# Patient Record
Sex: Male | Born: 1970
Health system: Southern US, Community
[De-identification: ages and names within clinical notes are randomized; demographics above are authoritative.]

## PROBLEM LIST (undated history)

## (undated) DIAGNOSIS — R9431 Abnormal electrocardiogram [ECG] [EKG]: Secondary | ICD-10-CM

## (undated) DIAGNOSIS — Z720 Tobacco use: Secondary | ICD-10-CM

## (undated) DIAGNOSIS — N529 Male erectile dysfunction, unspecified: Secondary | ICD-10-CM

## (undated) DIAGNOSIS — G4733 Obstructive sleep apnea (adult) (pediatric): Secondary | ICD-10-CM

## (undated) DIAGNOSIS — Z91199 Patient's noncompliance with other medical treatment and regimen due to unspecified reason: Secondary | ICD-10-CM

## (undated) DIAGNOSIS — I472 Ventricular tachycardia: Secondary | ICD-10-CM

## (undated) DIAGNOSIS — R12 Heartburn: Secondary | ICD-10-CM

## (undated) DIAGNOSIS — E119 Type 2 diabetes mellitus without complications: Secondary | ICD-10-CM

## (undated) DIAGNOSIS — I5032 Chronic diastolic (congestive) heart failure: Secondary | ICD-10-CM

## (undated) DIAGNOSIS — I10 Essential (primary) hypertension: Secondary | ICD-10-CM

## (undated) DIAGNOSIS — E039 Hypothyroidism, unspecified: Secondary | ICD-10-CM

## (undated) DIAGNOSIS — E785 Hyperlipidemia, unspecified: Secondary | ICD-10-CM

## (undated) DIAGNOSIS — I2510 Atherosclerotic heart disease of native coronary artery without angina pectoris: Secondary | ICD-10-CM

## (undated) DIAGNOSIS — I219 Acute myocardial infarction, unspecified: Secondary | ICD-10-CM

## (undated) DIAGNOSIS — Z9111 Patient's noncompliance with dietary regimen: Secondary | ICD-10-CM

## (undated) DIAGNOSIS — F1921 Other psychoactive substance dependence, in remission: Secondary | ICD-10-CM

## (undated) DIAGNOSIS — N189 Chronic kidney disease, unspecified: Secondary | ICD-10-CM

## (undated) DIAGNOSIS — E1169 Type 2 diabetes mellitus with other specified complication: Secondary | ICD-10-CM

## (undated) HISTORY — DX: Chronic diastolic (congestive) heart failure: I50.32

## (undated) HISTORY — DX: Obstructive sleep apnea (adult) (pediatric): G47.33

## (undated) HISTORY — DX: Hypothyroidism, unspecified: E03.9

## (undated) HISTORY — DX: Patient's noncompliance with other medical treatment and regimen due to unspecified reason: Z91.199

## (undated) HISTORY — DX: Patient's noncompliance with dietary regimen: Z91.11

## (undated) HISTORY — PX: APPENDECTOMY: SHX54

## (undated) HISTORY — PX: CORONARY STENT PLACEMENT: SHX1402

## (undated) HISTORY — DX: Type 2 diabetes mellitus without complications: E11.9

## (undated) HISTORY — DX: Morbid (severe) obesity due to excess calories: E66.01

## (undated) HISTORY — DX: Tobacco use: Z72.0

---

## 2009-07-30 DIAGNOSIS — I252 Old myocardial infarction: Secondary | ICD-10-CM | POA: Diagnosis present

## 2009-07-30 DIAGNOSIS — I219 Acute myocardial infarction, unspecified: Secondary | ICD-10-CM

## 2009-07-30 HISTORY — DX: Acute myocardial infarction, unspecified: I21.9

## 2015-09-15 ENCOUNTER — Inpatient Hospital Stay (HOSPITAL_BASED_OUTPATIENT_CLINIC_OR_DEPARTMENT_OTHER)
Admission: EM | Admit: 2015-09-15 | Discharge: 2015-09-17 | DRG: 247 | Disposition: A | Discharge status: Home / Self Care | Payer: Medicaid Other | Attending: Internal Medicine | Admitting: Internal Medicine

## 2015-09-15 ENCOUNTER — Other Ambulatory Visit: Payer: Self-pay

## 2015-09-15 ENCOUNTER — Emergency Department (HOSPITAL_BASED_OUTPATIENT_CLINIC_OR_DEPARTMENT_OTHER): Payer: Medicaid Other

## 2015-09-15 ENCOUNTER — Encounter (HOSPITAL_BASED_OUTPATIENT_CLINIC_OR_DEPARTMENT_OTHER): Payer: Self-pay | Admitting: *Deleted

## 2015-09-15 DIAGNOSIS — I25110 Atherosclerotic heart disease of native coronary artery with unstable angina pectoris: Secondary | ICD-10-CM | POA: Diagnosis present

## 2015-09-15 DIAGNOSIS — Y831 Surgical operation with implant of artificial internal device as the cause of abnormal reaction of the patient, or of later complication, without mention of misadventure at the time of the procedure: Secondary | ICD-10-CM | POA: Diagnosis present

## 2015-09-15 DIAGNOSIS — M1990 Unspecified osteoarthritis, unspecified site: Secondary | ICD-10-CM | POA: Diagnosis present

## 2015-09-15 DIAGNOSIS — Z955 Presence of coronary angioplasty implant and graft: Secondary | ICD-10-CM

## 2015-09-15 DIAGNOSIS — E785 Hyperlipidemia, unspecified: Secondary | ICD-10-CM | POA: Insufficient documentation

## 2015-09-15 DIAGNOSIS — E1169 Type 2 diabetes mellitus with other specified complication: Secondary | ICD-10-CM | POA: Diagnosis present

## 2015-09-15 DIAGNOSIS — Z7902 Long term (current) use of antithrombotics/antiplatelets: Secondary | ICD-10-CM

## 2015-09-15 DIAGNOSIS — I252 Old myocardial infarction: Secondary | ICD-10-CM | POA: Diagnosis present

## 2015-09-15 DIAGNOSIS — R0789 Other chest pain: Secondary | ICD-10-CM

## 2015-09-15 DIAGNOSIS — E119 Type 2 diabetes mellitus without complications: Secondary | ICD-10-CM

## 2015-09-15 DIAGNOSIS — R079 Chest pain, unspecified: Secondary | ICD-10-CM | POA: Diagnosis present

## 2015-09-15 DIAGNOSIS — I10 Essential (primary) hypertension: Secondary | ICD-10-CM | POA: Diagnosis present

## 2015-09-15 DIAGNOSIS — K219 Gastro-esophageal reflux disease without esophagitis: Secondary | ICD-10-CM | POA: Diagnosis present

## 2015-09-15 DIAGNOSIS — F17210 Nicotine dependence, cigarettes, uncomplicated: Secondary | ICD-10-CM | POA: Diagnosis present

## 2015-09-15 DIAGNOSIS — I209 Angina pectoris, unspecified: Secondary | ICD-10-CM

## 2015-09-15 DIAGNOSIS — Z7984 Long term (current) use of oral hypoglycemic drugs: Secondary | ICD-10-CM

## 2015-09-15 DIAGNOSIS — Z7982 Long term (current) use of aspirin: Secondary | ICD-10-CM

## 2015-09-15 DIAGNOSIS — Z6841 Body Mass Index (BMI) 40.0 and over, adult: Secondary | ICD-10-CM

## 2015-09-15 DIAGNOSIS — T82855A Stenosis of coronary artery stent, initial encounter: Principal | ICD-10-CM | POA: Diagnosis present

## 2015-09-15 HISTORY — DX: Essential (primary) hypertension: I10

## 2015-09-15 HISTORY — DX: Type 2 diabetes mellitus with other specified complication: E78.5

## 2015-09-15 HISTORY — DX: Acute myocardial infarction, unspecified: I21.9

## 2015-09-15 HISTORY — DX: Atherosclerotic heart disease of native coronary artery without angina pectoris: I25.10

## 2015-09-15 HISTORY — DX: Type 2 diabetes mellitus with other specified complication: E11.69

## 2015-09-15 LAB — BASIC METABOLIC PANEL
Anion gap: 12 (ref 5–15)
BUN: 11 mg/dL (ref 6–20)
CALCIUM: 8.8 mg/dL — AB (ref 8.9–10.3)
CO2: 21 mmol/L — ABNORMAL LOW (ref 22–32)
Chloride: 103 mmol/L (ref 101–111)
Creatinine, Ser: 0.77 mg/dL (ref 0.61–1.24)
GFR calc Af Amer: >60 mL/min (ref >60)
GLUCOSE: 374 mg/dL — AB (ref 65–99)
Potassium: 3.7 mmol/L (ref 3.5–5.1)
Sodium: 136 mmol/L (ref 135–145)

## 2015-09-15 LAB — CBC
HEMATOCRIT: 43.8 % (ref 39.0–52.0)
Hemoglobin: 14.9 g/dL (ref 13.0–17.0)
MCH: 31 pg (ref 26.0–34.0)
MCHC: 34 g/dL (ref 30.0–36.0)
MCV: 91.1 fL (ref 78.0–100.0)
Platelets: 209 10*3/uL (ref 150–400)
RBC: 4.81 MIL/uL (ref 4.22–5.81)
RDW: 13.6 % (ref 11.5–15.5)
WBC: 12.8 10*3/uL — ABNORMAL HIGH (ref 4.0–10.5)

## 2015-09-15 LAB — GLUCOSE, CAPILLARY: GLUCOSE-CAPILLARY: 296 mg/dL — AB (ref 65–99)

## 2015-09-15 LAB — TROPONIN I

## 2015-09-15 MED ORDER — PNEUMOCOCCAL VAC POLYVALENT 25 MCG/0.5ML IJ INJ
0.5000 mL | INJECTION | INTRAMUSCULAR | Status: AC
  Administered 2015-09-16: 0.5 mL via INTRAMUSCULAR
  Filled 2015-09-15: qty 0.5

## 2015-09-15 MED ORDER — INFLUENZA VAC SPLIT QUAD 0.5 ML IM SUSY
0.5000 mL | PREFILLED_SYRINGE | INTRAMUSCULAR | Status: AC
  Administered 2015-09-16: 0.5 mL via INTRAMUSCULAR
  Filled 2015-09-15: qty 0.5

## 2015-09-15 MED ORDER — NITROGLYCERIN 0.4 MG SL SUBL
0.4000 mg | SUBLINGUAL_TABLET | SUBLINGUAL | Status: DC | PRN
  Administered 2015-09-15: 0.4 mg via SUBLINGUAL
  Filled 2015-09-15: qty 1

## 2015-09-15 MED ORDER — ASPIRIN 325 MG PO TABS
325.0000 mg | ORAL_TABLET | Freq: Once | ORAL | Status: DC
  Filled 2015-09-15: qty 1

## 2015-09-15 MED ORDER — ASPIRIN 81 MG PO CHEW
324.0000 mg | CHEWABLE_TABLET | Freq: Once | ORAL | Status: AC
  Administered 2015-09-15: 324 mg via ORAL
  Filled 2015-09-15: qty 4

## 2015-09-15 NOTE — ED Notes (Signed)
MD at bedside. 

## 2015-09-15 NOTE — ED Notes (Signed)
Pt reports chest pain with onset approx 25 mins ago. Pt has hx of MI x 3 and has 6 stents. States pain is consistent with past heart attacks

## 2015-09-15 NOTE — Progress Notes (Signed)
[facility] Discussed case with Dr. Clayborne DanaMesner. [name] is a 45 year old male with a past medical history of HTN, HLD, DM, CAD s/p PCI; who presents with acute onset of left-sided chest pain/pressure that waxed and waned. Initial EKG showing sinus tachycardia with nonspecific ST changes,  troponin negative, and patient with abnormal chest x-ray. Admitted to a telemetry bed for further evaluation vitals otherwise stable at this time.

## 2015-09-16 ENCOUNTER — Encounter (HOSPITAL_COMMUNITY): Payer: Self-pay | Admitting: *Deleted

## 2015-09-16 ENCOUNTER — Encounter (HOSPITAL_COMMUNITY)
Admission: EM | Disposition: A | Discharge status: Home / Self Care | Payer: Self-pay | Source: Home / Self Care | Attending: Internal Medicine

## 2015-09-16 DIAGNOSIS — I25110 Atherosclerotic heart disease of native coronary artery with unstable angina pectoris: Secondary | ICD-10-CM

## 2015-09-16 DIAGNOSIS — E785 Hyperlipidemia, unspecified: Secondary | ICD-10-CM | POA: Insufficient documentation

## 2015-09-16 DIAGNOSIS — R079 Chest pain, unspecified: Secondary | ICD-10-CM

## 2015-09-16 DIAGNOSIS — E1169 Type 2 diabetes mellitus with other specified complication: Secondary | ICD-10-CM | POA: Diagnosis present

## 2015-09-16 DIAGNOSIS — I10 Essential (primary) hypertension: Secondary | ICD-10-CM | POA: Diagnosis present

## 2015-09-16 HISTORY — PX: CARDIAC CATHETERIZATION: SHX172

## 2015-09-16 LAB — GLUCOSE, CAPILLARY
GLUCOSE-CAPILLARY: 211 mg/dL — AB (ref 65–99)
GLUCOSE-CAPILLARY: 131 mg/dL — AB (ref 65–99)
GLUCOSE-CAPILLARY: 107 mg/dL — AB (ref 65–99)
Glucose-Capillary: 235 mg/dL — ABNORMAL HIGH (ref 65–99)

## 2015-09-16 LAB — BASIC METABOLIC PANEL
Anion gap: 11 (ref 5–15)
BUN: 9 mg/dL (ref 6–20)
CHLORIDE: 106 mmol/L (ref 101–111)
CO2: 23 mmol/L (ref 22–32)
Calcium: 8.6 mg/dL — ABNORMAL LOW (ref 8.9–10.3)
Creatinine, Ser: 0.71 mg/dL (ref 0.61–1.24)
GFR calc Af Amer: >60 mL/min (ref >60)
GFR calc non Af Amer: >60 mL/min (ref >60)
Glucose, Bld: 275 mg/dL — ABNORMAL HIGH (ref 65–99)
POTASSIUM: 3.9 mmol/L (ref 3.5–5.1)
SODIUM: 140 mmol/L (ref 135–145)

## 2015-09-16 LAB — CBC
HEMATOCRIT: 40.2 % (ref 39.0–52.0)
Hemoglobin: 13.7 g/dL (ref 13.0–17.0)
MCH: 31.4 pg (ref 26.0–34.0)
MCHC: 34.1 g/dL (ref 30.0–36.0)
MCV: 92.2 fL (ref 78.0–100.0)
Platelets: 170 10*3/uL (ref 150–400)
RBC: 4.36 MIL/uL (ref 4.22–5.81)
RDW: 13.5 % (ref 11.5–15.5)
WBC: 11.4 10*3/uL — AB (ref 4.0–10.5)

## 2015-09-16 LAB — LIPID PANEL
CHOL/HDL RATIO: 3.9 ratio
CHOLESTEROL: 163 mg/dL (ref 0–200)
HDL: 42 mg/dL (ref >40)
LDL Cholesterol: 87 mg/dL (ref 0–99)
Triglycerides: 168 mg/dL — ABNORMAL HIGH (ref <150)
VLDL: 34 mg/dL (ref 0–40)

## 2015-09-16 LAB — PROTIME-INR
INR: 1.02 (ref 0.00–1.49)
Prothrombin Time: 13.6 seconds (ref 11.6–15.2)

## 2015-09-16 LAB — TROPONIN I: Troponin I: 0.03 ng/mL (ref <0.031)

## 2015-09-16 SURGERY — LEFT HEART CATH AND CORONARY ANGIOGRAPHY

## 2015-09-16 MED ORDER — SODIUM CHLORIDE 0.9 % IV SOLN
1.7500 mg/kg/h | INTRAVENOUS | Status: AC
  Administered 2015-09-16: 17:00:00 1.75 mg/kg/h via INTRAVENOUS
  Filled 2015-09-16: qty 250

## 2015-09-16 MED ORDER — NITROGLYCERIN 1 MG/10 ML FOR IR/CATH LAB
INTRA_ARTERIAL | Status: AC
  Filled 2015-09-16: qty 10

## 2015-09-16 MED ORDER — ALUM & MAG HYDROXIDE-SIMETH 200-200-20 MG/5ML PO SUSP
30.0000 mL | Freq: Four times a day (QID) | ORAL | Status: DC | PRN
Start: 2015-09-16 — End: 2015-09-17
  Administered 2015-09-16: 30 mL via ORAL
  Filled 2015-09-16: qty 30

## 2015-09-16 MED ORDER — NICOTINE 21 MG/24HR TD PT24
21.0000 mg | MEDICATED_PATCH | Freq: Every day | TRANSDERMAL | Status: DC
  Administered 2015-09-16 – 2015-09-17 (×2): 21 mg via TRANSDERMAL
  Filled 2015-09-16 (×2): qty 1

## 2015-09-16 MED ORDER — CLOPIDOGREL BISULFATE 75 MG PO TABS
75.0000 mg | ORAL_TABLET | Freq: Every day | ORAL | Status: DC
  Administered 2015-09-16: 75 mg via ORAL
  Filled 2015-09-16: qty 1

## 2015-09-16 MED ORDER — ONDANSETRON HCL 4 MG/2ML IJ SOLN: 4.0000 mg | Freq: Four times a day (QID) | INTRAMUSCULAR | Status: DC | PRN

## 2015-09-16 MED ORDER — HYDRALAZINE HCL 20 MG/ML IJ SOLN
INTRAMUSCULAR | Status: AC
  Filled 2015-09-16: qty 1

## 2015-09-16 MED ORDER — ATROPINE SULFATE 0.1 MG/ML IJ SOLN
INTRAMUSCULAR | Status: AC
  Filled 2015-09-16: qty 10

## 2015-09-16 MED ORDER — SODIUM CHLORIDE 0.9 % IV SOLN
250.0000 mg | INTRAVENOUS | Status: DC | PRN
  Administered 2015-09-16 (×2): 1.75 mg/kg/h via INTRAVENOUS

## 2015-09-16 MED ORDER — ASPIRIN 81 MG PO CHEW
324.0000 mg | CHEWABLE_TABLET | ORAL | Status: AC
  Administered 2015-09-16: 324 mg via ORAL
  Filled 2015-09-16: qty 4

## 2015-09-16 MED ORDER — ONDANSETRON HCL 4 MG/2ML IJ SOLN: 4.0000 mg | Freq: Four times a day (QID) | INTRAMUSCULAR | Status: DC

## 2015-09-16 MED ORDER — NITROGLYCERIN IN D5W 200-5 MCG/ML-% IV SOLN
INTRAVENOUS | Status: DC | PRN
  Administered 2015-09-16: 10 ug/min via INTRAVENOUS

## 2015-09-16 MED ORDER — NITROGLYCERIN IN D5W 200-5 MCG/ML-% IV SOLN
2.0000 ug/min | INTRAVENOUS | Status: DC
  Administered 2015-09-16: 20 ug/min via INTRAVENOUS

## 2015-09-16 MED ORDER — METOPROLOL TARTRATE 25 MG PO TABS
50.0000 mg | ORAL_TABLET | Freq: Two times a day (BID) | ORAL | Status: DC
  Administered 2015-09-16 – 2015-09-17 (×4): 50 mg via ORAL
  Filled 2015-09-16: qty 1
  Filled 2015-09-16: qty 2
  Filled 2015-09-16: qty 1
  Filled 2015-09-16: qty 2

## 2015-09-16 MED ORDER — SODIUM CHLORIDE 0.9 % WEIGHT BASED INFUSION
3.0000 mL/kg/h | INTRAVENOUS | Status: DC
  Administered 2015-09-16: 3 mL/kg/h via INTRAVENOUS

## 2015-09-16 MED ORDER — HYDRALAZINE HCL 20 MG/ML IJ SOLN
10.0000 mg | INTRAMUSCULAR | Status: DC | PRN
  Administered 2015-09-16: 10 mg via INTRAVENOUS
  Filled 2015-09-16: qty 1

## 2015-09-16 MED ORDER — CLOPIDOGREL BISULFATE 75 MG PO TABS
75.0000 mg | ORAL_TABLET | Freq: Every day | ORAL | Status: DC
  Administered 2015-09-17: 75 mg via ORAL
  Filled 2015-09-16: qty 1

## 2015-09-16 MED ORDER — INSULIN GLARGINE 100 UNIT/ML ~~LOC~~ SOLN
10.0000 [IU] | Freq: Every day | SUBCUTANEOUS | Status: DC
Start: 2015-09-16 — End: 2015-09-17
  Administered 2015-09-17: 10 [IU] via SUBCUTANEOUS
  Filled 2015-09-16 (×2): qty 0.1

## 2015-09-16 MED ORDER — ENOXAPARIN SODIUM 40 MG/0.4ML ~~LOC~~ SOLN
40.0000 mg | Freq: Every day | SUBCUTANEOUS | Status: DC
  Administered 2015-09-17: 09:00:00 40 mg via SUBCUTANEOUS
  Filled 2015-09-16: qty 0.4

## 2015-09-16 MED ORDER — LIDOCAINE HCL (PF) 1 % IJ SOLN
INTRAMUSCULAR | Status: AC
  Filled 2015-09-16: qty 30

## 2015-09-16 MED ORDER — ANGIOPLASTY BOOK
Freq: Once | Status: AC
  Administered 2015-09-16: 20:00:00
  Filled 2015-09-16: qty 1

## 2015-09-16 MED ORDER — CLOPIDOGREL BISULFATE 300 MG PO TABS
ORAL_TABLET | ORAL | Status: DC | PRN
  Administered 2015-09-16: 300 mg via ORAL

## 2015-09-16 MED ORDER — HEPARIN (PORCINE) IN NACL 2-0.9 UNIT/ML-% IJ SOLN
INTRAMUSCULAR | Status: AC
  Filled 2015-09-16: qty 1500

## 2015-09-16 MED ORDER — HYDRALAZINE HCL 20 MG/ML IJ SOLN
INTRAMUSCULAR | Status: DC | PRN
Start: 2015-09-16 — End: 2015-09-16
  Administered 2015-09-16: 10 mg via INTRAVENOUS

## 2015-09-16 MED ORDER — SODIUM CHLORIDE 0.9 % IV SOLN: 250.0000 mL | INTRAVENOUS | Status: DC | PRN

## 2015-09-16 MED ORDER — ACETAMINOPHEN 325 MG PO TABS
650.0000 mg | ORAL_TABLET | ORAL | Status: DC | PRN
  Administered 2015-09-16 (×2): 650 mg via ORAL
  Filled 2015-09-16 (×2): qty 2

## 2015-09-16 MED ORDER — SODIUM CHLORIDE 0.9% FLUSH: 3.0000 mL | INTRAVENOUS | Status: DC | PRN

## 2015-09-16 MED ORDER — NITROGLYCERIN IN D5W 200-5 MCG/ML-% IV SOLN
INTRAVENOUS | Status: AC
Start: 2015-09-16 — End: 2015-09-16
  Filled 2015-09-16: qty 250

## 2015-09-16 MED ORDER — CLOPIDOGREL BISULFATE 300 MG PO TABS
ORAL_TABLET | ORAL | Status: AC
  Filled 2015-09-16: qty 1

## 2015-09-16 MED ORDER — BIVALIRUDIN BOLUS VIA INFUSION - CUPID
INTRAVENOUS | Status: DC | PRN
Start: 2015-09-16 — End: 2015-09-16
  Administered 2015-09-16: 83.325 mg via INTRAVENOUS

## 2015-09-16 MED ORDER — LISINOPRIL 5 MG PO TABS
5.0000 mg | ORAL_TABLET | Freq: Every day | ORAL | Status: DC
  Administered 2015-09-16: 5 mg via ORAL
  Filled 2015-09-16: qty 1

## 2015-09-16 MED ORDER — SODIUM CHLORIDE 0.9 % WEIGHT BASED INFUSION: 1.0000 mL/kg/h | INTRAVENOUS | Status: AC

## 2015-09-16 MED ORDER — ASPIRIN 81 MG PO CHEW
81.0000 mg | CHEWABLE_TABLET | Freq: Every day | ORAL | Status: DC
  Administered 2015-09-17: 81 mg via ORAL
  Filled 2015-09-16: qty 1

## 2015-09-16 MED ORDER — SODIUM CHLORIDE 0.9 % WEIGHT BASED INFUSION
1.0000 mL/kg/h | INTRAVENOUS | Status: DC
  Administered 2015-09-16: 1 mL/kg/h via INTRAVENOUS

## 2015-09-16 MED ORDER — BIVALIRUDIN 250 MG IV SOLR
INTRAVENOUS | Status: AC
  Filled 2015-09-16: qty 250

## 2015-09-16 MED ORDER — ASPIRIN EC 81 MG PO TBEC
81.0000 mg | DELAYED_RELEASE_TABLET | Freq: Every day | ORAL | Status: DC
  Administered 2015-09-16: 81 mg via ORAL
  Filled 2015-09-16: qty 1

## 2015-09-16 MED ORDER — LIDOCAINE HCL (PF) 1 % IJ SOLN
INTRAMUSCULAR | Status: DC | PRN
  Administered 2015-09-16: 25 mL

## 2015-09-16 MED ORDER — ISOSORBIDE MONONITRATE ER 60 MG PO TB24
60.0000 mg | ORAL_TABLET | Freq: Every day | ORAL | Status: DC
  Administered 2015-09-16 – 2015-09-17 (×2): 60 mg via ORAL
  Filled 2015-09-16 (×2): qty 1

## 2015-09-16 MED ORDER — HEPARIN (PORCINE) IN NACL 2-0.9 UNIT/ML-% IJ SOLN
INTRAMUSCULAR | Status: DC | PRN
  Administered 2015-09-16: 1500 mL

## 2015-09-16 MED ORDER — VERAPAMIL HCL 2.5 MG/ML IV SOLN
INTRAVENOUS | Status: AC
  Filled 2015-09-16: qty 2

## 2015-09-16 MED ORDER — SODIUM CHLORIDE 0.9% FLUSH: 3.0000 mL | Freq: Two times a day (BID) | INTRAVENOUS | Status: DC

## 2015-09-16 MED ORDER — MORPHINE SULFATE (PF) 2 MG/ML IV SOLN
2.0000 mg | INTRAVENOUS | Status: DC | PRN
  Administered 2015-09-16: 2 mg via INTRAVENOUS
  Filled 2015-09-16: qty 1

## 2015-09-16 MED ORDER — INSULIN ASPART 100 UNIT/ML ~~LOC~~ SOLN
0.0000 [IU] | Freq: Three times a day (TID) | SUBCUTANEOUS | Status: DC
Start: 2015-09-16 — End: 2015-09-17
  Administered 2015-09-16 (×2): 3 [IU] via SUBCUTANEOUS
  Administered 2015-09-16: 19:00:00 1 [IU] via SUBCUTANEOUS
  Administered 2015-09-17: 3 [IU] via SUBCUTANEOUS
  Administered 2015-09-17: 5 [IU] via SUBCUTANEOUS

## 2015-09-16 MED ORDER — SODIUM CHLORIDE 0.9% FLUSH
3.0000 mL | Freq: Two times a day (BID) | INTRAVENOUS | Status: DC
  Administered 2015-09-16 – 2015-09-17 (×3): 3 mL via INTRAVENOUS

## 2015-09-16 MED ORDER — IOHEXOL 350 MG/ML SOLN
INTRAVENOUS | Status: DC | PRN
  Administered 2015-09-16: 220 mL via INTRA_ARTERIAL

## 2015-09-16 MED ORDER — ATORVASTATIN CALCIUM 10 MG PO TABS
10.0000 mg | ORAL_TABLET | Freq: Every day | ORAL | Status: DC
  Administered 2015-09-16: 10 mg via ORAL
  Filled 2015-09-16: qty 1

## 2015-09-16 MED ORDER — LIVING WELL WITH DIABETES BOOK
Freq: Once | Status: AC
  Administered 2015-09-16: 20:00:00
  Filled 2015-09-16: qty 1

## 2015-09-16 SURGICAL SUPPLY — 25 items
BALLN ANGIOSCULPT RX 2.0X10 (BALLOONS) ×2
BALLN EMERGE MR 2.0X12 (BALLOONS) ×2
BALLN ~~LOC~~ TREK RX 2.75X15 (BALLOONS) ×2
BALLN ~~LOC~~ TREK RX 3.5X15 (BALLOONS) ×2
BALLOON ANGIOSCULPT RX 2.0X10 (BALLOONS) ×1 IMPLANT
BALLOON EMERGE MR 2.0X12 (BALLOONS) ×1 IMPLANT
BALLOON ~~LOC~~ TREK RX 2.75X15 (BALLOONS) ×1 IMPLANT
BALLOON ~~LOC~~ TREK RX 3.5X15 (BALLOONS) ×1 IMPLANT
CATH INFINITI 5FR MULTPACK ANG (CATHETERS) ×2 IMPLANT
CATH VISTA GUIDE 6FR JR4 (CATHETERS) ×2 IMPLANT
CATH VISTA GUIDE 6FR XBLAD3.5 (CATHETERS) ×2 IMPLANT
GLIDESHEATH SLEND A-KIT 6F 22G (SHEATH) ×2 IMPLANT
KIT ENCORE 26 ADVANTAGE (KITS) ×2 IMPLANT
KIT HEART LEFT (KITS) ×2 IMPLANT
PACK CARDIAC CATHETERIZATION (CUSTOM PROCEDURE TRAY) ×2 IMPLANT
SHEATH PINNACLE 5F 10CM (SHEATH) ×4 IMPLANT
SHEATH PINNACLE 6F 10CM (SHEATH) ×2 IMPLANT
STENT SYNERGY DES 2.5X20 (Permanent Stent) ×2 IMPLANT
STENT XIENCE ALPINE RX 3.25X18 (Permanent Stent) ×2 IMPLANT
SYR MEDRAD MARK V 150ML (SYRINGE) ×2 IMPLANT
TRANSDUCER W/STOPCOCK (MISCELLANEOUS) ×2 IMPLANT
TUBING CIL FLEX 10 FLL-RA (TUBING) ×2 IMPLANT
WIRE ASAHI PROWATER 180CM (WIRE) ×2 IMPLANT
WIRE EMERALD 3MM-J .035X150CM (WIRE) ×4 IMPLANT
WIRE SAFE-T 1.5MM-J .035X260CM (WIRE) ×2 IMPLANT

## 2015-09-16 NOTE — Progress Notes (Signed)
Paged fellow Onalee HuaAlvarez regarding pt home arthritis medication, states to hold med tonight and give tylenol; will address in the morning. Informed pt c/o right hand pain; pt states r/t arthritis. Radial site checked. Level 0. Dressing dry and intact.

## 2015-09-16 NOTE — Interval H&P Note (Signed)
Cath Lab Visit (complete for each Cath Lab visit)  Clinical Evaluation Leading to the Procedure:   ACS: Yes.    Non-ACS:    Anginal Classification: CCS III  Anti-ischemic medical therapy: Minimal Therapy (1 class of medications)  Non-Invasive Test Results: No non-invasive testing performed  Prior CABG: No previous CABG      History and Physical Interval Note:  09/16/2015 1:55 PM  Burna MortimerJames Jollie  has presented today for surgery, with the diagnosis of cp  The various methods of treatment have been discussed with the patient and family. After consideration of risks, benefits and other options for treatment, the patient has consented to  Procedure(s): Left Heart Cath and Coronary Angiography (N/A) as a surgical intervention .  The patient's history has been reviewed, patient examined, no change in status, stable for surgery.  I have reviewed the patient's chart and labs.  Questions were answered to the patient's satisfaction.     Nanetta BattyBerry, Estle Huguley

## 2015-09-16 NOTE — Progress Notes (Signed)
[facility]                                                                              Patient Demographics  Marc Walker, is a 45 y.o. male, DOB - 04/20/1971, ZOX:[id number]  Admit date - 09/15/2015   Admitting Physician Clydie Braunondell A Smith, MD  Outpatient Primary MD for the patient is No PCP Per Patient  LOS -   days    Chief Complaint  Patient presents with  . Chest Pain       Brief HPI   Patient is a 45 year old male with CAD, MI last in September 2015, per patient. PCI, 6 stents, hypertension hyperlipidemia, diabetes, tobacco abuse presented with chest pain. Patient aborted as chest pain substernal, exertional, intermittent for a week. No recent cardiac workup.   Assessment & Plan    Active Problems: Chest pain, concerning for angina - Cardiac enzymes negative so far, currently chest pain resolved - Continue aspirin, statin, Imdur, beta blocker, Plavix - Cardiology consulted, likely needs cardiac cath  Hypertension - Continue Imdur, lisinopril, metoprolol  Hyperlipidemia - Lipid panel showed LDL 87, triglycerides 160, continue statin  Diabetes mellitus type 2 - Uncontrolled, obtain hemoglobin A1c - For now place on Lantus 10 units, continue sliding scale insulin, patient is NPO  Tobacco abuse Patient counseled strongly on tobacco cessation, placed on nicotine patch   Code Status: Full CODE STATUS  Family Communication: Discussed in detail with the patient, all imaging results, lab results explained to the patient    Disposition Plan:  Time Spent in minutes   25 minutes  Procedures    Consults   cardiology  DVT Prophylaxis  Lovenox   Medications  Scheduled Meds: . aspirin EC  81 mg Oral Daily  . atorvastatin  10 mg Oral Daily  . clopidogrel  75 mg Oral Daily  . enoxaparin (LOVENOX) injection  40 mg Subcutaneous Daily  . insulin aspart  0-9 Units Subcutaneous TID WC  . isosorbide mononitrate  60 mg Oral Daily  . lisinopril   5 mg Oral Daily  . metoprolol  50 mg Oral BID  . nicotine  21 mg Transdermal Daily  . sodium chloride flush  3 mL Intravenous Q12H   Continuous Infusions: . sodium chloride     PRN Meds:.sodium chloride, alum & mag hydroxide-simeth, nitroGLYCERIN, ondansetron (ZOFRAN) IV, sodium chloride flush   Antibiotics   Anti-infectives    None        Subjective:   Marc Walker was seen and examined today.  Patient denies dizziness, chest pain, shortness of breath, abdominal pain, N/V/D/C, new weakness, numbess, tingling. No acute events overnight.    Objective:   Filed Vitals:   09/15/15 2321 09/16/15 0000 09/16/15 0500 09/16/15 0751  BP: 185/86 165/83 146/80 148/92  Pulse: 84  64 59  Temp: 98.7 F (37.1 C)  98.2 F (36.8 C) 98 F (36.7 C)  TempSrc: Oral   Oral  Resp: 18  24 15   Height: 5\' 5"  (1.651 m)     Weight: 112.265 kg (247 lb 8 oz)  111.131 kg (245 lb)   SpO2: 97%  94% 94%  Intake/Output Summary (Last 24 hours) at 09/16/15 1142 Last data filed at 09/16/15 0841  Gross per 24 hour  Intake    240 ml  Output    400 ml  Net   -160 ml     Wt Readings from Last 3 Encounters:  09/16/15 111.131 kg (245 lb)     Exam  General: Alert and oriented x 3, NAD  HEENT:  PERRLA, EOMI, Anicteric Sclera, mucous membranes moist.   Neck: Supple, no JVD, no masses  CVS: S1 S2 auscultated, no rubs, murmurs or gallops. Regular rate and rhythm.  Respiratory: Clear to auscultation bilaterally, no wheezing, rales or rhonchi  Abdomen: Soft, nontender, nondistended, + bowel sounds  Ext: no cyanosis clubbing or edema  Neuro: AAOx3, Cr N's II- XII. Strength 5/5 upper and lower extremities bilaterally  Skin: No rashes  Psych: Normal affect and demeanor, alert and oriented x3    Data Reviewed:  I have personally reviewed following labs and imaging studies  Micro Results No results found for this or any previous visit (from the past 240 hour(s)).  Radiology Reports Dg  Chest Portable 1 View  09/15/2015  CLINICAL DATA:  sternal chest pain history of myocardial infarction EXAM: PORTABLE CHEST 1 VIEW COMPARISON:  None. FINDINGS: Mild cardiac enlargement. Vascular pattern normal. No consolidation or effusion. Mild perihilar peribronchial wall thickening. Mild interstitial prominence. Small nodular opacity right lung base could represent discoid atelectasis. IMPRESSION: Mild cardiac silhouette enlargement. Mild diffuse interstitial prominence without pulmonary venous hypertension. No definite pulmonary edema. Possible mild interstitial lung disease. Mild nodular opacity right lung base possibly discoid atelectasis given young age of the patient. Consider short-term follow-up PA and lateral chest radiograph in 2-4 weeks to ensure resolution. Electronically Signed   By: Esperanza Heir M.D.   On: 09/15/2015 20:22    CBC  Recent Labs Lab 09/15/15 1925 09/16/15 0637  WBC 12.8* 11.4*  HGB 14.9 13.7  HCT 43.8 40.2  PLT 209 170  MCV 91.1 92.2  MCH 31.0 31.4  MCHC 34.0 34.1  RDW 13.6 13.5    Chemistries   Recent Labs Lab 09/15/15 1925 09/16/15 0637  NA 136 140  K 3.7 3.9  CL 103 106  CO2 21* 23  GLUCOSE 374* 275*  BUN 11 9  CREATININE 0.77 0.71  CALCIUM 8.8* 8.6*   ------------------------------------------------------------------------------------------------------------------ estimated creatinine clearance is 132.7 mL/min (by C-G formula based on Cr of 0.71). ------------------------------------------------------------------------------------------------------------------ No results for input(s): HGBA1C in the last 72 hours. ------------------------------------------------------------------------------------------------------------------  Recent Labs  09/16/15 0930  CHOL 163  HDL 42  LDLCALC 87  TRIG 168*  CHOLHDL 3.9   ------------------------------------------------------------------------------------------------------------------ No  results for input(s): TSH, T4TOTAL, T3FREE, THYROIDAB in the last 72 hours.  Invalid input(s): FREET3 ------------------------------------------------------------------------------------------------------------------ No results for input(s): VITAMINB12, FOLATE, FERRITIN, TIBC, IRON, RETICCTPCT in the last 72 hours.  Coagulation profile  Recent Labs Lab 09/16/15 1030  INR 1.02    No results for input(s): DDIMER in the last 72 hours.  Cardiac Enzymes  Recent Labs Lab 09/15/15 1925 09/16/15 0003 09/16/15 0613  TROPONINI <0.03 0.03 <0.03   ------------------------------------------------------------------------------------------------------------------ Invalid input(s): POCBNP   Recent Labs  09/15/15 2324 09/16/15 0750  GLUCAP 296* 235*     Oretta Berkland M.D. [facility] 09/16/2015, 11:42 AM  Pager: [phone] Between 7am to 7pm - call Pager - [phone]  After 7pm go to [email or web address] - password [email or web address]  Call night coverage person covering after 7pm

## 2015-09-16 NOTE — Progress Notes (Addendum)
Inpatient Diabetes Program Recommendations  AACE/ADA: New Consensus Statement on Inpatient Glycemic Control (2015)  Target Ranges:  Prepandial:   less than 140 mg/dL      Peak postprandial:   less than 180 mg/dL (1-2 hours)      Critically ill patients:  140 - 180 mg/dL  Results for Burna MortimerSTANLEY, Marc Walker (MRN [id number]) as of 09/16/2015 09:33  Ref. Range 09/15/2015 23:24 09/16/2015 07:50  Glucose-Capillary Latest Ref Range: 65-99 mg/dL 409296 (H) 811235 (H)   Review of Glycemic Control  Diabetes history: DM2 Outpatient Diabetes medications: Metformin 500 mg bid + Glucotrol 5 mg. q d. Current orders for Inpatient glycemic control: Novolog SSI (sensitive) 0-9 tid ac meals.  Inpatient Diabetes Program Recommendations:  Noted elevated blood sugars with FCBG 235. Please consider adding Lantus 20 units insulin daily, increase SSI to Moderate 0-15, and A1c order.  Thank you, Billy FischerJudy E. Davy Westmoreland, RN, MSN, CDE Inpatient Glycemic Control Team Team Pager [phone] (8am-5pm) 09/16/2015 9:40 AM

## 2015-09-16 NOTE — Consult Note (Signed)
    CARDIOLOGY CONSULT NOTE   Patient ID: Marc Walker MRN: [id number] DOB/AGE: 47/19/1972 44 y.o.  Admit date: 09/15/2015  Primary Physician   No PCP Per Patient Primary Cardiologist   New Reason for Consultation   Chest pain  HPI:Zayin Will is a 44 y.o. year old male with a history of MI and multiple stents, DM, HTN, HLD, tobacco use. Last stent in 2015.   For the last 2 weeks or so, he has been getting exertional chest pain. Symptoms relieved by rest. When walking on flat ground yesterday, he developed chest pain, worse than usual because he did not stop walking. 9/10 at the worst. Slight SOB, flushed, no diaphoresis, nausea, no vomiting.   Girlfriend was with him, she made him go to the ER. Chest pain has not returned. Has had GERD and OA pain. Symptoms like previous MI.    Recently got a job, before that, was out of medications for over a month because of no money. He takes his medication when he can afford it.   Past Medical History  Diagnosis Date  . MI (myocardial infarction) (HCC) 07/2009    [facility] in [place], [place] for last 2, 1st one in [place], [place]; total of 6 stents, last one 02/2014   . Coronary artery disease   . Diabetes mellitus without complication (HCC)   . Hypertension   . Hyperlipidemia associated with type 2 diabetes mellitus (HCC)      Past Surgical History  Procedure Laterality Date  . Coronary stent placement      No Known Allergies  I have reviewed the patient's current medications . aspirin EC  81 mg Oral Daily  . atorvastatin  10 mg Oral Daily  . clopidogrel  75 mg Oral Daily  . enoxaparin (LOVENOX) injection  40 mg Subcutaneous Daily  . insulin aspart  0-9 Units Subcutaneous TID WC  . isosorbide mononitrate  60 mg Oral Daily  . lisinopril  5 mg Oral Daily  . metoprolol  50 mg Oral BID  . nicotine  21 mg Transdermal Daily     alum & mag hydroxide-simeth, nitroGLYCERIN, ondansetron (ZOFRAN) IV  Prior to Admission  medications   Medication Sig Start Date End Date Taking? Authorizing Provider  aspirin 81 MG tablet Take 81 mg by mouth daily.   Yes Historical Provider, MD  atorvastatin (LIPITOR) 10 MG tablet Take 10 mg by mouth daily.   Yes Historical Provider, MD  clopidogrel (PLAVIX) 75 MG tablet Take 75 mg by mouth daily.   Yes Historical Provider, MD  diclofenac (VOLTAREN) 75 MG EC tablet Take 75 mg by mouth 2 (two) times daily.   Yes Historical Provider, MD  glipiZIDE (GLUCOTROL) 5 MG tablet Take by mouth daily before breakfast.   Yes Historical Provider, MD  isosorbide mononitrate (IMDUR) 60 MG 24 hr tablet Take 60 mg by mouth daily.   Yes Historical Provider, MD  lisinopril (PRINIVIL,ZESTRIL) 5 MG tablet Take 5 mg by mouth daily.   Yes Historical Provider, MD  metFORMIN (GLUCOPHAGE) 500 MG tablet Take 500 mg by mouth 2 (two) times daily with a meal.    Yes Historical Provider, MD  metoprolol (LOPRESSOR) 50 MG tablet Take 50 mg by mouth 2 (two) times daily.   Yes Historical Provider, MD     Social History   Social History  . Marital Status: Single    Spouse Name: N/A  . Number of Children: N/A  . Years of Education: N/A     Occupational History  . Warehouse worker    Social History Main Topics  . Smoking status: Current Every Day Smoker -- 1.00 packs/day for 33 years    Types: Cigarettes  . Smokeless tobacco: Never Used  . Alcohol Use: No  . Drug Use: No  . Sexual Activity: Not on file   Other Topics Concern  . Not on file   Social History Narrative   Adopted. Very little info on biological parents, ?diabetes.    No family status information on file.   Family History  Problem Relation Age of Onset  . Diabetes Maternal Grandmother      ROS:  Full 14 point review of systems complete and found to be negative unless listed above.  Physical Exam: Blood pressure 148/92, pulse 59, temperature 98 F (36.7 C), temperature source Oral, resp. rate 15, height 5' 5" (1.651 m), weight 245 lb  (111.131 kg), SpO2 94 %.  General: Well developed, well nourished, male in no acute distress Head: Eyes PERRLA, No xanthomas.   Normocephalic and atraumatic, oropharynx without edema or exudate. Dentition: fair Lungs: few rales, good air exchange Heart: HRRR S1 S2, no rub/gallop, no murmur. pulses are 2+ all 4 extrem.   Neck: No carotid bruits. No lymphadenopathy.  JVD not elevated. Abdomen: Bowel sounds present, abdomen soft and non-tender without masses or hernias noted. Msk:  No spine or cva tenderness. No weakness, no joint deformities or effusions. Extremities: No clubbing or cyanosis. No edema.  Neuro: Alert and oriented X 3. No focal deficits noted. Psych:  Good affect, responds appropriately Skin: No rashes or lesions noted.  Labs:   Lab Results  Component Value Date   WBC 11.4* 09/16/2015   HGB 13.7 09/16/2015   HCT 40.2 09/16/2015   MCV 92.2 09/16/2015   PLT 170 09/16/2015     Recent Labs Lab 09/16/15 0637  NA 140  K 3.9  CL 106  CO2 23  BUN 9  CREATININE 0.71  CALCIUM 8.6*  GLUCOSE 275*   No results found for: MG  Recent Labs  09/15/15 1925 09/16/15 0003 09/16/15 0613  TROPONINI <0.03 0.03 <0.03   Echo: ordered  ECG:  SR, no acute changes  Radiology:  Dg Chest Portable 1 View 09/15/2015  CLINICAL DATA:  sternal chest pain history of myocardial infarction EXAM: PORTABLE CHEST 1 VIEW COMPARISON:  None. FINDINGS: Mild cardiac enlargement. Vascular pattern normal. No consolidation or effusion. Mild perihilar peribronchial wall thickening. Mild interstitial prominence. Small nodular opacity right lung base could represent discoid atelectasis. IMPRESSION: Mild cardiac silhouette enlargement. Mild diffuse interstitial prominence without pulmonary venous hypertension. No definite pulmonary edema. Possible mild interstitial lung disease. Mild nodular opacity right lung base possibly discoid atelectasis given young age of the patient. Consider short-term follow-up PA  and lateral chest radiograph in 2-4 weeks to ensure resolution. Electronically Signed   By: Raymond  Rubner M.D.   On: 09/15/2015 20:22    ASSESSMENT AND PLAN:   The patient was seen today by Dr Vedansh Kerstetter, the patient evaluated and the data reviewed.  Active Problems:   Chest pain - his typical angina - ez neg so far - no resting sx - MD advise on cath.  - The risks and benefits of a cardiac catheterization including, but not limited to, death, stroke, MI, kidney damage and bleeding were discussed with the patient who indicates understanding and agrees to proceed.     Tobacco use - pt requests patch, ordered.   Signed: Barrett, Rhonda, PA-C 09/16/2015   9:31 AM Beeper [phone]  Co-Sign MD  Pateint seen and examined  Agree with findings as noted by R Gardner above  Pt with CP that is simlar to angina in past  Last week symptoms with walking on flat ground Given this I would reomm L heart cath to redefine anatomy. ON exam, Lungs are CTA  Cardiac RRR  No S3  No signif murmurs  Ext without edema Enzymes negative so far.   Keep on same medicines  Except hold metformin until 48 hours after procedure.    Eston Heslin    

## 2015-09-16 NOTE — Progress Notes (Signed)
Site area: right groin- arterial  Site Prior to Removal:  Level 0  Pressure Applied For 20 MINUTES    Minutes Beginning at 2205  Manual:   Yes.    Patient Status During Pull:  stable  Post Pull Groin Site:  Level 0  Post Pull Instructions Given:  Yes.    Post Pull Pulses Present:  Yes.    Dressing Applied:  Yes.    Comments:

## 2015-09-16 NOTE — H&P (Signed)
History and Physical  Chanc Kervin ZOX:[id number] DOB: 1970-08-25 DOA: 09/15/2015  PCP: No PCP Per Patient   Chief Complaint: Chest pain   History of Present Illness:  Patient is a 45 yo male with history of CAD (MI#3,last in Sep 2015) s/p 6 stents, HTN, DM who presented to the ED with cc of chest pain. The chest pain is substernal, exertional and has been going on for a week. He experienced the chest pain again today as he was walking from the theater towards his car. The pain lasts 30 min and is relieved by NG in the ER partially although he did not attempt to treat it during the last week. He is complaint with his medications. He has had associated dyspnea and nausea. No vomiting,abd pain, headache, fever or chills. He is now chest pain free.   Review of Systems:  CONSTITUTIONAL:  No night sweats.  No fatigue, malaise, lethargy.  No fever or chills. Eyes:  No visual changes.  No eye pain.  No eye discharge.   ENT:    No epistaxis.  No sinus pain.  No sore throat.  No ear pain.  No congestion. RESPIRATORY:  +cough.  No wheeze.  No hemoptysis.  +shortness of breath. CARDIOVASCULAR:  +chest pains.  No palpitations. GASTROINTESTINAL:  No abdominal pain.  +nausea .  No diarrhea or constipation.  No hematemesis.  No hematochezia.  No melena. GENITOURINARY:  No urgency.  No frequency.  No dysuria.  No hematuria.  No obstructive symptoms.  No discharge.  No pain.  No significant abnormal bleeding. MUSCULOSKELETAL:  No musculoskeletal pain.  No joint swelling.  No arthritis. NEUROLOGICAL:  No confusion.  No weakness. No headache. No seizure. PSYCHIATRIC:  No depression. No anxiety. No suicidal ideation. SKIN:  No rashes.  No lesions.  No wounds. ENDOCRINE:  No unexplained weight loss.  No polydipsia.  No polyuria.  No polyphagia. HEMATOLOGIC:  No anemia.  No purpura.  No petechiae.  No bleeding.  ALLERGIC AND IMMUNOLOGIC:  No pruritus.  No swelling Other:  Past Medical and Surgical  History:   Past Medical History  Diagnosis Date  . MI (myocardial infarction) (HCC)   . Coronary artery disease   . Diabetes mellitus without complication (HCC)   . Hypertension    Past Surgical History  Procedure Laterality Date  . Coronary stent placement      Social History:   has no tobacco, alcohol, and drug history on file. 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 Authorizing Provider  aspirin 81 MG tablet Take 81 mg by mouth daily.   Yes Historical Provider, MD  atorvastatin (LIPITOR) 10 MG tablet Take 10 mg by mouth daily.   Yes Historical Provider, MD  clopidogrel (PLAVIX) 75 MG tablet Take 75 mg by mouth daily.   Yes Historical Provider, MD  diclofenac (VOLTAREN) 75 MG EC tablet Take 75 mg by mouth 2 (two) times daily.   Yes Historical Provider, MD  glipiZIDE (GLUCOTROL) 5 MG tablet Take by mouth daily before breakfast.   Yes Historical Provider, MD  isosorbide mononitrate (IMDUR) 60 MG 24 hr tablet Take 60 mg by mouth daily.   Yes Historical Provider, MD  lisinopril (PRINIVIL,ZESTRIL) 5 MG tablet Take 5 mg by mouth daily.   Yes Historical Provider, MD  metFORMIN (GLUCOPHAGE) 500 MG tablet Take by mouth 2 (two) times daily with  a meal.   Yes Historical Provider, MD  metoprolol (LOPRESSOR) 50 MG tablet Take 50 mg by mouth 2 (two) times daily.   Yes Historical Provider, MD    Physical Exam: BP 185/86 mmHg  Pulse 84  Temp(Src) 98.7 F (37.1 C) (Oral)  Resp 18  Ht 5\' 5"  (1.651 m)  Wt 112.265 kg (247 lb 8 oz)  BMI 41.19 kg/m2  SpO2 97%  GENERAL : Well developed, well nourished, alert and cooperative, and appears to be in no acute distress. HEAD: normocephalic. EYES: PERRL, EOMI.  THROAT: Oral cavity and pharynx normal.   NECK: Neck supple CARDIAC: Normal S1 and S2. No S3, S4 or murmurs. Rhythm is regular. There is no peripheral edema, cyanosis or  pallor. LUNGS: Clear to auscultation  ABDOMEN: Soft, nondistended, nontender.  NEUROLOGICAL: The mental examination revealed the patient was oriented to person, place, and time.CN II-XII intact.  SKIN: Skin normal color PSYCHIATRIC:  The patient was able to demonstrate good judgement and reason, without hallucinations, abnormal affect or abnormal behaviors during the examination. Patient is not suicidal.          Labs on Admission:  Reviewed.   Radiological Exams on Admission: Dg Chest Portable 1 View  09/15/2015  CLINICAL DATA:  sternal chest pain history of myocardial infarction EXAM: PORTABLE CHEST 1 VIEW COMPARISON:  None. FINDINGS: Mild cardiac enlargement. Vascular pattern normal. No consolidation or effusion. Mild perihilar peribronchial wall thickening. Mild interstitial prominence. Small nodular opacity right lung base could represent discoid atelectasis. IMPRESSION: Mild cardiac silhouette enlargement. Mild diffuse interstitial prominence without pulmonary venous hypertension. No definite pulmonary edema. Possible mild interstitial lung disease. Mild nodular opacity right lung base possibly discoid atelectasis given young age of the patient. Consider short-term follow-up PA and lateral chest radiograph in 2-4 weeks to ensure resolution. Electronically Signed   By: Esperanza Heiraymond  Rubner M.D.   On: 09/15/2015 20:22    EKG:  Independently reviewed. ST elevation V1, ST depression in V4-6 and inferior leads.   Assessment/Plan  Chest pain; Cardiac, trop#1 neg, initial EKG with ST changes; will consult cardio, repeat EKG, trend trops. Pt is chest pain free now. NG SL as needed NPO after MN Continue asp, statin, imdur, BB  HTN: cont BB/ACEI/Imdur.   DM: glycemic control with low dose correction.    DVT prophylaxis: [place] enoxaparin  Consultants: Cardio Code Status: Full     Eston EstersAhmad Denali Sharma M.D [facility]

## 2015-09-16 NOTE — Plan of Care (Signed)
Problem: Phase I Progression Outcomes Goal: Anginal pain relieved Outcome: Completed/Met Date Met:  09/16/15 Pt remains chest pain free at this time  Problem: Phase II Progression Outcomes Goal: Anginal pain relieved Outcome: Completed/Met Date Met:  09/16/15 Pt remains free from chest pain at this time Goal: Cath/PCI Day Path if indicated Outcome: Completed/Met Date Met:  09/16/15 Pt going for cardiac cath today   Problem: Consults Goal: Cardiac Cath Patient Education (See Patient Education module for education specifics.) Outcome: Completed/Met Date Met:  09/16/15 Pt educated about cardiac cath   Problem: Phase I Progression Outcomes Goal: Pain controlled with appropriate interventions Outcome: Completed/Met Date Met:  09/16/15 Pt remains free from chest pain at this time

## 2015-09-16 NOTE — H&P (View-Only) (Signed)
CARDIOLOGY CONSULT NOTE   Patient ID: Marc Walker MRN: [id number] DOB/AGE: 07-11-1970 45 y.o.  Admit date: 09/15/2015  Primary Physician   No PCP Per Patient Primary Cardiologist   New Reason for Consultation   Chest pain  MVH:QIONG Marc is a 45 y.o. year old male with a history of MI and multiple stents, DM, HTN, HLD, tobacco use. Last stent in 2015.   For the last 2 weeks or so, he has been getting exertional chest pain. Symptoms relieved by rest. When walking on flat ground yesterday, he developed chest pain, worse than usual because he did not stop walking. 9/10 at the worst. Slight SOB, flushed, no diaphoresis, nausea, no vomiting.   Girlfriend was with him, she made him go to the ER. Chest pain has not returned. Has had GERD and OA pain. Symptoms like previous MI.    Recently got a job, before that, was out of medications for over a month because of no money. He takes his medication when he can afford it.   Past Medical History  Diagnosis Date  . MI (myocardial infarction) (HCC) 07/2009    [facility] in [place], [place] for last 2, 1st one in [place], [place]; total of 6 stents, last one 02/2014   . Coronary artery disease   . Diabetes mellitus without complication (HCC)   . Hypertension   . Hyperlipidemia associated with type 2 diabetes mellitus [facility])      Past Surgical History  Procedure Laterality Date  . Coronary stent placement      No Known Allergies  I have reviewed the patient's current medications . aspirin EC  81 mg Oral Daily  . atorvastatin  10 mg Oral Daily  . clopidogrel  75 mg Oral Daily  . enoxaparin (LOVENOX) injection  40 mg Subcutaneous Daily  . insulin aspart  0-9 Units Subcutaneous TID WC  . isosorbide mononitrate  60 mg Oral Daily  . lisinopril  5 mg Oral Daily  . metoprolol  50 mg Oral BID  . nicotine  21 mg Transdermal Daily     alum & mag hydroxide-simeth, nitroGLYCERIN, ondansetron (ZOFRAN) IV  Prior to Admission  medications   Medication Sig Start Date End Date Taking? Authorizing Provider  aspirin 81 MG tablet Take 81 mg by mouth daily.   Yes Historical Provider, MD  atorvastatin (LIPITOR) 10 MG tablet Take 10 mg by mouth daily.   Yes Historical Provider, MD  clopidogrel (PLAVIX) 75 MG tablet Take 75 mg by mouth daily.   Yes Historical Provider, MD  diclofenac (VOLTAREN) 75 MG EC tablet Take 75 mg by mouth 2 (two) times daily.   Yes Historical Provider, MD  glipiZIDE (GLUCOTROL) 5 MG tablet Take by mouth daily before breakfast.   Yes Historical Provider, MD  isosorbide mononitrate (IMDUR) 60 MG 24 hr tablet Take 60 mg by mouth daily.   Yes Historical Provider, MD  lisinopril (PRINIVIL,ZESTRIL) 5 MG tablet Take 5 mg by mouth daily.   Yes Historical Provider, MD  metFORMIN (GLUCOPHAGE) 500 MG tablet Take 500 mg by mouth 2 (two) times daily with a meal.    Yes Historical Provider, MD  metoprolol (LOPRESSOR) 50 MG tablet Take 50 mg by mouth 2 (two) times daily.   Yes Historical Provider, MD     Social History   Social History  . Marital Status: Single    Spouse Name: N/A  . Number of Children: N/A  . Years of Education: N/A  Occupational History  . Company secretary    Social History Main Topics  . Smoking status: Current Every Day Smoker -- 1.00 packs/day for 33 years    Types: Cigarettes  . Smokeless tobacco: Never Used  . Alcohol Use: No  . Drug Use: No  . Sexual Activity: Not on file   Other Topics Concern  . Not on file   Social History Narrative   Adopted. Very little info on biological parents, ?diabetes.    No family status information on file.   Family History  Problem Relation Age of Onset  . Diabetes Maternal Grandmother      ROS:  Full 14 point review of systems complete and found to be negative unless listed above.  Physical Exam: Blood pressure 148/92, pulse 59, temperature 98 F (36.7 C), temperature source Oral, resp. rate 15, height  (1.651 m), weight 245 lb  (111.131 kg), SpO2 94 %.  General: Well developed, well nourished, male in no acute distress Head: Eyes PERRLA, No xanthomas.   Normocephalic and atraumatic, oropharynx without edema or exudate. Dentition: fair Lungs: few rales, good air exchange Heart: HRRR S1 S2, no rub/gallop, no murmur. pulses are 2+ all 4 extrem.   Neck: No carotid bruits. No lymphadenopathy.  JVD not elevated. Abdomen: Bowel sounds present, abdomen soft and non-tender without masses or hernias noted. Msk:  No spine or cva tenderness. No weakness, no joint deformities or effusions. Extremities: No clubbing or cyanosis. No edema.  Neuro: Alert and oriented X 3. No focal deficits noted. Psych:  Good affect, responds appropriately Skin: No rashes or lesions noted.  Labs:   Lab Results  Component Value Date   WBC 11.4* 09/16/2015   HGB 13.7 09/16/2015   HCT 40.2 09/16/2015   MCV 92.2 09/16/2015   PLT 170 09/16/2015     Recent Labs Lab 09/16/15 0637  NA 140  K 3.9  CL 106  CO2 23  BUN 9  CREATININE 0.71  CALCIUM 8.6*  GLUCOSE 275*   No results found for: MG  Recent Labs  09/15/15 1925 09/16/15 0003 09/16/15 0613  TROPONINI <0.03 0.03 <0.03   Echo: ordered  ECG:  SR, no acute changes  Radiology:  Dg Chest Portable 1 View 09/15/2015  CLINICAL DATA:  sternal chest pain history of myocardial infarction EXAM: PORTABLE CHEST 1 VIEW COMPARISON:  None. FINDINGS: Mild cardiac enlargement. Vascular pattern normal. No consolidation or effusion. Mild perihilar peribronchial wall thickening. Mild interstitial prominence. Small nodular opacity right lung base could represent discoid atelectasis. IMPRESSION: Mild cardiac silhouette enlargement. Mild diffuse interstitial prominence without pulmonary venous hypertension. No definite pulmonary edema. Possible mild interstitial lung disease. Mild nodular opacity right lung base possibly discoid atelectasis given young age of the patient. Consider short-term follow-up PA  and lateral chest radiograph in 2-4 weeks to ensure resolution. Electronically Signed   By: Esperanza Heir M.D.   On: 09/15/2015 20:22    ASSESSMENT AND PLAN:   The patient was seen today by Dr Tenny Craw, the patient evaluated and the data reviewed.  Active Problems:   Chest pain - his typical angina - ez neg so far - no resting sx - MD advise on cath.  - The risks and benefits of a cardiac catheterization including, but not limited to, death, stroke, MI, kidney damage and bleeding were discussed with the patient who indicates understanding and agrees to proceed.     Tobacco use - pt requests patch, ordered.   SignedTheodore Demark, Cordelia Poche 09/16/2015  9:31 AM Beeper [phone]  Co-Sign MD  Pateint seen and examined  Agree with findings as noted by R Gardner above  Pt with CP that is simlar to angina in past  Last week symptoms with walking on flat ground Given this I would reomm L heart cath to redefine anatomy. ON exam, Lungs are CTA  Cardiac RRR  No S3  No signif murmurs  Ext without edema Enzymes negative so far.   Keep on same medicines  Except hold metformin until 48 hours after procedure.    Dietrich PatesPaula Ross

## 2015-09-17 DIAGNOSIS — R0789 Other chest pain: Secondary | ICD-10-CM

## 2015-09-17 DIAGNOSIS — E119 Type 2 diabetes mellitus without complications: Secondary | ICD-10-CM

## 2015-09-17 DIAGNOSIS — E1169 Type 2 diabetes mellitus with other specified complication: Secondary | ICD-10-CM

## 2015-09-17 LAB — BASIC METABOLIC PANEL
Anion gap: 12 (ref 5–15)
BUN: 11 mg/dL (ref 6–20)
CO2: 22 mmol/L (ref 22–32)
Calcium: 8.6 mg/dL — ABNORMAL LOW (ref 8.9–10.3)
Chloride: 103 mmol/L (ref 101–111)
Creatinine, Ser: 0.74 mg/dL (ref 0.61–1.24)
Glucose, Bld: 259 mg/dL — ABNORMAL HIGH (ref 65–99)
POTASSIUM: 3.6 mmol/L (ref 3.5–5.1)
SODIUM: 137 mmol/L (ref 135–145)

## 2015-09-17 LAB — GLUCOSE, CAPILLARY
GLUCOSE-CAPILLARY: 273 mg/dL — AB (ref 65–99)
Glucose-Capillary: 216 mg/dL — ABNORMAL HIGH (ref 65–99)

## 2015-09-17 LAB — POCT ACTIVATED CLOTTING TIME: ACTIVATED CLOTTING TIME: 337 s

## 2015-09-17 LAB — CBC
HEMATOCRIT: 38.8 % — AB (ref 39.0–52.0)
HEMOGLOBIN: 12.8 g/dL — AB (ref 13.0–17.0)
MCH: 30.1 pg (ref 26.0–34.0)
MCHC: 33 g/dL (ref 30.0–36.0)
MCV: 91.3 fL (ref 78.0–100.0)
Platelets: 177 10*3/uL (ref 150–400)
RBC: 4.25 MIL/uL (ref 4.22–5.81)
RDW: 13.6 % (ref 11.5–15.5)
WBC: 15.9 10*3/uL — AB (ref 4.0–10.5)

## 2015-09-17 LAB — HEMOGLOBIN A1C
Hgb A1c MFr Bld: 11.6 % — ABNORMAL HIGH (ref 4.8–5.6)
MEAN PLASMA GLUCOSE: 286 mg/dL

## 2015-09-17 MED ORDER — INSULIN STARTER KIT- SYRINGES (ENGLISH)
1.0000 | Freq: Once | Status: AC
Start: 2015-09-17 — End: 2015-09-17
  Administered 2015-09-17: 1
  Filled 2015-09-17: qty 1

## 2015-09-17 MED ORDER — INSULIN ASPART 100 UNIT/ML ~~LOC~~ SOLN: 5.0000 [IU] | Freq: Three times a day (TID) | SUBCUTANEOUS | Status: DC

## 2015-09-17 MED ORDER — INSULIN ASPART 100 UNIT/ML ~~LOC~~ SOLN: 0.0000 [IU] | Freq: Three times a day (TID) | SUBCUTANEOUS | Status: DC

## 2015-09-17 MED ORDER — INSULIN GLARGINE 100 UNIT/ML ~~LOC~~ SOLN: 20.0000 [IU] | Freq: Every day | SUBCUTANEOUS | Status: DC

## 2015-09-17 MED ORDER — LISINOPRIL 10 MG PO TABS: 10.0000 mg | ORAL_TABLET | Freq: Every day | ORAL | Status: DC

## 2015-09-17 MED ORDER — INSULIN ASPART 100 UNIT/ML ~~LOC~~ SOLN
5.0000 [IU] | Freq: Three times a day (TID) | SUBCUTANEOUS | Status: DC
  Administered 2015-09-17: 12:00:00 5 [IU] via SUBCUTANEOUS

## 2015-09-17 MED ORDER — LIVING WELL WITH DIABETES BOOK
Freq: Once | Status: AC
  Administered 2015-09-17: 13:00:00
  Filled 2015-09-17: qty 1

## 2015-09-17 MED ORDER — ATORVASTATIN CALCIUM 40 MG PO TABS: 40.0000 mg | ORAL_TABLET | Freq: Every day | ORAL | Status: DC

## 2015-09-17 MED ORDER — NITROGLYCERIN 0.4 MG SL SUBL
0.4000 mg | SUBLINGUAL_TABLET | SUBLINGUAL | Status: DC | PRN
Start: 2015-09-17 — End: 2018-11-15

## 2015-09-17 MED ORDER — INSULIN SYRINGES (DISPOSABLE) U-100 0.5 ML MISC: Status: DC

## 2015-09-17 MED ORDER — AMLODIPINE BESYLATE 5 MG PO TABS: 5.0000 mg | ORAL_TABLET | Freq: Every day | ORAL | Status: DC

## 2015-09-17 MED ORDER — ATORVASTATIN CALCIUM 40 MG PO TABS
40.0000 mg | ORAL_TABLET | Freq: Every day | ORAL | Status: DC
  Administered 2015-09-17: 40 mg via ORAL
  Filled 2015-09-17: qty 1

## 2015-09-17 MED ORDER — BLOOD GLUCOSE MONITOR KIT: PACK | Status: DC

## 2015-09-17 MED ORDER — LISINOPRIL 10 MG PO TABS
10.0000 mg | ORAL_TABLET | Freq: Every day | ORAL | Status: DC
  Administered 2015-09-17: 10 mg via ORAL
  Filled 2015-09-17: qty 1

## 2015-09-17 MED ORDER — INSULIN GLARGINE 100 UNIT/ML ~~LOC~~ SOLN
10.0000 [IU] | Freq: Once | SUBCUTANEOUS | Status: AC
  Administered 2015-09-17: 12:00:00 10 [IU] via SUBCUTANEOUS
  Filled 2015-09-17 (×2): qty 0.1

## 2015-09-17 MED ORDER — AMLODIPINE BESYLATE 5 MG PO TABS
5.0000 mg | ORAL_TABLET | Freq: Every day | ORAL | Status: DC
  Administered 2015-09-17: 5 mg via ORAL
  Filled 2015-09-17: qty 1

## 2015-09-17 MED FILL — Nitroglycerin IV Soln 100 MCG/ML in D5W: INTRA_ARTERIAL | Qty: 10 | Status: AC

## 2015-09-17 MED FILL — Heparin Sodium (Porcine) 2 Unit/ML in Sodium Chloride 0.9%: INTRAMUSCULAR | Qty: 500 | Status: AC

## 2015-09-17 MED FILL — ATORVASTATIN 40 MG TABLET: 40 | 30 days supply | Qty: 30 | Fill #0

## 2015-09-17 MED FILL — AMLODIPINE BESYLATE 5 MG TA: 5 | 30 days supply | Qty: 30 | Fill #0

## 2015-09-17 MED FILL — TRUE METRIX TEST STRIP: 30 days supply | Qty: 100 | Fill #0

## 2015-09-17 MED FILL — BD INSUL SYR 0.5 ML 31GX15/: 31G X 15/64 | 30 days supply | Qty: 100 | Fill #0

## 2015-09-17 MED FILL — TRUE METRIX BLOOD GLUCOSE M: W/DEVICE | 365 days supply | Qty: 1 | Fill #0

## 2015-09-17 MED FILL — NITROSTAT 0.4 MG TABLET SL: 0.4 | 25 days supply | Qty: 25 | Fill #0

## 2015-09-17 MED FILL — LISINOPRIL 10 MG TABLET: 10 | 30 days supply | Qty: 30 | Fill #0

## 2015-09-17 MED FILL — LANTUS 100 UNITS/ML VIAL: 100 | 30 days supply | Qty: 10 | Fill #0

## 2015-09-17 MED FILL — TRUEplus LANCETS 28G MISC: 25 days supply | Qty: 100 | Fill #0

## 2015-09-17 MED FILL — Verapamil HCl IV Soln 2.5 MG/ML: INTRAVENOUS | Qty: 2 | Status: AC

## 2015-09-17 NOTE — Care Management Note (Addendum)
Case Management Note  Patient Details  Name: Marc Walker MRN: [id number] Date of Birth: May 07, 1971  Subjective/Objective:    Chest pain, s/p stent placement                Action/Plan: Discharge Planning:   NCM spoke to pt at bedside. States he lives in home with parents, mother [name]. 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 States he uses the [employer] app to get his medications at a discounted rate. [facility] and they gave him Rx with refills. Pt states he cannot apply for disability and work. [facility]. Pt requested assistance with his medications.  [facility] - September 24, 2015 at 3:00 pm.  Provided pt with brochure for [facility] for [facility] waiver once they become available.  MATCH letter provided- explained to pt that he can use program once per year, he will have a $3 copay. And [facility]. They will provide him with glucometer for free.   Expected Discharge Date:  09/17/2015               Expected Discharge Plan:  Home/Self Care  In-House Referral:  NA  Discharge planning Services  CM Consult, Indigent Health Clinic, Medication Assistance, MATCH  Post Acute Care Choice:  NA Choice offered to:  NA  DME Arranged:  N/A DME Agency:  NA  HH Arranged:  NA HH Agency:  NA  Status of Service:  Completed, signed off  Medicare Important Message Given:    Date Medicare IM Given:    Medicare IM give by:    Date Additional Medicare IM Given:    Additional Medicare Important Message give by:     If discussed at Long Length of Stay Meetings, dates discussed:    Additional Comments:  Elliot CousinShavis, Rosaleen Mazer Ellen, RN 09/17/2015, 10:25 AM

## 2015-09-17 NOTE — Progress Notes (Signed)
Patient Name: Marc Walker Date of Encounter: 09/17/2015   SUBJECTIVE  Feeling well. No chest pain, sob or palpitations.   CURRENT MEDS . aspirin  81 mg Oral Daily  . atorvastatin  10 mg Oral Daily  . clopidogrel  75 mg Oral Q breakfast  . enoxaparin (LOVENOX) injection  40 mg Subcutaneous Daily  . insulin aspart  0-9 Units Subcutaneous TID WC  . insulin glargine  10 Units Subcutaneous Daily  . isosorbide mononitrate  60 mg Oral Daily  . lisinopril  5 mg Oral Daily  . metoprolol  50 mg Oral BID  . nicotine  21 mg Transdermal Daily  . sodium chloride flush  3 mL Intravenous Q12H    OBJECTIVE  Filed Vitals:   09/17/15 0300 09/17/15 0400 09/17/15 0417 09/17/15 0505  BP: 144/70 132/87 132/87 156/84  Pulse: 80 72 77   Temp:   98.2 F (36.8 C)   TempSrc:   Oral   Resp: 19 19 16 14   Height:      Weight:   246 lb 14.6 oz (112 kg)   SpO2: 95% 94% 95%     Intake/Output Summary (Last 24 hours) at 09/17/15 0719 Last data filed at 09/17/15 0644  Gross per 24 hour  Intake 1854.26 ml  Output   2300 ml  Net -445.74 ml   Filed Weights   09/15/15 2321 09/16/15 0500 09/17/15 0417  Weight: 247 lb 8 oz (112.265 kg) 245 lb (111.131 kg) 246 lb 14.6 oz (112 kg)    PHYSICAL EXAM  General: Pleasant, NAD. Neuro: Alert and oriented X 3. Moves all extremities spontaneously. Psych: Normal affect. HEENT:  Normal  Neck: Supple without bruits or JVD. Lungs:  Resp regular and unlabored, CTA. Heart: RRR no s3, s4, or murmurs. Abdomen: Soft, non-tender, non-distended, BS + x 4.  Extremities: No clubbing, cyanosis or edema. DP/PT/Radials 2+ and equal bilaterally. R groin cath site without hematoma or bruise.   Accessory Clinical Findings  CBC  Recent Labs  09/16/15 0637 09/17/15 0321  WBC 11.4* 15.9*  HGB 13.7 12.8*  HCT 40.2 38.8*  MCV 92.2 91.3  PLT 170 177   Basic Metabolic Panel  Recent Labs  09/16/15 0637 09/17/15 0321  NA 140 137  K 3.9 3.6  CL 106 103  CO2 23  22  GLUCOSE 275* 259*  BUN 9 11  CREATININE 0.71 0.74  CALCIUM 8.6* 8.6*   Liver Function Tests No results for input(s): AST, ALT, ALKPHOS, BILITOT, PROT, ALBUMIN in the last 72 hours. No results for input(s): LIPASE, AMYLASE in the last 72 hours. Cardiac Enzymes  Recent Labs  09/15/15 1925 09/16/15 0003 09/16/15 0613  TROPONINI <0.03 0.03 <0.03   BNP Invalid input(s): POCBNP D-Dimer No results for input(s): DDIMER in the last 72 hours. Hemoglobin A1C No results for input(s): HGBA1C in the last 72 hours. Fasting Lipid Panel  Recent Labs  09/16/15 0930  CHOL 163  HDL 42  LDLCALC 87  TRIG 168*  CHOLHDL 3.9    TELE  Sinus rhythm  Radiology/Studies  Dg Chest Portable 1 View  09/15/2015  CLINICAL DATA:  sternal chest pain history of myocardial infarction EXAM: PORTABLE CHEST 1 VIEW COMPARISON:  None. FINDINGS: Mild cardiac enlargement. Vascular pattern normal. No consolidation or effusion. Mild perihilar peribronchial wall thickening. Mild interstitial prominence. Small nodular opacity right lung base could represent discoid atelectasis. IMPRESSION: Mild cardiac silhouette enlargement. Mild diffuse interstitial prominence without pulmonary venous hypertension. No definite pulmonary edema. Possible mild  interstitial lung disease. Mild nodular opacity right lung base possibly discoid atelectasis given young age of the patient. Consider short-term follow-up PA and lateral chest radiograph in 2-4 weeks to ensure resolution. Electronically Signed   By: Esperanza Heir M.D.   On: 09/15/2015 20:22   Coronary Stent Intervention    Left Heart Cath and Coronary Angiography    Conclusion     Mid Cx lesion, 70% stenosed.  Prox LAD to Mid LAD lesion, 90% stenosed.  2nd Diag lesion, 90% stenosed.  Mid RCA to Dist RCA lesion, 20% stenosed. The lesion was previously treated with a stent (unknown type).  Ost RPDA to RPDA lesion, 20% stenosed. Post intervention, there is a 20%  residual stenosis. The lesion was previously treated with a stent (unknown type).  Mid RCA lesion, 80% stenosed. Post intervention, there is a 0% residual stenosis.  Mid LAD to Dist LAD lesion, 70% stenosed. Post intervention, there is a 0% residual stenosis. The lesion was previously treated with a stent (unknown type).  Marc Walker is a 45 y.o. male  IMPRESSION:successful proximal LAD PCI and drug-eluting stenting as well as mid RCA along with Angiosculpt balloon angioplasty of the proximal segmental PDA "in-stent restenosis. The patient tolerated the procedure well. He left the lab in stable condition. The sheaths were removed after the Angiomax has been discontinued and pressure will be held. He'll be hydrated overnight and discharged home in the morning on antiplatelet therapy. Cardiac risk factor modification including smoking cessation should be encouraged.  ASSESSMENT AND PLAN  1. Chest pain - s/p successful proximal LAD PCI and drug-eluting stenting as well as mid RCA along with Angiosculpt balloon angioplasty of the proximal segmental PDA "in-stent restenosis. Patent Ost LAD to Prox LAD stent (unknown type). Patent Ost 2nd Mrg to 2nd Mrg stent (unknown type). 20% in-stenosed Mid RCA to Dist RCA lesion (unknown type). 20% in-stenosed Ost RPDA to RPDA lesion (unknown type). Mid Cx lesion, 70% stenosed. - Continue ASA/plavix/statin/lisinopril/metoprolol and imdur.   2. HL - 09/16/2015: Cholesterol 163; HDL 42; LDL Cholesterol 87; Triglycerides 168*; VLDL 34  - On lipitor . LDL goal less than 70. Will increase  and recheck LFT and lipid panel in 6 weeks.   3. HTN - uncontrolled. Will wean off IV nitro. BP is still elevated, will increase lisinopril to  dose. Increase further as needed. Can try adding amlodipine. Scr stable.   4. DM - A1c pending  5. Smoking cessation - advise cessation  Dispo: he needs significant lifestyle changes. Education given for more than 15  mis. Pt is willing to quit smoking. Need nicotine patch at discharge. The patient will f/u with Dr. Tenny Craw now on. Establish care with PCP. APP appointment in 2 weeks. Ambulate, if no anginal pain. He can be discharge later today.     Signed, Bhagat,Bhavinkumar PA-C Pager [phone]  Pt seen and examined  I  Agree with findings as noted by B Bhagat above   Pt feeling good  No CP or SOB  Lungs CTA  Card RRR No S3  Ext no edema Plan for d/c today  COunselled on smoking    Will have f/u set in our clinic  [employer]

## 2015-09-17 NOTE — Progress Notes (Addendum)
CARDIAC REHAB PHASE I   PRE:  Rate/Rhythm: 86 SR  BP:  Sitting: 164/80        SaO2:   MODE:  Ambulation: 500 ft   POST:  Rate/Rhythm: 85 SR  BP:  Sitting: 169/97         SaO2: 99 RA  Pt ambulated 500 ft on RA, independent, no complaints. Completed PCI/stent education.  Reviewed risk factors, tobacco cessation (gave pt fake cigarette), anti-platelet therapy, stent card, activity restrictions, ntg, exercise, heart healthy diet, carb counting, portion control, sodium restrictions and phase 2 cardiac rehab. Pt verbalized understanding, receptive to education, needs reinforcement of diet. Pt has order to see dietician prior to discharge, which would be beneficial. Pt agrees to phase 2 cardiac rehab referral, although pt does no have insurance and works full time, states it is unlikely he will be able to participate. Will send referral to [facility] per pt request. Pt to see case manager prior to discharge re medication needs/establish PCP. Pt to edge of bed per pt request after walk, call bell within reach.   [phone]  Joylene GrapesEmily C Candon Caras, RN, BSN 09/17/2015 9:16 AM

## 2015-09-17 NOTE — Progress Notes (Signed)
VENOUS SHEATH REMOVED FROM RIGHT FEMORAL VEIN. PRESSURE HELD 10 MINUTES. MINUTES STARTED AT 2215; ENDED AT 2225. HEMOSTASIS MAINTAINED. PT TOLERATED WELL. LEVEL 0. DRESSED WITH A 4X4 AND PRESSURE TAPE.

## 2015-09-17 NOTE — Plan of Care (Signed)
Problem: Food- and Nutrition-Related Knowledge Deficit (NB-1.1) Goal: Nutrition education Formal process to instruct or train a patient/client in a skill or to impart knowledge to help patients/clients voluntarily manage or modify food choices and eating behavior to maintain or improve health. Outcome: Adequate for Discharge  RD consulted for nutrition education regarding diabetes.     Lab Results  Component Value Date    HGBA1C 11.6* 09/16/2015   Spoke with RN who reports pt is being discharged today and transitioning to insulin. Pt needs to see RD and DM coordinator prior to d/c per MD request.   Spent over 60 minutes counseling pt at bedside. Pt with fair understanding of carbohydrate sources and DM diet principles. Pt often consumes fast food and eats at buffets and confides in this RD that he struggles with portion control and overeating. Spent the majority of visit using motivational interviewing techniques to assist pt with adopting healthier food choices, including discussing different restaurant/menu options and food preparation methods. Pt reports lifestyle change will be difficult for him to adopt, but provided encouragement. Used "change talk" to help empower pt to transition to better lifestyle changes.   RD provided "Carbohydrate Counting for People with Diabetes" handout from the Academy of Nutrition and Dietetics. Discussed different food groups and their effects on blood sugar, emphasizing carbohydrate-containing foods. Provided list of carbohydrates and recommended serving sizes of common foods.  Discussed importance of controlled and consistent carbohydrate intake throughout the day. Provided examples of ways to balance meals/snacks and encouraged intake of high-fiber, whole grain complex carbohydrates. Teach back method used.  Expect fair compliance.  Body mass index is 41.09 kg/(m^2). Pt meets criteria for extreme obesity, class III based on current BMI.  Current diet  order is Heart Healthy/Carb Modified, patient is consuming approximately 100% of meals at this time. Labs and medications reviewed. No further nutrition interventions warranted at this time. RD contact information provided. If additional nutrition issues arise, please re-consult RD.  Reeanna Acri A. Mayford KnifeWilliams, RD, LDN, CDE Pager: [phone] After hours Pager: [phone]

## 2015-09-17 NOTE — Progress Notes (Signed)
Inpatient Diabetes Program Recommendations  AACE/ADA: New Consensus Statement on Inpatient Glycemic Control (2015)  Target Ranges:  Prepandial:   less than 140 mg/dL      Peak postprandial:   less than 180 mg/dL (1-2 hours)      Critically ill patients:  140 - 180 mg/dL  Results for JAYRO, MCMATH (MRN [id number]) as of 09/17/2015 14:45  Ref. Range 09/16/2015 11:45 09/16/2015 18:10 09/16/2015 22:54 09/17/2015 06:31 09/17/2015 11:09  Glucose-Capillary Latest Ref Range: 65-99 mg/dL 211 (H) 131 (H) 107 (H) 216 (H) 273 (H)   Review of Glycemic Control   Inpatient Diabetes Program Recommendations:  Ordered insulin starter kit. Pt. Has order for glucose meter. Pt. Has not been checking blood sugars at home due to not having a meter to use. Spoke with pt. Regarding elevated A1c. Pt. Acknowledges understanding of impact of elevated blood sugars in regards to cardiovascular disease. Pt. Is watching educational videos on insulin administration and basic diabetes care and hypoglycemia. RN: Please begin insulin teaching with this patient in case decision made by MD to send pt home on insulin. A1c elevated. Please allow pt to practice drawing up and giving injections as much as possible and document.  RNs/NTs: Please begin CBG meter teaching with patient asap.  Please allow pt to practice checking own CBGs with hospital lancets and review basic principles of CBG meter use.  Thanks!   Nani Gasser Trae Bovenzi, RN, MSN, CDE Inpatient Glycemic Control Team Team Pager [phone] (8am-5pm) 09/17/2015 2:43 PM

## 2015-09-17 NOTE — Discharge Summary (Signed)
Physician Discharge Summary   Patient ID: Marc Walker MRN: [id number] DOB/AGE: 03-13-1971 45 y.o.  Admit date: 09/15/2015 Discharge date: 09/17/2015  Primary Care Physician:  No PCP Per Patient  Discharge Diagnoses:    . Chest pain status post successful proximal LAD PCI and DES, mid RCA, proximal PDA balloon angioplasty  . Essential hypertension uncontrolled  . MI (myocardial infarction) ([place]) . Hyperlipidemia  Type 2 diabetes mellitus ([place]) uncontrolled Morbid Obesity  BMI 41   Consults: Cardiology  Outpatient Follow-up:  1. Please repeat CBC/BMET at next visit 2. please follow hemoglobin A1c, A1c on 09/16/15 is 11.6    DIET: Carb modified     Allergies:  No Known Allergies   DISCHARGE MEDICATIONS: Current Discharge Medication List    START taking these medications   Details  amLODipine (NORVASC) 5 MG tablet Take 1 tablet (5 mg total) by mouth daily. Qty: 30 tablet, Refills: 4    blood glucose meter kit and supplies KIT Dispense based on patient and insurance preference. Use up to four times daily as directed. (FOR ICD-9 250.00, 250.01). Qty: 1 each, Refills: 0    insulin glargine (LANTUS) 100 UNIT/ML injection Inject 0.2 mLs (20 Units total) into the skin daily. Qty: 10 mL, Refills: 11    Insulin Syringes, Disposable, U-100 0.5 ML MISC Take insulin as directed   Diagnosis: Insulin dependent diabetes mellitus ICD10 250 Qty: 1 each, Refills: 0    nitroGLYCERIN (NITROSTAT) 0.4 MG SL tablet Place 1 tablet (0.4 mg total) under the tongue every 5 (five) minutes as needed for chest pain. Qty: 30 tablet, Refills: 12      CONTINUE these medications which have CHANGED   Details  atorvastatin (LIPITOR) 40 MG tablet Take 1 tablet (40 mg total) by mouth at bedtime. Qty: 30 tablet, Refills: 4    lisinopril (PRINIVIL,ZESTRIL) 10 MG tablet Take 1 tablet (10 mg total) by mouth daily. Qty: 30 tablet, Refills: 3      CONTINUE these medications which have NOT CHANGED    Details  aspirin 81 MG tablet Take 81 mg by mouth daily.    clopidogrel (PLAVIX) 75 MG tablet Take 75 mg by mouth daily.    diclofenac (VOLTAREN) 75 MG EC tablet Take 75 mg by mouth 2 (two) times daily.    isosorbide mononitrate (IMDUR) 60 MG 24 hr tablet Take 60 mg by mouth daily.    metFORMIN (GLUCOPHAGE) 500 MG tablet Take 500 mg by mouth 2 (two) times daily with a meal.     metoprolol (LOPRESSOR) 50 MG tablet Take 50 mg by mouth 2 (two) times daily.      STOP taking these medications     glipiZIDE (GLUCOTROL) 5 MG tablet          Brief H and P: For complete details please refer to admission H and P, but in brief  Patient is a 45 year old male with CAD, MI last in September 2015, per patient. PCI, 6 stents, hypertension hyperlipidemia, diabetes, tobacco abuse presented with chest pain. Patient aborted as chest pain substernal, exertional, intermittent for a week. No recent cardiac workup.  Hospital Course:  Chest pain, concerning for angina Cardiac enzymes remained negative. However patient has a significant history of coronary artery disease with multiple stents in the past. Cardiology was consulted and patient underwent cardiac catheterization s/p successful proximal LAD PCI and drug-eluting stenting as well as mid RCA along with Angiosculpt balloon angioplasty of the proximal segmental PDA "in-stent restenosis. Patent Ost LAD to Prox LAD  stent (unknown type). Patent Ost 2nd Mrg to 2nd Mrg stent (unknown type). 20% in-stenosed Mid RCA to Dist RCA lesion (unknown type). 20% in-stenosed Ost RPDA to RPDA lesion (unknown type). Mid Cx lesion, 70% stenosed. - Continue ASA/plavix/statin/lisinopril/metoprolol and imdur.  - Lisinopril was increased to 10 mg daily, Lipitor increased to 40 mg daily  Hypertension uncontrolled - Continue Imdur, lisinopril, metoprolol - Lisinopril was increased to 10 mg daily, amlodipine was added 5 mg daily  Hyperlipidemia - Lipid panel showed LDL  87, triglycerides 160, continue statin, Lipitor increased to 40 mg daily  Diabetes mellitus type 2 - Uncontrolled hemoglobin A1c 11.6. Patient was counseled strongly on diet, exercise and medication compliance. Diabetic coordinator consult, nutrition consults were obtained. Ambulatory referral to endocrinology and nutrition/diabetic classes were sent. Patient was placed on Lantus 20 mg and continued on metformin 500 mg twice a day.  Tobacco abuse Patient counseled strongly on tobacco cessation, placed on nicotine patch     Day of Discharge BP 174/99 mmHg  Pulse 77  Temp(Src) 98.1 F (36.7 C) (Oral)  Resp 18  Ht _0  (1.651 m)  Wt 112 kg (246 lb 14.6 oz)  BMI 41.09 kg/m2  SpO2 97%  Physical Exam: General: Alert and awake oriented x3 not in any acute distress. HEENT: anicteric sclera, pupils reactive to light and accommodation CVS: S1-S2 clear no murmur rubs or gallops Chest: clear to auscultation bilaterally, no wheezing rales or rhonchi Abdomen: soft nontender, nondistended, normal bowel sounds Extremities: no cyanosis, clubbing or edema noted bilaterally Neuro: Cranial nerves II-XII intact, no focal neurological deficits   The results of significant diagnostics from this hospitalization (including imaging, microbiology, ancillary and laboratory) are listed below for reference.    LAB RESULTS: Basic Metabolic Panel:  Recent Labs Lab 09/16/15 0637 09/17/15 0321  NA 140 137  K 3.9 3.6  CL 106 103  CO2 23 22  GLUCOSE 275* 259*  BUN 9 11  CREATININE 0.71 0.74  CALCIUM 8.6* 8.6*   Liver Function Tests: No results for input(s): AST, ALT, ALKPHOS, BILITOT, PROT, ALBUMIN in the last 168 hours. No results for input(s): LIPASE, AMYLASE in the last 168 hours. No results for input(s): AMMONIA in the last 168 hours. CBC:  Recent Labs Lab 09/16/15 0637 09/17/15 0321  WBC 11.4* 15.9*  HGB 13.7 12.8*  HCT 40.2 38.8*  MCV 92.2 91.3  PLT 170 177   Cardiac  Enzymes:  Recent Labs Lab 09/16/15 0003 09/16/15 0613  TROPONINI 0.03 <0.03   BNP: Invalid input(s): POCBNP CBG:  Recent Labs Lab 09/17/15 0631 09/17/15 1109  GLUCAP 216* 273*    Significant Diagnostic Studies:  Dg Chest Portable 1 View  09/15/2015  CLINICAL DATA:  sternal chest pain history of myocardial infarction EXAM: PORTABLE CHEST 1 VIEW COMPARISON:  None. FINDINGS: Mild cardiac enlargement. Vascular pattern normal. No consolidation or effusion. Mild perihilar peribronchial wall thickening. Mild interstitial prominence. Small nodular opacity right lung base could represent discoid atelectasis. IMPRESSION: Mild cardiac silhouette enlargement. Mild diffuse interstitial prominence without pulmonary venous hypertension. No definite pulmonary edema. Possible mild interstitial lung disease. Mild nodular opacity right lung base possibly discoid atelectasis given young age of the patient. Consider short-term follow-up PA and lateral chest radiograph in 2-4 weeks to ensure resolution. Electronically Signed   By: Skipper Cliche M.D.   On: 09/15/2015 20:22     Procedures    Coronary Stent Intervention   Left Heart Cath and Coronary Angiography    Conclusion  Mid Cx lesion, 70% stenosed.  Prox LAD to Mid LAD lesion, 90% stenosed.  2nd Diag lesion, 90% stenosed.  Mid RCA to Dist RCA lesion, 20% stenosed. The lesion was previously treated with a stent (unknown type).  Ost RPDA to RPDA lesion, 20% stenosed. Post intervention, there is a 20% residual stenosis. The lesion was previously treated with a stent (unknown type).  Mid RCA lesion, 80% stenosed. Post intervention, there is a 0% residual stenosis.  Mid LAD to Dist LAD lesion, 70% stenosed. Post intervention, there is a 0% residual stenosis. The lesion was previously treated with a stent (unknown type).   IMPRESSION:successful proximal LAD PCI and drug-eluting stenting as well as mid RCA along with Angiosculpt balloon  angioplasty of the proximal segmental PDA "in-stent restenosis. The patient tolerated the procedure well. He left the lab in stable condition. The sheaths were removed after the Angiomax has been discontinued and pressure will be held. He'll be hydrated overnight and discharged home in the morning on antiplatelet therapy. Cardiac risk factor modification including smoking cessation should be encouraged.    Disposition and Follow-up: Discharge Instructions    AMB Referral to Cardiac Rehabilitation - Phase II    Complete by:  As directed   Diagnosis:  PCI     Amb Referral to Cardiac Rehabilitation    Complete by:  As directed   To [place]  Diagnosis:  PCI     Ambulatory referral to Endocrinology    Complete by:  As directed   Uncontrolled diabetes, placed on insulin, Hb A1C11     Ambulatory referral to Nutrition and Diabetic Education    Complete by:  As directed      Diet Carb Modified    Complete by:  As directed      Discharge instructions    Complete by:  As directed   It is VERY IMPORTANT that you follow up with a PCP on a regular basis.  Check your blood glucoses before each meal and at bedtime and maintain a log of your readings.  Bring this log with you when you follow up with your PCP so that he or she can adjust your insulin at your follow up visit.     Increase activity slowly    Complete by:  As directed             DISPOSITION:  home   DISCHARGE FOLLOW-UP Follow-up Information    Follow up with [facility].   Why:  appointment September 24, 2015 at 3:00 pm, please bring a list of medications, and ID   Contact information:   [address] [place] [address] [phone]      Follow up with Dorris Carnes, MD. Schedule an appointment as soon as possible for a visit in 2 weeks.   Specialty:  Cardiology   Why:  for hospital follow-up   Contact information:   [address] [place]  [id number] [phone]        Time spent on Discharge: 45 minutes  Signed:   Estle Huguley M.D. [facility] 09/17/2015, 1:24 PM Pager: [phone]

## 2015-09-24 ENCOUNTER — Ambulatory Visit: Payer: Self-pay | Attending: Internal Medicine | Admitting: Internal Medicine

## 2015-09-24 ENCOUNTER — Encounter: Payer: Self-pay | Admitting: Internal Medicine

## 2015-09-24 ENCOUNTER — Telehealth: Payer: Self-pay | Admitting: Internal Medicine

## 2015-09-24 VITALS — BP 126/77 | HR 81 | Temp 98.0°F | Resp 16 | Ht 65.0 in | Wt 250.0 lb

## 2015-09-24 DIAGNOSIS — E119 Type 2 diabetes mellitus without complications: Secondary | ICD-10-CM

## 2015-09-24 DIAGNOSIS — Z794 Long term (current) use of insulin: Secondary | ICD-10-CM

## 2015-09-24 DIAGNOSIS — F17200 Nicotine dependence, unspecified, uncomplicated: Secondary | ICD-10-CM

## 2015-09-24 DIAGNOSIS — I10 Essential (primary) hypertension: Secondary | ICD-10-CM

## 2015-09-24 DIAGNOSIS — E1140 Type 2 diabetes mellitus with diabetic neuropathy, unspecified: Secondary | ICD-10-CM

## 2015-09-24 DIAGNOSIS — I25110 Atherosclerotic heart disease of native coronary artery with unstable angina pectoris: Secondary | ICD-10-CM

## 2015-09-24 DIAGNOSIS — Z8679 Personal history of other diseases of the circulatory system: Secondary | ICD-10-CM

## 2015-09-24 LAB — GLUCOSE, POCT (MANUAL RESULT ENTRY): POC GLUCOSE: 275 mg/dL — AB (ref 70–99)

## 2015-09-24 MED ORDER — GABAPENTIN 300 MG PO CAPS: 300.0000 mg | ORAL_CAPSULE | Freq: Two times a day (BID) | ORAL | Status: DC

## 2015-09-24 MED ORDER — INSULIN GLARGINE 100 UNIT/ML ~~LOC~~ SOLN: 15.0000 [IU] | Freq: Two times a day (BID) | SUBCUTANEOUS | Status: DC

## 2015-09-24 MED ORDER — NICOTINE 14 MG/24HR TD PT24: 14.0000 mg | MEDICATED_PATCH | Freq: Every day | TRANSDERMAL | Status: DC

## 2015-09-24 MED FILL — GABAPENTIN 300 MG CAPSULE: 300 | 30 days supply | Qty: 60 | Fill #0

## 2015-09-24 NOTE — Patient Instructions (Addendum)
- 2 wks fu w/ [facility] for bp /dm - financial services - eye exam, I will make referral as well  Diabetes Mellitus and Food It is important for you to manage your blood sugar (glucose) level. Your blood glucose level can be greatly affected by what you eat. Eating healthier foods in the appropriate amounts throughout the day at about the same time each day will help you control your blood glucose level. It can also help slow or prevent worsening of your diabetes mellitus. Healthy eating may even help you improve the level of your blood pressure and reach or maintain a healthy weight.  General recommendations for healthful eating and cooking habits include:  Eating meals and snacks regularly. Avoid going long periods of time without eating to lose weight.  Eating a diet that consists mainly of plant-based foods, such as fruits, vegetables, nuts, legumes, and whole grains.  Using low-heat cooking methods, such as baking, instead of high-heat cooking methods, such as deep frying. Work with your dietitian to make sure you understand how to use the Nutrition Facts information on food labels. HOW CAN FOOD AFFECT ME? Carbohydrates Carbohydrates affect your blood glucose level more than any other type of food. Your dietitian will help you determine how many carbohydrates to eat at each meal and teach you how to count carbohydrates. Counting carbohydrates is important to keep your blood glucose at a healthy level, especially if you are using insulin or taking certain medicines for diabetes mellitus. Alcohol Alcohol can cause sudden decreases in blood glucose (hypoglycemia), especially if you use insulin or take certain medicines for diabetes mellitus. Hypoglycemia can be a life-threatening condition. Symptoms of hypoglycemia (sleepiness, dizziness, and disorientation) are similar to symptoms of having too much alcohol.  If your health care provider has given you approval to drink alcohol, do so in  moderation and use the following guidelines:  Women should not have more than one drink per day, and men should not have more than two drinks per day. One drink is equal to:  12 oz of beer.  5 oz of wine.  1 oz of hard liquor.  Do not drink on an empty stomach.  Keep yourself hydrated. Have water, diet soda, or unsweetened iced tea.  Regular soda, juice, and other mixers might contain a lot of carbohydrates and should be counted. WHAT FOODS ARE NOT RECOMMENDED? As you make food choices, it is important to remember that all foods are not the same. Some foods have fewer nutrients per serving than other foods, even though they might have the same number of calories or carbohydrates. It is difficult to get your body what it needs when you eat foods with fewer nutrients. Examples of foods that you should avoid that are high in calories and carbohydrates but low in nutrients include:  Trans fats (most processed foods list trans fats on the Nutrition Facts label).  Regular soda.  Juice.  Candy.  Sweets, such as cake, pie, doughnuts, and cookies.  Fried foods. WHAT FOODS CAN I EAT? Eat nutrient-rich foods, which will nourish your body and keep you healthy. The food you should eat also will depend on several factors, including:  The calories you need.  The medicines you take.  Your weight.  Your blood glucose level.  Your blood pressure level.  Your cholesterol level. You should eat a variety of foods, including:  Protein.  Lean cuts of meat.  Proteins low in saturated fats, such as fish, egg whites, and beans.  Avoid processed meats.  Fruits and vegetables.  Fruits and vegetables that may help control blood glucose levels, such as apples, mangoes, and yams.  Dairy products.  Choose fat-free or low-fat dairy products, such as milk, yogurt, and cheese.  Grains, bread, pasta, and rice.  Choose whole grain products, such as multigrain bread, whole oats, and brown  rice. These foods may help control blood pressure.  Fats.  Foods containing healthful fats, such as nuts, avocado, olive oil, canola oil, and fish. DOES EVERYONE WITH DIABETES MELLITUS HAVE THE SAME MEAL PLAN? Because every person with diabetes mellitus is different, there is not one meal plan that works for everyone. It is very important that you meet with a dietitian who will help you create a meal plan that is just right for you.   This information is not intended to replace advice given to you by your health care provider. Make sure you discuss any questions you have with your health care provider.   Document Released: 03/12/2005 Document Revised: 07/06/2014 Document Reviewed: 05/12/2013 Elsevier Interactive Patient Education 2016 Elsevier Inc.   - DASH Eating Plan DASH stands for "Dietary Approaches to Stop Hypertension." The DASH eating plan is a healthy eating plan that has been shown to reduce high blood pressure (hypertension). Additional health benefits may include reducing the risk of type 2 diabetes mellitus, heart disease, and stroke. The DASH eating plan may also help with weight loss. WHAT DO I NEED TO KNOW ABOUT THE DASH EATING PLAN? For the DASH eating plan, you will follow these general guidelines:  Choose foods with a percent daily value for sodium of less than 5% (as listed on the food label).  Use salt-free seasonings or herbs instead of table salt or sea salt.  Check with your health care provider or pharmacist before using salt substitutes.  Eat lower-sodium products, often labeled as "lower sodium" or "no salt added."  Eat fresh foods.  Eat more vegetables, fruits, and low-fat dairy products.  Choose whole grains. Look for the word "whole" as the first word in the ingredient list.  Choose fish and skinless chicken or [place] more often than red meat. Limit fish, poultry, and meat to 6 oz (170 g) each day.  Limit sweets, desserts, sugars, and sugary  drinks.  Choose heart-healthy fats.  Limit cheese to 1 oz (28 g) per day.  Eat more home-cooked food and less restaurant, buffet, and fast food.  Limit fried foods.  Cook foods using methods other than frying.  Limit canned vegetables. If you do use them, rinse them well to decrease the sodium.  When eating at a restaurant, ask that your food be prepared with less salt, or no salt if possible. WHAT FOODS CAN I EAT? Seek help from a dietitian for individual calorie needs. Grains Whole grain or whole wheat bread. Brown rice. Whole grain or whole wheat pasta. Quinoa, bulgur, and whole grain cereals. Low-sodium cereals. Corn or whole wheat flour tortillas. Whole grain cornbread. Whole grain crackers. Low-sodium crackers. Vegetables Fresh or frozen vegetables (raw, steamed, roasted, or grilled). Low-sodium or reduced-sodium tomato and vegetable juices. Low-sodium or reduced-sodium tomato sauce and paste. Low-sodium or reduced-sodium canned vegetables.  Fruits All fresh, canned (in natural juice), or frozen fruits. Meat and Other Protein Products Ground beef (85% or leaner), grass-fed beef, or beef trimmed of fat. Skinless chicken or [place]. Ground chicken or [place]. Pork trimmed of fat. All fish and seafood. Eggs. Dried beans, peas, or lentils. Unsalted nuts and seeds. Unsalted  canned beans. Dairy Low-fat dairy products, such as skim or 1% milk, 2% or reduced-fat cheeses, low-fat ricotta or cottage cheese, or plain low-fat yogurt. Low-sodium or reduced-sodium cheeses. Fats and Oils Tub margarines without trans fats. Light or reduced-fat mayonnaise and salad dressings (reduced sodium). Avocado. Safflower, olive, or canola oils. Natural peanut or almond butter. Other Unsalted popcorn and pretzels. The items listed above may not be a complete list of recommended foods or beverages. Contact your dietitian for more options. WHAT FOODS ARE NOT RECOMMENDED? Grains White bread. White pasta.  White rice. Refined cornbread. Bagels and croissants. Crackers that contain trans fat. Vegetables Creamed or fried vegetables. Vegetables in a cheese sauce. Regular canned vegetables. Regular canned tomato sauce and paste. Regular tomato and vegetable juices. Fruits Dried fruits. Canned fruit in light or heavy syrup. Fruit juice. Meat and Other Protein Products Fatty cuts of meat. Ribs, chicken wings, bacon, sausage, bologna, salami, chitterlings, fatback, hot dogs, bratwurst, and packaged luncheon meats. Salted nuts and seeds. Canned beans with salt. Dairy Whole or 2% milk, cream, half-and-half, and cream cheese. Whole-fat or sweetened yogurt. Full-fat cheeses or blue cheese. Nondairy creamers and whipped toppings. Processed cheese, cheese spreads, or cheese curds. Condiments Onion and garlic salt, seasoned salt, table salt, and sea salt. Canned and packaged gravies. Worcestershire sauce. Tartar sauce. Barbecue sauce. Teriyaki sauce. Soy sauce, including reduced sodium. Steak sauce. Fish sauce. Oyster sauce. Cocktail sauce. Horseradish. Ketchup and mustard. Meat flavorings and tenderizers. Bouillon cubes. Hot sauce. Tabasco sauce. Marinades. Taco seasonings. Relishes. Fats and Oils Butter, stick margarine, lard, shortening, ghee, and bacon fat. Coconut, palm kernel, or palm oils. Regular salad dressings. Other Pickles and olives. Salted popcorn and pretzels. The items listed above may not be a complete list of foods and beverages to avoid. Contact your dietitian for more information. WHERE CAN I FIND MORE INFORMATION? National Heart, Lung, and Blood Institute: [employer]   This information is not intended to replace advice given to you by your health care provider. Make sure you discuss any questions you have with your health care provider.   Document Released: 06/04/2011 Document Revised: 07/06/2014 Document Reviewed: 04/19/2013 Elsevier Interactive  Patient Education [employer]

## 2015-09-24 NOTE — Progress Notes (Signed)
Patient here for follow up from the hospital Was admitted with chest pains and had a few stents and balloon Placed Patient also states his blood pressure and his blood sugars have been all over the place

## 2015-09-24 NOTE — Telephone Encounter (Signed)
Pt. Came in stating that his PCP had told him he has medicaid. Pt. Only had [employer] medicaid. Pt. Was referred to see an ophthalmologist. Pt. Was giving financial paperwork to apply for the [place] discount.

## 2015-09-24 NOTE — Progress Notes (Signed)
Marc Walker, is a 45 y.o. male  XKP:[id number]  EML:[id number]  DOB - [birth year]  CC:  Chief Complaint  Patient presents with  . Hospitalization Follow-up       HPI: Marc Walker is a 45 y.o. male w/ hx of CAD, MI last in Sept 2015 per pt here today to establish medical care. He has significant hx of PCI, 6 stents, htn, hyperlipidemia, dm, tob abuse, was recently admitted 3/19-3/21 for chest pain.  He also has dm 2, uncontrolled, for which lantus 20 was started in house. Pt brings his glucometer with reading, ranging 100-275s last few days. He notes the high readings, so has been taking his glipizide as well.  He co of diffuse arthritis, for which he has been taking diclofenac for serveral months, sometimes w/ burning/tingling hands. Decrease sensation in legs.   Cardiology was consulted and patient underwent cardiac catheterization s/p successful proximal LAD PCI and drug-eluting stenting as well as mid RCA along with Angiosculpt balloon angioplasty of the proximal segmental PDA "in-stent restenosis. Patent Ost LAD to Prox LAD stent (unknown type). Patent Ost 2nd Mrg to 2nd Mrg stent (unknown type). 20% in-stenosed Mid RCA to Dist RCA lesion (unknown type). 20% in-stenosed Ost RPDA to RPDA lesion (unknown type). Mid Cx lesion, 70% stenosed.   Patient has No headache, No chest pain, No abdominal pain - No Nausea, No new weakness tingling or numbness, No Cough - SOB.  No Known Allergies Past Medical History  Diagnosis Date  . MI (myocardial infarction) ([place]) 07/2009    [facility] in [place], [place] for last 2, 1st one in [place], [place]; total of 6 stents, last one 02/2014   . Coronary artery disease   . Diabetes mellitus without complication ([place])   . Hypertension   . Hyperlipidemia associated with type 2 diabetes mellitus [facility])    Current Outpatient Prescriptions on File Prior to Visit  Medication Sig Dispense Refill  . amLODipine (NORVASC) 5 MG tablet Take 1 tablet (5 mg  total) by mouth daily. 30 tablet 4  . aspirin 81 MG tablet Take 81 mg by mouth daily.    Marland Kitchen atorvastatin (LIPITOR) 40 MG tablet Take 1 tablet (40 mg total) by mouth at bedtime. 30 tablet 4  . clopidogrel (PLAVIX) 75 MG tablet Take 75 mg by mouth daily.    . diclofenac (VOLTAREN) 75 MG EC tablet Take 75 mg by mouth 2 (two) times daily.    . isosorbide mononitrate (IMDUR) 60 MG 24 hr tablet Take 60 mg by mouth daily.    Marland Kitchen lisinopril (PRINIVIL,ZESTRIL) 10 MG tablet Take 1 tablet (10 mg total) by mouth daily. 30 tablet 3  . metFORMIN (GLUCOPHAGE) 500 MG tablet Take 500 mg by mouth 2 (two) times daily with a meal.     . metoprolol (LOPRESSOR) 50 MG tablet Take 50 mg by mouth 2 (two) times daily.    . nitroGLYCERIN (NITROSTAT) 0.4 MG SL tablet Place 1 tablet (0.4 mg total) under the tongue every 5 (five) minutes as needed for chest pain. 30 tablet 12  . blood glucose meter kit and supplies KIT Dispense based on patient and insurance preference. Use up to four times daily as directed. (FOR ICD-9 250.00, 250.01). 1 each 0  . Insulin Syringes, Disposable, U-100 0.5 ML MISC Take insulin as directed   Diagnosis: Insulin dependent diabetes mellitus ICD10 250 1 each 0   No current facility-administered medications on file prior to visit.   Family History  Problem  Relation Age of Onset  . Diabetes Maternal Grandmother    Social History   Social History  . Marital Status: Single    Spouse Name: N/A  . Number of Children: N/A  . Years of Education: N/A   Occupational History  . Biochemist, clinical    Social History Main Topics  . Smoking status: Current Every Day Smoker -- 1.00 packs/day for 33 years    Types: Cigarettes  . Smokeless tobacco: Never Used  . Alcohol Use: No  . Drug Use: No  . Sexual Activity: Not on file   Other Topics Concern  . Not on file   Social History Narrative   Adopted. Very little info on biological parents, ?diabetes.    Review of Systems: Constitutional:  Negative for fever, chills, diaphoresis, activity change, appetite change and fatigue.  Walks at work, but minimal activity outside of work.   HENT: Negative for ear pain, nosebleeds, congestion, facial swelling, rhinorrhea, neck pain, neck stiffness and ear discharge.  Eyes: Negative for pain, discharge, redness, itching and visual disturbance.  Last eye exam about 3 years ago. Respiratory: Negative for cough, choking, chest tightness, shortness of breath, wheezing and stridor.  Cardiovascular: Negative for chest pain, palpitations and leg swelling. Gastrointestinal: Negative for abdominal pain Genitourinary: Negative for dysuria, urgency, frequency, hematuria, flank pain, decreased urine volume, difficulty urinating and dyspareunia.  Musculoskeletal: Negative for back pain, joint swelling, + arthralgia and tired after longs walks, asked for Disability parking sticker. Neurological: Negative for dizziness, tremors, seizures, syncope, facial asymmetry, speech difficulty, weakness, light-headedness, numbness and headaches.   +tingling/burning of hands, decrease sensation feet Hematological: Negative for adenopathy. Does not bruise/bleed easily.  Some easy bleeding w/ plavix Psychiatric/Behavioral: Negative for hallucinations, behavioral problems, confusion, dysphoric mood, decreased concentration and agitation.    Objective:   Filed Vitals:   09/24/15 1510  BP: 126/77  Pulse: 81  Temp: 98 F (36.7 C)  Resp: 16    Physical Exam: Constitutional: Patient appears well-developed and well-nourished. No distress. Wearing glasses; morbid obese. aaox 3 HENT: Normocephalic, atraumatic, External right and left ear normal. Oropharynx is clear and moist.  Eyes: Conjunctivae and EOM are normal. PERRL, no scleral icterus. Neck: Normal ROM. Neck supple. No JVD. No tracheal deviation.  CVS: RRR, S1/S2 +, no murmurs, no gallops, no carotid bruit.  Pulmonary: Effort and breath sounds normal, no stridor,  rhonchi, wheezes, rales.  Abdominal: Soft. BS +, obese, no tenderness, rebound or guarding.  Musculoskeletal: Normal range of motion. No edema and no tenderness.  Neuro: Alert. Normal reflexes, muscle tone coordination. No cranial nerve deficit grossly. LE: monofilatment testing, minimal sensation noted bilat fee, pulses 1+ bilat dorsalis pedal pulses.  No ulcers noted. Skin: Skin is warm and dry. No rash noted. Not diaphoretic. No erythema. No pallor. Psychiatric: Normal mood and affect. Behavior, judgment, thought content normal.  Lab Results  Component Value Date   WBC 15.9* 09/17/2015   HGB 12.8* 09/17/2015   HCT 38.8* 09/17/2015   MCV 91.3 09/17/2015   PLT 177 09/17/2015   Lab Results  Component Value Date   CREATININE 0.74 09/17/2015   BUN 11 09/17/2015   NA 137 09/17/2015   K 3.6 09/17/2015   CL 103 09/17/2015   CO2 22 09/17/2015    Lab Results  Component Value Date   HGBA1C 11.6* 09/16/2015   Lipid Panel     Component Value Date/Time   CHOL 163 09/16/2015 0930   TRIG 168* 09/16/2015 0930   HDL 42  09/16/2015 0930   CHOLHDL 3.9 09/16/2015 0930   VLDL 34 09/16/2015 0930   LDLCALC 87 09/16/2015 0930       Assessment and plan:   1. Type 2 diabetes mellitus without complication, with long-term current use of insulin ([place]) - reviewed his glucometer,  - Glucose (CBG)  275 today, uncontrolled, a1c 11.6 (09/16/15 - will increase lantus to 15bid for now, pt wants to avoid prandial insulin if he can, continue metformin - advised to stop glipizide due to hypoglemia concerns. - fu [facility]/ [facility] 2 wks for bp/glucose chk  2. Type 2 diabetes mellitus with diabetic neuropathy, unspecified long term insulin use status (HCC) - decrease sensation on monofilament testing, - trial neuronting 300bid for now - Ambulatory referral to Ophthalmology - referral made, advised to see Otopmetry as well.  3. Essential hypertension - controlled - same regimen, encouraged to  take meds - asa 81, atorvastatin 40, plavix 75, lisinopril 10, metroprolol 50 bid  4. H/O unstable angina  - no chest pain today   5. Atherosclerosis of native coronary artery with unstable angina pectoris, unspecified whether native or transplanted heart [facility]) - see above, encouraged to take meds - ldl 87 (09/16/15)  6. Arthritis - advised to decrease nsaid use if able, including diclofenac given cardiac hx. - neurontin may help since he c/o of burning in his hands as well.  7. Tobacco dependence - use to smoke 1ppd since he was very young, since recent hospitalization, down to 5-6 cigs/day, on nicoderm patch currently,  - renewed nicoderm at 63m/daily, encouraged smoking cessation.  Return in about 2 months (around 11/24/2015).    The patient was given clear instructions to go to ER or return to medical center if symptoms don't improve, worsen or new problems develop. The patient verbalized understanding. The patient was told to call to get lab results if they haven't heard anything in the next week.      DMaren Reamer MD, [place]  09/24/2015, 3:36 PM

## 2015-09-30 ENCOUNTER — Encounter: Payer: Self-pay | Admitting: Physician Assistant

## 2015-09-30 ENCOUNTER — Ambulatory Visit (INDEPENDENT_AMBULATORY_CARE_PROVIDER_SITE_OTHER): Payer: Self-pay | Admitting: Physician Assistant

## 2015-09-30 VITALS — BP 138/88 | HR 80 | Ht 65.5 in | Wt 251.8 lb

## 2015-09-30 DIAGNOSIS — I10 Essential (primary) hypertension: Secondary | ICD-10-CM

## 2015-09-30 DIAGNOSIS — Z720 Tobacco use: Secondary | ICD-10-CM

## 2015-09-30 DIAGNOSIS — I209 Angina pectoris, unspecified: Secondary | ICD-10-CM

## 2015-09-30 DIAGNOSIS — I2510 Atherosclerotic heart disease of native coronary artery without angina pectoris: Secondary | ICD-10-CM | POA: Insufficient documentation

## 2015-09-30 DIAGNOSIS — I25118 Atherosclerotic heart disease of native coronary artery with other forms of angina pectoris: Secondary | ICD-10-CM

## 2015-09-30 MED ORDER — NICOTINE 10 MG IN INHA: 1.0000 | RESPIRATORY_TRACT | Status: DC | PRN

## 2015-09-30 MED ORDER — LISINOPRIL 10 MG PO TABS: 10.0000 mg | ORAL_TABLET | Freq: Two times a day (BID) | ORAL | Status: DC

## 2015-09-30 NOTE — Assessment & Plan Note (Signed)
Asian continues to smoke close to a pack of cigarettes daily while wearing the nicotine patch. He's having chest pain with this. Will stop the nicotine patch while he smoking. He's requesting Nicotrol Inhaler. I told him he could not smoke while using this. We will put her prescription through since this helped him stop smoking in the past.

## 2015-09-30 NOTE — Assessment & Plan Note (Addendum)
Patient has having some exertional chest tightness that is much more mild than before. He is smoking with a nicotine patch on which may be contributing to his chest pain. I told him he cannot wear a nicotine patch and smoke. EKG is unchanged today.

## 2015-09-30 NOTE — Assessment & Plan Note (Signed)
Patient had recent admission with multiple stents. Please see cath results for details. He continues to have some mild exertional angina. He is smoking and wearing a nicotine patch at the same time which could be contributing. I told him he cannot wear a nicotine patch as he is smoking. EKG unchanged. Recommend cardiac rehabilitation and follow-up with Dr. Tenny Crawoss in one to 2 months.

## 2015-09-30 NOTE — Assessment & Plan Note (Signed)
Pressure has been running up at home, especially early in the morning. It is down today after taking his medications. Recommend increase lisinopril to 10 mg twice a day so that his blood pressure will come down in the morning.

## 2015-09-30 NOTE — Patient Instructions (Signed)
Medication Instructions:  Your physician has recommended you make the following change in your medication:  1. Increase lisinopril ( 10 mg ) twice a day 2. Start Nicotrol Inhaler sent to [facility] today 3. Stop patch if continue to smoke   Labwork: -None  Testing/Procedures: -None  Follow-Up: Your physician recommends that you keep your scheduled  follow-up appointment with Dr. Tenny Crawoss   Any Other Special Instructions Will Be Listed Below (If Applicable).     If you need a refill on your cardiac medications before your next appointment, please call your pharmacy.

## 2015-09-30 NOTE — Progress Notes (Signed)
Cardiology Office Note   Date:  09/30/2015   ID:  Levar Fayson, DOB 1970-07-04, MRN [id number]  PCP:  Maren Reamer, MD  Cardiologist: Dr. Harrington Challenger Chief Complaint:chest pain    History of Present Illness: Baraka Klatt is a 45 y.o. male who presents for post hospital follow-up.  This is a 45 year old male patient who has history of MI September 2015 with 6 stents.  He was admitted 09/16/15 with recurrent chest pain and underwent successful proximal DES to the LAD and mid RCA as well as angioscope balloon angioplasty to the proximal PDA for in-stent restenosis. He had a patent ostial LAD and proximal LAD stent. He had a patent ostial second marginal to second marginal stent. Had 20% in-stent stenosis of the mid RCA to distal RCA and 20% in stent stenosis of the ostial PDA. Mid circumflex lesion 70% stenosis. Continue aspirin/Plavix/statin/lisinopril/metoprolol and Imdur. Patient also has hypertension, diabetes mellitus and HLD and is a smoker.  Patient comes in today complaining of mild exertional chest tightness. He says it's overall much better but he still is getting some. He continues to smoke despite wearing nicotine patches. He says they are not helping at all and he like the nicotine inhaler. He's used this in the past to quit smoking. His blood pressures been running extremely high. It was 192/108 this morning. He brought a lot of readings from home and they have been consistently high. His blood pressure is down since he took his medications. He says he like to quit smoking but is having a hard time.  Past Medical History  Diagnosis Date  . MI (myocardial infarction) ([place]) 07/2009    [facility] in [place], [place] for last 2, 1st one in [place], [place]; total of 6 stents, last one 02/2014   . Coronary artery disease   . Diabetes mellitus without complication ([place])   . Hypertension   . Hyperlipidemia associated with type 2 diabetes mellitus [facility])     Past Surgical History   Procedure Laterality Date  . Coronary stent placement    . Cardiac catheterization N/A 09/16/2015    Procedure: Left Heart Cath and Coronary Angiography;  Surgeon: Lorretta Harp, MD;  Location: [facility];  Service: Cardiovascular;  Laterality: N/A;  . Cardiac catheterization N/A 09/16/2015    Procedure: Coronary Stent Intervention;  Surgeon: Lorretta Harp, MD;  Location: [place] [facility];  Service: Cardiovascular;  Laterality: N/A;     Current Outpatient Prescriptions  Medication Sig Dispense Refill  . amLODipine (NORVASC) 5 MG tablet Take 1 tablet (5 mg total) by mouth daily. 30 tablet 4  . aspirin 81 MG tablet Take 81 mg by mouth daily.    Marland Kitchen atorvastatin (LIPITOR) 40 MG tablet Take 1 tablet (40 mg total) by mouth at bedtime. 30 tablet 4  . blood glucose meter kit and supplies KIT Dispense based on patient and insurance preference. Use up to four times daily as directed. (FOR ICD-9 250.00, 250.01). 1 each 0  . clopidogrel (PLAVIX) 75 MG tablet Take 75 mg by mouth daily.    . diclofenac (VOLTAREN) 75 MG EC tablet Take 75 mg by mouth 2 (two) times daily.    Marland Kitchen gabapentin (NEURONTIN) 300 MG capsule Take 1 capsule (300 mg total) by mouth 2 (two) times daily. 60 capsule 3  . insulin glargine (LANTUS) 100 UNIT/ML injection Inject 0.15 mLs (15 Units total) into the skin 2 (two) times daily. 10 mL 11  . Insulin Syringes, Disposable,  U-100 0.5 ML MISC Take insulin as directed   Diagnosis: Insulin dependent diabetes mellitus ICD10 250 1 each 0  . isosorbide mononitrate (IMDUR) 60 MG 24 hr tablet Take 60 mg by mouth daily.    Marland Kitchen lisinopril (PRINIVIL,ZESTRIL) 10 MG tablet Take 1 tablet (10 mg total) by mouth daily. 30 tablet 3  . metFORMIN (GLUCOPHAGE) 500 MG tablet Take 500 mg by mouth 2 (two) times daily with a meal.     . metoprolol (LOPRESSOR) 50 MG tablet Take 50 mg by mouth 2 (two) times daily.    . nicotine (NICODERM CQ) 14 mg/24hr patch Place 1 patch (14 mg total) onto the skin  daily. 28 patch 2  . nitroGLYCERIN (NITROSTAT) 0.4 MG SL tablet Place 1 tablet (0.4 mg total) under the tongue every 5 (five) minutes as needed for chest pain. 30 tablet 12   No current facility-administered medications for this visit.    Allergies:   Review of patient's allergies indicates no known allergies.    Social History:  The patient  reports that he has been smoking Cigarettes.  He has a 33 pack-year smoking history. He has never used smokeless tobacco. He reports that he does not drink alcohol or use illicit drugs.   Family History:  The patient's    family history includes Diabetes in his maternal grandmother.    ROS:  Please see the history of present illness.   Otherwise, review of systems are positive for none.   All other systems are reviewed and negative.    PHYSICAL EXAM: VS:  BP 138/88 mmHg  Pulse 80  Ht 5' 5.5" (1.664 m)  Wt 251 lb 12.8 oz (114.216 kg)  BMI 41.25 kg/m2 , BMI Body mass index is 41.25 kg/(m^2). GEN: Well nourished, well developed, in no acute distress Neck: no JVD, HJR, carotid bruits, or masses Cardiac:  RRR; no murmurs,gallop, rubs, thrill or heave,  Respiratory:  clear to auscultation bilaterally, normal work of breathing GI: soft, nontender, nondistended, + BS MS: no deformity or atrophy Extremities: Right groin and cath site without hematoma or hemorrhage, otherwise lower extremities without cyanosis, clubbing, edema, good distal pulses bilaterally.  Skin: warm and dry, no rash Neuro:  Strength and sensation are intact    EKG:  EKG is ordered today. The ekg ordered today demonstrates normal sinus rhythm, no acute change   Recent Labs: 09/17/2015: BUN 11; Creatinine, Ser 0.74; Hemoglobin 12.8*; Platelets 177; Potassium 3.6; Sodium 137    Lipid Panel    Component Value Date/Time   CHOL 163 09/16/2015 0930   TRIG 168* 09/16/2015 0930   HDL 42 09/16/2015 0930   CHOLHDL 3.9 09/16/2015 0930   VLDL 34 09/16/2015 0930   LDLCALC 87  09/16/2015 0930      Wt Readings from Last 3 Encounters:  09/30/15 251 lb 12.8 oz (114.216 kg)  09/24/15 250 lb (113.399 kg)  09/17/15 246 lb 14.6 oz (112 kg)      Other studies Reviewed: Additional studies/ records that were reviewed today include and review of the records demonstrates: Coronary Stent Intervention       Left Heart Cath and Coronary Angiography     Conclusion      Mid Cx lesion, 70% stenosed.  Prox LAD to Mid LAD lesion, 90% stenosed.  2nd Diag lesion, 90% stenosed.  Mid RCA to Dist RCA lesion, 20% stenosed. The lesion was previously treated with a stent (unknown type).  Ost RPDA to RPDA lesion, 20% stenosed. Post intervention, there  is a 20% residual stenosis. The lesion was previously treated with a stent (unknown type).  Mid RCA lesion, 80% stenosed. Post intervention, there is a 0% residual stenosis.  Mid LAD to Dist LAD lesion, 70% stenosed. Post intervention, there is a 0% residual stenosis. The lesion was previously treated with a stent (unknown type).     ASSESSMENT AND PLAN:  Chest pain Patient has having some exertional chest tightness that is much more mild than before. He is smoking with a nicotine patch on which may be contributing to his chest pain. I told him he cannot wear a nicotine patch and smoke. EKG is unchanged today.  CAD (coronary artery disease) Patient had recent admission with multiple stents. Please see cath results for details. He continues to have some mild exertional angina. He is smoking and wearing a nicotine patch at the same time which could be contributing. I told him he cannot wear a nicotine patch as he is smoking. EKG unchanged. Recommend cardiac rehabilitation and follow-up with Dr. Harrington Challenger in one to 2 months.  Essential hypertension Pressure has been running up at home, especially early in the morning. It is down today after taking his medications. Recommend increase lisinopril to 10 mg twice a day so that his blood  pressure will come down in the morning.  Hyperlipidemia Continue Lipitor  Tobacco abuse Asian continues to smoke close to a pack of cigarettes daily while wearing the nicotine patch. He's having chest pain with this. Will stop the nicotine patch while he smoking. He's requesting Nicotrol Inhaler. I told him he could not smoke while using this. We will put her prescription through since this helped him stop smoking in the past.    Signed, Ermalinda Barrios, PA-C  09/30/2015 11:50 AM    [address], [place], [place]  [address] Phone: [phone]; Fax: [phone]

## 2015-09-30 NOTE — Assessment & Plan Note (Signed)
-  Continue Lipitor °

## 2015-10-08 ENCOUNTER — Encounter: Payer: Self-pay | Admitting: Pharmacist

## 2015-10-08 ENCOUNTER — Ambulatory Visit: Payer: Self-pay | Attending: Internal Medicine | Admitting: Pharmacist

## 2015-10-08 DIAGNOSIS — Z794 Long term (current) use of insulin: Secondary | ICD-10-CM

## 2015-10-08 DIAGNOSIS — E119 Type 2 diabetes mellitus without complications: Secondary | ICD-10-CM

## 2015-10-08 NOTE — Patient Instructions (Addendum)
Thanks for coming to see [place]!  Keep up the great work!!!  Just check you blood sugar twice a day - first thing in the morning and before you go to bed or if you ever feel sick  Follow up with Dr. Julien Nordmann  Basic Carbohydrate Counting for Diabetes Mellitus Carbohydrate counting is a method for keeping track of the amount of carbohydrates you eat. Eating carbohydrates naturally increases the level of sugar (glucose) in your blood, so it is important for you to know the amount that is okay for you to have in every meal. Carbohydrate counting helps keep the level of glucose in your blood within normal limits. The amount of carbohydrates allowed is different for every person. A dietitian can help you calculate the amount that is right for you. Once you know the amount of carbohydrates you can have, you can count the carbohydrates in the foods you want to eat. Carbohydrates are found in the following foods:  Grains, such as breads and cereals.  Dried beans and soy products.  Starchy vegetables, such as potatoes, peas, and corn.  Fruit and fruit juices.  Milk and yogurt.  Sweets and snack foods, such as cake, cookies, candy, chips, soft drinks, and fruit drinks. CARBOHYDRATE COUNTING There are two ways to count the carbohydrates in your food. You can use either of the methods or a combination of both. Reading the "Nutrition Facts" on Packaged Food The "Nutrition Facts" is an area that is included on the labels of almost all packaged food and beverages in the [place]. It includes the serving size of that food or beverage and information about the nutrients in each serving of the food, including the grams (g) of carbohydrate per serving.  Decide the number of servings of this food or beverage that you will be able to eat or drink. Multiply that number of servings by the number of grams of carbohydrate that is listed on the label for that serving. The total will be the amount of carbohydrates  you will be having when you eat or drink this food or beverage. Learning Standard Serving Sizes of Food When you eat food that is not packaged or does not include "Nutrition Facts" on the label, you need to measure the servings in order to count the amount of carbohydrates.A serving of most carbohydrate-rich foods contains about 15 g of carbohydrates. The following list includes serving sizes of carbohydrate-rich foods that provide 15 g ofcarbohydrate per serving:   1 slice of bread (1 oz) or 1 six-inch tortilla.    of a hamburger bun or English muffin.  4-6 crackers.   cup unsweetened dry cereal.    cup hot cereal.   cup rice or pasta.    cup mashed potatoes or  of a large baked potato.  1 cup fresh fruit or one small piece of fruit.    cup canned or frozen fruit or fruit juice.  1 cup milk.   cup plain fat-free yogurt or yogurt sweetened with artificial sweeteners.   cup cooked dried beans or starchy vegetable, such as peas, corn, or potatoes.  Decide the number of standard-size servings that you will eat. Multiply that number of servings by 15 (the grams of carbohydrates in that serving). For example, if you eat 2 cups of strawberries, you will have eaten 2 servings and 30 g of carbohydrates (2 servings x 15 g = 30 g). For foods such as soups and casseroles, in which more than one food is mixed  in, you will need to count the carbohydrates in each food that is included. EXAMPLE OF CARBOHYDRATE COUNTING Sample Dinner  3 oz chicken breast.   cup of brown rice.   cup of corn.  1 cup milk.   1 cup strawberries with sugar-free whipped topping.  Carbohydrate Calculation Step 1: Identify the foods that contain carbohydrates:   Rice.   Corn.   Milk.   Strawberries. Step 2:Calculate the number of servings eaten of each:   2 servings of rice.   1 serving of corn.   1 serving of milk.   1 serving of strawberries. Step 3: Multiply each of  those number of servings by 15 g:   2 servings of rice x 15 g = 30 g.   1 serving of corn x 15 g = 15 g.   1 serving of milk x 15 g = 15 g.   1 serving of strawberries x 15 g = 15 g. Step 4: Add together all of the amounts to find the total grams of carbohydrates eaten: 30 g + 15 g + 15 g + 15 g = 75 g.   This information is not intended to replace advice given to you by your health care provider. Make sure you discuss any questions you have with your health care provider.   Document Released: 06/15/2005 Document Revised: 07/06/2014 Document Reviewed: 05/12/2013 Elsevier Interactive Patient Education [employer]2016 [employer].

## 2015-10-08 NOTE — Progress Notes (Signed)
S:    Patient arrives in good spirits.  Presents for diabetes evaluation, education, and management at the request of Dr. Julien NordmannLangeland. Patient was referred on 09/24/15.  Patient was last seen by Primary Care Provider on 09/24/15.   Patient reports adherence with medications.  Current diabetes medications include: Lantus 15 units twice a day and metformin 500 mg BID.   Patient denies hypoglycemic events. Patient has been trying to get all of his blood glucose readings <120, including post-prandial readings.   Patient reported dietary habits: working on trying to eat better.  Patient reported exercise habits:    Patient reports nocturia.  Patient reports neuropathy and is on gabapentin. Patient reports visual changes, he is waiting on a referral to opthalmology. Patient denies self foot exams.   Patient reports checking his blood glucose up to 8 times daily. He records his blood glucose readings in his phone on an app which helps him to keep track of it.    O:  Lab Results  Component Value Date   HGBA1C 11.6* 09/16/2015   There were no vitals filed for this visit.  Home fasting CBG: 83 - 219 (most <120) 2 hour post-prandial/random CBG: 100s-low 200s.  Average fasting = 128 Average post-prandial = 180  A/P: Diabetes longstanding currently uncontrolled based on A1c of 11.6 but improving based on home CBGs. Patient denies hypoglycemic events and is able to verbalize appropriate hypoglycemia management plan. Patient reports adherence with medication. Control is suboptimal due to dietary indiscretion and sedentary lifestyle.  Continue medications as prescribed. His averages over the last week have been at goal or close to it. Reviewed goal blood glucose levels with patient. Provided dietary counseling and recommended that patient only check his blood glucose twice a day until he gets the [employer] or the [employer] because he will run out of strips and not have coverage for them.    Next A1C anticipated June 2017.    Written patient instructions provided.  Total time in face to face counseling 20 minutes.   Follow up in Pharmacist Clinic Visit PRN, next visit with Dr. Julien NordmannLangeland.   Patient seen with Theresa MulliganMartin Shaughnessy, PharmD Candidate

## 2015-10-29 ENCOUNTER — Telehealth: Payer: Self-pay | Admitting: Internal Medicine

## 2015-10-29 NOTE — Telephone Encounter (Signed)
Pt. Came into facility to let his PCP know that he does not need the nicotine (NICODERM CQ) 14 mg/24hr patch because his heart doctor prescribed him the inhaler.

## 2015-10-31 MED FILL — GABAPENTIN 300 MG CAPSULE: 300 | 30 days supply | Qty: 60 | Fill #1

## 2015-10-31 MED FILL — NITROSTAT 0.4 MG TABLET SL: 0.4 | 25 days supply | Qty: 25 | Fill #1

## 2015-10-31 MED FILL — ?AMLODIPINE BESYLATE 5 MG T: 5 | 30 days supply | Qty: 30 | Fill #1

## 2015-10-31 MED FILL — LANTUS 100 UNITS/ML VIAL: 100 | 30 days supply | Qty: 10 | Fill #1

## 2015-10-31 MED FILL — LISINOPRIL 10 MG TABLET: 10 | 30 days supply | Qty: 30 | Fill #1

## 2015-10-31 MED FILL — ?ATORVASTATIN 40MG TABLET: 40 | 30 days supply | Qty: 30 | Fill #1

## 2015-11-04 ENCOUNTER — Telehealth: Payer: Self-pay | Admitting: Internal Medicine

## 2015-11-04 MED ORDER — INSULIN SYRINGES (DISPOSABLE) U-100 0.5 ML MISC: Status: DC

## 2015-11-04 MED ORDER — CLOPIDOGREL BISULFATE 75 MG PO TABS: 75.0000 mg | ORAL_TABLET | Freq: Every day | ORAL | Status: DC

## 2015-11-04 MED ORDER — LANCETS MISC: Status: DC

## 2015-11-04 NOTE — Telephone Encounter (Signed)
Patient is requesting refill for Plavix, lancents, syringes....please follow up with patient

## 2015-11-04 NOTE — Telephone Encounter (Signed)
Clopidogrel (Plavix), lancets, and syringes refills were sent to patient's pharmacy.

## 2015-11-07 ENCOUNTER — Telehealth: Payer: Self-pay | Admitting: Internal Medicine

## 2015-11-07 NOTE — Telephone Encounter (Signed)
Pt. Is requesting a prescription for his CPAP mask..... He attempted to locate the place to purchase another one but it is too expensive. He hopes he can file to his insurance....  Please follow up

## 2015-11-11 ENCOUNTER — Other Ambulatory Visit: Payer: Self-pay | Admitting: Physician Assistant

## 2015-11-13 NOTE — Telephone Encounter (Signed)
Will forward request to PCP

## 2015-11-18 NOTE — Telephone Encounter (Signed)
He needs to tell us what mask brand, size, etc so we can get him a rx for it. thanks

## 2015-11-21 NOTE — Telephone Encounter (Signed)
Clld pt - LMOVMTC re request for new CPAP mask.

## 2015-11-21 NOTE — Telephone Encounter (Signed)
Sorry, tried to find this medical supply store electronically under our pharmacy list, but not listed.  Will have to do hard copy rx for cpap when I get in office Monday. Thanks.

## 2015-11-21 NOTE — Telephone Encounter (Signed)
Pt clld back  - requesting Rx for CPAP mask to [facility], [address] [place], [address] [place] phone: [phone], fax# [phone].

## 2015-11-27 NOTE — Telephone Encounter (Signed)
Faxed request to [facility].

## 2015-11-28 ENCOUNTER — Telehealth: Payer: Self-pay | Admitting: Internal Medicine

## 2015-11-28 ENCOUNTER — Ambulatory Visit: Payer: Managed Care, Other (non HMO) | Attending: Internal Medicine | Admitting: Physician Assistant

## 2015-11-28 VITALS — BP 116/77 | HR 78 | Temp 97.8°F | Wt 245.6 lb

## 2015-11-28 DIAGNOSIS — E119 Type 2 diabetes mellitus without complications: Secondary | ICD-10-CM

## 2015-11-28 DIAGNOSIS — J181 Lobar pneumonia, unspecified organism: Principal | ICD-10-CM

## 2015-11-28 DIAGNOSIS — Z794 Long term (current) use of insulin: Secondary | ICD-10-CM

## 2015-11-28 DIAGNOSIS — J189 Pneumonia, unspecified organism: Secondary | ICD-10-CM

## 2015-11-28 LAB — POCT GLYCOSYLATED HEMOGLOBIN (HGB A1C): Hemoglobin A1C: 9.3

## 2015-11-28 LAB — GLUCOSE, POCT (MANUAL RESULT ENTRY): POC GLUCOSE: 231 mg/dL — AB (ref 70–99)

## 2015-11-28 MED ORDER — GABAPENTIN 600 MG PO TABS: 600.0000 mg | ORAL_TABLET | Freq: Two times a day (BID) | ORAL | Status: DC

## 2015-11-28 MED ORDER — AZITHROMYCIN 250 MG PO TABS
ORAL_TABLET | ORAL | Status: DC
Start: 2015-11-28 — End: 2015-12-16

## 2015-11-28 NOTE — Telephone Encounter (Signed)
Discussed pts home BP meds. He is not sure his cuff is working correctly. [facility] at [facility] BP was 116/77 Monday he was at [facility], diagnosed with pneumonia and he stated his BP readings on the monitor were 112/ 60s. Reviewed his medications.  He seems to be taking as prescribed.  Has no other complaints.  Requested to move up his appointment with Dr. Tenny Crawoss to 6/19 and does not mind seeing another provider.  It is for his convenience since [facility] [place]  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

## 2015-11-28 NOTE — Telephone Encounter (Signed)
Pt c/o BP issue: STAT if pt c/o blurred vision, one-sided weakness or slurred speech  1. What are your last 5 BP readings? 148/82  148/96   169/97   205/111  157/102  226/121    2. Are you having any other symptoms (ex. Dizziness, headache, blurred vision, passed out)? no  3. What is your BP issue? Its high

## 2015-11-28 NOTE — Progress Notes (Signed)
Patient ID: Marc Walker, male   DOB: 1971-01-02, 45 y.o.   MRN: [id number]   Marc Walker, is a 45 y.o. male  OBS:[id number]  UTM:[id number]  DOB - 12-26-1970  Chief Complaint  Patient presents with  . Hospitalization Follow-up    Pneumonia        Subjective:  Chief Complaint and HPI: Marc Walker is a 45 y.o. male here today for a f/up appt after being seen and diagnosed in the [facility] ED for pneumonia on 11/25/2015.  He went to the ED with pain in L chest and cough.  CT scan showed pneumonia.  He is feeling better/improiving. He is still coughing.  He feels like he is possibly having a reaction to Levaquin because the skin on his upper back feels tender, red,  and painful.  He has not had sun exposure to this area.  His L arm is is also red and tender.  He has had sun exposure to this area from riding in the car with the window down and his arm out the window.  He denies SOB, no tongue swelling, no tendon pain.  No rash elsewhere.   He also continues to have diabetic pain and paresthesias in his hands and feet.  Gabapentin 378m bid isn't providing relief.     ROS:   Constitutional:  No f/c, No night sweats, No unexplained weight loss. EENT:  No vision changes, No blurry vision, No hearing changes. No mouth, throat, or ear problems.  Respiratory: + cough, No SOB Cardiac: No CP, no palpitations GI:  No abd pain, No N/V/D. GU: No Urinary s/sx Musculoskeletal: No joint pain Neuro: No headache, no dizziness, no motor weakness.  Skin: No rash Endocrine:  No polydipsia. No polyuria.  Psych: Denies SI/HI  No problems updated.  ALLERGIES: Allergies  Allergen Reactions  . Levofloxacin Rash    Rash on back (not sun exposed)and hypersensitivity to skin on exposed skin/arm    PAST MEDICAL HISTORY: Past Medical History  Diagnosis Date  . [place]    [facility] [place],  last one 02/2014   . Coronary artery disease   . Diabetes mellitus without complication ([place]   . Hypertension   . Hyperlipidemia associated with type 2 diabetes mellitus ([place]     MEDICATIONS AT HOME: Prior to Admission medications   Medication Sig Start Date End Date Taking? Authorizing Provider  amLODipine (NORVASC) 5 MG tablet Take 1 tablet (5 mg total) by mouth daily. 09/17/15  Yes Ripudeep KKrystal Eaton MD  aspirin 81 MG tablet Take 81 mg by mouth daily.   Yes Historical Provider, MD  atorvastatin (LIPITOR) 40 MG tablet Take 1 tablet (40 mg total) by mouth at bedtime. 09/17/15  Yes Ripudeep KKrystal Eaton MD  blood glucose meter kit and supplies KIT Dispense based on patient and insurance preference. Use up to four times daily as directed. (FOR ICD-9 250.00, 250.01). 09/17/15  Yes Ripudeep K Rai, MD  clopidogrel (PLAVIX) 75 MG tablet Take 1 tablet (75 mg total) by mouth daily. 11/04/15  Yes DMaren Reamer MD  diclofenac (VOLTAREN) 75 MG EC tablet Take 75 mg by mouth 2 (two) times daily.   Yes Historical Provider, MD  insulin glargine (LANTUS) 100 UNIT/ML injection Inject 0.15 mLs (15 Units total) into the skin 2 (two) times daily. 09/24/15  Yes DMaren Reamer MD  Insulin Syringes, Disposable, U-100  0.5 ML MISC Take insulin as directed   Diagnosis: Insulin dependent diabetes mellitus ICD10 250 11/04/15  Yes Maren Reamer, MD  isosorbide mononitrate (IMDUR) 60 MG 24 hr tablet Take 60 mg by mouth daily.   Yes Historical Provider, MD  Lancets MISC Use a directed for up to 4 times daily testing ICD 10 E11.9 11/04/15  Yes Maren Reamer, MD  lisinopril (PRINIVIL,ZESTRIL) 10 MG tablet Take 1 tablet (10 mg total) by mouth 2 (two) times daily. 09/30/15  Yes Imogene Burn, PA-C  metFORMIN (GLUCOPHAGE) 500 MG tablet Take 500 mg by mouth 2 (two) times daily with a meal.    Yes Historical Provider, MD  metoprolol (LOPRESSOR) 50 MG tablet Take 50 mg by mouth 2 (two) times daily.   Yes Historical Provider, MD    nitroGLYCERIN (NITROSTAT) 0.4 MG SL tablet Place 1 tablet (0.4 mg total) under the tongue every 5 (five) minutes as needed for chest pain. 09/17/15  Yes Ripudeep Krystal Eaton, MD  azithromycin (ZITHROMAX) 250 MG tablet Take 2 today then 1 each day thereafter 11/28/15   Argentina Donovan, PA-C  gabapentin (NEURONTIN) 600 MG tablet Take 1 tablet (600 mg total) by mouth 2 (two) times daily. 11/28/15   Argentina Donovan, PA-C  nicotine (NICODERM CQ) 14 mg/24hr patch Place 1 patch (14 mg total) onto the skin daily. Patient not taking: Reported on 10/08/2015 09/24/15   Maren Reamer, MD  nicotine (NICOTROL) 10 MG inhaler Inhale 1 cartridge (1 continuous puffing total) into the lungs as needed for smoking cessation. Patient not taking: Reported on 10/08/2015 09/30/15   Imogene Burn, PA-C     Objective:  EXAM:   Filed Vitals:   11/28/15 1047  BP: 116/77  Pulse: 78  Temp: 97.8 F (36.6 C)  TempSrc: Oral  Weight: 245 lb 9.6 oz (111.403 kg)  SpO2: 97%    General appearance : A&OX3. NAD. Non-toxic-appearing HEENT: Atraumatic and Normocephalic.  PERRLA. EOM intact.  TM clear B. Mouth-MMM, post pharynx WNL w/o erythema, No PND.  Tongue is not swollen.  Neck: supple, no JVD. No cervical lymphadenopathy. No thyromegaly Chest/Lungs:  Breathing-non-labored, Good air entry bilaterally, breath sounds normal without rales, rhonchi, or wheezing  CVS: S1 S2 regular, no murmurs, gallops, rubs  Abdomen: Bowel sounds present, Non tender and not distended with no gaurding, rigidity or rebound. Extremities: Bilateral Lower Ext shows no edema, both legs are warm to touch with = pulse throughout Neurology:  CN II-XII grossly intact, Non focal.   Psych:  TP linear. J/I WNL. Normal speech. Appropriate eye contact and affect.  Skin:  Erythema overlying  the area of the trapezius of the upper back.  Appears sunburn-like.  No hives, urticaria, or induration.  Same appearance on L  Forearm-no other rash.   Data Review Lab  Results  Component Value Date   HGBA1C 9.3 11/28/2015   HGBA1C 11.6* 09/16/2015     Assessment & Plan   1. Left lower lobe pneumonia Stop Levofloxacin due to intolerance/skin rash/mild in severity.  Levofloxacin added to allergies. Zithromax zpack prescribed.  His condition is improving. Fluids, rest, resp care.   2. Type 2 diabetes mellitus without complication, with long-term current use of insulin (HCC) - POCT glucose (manual entry) - POCT glycosylated hemoglobin (Hb A1C) A1C is improving.  Advised increased effort on diabetic diet.   3.  Diabetic Neuropathy-Increase gabapentin to 668m bid.   Patient have been counseled extensively about nutrition and exercise  F/up  with Dr. Janne Napoleon in 2-3 weeks  The patient was given clear instructions to go to ER or return to medical center if symptoms don't improve, worsen or new problems develop. The patient verbalized understanding. The patient was told to call to get lab results if they haven't heard anything in the next week.     Freeman Caldron, PA-C [facility], [place]   11/28/2015, 11:36 AM

## 2015-12-02 ENCOUNTER — Other Ambulatory Visit: Payer: Self-pay | Admitting: *Deleted

## 2015-12-02 ENCOUNTER — Telehealth: Payer: Self-pay | Admitting: Internal Medicine

## 2015-12-02 MED ORDER — INSULIN GLARGINE 100 UNIT/ML ~~LOC~~ SOLN
15.0000 [IU] | Freq: Two times a day (BID) | SUBCUTANEOUS | Status: DC
Start: 2015-12-02 — End: 2015-12-16

## 2015-12-02 MED ORDER — CLOPIDOGREL BISULFATE 75 MG PO TABS: 75.0000 mg | ORAL_TABLET | Freq: Every day | ORAL | Status: DC

## 2015-12-02 MED ORDER — AMLODIPINE BESYLATE 5 MG PO TABS: 5.0000 mg | ORAL_TABLET | Freq: Every day | ORAL | Status: DC

## 2015-12-02 MED FILL — ?AMLODIPINE BESYLATE 5 MG T: 5 | 30 days supply | Qty: 30 | Fill #2

## 2015-12-02 MED FILL — CLOPIDOGREL 75 MG TABLET: 75 | 30 days supply | Qty: 30 | Fill #0

## 2015-12-02 MED FILL — ?ATORVASTATIN 40MG TABLET: 40 | 30 days supply | Qty: 30 | Fill #2

## 2015-12-02 MED FILL — LANTUS 100 UNITS/ML VIAL: 100 | 30 days supply | Qty: 10 | Fill #2

## 2015-12-02 NOTE — Telephone Encounter (Signed)
Medications have been refilled. The Lantus prescription had plenty of refills but was at the wrong pharmacy so I sent the prescription to the correct pharmacy.

## 2015-12-02 NOTE — Telephone Encounter (Signed)
Patient came in requesting amlodipine, plavix, lantus. Please follow up.

## 2015-12-15 NOTE — Progress Notes (Signed)
Cardiology Office Note:    Date:  12/16/2015   ID:  [name], DOB [birth year], MRN [id number]  PCP:  Maren Reamer, MD  Cardiologist:  Dr. Dorris Carnes   Electrophysiologist:  n/a  Referring MD: Maren Reamer, MD   Chief Complaint  Patient presents with  . Coronary Artery Disease    Follow up    History of Present Illness:     Chrishaun Sasso is a 45 y.o. male with a hx of CAD status post prior myocardial infarction, prior multiple stents at outside institutions, diabetes, HTN, HL, tobacco abuse. He was admitted to [facility] in 3/17 with unstable angina. LHC demonstrated mid RCA stenosis treated with DES, mid LAD stenosis involving the previous stent treated with DES, 20% RPDA in-stent restenosis treated with angioplasty. The stent in OM2 was patent. Residual disease included 90% D2 stenosis and 70% mid LCx stenosis, treated medically.  Last seen in this office by Ermalinda Barrios, PA-C in 4/17.  He was still having some residual chest discomfort at that time.  He returns for FU.   He is here alone today. He continues to smoke cigarettes. He does note dyspnea with more extreme activities. He denies orthopnea, PND or significant pedal edema. Denies syncope. Denies recurrent chest pain.   Past Medical History  Diagnosis Date  . MI (myocardial infarction) ([place]) 07/2009    [facility] in [place], [place] for last 2, 1st one in [place], [place]; total of 6 stents, last one 02/2014   . Coronary artery disease   . Diabetes mellitus without complication ([place])   . Hypertension   . Hyperlipidemia associated with type 2 diabetes mellitus [facility])     Past Surgical History  Procedure Laterality Date  . Coronary stent placement    . Cardiac catheterization N/A 09/16/2015    Procedure: Left Heart Cath and Coronary Angiography;  Surgeon: Lorretta Harp, MD;  Location: [place] [facility];  Service: Cardiovascular;  Laterality: N/A;  . Cardiac catheterization N/A 09/16/2015   Procedure: Coronary Stent Intervention;  Surgeon: Lorretta Harp, MD;  Location: [place] [facility];  Service: Cardiovascular;  Laterality: N/A;    Current Medications: Outpatient Prescriptions Prior to Visit  Medication Sig Dispense Refill  . amLODipine (NORVASC) 5 MG tablet Take 1 tablet (5 mg total) by mouth daily. 30 tablet 2  . aspirin 81 MG tablet Take 81 mg by mouth daily.    Marland Kitchen atorvastatin (LIPITOR) 40 MG tablet Take 1 tablet (40 mg total) by mouth at bedtime. 30 tablet 4  . blood glucose meter kit and supplies KIT Dispense based on patient and insurance preference. Use up to four times daily as directed. (FOR ICD-9 250.00, 250.01). 1 each 0  . clopidogrel (PLAVIX) 75 MG tablet Take 1 tablet (75 mg total) by mouth daily. 30 tablet 10  . diclofenac (VOLTAREN) 75 MG EC tablet Take 75 mg by mouth 2 (two) times daily.    Marland Kitchen gabapentin (NEURONTIN) 600 MG tablet Take 1 tablet (600 mg total) by mouth 2 (two) times daily. 60 tablet 3  . Insulin Syringes, Disposable, U-100 0.5 ML MISC Take insulin as directed   Diagnosis: Insulin dependent diabetes mellitus ICD10 250 1 each 0  . isosorbide mononitrate (IMDUR) 60 MG 24 hr tablet Take 60 mg by mouth daily.    . Lancets MISC Use a directed for up to 4 times daily testing ICD 10 E11.9 100 each 2  . lisinopril (PRINIVIL,ZESTRIL) 10 MG tablet  Take 1 tablet (10 mg total) by mouth 2 (two) times daily. 60 tablet 3  . metFORMIN (GLUCOPHAGE) 500 MG tablet Take 500 mg by mouth 2 (two) times daily with a meal.     . metoprolol (LOPRESSOR) 50 MG tablet Take 50 mg by mouth 2 (two) times daily.    . nicotine (NICOTROL) 10 MG inhaler Inhale 1 cartridge (1 continuous puffing total) into the lungs as needed for smoking cessation. 42 each 0  . nitroGLYCERIN (NITROSTAT) 0.4 MG SL tablet Place 1 tablet (0.4 mg total) under the tongue every 5 (five) minutes as needed for chest pain. 30 tablet 12  . azithromycin (ZITHROMAX) 250 MG tablet Take 2 today then 1 each day  thereafter (Patient not taking: Reported on 12/16/2015) 6 tablet 0  . insulin glargine (LANTUS) 100 UNIT/ML injection Inject 0.15 mLs (15 Units total) into the skin 2 (two) times daily. (Patient not taking: Reported on 12/16/2015) 10 mL 10  . nicotine (NICODERM CQ) 14 mg/24hr patch Place 1 patch (14 mg total) onto the skin daily. (Patient not taking: Reported on 12/16/2015) 28 patch 2   No facility-administered medications prior to visit.      Allergies:   Other and Levofloxacin   Social History   Social History  . Marital Status: Single    Spouse Name: N/A  . Number of Children: N/A  . Years of Education: N/A   Occupational History  . Biochemist, clinical    Social History Main Topics  . Smoking status: Current Every Day Smoker -- 1.00 packs/day for 33 years    Types: Cigarettes  . Smokeless tobacco: Never Used  . Alcohol Use: No  . Drug Use: No  . Sexual Activity: Not Asked   Other Topics Concern  . None   Social History Narrative   Adopted. Very little info on biological parents, ?diabetes.   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 He was adopted.   ROS:   Please see the history of present illness.    ROS All other systems reviewed and are negative.   Physical Exam:    VS:  BP 116/70 mmHg  Pulse 70  Ht '5\' 5"'  (1.651 m)  Wt 249 lb 6.4 oz (113.127 kg)  BMI 41.50 kg/m2   Physical Exam  Constitutional: He is oriented to person, place, and time. He appears well-developed and well-nourished.  HENT:  Head: Normocephalic and atraumatic.  Neck: Normal range of motion. No JVD present.  Cardiovascular: Normal rate, regular rhythm and normal heart sounds.  Exam reveals no gallop and no friction rub.   No murmur heard. Pulmonary/Chest: Effort normal and breath sounds normal. He has no wheezes. He has no rales.  Abdominal: Soft. There is no tenderness.  Musculoskeletal: Normal range of  motion. He exhibits no edema.  Neurological: He is alert and oriented to person, place, and time.  Skin: Skin is warm and dry.  Psychiatric: He has a normal mood and affect.    Wt Readings from Last 3 Encounters:  12/16/15 249 lb 6.4 oz (113.127 kg)  11/28/15 245 lb 9.6 oz (111.403 kg)  09/30/15 251 lb 12.8 oz (114.216 kg)      Studies/Labs Reviewed:     EKG:  EKG is  ordered today.  The ekg ordered today demonstrates NSR, HR 68, normal axis, QTC 427 ms, no change from prior tracing  Recent Labs: 09/17/2015: BUN  11; Creatinine, Ser 0.74; Hemoglobin 12.8*; Platelets 177; Potassium 3.6; Sodium 137   Recent Lipid Panel    Component Value Date/Time   CHOL 163 09/16/2015 0930   TRIG 168* 09/16/2015 0930   HDL 42 09/16/2015 0930   CHOLHDL 3.9 09/16/2015 0930   VLDL 34 09/16/2015 0930   LDLCALC 87 09/16/2015 0930    Additional studies/ records that were reviewed today include:   LHC 09/16/15 LAD ostial stent ok, prox 90%, mid 70% ISR, D2 90% LCx mid 70%, OM2 stent ok RCA mid 80%, dist stent ok, RPDA ostial stent ok with 20% ISR,  PCI: 2.5 x 20 mm Synergy DES to prox/mid LAD;  3.25 x 18 mm Xience Alpine DES to mid RCA; Angiosculpt POBA to RPDA   ASSESSMENT:     1. Coronary artery disease involving native coronary artery of native heart without angina pectoris   2. Essential hypertension   3. Hyperlipidemia   4. Tobacco abuse     PLAN:     In order of problems listed above:  1. CAD - He is status post multiple prior PCI procedures in the past. Most recent was in 3/17. He had a DES placed to the proximal/mid LAD and DES to the mid RCA. Today, he denies recurrent anginal symptoms. He will continue his current medical regimen which includes amlodipine, aspirin, Plavix, atorvastatin, isosorbide, lisinopril, metoprolol tartrate. We discussed the importance of smoking cessation. We also discussed the importance of diet, exercise and weight loss. I advised him to avoid using  diclofenac as much as possible. He can discuss this further with his PCP.  2. HTN - Blood pressure at home is somewhat elevated. However, it is optimal here. He has had BP checked at the fire station where it was also optimal. I have asked him to have his size of his cuff checked. He can check with the manufacturer as well. Continue current medical regimen.  3. Hyperlipidemia - Continue atorvastatin 40 mg. Arrange fasting CMET, lipids.  4. Tobacco abuse - He is trying to quit. We discussed the importance of tobacco cessation as relates to his coronary artery disease.    Medication Adjustments/Labs and Tests Ordered: Current medicines are reviewed at length with the patient today.  Concerns regarding medicines are outlined above.  Medication changes, Labs and Tests ordered today are outlined in the Patient Instructions noted below. Patient Instructions  Medication Instructions:  1. Your physician recommends that you continue on your current medications as directed. Please refer to the Current Medication list given to you today. Labwork: FASTING LIPID AND CMET  Testing/Procedures: NONE Follow-Up: Your physician wants you to follow-up in: 6 MONTHS WITH DR. Harrington Challenger.  You will receive a reminder letter in the mail two months in advance. If you don't receive a letter, please call our office to schedule the follow-up appointment. Any Other Special Instructions Will Be Listed Below (If Applicable). If you need a refill on your cardiac medications before your next appointment, please call your pharmacy.   Signed, [name], PA-C  12/16/2015 10:05 AM    [facility] [place], [place], [place]  [address] Phone: [phone]; Fax: [phone]

## 2015-12-16 ENCOUNTER — Encounter: Payer: Self-pay | Admitting: Physician Assistant

## 2015-12-16 ENCOUNTER — Ambulatory Visit (INDEPENDENT_AMBULATORY_CARE_PROVIDER_SITE_OTHER): Payer: Self-pay | Admitting: Physician Assistant

## 2015-12-16 ENCOUNTER — Encounter: Payer: Self-pay | Admitting: Internal Medicine

## 2015-12-16 ENCOUNTER — Ambulatory Visit: Payer: Self-pay | Attending: Internal Medicine | Admitting: Internal Medicine

## 2015-12-16 ENCOUNTER — Other Ambulatory Visit: Payer: Self-pay | Admitting: Pharmacist

## 2015-12-16 VITALS — BP 113/69 | HR 83 | Temp 98.6°F | Resp 18 | Ht 65.5 in | Wt 252.0 lb

## 2015-12-16 VITALS — BP 116/70 | HR 70 | Ht 65.0 in | Wt 249.4 lb

## 2015-12-16 DIAGNOSIS — I2510 Atherosclerotic heart disease of native coronary artery without angina pectoris: Secondary | ICD-10-CM | POA: Insufficient documentation

## 2015-12-16 DIAGNOSIS — E785 Hyperlipidemia, unspecified: Secondary | ICD-10-CM

## 2015-12-16 DIAGNOSIS — E119 Type 2 diabetes mellitus without complications: Secondary | ICD-10-CM

## 2015-12-16 DIAGNOSIS — I10 Essential (primary) hypertension: Secondary | ICD-10-CM | POA: Insufficient documentation

## 2015-12-16 DIAGNOSIS — Z720 Tobacco use: Secondary | ICD-10-CM

## 2015-12-16 DIAGNOSIS — E118 Type 2 diabetes mellitus with unspecified complications: Secondary | ICD-10-CM | POA: Insufficient documentation

## 2015-12-16 DIAGNOSIS — F17210 Nicotine dependence, cigarettes, uncomplicated: Secondary | ICD-10-CM | POA: Insufficient documentation

## 2015-12-16 DIAGNOSIS — Z794 Long term (current) use of insulin: Secondary | ICD-10-CM | POA: Insufficient documentation

## 2015-12-16 DIAGNOSIS — Z79899 Other long term (current) drug therapy: Secondary | ICD-10-CM | POA: Insufficient documentation

## 2015-12-16 DIAGNOSIS — E1142 Type 2 diabetes mellitus with diabetic polyneuropathy: Secondary | ICD-10-CM | POA: Insufficient documentation

## 2015-12-16 DIAGNOSIS — R600 Localized edema: Secondary | ICD-10-CM | POA: Insufficient documentation

## 2015-12-16 LAB — GLUCOSE, POCT (MANUAL RESULT ENTRY): POC GLUCOSE: 294 mg/dL — AB (ref 70–99)

## 2015-12-16 MED ORDER — HYDROCHLOROTHIAZIDE 25 MG PO TABS: 25.0000 mg | ORAL_TABLET | Freq: Every day | ORAL | Status: DC

## 2015-12-16 MED ORDER — METFORMIN HCL ER (OSM) 1000 MG PO TB24: 1000.0000 mg | ORAL_TABLET | Freq: Two times a day (BID) | ORAL | Status: DC

## 2015-12-16 MED ORDER — METFORMIN HCL ER 500 MG PO TB24: 1000.0000 mg | ORAL_TABLET | Freq: Two times a day (BID) | ORAL | Status: DC

## 2015-12-16 MED ORDER — INSULIN GLARGINE 100 UNIT/ML ~~LOC~~ SOLN: 30.0000 [IU] | Freq: Two times a day (BID) | SUBCUTANEOUS | Status: DC

## 2015-12-16 MED ORDER — GABAPENTIN 600 MG PO TABS: 600.0000 mg | ORAL_TABLET | Freq: Three times a day (TID) | ORAL | Status: DC

## 2015-12-16 MED ORDER — DICLOFENAC SODIUM 1 % TD GEL: 2.0000 g | Freq: Four times a day (QID) | TRANSDERMAL | Status: DC

## 2015-12-16 MED FILL — !LANTUS 100 UNITS/ML VIAL: 100 | 16 days supply | Qty: 10 | Fill #0

## 2015-12-16 MED FILL — HYDROCHLOROTHIAZIDE 25 MG T: 25 | 30 days supply | Qty: 30 | Fill #0

## 2015-12-16 MED FILL — GABAPENTIN 600 MG TABLET: 600 | 30 days supply | Qty: 90 | Fill #0

## 2015-12-16 MED FILL — VOLTAREN 1% GEL: 1 | 20 days supply | Qty: 100 | Fill #0

## 2015-12-16 MED FILL — METFORMIN HCL ER 500 MG TAB: 500 | 30 days supply | Qty: 120 | Fill #0

## 2015-12-16 NOTE — Patient Instructions (Addendum)
- 2 wks f/u  [facility] - pharm dm chk.  Tips for Eating Away From Home If You Have Diabetes Controlling your level of blood glucose, also known as blood sugar, can be challenging. It can be even more difficult when you do not prepare your own meals. The following tips can help you manage your diabetes when you eat away from home. PLANNING AHEAD Plan ahead if you know you will be eating away from home:  Ask your health care provider how to time meals and medicine if you are taking insulin.  Make a list of restaurants near you that offer healthy choices. If they have a carry-out menu, take it home and plan what you will order ahead of time.  Look up the restaurant you want to eat at online. Many chain and fast-food restaurants list nutritional information online. Use this information to choose the healthiest options and to calculate how many carbohydrates will be in your meal.  Use a carbohydrate-counting book or mobile app to look up the carbohydrate content and serving size of the foods you want to eat.  Become familiar with serving sizes and learn to recognize how many servings are in a portion. This will allow you to estimate how many carbohydrates you can eat. FREE FOODS A "free food" is any food or drink that has less than 5 g of carbohydrates per serving. Free foods include:  Many vegetables.  Hard boiled eggs.  Nuts or seeds.  Olives.  Cheeses.  Meats. These types of foods make good appetizer choices and are often available at salad bars. Lemon juice, vinegar, or a low-calorie salad dressing of fewer than 20 calories per serving can be used as a "free" salad dressing.  CHOICES TO REDUCE CARBOHYDRATES  Substitute nonfat sweetened yogurt with a sugar-free yogurt. Yogurt made from soy milk may also be used, but you will still want a sugar-free or plain option to choose a lower carbohydrate amount.  Ask your server to take away the bread basket or chips from your table.  Order  fresh fruit. A salad bar often offers fresh fruit choices. Avoid canned fruit because it is usually packed in sugar or syrup.  Order a salad, and eat it without dressing. Or, create a "free" salad dressing.  Ask for substitutions. For example, instead of [place] fries, request an order of a vegetable such as salad, green beans, or broccoli. OTHER TIPS   If you take insulin, take the insulin once your food arrives to your table. This will ensure your insulin and food are timed correctly.  Ask your server about the portion size before your order, and ask for a take-out box if the portion has more servings than you should have. When your food comes, leave the amount you should have on the plate, and put the rest in the take-out box.  Consider splitting an entree with someone and ordering a side salad.   This information is not intended to replace advice given to you by your health care provider. Make sure you discuss any questions you have with your health care provider.   Document Released: 06/15/2005 Document Revised: 03/06/2015 Document Reviewed: 09/12/2013 Elsevier Interactive Patient Education 2016 Elsevier Inc.  - Diabetes Mellitus and Food It is important for you to manage your blood sugar (glucose) level. Your blood glucose level can be greatly affected by what you eat. Eating healthier foods in the appropriate amounts throughout the day at about the same time each day will help you control  your blood glucose level. It can also help slow or prevent worsening of your diabetes mellitus. Healthy eating may even help you improve the level of your blood pressure and reach or maintain a healthy weight.  General recommendations for healthful eating and cooking habits include:  Eating meals and snacks regularly. Avoid going long periods of time without eating to lose weight.  Eating a diet that consists mainly of plant-based foods, such as fruits, vegetables, nuts, legumes, and whole  grains.  Using low-heat cooking methods, such as baking, instead of high-heat cooking methods, such as deep frying. Work with your dietitian to make sure you understand how to use the Nutrition Facts information on food labels. HOW CAN FOOD AFFECT ME? Carbohydrates Carbohydrates affect your blood glucose level more than any other type of food. Your dietitian will help you determine how many carbohydrates to eat at each meal and teach you how to count carbohydrates. Counting carbohydrates is important to keep your blood glucose at a healthy level, especially if you are using insulin or taking certain medicines for diabetes mellitus. Alcohol Alcohol can cause sudden decreases in blood glucose (hypoglycemia), especially if you use insulin or take certain medicines for diabetes mellitus. Hypoglycemia can be a life-threatening condition. Symptoms of hypoglycemia (sleepiness, dizziness, and disorientation) are similar to symptoms of having too much alcohol.  If your health care provider has given you approval to drink alcohol, do so in moderation and use the following guidelines:  Women should not have more than one drink per day, and men should not have more than two drinks per day. One drink is equal to:  12 oz of beer.  5 oz of wine.  1 oz of hard liquor.  Do not drink on an empty stomach.  Keep yourself hydrated. Have water, diet soda, or unsweetened iced tea.  Regular soda, juice, and other mixers might contain a lot of carbohydrates and should be counted. WHAT FOODS ARE NOT RECOMMENDED? As you make food choices, it is important to remember that all foods are not the same. Some foods have fewer nutrients per serving than other foods, even though they might have the same number of calories or carbohydrates. It is difficult to get your body what it needs when you eat foods with fewer nutrients. Examples of foods that you should avoid that are high in calories and carbohydrates but low in  nutrients include:  Trans fats (most processed foods list trans fats on the Nutrition Facts label).  Regular soda.  Juice.  Candy.  Sweets, such as cake, pie, doughnuts, and cookies.  Fried foods. WHAT FOODS CAN I EAT? Eat nutrient-rich foods, which will nourish your body and keep you healthy. The food you should eat also will depend on several factors, including:  The calories you need.  The medicines you take.  Your weight.  Your blood glucose level.  Your blood pressure level.  Your cholesterol level. You should eat a variety of foods, including:  Protein.  Lean cuts of meat.  Proteins low in saturated fats, such as fish, egg whites, and beans. Avoid processed meats.  Fruits and vegetables.  Fruits and vegetables that may help control blood glucose levels, such as apples, mangoes, and yams.  Dairy products.  Choose fat-free or low-fat dairy products, such as milk, yogurt, and cheese.  Grains, bread, pasta, and rice.  Choose whole grain products, such as multigrain bread, whole oats, and brown rice. These foods may help control blood pressure.  Fats.  Foods  containing healthful fats, such as nuts, avocado, olive oil, canola oil, and fish. DOES EVERYONE WITH DIABETES MELLITUS HAVE THE SAME MEAL PLAN? Because every person with diabetes mellitus is different, there is not one meal plan that works for everyone. It is very important that you meet with a dietitian who will help you create a meal plan that is just right for you.   This information is not intended to replace advice given to you by your health care provider. Make sure you discuss any questions you have with your health care provider.   Document Released: 03/12/2005 Document Revised: 07/06/2014 Document Reviewed: 05/12/2013 Elsevier Interactive Patient Education 2016 [employer]  -  Diabetes and Exercise Exercising regularly is important. It is not just about losing weight. It has many health  benefits, such as:  Improving your overall fitness, flexibility, and endurance.  Increasing your bone density.  Helping with weight control.  Decreasing your body fat.  Increasing your muscle strength.  Reducing stress and tension.  Improving your overall health. People with diabetes who exercise gain additional benefits because exercise:  Reduces appetite.  Improves the body's use of blood sugar (glucose).  Helps lower or control blood glucose.  Decreases blood pressure.  Helps control blood lipids (such as cholesterol and triglycerides).  Improves the body's use of the hormone insulin by:  Increasing the body's insulin sensitivity.  Reducing the body's insulin needs.  Decreases the risk for heart disease because exercising:  Lowers cholesterol and triglycerides levels.  Increases the levels of good cholesterol (such as high-density lipoproteins [HDL]) in the body.  Lowers blood glucose levels. YOUR ACTIVITY PLAN  Choose an activity that you enjoy, and set realistic goals. To exercise safely, you should begin practicing any new physical activity slowly, and gradually increase the intensity of the exercise over time. Your health care provider or diabetes educator can help create an activity plan that works for you. General recommendations include:  Encouraging children to engage in at least 60 minutes of physical activity each day.  Stretching and performing strength training exercises, such as yoga or weight lifting, at least 2 times per week.  Performing a total of at least 150 minutes of moderate-intensity exercise each week, such as brisk walking or water aerobics.  Exercising at least 3 days per week, making sure you allow no more than 2 consecutive days to pass without exercising.  Avoiding long periods of inactivity (90 minutes or more). When you have to spend an extended period of time sitting down, take frequent breaks to walk or stretch. RECOMMENDATIONS  FOR EXERCISING WITH TYPE 1 OR TYPE 2 DIABETES   Check your blood glucose before exercising. If blood glucose levels are greater than 240 mg/dL, check for urine ketones. Do not exercise if ketones are present.  Avoid injecting insulin into areas of the body that are going to be exercised. For example, avoid injecting insulin into:  The arms when playing tennis.  The legs when jogging.  Keep a record of:  Food intake before and after you exercise.  Expected peak times of insulin action.  Blood glucose levels before and after you exercise.  The type and amount of exercise you have done.  Review your records with your health care provider. Your health care provider will help you to develop guidelines for adjusting food intake and insulin amounts before and after exercising.  If you take insulin or oral hypoglycemic agents, watch for signs and symptoms of hypoglycemia. They include:  Dizziness.  Shaking.  Sweating.  Chills.  Confusion.  Drink plenty of water while you exercise to prevent dehydration or heat stroke. Body water is lost during exercise and must be replaced.  Talk to your health care provider before starting an exercise program to make sure it is safe for you. Remember, almost any type of activity is better than none.   This information is not intended to replace advice given to you by your health care provider. Make sure you discuss any questions you have with your health care provider.   Document Released: 09/05/2003 Document Revised: 10/30/2014 Document Reviewed: 11/22/2012 Elsevier Interactive Patient Education 2016 Elsevier Inc  - Low-Sodium Eating Plan Sodium raises blood pressure and causes water to be held in the body. Getting less sodium from food will help lower your blood pressure, reduce any swelling, and protect your heart, liver, and kidneys. We get sodium by adding salt (sodium chloride) to food. Most of our sodium comes from canned, boxed, and  frozen foods. Restaurant foods, fast foods, and pizza are also very high in sodium. Even if you take medicine to lower your blood pressure or to reduce fluid in your body, getting less sodium from your food is important. WHAT IS MY PLAN? Most people should limit their sodium intake to 2,300 mg a day. Your health care provider recommends that you limit your sodium intake to __________ a day.  WHAT DO I NEED TO KNOW ABOUT THIS EATING PLAN? For the low-sodium eating plan, you will follow these general guidelines:  Choose foods with a % Daily Value for sodium of less than 5% (as listed on the food label).   Use salt-free seasonings or herbs instead of table salt or sea salt.   Check with your health care provider or pharmacist before using salt substitutes.   Eat fresh foods.  Eat more vegetables and fruits.  Limit canned vegetables. If you do use them, rinse them well to decrease the sodium.   Limit cheese to 1 oz (28 g) per day.   Eat lower-sodium products, often labeled as "lower sodium" or "no salt added."  Avoid foods that contain monosodium glutamate (MSG). MSG is sometimes added to [place] food and some canned foods.  Check food labels (Nutrition Facts labels) on foods to learn how much sodium is in one serving.  Eat more home-cooked food and less restaurant, buffet, and fast food.  When eating at a restaurant, ask that your food be prepared with less salt, or no salt if possible.  HOW DO I READ FOOD LABELS FOR SODIUM INFORMATION? The Nutrition Facts label lists the amount of sodium in one serving of the food. If you eat more than one serving, you must multiply the listed amount of sodium by the number of servings. Food labels may also identify foods as:  Sodium free--Less than 5 mg in a serving.  Very low sodium--35 mg or less in a serving.  Low sodium--140 mg or less in a serving.  Light in sodium--50% less sodium in a serving. For example, if a food that usually  has 300 mg of sodium is changed to become light in sodium, it will have 150 mg of sodium.  Reduced sodium--25% less sodium in a serving. For example, if a food that usually has 400 mg of sodium is changed to reduced sodium, it will have 300 mg of sodium. WHAT FOODS CAN I EAT? Grains Low-sodium cereals, including oats, puffed wheat and rice, and shredded wheat cereals. Low-sodium crackers. Unsalted rice  and pasta. Lower-sodium bread.  Vegetables Frozen or fresh vegetables. Low-sodium or reduced-sodium canned vegetables. Low-sodium or reduced-sodium tomato sauce and paste. Low-sodium or reduced-sodium tomato and vegetable juices.  Fruits Fresh, frozen, and canned fruit. Fruit juice.  Meat and Other Protein Products Low-sodium canned tuna and salmon. Fresh or frozen meat, poultry, seafood, and fish. Lamb. Unsalted nuts. Dried beans, peas, and lentils without added salt. Unsalted canned beans. Homemade soups without salt. Eggs.  Dairy Milk. Soy milk. Ricotta cheese. Low-sodium or reduced-sodium cheeses. Yogurt.  Condiments Fresh and dried herbs and spices. Salt-free seasonings. Onion and garlic powders. Low-sodium varieties of mustard and ketchup. Fresh or refrigerated horseradish. Lemon juice.  Fats and Oils Reduced-sodium salad dressings. Unsalted butter.  Other Unsalted popcorn and pretzels.  The items listed above may not be a complete list of recommended foods or beverages. Contact your dietitian for more options. WHAT FOODS ARE NOT RECOMMENDED? Grains Instant hot cereals. Bread stuffing, pancake, and biscuit mixes. Croutons. Seasoned rice or pasta mixes. Noodle soup cups. Boxed or frozen macaroni and cheese. Self-rising flour. Regular salted crackers. Vegetables Regular canned vegetables. Regular canned tomato sauce and paste. Regular tomato and vegetable juices. Frozen vegetables in sauces. Salted [place] fries. Olives. Rosita Fire. Relishes. Sauerkraut. Salsa. Meat and Other  Protein Products Salted, canned, smoked, spiced, or pickled meats, seafood, or fish. Bacon, ham, sausage, hot dogs, corned beef, chipped beef, and packaged luncheon meats. Salt pork. Jerky. Pickled herring. Anchovies, regular canned tuna, and sardines. Salted nuts. Dairy Processed cheese and cheese spreads. Cheese curds. Blue cheese and cottage cheese. Buttermilk.  Condiments Onion and garlic salt, seasoned salt, table salt, and sea salt. Canned and packaged gravies. Worcestershire sauce. Tartar sauce. Barbecue sauce. Teriyaki sauce. Soy sauce, including reduced sodium. Steak sauce. Fish sauce. Oyster sauce. Cocktail sauce. Horseradish that you find on the shelf. Regular ketchup and mustard. Meat flavorings and tenderizers. Bouillon cubes. Hot sauce. Tabasco sauce. Marinades. Taco seasonings. Relishes. Fats and Oils Regular salad dressings. Salted butter. Margarine. Ghee. Bacon fat.  Other Potato and tortilla chips. Corn chips and puffs. Salted popcorn and pretzels. Canned or dried soups. Pizza. Frozen entrees and pot pies.  The items listed above may not be a complete list of foods and beverages to avoid. Contact your dietitian for more information.   This information is not intended to replace advice given to you by your health care provider. Make sure you discuss any questions you have with your health care provider.   Document Released: 12/05/2001 Document Revised: 07/06/2014 Document Reviewed: 04/19/2013 Elsevier Interactive Patient Education [employer] .

## 2015-12-16 NOTE — Progress Notes (Signed)
Marc Walker, is a 45 y.o. male  [id number]  [id number]  DOB - 13-Dec-1970  Chief Complaint  Patient presents with  . Diabetes        Subjective:   Marc Walker is a 45 y.o. male here today for a follow up visit for dm, with significant PMhx of htn, cad and obesity.  His sugars are quite uncontrolled, ranging in am 200-300s typically, he had one normal glucose of 109 the other day though.  He admits to eating out most times, but has stopped going to buffets, drinks no-cal sodas, and still smoking about 1/4 pack tob a day.  He likes to be called Marc Walker, Marc Walker is his dad.  States he is unable to walk much due to diffuse body aches, but trying to walk more. Problem is that when ever he walks, he starts smoking as well.  C/o of bilateral foot swelling more lately as well as nerve pain/tingling both hands as well.  Patient has No headache, No chest pain, No abdominal pain - No Nausea, No new weakness tingling or numbness, No Cough - SOB.  Problem  DM Type 2 With Diabetic Peripheral Neuropathy (Hcc)    ALLERGIES: Allergies  Allergen Reactions  . 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  Pt only wants pain medicine if absolutely necessary.  . Levofloxacin Rash    Rash on back (not sun exposed)and hypersensitivity to skin on exposed skin/arm    PAST MEDICAL HISTORY: Past Medical History  Diagnosis Date  . MI (myocardial infarction) ([place]) 07/2009    [facility] in [place], [place] for last 2, 1st one in [place], [place]; total of 6 stents, last one 02/2014   . Coronary artery disease   . Diabetes mellitus without complication ([place])   . Hypertension   . Hyperlipidemia associated with type 2 diabetes mellitus ([place])     MEDICATIONS AT HOME: Prior to Admission medications   Medication Sig Start Date End Date Taking? Authorizing Provider  amLODipine (NORVASC) 5 MG tablet Take 1 tablet (5 mg total) by mouth daily. 12/02/15  Yes Maren Reamer, MD  aspirin 81 MG tablet Take 81 mg by mouth daily.   Yes Historical Provider, MD  atorvastatin (LIPITOR) 40 MG tablet Take 1 tablet (40 mg total) by mouth at bedtime. 09/17/15  Yes Ripudeep Krystal Eaton, MD  blood glucose meter kit and supplies KIT Dispense based on patient and insurance preference. Use up to four times daily as directed. (FOR ICD-9 250.00, 250.01). 09/17/15  Yes Ripudeep K Rai, MD  clopidogrel (PLAVIX) 75 MG tablet Take 1 tablet (75 mg total) by mouth daily. 12/02/15  Yes Fay Records, MD  gabapentin (NEURONTIN) 600 MG tablet Take 1 tablet (600 mg total) by mouth 3 (three) times daily. 12/16/15  Yes Maren Reamer, MD  insulin glargine (LANTUS) 100 UNIT/ML injection Inject 0.3 mLs (30 Units total) into the skin 2 (two) times daily. 12/16/15  Yes Maren Reamer, MD  Insulin Syringes, Disposable, U-100 0.5 ML MISC Take insulin as directed   Diagnosis: Insulin dependent diabetes mellitus ICD10 250 11/04/15  Yes Maren Reamer, MD  isosorbide mononitrate (IMDUR) 60 MG 24 hr tablet Take 60 mg by mouth daily.   Yes Historical Provider, MD  Lancets MISC Use a directed for up to 4 times daily testing ICD 10 E11.9 11/04/15  Yes Maren Reamer, MD  lisinopril (PRINIVIL,ZESTRIL) 10 MG tablet  Take 1 tablet (10 mg total) by mouth 2 (two) times daily. 09/30/15  Yes Imogene Burn, PA-C  metoprolol (LOPRESSOR) 50 MG tablet Take 50 mg by mouth 2 (two) times daily.   Yes Historical Provider, MD  nicotine (NICOTROL) 10 MG inhaler Inhale 1 cartridge (1 continuous puffing total) into the lungs as needed for smoking cessation. 09/30/15  Yes Imogene Burn, PA-C  nitroGLYCERIN (NITROSTAT) 0.4 MG SL tablet Place 1 tablet (0.4 mg total) under the tongue every 5 (five) minutes as needed for chest pain. 09/17/15  Yes Ripudeep Krystal Eaton, MD  diclofenac sodium (VOLTAREN) 1 % GEL Apply 2 g topically 4 (four) times daily. 12/16/15   Maren Reamer, MD  hydrochlorothiazide (HYDRODIURIL) 25 MG tablet Take 1 tablet  (25 mg total) by mouth daily. 12/16/15   Maren Reamer, MD  metFORMIN (GLUCOPHAGE XR) 500 MG 24 hr tablet Take 2 tablets (1,000 mg total) by mouth 2 (two) times daily with a meal. 12/16/15   Maren Reamer, MD     Objective:   Filed Vitals:   12/16/15 1548  BP: 113/69  Pulse: 83  Temp: 98.6 F (37 C)  TempSrc: Oral  Resp: 18  Height: 5' 5.5" (1.664 m)  Weight: 252 lb (114.306 kg)  SpO2: 95%    Exam General appearance : Awake, alert, not in any distress. Speech Clear. Not toxic looking, morbid obese, pleasant HEENT: Atraumatic and Normocephalic, pupils equally reactive to light. Neck: supple, no JVD. Chest:Good air entry bilaterally, no added sounds. CVS: S1 S2 regular, no murmurs/gallups or rubs. Abdomen: Bowel sounds active, Obese, Non tender. Extremities: B/L Lower Ext shows TRACE edema, both legs are warm to touch Neurology: Awake alert, and oriented X 3, CN II-XII grossly intact, Non focal Skin:No Rash  Data Review Lab Results  Component Value Date   HGBA1C 9.3 11/28/2015   HGBA1C 11.6* 09/16/2015    Depression screen PHQ 2/9 11/28/2015  Decreased Interest 0  Down, Depressed, Hopeless 0  PHQ - 2 Score 0  Altered sleeping 3  Tired, decreased energy 3  Change in appetite 3  Feeling bad or failure about yourself  0  Trouble concentrating 0  Moving slowly or fidgety/restless 1  Suicidal thoughts 0  PHQ-9 Score 10  Difficult doing work/chores Somewhat difficult      Assessment & Plan   1. Diabetes mellitus w/ diabetic peripheral neuropathy (HCC)   Uncontrolled, dietary noncompliance a problem - Glucose (CBG) 295 - Microalbumin/Creatinine Ratio, Urine - Ambulatory referral to diabetic education - info given about dm foods, tips on eating out. - increase lantus to 30bid, chg metformin to xr 1030m po bid - dm chk w/ [facility] in 2 wks  2. Essential hypertension - controlled, but eats out a lot and retaining some fluid - added hctz 25 qd -  recd low salt diet  3. Coronary artery disease involving native coronary artery of native heart without angina pectoris Stable per cards  4. Bilateral leg edema, mild - hctz started, diet indiscretions problem  5.  Morbid obesity, unspecified obesity type ([place] Recd more low impact exercise, ie swimming.  6. tob abuse - continue to encourage complete cessation.     Patient have been counseled extensively about nutrition and exercise  Return in about 3 months (around 03/17/2016).  He likes to be called Marc Walker Mr. SBreitis his dad.  The patient was given clear instructions to go to ER or return to medical center if symptoms don't  improve, worsen or new problems develop. The patient verbalized understanding. The patient was told to call to get lab results if they haven't heard anything in the next week.    Maren Reamer, MD, [facility] [facility] [place], [place]   12/16/2015, 5:08 PM

## 2015-12-16 NOTE — Patient Instructions (Addendum)
Medication Instructions:  1. Your physician recommends that you continue on your current medications as directed. Please refer to the Current Medication list given to you today. Labwork: FASTING LIPID AND CMET  Testing/Procedures: NONE Follow-Up: Your physician wants you to follow-up in: 6 MONTHS WITH DR. Tenny CrawOSS.  You will receive a reminder letter in the mail two months in advance. If you don't receive a letter, please call our office to schedule the follow-up appointment. Any Other Special Instructions Will Be Listed Below (If Applicable). If you need a refill on your cardiac medications before your next appointment, please call your pharmacy.

## 2015-12-16 NOTE — Progress Notes (Signed)
Patient is here for FU DM  Patient complains of right hand numbness.  Patient has taken medication today and patient has eaten.

## 2015-12-17 ENCOUNTER — Other Ambulatory Visit (INDEPENDENT_AMBULATORY_CARE_PROVIDER_SITE_OTHER): Payer: Self-pay | Admitting: *Deleted

## 2015-12-17 DIAGNOSIS — E785 Hyperlipidemia, unspecified: Secondary | ICD-10-CM

## 2015-12-17 DIAGNOSIS — I10 Essential (primary) hypertension: Secondary | ICD-10-CM

## 2015-12-17 DIAGNOSIS — I2510 Atherosclerotic heart disease of native coronary artery without angina pectoris: Secondary | ICD-10-CM

## 2015-12-17 LAB — MICROALBUMIN / CREATININE URINE RATIO
Creatinine, Urine: 195 mg/dL (ref 20–370)
MICROALB UR: 10 mg/dL — AB
Microalb Creat Ratio: 51 mcg/mg creat — ABNORMAL HIGH (ref <30)

## 2015-12-17 LAB — COMPREHENSIVE METABOLIC PANEL
ALBUMIN: 3.8 g/dL (ref 3.6–5.1)
ALK PHOS: 66 U/L (ref 40–115)
ALT: 10 U/L (ref 9–46)
AST: 8 U/L — AB (ref 10–40)
BILIRUBIN TOTAL: 0.3 mg/dL (ref 0.2–1.2)
BUN: 14 mg/dL (ref 7–25)
CALCIUM: 8.8 mg/dL (ref 8.6–10.3)
CO2: 23 mmol/L (ref 20–31)
Chloride: 104 mmol/L (ref 98–110)
Creat: 0.65 mg/dL (ref 0.60–1.35)
Glucose, Bld: 287 mg/dL — ABNORMAL HIGH (ref 65–99)
Potassium: 4.7 mmol/L (ref 3.5–5.3)
Sodium: 136 mmol/L (ref 135–146)
TOTAL PROTEIN: 6.6 g/dL (ref 6.1–8.1)

## 2015-12-17 LAB — LIPID PANEL
CHOL/HDL RATIO: 4.8 ratio (ref <=5.0)
CHOLESTEROL: 181 mg/dL (ref 125–200)
HDL: 38 mg/dL — ABNORMAL LOW (ref >=40)
LDL Cholesterol: 113 mg/dL (ref <130)
Triglycerides: 150 mg/dL — ABNORMAL HIGH (ref <150)
VLDL: 30 mg/dL (ref <30)

## 2015-12-18 ENCOUNTER — Other Ambulatory Visit: Payer: Self-pay | Admitting: *Deleted

## 2015-12-18 MED ORDER — INSULIN GLARGINE 100 UNIT/ML ~~LOC~~ SOLN: 15.0000 [IU] | Freq: Two times a day (BID) | SUBCUTANEOUS | Status: DC

## 2015-12-18 NOTE — Telephone Encounter (Signed)
PASS PROGRAM 

## 2015-12-19 ENCOUNTER — Telehealth: Payer: Self-pay | Admitting: *Deleted

## 2015-12-19 DIAGNOSIS — I2510 Atherosclerotic heart disease of native coronary artery without angina pectoris: Secondary | ICD-10-CM

## 2015-12-19 DIAGNOSIS — E785 Hyperlipidemia, unspecified: Secondary | ICD-10-CM

## 2015-12-19 MED ORDER — ATORVASTATIN CALCIUM 80 MG PO TABS: 80.0000 mg | ORAL_TABLET | Freq: Every day | ORAL | Status: DC

## 2015-12-19 MED FILL — ATORVASTATIN 80 MG TABLET: 80 | 30 days supply | Qty: 30 | Fill #0

## 2015-12-19 NOTE — Telephone Encounter (Signed)
Lvm with pt's mother for ptcb for results. Mother took [phone].

## 2015-12-19 NOTE — Telephone Encounter (Signed)
Noted and shared with PCP

## 2015-12-19 NOTE — Telephone Encounter (Signed)
Pt notified of lab results and findings. Pt agreeable to increase lipitor to 80 mg daily, FLP/LFT 9/25.Marland Kitchen. New Rx sent in for lipitor 80 mg .

## 2015-12-30 ENCOUNTER — Ambulatory Visit: Payer: Self-pay | Admitting: Internal Medicine

## 2016-01-01 ENCOUNTER — Telehealth: Payer: Self-pay | Admitting: Internal Medicine

## 2016-01-01 ENCOUNTER — Ambulatory Visit: Payer: Self-pay | Attending: Internal Medicine | Admitting: Pharmacist

## 2016-01-01 DIAGNOSIS — E119 Type 2 diabetes mellitus without complications: Secondary | ICD-10-CM

## 2016-01-01 MED ORDER — INSULIN ASPART 100 UNIT/ML ~~LOC~~ SOLN: 10.0000 [IU] | Freq: Three times a day (TID) | SUBCUTANEOUS | Status: DC

## 2016-01-01 MED FILL — CLOPIDOGREL 75 MG TABLET: 75 | 30 days supply | Qty: 30 | Fill #1

## 2016-01-01 MED FILL — ISOSORBIDE MN ER 60 MG TAB: 60 | 30 days supply | Qty: 30 | Fill #0

## 2016-01-01 MED FILL — ?METOPROLOL 50 MG TABLET: 50 | 30 days supply | Qty: 60 | Fill #0

## 2016-01-01 MED FILL — ?LISINOPRIL 10 MG TABLET: 10 | 30 days supply | Qty: 60 | Fill #0

## 2016-01-01 MED FILL — !LANTUS 100 UNITS/ML VIAL: 100 | 16 days supply | Qty: 10 | Fill #1

## 2016-01-01 MED FILL — ?AMLODIPINE BESYLATE 5 MG T: 5 | 30 days supply | Qty: 30 | Fill #3

## 2016-01-01 NOTE — Patient Instructions (Signed)
Thanks for coming to see me!  Start Novolog 10 units 15 minutes before meals - do not take if you are not going to eat  Take the Lantus as prescribed - 30 units twice a day until we know how you will react to the Novolog  Come back in 1 week to see me  Hypoglycemia Hypoglycemia occurs when the glucose in your blood is too low. Glucose is a type of sugar that is your body's main energy source. Hormones, such as insulin and glucagon, control the level of glucose in the blood. Insulin lowers blood glucose and glucagon increases blood glucose. Having too much insulin in your blood stream, or not eating enough food containing sugar, can result in hypoglycemia. Hypoglycemia can happen to people with or without diabetes. It can develop quickly and can be a medical emergency.  CAUSES   Missing or delaying meals.  Not eating enough carbohydrates at meals.  Taking too much diabetes medicine.  Not timing your oral diabetes medicine or insulin doses with meals, snacks, and exercise.  Nausea and vomiting.  Certain medicines.  Severe illnesses, such as hepatitis, kidney disorders, and certain eating disorders.  Increased activity or exercise without eating something extra or adjusting medicines.  Drinking too much alcohol.  A nerve disorder that affects body functions like your heart rate, blood pressure, and digestion (autonomic neuropathy).  A condition where the stomach muscles do not function properly (gastroparesis). Therefore, medicines and food may not absorb properly.  Rarely, a tumor of the pancreas can produce too much insulin. SYMPTOMS   Hunger.  Sweating (diaphoresis).  Change in body temperature.  Shakiness.  Headache.  Anxiety.  Lightheadedness.  Irritability.  Difficulty concentrating.  Dry mouth.  Tingling or numbness in the hands or feet.  Restless sleep or sleep disturbances.  Altered speech and coordination.  Change in mental status.  Seizures or  prolonged convulsions.  Combativeness.  Drowsiness (lethargic).  Weakness.  Increased heart rate or palpitations.  Confusion.  Pale, gray skin color.  Blurred or double vision.  Fainting. DIAGNOSIS  A physical exam and medical history will be performed. Your caregiver may make a diagnosis based on your symptoms. Blood tests and other lab tests may be performed to confirm a diagnosis. Once the diagnosis is made, your caregiver will see if your signs and symptoms go away once your blood glucose is raised.  TREATMENT  Usually, you can easily treat your hypoglycemia when you notice symptoms.  Check your blood glucose. If it is less than 70 mg/dl, take one of the following:   3-4 glucose tablets.    cup juice.    cup regular soda.   1 cup skim milk.   -1 tube of glucose gel.   5-6 hard candies.   Avoid high-fat drinks or food that may delay a rise in blood glucose levels.  Do not take more than the recommended amount of sugary foods, drinks, gel, or tablets. Doing so will cause your blood glucose to go too high.   Wait 10-15 minutes and recheck your blood glucose. If it is still less than 70 mg/dl or below your target range, repeat treatment.   Eat a snack if it is more than 1 hour until your next meal.  There may be a time when your blood glucose may go so low that you are unable to treat yourself at home when you start to notice symptoms. You may need someone to help you. You may even faint or be unable  to swallow. If you cannot treat yourself, someone will need to bring you to the hospital.  HOME CARE INSTRUCTIONS  If you have diabetes, follow your diabetes management plan by:  Taking your medicines as directed.  Following your exercise plan.  Following your meal plan. Do not skip meals. Eat on time.  Testing your blood glucose regularly. Check your blood glucose before and after exercise. If you exercise longer or different than usual, be sure to  check blood glucose more frequently.  Wearing your medical alert jewelry that says you have diabetes.  Identify the cause of your hypoglycemia. Then, develop ways to prevent the recurrence of hypoglycemia.  Do not take a hot bath or shower right after an insulin shot.  Always carry treatment with you. Glucose tablets are the easiest to carry.  If you are going to drink alcohol, drink it only with meals.  Tell friends or family members ways to keep you safe during a seizure. This may include removing hard or sharp objects from the area or turning you on your side.  Maintain a healthy weight. SEEK MEDICAL CARE IF:   You are having problems keeping your blood glucose in your target range.  You are having frequent episodes of hypoglycemia.  You feel you might be having side effects from your medicines.  You are not sure why your blood glucose is dropping so low.  You notice a change in vision or a new problem with your vision. SEEK IMMEDIATE MEDICAL CARE IF:   Confusion develops.  A change in mental status occurs.  The inability to swallow develops.  Fainting occurs.   This information is not intended to replace advice given to you by your health care provider. Make sure you discuss any questions you have with your health care provider.   Document Released: 06/15/2005 Document Revised: 06/20/2013 Document Reviewed: 02/19/2015 Elsevier Interactive Patient Education [employer]2016 [employer] Inc.

## 2016-01-01 NOTE — Telephone Encounter (Signed)
Patient called would like to know if new insulin prescribed Novalog has to be refrigerated. Please f/up

## 2016-01-02 NOTE — Telephone Encounter (Signed)
Returned patient's call and reviewed proper storage of both the Lantus and Novolog. Patient verbalized understanding.

## 2016-01-02 NOTE — Progress Notes (Signed)
    S:    Patient arrives in good spirits.  Presents for diabetes evaluation, education, and management at the request of Dr. Julien NordmannLangeland. Patient was referred on 12/16/15.  Patient was last seen by Primary Care Provider on 12/16/15.   Patient reports adherence with medications BUT reports taking varying doses of Lantus, anywhere from 30 units twice a day up to 100 units twice a day.  Current diabetes medications include:Lantus 30 units BID and metformin 1000 mg BID.  Patient denies hypoglycemic events.  Patient reported dietary habits: Breakfast: grits, eggs, sausage or bacon Lunch:[employer] Dinner:a piece of bologna  Drinks: coffee with splenda  Patient reported exercise habits: trying to join the [employer] to do water aerobics.   Patient reports nocturia.  Patient reports neuropathy. Patient reports chronic visual changes. He just received a new eyeglasses prescription but he cannot afford the glasses Patient reports self foot exams.   He denies any s/sx of infection, including wounds that will not heal.   O:  Lab Results  Component Value Date   HGBA1C 9.3 11/28/2015   There were no vitals filed for this visit.  Home fasting CBG: 81 - 239  2 hour post-prandial/random CBG: 116 - 393  A/P: Diabetes longstanding currently UNcontrolled based on A1c of 9.3 and home CBGs. Patient denies hypoglycemic events and is able to verbalize appropriate hypoglycemia management plan. Patient reports adherence with medication. Control is suboptimal due to dietary indiscretion and sedentary lifestyle, as well as varying Lantus doses.  Continued basal insulin Lantus (insulin glargine) at 30 units BID and instructed patient not to self titrate due to the danger of hypoglycemia. Started rapid insulin Novolog (insulin aspart) 10 units prior to each meal. I am not sure if he was actually using up to 100 units of Lantus twice a day for a total of 200 units. If so, and the Novolog does not help to control his  blood glucose, we may need to consider a referral to endocrinology for U500 insulin. However, I think the post-prandial coverage will really help him and that once the doses are titrated to more of a 50/50 split between basal and bolus insulin, that we will be able to control his blood glucose.   Next A1C anticipated September 2017.    Written patient instructions provided, including information on hypoglycemia.  Total time in face to face counseling 30 minutes.   Follow up in Pharmacist Clinic Visit in 1 week.   Patient seen with Ginger CarneJennifer Otter, PharmD Candidate.

## 2016-01-07 ENCOUNTER — Telehealth: Payer: Self-pay | Admitting: Pharmacist

## 2016-01-07 MED ORDER — INSULIN ASPART 100 UNIT/ML ~~LOC~~ SOLN: 15.0000 [IU] | Freq: Three times a day (TID) | SUBCUTANEOUS | Status: DC

## 2016-01-07 NOTE — Addendum Note (Signed)
Addended by: Juanita CraverKARL, Feliza Diven A on: 01/07/2016 03:34 PM   Modules accepted: Orders

## 2016-01-07 NOTE — Telephone Encounter (Signed)
Patient called to report that he is still having elevated blood glucose in the 200s, with an occasional 190s. Nothing less that 190 since decreasing his Lantus dose. Patient is taking Lantus as directed (30 units BID) and Novolog 10 units prior to meals. Instructed patient to increase Novolog to 15 units prior to meals and to remember to skip the Novolog if he does not eat. I think that this will help to balance out the totals (basal total 60 units and bolus total will now be 45 units total). Reviewed hypoglycemia management with patient. Patient to call with any hypoglycemia. Patient verbalized understanding.

## 2016-01-08 ENCOUNTER — Ambulatory Visit: Payer: Self-pay | Admitting: Pharmacist

## 2016-01-20 ENCOUNTER — Other Ambulatory Visit: Payer: Self-pay | Admitting: Internal Medicine

## 2016-01-20 MED ORDER — INSULIN ASPART 100 UNIT/ML ~~LOC~~ SOLN: 15.0000 [IU] | Freq: Three times a day (TID) | SUBCUTANEOUS | 3 refills | Status: DC

## 2016-01-20 NOTE — Telephone Encounter (Signed)
Will forward pt request for different medication to PCP.

## 2016-01-20 NOTE — Telephone Encounter (Signed)
Patient states that the diclofenac is ineffective and would like an alternative or something better. Patient also stated that the gabapentin is ineffective and said his fingertips are still numb.   Patient would like to speak with the nurse to discuss.. Please follow up.

## 2016-01-20 NOTE — Telephone Encounter (Signed)
Novolog refilled.

## 2016-01-20 NOTE — Telephone Encounter (Signed)
Pt.  Came into facility requesting a refill on insulin aspart (NOVOLOG) 100 UNIT/ML injection.  Please f/u with pt.

## 2016-01-27 MED FILL — ?METOPROLOL 50 MG TABLET: 50 | 30 days supply | Qty: 60 | Fill #1

## 2016-01-27 MED FILL — LISINOPRIL 10 MG TABLET: 10 | 30 days supply | Qty: 60 | Fill #1

## 2016-01-27 MED FILL — !NOVOLOG 100UNITS/ML VIAL: 100/ML | 22 days supply | Qty: 10 | Fill #0

## 2016-01-27 MED FILL — CLOPIDOGREL 75 MG TABLET: 75 | 30 days supply | Qty: 30 | Fill #2

## 2016-01-27 MED FILL — ?AMLODIPINE BESYLATE 5 MG T: 5 | 30 days supply | Qty: 30 | Fill #4

## 2016-01-27 MED FILL — ISOSORBIDE MN ER 60 MG TAB: 60 | 30 days supply | Qty: 30 | Fill #1

## 2016-01-28 MED FILL — HYDROCHLOROTHIAZIDE 25 MG T: 25 | 30 days supply | Qty: 30 | Fill #1

## 2016-01-28 MED FILL — GABAPENTIN 600 MG TABLET: 600 | 30 days supply | Qty: 90 | Fill #1

## 2016-01-28 MED FILL — ATORVASTATIN 80 MG TABLET: 80 | 30 days supply | Qty: 30 | Fill #1

## 2016-01-28 MED FILL — METFORMIN HCL ER 500 MG TAB: 500 | 30 days supply | Qty: 120 | Fill #1

## 2016-01-29 MED FILL — $LANTUS 100 UNITS/ML VIAL: 100 | 10 days supply | Qty: 10 | Fill #0

## 2016-01-30 ENCOUNTER — Telehealth: Payer: Self-pay | Admitting: Internal Medicine

## 2016-01-30 NOTE — Telephone Encounter (Signed)
Please resend the RX for the patient. Patient is needing the lantus 30 units Bid. Currently his prescription is 15U BID

## 2016-01-31 ENCOUNTER — Other Ambulatory Visit: Payer: Self-pay | Admitting: Internal Medicine

## 2016-01-31 MED ORDER — INSULIN GLARGINE 100 UNIT/ML ~~LOC~~ SOLN: 30.0000 [IU] | Freq: Two times a day (BID) | SUBCUTANEOUS | 3 refills | Status: DC

## 2016-01-31 NOTE — Telephone Encounter (Signed)
Changed lantus to 30bid.

## 2016-02-05 ENCOUNTER — Emergency Department (HOSPITAL_BASED_OUTPATIENT_CLINIC_OR_DEPARTMENT_OTHER)
Admission: EM | Admit: 2016-02-05 | Discharge: 2016-02-05 | Disposition: A | Discharge status: Home / Self Care | Payer: Self-pay | Attending: Emergency Medicine | Admitting: Emergency Medicine

## 2016-02-05 ENCOUNTER — Emergency Department (HOSPITAL_BASED_OUTPATIENT_CLINIC_OR_DEPARTMENT_OTHER): Payer: Self-pay

## 2016-02-05 DIAGNOSIS — I10 Essential (primary) hypertension: Secondary | ICD-10-CM | POA: Insufficient documentation

## 2016-02-05 DIAGNOSIS — I2510 Atherosclerotic heart disease of native coronary artery without angina pectoris: Secondary | ICD-10-CM | POA: Insufficient documentation

## 2016-02-05 DIAGNOSIS — Z7984 Long term (current) use of oral hypoglycemic drugs: Secondary | ICD-10-CM | POA: Insufficient documentation

## 2016-02-05 DIAGNOSIS — M25552 Pain in left hip: Secondary | ICD-10-CM | POA: Insufficient documentation

## 2016-02-05 DIAGNOSIS — Z7982 Long term (current) use of aspirin: Secondary | ICD-10-CM | POA: Insufficient documentation

## 2016-02-05 DIAGNOSIS — E119 Type 2 diabetes mellitus without complications: Secondary | ICD-10-CM | POA: Insufficient documentation

## 2016-02-05 DIAGNOSIS — Z794 Long term (current) use of insulin: Secondary | ICD-10-CM | POA: Insufficient documentation

## 2016-02-05 DIAGNOSIS — Z79899 Other long term (current) drug therapy: Secondary | ICD-10-CM | POA: Insufficient documentation

## 2016-02-05 DIAGNOSIS — F17210 Nicotine dependence, cigarettes, uncomplicated: Secondary | ICD-10-CM | POA: Insufficient documentation

## 2016-02-05 NOTE — ED Notes (Signed)
PA at bedside.

## 2016-02-05 NOTE — ED Triage Notes (Signed)
Pt c/o left hip pain worsening over last couple of weeks. Pt has hx of arthritis.

## 2016-02-05 NOTE — ED Provider Notes (Signed)
[facility] Provider Note   CSN: [id number] Arrival date & time: 02/05/16  2032  First Provider Contact:  None    By signing my name below, I, Emmanuella Mensah, attest that this documentation has been prepared under the direction and in the presence of Montine Circle, PA-C. Electronically Signed: Judithann Sauger, ED Scribe. 02/05/16. 8:57 PM.    History   Chief Complaint Chief Complaint  Patient presents with  . Hip Pain    HPI Comments: Marc Walker is a 45 y.o. male with a hx of arthritis, DM, and hypertension who presents to the Emergency Department complaining of gradually worsening intermittent moderate left hip pain onset several weeks ago. He reports associated lower back pain that intermittently radiates down the lateral aspect of his bilateral upper leg. He states that it took him approx 20 minutes to get out of his car due to the pain. No alleviating factors noted. Pt has not tried any medications PTA. He denies any recent falls, injuries, or trauma. He denies any fever, chills, rash, or any open wounds.   PCP: Dr. Janne Napoleon   The history is provided by the patient. No language interpreter was used.    Past Medical History:  Diagnosis Date  . Coronary artery disease   . Diabetes mellitus without complication ([place])   . Hyperlipidemia associated with type 2 diabetes mellitus ([place])   . Hypertension   . MI (myocardial infarction) ([place]) 07/2009   [facility] in [place], [place] for last 2, 1st one in [place], [place]; total of 6 stents, last one 02/2014     Patient Active Problem List   Diagnosis Date Noted  . DM type 2 with diabetic peripheral neuropathy ([place]) 12/16/2015  . CAD (coronary artery disease) 09/30/2015  . Tobacco abuse 09/30/2015  . Essential hypertension 09/16/2015  . Diabetes mellitus without complication ([place])   . Hyperlipidemia associated with type 2 diabetes mellitus ([place])   . Hyperlipidemia   . Chest pain 09/15/2015  . MI  (myocardial infarction) ([place]) 07/30/2009    Past Surgical History:  Procedure Laterality Date  . CARDIAC CATHETERIZATION N/A 09/16/2015   Procedure: Left Heart Cath and Coronary Angiography;  Surgeon: Lorretta Harp, MD;  Location: [facility];  Service: Cardiovascular;  Laterality: N/A;  . CARDIAC CATHETERIZATION N/A 09/16/2015   Procedure: Coronary Stent Intervention;  Surgeon: Lorretta Harp, MD;  Location: [facility];  Service: Cardiovascular;  Laterality: N/A;  . CORONARY STENT PLACEMENT         Home Medications    Prior to Admission medications   Medication Sig Start Date End Date Taking? Authorizing Provider  amLODipine (NORVASC) 5 MG tablet Take 1 tablet (5 mg total) by mouth daily. 12/02/15   Maren Reamer, MD  aspirin 81 MG tablet Take 81 mg by mouth daily.    Historical Provider, MD  atorvastatin (LIPITOR) 80 MG tablet Take 1 tablet (80 mg total) by mouth at bedtime. 12/19/15   Liliane Shi, PA-C  blood glucose meter kit and supplies KIT Dispense based on patient and insurance preference. Use up to four times daily as directed. (FOR ICD-9 250.00, 250.01). 09/17/15   Ripudeep Krystal Eaton, MD  clopidogrel (PLAVIX) 75 MG tablet Take 1 tablet (75 mg total) by mouth daily. 12/02/15   Fay Records, MD  diclofenac sodium (VOLTAREN) 1 % GEL Apply 2 g topically 4 (four) times daily. 12/16/15   Maren Reamer, MD  gabapentin (NEURONTIN) 600 MG tablet  Take 1 tablet (600 mg total) by mouth 3 (three) times daily. 12/16/15   Maren Reamer, MD  hydrochlorothiazide (HYDRODIURIL) 25 MG tablet Take 1 tablet (25 mg total) by mouth daily. 12/16/15   Maren Reamer, MD  insulin aspart (NOVOLOG) 100 UNIT/ML injection Inject 15 Units into the skin 3 (three) times daily before meals. 01/20/16   Tresa Garter, MD  insulin glargine (LANTUS) 100 UNIT/ML injection Inject 0.3 mLs (30 Units total) into the skin 2 (two) times daily. 01/31/16   Maren Reamer, MD  Insulin Syringes,  Disposable, U-100 0.5 ML MISC Take insulin as directed   Diagnosis: Insulin dependent diabetes mellitus ICD10 250 11/04/15   Maren Reamer, MD  isosorbide mononitrate (IMDUR) 60 MG 24 hr tablet Take 60 mg by mouth daily.    Historical Provider, MD  Lancets MISC Use a directed for up to 4 times daily testing ICD 10 E11.9 11/04/15   Maren Reamer, MD  lisinopril (PRINIVIL,ZESTRIL) 10 MG tablet Take 1 tablet (10 mg total) by mouth 2 (two) times daily. 09/30/15   Imogene Burn, PA-C  metFORMIN (GLUCOPHAGE XR) 500 MG 24 hr tablet Take 2 tablets (1,000 mg total) by mouth 2 (two) times daily with a meal. 12/16/15   Maren Reamer, MD  metoprolol (LOPRESSOR) 50 MG tablet Take 50 mg by mouth 2 (two) times daily.    Historical Provider, MD  nicotine (NICOTROL) 10 MG inhaler Inhale 1 cartridge (1 continuous puffing total) into the lungs as needed for smoking cessation. 09/30/15   Imogene Burn, PA-C  nitroGLYCERIN (NITROSTAT) 0.4 MG SL tablet Place 1 tablet (0.4 mg total) under the tongue every 5 (five) minutes as needed for chest pain. 09/17/15   Ripudeep Krystal Eaton, MD    Family History Family History  Problem Relation Age of Onset  . Adopted: Yes  . Diabetes Maternal Grandmother     Social History Social History  Substance Use Topics  . Smoking status: Current Every Day Smoker    Packs/day: 1.00    Years: 33.00    Types: Cigarettes  . Smokeless tobacco: Never Used  . Alcohol use No     Allergies   Other and Levofloxacin   Review of Systems Review of Systems  Constitutional: Negative for chills and fever.  Musculoskeletal: Positive for arthralgias.  Skin: Negative for rash and wound.     Physical Exam Updated Vital Signs BP 133/79 (BP Location: Right Arm)   Pulse 96   Temp 98.1 F (36.7 C) (Oral)   Resp 20   Ht '5\' 5"'  (1.651 m)   Wt 245 lb (111.1 kg)   SpO2 97%   BMI 40.77 kg/m   Physical Exam Physical Exam  Constitutional: Pt appears well-developed and well-nourished. No  distress.  HENT:  Head: Normocephalic and atraumatic.  Eyes: Conjunctivae are normal.  Neck: Normal range of motion.  Cardiovascular: Normal rate, regular rhythm and intact distal pulses.   Capillary refill < 3 sec  Pulmonary/Chest: Effort normal and breath sounds normal.  Musculoskeletal: Pt exhibits NO tenderness to palpation, no bony abnormality or deformity. Pt exhibits no edema.  ROM: 5/5  Neurological: Pt  is alert. Coordination normal.  Sensation 5/5 Strength 5/5  Skin: Skin is warm and dry. Pt is not diaphoretic.  No tenting of the skin  Psychiatric: Pt has a normal mood and affect.  Nursing note and vitals reviewed.  ED Treatments / Results  DIAGNOSTIC STUDIES: Oxygen Saturation is 97% on  RA, normal by my interpretation.    COORDINATION OF CARE:  8:55 PM - Pt's parents advised of plan for treatment and pt's parents agree.    Labs (all labs ordered are listed, but only abnormal results are displayed) Labs Reviewed - No data to display  EKG  EKG Interpretation None       Radiology No results found.  Procedures Procedures (including critical care time)  Medications Ordered in ED Medications - No data to display   Initial Impression / Assessment and Plan / ED Course  Montine Circle, PA-C has reviewed the triage vital signs and the nursing notes.  Pertinent labs & imaging results that were available during my care of the patient were reviewed by me and considered in my medical decision making (see chart for details).  Clinical Course    Patient with left hip and groin pain.  No falls.  No new injuries. Chronic changes on plain films.    Final Clinical Impressions(s) / ED Diagnoses   Final diagnoses:  Left hip pain   I personally performed the services described in this documentation, which was scribed in my presence. The recorded information has been reviewed and is accurate.    New Prescriptions New Prescriptions   No medications on file       Montine Circle, PA-C 02/05/16 2154    Charlesetta Shanks, MD 02/05/16 2329

## 2016-02-18 MED FILL — !LANTUS 100 UNITS/ML VIAL: 100 | 30 days supply | Qty: 10 | Fill #1

## 2016-02-18 MED FILL — !NOVOLOG 100UNITS/ML VIAL: 100/ML | 22 days supply | Qty: 10 | Fill #1

## 2016-02-24 ENCOUNTER — Other Ambulatory Visit: Payer: Self-pay

## 2016-02-24 MED ORDER — INSULIN ASPART 100 UNIT/ML ~~LOC~~ SOLN: 15.0000 [IU] | Freq: Three times a day (TID) | SUBCUTANEOUS | 3 refills | Status: DC

## 2016-02-24 NOTE — Telephone Encounter (Signed)
Printed script for Novolog for pass 

## 2016-02-26 MED FILL — ?LISINOPRIL 10 MG TABLET: 10 | 30 days supply | Qty: 60 | Fill #2

## 2016-02-26 MED FILL — CLOPIDOGREL 75 MG TABLET: 75 | 30 days supply | Qty: 30 | Fill #3

## 2016-02-26 MED FILL — METFORMIN HCL ER 500 MG TAB: 500 | 30 days supply | Qty: 120 | Fill #2

## 2016-02-26 MED FILL — ?METOPROLOL 50 MG TABLET: 50 | 30 days supply | Qty: 60 | Fill #2

## 2016-02-26 MED FILL — ATORVASTATIN 80 MG TABLET: 80 | 30 days supply | Qty: 30 | Fill #2

## 2016-02-26 MED FILL — GABAPENTIN 600 MG TABLET: 600 | 30 days supply | Qty: 90 | Fill #2

## 2016-02-26 MED FILL — HYDROCHLOROTHIAZIDE 25 MG T: 25 | 30 days supply | Qty: 30 | Fill #2

## 2016-03-03 MED FILL — $LANTUS 100 UNITS/ML VIAL: 100 | 33 days supply | Qty: 20 | Fill #0

## 2016-03-11 MED FILL — !NOVOLOG 100UNITS/ML VIAL: 100/ML | 22 days supply | Qty: 10 | Fill #2

## 2016-03-17 ENCOUNTER — Other Ambulatory Visit: Payer: Self-pay | Admitting: Pharmacist

## 2016-03-17 MED ORDER — GLUCOSE BLOOD VI STRP: ORAL_STRIP | 12 refills | Status: DC

## 2016-03-17 MED ORDER — TRUEPLUS LANCETS 28G MISC: 12 refills | Status: DC

## 2016-03-18 MED FILL — TRUEplus LANCETS 28G MISC: 30 days supply | Qty: 100 | Fill #0

## 2016-03-18 MED FILL — TRUE METRIX TEST STRIP: 30 days supply | Qty: 100 | Fill #0

## 2016-03-23 ENCOUNTER — Encounter: Payer: Self-pay | Admitting: Internal Medicine

## 2016-03-23 ENCOUNTER — Telehealth: Payer: Self-pay | Admitting: Physician Assistant

## 2016-03-23 ENCOUNTER — Other Ambulatory Visit: Payer: Self-pay | Admitting: *Deleted

## 2016-03-23 ENCOUNTER — Encounter: Payer: Self-pay | Admitting: Dietician

## 2016-03-23 ENCOUNTER — Ambulatory Visit: Payer: Self-pay | Attending: Internal Medicine | Admitting: Internal Medicine

## 2016-03-23 ENCOUNTER — Encounter: Payer: Self-pay | Attending: Internal Medicine | Admitting: Dietician

## 2016-03-23 VITALS — BP 127/87 | HR 93 | Temp 98.8°F | Resp 16 | Ht 65.0 in | Wt 270.0 lb

## 2016-03-23 DIAGNOSIS — Z87891 Personal history of nicotine dependence: Secondary | ICD-10-CM | POA: Insufficient documentation

## 2016-03-23 DIAGNOSIS — E785 Hyperlipidemia, unspecified: Secondary | ICD-10-CM

## 2016-03-23 DIAGNOSIS — Z881 Allergy status to other antibiotic agents status: Secondary | ICD-10-CM | POA: Insufficient documentation

## 2016-03-23 DIAGNOSIS — E1140 Type 2 diabetes mellitus with diabetic neuropathy, unspecified: Secondary | ICD-10-CM | POA: Insufficient documentation

## 2016-03-23 DIAGNOSIS — E1142 Type 2 diabetes mellitus with diabetic polyneuropathy: Secondary | ICD-10-CM

## 2016-03-23 DIAGNOSIS — Z6841 Body Mass Index (BMI) 40.0 and over, adult: Secondary | ICD-10-CM | POA: Insufficient documentation

## 2016-03-23 DIAGNOSIS — I252 Old myocardial infarction: Secondary | ICD-10-CM | POA: Insufficient documentation

## 2016-03-23 DIAGNOSIS — Z713 Dietary counseling and surveillance: Secondary | ICD-10-CM | POA: Insufficient documentation

## 2016-03-23 DIAGNOSIS — Z794 Long term (current) use of insulin: Secondary | ICD-10-CM | POA: Insufficient documentation

## 2016-03-23 DIAGNOSIS — I2510 Atherosclerotic heart disease of native coronary artery without angina pectoris: Secondary | ICD-10-CM

## 2016-03-23 DIAGNOSIS — E119 Type 2 diabetes mellitus without complications: Secondary | ICD-10-CM | POA: Insufficient documentation

## 2016-03-23 DIAGNOSIS — Z7901 Long term (current) use of anticoagulants: Secondary | ICD-10-CM | POA: Insufficient documentation

## 2016-03-23 DIAGNOSIS — Z7982 Long term (current) use of aspirin: Secondary | ICD-10-CM | POA: Insufficient documentation

## 2016-03-23 DIAGNOSIS — I10 Essential (primary) hypertension: Secondary | ICD-10-CM

## 2016-03-23 DIAGNOSIS — Z23 Encounter for immunization: Secondary | ICD-10-CM

## 2016-03-23 DIAGNOSIS — Z720 Tobacco use: Secondary | ICD-10-CM

## 2016-03-23 LAB — HEPATIC FUNCTION PANEL
ALBUMIN: 3.8 g/dL (ref 3.6–5.1)
ALK PHOS: 60 U/L (ref 40–115)
ALT: 11 U/L (ref 9–46)
AST: 11 U/L (ref 10–40)
BILIRUBIN INDIRECT: 0.3 mg/dL (ref 0.2–1.2)
Bilirubin, Direct: 0.1 mg/dL (ref <=0.2)
Total Bilirubin: 0.4 mg/dL (ref 0.2–1.2)
Total Protein: 6.5 g/dL (ref 6.1–8.1)

## 2016-03-23 LAB — POCT GLYCOSYLATED HEMOGLOBIN (HGB A1C): Hemoglobin A1C: 8.1

## 2016-03-23 LAB — GLUCOSE, POCT (MANUAL RESULT ENTRY): POC Glucose: 157 mg/dl — AB (ref 70–99)

## 2016-03-23 LAB — LIPID PANEL
CHOLESTEROL: 147 mg/dL (ref 125–200)
HDL: 41 mg/dL (ref >=40)
LDL Cholesterol: 86 mg/dL (ref <130)
TRIGLYCERIDES: 102 mg/dL (ref <150)
Total CHOL/HDL Ratio: 3.6 Ratio (ref <=5.0)
VLDL: 20 mg/dL (ref <30)

## 2016-03-23 MED ORDER — INSULIN ASPART 100 UNIT/ML ~~LOC~~ SOLN: 15.0000 [IU] | Freq: Three times a day (TID) | SUBCUTANEOUS | 3 refills | Status: DC

## 2016-03-23 MED ORDER — GABAPENTIN 300 MG PO CAPS: 900.0000 mg | ORAL_CAPSULE | Freq: Three times a day (TID) | ORAL | 3 refills | Status: DC

## 2016-03-23 MED ORDER — INSULIN GLARGINE 100 UNIT/ML ~~LOC~~ SOLN: 30.0000 [IU] | Freq: Two times a day (BID) | SUBCUTANEOUS | 3 refills | Status: DC

## 2016-03-23 MED FILL — GABAPENTIN 300 MG CAPSULE: 300 | 20 days supply | Qty: 180 | Fill #0

## 2016-03-23 MED FILL — $LANTUS 100 UNITS/ML VIAL: 100 | 15 days supply | Qty: 10 | Fill #0

## 2016-03-23 NOTE — Progress Notes (Signed)
Cardiology Office Note:    Date:  03/24/2016   ID:  [name], DOB [birth year], MRN [id number]  PCP:  Maren Reamer, MD  Cardiologist:  Dr. Dorris Carnes   Electrophysiologist:  n/a  Referring MD: Maren Reamer, MD   Chief Complaint  Patient presents with  . Chest Pain    History of Present Illness:    Marc Walker is a 45 y.o. male with a hx of CAD s/p prior myocardial infarction, s/p multiple prior stents placed at outside institutions, DM, HTN, HL, tobacco abuse. He was admitted to [facility] in 3/17 with unstable angina. LHC demonstrated 80% mid RCA stenosis treated with DES, 90% proximal LAD and 70% mid LAD stenosis involving the previous stent treated with DES, 20% RPDA in-stent restenosis treated with angioplasty. The stent in OM2 was patent. Residual disease included 90% D2 stenosis and 70% mid LCx stenosis, treated medically.  Last seen 6/17.    He was seen by his PCP recently and noted to have chest pain.  He was added on today for evaluation.  He notes residual exertional chest pain and dyspnea on exertion since his last PCI.  This is with more extreme activities and he denies any change in his symptoms.  He also notes substernal tightness that only occurs while shaving and is assoc with shortness of breath.  He feels it is becoming worse.  He can lower his arms with relief.  He has not used NTG. This is not quite like his prior angina prior to his recent PCI earlier this year.  He continues to smoke.  He is not that active.  He denies orthopnea, PND.  He has some mild pedal edema.  He denies syncope.    Prior CV studies that were reviewed today include:    LHC 09/16/15 LAD ostial stent ok, prox 90%, mid 70% ISR, D2 90% LCx mid 70%, OM2 stent ok RCA mid 80%, dist stent ok, RPDA ostial stent ok with 20% ISR,  PCI: 2.5 x 20 mm Synergy DES to prox/mid LAD;  3.25 x 18 mm Xience Alpine DES to mid RCA; Angiosculpt POBA to RPDA  Past Medical History:  Diagnosis  Date  . Coronary artery disease   . Diabetes mellitus without complication ([place])   . Hyperlipidemia associated with type 2 diabetes mellitus ([place])   . Hypertension   . MI (myocardial infarction) ([place]) 07/2009   [facility] in [place], [place] for last 2, 1st one in [place], [place]; total of 6 stents, last one 02/2014     Past Surgical History:  Procedure Laterality Date  . CARDIAC CATHETERIZATION N/A 09/16/2015   Procedure: Left Heart Cath and Coronary Angiography;  Surgeon: Lorretta Harp, MD;  Location: [place] [facility];  Service: Cardiovascular;  Laterality: N/A;  . CARDIAC CATHETERIZATION N/A 09/16/2015   Procedure: Coronary Stent Intervention;  Surgeon: Lorretta Harp, MD;  Location: [place] [facility];  Service: Cardiovascular;  Laterality: N/A;  . CORONARY STENT PLACEMENT      Current Medications: Current Meds  Medication Sig  . amLODipine (NORVASC) 5 MG tablet Take 1 tablet (5 mg total) by mouth daily.  Marland Kitchen aspirin 81 MG tablet Take 81 mg by mouth daily.  Marland Kitchen atorvastatin (LIPITOR) 80 MG tablet Take 1 tablet (80 mg total) by mouth at bedtime.  . blood glucose meter kit and supplies KIT Dispense based on patient and insurance preference. Use up to four times daily as directed. (FOR ICD-9 250.00,  250.01).  . clopidogrel (PLAVIX) 75 MG tablet Take 1 tablet (75 mg total) by mouth daily.  . gabapentin (NEURONTIN) 300 MG capsule Take 3 capsules (900 mg total) by mouth 3 (three) times daily.  . gabapentin (NEURONTIN) 600 MG tablet Take 1 tablet (600 mg total) by mouth 3 (three) times daily.  . glucose blood (TRUE METRIX BLOOD GLUCOSE TEST) test strip Use as instructed  . hydrochlorothiazide (HYDRODIURIL) 25 MG tablet Take 1 tablet (25 mg total) by mouth daily.  . insulin aspart (NOVOLOG) 100 UNIT/ML injection Inject 15 Units into the skin 3 (three) times daily before meals. If prior meals sugars > 250 units, give 20units of novolog instead.  . insulin glargine (LANTUS) 100 UNIT/ML  injection Inject 0.3 mLs (30 Units total) into the skin 2 (two) times daily. If pm sugar > 300units, than give 35units [place] at night.  . Insulin Syringes, Disposable, U-100 0.5 ML MISC Take insulin as directed   Diagnosis: Insulin dependent diabetes mellitus ICD10 250  . isosorbide mononitrate (IMDUR) 60 MG 24 hr tablet Take 60 mg by mouth daily.  . lisinopril (PRINIVIL,ZESTRIL) 10 MG tablet Take 1 tablet (10 mg total) by mouth 2 (two) times daily.  . metFORMIN (GLUCOPHAGE XR) 500 MG 24 hr tablet Take 2 tablets (1,000 mg total) by mouth 2 (two) times daily with a meal.  . metoprolol (LOPRESSOR) 50 MG tablet Take 1.5 tablets (75 mg total) by mouth 2 (two) times daily.  . nicotine (NICOTROL) 10 MG inhaler Inhale 1 cartridge (1 continuous puffing total) into the lungs as needed for smoking cessation.  . nitroGLYCERIN (NITROSTAT) 0.4 MG SL tablet Place 1 tablet (0.4 mg total) under the tongue every 5 (five) minutes as needed for chest pain.  . TRUEPLUS LANCETS 28G MISC Use as directed  . [DISCONTINUED] metoprolol (LOPRESSOR) 50 MG tablet Take 50 mg by mouth 2 (two) times daily.       Allergies:   Other and Levofloxacin   Social History   Social History  . Marital status: Divorced    Spouse name: N/A  . Number of children: N/A  . Years of education: N/A   Occupational History  . Warehouse worker    Social History Main Topics  . Smoking status: Current Every Day Smoker    Packs/day: 1.00    Years: 33.00    Types: Cigarettes  . Smokeless tobacco: Never Used  . Alcohol use No  . Drug use: No  . Sexual activity: Not Asked   Other Topics Concern  . None   Social History Narrative   Adopted. Very little info on biological parents, ?diabetes.   Works in sales - [employer]     Family History:  The patient's family history includes Diabetes in his maternal grandmother. He was adopted.   ROS:   Please see the history of present illness.    Review of Systems  Constitution:  Positive for malaise/fatigue.  Cardiovascular: Positive for chest pain.  Respiratory: Positive for snoring.    All other systems reviewed and are negative.   EKGs/Labs/Other Test Reviewed:    EKG:  EKG is  ordered today.  The ekg ordered today demonstrates NSR, HR 75, normal axis, QTc 428 ms, no change since 12/16/15  Recent Labs: 09/17/2015: Hemoglobin 12.8; Platelets 177 12/17/2015: BUN 14; Creat 0.65; Potassium 4.7; Sodium 136 03/23/2016: ALT 11   Recent Lipid Panel    Component Value Date/Time   CHOL 147 03/23/2016 0744   TRIG 102 03/23/2016   0744   HDL 41 03/23/2016 0744   CHOLHDL 3.6 03/23/2016 0744   VLDL 20 03/23/2016 0744   LDLCALC 86 03/23/2016 0744     Physical Exam:    VS:  BP 138/70   Pulse 75   Ht 5' 5" (1.651 m)   Wt 269 lb (122 kg)   BMI 44.76 kg/m     Wt Readings from Last 3 Encounters:  03/24/16 269 lb (122 kg)  03/23/16 270 lb (122.5 kg)  03/23/16 266 lb (120.7 kg)     Physical Exam  Constitutional: He is oriented to person, place, and time. He appears well-developed and well-nourished. No distress.  HENT:  Head: Normocephalic and atraumatic.  Eyes: No scleral icterus.  Neck: No JVD present.  Cardiovascular: Normal rate, regular rhythm and normal heart sounds.   No murmur heard. Pulses:      Carotid pulses are on the right side with bruit, and on the left side with bruit. No subclavian bruits bilaterally   Pulmonary/Chest: Effort normal. He has decreased breath sounds. He has no wheezes. He has no rales.  Abdominal: Soft. There is no tenderness.  Musculoskeletal: He exhibits no edema.  Neurological: He is alert and oriented to person, place, and time.  Skin: Skin is warm and dry.  Psychiatric: He has a normal mood and affect.    ASSESSMENT:    1. Precordial pain   2. Coronary artery disease involving native coronary artery of native heart with angina pectoris ([place])   3. Essential hypertension   4. Hyperlipidemia   5. Tobacco abuse     PLAN:    In order of problems listed above:  1. Chest pain - He presents with atypical chest pain that only occurs while shaving.  It improves with lowering his arms.  He tells me that he has had this for years and it may have gotten better after his last PCI.  It seems to be getting worse over the past 2 weeks.  He does have chronic stable angina with more extreme activities and assoc dyspnea.  I'm sure his shortness of breath is multifactorial and related to his ongoing tobacco abuse.  He did have residual 90% D2 and 70% mLCx stenosis at his LHC in 3/17.  I will adjust his antianginals and arrange an ETT- Myoview.  -  Increase Metoprolol Tartrate to 75 mg bid  -  Arrange ETT-Myoview on med Rx - change to Lexiscan if he cannot achieve target HR  -  FU in next 2-3 weeks.  2. CAD - He is status post multiple prior PCI procedures in the past. Most recent was in 3/17 with a DES to the proximal/mid LAD and DES to the mid RCA.  He seems to describe some atypical chest pain as noted as well as some exertional symptoms c/w CCS Class 2 angina.  Increase beta-blocker dose as noted.  Continue current dose of Amlodipine, nitrates, ASA, Plavix, statin. Proceed with myoview.  He will need cath if he has ischemia in the LCx territory.    3. HTN -  BP is controlled.  Continue current Rx.  4. Hyperlipidemia - Recent LDL in the 80s.  I have rec increasing Lipitor to 80.  He may already be taking this dose.  He will check.  If he is taking Lipitor 80, I will change him to Crestor 40 and check Lipids again in 3 mos.   5. Tobacco abuse - We again discussed the importance of quitting.   Medication  Adjustments/Labs and Tests Ordered: Current medicines are reviewed at length with the patient today.  Concerns regarding medicines are outlined above.  Medication changes, Labs and Tests ordered today are outlined in the Patient Instructions noted below. Patient Instructions  Medication Instructions:  1. INCREASE  METOPROLOL TO 75 MG TWICE DAILY; (THIS WILL BE 1 AND 1/2 TABLETS TWICE DAILY) 2. CHECK YOUR BOTTLE OF LIPITOR WHEN YOU GET HOME; CALL [phone] AND ASK FOR CAROL TO LET ME KNOW THE DOSE. LOOKS LIKE ON 12/19/15 WE INCREASED YOU TO LIPITOR 80 MG  Labwork: NONE  Testing/Procedures: Your physician has requested that you have en exercise stress myoview. For further information please visit [email or web address]. Please follow instruction sheet, as given. OK TO CHANGE TO LEXISCAN PER SCOTT W. PAC IF NOT ABLE TO GET [facility] ; PER PA PT IS TO TAKE HIS METOPROLOL THE MORNING OF TEST  Follow-Up: DR. ROSS OR SCOTT WEAVER, PAC SAME DAY DR. ROSS IS IN THE OFFICE IN 2-3 WEEKS  Any Other Special Instructions Will Be Listed Below (If Applicable).  If you need a refill on your cardiac medications before your next appointment, please call your pharmacy.  Signed, Scott Weaver, PA-C  03/24/2016 1:04 PM    [facility] [address], [place], [place]  [address] Phone: [phone]; Fax: [phone]    

## 2016-03-23 NOTE — Telephone Encounter (Signed)
-----   Message from Pete Glatterawn T Langeland, MD sent at 03/23/2016  4:04 PM EDT ----- Regarding: chest pain, atypical Saw pt today, c/o of atypical chest pain when he shaves, none currently. Could you see if can fit him into your clinic sooner for eval. He had pci/cath 3/17. bp nml. Thanks.

## 2016-03-23 NOTE — Telephone Encounter (Signed)
Pt aware of lab results and to increase liptor to 80 mg daily and FLP/LFT 3 months; [facility] I also scheduled pt per Bing NeighborsScott W. PA conversation with PCP pt to be seen in our office. Pt c/o chest pain when shaving. Pt states to me that PCP said he needs a stress test. Pt scheduled to see Bing NeighborsScott W. PA 9/26 @ 11:45, pt aware to arrive 15 minutes early for registration.

## 2016-03-23 NOTE — Patient Instructions (Addendum)
Ask your doctor about:  Sex  Eye exam that is inexpensive (to check for retinopathy)  Numbness in your feet  Consider getting your vitamin B-12 checked Be as active as possible.  Aim for 30 minutes most days.  Find something you enjoy.  Continue to inquire about [facility] scholarship [facility]. Check your feet daily. Recommend stop smoking Rethink what you drink.  Avoid sugar sweetened tea.  Consider taking Crystal lite packets with you. Drink more water between meals.  Avoid skipping meals. Eat slowly.  Stop when you are satisfied or full. Eat more vegetables.

## 2016-03-23 NOTE — Progress Notes (Signed)
Pt is in the office today for a follow up on diabetes Pt states he is not in any pain today Pt states he is taking medication without difficulty

## 2016-03-23 NOTE — Telephone Encounter (Signed)
See message from Pete Glatterawn T Langeland, MD  Please call patient and get him an appointment to be seen. Thanks, Tereso NewcomerScott Kathline [place]bury, PA-C   03/23/2016 5:39 PM

## 2016-03-23 NOTE — Patient Instructions (Addendum)
[facility] - dm check 2-3 wks.  - QUICK START PATIENT GUIDE TO LCHF/IF LOW CARB HIGH FAT / INTERMITTENT FASTING What is this diet and how does it work? o Insulin is a hormone made by your body that allows you to use sugar (glucose) from carbohydrates in the food you eat for energy or to store glucose (as fat) for future use  o Insulin levels need to be lowered in order to utilize our stored energy (fat) o Many struggling with obesity are insulin resistant and have high levels of insulin o This diet works to lower your insulin in two ways o Fasting - allows your insulin levels to naturally decrease  o Avoiding carbohydrates - carbs trigger increase in insulin Low Carb Healthy Fat (LCHF) o Get a free app for your phone, such as MyFitnessPal, to help you track your macronutrients (carbs/protein/fats) and to track your weight and body measurements to see your progress o Set your goal for around 10% carbs/20% protein/70% fat o A good starting goal for amount of net carbs per day is 50 grams (some will aim for 20 grams) o "Net carbs" refers to total grams of carbs minus grams of fiber (as fiber is not typically absorbed). For example, if a food has 5g total carb and 3g fiber, that would be 2g net carbs o Increase healthy fats - eg. olive oil, eggs, nuts, avocado, cheese, butter, coconut, meats, fish o Avoid high carb foods - eg. bread, pasta, potatoes, rice, cookies, soda, juice, anything sugary o Buy full-fat ingredients (avoid low-fat versions, which often have more sugar) o No need to count calories, but pay close attention to grams of carbs on labels Intermittent Fasting (IF) o "Fasting" is going a period of time without eating - it helps to stay busy and well-hydrated o Purpose of fasting is to allow insulin levels to drop as low as possible, allowing your body to switch into fat-burning mode o With this diet there are many approaches to fasting, but 16:8 and 24hr fasts are  commonly used o 16:8 fast, usually 5-7 days a week - Fasting for 16 hours of the day, then eating all meals for the day over course of 8 hours. o 24 hour fast, usually 1-3 days a week - Typically eating one meal a day, then fasting until the next day. Plenty of fluids (and some salt to help you hold onto fluids) are recommended during longer fasts.  o During fasts certain beverages are still acceptable - water, sparkling water, bone broth, black tea or coffee, or tea/coffee with small amount of heavy whipping cream Special note for those on diabetic medications o Discuss your medications with your physician. You may need to hold your medication or adjust to only taking when eating. Diabetics should keep close track of their blood sugars when making any changes to diet/meds, to ensure they are staying within normal limits For more info about LCHF/IF o Watch "Therapeutic Fasting - Solving the Two-Compartment Problem" video by Dr. Wylene Simmer on YouTube ([email or web address]) for a great intro to these concepts o Read "The Obesity Code" and/or "The Complete Guide to Fasting" by Dr. Wylene Simmer o Go to www.dietdoctor.com for explanations, recipes, and infographics about foods to eat/avoid EXAMPLES TO GET STARTED Fasting Beverages -water (can add  tsp Pink Himalayan salt once or twice a day to help stay hydrated for longer fasts) -Sparkling water (such as [employer] or similar; avoid any with artificial sweeteners)  -Bone broth (multiple  recipes available online or can buy pre-made) -Tea or Coffee (Adding heavy whipping cream or coconut oil to your tea or coffee can be helpful if you find yourself getting too hungry during the fasts. Can also add cinnamon for flavor. Or "bulletproof coffee.") Low Carb Healthy Fat Breakfast (if not fasting) -eggs in butter or olive oil with avocado -omelet with veggies and cheese  Lunch -hamburger with cheese and avocado wrapped in lettuce (no  bun, no ketchup) -meat and cheese wrapped in lettuce (can dip in mustard or olive oil/vinegar/mayo) -salad with meat/cheese/nuts and higher fat dressing (vinaigrette or Ranch, etc) -tuna salad lettuce wrap -taco meat with cheese, sour cream, guacamole, cheese over lettuce  Dinner -steak with herb butter or Barnaise sauce -"Fathead" pizza (uses cheese and almond flour for the dough - several recipes available online) -roasted or grilled chicken with skin on, with low carb sauce (buffalo, garlic butter, alfredo, pesto, etc) -baked salmon with lemon butter -chicken alfredo with zucchini noodles -[place] butter chicken with low carb garlic naan -egg roll in a bowl  Side Dishes -mashed cauliflower (homemade or available in freezer section) -roast vegetables (green veggies that grow above ground rather than root veggies) with butter or cheese -Caprese salad (fresh mozzarella, tomato and basil with olive oil) -homemade low-carb coleslaw Snacks/Desserts (try to avoid unnecessary snacking and sweets in general) -celery or cucumber dipped in guacamole or sour cream dip -cheese and meat slices  -raspberries with whipped cream (can make homemade with no sugar added) -low carb [place] butter cake  AVOID - sugar, diet/regular soda, potatoes, breads, rice, pasta, candy, cookies, cakes, muffins, juice, high carb fruit (bananas, grapes), beer, ketchup, barbeque and other sweet sauces  --  Aim for 30 minutes of exercise most days. Rethink what you drink. Water is great! Aim for 2-3 Carb Choices per meal (30-45 grams) +/- 1 either way.  Aim for 0-15 Carbs per snack if hungry.  Include protein in moderation with your meals and snacks.  Consider reading food labels for Total Carbohydrate and Fat Grams of foods  Consider checking blood glucose (accuchecks/BG) at alternate times per day.  Continue taking medication as directed. Be mindful about how much sugar you are adding to beverages and other  foods. Fruit Punch - find one with no sugar  Measure and decrease portions of carbohydrate foods. Make your plate and don't go back for seconds.   Diabetes Mellitus and Food It is important for you to manage your blood sugar (glucose) level. Your blood glucose level can be greatly affected by what you eat. Eating healthier foods in the appropriate amounts throughout the day at about the same time each day will help you control your blood glucose level. It can also help slow or prevent worsening of your diabetes mellitus. Healthy eating may even help you improve the level of your blood pressure and reach or maintain a healthy weight.  General recommendations for healthful eating and cooking habits include:  Eating meals and snacks regularly. Avoid going long periods of time without eating to lose weight.  Eating a diet that consists mainly of plant-based foods, such as fruits, vegetables, nuts, legumes, and whole grains.  Using low-heat cooking methods, such as baking, instead of high-heat cooking methods, such as deep frying. Work with your dietitian to make sure you understand how to use the Nutrition Facts information on food labels. HOW CAN FOOD AFFECT ME? Carbohydrates Carbohydrates affect your blood glucose level more than any other type of food. Your dietitian  will help you determine how many carbohydrates to eat at each meal and teach you how to count carbohydrates. Counting carbohydrates is important to keep your blood glucose at a healthy level, especially if you are using insulin or taking certain medicines for diabetes mellitus. Alcohol Alcohol can cause sudden decreases in blood glucose (hypoglycemia), especially if you use insulin or take certain medicines for diabetes mellitus. Hypoglycemia can be a life-threatening condition. Symptoms of hypoglycemia (sleepiness, dizziness, and disorientation) are similar to symptoms of having too much alcohol.  If your health care provider has  given you approval to drink alcohol, do so in moderation and use the following guidelines:  Women should not have more than one drink per day, and men should not have more than two drinks per day. One drink is equal to:  12 oz of beer.  5 oz of wine.  1 oz of hard liquor.  Do not drink on an empty stomach.  Keep yourself hydrated. Have water, diet soda, or unsweetened iced tea.  Regular soda, juice, and other mixers might contain a lot of carbohydrates and should be counted. WHAT FOODS ARE NOT RECOMMENDED? As you make food choices, it is important to remember that all foods are not the same. Some foods have fewer nutrients per serving than other foods, even though they might have the same number of calories or carbohydrates. It is difficult to get your body what it needs when you eat foods with fewer nutrients. Examples of foods that you should avoid that are high in calories and carbohydrates but low in nutrients include:  Trans fats (most processed foods list trans fats on the Nutrition Facts label).  Regular soda.  Juice.  Candy.  Sweets, such as cake, pie, doughnuts, and cookies.  Fried foods. WHAT FOODS CAN I EAT? Eat nutrient-rich foods, which will nourish your body and keep you healthy. The food you should eat also will depend on several factors, including:  The calories you need.  The medicines you take.  Your weight.  Your blood glucose level.  Your blood pressure level.  Your cholesterol level. You should eat a variety of foods, including:  Protein.  Lean cuts of meat.  Proteins low in saturated fats, such as fish, egg whites, and beans. Avoid processed meats.  Fruits and vegetables.  Fruits and vegetables that may help control blood glucose levels, such as apples, mangoes, and yams.  Dairy products.  Choose fat-free or low-fat dairy products, such as milk, yogurt, and cheese.  Grains, bread, pasta, and rice.  Choose whole grain products, such  as multigrain bread, whole oats, and brown rice. These foods may help control blood pressure.  Fats.  Foods containing healthful fats, such as nuts, avocado, olive oil, canola oil, and fish. DOES EVERYONE WITH DIABETES MELLITUS HAVE THE SAME MEAL PLAN? Because every person with diabetes mellitus is different, there is not one meal plan that works for everyone. It is very important that you meet with a dietitian who will help you create a meal plan that is just right for you.   This information is not intended to replace advice given to you by your health care provider. Make sure you discuss any questions you have with your health care provider.   Document Released: 03/12/2005 Document Revised: 07/06/2014 Document Reviewed: 05/12/2013 Elsevier Interactive Patient Education 2016 [employer].  - Tips for Eating Away From Home If You Have Diabetes Controlling your level of blood glucose, also known as blood sugar, can be  challenging. It can be even more difficult when you do not prepare your own meals. The following tips can help you manage your diabetes when you eat away from home. PLANNING AHEAD Plan ahead if you know you will be eating away from home:  Ask your health care provider how to time meals and medicine if you are taking insulin.  Make a list of restaurants near you that offer healthy choices. If they have a carry-out menu, take it home and plan what you will order ahead of time.  Look up the restaurant you want to eat at online. Many chain and fast-food restaurants list nutritional information online. Use this information to choose the healthiest options and to calculate how many carbohydrates will be in your meal.  Use a carbohydrate-counting book or mobile app to look up the carbohydrate content and serving size of the foods you want to eat.  Become familiar with serving sizes and learn to recognize how many servings are in a portion. This will allow you to estimate how many  carbohydrates you can eat. FREE FOODS A "free food" is any food or drink that has less than 5 g of carbohydrates per serving. Free foods include:  Many vegetables.  Hard boiled eggs.  Nuts or seeds.  Olives.  Cheeses.  Meats. These types of foods make good appetizer choices and are often available at salad bars. Lemon juice, vinegar, or a low-calorie salad dressing of fewer than 20 calories per serving can be used as a "free" salad dressing.  CHOICES TO REDUCE CARBOHYDRATES  Substitute nonfat sweetened yogurt with a sugar-free yogurt. Yogurt made from soy milk may also be used, but you will still want a sugar-free or plain option to choose a lower carbohydrate amount.  Ask your server to take away the bread basket or chips from your table.  Order fresh fruit. A salad bar often offers fresh fruit choices. Avoid canned fruit because it is usually packed in sugar or syrup.  Order a salad, and eat it without dressing. Or, create a "free" salad dressing.  Ask for substitutions. For example, instead of [place] fries, request an order of a vegetable such as salad, green beans, or broccoli. OTHER TIPS   If you take insulin, take the insulin once your food arrives to your table. This will ensure your insulin and food are timed correctly.  Ask your server about the portion size before your order, and ask for a take-out box if the portion has more servings than you should have. When your food comes, leave the amount you should have on the plate, and put the rest in the take-out box.  Consider splitting an entree with someone and ordering a side salad.   This information is not intended to replace advice given to you by your health care provider. Make sure you discuss any questions you have with your health care provider.   Document Released: 06/15/2005 Document Revised: 03/06/2015 Document Reviewed: 09/12/2013 Elsevier Interactive Patient Education 2016 [employer].  -  You Can Quit  Smoking If you are ready to quit smoking or are thinking about it, congratulations! You have chosen to help yourself be healthier and live longer! There are lots of different ways to quit smoking. Nicotine gum, nicotine patches, a nicotine inhaler, or nicotine nasal spray can help with physical craving. Hypnosis, support groups, and medicines help break the habit of smoking. TIPS TO GET OFF AND STAY OFF CIGARETTES  Learn to predict your moods. Do not let  a bad situation be your excuse to have a cigarette. Some situations in your life might tempt you to have a cigarette.  Ask friends and co-workers not to smoke around you.  Make your home smoke-free.  Never have "just one" cigarette. It leads to wanting another and another. Remind yourself of your decision to quit.  On a card, make a list of your reasons for not smoking. Read it at least the same number of times a day as you have a cigarette. Tell yourself everyday, "I do not want to smoke. I choose not to smoke."  Ask someone at home or work to help you with your plan to quit smoking.  Have something planned after you eat or have a cup of coffee. Take a walk or get other exercise to perk you up. This will help to keep you from overeating.  Try a relaxation exercise to calm you down and decrease your stress. Remember, you may be tense and nervous the first two weeks after you quit. This will pass.  Find new activities to keep your hands busy. Play with a pen, coin, or rubber band. Doodle or draw things on paper.  Brush your teeth right after eating. This will help cut down the craving for the taste of tobacco after meals. You can try mouthwash too.  Try gum, breath mints, or diet candy to keep something in your mouth. IF YOU SMOKE AND WANT TO QUIT:  Do not stock up on cigarettes. Never buy a carton. Wait until one pack is finished before you buy another.  Never carry cigarettes with you at work or at home.  Keep cigarettes as far away  from you as possible. Leave them with someone else.  Never carry matches or a lighter with you.  Ask yourself, "Do I need this cigarette or is this just a reflex?"  Bet with someone that you can quit. Put cigarette money in a piggy bank every morning. If you smoke, you give up the money. If you do not smoke, by the end of the week, you keep the money.  Keep trying. It takes 21 days to change a habit!  Talk to your doctor about using medicines to help you quit. These include nicotine replacement gum, lozenges, or skin patches.   This information is not intended to replace advice given to you by your health care provider. Make sure you discuss any questions you have with your health care provider.   Document Released: 04/11/2009 Document Revised: 09/07/2011 Document Reviewed: 04/11/2009 Elsevier Interactive Patient Education 2016 [employer]. Tdap Vaccine (Tetanus, Diphtheria and Pertussis): What You Need to Know 1. Why get vaccinated? Tetanus, diphtheria and pertussis are very serious diseases. Tdap vaccine can protect [place] from these diseases. And, Tdap vaccine given to pregnant women can protect newborn babies against pertussis. TETANUS (Lockjaw) is rare in the [place] today. It causes painful muscle tightening and stiffness, usually all over the body.  It can lead to tightening of muscles in the head and neck so you can't open your mouth, swallow, or sometimes even breathe. Tetanus kills about 1 out of 10 people who are infected even after receiving the best medical care. DIPHTHERIA is also rare in the [place] today. It can cause a thick coating to form in the back of the throat.  It can lead to breathing problems, heart failure, paralysis, and death. PERTUSSIS (Whooping Cough) causes severe coughing spells, which can cause difficulty breathing, vomiting and disturbed sleep.  It  can also lead to weight loss, incontinence, and rib fractures. Up to 2 in 100 adolescents and 5 in  100 adults with pertussis are hospitalized or have complications, which could include pneumonia or death. These diseases are caused by bacteria. Diphtheria and pertussis are spread from person to person through secretions from coughing or sneezing. Tetanus enters the body through cuts, scratches, or wounds. Before vaccines, as many as 200,000 cases of diphtheria, 200,000 cases of pertussis, and hundreds of cases of tetanus, were reported in the [place] each year. Since vaccination began, reports of cases for tetanus and diphtheria have dropped by about 99% and for pertussis by about 80%. 2. Tdap vaccine Tdap vaccine can protect adolescents and adults from tetanus, diphtheria, and pertussis. One dose of Tdap is routinely given at age 47 or 3112. People who did not get Tdap at that age should get it as soon as possible. Tdap is especially important for healthcare professionals and anyone having close contact with a baby younger than 12 months. Pregnant women should get a dose of Tdap during every pregnancy, to protect the newborn from pertussis. Infants are most at risk for severe, life-threatening complications from pertussis. Another vaccine, called Td, protects against tetanus and diphtheria, but not pertussis. A Td booster should be given every 10 years. Tdap may be given as one of these boosters if you have never gotten Tdap before. Tdap may also be given after a severe cut or burn to prevent tetanus infection. Your doctor or the person giving you the vaccine can give you more information. Tdap may safely be given at the same time as other vaccines. 3. Some people should not get this vaccine  A person who has ever had a life-threatening allergic reaction after a previous dose of any diphtheria, tetanus or pertussis containing vaccine, OR has a severe allergy to any part of this vaccine, should not get Tdap vaccine. Tell the person giving the vaccine about any severe allergies.  Anyone who had  coma or long repeated seizures within 7 days after a childhood dose of DTP or DTaP, or a previous dose of Tdap, should not get Tdap, unless a cause other than the vaccine was found. They can still get Td.  Talk to your doctor if you:  have seizures or another nervous system problem,  had severe pain or swelling after any vaccine containing diphtheria, tetanus or pertussis,  ever had a condition called Guillain-Barr Syndrome (GBS),  aren't feeling well on the day the shot is scheduled. 4. Risks With any medicine, including vaccines, there is a chance of side effects. These are usually mild and go away on their own. Serious reactions are also possible but are rare. Most people who get Tdap vaccine do not have any problems with it. Mild problems following Tdap (Did not interfere with activities)  Pain where the shot was given (about 3 in 4 adolescents or 2 in 3 adults)  Redness or swelling where the shot was given (about 1 person in 5)  Mild fever of at least 100.70F (up to about 1 in 25 adolescents or 1 in 100 adults)  Headache (about 3 or 4 people in 10)  Tiredness (about 1 person in 3 or 4)  Nausea, vomiting, diarrhea, stomach ache (up to 1 in 4 adolescents or 1 in 10 adults)  Chills, sore joints (about 1 person in 10)  Body aches (about 1 person in 3 or 4)  Rash, swollen glands (uncommon) Moderate problems following Tdap (Interfered  with activities, but did not require medical attention)  Pain where the shot was given (up to 1 in 5 or 6)  Redness or swelling where the shot was given (up to about 1 in 16 adolescents or 1 in 12 adults)  Fever over 102F (about 1 in 100 adolescents or 1 in 250 adults)  Headache (about 1 in 7 adolescents or 1 in 10 adults)  Nausea, vomiting, diarrhea, stomach ache (up to 1 or 3 people in 100)  Swelling of the entire arm where the shot was given (up to about 1 in 500). Severe problems following Tdap (Unable to perform usual activities;  required medical attention)  Swelling, severe pain, bleeding and redness in the arm where the shot was given (rare). Problems that could happen after any vaccine:  People sometimes faint after a medical procedure, including vaccination. Sitting or lying down for about 15 minutes can help prevent fainting, and injuries caused by a fall. Tell your doctor if you feel dizzy, or have vision changes or ringing in the ears.  Some people get severe pain in the shoulder and have difficulty moving the arm where a shot was given. This happens very rarely.  Any medication can cause a severe allergic reaction. Such reactions from a vaccine are very rare, estimated at fewer than 1 in a million doses, and would happen within a few minutes to a few hours after the vaccination. As with any medicine, there is a very remote chance of a vaccine causing a serious injury or death. The safety of vaccines is always being monitored. For more information, visit: http://floyd.org/ 5. What if there is a serious problem? What should I look for?  Look for anything that concerns you, such as signs of a severe allergic reaction, very high fever, or unusual behavior.  Signs of a severe allergic reaction can include hives, swelling of the face and throat, difficulty breathing, a fast heartbeat, dizziness, and weakness. These would usually start a few minutes to a few hours after the vaccination. What should I do?  If you think it is a severe allergic reaction or other emergency that can't wait, call 9-1-1 or get the person to the nearest hospital. Otherwise, call your doctor.  Afterward, the reaction should be reported to the Vaccine Adverse Event Reporting System (VAERS). Your doctor might file this report, or you can do it yourself through the VAERS web site at www.vaers.LAgents.no, or by calling 1-(551) 399-9776. VAERS does not give medical advice.  6. The National Vaccine Injury Compensation Program The [employer]  Vaccine Injury Compensation Program (VICP) is a federal program that was created to compensate people who may have been injured by certain vaccines. Persons who believe they may have been injured by a vaccine can learn about the program and about filing a claim by calling 1-(915) 178-2234 or visiting the VICP website at SpiritualWord.at. There is a time limit to file a claim for compensation. 7. How can I learn more?  Ask your doctor. He or she can give you the vaccine package insert or suggest other sources of information.  Call your local or state health department.  Contact the Centers for Disease Control and Prevention (CDC):  Call 912-119-0416 (1-800-CDC-INFO) or  Visit CDC's website at PicCapture.uy CDC Tdap Vaccine VIS (08/22/13)   This information is not intended to replace advice given to you by your health care provider. Make sure you discuss any questions you have with your health care provider.   Document Released: 12/15/2011 Document Revised:  07/06/2014 Document Reviewed: 09/27/2013 Elsevier Interactive Patient Education 2016 Elsevier Inc. Influenza Virus Vaccine injection (Fluarix) What is this medicine? INFLUENZA VIRUS VACCINE (in floo EN zuh VAHY ruhs vak SEEN) helps to reduce the risk of getting influenza also known as the flu. This medicine may be used for other purposes; ask your health care provider or pharmacist if you have questions. What should I tell my health care provider before I take this medicine? They need to know if you have any of these conditions: -bleeding disorder like hemophilia -fever or infection -Guillain-Barre syndrome or other neurological problems -immune system problems -infection with the human immunodeficiency virus (HIV) or AIDS -low blood platelet counts -multiple sclerosis -an unusual or allergic reaction to influenza virus vaccine, eggs, chicken proteins, latex, gentamicin, other medicines, foods, dyes or  preservatives -pregnant or trying to get pregnant -breast-feeding How should I use this medicine? This vaccine is for injection into a muscle. It is given by a health care professional. A copy of Vaccine Information Statements will be given before each vaccination. Read this sheet carefully each time. The sheet may change frequently. Talk to your pediatrician regarding the use of this medicine in children. Special care may be needed. Overdosage: If you think you have taken too much of this medicine contact a poison control center or emergency room at once. NOTE: This medicine is only for you. Do not share this medicine with others. What if I miss a dose? This does not apply. What may interact with this medicine? -chemotherapy or radiation therapy -medicines that lower your immune system like etanercept, anakinra, infliximab, and adalimumab -medicines that treat or prevent blood clots like warfarin -phenytoin -steroid medicines like prednisone or cortisone -theophylline -vaccines This list may not describe all possible interactions. Give your health care provider a list of all the medicines, herbs, non-prescription drugs, or dietary supplements you use. Also tell them if you smoke, drink alcohol, or use illegal drugs. Some items may interact with your medicine. What should I watch for while using this medicine? Report any side effects that do not go away within 3 days to your doctor or health care professional. Call your health care provider if any unusual symptoms occur within 6 weeks of receiving this vaccine. You may still catch the flu, but the illness is not usually as bad. You cannot get the flu from the vaccine. The vaccine will not protect against colds or other illnesses that may cause fever. The vaccine is needed every year. What side effects may I notice from receiving this medicine? Side effects that you should report to your doctor or health care professional as soon as  possible: -allergic reactions like skin rash, itching or hives, swelling of the face, lips, or tongue Side effects that usually do not require medical attention (report to your doctor or health care professional if they continue or are bothersome): -fever -headache -muscle aches and pains -pain, tenderness, redness, or swelling at site where injected -weak or tired This list may not describe all possible side effects. Call your doctor for medical advice about side effects. You may report side effects to FDA at 1-800-FDA-1088. Where should I keep my medicine? This vaccine is only given in a clinic, pharmacy, doctor's office, or other health care setting and will not be stored at home. NOTE: This sheet is a summary. It may not cover all possible information. If you have questions about this medicine, talk to your doctor, pharmacist, or health care provider.    2016,  Elsevier/Gold Standard. (2008-01-11 09:30:40)

## 2016-03-23 NOTE — Progress Notes (Signed)
Marc Walker, is a 45 y.o. male  GYF:[id number]  FFM:[id number]  DOB - 27-Jun-1971  Chief Complaint  Patient presents with  . Follow-up        Subjective:   Marc Walker is a 45 y.o. male here today for a follow up visit, last seen on 12/16/15 for dm, w/ significant pmhx of morbid obesity, OOC DM2, htn, cad, hld.  H/o admission [facility] in 3/17 with unstable angina. LHC demonstrated mid RCA stenosis treated with DES, mid LAD stenosis involving the previous stent treated with DES, 20% RPDA in-stent restenosis treated with angioplasty. The stent in OM2 was patent. Residual disease included 90% D2 stenosis and 70% mid LCx stenosis, treated medically.    He is still smoking, would not quantify for me how much he is smoking though.    C/o of atypical chest pain when ever he shaves w/ associated doe.  No pain currently.    Also c/o of difficulty ambulating long distances due to doe/deconditioning. Wants to join gym /ymca w/ pool to exercise more.  He has been taking lantus 30bid, and novolog 15 tid. Brought in his montor, most cbg ranging 150-260s, occassional cbg 80-100 (rare).  Patient has No headache, No chest pain currently, No abdominal pain - No Nausea, No new weakness tingling or numbness, No Cough - SOB.  No problems updated.  ALLERGIES: Allergies  Allergen Reactions  . 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  Pt only wants pain medicine if absolutely necessary.  . Levofloxacin Rash    Rash on back (not sun exposed)and hypersensitivity to skin on exposed skin/arm    PAST MEDICAL HISTORY: Past Medical History:  Diagnosis Date  . Coronary artery disease   . Diabetes mellitus without complication ([place])   . Hyperlipidemia associated with type 2 diabetes mellitus ([place])   . Hypertension   . MI (myocardial infarction) ([place]) 07/2009   [facility] in [place], [place] for last 2, 1st one in [place], [place]; total of 6 stents, last one  02/2014     MEDICATIONS AT HOME: Prior to Admission medications   Medication Sig Start Date End Date Taking? Authorizing Provider  amLODipine (NORVASC) 5 MG tablet Take 1 tablet (5 mg total) by mouth daily. 12/02/15  Yes Maren Reamer, MD  aspirin 81 MG tablet Take 81 mg by mouth daily.   Yes Historical Provider, MD  atorvastatin (LIPITOR) 80 MG tablet Take 1 tablet (80 mg total) by mouth at bedtime. 12/19/15  Yes Liliane Shi, PA-C  blood glucose meter kit and supplies KIT Dispense based on patient and insurance preference. Use up to four times daily as directed. (FOR ICD-9 250.00, 250.01). 09/17/15  Yes Ripudeep K Rai, MD  clopidogrel (PLAVIX) 75 MG tablet Take 1 tablet (75 mg total) by mouth daily. 12/02/15  Yes Fay Records, MD  gabapentin (NEURONTIN) 600 MG tablet Take 1 tablet (600 mg total) by mouth 3 (three) times daily. 12/16/15  Yes Maren Reamer, MD  glucose blood (TRUE METRIX BLOOD GLUCOSE TEST) test strip Use as instructed 03/17/16  Yes Maren Reamer, MD  hydrochlorothiazide (HYDRODIURIL) 25 MG tablet Take 1 tablet (25 mg total) by mouth daily. 12/16/15  Yes Rylea Selway Lazarus Gowda, MD  insulin aspart (NOVOLOG) 100 UNIT/ML injection Inject 15 Units into the skin 3 (three) times daily before meals. If prior meals sugars > 250 units, give 20units of novolog instead. 03/23/16  Yes  Maren Reamer, MD  insulin glargine (LANTUS) 100 UNIT/ML injection Inject 0.3 mLs (30 Units total) into the skin 2 (two) times daily. If pm sugar > 300units, than give 35units [place] at night. 03/23/16  Yes Maren Reamer, MD  Insulin Syringes, Disposable, U-100 0.5 ML MISC Take insulin as directed   Diagnosis: Insulin dependent diabetes mellitus ICD10 250 11/04/15  Yes Maren Reamer, MD  isosorbide mononitrate (IMDUR) 60 MG 24 hr tablet Take 60 mg by mouth daily.   Yes Historical Provider, MD  lisinopril (PRINIVIL,ZESTRIL) 10 MG tablet Take 1 tablet (10 mg total) by mouth 2 (two) times daily. 09/30/15  Yes Imogene Burn, PA-C  metFORMIN (GLUCOPHAGE XR) 500 MG 24 hr tablet Take 2 tablets (1,000 mg total) by mouth 2 (two) times daily with a meal. 12/16/15  Yes Maren Reamer, MD  metoprolol (LOPRESSOR) 50 MG tablet Take 50 mg by mouth 2 (two) times daily.   Yes Historical Provider, MD  nitroGLYCERIN (NITROSTAT) 0.4 MG SL tablet Place 1 tablet (0.4 mg total) under the tongue every 5 (five) minutes as needed for chest pain. 09/17/15  Yes Ripudeep Krystal Eaton, MD  TRUEPLUS LANCETS 28G MISC Use as directed 03/17/16  Yes Maren Reamer, MD  diclofenac sodium (VOLTAREN) 1 % GEL Apply 2 g topically 4 (four) times daily. Patient not taking: Reported on 03/23/2016 12/16/15   Maren Reamer, MD  gabapentin (NEURONTIN) 300 MG capsule Take 3 capsules (900 mg total) by mouth 3 (three) times daily. 03/23/16   Maren Reamer, MD  nicotine (NICOTROL) 10 MG inhaler Inhale 1 cartridge (1 continuous puffing total) into the lungs as needed for smoking cessation. 09/30/15   Imogene Burn, PA-C     Objective:   Vitals:   03/23/16 1414  BP: 127/87  Pulse: 93  Resp: 16  Temp: 98.8 F (37.1 C)  TempSrc: Oral  SpO2: 95%  Weight: 270 lb (122.5 kg)   Gained 20lbs in last 36month. bmi 45  Exam General appearance : Awake, alert, not in any distress. Speech Clear. Not toxic looking, morbid obese.  Pleasant. HEENT: Atraumatic and Normocephalic, pupils equally reactive to light. Neck: supple, no JVD. Chest:Good air entry bilaterally, no added sounds. CVS: S1 S2 regular, no murmurs/gallups or rubs. Abdomen: Bowel sounds active, abd obesity, Non tender,  with no gaurding, rigidity or rebound. Extremities: B/L Lower Ext shows no edema, both legs are warm to touch Neurology: Awake alert, and oriented X 3, CN II-XII grossly intact, Non focal Skin:No Rash  Data Review Lab Results  Component Value Date   HGBA1C 9.3 11/28/2015   HGBA1C 11.6 (H) 09/16/2015    Depression screen PHQ 2/9 03/23/2016 03/23/2016 11/28/2015  Decreased  Interest 0 0 0  Down, Depressed, Hopeless 0 0 0  PHQ - 2 Score 0 0 0  Altered sleeping - - 3  Tired, decreased energy - - 3  Change in appetite - - 3  Feeling bad or failure about yourself  - - 0  Trouble concentrating - - 0  Moving slowly or fidgety/restless - - 1  Suicidal thoughts - - 0  PHQ-9 Score - - 10  Difficult doing work/chores - - Somewhat difficult      Assessment & Plan   1. DM type 2 with diabetic peripheral neuropathy ([place] Saw dietition today as well, provided pt on LCHF/IF info, if can keep carbs<50grm he would notice considerable weight loss, and may ultimately need less insulin overall. -  Glucose (CBG) - HgB A1c 8.1 - letter written for financial assistance w/ gym for medical purposes. - [facility], f/u w/ me 1 month  2. Coronary artery disease involving native coronary artery of native heart without angina pectoris Hx of cath 3/17, now w/ atypical cp, suspect morbid obesity and deconditioning. Advised he makes f/u w/ his cardiologist for further eval. Continue asa/statin/plavix.  3. Tobacco abuse Still smoking, would not tell me how much, urged smoking cessation. Tips provided  4. Hyperlipidemia Continue statin  5. htn Well control, no changes.  6. Morbid obesity, unspecified obesity type ([place]) Needs to lose weight, has gained 20lbs in 21month Lchf/if diet info provided , lengthy discussion w/ pt on weight loss benefits.     Patient have been counseled extensively about nutrition and exercise  Return in about 4 weeks (around 04/20/2016) for dm.  The patient was given clear instructions to go to ER or return to medical center if symptoms don't improve, worsen or new problems develop. The patient verbalized understanding. The patient was told to call to get lab results if they haven't heard anything in the next week.   This note has been created with DSurveyor, quantity Any  transcriptional errors are unintentional.   DMaren Reamer MD, MClevesand WSeashore Surgical Institute[facility] [place]  03/23/2016, 3:40 PM

## 2016-03-24 ENCOUNTER — Telehealth: Payer: Self-pay | Admitting: *Deleted

## 2016-03-24 ENCOUNTER — Ambulatory Visit: Payer: Self-pay | Admitting: Internal Medicine

## 2016-03-24 ENCOUNTER — Ambulatory Visit (INDEPENDENT_AMBULATORY_CARE_PROVIDER_SITE_OTHER): Payer: Self-pay | Admitting: Physician Assistant

## 2016-03-24 ENCOUNTER — Encounter: Payer: Self-pay | Admitting: Dietician

## 2016-03-24 ENCOUNTER — Encounter: Payer: Self-pay | Admitting: Physician Assistant

## 2016-03-24 VITALS — BP 138/70 | HR 75 | Ht 65.0 in | Wt 269.0 lb

## 2016-03-24 DIAGNOSIS — R072 Precordial pain: Secondary | ICD-10-CM

## 2016-03-24 DIAGNOSIS — I25119 Atherosclerotic heart disease of native coronary artery with unspecified angina pectoris: Secondary | ICD-10-CM

## 2016-03-24 DIAGNOSIS — E785 Hyperlipidemia, unspecified: Secondary | ICD-10-CM

## 2016-03-24 DIAGNOSIS — I10 Essential (primary) hypertension: Secondary | ICD-10-CM

## 2016-03-24 DIAGNOSIS — Z720 Tobacco use: Secondary | ICD-10-CM

## 2016-03-24 MED ORDER — METOPROLOL TARTRATE 50 MG PO TABS: 75.0000 mg | ORAL_TABLET | Freq: Two times a day (BID) | ORAL | 11 refills | Status: DC

## 2016-03-24 MED ORDER — ROSUVASTATIN CALCIUM 40 MG PO TABS: 40.0000 mg | ORAL_TABLET | Freq: Every day | ORAL | 3 refills | Status: DC

## 2016-03-24 MED FILL — METOPROLOL TARTRATE 50 MG T: 50 | 30 days supply | Qty: 90 | Fill #0

## 2016-03-24 MED FILL — !NOVOLOG 100UNITS/ML VIAL: 100/ML | 28 days supply | Qty: 20 | Fill #0

## 2016-03-24 NOTE — Telephone Encounter (Signed)
Patient called to report that he was already taking atorvastatin 80 mg. He also has some questions in regards to the test that he is scheduled for. He can be reached at [phone]. Thanks, MI

## 2016-03-24 NOTE — Patient Instructions (Addendum)
Medication Instructions:  1. INCREASE METOPROLOL TO 75 MG TWICE DAILY; (THIS WILL BE 1 AND 1/2 TABLETS TWICE DAILY) 2. CHECK YOUR BOTTLE OF LIPITOR WHEN YOU GET HOME; CALL [phone] AND ASK FOR Tyjai Charbonnet TO LET ME KNOW THE DOSE. LOOKS LIKE ON 12/19/15 WE INCREASED YOU TO LIPITOR 80 MG  Labwork: NONE  Testing/Procedures: Your physician has requested that you have en exercise stress myoview. For further information please visit [email or web address]. Please follow instruction sheet, as given. OK TO CHANGE TO LEXISCAN PER SCOTT W. PAC IF NOT ABLE TO GET [facility] [facility] ; PER PA PT IS TO TAKE HIS METOPROLOL THE MORNING OF TEST  Follow-Up: DR. Tenny CrawOSS OR SCOTT WEAVER, PAC SAME DAY DR. Tenny CrawOSS IS IN THE OFFICE IN 2-3 WEEKS  Any Other Special Instructions Will Be Listed Below (If Applicable).  If you need a refill on your cardiac medications before your next appointment, please call your pharmacy.

## 2016-03-24 NOTE — Telephone Encounter (Signed)
S/w pt in reference to Loews CorporationScott W. PA changed pt from Lipitor 80 mg to Crestor 40 mg to help lower LDL. Fasting lipid and liver panel to be done 06/23/16. Pt agreeable to this plan of care. Pt also confirmed that his myoview we ordered today is going to be done in our [address] I confirmed this for the pt. Pt is agreeable to plan of care .

## 2016-03-24 NOTE — Progress Notes (Signed)
Diabetes Self-Management Education  Visit Type: First/Initial  Appt. Start Time: 0800 Appt. End Time: 0915  03/24/2016  [name], identified by name and date of birth, is a 45 y.o. male with a diagnosis of Diabetes: Type 2. He saw his internist yesterday and his A1C reduced from 9.2% to 8.1% in the last 3 months.  He continues to smoke and is very resistant to change.  After our visit today, he stated that he did not like what I had to say (did not like the changes that I recommended because he likes to eat a lot) and did not want a follow up at this time.  Hx includes drug abuse 25 years ago, HTN, Hyperlipidemia, MI's and stents. He complained of numbness/tingling in some extremities.  He is currently taking 30 units Lantus every am and HS and 15 units of Novolog 15 minutes prior to meals and often misses his lunch dose or other dose when he skips meals or eats out.  He also takes Metformin (XR).  He is tracking his blood sugar on a blood glucose Tracks app.  He currently lives with his parents.  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  He has gained from 250 lbs to 266 lbs in the past 2 months because he admittedly overate.  He eats some meals with his parents but often eats out or skips meals.  ASSESSMENT  Height 5' 6.5" (1.689 m), weight 266 lb (120.7 kg). Body mass index is 42.29 kg/m.      Diabetes Self-Management Education - 03/23/16 0902      Visit Information   Visit Type First/Initial     Initial Visit   Diabetes Type Type 2   Are you currently following a meal plan? No   Are you taking your medications as prescribed? No  always remembers Lantus, 30-40% of the time forgets Novolog,   Date Diagnosed 2007?     Health Coping   How would you rate your overall health? Poor     Psychosocial Assessment   Patient Belief/Attitude about Diabetes Other (comment)  "It sucks"   Self-care barriers None    Self-management support Doctor's office;Family;Church   Other persons present Patient   Patient Concerns Nutrition/Meal planning;Weight Control;Glycemic Control   Special Needs None   Preferred Learning Style Auditory   Learning Readiness Ready   How often do you need to have someone help you when you read instructions, pamphlets, or other written materials from your doctor or pharmacy? 1 - Never   What is the last grade level you completed in school? GED 10th grade     Pre-Education Assessment   Patient understands the diabetes disease and treatment process. Needs Review   Patient understands incorporating nutritional management into lifestyle. Needs Instruction   Patient undertands incorporating physical activity into lifestyle. Needs Review   Patient understands using medications safely. Demonstrates understanding / competency   Patient understands monitoring blood glucose, interpreting and using results Needs Review   Patient understands prevention, detection, and treatment of acute complications. Needs Review   Patient understands prevention, detection, and treatment of chronic complications. Needs Review   Patient understands how to develop strategies to address psychosocial issues. Needs Review   Patient understands how to develop strategies to promote health/change behavior. Needs Review     Complications   Last HgB A1C per patient/outside source 9.2 %  11/28/15   How often do you check your blood sugar? 3-4  times/day   Fasting Blood glucose range (mg/dL) 29-562;130-86570-129;130-179   Postprandial Blood glucose range (mg/dL) >784;696-295>200;180-200   Number of hypoglycemic episodes per month 1   Can you tell when your blood sugar is low? No   Number of hyperglycemic episodes per week 14   Can you tell when your blood sugar is high? Yes   What do you do if your blood sugar is high? nothing   Have you had a dilated eye exam in the past 12 months? No   Have you had a dental exam in the past 12 months? No    Are you checking your feet? No     Dietary Intake   Breakfast grits, egg, 2 strips bacon or sausage, occasional toast with sugar free jelly, coffee with slenda and milk (OR SKIPS BREAKFAST-half of the time)  8   Snack (morning) none   Lunch SKIPS 50% of the time or Subway (12" meatball and pepperoni)  2   Snack (afternoon) none   Dinner SKIPS if he eats lunch OR hamburger on bun with chili and cheese, potato chips   Snack (evening) occasional ice cream   Beverage(s) Diet Rite or Coke Zero, Sweet tea (makes with splenda at home and sugar out to eat),  VERY LITTLE WATER, coffee with splenda and milk     Exercise   Exercise Type ADL's   How many days per week to you exercise? 0   How many minutes per day do you exercise? 0   Total minutes per week of exercise 0     Patient Education   Previous Diabetes Education Yes (please comment)  very little when in patient   Disease state  Explored patient's options for treatment of their diabetes   Nutrition management  Role of diet in the treatment of diabetes and the relationship between the three main macronutrients and blood glucose level;Food label reading, portion sizes and measuring food.;Carbohydrate counting;Meal options for control of blood glucose level and chronic complications.   Physical activity and exercise  Role of exercise on diabetes management, blood pressure control and cardiac health.;Helped patient identify appropriate exercises in relation to his/her diabetes, diabetes complications and other health issue.   Monitoring Identified appropriate SMBG and/or A1C goals.;Yearly dilated eye exam;Daily foot exams   Acute complications Taught treatment of hypoglycemia - the 15 rule.   Chronic complications Assessed and discussed foot care and prevention of foot problems;Relationship between chronic complications and blood glucose control;Dental care;Retinopathy and reason for yearly dilated eye exams   Psychosocial adjustment Worked  with patient to identify barriers to care and solutions;Role of stress on diabetes;Identified and addressed patients feelings and concerns about diabetes   Personal strategies to promote health Lifestyle issues that need to be addressed for better diabetes care;Review risk of smoking and offered smoking cessation     Individualized Goals (developed by patient)   Nutrition General guidelines for healthy choices and portions discussed   Physical Activity Exercise 5-7 days per week;30 minutes per day   Medications take my medication as prescribed   Monitoring  test my blood glucose as discussed   Reducing Risk examine blood glucose patterns;stop smoking;do foot checks daily;get labs drawn;treat hypoglycemia with 15 grams of carbs if blood glucose less than 70mg /dL;increase portions of nuts and seeds;increase portions of olive oil in diet;increase portions of healthy fats     Post-Education Assessment   Patient understands the diabetes disease and treatment process. Demonstrates understanding / competency   Patient understands incorporating nutritional  management into lifestyle. Needs Review   Patient undertands incorporating physical activity into lifestyle. Demonstrates understanding / competency   Patient understands using medications safely. Demonstrates understanding / competency   Patient understands monitoring blood glucose, interpreting and using results Demonstrates understanding / competency   Patient understands prevention, detection, and treatment of acute complications. Demonstrates understanding / competency   Patient understands prevention, detection, and treatment of chronic complications. Demonstrates understanding / competency   Patient understands how to develop strategies to address psychosocial issues. Demonstrates understanding / competency   Patient understands how to develop strategies to promote health/change behavior. Demonstrates understanding / competency     Outcomes    Expected Outcomes Other (comment)  demonstrated interest but verbalized difficulty in changing.  Expect variable change   Future DMSE PRN   Program Status Completed      Individualized Plan for Diabetes Self-Management Training:   Learning Objective:  Patient will have a greater understanding of diabetes self-management. Patient education plan is to attend individual and/or group sessions per assessed needs and concerns.   Plan:  Discussed carbohydrates and limites to 45-60 grams per meal but patient was resistant to this change. Patient Instructions  Ask your doctor about:  Sex  Eye exam that is inexpensive (to check for retinopathy)  Numbness in your feet  Consider getting your vitamin B-12 checked Be as active as possible.  Aim for 30 minutes most days.  Find something you enjoy.  Continue to enquire about the scholarship at the [facility]. Check your feet daily. Recommend stop smoking Rethink what you drink.  Avoid sugar sweetened tea.  Consider taking Crystal lite packets with you. Drink more water between meals.  Avoid skipping meals. Eat slowly.  Stop when you are satisfied or full. Eat more vegetables.   Expected Outcomes:  Other (comment) (demonstrated interest but verbalized difficulty in changing.  Expect variable change)  Education material provided: Living Well with Diabetes, Food label handouts, A1C conversion sheet, Meal plan card, My Plate and Snack sheet, snack list.  If problems or questions, patient to contact team via:  Phone and Email  Future DSME appointment: PRN

## 2016-03-25 ENCOUNTER — Telehealth (HOSPITAL_COMMUNITY): Payer: Self-pay | Admitting: *Deleted

## 2016-03-25 MED FILL — TRUEPLUS SYR 0.5ML 31GX5/16: 31G X 5/16" | 30 days supply | Qty: 100 | Fill #0

## 2016-03-25 MED FILL — ROSUVASTATIN CALCIUM 40 MG: 40 | 30 days supply | Qty: 30 | Fill #0

## 2016-03-25 NOTE — Telephone Encounter (Signed)
Left message on voicemail in reference to upcoming appointment scheduled for 03/26/16. Phone number given for a call back so details instructions can be given.  Aretta Stetzel Jacqueline   

## 2016-03-26 ENCOUNTER — Ambulatory Visit (HOSPITAL_COMMUNITY): Payer: Self-pay | Attending: Cardiology

## 2016-03-26 ENCOUNTER — Encounter (INDEPENDENT_AMBULATORY_CARE_PROVIDER_SITE_OTHER): Payer: Self-pay

## 2016-03-26 DIAGNOSIS — R931 Abnormal findings on diagnostic imaging of heart and coronary circulation: Secondary | ICD-10-CM | POA: Insufficient documentation

## 2016-03-26 DIAGNOSIS — R072 Precordial pain: Secondary | ICD-10-CM | POA: Insufficient documentation

## 2016-03-26 LAB — MYOCARDIAL PERFUSION IMAGING
CHL CUP NUCLEAR SDS: 1
CHL CUP RESTING HR STRESS: 70 {beats}/min
LHR: 0.36
LV dias vol: 115 mL (ref 62–150)
LVSYSVOL: 51 mL
Peak HR: 88 {beats}/min
SRS: 3
SSS: 4
TID: 1.11

## 2016-03-26 MED ORDER — REGADENOSON 0.4 MG/5ML IV SOLN
0.4000 mg | Freq: Once | INTRAVENOUS | Status: AC
  Administered 2016-03-26: 0.4 mg via INTRAVENOUS

## 2016-03-26 MED ORDER — TECHNETIUM TC 99M TETROFOSMIN IV KIT
31.7000 | PACK | Freq: Once | INTRAVENOUS | Status: AC | PRN
  Administered 2016-03-26: 31.7 via INTRAVENOUS
  Filled 2016-03-26: qty 32

## 2016-03-26 MED ORDER — TECHNETIUM TC 99M TETROFOSMIN IV KIT
10.8000 | PACK | Freq: Once | INTRAVENOUS | Status: AC | PRN
  Administered 2016-03-26: 11 via INTRAVENOUS
  Filled 2016-03-26: qty 11

## 2016-03-27 ENCOUNTER — Telehealth: Payer: Self-pay | Admitting: *Deleted

## 2016-03-27 NOTE — Telephone Encounter (Signed)
Pt has been notified of Myoview and findings. Pt aware will have Dr. Tenny Crawoss review as well for further input. I advised I will call back with whatever recommendations Dr. Tenny Crawoss may have or if she agrees with Bing NeighborsScott W. PA recommendations. Pt also asked about ED problems and he did not know which office he should call, Cardiology or PCP. He states he did not know if heart problems or Diabetes was causing the problem. I explained to pt that actually both of these health issues can cause or contribute to ED. I advised pt though that this should be handled with PCP. Pt thanked me for my time and effort in helping answer his questions today.

## 2016-03-31 ENCOUNTER — Telehealth: Payer: Self-pay | Admitting: *Deleted

## 2016-03-31 NOTE — Telephone Encounter (Signed)
-----   Message from Beatrice LecherScott T Weaver, [place] Jersey[place]-C sent at 03/30/2016  5:21 PM EDT ----- Please inform the patient that his stress test was reviewed with Dr. Dietrich PatesPaula Ross.  Findings overall low risk and likely reflect known blockages.  We recommended continued medical Rx.  FU with me as planned.  If symptoms worsen, call for sooner FU. Please route a copy of this test result to his PCP:  Pete Glatterawn T Langeland, MD  Tereso NewcomerScott Weaver, PA-C    03/30/2016 5:20 PM

## 2016-03-31 NOTE — Telephone Encounter (Signed)
Lmom per Dr. Tenny Crawoss review of myoview as well to continue with current medications. Keep f/u appt as planned. Call if symptoms worsen and need to be seen sooner.

## 2016-04-07 ENCOUNTER — Encounter (HOSPITAL_BASED_OUTPATIENT_CLINIC_OR_DEPARTMENT_OTHER): Payer: Self-pay | Admitting: *Deleted

## 2016-04-07 ENCOUNTER — Emergency Department (HOSPITAL_BASED_OUTPATIENT_CLINIC_OR_DEPARTMENT_OTHER)
Admission: EM | Admit: 2016-04-07 | Discharge: 2016-04-07 | Disposition: A | Discharge status: Home / Self Care | Payer: Self-pay | Attending: Emergency Medicine | Admitting: Emergency Medicine

## 2016-04-07 ENCOUNTER — Emergency Department (HOSPITAL_BASED_OUTPATIENT_CLINIC_OR_DEPARTMENT_OTHER): Payer: Self-pay

## 2016-04-07 DIAGNOSIS — E119 Type 2 diabetes mellitus without complications: Secondary | ICD-10-CM | POA: Insufficient documentation

## 2016-04-07 DIAGNOSIS — Z7982 Long term (current) use of aspirin: Secondary | ICD-10-CM | POA: Insufficient documentation

## 2016-04-07 DIAGNOSIS — I10 Essential (primary) hypertension: Secondary | ICD-10-CM | POA: Insufficient documentation

## 2016-04-07 DIAGNOSIS — F17210 Nicotine dependence, cigarettes, uncomplicated: Secondary | ICD-10-CM | POA: Insufficient documentation

## 2016-04-07 DIAGNOSIS — I2510 Atherosclerotic heart disease of native coronary artery without angina pectoris: Secondary | ICD-10-CM | POA: Insufficient documentation

## 2016-04-07 DIAGNOSIS — Z794 Long term (current) use of insulin: Secondary | ICD-10-CM | POA: Insufficient documentation

## 2016-04-07 DIAGNOSIS — M436 Torticollis: Secondary | ICD-10-CM | POA: Insufficient documentation

## 2016-04-07 MED ORDER — IBUPROFEN 800 MG PO TABS
800.0000 mg | ORAL_TABLET | Freq: Once | ORAL | Status: AC
  Administered 2016-04-07: 800 mg via ORAL

## 2016-04-07 MED ORDER — IBUPROFEN 800 MG PO TABS
ORAL_TABLET | ORAL | Status: AC
  Filled 2016-04-07: qty 1

## 2016-04-07 MED ORDER — IBUPROFEN 800 MG PO TABS: 800.0000 mg | ORAL_TABLET | Freq: Three times a day (TID) | ORAL | 0 refills | Status: DC

## 2016-04-07 NOTE — ED Triage Notes (Signed)
Pain in his neck x 2 days. Limited motion in his neck with movement.  Pain is worse when he turns to the right.

## 2016-04-07 NOTE — ED Provider Notes (Signed)
[facility] DEPT [facility] Provider Note   CSN: [id number] Arrival date & time: 04/07/16  1844  By signing my name below, I, Marc Walker, attest that this documentation has been prepared under the direction and in the presence of Alyse Low, [place]. Electronically Signed: Dora Walker, Scribe. 04/07/2016. 8:01 PM.  History   Chief Complaint Chief Complaint  Patient presents with  . Neck Pain    The history is provided by the patient. No language interpreter was used.     HPI Comments: Marc Walker is a 45 y.o. male who presents to the Emergency Department complaining of sudden onset, constant, worsening, right-sided neck pain for the last two days. Pt also notes pain in his right trapezius. He endorses neck pain exacerbation with twisting his neck. Pt reports he has been sleeping normally. He denies h/o nerve or disc problem in his neck. He denies recent falls or trauma to his neck. Pt notes some numbness/tingling in his arms at baseline due to DM but denies any new numbness since onset. He denies any other complaints or symptoms.  Past Medical History:  Diagnosis Date  . Coronary artery disease   . Diabetes mellitus without complication ([place])   . Hyperlipidemia associated with type 2 diabetes mellitus ([place])   . Hypertension   . MI (myocardial infarction) 07/2009   [facility] in [place], [place] for last 2, 1st one in [place], [place]; total of 6 stents, last one 02/2014     Patient Active Problem List   Diagnosis Date Noted  . DM type 2 with diabetic peripheral neuropathy ([place]) 12/16/2015  . CAD (coronary artery disease) 09/30/2015  . Tobacco abuse 09/30/2015  . Essential hypertension 09/16/2015  . Diabetes mellitus without complication ([place])   . Hyperlipidemia associated with type 2 diabetes mellitus ([place])   . Hyperlipidemia   . Chest pain 09/15/2015  . MI (myocardial infarction) 07/30/2009    Past Surgical History:  Procedure Laterality Date  . CARDIAC  CATHETERIZATION N/A 09/16/2015   Procedure: Left Heart Cath and Coronary Angiography;  Surgeon: Lorretta Harp, MD;  Location: [facility];  Service: Cardiovascular;  Laterality: N/A;  . CARDIAC CATHETERIZATION N/A 09/16/2015   Procedure: Coronary Stent Intervention;  Surgeon: Lorretta Harp, MD;  Location: [facility];  Service: Cardiovascular;  Laterality: N/A;  . CORONARY STENT PLACEMENT         Home Medications    Prior to Admission medications   Medication Sig Start Date End Date Taking? Authorizing Provider  amLODipine (NORVASC) 5 MG tablet Take 1 tablet (5 mg total) by mouth daily. 12/02/15   Maren Reamer, MD  aspirin 81 MG tablet Take 81 mg by mouth daily.    Historical Provider, MD  blood glucose meter kit and supplies KIT Dispense based on patient and insurance preference. Use up to four times daily as directed. (FOR ICD-9 250.00, 250.01). 09/17/15   Ripudeep Krystal Eaton, MD  clopidogrel (PLAVIX) 75 MG tablet Take 1 tablet (75 mg total) by mouth daily. 12/02/15   Fay Records, MD  gabapentin (NEURONTIN) 300 MG capsule Take 3 capsules (900 mg total) by mouth 3 (three) times daily. 03/23/16   Maren Reamer, MD  gabapentin (NEURONTIN) 600 MG tablet Take 1 tablet (600 mg total) by mouth 3 (three) times daily. 12/16/15   Maren Reamer, MD  glucose blood (TRUE METRIX BLOOD GLUCOSE TEST) test strip Use as instructed 03/17/16   Maren Reamer, MD  hydrochlorothiazide (HYDRODIURIL)  25 MG tablet Take 1 tablet (25 mg total) by mouth daily. 12/16/15   Maren Reamer, MD  insulin aspart (NOVOLOG) 100 UNIT/ML injection Inject 15 Units into the skin 3 (three) times daily before meals. If prior meals sugars > 250 units, give 20units of novolog instead. 03/23/16   Maren Reamer, MD  insulin glargine (LANTUS) 100 UNIT/ML injection Inject 0.3 mLs (30 Units total) into the skin 2 (two) times daily. If pm sugar > 300units, than give 35units [place] at night. 03/23/16   Maren Reamer, MD    Insulin Syringes, Disposable, U-100 0.5 ML MISC Take insulin as directed   Diagnosis: Insulin dependent diabetes mellitus ICD10 250 11/04/15   Maren Reamer, MD  isosorbide mononitrate (IMDUR) 60 MG 24 hr tablet Take 60 mg by mouth daily.    Historical Provider, MD  lisinopril (PRINIVIL,ZESTRIL) 10 MG tablet Take 1 tablet (10 mg total) by mouth 2 (two) times daily. 09/30/15   Imogene Burn, PA-C  metFORMIN (GLUCOPHAGE XR) 500 MG 24 hr tablet Take 2 tablets (1,000 mg total) by mouth 2 (two) times daily with a meal. 12/16/15   Maren Reamer, MD  metoprolol (LOPRESSOR) 50 MG tablet Take 1.5 tablets (75 mg total) by mouth 2 (two) times daily. 03/24/16   Liliane Shi, PA-C  nicotine (NICOTROL) 10 MG inhaler Inhale 1 cartridge (1 continuous puffing total) into the lungs as needed for smoking cessation. 09/30/15   Imogene Burn, PA-C  nitroGLYCERIN (NITROSTAT) 0.4 MG SL tablet Place 1 tablet (0.4 mg total) under the tongue every 5 (five) minutes as needed for chest pain. 09/17/15   Ripudeep Krystal Eaton, MD  rosuvastatin (CRESTOR) 40 MG tablet Take 1 tablet (40 mg total) by mouth daily. 03/24/16 06/22/16  Liliane Shi, PA-C  TRUEPLUS LANCETS 28G MISC Use as directed 03/17/16   Maren Reamer, MD    Family History Family History  Problem Relation Age of Onset  . Adopted: Yes  . Diabetes Maternal Grandmother     Social History Social History  Substance Use Topics  . Smoking status: Current Every Day Smoker    Packs/day: 1.00    Years: 33.00    Types: Cigarettes  . Smokeless tobacco: Never Used  . Alcohol use No     Allergies   Other and Levofloxacin   Review of Systems Review of Systems  Musculoskeletal: Positive for myalgias (right trapezius) and neck pain (right side).  Skin: Negative for wound.  Neurological: Positive for numbness (at baseline, no new numbness).  All other systems reviewed and are negative.   Physical Exam Updated Vital Signs BP 160/82   Pulse 78   Temp 97.9  F (36.6 C) (Oral)   Resp 20   Ht 5' 5.5" (1.664 m)   Wt 269 lb (122 kg)   SpO2 97%   BMI 44.08 kg/m   Physical Exam  Constitutional: He is oriented to person, place, and time. He appears well-developed and well-nourished. No distress.  Obese.  HENT:  Head: Normocephalic and atraumatic.  Eyes: Conjunctivae and EOM are normal.  Neck: Neck supple. No tracheal deviation present.  Decreased ROM. Tender sternocleidomastoid and trapezius on the right.   Cardiovascular: Normal rate.   Pulmonary/Chest: Effort normal. No respiratory distress.  Musculoskeletal: Normal range of motion.  Neurological: He is alert and oriented to person, place, and time.  Skin: Skin is warm and dry.  Psychiatric: He has a normal mood and affect. His behavior is normal.  Nursing note and vitals reviewed.   ED Treatments / Results  Labs (all labs ordered are listed, but only abnormal results are displayed) Labs Reviewed - No data to display  EKG  EKG Interpretation None       Radiology No results found.  Procedures Procedures (including critical care time)  DIAGNOSTIC STUDIES: Oxygen Saturation is 97% on RA, normal by my interpretation.    COORDINATION OF CARE: 8:09 PM Discussed treatment plan with pt at bedside and pt agreed to plan.  8:33 PM Patient was given ibuprofen warm compresses. Upon reassessment, he informed the nurse his neck felt completely better and he wants to go home. Pt has refused x-ray.  Medications Ordered in ED Medications  ibuprofen (ADVIL,MOTRIN) 800 MG tablet (  Not Given 04/07/16 1945)  ibuprofen (ADVIL,MOTRIN) tablet 800 mg (800 mg Oral Given 04/07/16 1939)     Initial Impression / Assessment and Plan / ED Course  I have reviewed the triage vital signs and the nursing notes.  Pertinent labs & imaging results that were available during my care of the patient were reviewed by me and considered in my medical decision making (see chart for details).  Clinical  Course      Final Clinical Impressions(s) / ED Diagnoses   Final diagnoses:  Torticollis, acute    New Prescriptions New Prescriptions   No medications on file  An After Visit Summary was printed and given to the patient.   Hollace Kinnier Amelia, PA-C 04/07/16 2035    Veryl Speak, MD 04/07/16 2133

## 2016-04-15 ENCOUNTER — Ambulatory Visit: Payer: Self-pay | Attending: Internal Medicine | Admitting: Pharmacist

## 2016-04-15 ENCOUNTER — Encounter: Payer: Self-pay | Admitting: Pharmacist

## 2016-04-15 DIAGNOSIS — E119 Type 2 diabetes mellitus without complications: Secondary | ICD-10-CM | POA: Insufficient documentation

## 2016-04-15 DIAGNOSIS — Z794 Long term (current) use of insulin: Secondary | ICD-10-CM | POA: Insufficient documentation

## 2016-04-15 DIAGNOSIS — E1142 Type 2 diabetes mellitus with diabetic polyneuropathy: Secondary | ICD-10-CM

## 2016-04-15 MED ORDER — INSULIN GLARGINE 100 UNIT/ML ~~LOC~~ SOLN: 35.0000 [IU] | Freq: Two times a day (BID) | SUBCUTANEOUS | 3 refills | Status: DC

## 2016-04-15 MED ORDER — IBUPROFEN 800 MG PO TABS: 800.0000 mg | ORAL_TABLET | Freq: Three times a day (TID) | ORAL | 0 refills | Status: DC

## 2016-04-15 MED ORDER — INSULIN ASPART 100 UNIT/ML ~~LOC~~ SOLN: 20.0000 [IU] | Freq: Three times a day (TID) | SUBCUTANEOUS | 3 refills | Status: DC

## 2016-04-15 MED FILL — LANTUS 100 UNITS/ML VIAL: 100 | 28 days supply | Qty: 20 | Fill #0

## 2016-04-15 MED FILL — IBUPROFEN 800 MG TABLET: 800 | 7 days supply | Qty: 21 | Fill #0

## 2016-04-15 NOTE — Progress Notes (Signed)
I agree with the pharmacy student's note as documented. Patient with controlled blood glucose in the morning but it becomes elevated throughout the day. Will increase the morning dose of Lantus to 35 units in the morning, decrease the dose to 35 units in the evening (patient often injects up to 50 units of Lantus at night) and increase the Novolog dose to a consistent 20 units TID (which patient typically does already). Patient to follow up with Dr. Julien NordmannLangeland in 2 weeks over concerns of erectile dysfunction but will follow up with me as needed after for diabetes management.  Juanita CraverStacey Karl, PharmD, BCPS, BCACP, CPP Clinical Pharmacist Practitioner  [facility] [phone]

## 2016-04-15 NOTE — Patient Instructions (Signed)
Thanks for coming to see Marc Walker!  Increase the morning Lantus dose 35 units daily  Try to get a good week's worth of blood sugar readings 3-4 times a day (1 before you eat, and 2-3 two hours after you eat)  Come back in 2 weeks to see me

## 2016-04-15 NOTE — Progress Notes (Signed)
S:    Patient arrives in good spirits. Presents for diabetes management.    Patient reports adherence with medications. Current diabetes medications include:  Insulin aspart (Novolog) 100 unit/mL injection; inject 15 units into the skin TID w/ meals. However, patient currently reports on average using 20 units TID w/ meals.  Insulin glargine (Lantus) 100 unit/mL injection; inject 30 units into the skin BID. However, patient currently reports on average using 35 units in the morning and 50 units at night.  Metformin (Glucophage XR) 500 mg 24 hr tablet; take 2 tablets (1000 mg total) by mouth 2 times daily with a meal.   Patient reports one hypoglycemic event in which he woke up to use the bathroom and kept bumping into walls. He reports his blood sugar being around 60 and feeling drunk.  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   O:  . Lab Results  Component Value Date   HGBA1C 8.1 03/23/2016     Home fasting CBG: 70-110  Night CBG: 250-330.  A/P: Diabetes currently uncontrolled based on A1C of 8.1 and home CBG readings. Reports hypoglycemic events and is able to verbalize appropriate hypoglycemia management plan. Reports adherence with medication. Control is suboptimal due to current variable diet/meal times and insulin adjustment.  Increased dose of basal insulin Lantus (insulin glargine) to 35 units injected into the skin BID . Increased dose of rapid insulin Novolog (insulin aspart) to 20 units injected into the skin TID w/ meals.   Next A1C anticipated January 2018.  Written patient instructions provided.  Follow up in Pharmacist Clinic Visit after visit with Dr. Julien NordmannLangeland.   Total time in face to face counseling 20 minutes.

## 2016-04-17 ENCOUNTER — Telehealth: Payer: Self-pay | Admitting: *Deleted

## 2016-04-17 ENCOUNTER — Encounter: Payer: Self-pay | Admitting: Physician Assistant

## 2016-04-17 ENCOUNTER — Ambulatory Visit (INDEPENDENT_AMBULATORY_CARE_PROVIDER_SITE_OTHER): Payer: Self-pay | Admitting: Physician Assistant

## 2016-04-17 VITALS — BP 128/86 | HR 66 | Ht 65.5 in | Wt 275.4 lb

## 2016-04-17 DIAGNOSIS — E7800 Pure hypercholesterolemia, unspecified: Secondary | ICD-10-CM

## 2016-04-17 DIAGNOSIS — I10 Essential (primary) hypertension: Secondary | ICD-10-CM

## 2016-04-17 DIAGNOSIS — R0602 Shortness of breath: Secondary | ICD-10-CM

## 2016-04-17 DIAGNOSIS — I25119 Atherosclerotic heart disease of native coronary artery with unspecified angina pectoris: Secondary | ICD-10-CM

## 2016-04-17 DIAGNOSIS — Z720 Tobacco use: Secondary | ICD-10-CM

## 2016-04-17 LAB — BRAIN NATRIURETIC PEPTIDE: BRAIN NATRIURETIC PEPTIDE: 106.1 pg/mL — AB (ref <100)

## 2016-04-17 LAB — BASIC METABOLIC PANEL
BUN: 10 mg/dL (ref 7–25)
CO2: 25 mmol/L (ref 20–31)
Calcium: 9.1 mg/dL (ref 8.6–10.3)
Chloride: 105 mmol/L (ref 98–110)
Creat: 0.66 mg/dL (ref 0.60–1.35)
GLUCOSE: 42 mg/dL — AB (ref 65–99)
POTASSIUM: 4 mmol/L (ref 3.5–5.3)
SODIUM: 140 mmol/L (ref 135–146)

## 2016-04-17 MED ORDER — AMLODIPINE BESYLATE 5 MG PO TABS: 5.0000 mg | ORAL_TABLET | Freq: Every day | ORAL | 3 refills | Status: DC

## 2016-04-17 MED FILL — AMLODIPINE BESYLATE 5 MG TA: 5 | 30 days supply | Qty: 30 | Fill #0

## 2016-04-17 NOTE — Telephone Encounter (Signed)
I called pt in regards to his lab results and glucose was 42. I advised pt that I showed his lab results today to DOD Dr. Eden EmmsNishan. Pt has been advised kidney function, K+ are fine however he needs to f/u with PCP in regards to his glucose so low and insulins may need adjusting. Pt states to me he took his insulin as directed before leaving for his appt with Bing NeighborsScott W. PA today. Pt states his plan was to have time to stop at [place] for breakfast though this did not happen he said due to the line was too long. Pt states he then just came right over for his appt. Pt agreeable to f/u with PCP about glucose level. I will fax results to PCP, pt aware. Pt appreciated my calling him and checking on him.

## 2016-04-17 NOTE — Progress Notes (Signed)
Cardiology Office Note:    Date:  04/17/2016   ID:  Marc Walker, DOB May 17, 1971, MRN [id number]  PCP:  Maren Reamer, MD  Cardiologist:  Dr. Dorris Carnes   Electrophysiologist:  n/a  Referring MD: Maren Reamer, MD   Chief Complaint  Patient presents with  . Follow-up    CAD    History of Present Illness:    Marc Walker is a 45 y.o. male with a hx of CAD s/p prior myocardial infarction, s/p multiple prior stents placed at outside institutions, DM, HTN, HL, tobacco abuse. He was admitted to [facility] in 3/17 with unstable angina. LHC demonstrated 80% mid RCA stenosis treated with DES, 90% proximal LAD and 70% mid LAD stenosis involving the previous stent treated with DES, 20% RPDA in-stent restenosis treated with angioplasty. The stent in OM2 was patent. Residual disease included 90% D2 stenosis and 70% mid LCx stenosis, treated medically.   I saw him 03/24/16 with complaints of chest pain mainly occurring while standing at the sink in his bathroom shaving.  He also noted exertional chest pain and dyspnea on exertion that was chronic and stable.  I adjusted his beta blocker and arranged and nuclear stress test.  This demonstrated inferolateral and apical ischemia of mild severity. Overall, study was low risk. I reviewed this with Dr. Harrington Challenger and we decided to continue medical therapy.  He returns for FU.    He continues to have chest pain with standing for a long time at the sink shaving.  Symptoms improve with changing position of his arms. He notes exertional chest pain and dyspnea on exertion that is chronic and stable without change.  He denies PND, syncope or significant edema.  He continues to smoke.   Prior CV studies that were reviewed today include:    Myoview 03/26/16  Low risk stress nuclear study with mild ischemia in the distribution of a distal OM or PLA branch.  EF 55  LHC 09/16/15 LAD ostial stent ok, prox 90%, mid 70% ISR, D2 90% LCx mid 70%, OM2 stent  ok RCA mid 80%, dist stent ok, RPDA ostial stent ok with 20% ISR,  PCI: 2.5 x 20 mm Synergy DES to prox/mid LAD; 3.25 x 18 mm Xience [place] DES to mid RCA; Angiosculpt POBA to RPDA  Past Medical History:  Diagnosis Date  . Coronary artery disease   . Diabetes mellitus without complication ([place])   . Hyperlipidemia associated with type 2 diabetes mellitus ([place])   . Hypertension   . MI (myocardial infarction) 07/2009   [facility] in [place], [place] for last 2, 1st one in [place], [place]; total of 6 stents, last one 02/2014     Past Surgical History:  Procedure Laterality Date  . CARDIAC CATHETERIZATION N/A 09/16/2015   Procedure: Left Heart Cath and Coronary Angiography;  Surgeon: Lorretta Harp, MD;  Location: [facility];  Service: Cardiovascular;  Laterality: N/A;  . CARDIAC CATHETERIZATION N/A 09/16/2015   Procedure: Coronary Stent Intervention;  Surgeon: Lorretta Harp, MD;  Location: [facility];  Service: Cardiovascular;  Laterality: N/A;  . CORONARY STENT PLACEMENT      Current Medications: Current Meds  Medication Sig  . aspirin 81 MG tablet Take 81 mg by mouth daily.  . blood glucose meter kit and supplies KIT Dispense based on patient and insurance preference. Use up to four times daily as directed. (FOR ICD-9 250.00, 250.01).  Marland Kitchen clopidogrel (PLAVIX) 75 MG tablet  Take 1 tablet (75 mg total) by mouth daily.  Marland Kitchen gabapentin (NEURONTIN) 300 MG capsule Take 3 capsules (900 mg total) by mouth 3 (three) times daily.  Marland Kitchen glucose blood (TRUE METRIX BLOOD GLUCOSE TEST) test strip Use as instructed  . hydrochlorothiazide (HYDRODIURIL) 25 MG tablet Take 1 tablet (25 mg total) by mouth daily.  Marland Kitchen ibuprofen (ADVIL,MOTRIN) 800 MG tablet Take 1 tablet (800 mg total) by mouth 3 (three) times daily.  . insulin aspart (NOVOLOG) 100 UNIT/ML injection Inject 20 Units into the skin 3 (three) times daily with meals.  . insulin glargine (LANTUS) 100 UNIT/ML injection Inject 0.35  mLs (35 Units total) into the skin 2 (two) times daily.  . Insulin Syringes, Disposable, U-100 0.5 ML MISC Take insulin as directed   Diagnosis: Insulin dependent diabetes mellitus ICD10 250  . lisinopril (PRINIVIL,ZESTRIL) 10 MG tablet Take 1 tablet (10 mg total) by mouth 2 (two) times daily.  . metFORMIN (GLUCOPHAGE XR) 500 MG 24 hr tablet Take 2 tablets (1,000 mg total) by mouth 2 (two) times daily with a meal.  . metoprolol (LOPRESSOR) 50 MG tablet Take 1.5 tablets (75 mg total) by mouth 2 (two) times daily.  . nicotine (NICOTROL) 10 MG inhaler Inhale 1 cartridge (1 continuous puffing total) into the lungs as needed for smoking cessation.  . nitroGLYCERIN (NITROSTAT) 0.4 MG SL tablet Place 1 tablet (0.4 mg total) under the tongue every 5 (five) minutes as needed for chest pain.  . rosuvastatin (CRESTOR) 40 MG tablet Take 1 tablet (40 mg total) by mouth daily.  . TRUEPLUS LANCETS 28G MISC Use as directed     Allergies:   Other and Levofloxacin   Social History   Social History  . Marital status: Divorced    Spouse name: N/A  . Number of children: N/A  . Years of education: N/A   Occupational History  . Biochemist, clinical    Social History Main Topics  . Smoking status: Current Every Day Smoker    Packs/day: 1.00    Years: 33.00    Types: Cigarettes  . Smokeless tobacco: Never Used  . Alcohol use No  . Drug use: No  . Sexual activity: Not Asked   Other Topics Concern  . None   Social History Narrative   Adopted. Very little info on biological parents, ?diabetes.   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 He was adopted.   ROS:   Please see the history of present illness.    Review of Systems  Constitution: Positive for malaise/fatigue and weight gain.  Cardiovascular: Positive for dyspnea on exertion.  Respiratory: Positive for snoring and wheezing.    All other systems  reviewed and are negative.   EKGs/Labs/Other Test Reviewed:    EKG:  EKG is not ordered today.  The ekg ordered today demonstrates n/a  Recent Labs: 09/17/2015: Hemoglobin 12.8; Platelets 177 12/17/2015: BUN 14; Creat 0.65; Potassium 4.7; Sodium 136 03/23/2016: ALT 11   Recent Lipid Panel    Component Value Date/Time   CHOL 147 03/23/2016 0744   TRIG 102 03/23/2016 0744   HDL 41 03/23/2016 0744   CHOLHDL 3.6 03/23/2016 0744   VLDL 20 03/23/2016 0744   LDLCALC 86 03/23/2016 0744     Physical Exam:    VS:  BP 128/86   Pulse 66   Ht 5' 5.5" (1.664 m)   Wt 275 lb 6.4  oz (124.9 kg)   BMI 45.13 kg/m     Wt Readings from Last 3 Encounters:  04/17/16 275 lb 6.4 oz (124.9 kg)  04/07/16 269 lb (122 kg)  03/24/16 269 lb (122 kg)     Physical Exam  Constitutional: He is oriented to person, place, and time. He appears well-developed and well-nourished. No distress.  HENT:  Head: Normocephalic and atraumatic.  Eyes: No scleral icterus.  Neck: Normal range of motion.  I cannot assess JVD  Cardiovascular: Normal rate, regular rhythm, S1 normal and S2 normal.   No murmur heard. Pulmonary/Chest: Effort normal and breath sounds normal. He has no wheezes. He has no rhonchi. He has no rales.  Abdominal: Soft. There is no tenderness.  Musculoskeletal: He exhibits edema.  Trace bilat LE edema  Neurological: He is alert and oriented to person, place, and time.  Skin: Skin is warm and dry.  Psychiatric: He has a normal mood and affect.    ASSESSMENT:    1. Coronary artery disease involving native coronary artery of native heart with angina pectoris ([place])   2. Essential hypertension   3. Pure hypercholesterolemia   4. Tobacco abuse   5. Shortness of breath    PLAN:    In order of problems listed above:  1. CAD - s/p multiple prior PCIs.  Most recent was in 3/17 with a DES to the proximal/mid LAD and DES to the mid RCA.  Recent Myoview low risk with inferolateral and apical  ischemia. As noted, this was reviewed with Dr. Harrington Challenger and we decided to pursue medical Rx.  His symptoms are stable.  He is off nitrates and amlodipine for unclear reasons.  He states he never had these refilled.    -  Continue ASA, Plavix, statin, beta-blocker   -  Restart Amlodipine 5 QD.  -  FU in 4-6 weeks.    -  Consider cath if symptoms worsen or do not improve with med Rx.  2. HTN - Controlled.  He shows me much higher BP readings at home. He should be able to tol the add'n of Amlodipine.   3. HL - LDL was > 70 on high dose Atorva.  Recently changed to Crestor 40.  Repeat Lipids and LFTs will be done in 3 mos.   4. Tobacco abuse - He knows he needs to quit.  5.  Shortness of breath - His LVEDP was elevated at his Cath in 3/17.  Question if he has diastolic dysfunction.  Will get BNP, BMET today and order an Echo to assess diastolic function.  If BNP high, will change HCTZ to Lasix.    Medication Adjustments/Labs and Tests Ordered: Current medicines are reviewed at length with the patient today.  Concerns regarding medicines are outlined above.  Medication changes, Labs and Tests ordered today are outlined in the Patient Instructions noted below. Patient Instructions  Medication Instructions:  START AMLODIPINE 5 MG DAILY; RX HAS BEEN SENT IN  Labwork: 1. TODAY BMET, BNP  Testing/Procedures: 1. Your physician has requested that you have an echocardiogram. Echocardiography is a painless test that uses sound waves to create images of your heart. It provides your doctor with information about the size and shape of your heart and how well your heart's chambers and valves are working. This procedure takes approximately one hour. There are no restrictions for this procedure.  Follow-Up: 1. 4-6 WEEKS WITH DR. ROSS 2. YOU ARE BEING REFERRED TO DR. Radford Pax; DX SLEEP APNEA  Any Other  Special Instructions Will Be Listed Below (If Applicable).  If you need a refill on your cardiac medications  before your next appointment, please call your pharmacy.  Signed, [name], PA-C  04/17/2016 1:58 PM    [facility] [address], [place], [place]  [address] Phone: [phone]; Fax: [phone]

## 2016-04-17 NOTE — Patient Instructions (Addendum)
Medication Instructions:  START AMLODIPINE 5 MG DAILY; RX HAS BEEN SENT IN  Labwork: 1. TODAY BMET, BNP  Testing/Procedures: 1. Your physician has requested that you have an echocardiogram. Echocardiography is a painless test that uses sound waves to create images of your heart. It provides your doctor with information about the size and shape of your heart and how well your heart's chambers and valves are working. This procedure takes approximately one hour. There are no restrictions for this procedure.  Follow-Up: 1. 4-6 WEEKS WITH DR. ROSS 2. YOU ARE BEING REFERRED TO DR. Mayford KnifeURNER; DX SLEEP APNEA  Any Other Special Instructions Will Be Listed Below (If Applicable).  If you need a refill on your cardiac medications before your next appointment, please call your pharmacy.

## 2016-04-20 NOTE — Progress Notes (Signed)
Pt is unavailable at this time will try again before the 27th

## 2016-04-28 MED FILL — TRUEPLUS SYR 0.5ML 31GX5/16: 31G X 5/16" | 30 days supply | Qty: 100 | Fill #1

## 2016-04-28 MED FILL — ?METOPROLOL 50 MG TABLET: 50 | 30 days supply | Qty: 90 | Fill #1

## 2016-04-28 MED FILL — TRUE METRIX TEST STRIP: 30 days supply | Qty: 100 | Fill #1

## 2016-04-28 MED FILL — TRUEplus LANCETS 28G MISC: 30 days supply | Qty: 100 | Fill #1

## 2016-04-28 MED FILL — ROSUVASTATIN CALCIUM 40 MG: 40 | 30 days supply | Qty: 30 | Fill #1

## 2016-04-28 MED FILL — GABAPENTIN 300 MG CAPSULE: 300 | 20 days supply | Qty: 180 | Fill #1

## 2016-04-28 MED FILL — !NOVOLOG 100UNITS/ML VIAL: 100/ML | 28 days supply | Qty: 20 | Fill #1

## 2016-04-29 ENCOUNTER — Ambulatory Visit: Payer: Self-pay | Admitting: Pharmacist

## 2016-05-01 ENCOUNTER — Other Ambulatory Visit: Payer: Self-pay | Admitting: Internal Medicine

## 2016-05-01 MED FILL — CLOPIDOGREL 75 MG TABLET: 75 | 30 days supply | Qty: 30 | Fill #4

## 2016-05-01 MED FILL — !LANTUS 100 UNITS/ML VIAL: 100 | 15 days supply | Qty: 10 | Fill #1

## 2016-05-01 MED FILL — METFORMIN HCL ER 500 MG TAB: 500 | 30 days supply | Qty: 120 | Fill #0

## 2016-05-01 MED FILL — ?LISINOPRIL 10 MG TABLET: 10 | 30 days supply | Qty: 60 | Fill #3

## 2016-05-01 MED FILL — HYDROCHLOROTHIAZIDE 25 MG T: 25 | 30 days supply | Qty: 30 | Fill #3

## 2016-05-11 ENCOUNTER — Other Ambulatory Visit (HOSPITAL_COMMUNITY): Payer: Self-pay

## 2016-05-16 ENCOUNTER — Encounter (HOSPITAL_BASED_OUTPATIENT_CLINIC_OR_DEPARTMENT_OTHER): Payer: Self-pay | Admitting: Emergency Medicine

## 2016-05-16 ENCOUNTER — Emergency Department (HOSPITAL_BASED_OUTPATIENT_CLINIC_OR_DEPARTMENT_OTHER): Payer: Self-pay

## 2016-05-16 ENCOUNTER — Emergency Department (HOSPITAL_BASED_OUTPATIENT_CLINIC_OR_DEPARTMENT_OTHER)
Admission: EM | Admit: 2016-05-16 | Discharge: 2016-05-16 | Disposition: A | Discharge status: Home / Self Care | Payer: Self-pay | Attending: Emergency Medicine | Admitting: Emergency Medicine

## 2016-05-16 DIAGNOSIS — Z794 Long term (current) use of insulin: Secondary | ICD-10-CM | POA: Insufficient documentation

## 2016-05-16 DIAGNOSIS — I10 Essential (primary) hypertension: Secondary | ICD-10-CM | POA: Insufficient documentation

## 2016-05-16 DIAGNOSIS — J40 Bronchitis, not specified as acute or chronic: Secondary | ICD-10-CM | POA: Insufficient documentation

## 2016-05-16 DIAGNOSIS — Z79899 Other long term (current) drug therapy: Secondary | ICD-10-CM | POA: Insufficient documentation

## 2016-05-16 DIAGNOSIS — E119 Type 2 diabetes mellitus without complications: Secondary | ICD-10-CM | POA: Insufficient documentation

## 2016-05-16 DIAGNOSIS — F17210 Nicotine dependence, cigarettes, uncomplicated: Secondary | ICD-10-CM | POA: Insufficient documentation

## 2016-05-16 DIAGNOSIS — I2510 Atherosclerotic heart disease of native coronary artery without angina pectoris: Secondary | ICD-10-CM | POA: Insufficient documentation

## 2016-05-16 DIAGNOSIS — Z7982 Long term (current) use of aspirin: Secondary | ICD-10-CM | POA: Insufficient documentation

## 2016-05-16 MED ORDER — ALBUTEROL SULFATE HFA 108 (90 BASE) MCG/ACT IN AERS
1.0000 | INHALATION_SPRAY | RESPIRATORY_TRACT | Status: DC | PRN
  Administered 2016-05-16: 2 via RESPIRATORY_TRACT
  Filled 2016-05-16: qty 6.7

## 2016-05-16 MED ORDER — HYDROCODONE-HOMATROPINE 5-1.5 MG/5ML PO SYRP: 5.0000 mL | ORAL_SOLUTION | Freq: Four times a day (QID) | ORAL | 0 refills | Status: DC | PRN

## 2016-05-16 NOTE — ED Notes (Signed)
States has had a dry cough for 1 week

## 2016-05-16 NOTE — ED Triage Notes (Signed)
Pt stats persistent dry cough for past week along with SOB.  SOB and coughing worse when lying flat.

## 2016-05-16 NOTE — ED Provider Notes (Signed)
[facility] Provider Note   CSN: [id number] Arrival date & time: 05/16/16  1306     History   Chief Complaint Chief Complaint  Patient presents with  . Cough  . Shortness of Breath    HPI Marc Walker is a 45 y.o. male.  The history is provided by the patient.  Cough  This is a new problem. Episode onset: 1 week. The problem occurs constantly. The problem has not changed since onset.The cough is non-productive. There has been no fever. Associated symptoms include shortness of breath and wheezing. Pertinent negatives include no chest pain, no chills, no sweats, no headaches, no rhinorrhea and no sore throat. He has tried nothing for the symptoms. The treatment provided no relief. He is a smoker. His past medical history is significant for bronchitis. His past medical history does not include asthma.  Shortness of Breath  Associated symptoms include cough and wheezing. Pertinent negatives include no headaches, no rhinorrhea, no sore throat and no chest pain. Associated medical issues do not include asthma.    Past Medical History:  Diagnosis Date  . Coronary artery disease   . Diabetes mellitus without complication ([facility])   . High cholesterol   . Hyperlipidemia associated with type 2 diabetes mellitus ([place])   . Hypertension   . MI (myocardial infarction) 07/2009   [facility] in [place], [place] for last 2, 1st one in [place], [place]; total of 6 stents, last one 02/2014     Patient Active Problem List   Diagnosis Date Noted  . DM type 2 with diabetic peripheral neuropathy ([place]) 12/16/2015  . CAD (coronary artery disease) 09/30/2015  . Tobacco abuse 09/30/2015  . Essential hypertension 09/16/2015  . Diabetes mellitus without complication ([place])   . Hyperlipidemia associated with type 2 diabetes mellitus ([place])   . Hyperlipidemia   . Chest pain 09/15/2015  . MI (myocardial infarction) 07/30/2009    Past Surgical History:  Procedure Laterality Date  .  CARDIAC CATHETERIZATION N/A 09/16/2015   Procedure: Left Heart Cath and Coronary Angiography;  Surgeon: Lorretta Harp, MD;  Location: [facility];  Service: Cardiovascular;  Laterality: N/A;  . CARDIAC CATHETERIZATION N/A 09/16/2015   Procedure: Coronary Stent Intervention;  Surgeon: Lorretta Harp, MD;  Location: [facility];  Service: Cardiovascular;  Laterality: N/A;  . CORONARY STENT PLACEMENT         Home Medications    Prior to Admission medications   Medication Sig Start Date End Date Taking? Authorizing Provider  amLODipine (NORVASC) 5 MG tablet Take 1 tablet (5 mg total) by mouth daily. 04/17/16 07/16/16  Liliane Shi, PA-C  aspirin 81 MG tablet Take 81 mg by mouth daily.    Historical Provider, MD  blood glucose meter kit and supplies KIT Dispense based on patient and insurance preference. Use up to four times daily as directed. (FOR ICD-9 250.00, 250.01). 09/17/15   Ripudeep Krystal Eaton, MD  clopidogrel (PLAVIX) 75 MG tablet Take 1 tablet (75 mg total) by mouth daily. 12/02/15   Fay Records, MD  gabapentin (NEURONTIN) 300 MG capsule Take 3 capsules (900 mg total) by mouth 3 (three) times daily. 03/23/16   Maren Reamer, MD  glucose blood (TRUE METRIX BLOOD GLUCOSE TEST) test strip Use as instructed 03/17/16   Maren Reamer, MD  hydrochlorothiazide (HYDRODIURIL) 25 MG tablet Take 1 tablet (25 mg total) by mouth daily. 12/16/15   Maren Reamer, MD  ibuprofen (ADVIL,MOTRIN) 800  MG tablet Take 1 tablet (800 mg total) by mouth 3 (three) times daily. 04/15/16   Maren Reamer, MD  insulin aspart (NOVOLOG) 100 UNIT/ML injection Inject 20 Units into the skin 3 (three) times daily with meals. 04/15/16   Maren Reamer, MD  insulin glargine (LANTUS) 100 UNIT/ML injection Inject 0.35 mLs (35 Units total) into the skin 2 (two) times daily. 04/15/16   Tresa Garter, MD  Insulin Syringes, Disposable, U-100 0.5 ML MISC Take insulin as directed   Diagnosis: Insulin  dependent diabetes mellitus ICD10 250 11/04/15   Maren Reamer, MD  lisinopril (PRINIVIL,ZESTRIL) 10 MG tablet Take 1 tablet (10 mg total) by mouth 2 (two) times daily. 09/30/15   Imogene Burn, PA-C  metFORMIN (GLUCOPHAGE-XR) 500 MG 24 hr tablet TAKE 2 TABLETS BY MOUTH 2 TIMES DAILY WITH A MEAL. 05/01/16   Maren Reamer, MD  metoprolol (LOPRESSOR) 50 MG tablet Take 1.5 tablets (75 mg total) by mouth 2 (two) times daily. 03/24/16   Liliane Shi, PA-C  nicotine (NICOTROL) 10 MG inhaler Inhale 1 cartridge (1 continuous puffing total) into the lungs as needed for smoking cessation. 09/30/15   Imogene Burn, PA-C  nitroGLYCERIN (NITROSTAT) 0.4 MG SL tablet Place 1 tablet (0.4 mg total) under the tongue every 5 (five) minutes as needed for chest pain. 09/17/15   Ripudeep Krystal Eaton, MD  rosuvastatin (CRESTOR) 40 MG tablet Take 1 tablet (40 mg total) by mouth daily. 03/24/16 06/22/16  Liliane Shi, PA-C  TRUEPLUS LANCETS 28G MISC Use as directed 03/17/16   Maren Reamer, MD    Family History Family History  Problem Relation Age of Onset  . Adopted: Yes  . Diabetes Maternal Grandmother     Social History Social History  Substance Use Topics  . Smoking status: Current Every Day Smoker    Packs/day: 1.00    Years: 33.00    Types: Cigarettes  . Smokeless tobacco: Never Used  . Alcohol use No     Allergies   Other and Levofloxacin   Review of Systems Review of Systems  Constitutional: Negative for chills.  HENT: Negative for rhinorrhea and sore throat.   Respiratory: Positive for cough, shortness of breath and wheezing.   Cardiovascular: Negative for chest pain.  Neurological: Negative for headaches.  All other systems reviewed and are negative.    Physical Exam Updated Vital Signs BP 130/66 (BP Location: Right Arm)   Pulse 72   Temp 98 F (36.7 C) (Oral)   Resp 21   Ht 5' 5" (1.651 m)   Wt 270 lb (122.5 kg)   SpO2 96%   BMI 44.93 kg/m   Physical Exam  Constitutional:  He is oriented to person, place, and time. He appears well-developed and well-nourished. No distress.  HENT:  Head: Normocephalic and atraumatic.  Mouth/Throat: Oropharynx is clear and moist.  Eyes: Conjunctivae and EOM are normal. Pupils are equal, round, and reactive to light.  Neck: Normal range of motion. Neck supple.  Cardiovascular: Normal rate, regular rhythm and intact distal pulses.   No murmur heard. Pulmonary/Chest: Effort normal. No respiratory distress. He has decreased breath sounds. He has no wheezes. He has no rales.  Coughing throughout exam but no current wheezing.    Abdominal: Soft. He exhibits no distension. There is no tenderness. There is no rebound and no guarding.  Musculoskeletal: Normal range of motion. He exhibits no edema or tenderness.  No lower ext edema  Neurological: He  is alert and oriented to person, place, and time.  Skin: Skin is warm and dry. No rash noted. No erythema.  Psychiatric: He has a normal mood and affect. His behavior is normal.  Nursing note and vitals reviewed.    ED Treatments / Results  Labs (all labs ordered are listed, but only abnormal results are displayed) Labs Reviewed - No data to display  EKG  EKG Interpretation None       Radiology Dg Chest 2 View  Result Date: 05/16/2016 CLINICAL DATA:  45 year old male with shortness of breath and cough for 1 week. EXAM: CHEST  2 VIEW COMPARISON:  04/13/2016 and prior exams FINDINGS: The cardiomediastinal silhouette is unremarkable. Mild peribronchial thickening is unchanged. There is no evidence of focal airspace disease, pulmonary edema, suspicious pulmonary nodule/mass, pleural effusion, or pneumothorax. No acute bony abnormalities are identified. IMPRESSION: No active cardiopulmonary disease. Electronically Signed   By: Margarette Canada M.D.   On: 05/16/2016 14:21    Procedures Procedures (including critical care time)  Medications Ordered in ED Medications  albuterol  (PROVENTIL HFA;VENTOLIN HFA) 108 (90 Base) MCG/ACT inhaler 1-2 puff (not administered)     Initial Impression / Assessment and Plan / ED Course  I have reviewed the triage vital signs and the nursing notes.  Pertinent labs & imaging results that were available during my care of the patient were reviewed by me and considered in my medical decision making (see chart for details).  Clinical Course    Pt with symptoms consistent with viral URI/bronchitis.  Well appearing here.  No signs of breathing difficulty  No signs of pharyngitis, otitis or abnormal abdominal findings.  No evidence of fluid overload and pt admits to still smoking.  He is on ace inhibitor however has been on this for long time and low suspicion that this is an ace cough. CXR wnl and pt to return with any further problems.  Pt given albuterol inhaler for cough and cough medication.  Sats wnl.  Avoided prednisone due to his difficult to control DM.  Also to use mucinex and f/u with pcp on Wednesday if not better  Final Clinical Impressions(s) / ED Diagnoses   Final diagnoses:  Bronchitis    New Prescriptions New Prescriptions   HYDROCODONE-HOMATROPINE (HYCODAN) 5-1.5 MG/5ML SYRUP    Take 5 mLs by mouth every 6 (six) hours as needed for cough.     Blanchie Dessert, MD 05/16/16 1501

## 2016-05-24 ENCOUNTER — Emergency Department (HOSPITAL_BASED_OUTPATIENT_CLINIC_OR_DEPARTMENT_OTHER)
Admission: EM | Admit: 2016-05-24 | Discharge: 2016-05-24 | Disposition: A | Discharge status: Home / Self Care | Payer: Self-pay | Attending: Emergency Medicine | Admitting: Emergency Medicine

## 2016-05-24 ENCOUNTER — Encounter (HOSPITAL_BASED_OUTPATIENT_CLINIC_OR_DEPARTMENT_OTHER): Payer: Self-pay | Admitting: *Deleted

## 2016-05-24 ENCOUNTER — Emergency Department (HOSPITAL_BASED_OUTPATIENT_CLINIC_OR_DEPARTMENT_OTHER): Payer: Self-pay

## 2016-05-24 DIAGNOSIS — J40 Bronchitis, not specified as acute or chronic: Secondary | ICD-10-CM | POA: Insufficient documentation

## 2016-05-24 DIAGNOSIS — F17210 Nicotine dependence, cigarettes, uncomplicated: Secondary | ICD-10-CM | POA: Insufficient documentation

## 2016-05-24 DIAGNOSIS — I2510 Atherosclerotic heart disease of native coronary artery without angina pectoris: Secondary | ICD-10-CM | POA: Insufficient documentation

## 2016-05-24 DIAGNOSIS — I10 Essential (primary) hypertension: Secondary | ICD-10-CM | POA: Insufficient documentation

## 2016-05-24 DIAGNOSIS — E119 Type 2 diabetes mellitus without complications: Secondary | ICD-10-CM | POA: Insufficient documentation

## 2016-05-24 DIAGNOSIS — Z79899 Other long term (current) drug therapy: Secondary | ICD-10-CM | POA: Insufficient documentation

## 2016-05-24 DIAGNOSIS — Z7982 Long term (current) use of aspirin: Secondary | ICD-10-CM | POA: Insufficient documentation

## 2016-05-24 DIAGNOSIS — Z794 Long term (current) use of insulin: Secondary | ICD-10-CM | POA: Insufficient documentation

## 2016-05-24 HISTORY — DX: Heartburn: R12

## 2016-05-24 MED ORDER — AZITHROMYCIN 250 MG PO TABS: ORAL_TABLET | ORAL | 0 refills | Status: DC

## 2016-05-24 MED ORDER — BENZONATATE 100 MG PO CAPS: 100.0000 mg | ORAL_CAPSULE | Freq: Three times a day (TID) | ORAL | 0 refills | Status: DC

## 2016-05-24 NOTE — Discharge Instructions (Signed)
Please read and follow all provided instructions.  Your diagnoses today include:  1. Bronchitis     Tests performed today include: Vital signs. See below for your results today.   Medications prescribed:  Take as prescribed   Home care instructions:  Follow any educational materials contained in this packet.  Follow-up instructions: Please follow-up with your primary care provider for further evaluation of symptoms and treatment   Return instructions:  Please return to the Emergency Department if you do not get better, if you get worse, or new symptoms OR  - Fever (temperature greater than 101.80F)  - Bleeding that does not stop with holding pressure to the area    -Severe pain (please note that you may be more sore the day after your accident)  - Chest Pain  - Difficulty breathing  - Severe nausea or vomiting  - Inability to tolerate food and liquids  - Passing out  - Skin becoming red around your wounds  - Change in mental status (confusion or lethargy)  - New numbness or weakness    Please return if you have any other emergent concerns.  Additional Information:  Your vital signs today were: BP 150/84 (BP Location: Right Arm)    Pulse 73    Temp 97.7 F (36.5 C) (Oral)    Resp 24    Ht 5' 5.5" (1.664 m)    Wt 122.5 kg    SpO2 98%    BMI 44.25 kg/m  If your blood pressure (BP) was elevated above 135/85 this visit, please have this repeated by your doctor within one month. ---------------

## 2016-05-24 NOTE — ED Triage Notes (Signed)
Patient states he has a three week history of non productive cough.  Was seen here for the same one week ago.  Cough is getting worse.

## 2016-05-24 NOTE — Progress Notes (Signed)
Cardiology Office Note   Date:  05/25/2016   ID:  Marc Walker, DOB [birth year], MRN [id number]  PCP:  Maren Reamer, MD  Cardiologist:   Dorris Carnes, MD    F?U of CAD     History of Present Illness: Marc Walker is a 45 y.o. male with a history ofCAD s/pprior myocardial infarction, s/p multipleprior stents placedat outside institutions, DM, HTN, HL, tobacco abuse. He was admitted to [facility] in 3/17 with unstable angina. LHC demonstrated 80% mid RCA stenosis treated with DES, 90% proximal LAD and 70% mid LAD stenosis involving the previous stent treated with DES, 20% RPDA in-stent restenosis treated with angioplasty. The stent in OM2 was patent. Residual disease included 90% D2 stenosis and 70% mid LCx stenosis, treated medically.   I saw him 03/24/16 with complaints of chest pain mainly occurring while standing at the sink in his bathroom shaving.  He also noted exertional chest pain and dyspnea on exertion that was chronic and stable.  I adjusted his beta blocker and arranged and nuclear stress test.  This demonstrated inferolateral and apical ischemia of mild severity. Overall, study was low risk. I reviewed this with Dr. Harrington Challenger and we decided to continue medical therapy.  He returns for FU.    He continues to have chest pain with standing for a long time at the sink shaving.  Symptoms improve with changing position of his arms. He notes exertional chest pain and dyspnea on exertion that is chronic and stable without change.  He denies PND, syncope or significant edema.  He continues to smoke.   She was seen by Kathleen Argue in Oct  Plan was for medical Rx  Amlodipine was started  Plan for cath if symptoms worseneed  He is not sure if he is taking amlodpine SInce seen he continues to be SOB  Complains of fatigue Ankles swollen  This is new  Says he watches salt  Seen in ER yesterday  Dx bronchitis  ON erythromycin and Tessalon pearls  Says his weight keeps going up     Does work at [employer]    COmplains of erectile dysfunction     Outpatient Medications Prior to Visit  Medication Sig Dispense Refill  . aspirin 81 MG tablet Take 81 mg by mouth daily.    Marland Kitchen azithromycin (ZITHROMAX Z-PAK) 250 MG tablet 529m PO once daily on day 1. THEN 2574mPO once daily for 4 days 6 tablet 0  . benzonatate (TESSALON) 100 MG capsule Take 1 capsule (100 mg total) by mouth every 8 (eight) hours. 21 capsule 0  . blood glucose meter kit and supplies KIT Dispense based on patient and insurance preference. Use up to four times daily as directed. (FOR ICD-9 250.00, 250.01). 1 each 0  . clopidogrel (PLAVIX) 75 MG tablet Take 1 tablet (75 mg total) by mouth daily. 30 tablet 10  . gabapentin (NEURONTIN) 300 MG capsule Take 3 capsules (900 mg total) by mouth 3 (three) times daily. 180 capsule 3  . glucose blood (TRUE METRIX BLOOD GLUCOSE TEST) test strip Use as instructed 100 each 12  . hydrochlorothiazide (HYDRODIURIL) 25 MG tablet Take 1 tablet (25 mg total) by mouth daily. 90 tablet 3  . ibuprofen (ADVIL,MOTRIN) 800 MG tablet Take 1 tablet (800 mg total) by mouth 3 (three) times daily. 21 tablet 0  . insulin aspart (NOVOLOG) 100 UNIT/ML injection Inject 20 Units into the skin 3 (three) times daily with meals. 30 vial  3  . insulin glargine (LANTUS) 100 UNIT/ML injection Inject 0.35 mLs (35 Units total) into the skin 2 (two) times daily. 30 mL 3  . Insulin Syringes, Disposable, U-100 0.5 ML MISC Take insulin as directed   Diagnosis: Insulin dependent diabetes mellitus ICD10 250 1 each 0  . lisinopril (PRINIVIL,ZESTRIL) 10 MG tablet Take 1 tablet (10 mg total) by mouth 2 (two) times daily. 60 tablet 3  . metFORMIN (GLUCOPHAGE-XR) 500 MG 24 hr tablet TAKE 2 TABLETS BY MOUTH 2 TIMES DAILY WITH A MEAL. 120 tablet 2  . metoprolol (LOPRESSOR) 50 MG tablet Take 1.5 tablets (75 mg total) by mouth 2 (two) times daily. 90 tablet 11  . nicotine (NICOTROL) 10 MG inhaler Inhale 1  cartridge (1 continuous puffing total) into the lungs as needed for smoking cessation. 42 each 0  . nitroGLYCERIN (NITROSTAT) 0.4 MG SL tablet Place 1 tablet (0.4 mg total) under the tongue every 5 (five) minutes as needed for chest pain. 30 tablet 12  . rosuvastatin (CRESTOR) 40 MG tablet Take 1 tablet (40 mg total) by mouth daily. 90 tablet 3  . TRUEPLUS LANCETS 28G MISC Use as directed 100 each 12  . amLODipine (NORVASC) 5 MG tablet Take 1 tablet (5 mg total) by mouth daily. (Patient not taking: Reported on 05/25/2016) 90 tablet 3  . HYDROcodone-homatropine (HYCODAN) 5-1.5 MG/5ML syrup Take 5 mLs by mouth every 6 (six) hours as needed for cough. (Patient not taking: Reported on 05/25/2016) 120 mL 0   No facility-administered medications prior to visit.      Allergies:   Other and Levofloxacin   Past Medical History:  Diagnosis Date  . Coronary artery disease   . Diabetes mellitus without complication ([place])   . Heartburn   . High cholesterol   . Hyperlipidemia associated with type 2 diabetes mellitus ([place])   . Hypertension   . MI (myocardial infarction) 07/2009   [facility] in [place], [place] for last 2, 1st one in [place], [place]; total of 6 stents, last one 02/2014   . Thyroid disease    past history    Past Surgical History:  Procedure Laterality Date  . APPENDECTOMY    . CARDIAC CATHETERIZATION N/A 09/16/2015   Procedure: Left Heart Cath and Coronary Angiography;  Surgeon: Lorretta Harp, MD;  Location: [facility];  Service: Cardiovascular;  Laterality: N/A;  . CARDIAC CATHETERIZATION N/A 09/16/2015   Procedure: Coronary Stent Intervention;  Surgeon: Lorretta Harp, MD;  Location: [facility];  Service: Cardiovascular;  Laterality: N/A;  . CORONARY STENT PLACEMENT       Social History:  The patient  reports that he has been smoking Cigarettes.  He has a 33.00 pack-year smoking history. He has never used smokeless tobacco. He reports that he does not  drink alcohol or use drugs.   Family History:  The patient's family history includes Diabetes in his maternal grandmother. He was adopted.    ROS:  Please see the history of present illness. All other systems are reviewed and  Negative to the above problem except as noted.    PHYSICAL EXAM: VS:  BP 138/84   Pulse 71   Ht 5' 5.5" (1.664 m)   Wt 287 lb 12.8 oz (130.5 kg)   SpO2 96%   BMI 47.16 kg/m   GEN: Morbidly obese , in no acute distress HEENT: normal Neck: Neck full   carotid bruits, or masses Cardiac: RRR; no murmurs, rubs, or  gallops,Tr  edema  Respiratory:  clear to auscultation bilaterally, normal work of breathing GI: soft, nontender, nondistended, + BS  No hepatomegaly  MS: no deformity Moving all extremities   Skin: warm and dry, no rash Neuro:  Strength and sensation are intact Psych: euthymic mood, full affect   EKG:  EKG is not  ordered today.   Lipid Panel    Component Value Date/Time   CHOL 147 03/23/2016 0744   TRIG 102 03/23/2016 0744   HDL 41 03/23/2016 0744   CHOLHDL 3.6 03/23/2016 0744   VLDL 20 03/23/2016 0744   LDLCALC 86 03/23/2016 0744      Wt Readings from Last 3 Encounters:  05/25/16 287 lb 12.8 oz (130.5 kg)  05/24/16 270 lb (122.5 kg)  05/16/16 270 lb (122.5 kg)      ASSESSMENT AND PLAN:  1  Dypsnea / fatigue  Exacerbated by bronchitis  He is moving air well  No wheezes Does have mild edema  Does admit to eating sauses  Would ad lasix 40 every other day  10 KCL  2  CAD  Myovue done earlier this fall  Low risk scan  Keep on plavix  Does have dz  3  HL  Keep on Cresitor  4     Current medicines are reviewed at length with the patient today.  The patient does not have concerns regarding medicines.  Signed, Dorris Carnes, MD  05/25/2016 11:01 AM    [address], [place], [place]  [address] Phone: [phone]; Fax: [phone]

## 2016-05-24 NOTE — ED Provider Notes (Signed)
[facility] DE[facility] Provider Note   CSN: [id number] Arrival date & time: 05/24/16  1122     History   Chief Complaint Chief Complaint  Patient presents with  . Cough    HPI Marc Walker is a 45 y.o. male.  HPI  45 y.o. male with a hx of CAD, DM, HTN, HLD, presents to the Emergency Department today complaining of non productive cough x 3 weeks. Seen last week for same with negative CXR. States cough getting worse. No fevers. No congestion. No rhinorrhea. No CP/SOB/ABD pain. No N/V/D. Notes minimal relief with Mucinex. No other symptoms noted.   Past Medical History:  Diagnosis Date  . Coronary artery disease   . Diabetes mellitus without complication ([place])   . Heartburn   . High cholesterol   . Hyperlipidemia associated with type 2 diabetes mellitus ([place])   . Hypertension   . MI (myocardial infarction) 07/2009   [facility] in [place], [place] for last 2, 1st one in [place], [place]; total of 6 stents, last one 02/2014   . Thyroid disease    past history    Patient Active Problem List   Diagnosis Date Noted  . DM type 2 with diabetic peripheral neuropathy ([place]) 12/16/2015  . CAD (coronary artery disease) 09/30/2015  . Tobacco abuse 09/30/2015  . Essential hypertension 09/16/2015  . Diabetes mellitus without complication ([place])   . Hyperlipidemia associated with type 2 diabetes mellitus ([place])   . Hyperlipidemia   . Chest pain 09/15/2015  . MI (myocardial infarction) 07/30/2009    Past Surgical History:  Procedure Laterality Date  . APPENDECTOMY    . CARDIAC CATHETERIZATION N/A 09/16/2015   Procedure: Left Heart Cath and Coronary Angiography;  Surgeon: Lorretta Harp, MD;  Location: [facility];  Service: Cardiovascular;  Laterality: N/A;  . CARDIAC CATHETERIZATION N/A 09/16/2015   Procedure: Coronary Stent Intervention;  Surgeon: Lorretta Harp, MD;  Location: [facility];  Service: Cardiovascular;  Laterality: N/A;  . CORONARY STENT  PLACEMENT         Home Medications    Prior to Admission medications   Medication Sig Start Date End Date Taking? Authorizing Provider  amLODipine (NORVASC) 5 MG tablet Take 1 tablet (5 mg total) by mouth daily. 04/17/16 07/16/16  Liliane Shi, PA-C  aspirin 81 MG tablet Take 81 mg by mouth daily.    Historical Provider, MD  blood glucose meter kit and supplies KIT Dispense based on patient and insurance preference. Use up to four times daily as directed. (FOR ICD-9 250.00, 250.01). 09/17/15   Ripudeep Krystal Eaton, MD  clopidogrel (PLAVIX) 75 MG tablet Take 1 tablet (75 mg total) by mouth daily. 12/02/15   Fay Records, MD  gabapentin (NEURONTIN) 300 MG capsule Take 3 capsules (900 mg total) by mouth 3 (three) times daily. 03/23/16   Maren Reamer, MD  glucose blood (TRUE METRIX BLOOD GLUCOSE TEST) test strip Use as instructed 03/17/16   Maren Reamer, MD  hydrochlorothiazide (HYDRODIURIL) 25 MG tablet Take 1 tablet (25 mg total) by mouth daily. 12/16/15   Maren Reamer, MD  HYDROcodone-homatropine (HYCODAN) 5-1.5 MG/5ML syrup Take 5 mLs by mouth every 6 (six) hours as needed for cough. 05/16/16   Blanchie Dessert, MD  ibuprofen (ADVIL,MOTRIN) 800 MG tablet Take 1 tablet (800 mg total) by mouth 3 (three) times daily. 04/15/16   Maren Reamer, MD  insulin aspart (NOVOLOG) 100 UNIT/ML injection Inject 20 Units into  the skin 3 (three) times daily with meals. 04/15/16   Maren Reamer, MD  insulin glargine (LANTUS) 100 UNIT/ML injection Inject 0.35 mLs (35 Units total) into the skin 2 (two) times daily. 04/15/16   Tresa Garter, MD  Insulin Syringes, Disposable, U-100 0.5 ML MISC Take insulin as directed   Diagnosis: Insulin dependent diabetes mellitus ICD10 250 11/04/15   Maren Reamer, MD  lisinopril (PRINIVIL,ZESTRIL) 10 MG tablet Take 1 tablet (10 mg total) by mouth 2 (two) times daily. 09/30/15   Imogene Burn, PA-C  metFORMIN (GLUCOPHAGE-XR) 500 MG 24 hr tablet TAKE 2 TABLETS BY  MOUTH 2 TIMES DAILY WITH A MEAL. 05/01/16   Maren Reamer, MD  metoprolol (LOPRESSOR) 50 MG tablet Take 1.5 tablets (75 mg total) by mouth 2 (two) times daily. 03/24/16   Liliane Shi, PA-C  nicotine (NICOTROL) 10 MG inhaler Inhale 1 cartridge (1 continuous puffing total) into the lungs as needed for smoking cessation. 09/30/15   Imogene Burn, PA-C  nitroGLYCERIN (NITROSTAT) 0.4 MG SL tablet Place 1 tablet (0.4 mg total) under the tongue every 5 (five) minutes as needed for chest pain. 09/17/15   Ripudeep Krystal Eaton, MD  rosuvastatin (CRESTOR) 40 MG tablet Take 1 tablet (40 mg total) by mouth daily. 03/24/16 06/22/16  Liliane Shi, PA-C  TRUEPLUS LANCETS 28G MISC Use as directed 03/17/16   Maren Reamer, MD    Family History Family History  Problem Relation Age of Onset  . Adopted: Yes  . Diabetes Maternal Grandmother     Social History Social History  Substance Use Topics  . Smoking status: Current Every Day Smoker    Packs/day: 1.00    Years: 33.00    Types: Cigarettes  . Smokeless tobacco: Never Used  . Alcohol use No     Allergies   Other and Levofloxacin   Review of Systems Review of Systems  Constitutional: Negative for fever.  HENT: Positive for congestion. Negative for sinus pressure and sore throat.   Respiratory: Positive for cough. Negative for shortness of breath.   Cardiovascular: Negative for chest pain.  Gastrointestinal: Negative for diarrhea, nausea and vomiting.   Physical Exam Updated Vital Signs BP 150/84 (BP Location: Right Arm)   Pulse 73   Temp 97.7 F (36.5 C) (Oral)   Resp 24   Ht 5' 5.5" (1.664 m)   Wt 122.5 kg   SpO2 98%   BMI 44.25 kg/m   Physical Exam  Constitutional: He is oriented to person, place, and time. Vital signs are normal. He appears well-developed and well-nourished.  HENT:  Head: Normocephalic.  Right Ear: Hearing normal.  Left Ear: Hearing normal.  Eyes: Conjunctivae and EOM are normal. Pupils are equal, round, and  reactive to light.  Neck: Normal range of motion. Neck supple.  Cardiovascular: Normal rate, regular rhythm, normal heart sounds and intact distal pulses.   Pulmonary/Chest: Effort normal and breath sounds normal.  Musculoskeletal: Normal range of motion.  Neurological: He is alert and oriented to person, place, and time.  Skin: Skin is warm and dry.  Psychiatric: He has a normal mood and affect. His speech is normal and behavior is normal. Thought content normal.  Nursing note and vitals reviewed.  ED Treatments / Results  Labs (all labs ordered are listed, but only abnormal results are displayed) Labs Reviewed - No data to display  EKG  EKG Interpretation None       Radiology Dg Chest 2 View  Result Date: 05/24/2016 CLINICAL DATA:  Patient states he has a three week history of non productive cough. Was seen here for the same one week ago. Cough is getting worse. Hx MI, CAD, stents EXAM: CHEST  2 VIEW COMPARISON:  Chest x-rays dated 05/16/2016, 04/13/2016 and 11/26/2015. FINDINGS: Heart size and mediastinal contours are normal. Stable chronic pleural thickening noted at the left lung base. Lungs are otherwise clear. Lung volumes are normal. No pleural effusion or pneumothorax seen. Osseous structures about the chest are unremarkable. IMPRESSION: No active cardiopulmonary disease. No evidence of pneumonia or pulmonary edema. Electronically Signed   By: Franki Cabot M.D.   On: 05/24/2016 12:59    Procedures Procedures (including critical care time)  Medications Ordered in ED Medications - No data to display   Initial Impression / Assessment and Plan / ED Course  I have reviewed the triage vital signs and the nursing notes.  Pertinent labs & imaging results that were available during my care of the patient were reviewed by me and considered in my medical decision making (see chart for details).  Clinical Course    Final Clinical Impressions(s) / ED Diagnoses   {I have  reviewed and evaluated the relevant imaging studies.    {I obtained HPI from historian.   ED Course:  Assessment: Pt is a 45yM presents with URI symptoms x 3 weeks. Seen last week for same. Dx Bronchitis. No improvement since then. On exam, pt in NAD. VSS. Afebrile. Lungs CTA, Heart RRR. Abdomen nontender/soft. Pt CXR negative for acute infiltrate. Due to duration of symptoms will Rx ABX and Tessalon. Follow up to PCP. Verbalizes understanding and is agreeable with plan. Pt is hemodynamically stable & in NAD prior to dc.   Disposition/Plan:  DC Home Additional Verbal discharge instructions given and discussed with patient.  Pt Instructed to f/u with PCP in the next week for evaluation and treatment of symptoms. Return precautions given Pt acknowledges and agrees with plan  Supervising Physician Alfonzo Beers, MD  Final diagnoses:  Bronchitis    New Prescriptions New Prescriptions   No medications on file     Shary Decamp, PA-C 05/24/16 [place], MD 05/24/16 1313

## 2016-05-25 ENCOUNTER — Encounter: Payer: Self-pay | Admitting: Internal Medicine

## 2016-05-25 ENCOUNTER — Ambulatory Visit (INDEPENDENT_AMBULATORY_CARE_PROVIDER_SITE_OTHER): Payer: Self-pay | Admitting: Internal Medicine

## 2016-05-25 VITALS — BP 138/84 | HR 71 | Ht 65.5 in | Wt 287.8 lb

## 2016-05-25 DIAGNOSIS — R0600 Dyspnea, unspecified: Secondary | ICD-10-CM

## 2016-05-25 MED ORDER — POTASSIUM CHLORIDE ER 10 MEQ PO TBCR: 10.0000 meq | EXTENDED_RELEASE_TABLET | Freq: Every day | ORAL | 3 refills | Status: DC

## 2016-05-25 MED ORDER — FUROSEMIDE 40 MG PO TABS: 40.0000 mg | ORAL_TABLET | Freq: Every day | ORAL | 3 refills | Status: DC

## 2016-05-25 MED ORDER — POTASSIUM CHLORIDE ER 10 MEQ PO TBCR: 10.0000 meq | EXTENDED_RELEASE_TABLET | Freq: Every day | ORAL | 6 refills | Status: DC

## 2016-05-25 MED ORDER — FUROSEMIDE 40 MG PO TABS: 40.0000 mg | ORAL_TABLET | ORAL | 6 refills | Status: DC

## 2016-05-25 MED FILL — ?POTASSIUM CL ER 10 MEQ CAP: 10 MEQ | 30 days supply | Qty: 30 | Fill #0

## 2016-05-25 MED FILL — FUROSEMIDE 40 MG TABLET: 40 | 16 days supply | Qty: 12 | Fill #0

## 2016-05-25 MED FILL — AMLODIPINE BESYLATE 5 MG TA: 5 | 30 days supply | Qty: 30 | Fill #1

## 2016-05-25 NOTE — Patient Instructions (Signed)
Your physician has recommended you make the following change in your medication:  1.) lasix 40 mg--take one tablet every other day 2.) potassium 10 meq--take one tablet every other day  Your physician recommends that you schedule a follow-up appointment in: 3 weeks with Dr. Tenny Crawoss.

## 2016-05-29 ENCOUNTER — Telehealth: Payer: Self-pay | Admitting: Internal Medicine

## 2016-05-29 MED ORDER — AMLODIPINE BESYLATE 5 MG PO TABS: 5.0000 mg | ORAL_TABLET | Freq: Every day | ORAL | 6 refills | Status: DC

## 2016-05-29 MED FILL — CLOPIDOGREL 75 MG TABLET: 75 | 30 days supply | Qty: 30 | Fill #5

## 2016-05-29 MED FILL — !NOVOLOG 100UNITS/ML VIAL: 100/ML | 28 days supply | Qty: 20 | Fill #2

## 2016-05-29 MED FILL — TRUE METRIX TEST STRIP: 30 days supply | Qty: 100 | Fill #2

## 2016-05-29 MED FILL — HYDROCHLOROTHIAZIDE 25 MG T: 25 | 30 days supply | Qty: 30 | Fill #4

## 2016-05-29 MED FILL — ROSUVASTATIN CALCIUM 40 MG: 40 | 30 days supply | Qty: 30 | Fill #2

## 2016-05-29 MED FILL — $LANTUS 100 UNITS/ML VIAL: 100 | 28 days supply | Qty: 30 | Fill #2

## 2016-05-29 MED FILL — GABAPENTIN 300 MG CAPSULE: 300 | 20 days supply | Qty: 180 | Fill #2

## 2016-05-29 MED FILL — ?LISINOPRIL 10 MG TABLET: 10 | 30 days supply | Qty: 30 | Fill #2

## 2016-05-29 MED FILL — TRUEPLUS SYR 0.5ML 31GX5/16: 31G X 5/16" | 30 days supply | Qty: 100 | Fill #2

## 2016-05-29 MED FILL — TRUEplus LANCETS 28G MISC: 30 days supply | Qty: 100 | Fill #2

## 2016-05-29 MED FILL — METFORMIN HCL ER 500 MG TAB: 500 | 30 days supply | Qty: 120 | Fill #1

## 2016-05-29 NOTE — Telephone Encounter (Signed)
At OV this week patient did not have amlodipine listed on his medicine list and so he thought he was not taking.   Also recalled that it did not make a big difference in his BP readings.  He called because he in fact continues to take this medicine every day, and in looking at the BP logs he keeps it is helping his BP be lower. He continues to take amlodipine 5 mg as was previously listed on his medication list.   Added this medication back to his medication list.

## 2016-05-29 NOTE — Telephone Encounter (Signed)
New Message:    Pt says he needs to talk to you about his blood pressure medicine.

## 2016-06-01 ENCOUNTER — Other Ambulatory Visit: Payer: Self-pay | Admitting: *Deleted

## 2016-06-01 MED ORDER — INSULIN GLARGINE 100 UNIT/ML ~~LOC~~ SOLN: 35.0000 [IU] | Freq: Two times a day (BID) | SUBCUTANEOUS | 3 refills | Status: DC

## 2016-06-01 NOTE — Telephone Encounter (Signed)
PRINTED FOR PASS PROGRAM 

## 2016-06-15 ENCOUNTER — Encounter: Payer: Self-pay | Admitting: Internal Medicine

## 2016-06-15 ENCOUNTER — Ambulatory Visit (INDEPENDENT_AMBULATORY_CARE_PROVIDER_SITE_OTHER): Payer: Self-pay | Admitting: Internal Medicine

## 2016-06-15 ENCOUNTER — Encounter (INDEPENDENT_AMBULATORY_CARE_PROVIDER_SITE_OTHER): Payer: Self-pay

## 2016-06-15 VITALS — BP 124/84 | HR 90 | Ht 65.5 in | Wt 285.1 lb

## 2016-06-15 DIAGNOSIS — I10 Essential (primary) hypertension: Secondary | ICD-10-CM

## 2016-06-15 DIAGNOSIS — I2510 Atherosclerotic heart disease of native coronary artery without angina pectoris: Secondary | ICD-10-CM

## 2016-06-15 LAB — LIPID PANEL
Cholesterol: 191 mg/dL (ref <200)
HDL: 48 mg/dL (ref >40)
LDL CALC: 123 mg/dL — AB (ref <100)
Total CHOL/HDL Ratio: 4 Ratio (ref <5.0)
Triglycerides: 99 mg/dL (ref <150)
VLDL: 20 mg/dL (ref <30)

## 2016-06-15 LAB — BASIC METABOLIC PANEL
BUN: 16 mg/dL (ref 7–25)
CHLORIDE: 104 mmol/L (ref 98–110)
CO2: 22 mmol/L (ref 20–31)
CREATININE: 0.7 mg/dL (ref 0.60–1.35)
Calcium: 9.5 mg/dL (ref 8.6–10.3)
GLUCOSE: 186 mg/dL — AB (ref 65–99)
Potassium: 4.9 mmol/L (ref 3.5–5.3)
Sodium: 136 mmol/L (ref 135–146)

## 2016-06-15 LAB — HEPATIC FUNCTION PANEL
ALT: 15 U/L (ref 9–46)
AST: 17 U/L (ref 10–40)
Albumin: 3.8 g/dL (ref 3.6–5.1)
Alkaline Phosphatase: 62 U/L (ref 40–115)
BILIRUBIN DIRECT: 0.1 mg/dL (ref <=0.2)
BILIRUBIN INDIRECT: 0.2 mg/dL (ref 0.2–1.2)
TOTAL PROTEIN: 6.8 g/dL (ref 6.1–8.1)
Total Bilirubin: 0.3 mg/dL (ref 0.2–1.2)

## 2016-06-15 LAB — BRAIN NATRIURETIC PEPTIDE: BRAIN NATRIURETIC PEPTIDE: 23.3 pg/mL (ref <100)

## 2016-06-15 MED ORDER — METOPROLOL TARTRATE 50 MG PO TABS: 75.0000 mg | ORAL_TABLET | Freq: Two times a day (BID) | ORAL | 11 refills | Status: DC

## 2016-06-15 NOTE — Progress Notes (Signed)
 Cardiology Office Note   Date:  06/15/2016   ID:  Marc Walker, DOB 11/14/1970, MRN [id number]  PCP:  Dawn T Langeland, MD  Cardiologist:   Paula Ross, MD   F/U of  CAD     History of Present Illness: Marc Walker is a 45 y.o. male with a history of ofCAD s/pprior myocardial infarction, s/p multipleprior stents placedat outside institutions, DM, HTN, HL, tobacco abuse. He was admitted to [facility] in 3/17 with unstable angina. LHC demonstrated 80% mid RCA stenosis treated with DES, 90% proximal LAD and 70% mid LAD stenosis involving the previous stent treated with DES, 20% RPDA in-stent restenosis treated with angioplasty. The stent in OM2 was patent. Residual disease included 90% D2 stenosis and 70% mid LCx stenosis, treated medically.  He was seen by S Weaver in Sept 2017  with complaints of chest pain mainly occurring while standing at the sink in his bathroom shaving. He also noted exertional chest pain and dyspnea on exertion that was chronic and stable. His beta blocker and arranged and nuclear stress test. This demonstrated inferolateral and apical ischemia of mild severity. Overall, study was low risk. Plan to continue medical therapy.  Amlodipine started      At last visit I added lasix 40 every other day    Since I saw him he remains active at work  WIll have intermitt symtpoms of dyspnea that are erratic  WIll hustle without a problem and not long after will get SOB  Trying to watch diet  (salt)     Current Meds  Medication Sig  . amLODipine (NORVASC) 5 MG tablet Take 1 tablet (5 mg total) by mouth daily.  . aspirin 81 MG tablet Take 81 mg by mouth daily.  . blood glucose meter kit and supplies KIT Dispense based on patient and insurance preference. Use up to four times daily as directed. (FOR ICD-9 250.00, 250.01).  . clopidogrel (PLAVIX) 75 MG tablet Take 1 tablet (75 mg total) by mouth daily.  . furosemide (LASIX) 40 MG tablet Take 1 tablet (40  mg total) by mouth 3 (three) times a week.  . gabapentin (NEURONTIN) 300 MG capsule Take 3 capsules (900 mg total) by mouth 3 (three) times daily.  . glucose blood (TRUE METRIX BLOOD GLUCOSE TEST) test strip Use as instructed  . hydrochlorothiazide (HYDRODIURIL) 25 MG tablet Take 1 tablet (25 mg total) by mouth daily.  . insulin aspart (NOVOLOG) 100 UNIT/ML injection Inject 20 Units into the skin 3 (three) times daily with meals.  . insulin glargine (LANTUS) 100 UNIT/ML injection Inject 0.35 mLs (35 Units total) into the skin 2 (two) times daily.  . Insulin Syringes, Disposable, U-100 0.5 ML MISC Take insulin as directed   Diagnosis: Insulin dependent diabetes mellitus ICD10 250  . lisinopril (PRINIVIL,ZESTRIL) 10 MG tablet Take 1 tablet (10 mg total) by mouth 2 (two) times daily.  . metFORMIN (GLUCOPHAGE-XR) 500 MG 24 hr tablet TAKE 2 TABLETS BY MOUTH 2 TIMES DAILY WITH A MEAL.  . nicotine (NICOTROL) 10 MG inhaler Inhale 1 cartridge (1 continuous puffing total) into the lungs as needed for smoking cessation.  . nitroGLYCERIN (NITROSTAT) 0.4 MG SL tablet Place 1 tablet (0.4 mg total) under the tongue every 5 (five) minutes as needed for chest pain.  . potassium chloride (K-DUR) 10 MEQ tablet Take 1 tablet (10 mEq total) by mouth daily.  . rosuvastatin (CRESTOR) 40 MG tablet Take 1 tablet (40 mg total) by mouth daily.  .   TRUEPLUS LANCETS 28G MISC Use as directed     Allergies:   Other and Levofloxacin   Past Medical History:  Diagnosis Date  . Coronary artery disease   . Diabetes mellitus without complication ([facility])   . Heartburn   . High cholesterol   . Hyperlipidemia associated with type 2 diabetes mellitus ([facility])   . Hypertension   . MI (myocardial infarction) 07/2009   [facility] in [place], [place] for last 2, 1st one in [place], [place]; total of 6 stents, last one 02/2014   . Thyroid disease    past history    Past Surgical History:  Procedure Laterality Date  .  APPENDECTOMY    . CARDIAC CATHETERIZATION N/A 09/16/2015   Procedure: Left Heart Cath and Coronary Angiography;  Surgeon: Jonathan J Berry, MD;  Location: [facility];  Service: Cardiovascular;  Laterality: N/A;  . CARDIAC CATHETERIZATION N/A 09/16/2015   Procedure: Coronary Stent Intervention;  Surgeon: Jonathan J Berry, MD;  Location: [facility];  Service: Cardiovascular;  Laterality: N/A;  . CORONARY STENT PLACEMENT       Social History:  The patient  reports that he has been smoking Cigarettes.  He has a 33.00 pack-year smoking history. He has never used smokeless tobacco. He reports that he does not drink alcohol or use drugs.   Family History:  The patient's family history includes Diabetes in his maternal grandmother. He was adopted.    ROS:  Please see the history of present illness. All other systems are reviewed and  Negative to the above problem except as noted.    PHYSICAL EXAM: VS:  BP 124/84   Pulse 90   Ht 5' 5.5" (1.664 m)   Wt 285 lb 1.9 oz (129.3 kg)   SpO2 96%   BMI 46.72 kg/m   GEN: Well nourished, well developed, in no acute distress  HEENT: normal  Neck: no JVD, carotid bruits, or masses Cardiac: RRR; no murmurs, rubs, or gallops,no edema  Respiratory:  clear to auscultation bilaterally, normal work of breathing GI: soft, nontender, nondistended, + BS  No hepatomegaly  MS: no deformity Moving all extremities   Skin: warm and dry, no rash Neuro:  Strength and sensation are intact Psych: euthymic mood, full affect   EKG:  EKG is ordered today.   Lipid Panel    Component Value Date/Time   CHOL 147 03/23/2016 0744   TRIG 102 03/23/2016 0744   HDL 41 03/23/2016 0744   CHOLHDL 3.6 03/23/2016 0744   VLDL 20 03/23/2016 0744   LDLCALC 86 03/23/2016 0744      Wt Readings from Last 3 Encounters:  06/15/16 285 lb 1.9 oz (129.3 kg)  05/25/16 287 lb 12.8 oz (130.5 kg)  05/24/16 270 lb (122.5 kg)      ASSESSMENT AND PLAN: 1  CAD  Still  with intermitt SOB  Not always correl with activty  SOme days will get the shoulder discomfort that is concerning Again, erratic  ? Related to filling parameters    I would get labs today  (BMET, BNP)  Continue on curent regimen Add back metoprolol 75 bid  I am not sure why stopped  2  HL  Check lipids    May change lasxi  3  Morbid obesity  Has seen dietary in past  Needs to lose wt  [employer]    Current medicines are reviewed at length with the patient today.  The patient does not have   concerns regarding medicines.  Signed, Dorris Carnes, MD  06/15/2016 10:04 AM    [address], [place], [place]  [address] Phone: [phone]; Fax: [phone]

## 2016-06-15 NOTE — Patient Instructions (Signed)
Your physician recommends that you continue on your current medications as directed. Please refer to the Current Medication list given to you today.  Your physician recommends that you return for lab work today (BMET, BNP, LIPIDS, LIVER FUNCTION)  Your physician [facility] that you [facility]enny [facility]OSS.

## 2016-06-18 ENCOUNTER — Telehealth: Payer: Self-pay | Admitting: Internal Medicine

## 2016-06-18 DIAGNOSIS — E1169 Type 2 diabetes mellitus with other specified complication: Secondary | ICD-10-CM

## 2016-06-18 DIAGNOSIS — E785 Hyperlipidemia, unspecified: Principal | ICD-10-CM

## 2016-06-18 MED ORDER — EZETIMIBE 10 MG PO TABS
10.0000 mg | ORAL_TABLET | Freq: Every day | ORAL | 3 refills | Status: DC
Start: 2016-06-18 — End: 2016-09-01

## 2016-06-18 MED FILL — EZETIMIBE 10 MG TABLET: 10 | 30 days supply | Qty: 30 | Fill #0

## 2016-06-18 NOTE — Telephone Encounter (Signed)
Follow Up:; ° ° °Returning your call. °

## 2016-06-18 NOTE — Telephone Encounter (Signed)
Informed pt of lab results. Pt verbalized understanding. Pt unable to pick up medication until he gets paid next week. Labs scheduled for 08/24/16.

## 2016-06-23 ENCOUNTER — Emergency Department (HOSPITAL_COMMUNITY): Payer: Self-pay

## 2016-06-23 ENCOUNTER — Other Ambulatory Visit: Payer: Self-pay

## 2016-06-23 ENCOUNTER — Encounter (HOSPITAL_COMMUNITY): Payer: Self-pay | Admitting: Neurology

## 2016-06-23 ENCOUNTER — Emergency Department (HOSPITAL_COMMUNITY)
Admission: EM | Admit: 2016-06-23 | Discharge: 2016-06-23 | Disposition: A | Discharge status: Home / Self Care | Payer: Self-pay | Attending: Emergency Medicine | Admitting: Emergency Medicine

## 2016-06-23 DIAGNOSIS — Z794 Long term (current) use of insulin: Secondary | ICD-10-CM | POA: Insufficient documentation

## 2016-06-23 DIAGNOSIS — Z955 Presence of coronary angioplasty implant and graft: Secondary | ICD-10-CM | POA: Insufficient documentation

## 2016-06-23 DIAGNOSIS — J029 Acute pharyngitis, unspecified: Secondary | ICD-10-CM | POA: Insufficient documentation

## 2016-06-23 DIAGNOSIS — E119 Type 2 diabetes mellitus without complications: Secondary | ICD-10-CM | POA: Insufficient documentation

## 2016-06-23 DIAGNOSIS — I10 Essential (primary) hypertension: Secondary | ICD-10-CM | POA: Insufficient documentation

## 2016-06-23 DIAGNOSIS — Z7902 Long term (current) use of antithrombotics/antiplatelets: Secondary | ICD-10-CM | POA: Insufficient documentation

## 2016-06-23 DIAGNOSIS — R69 Illness, unspecified: Secondary | ICD-10-CM

## 2016-06-23 DIAGNOSIS — I252 Old myocardial infarction: Secondary | ICD-10-CM | POA: Insufficient documentation

## 2016-06-23 DIAGNOSIS — Z7982 Long term (current) use of aspirin: Secondary | ICD-10-CM | POA: Insufficient documentation

## 2016-06-23 DIAGNOSIS — J111 Influenza due to unidentified influenza virus with other respiratory manifestations: Secondary | ICD-10-CM

## 2016-06-23 DIAGNOSIS — I2510 Atherosclerotic heart disease of native coronary artery without angina pectoris: Secondary | ICD-10-CM | POA: Insufficient documentation

## 2016-06-23 DIAGNOSIS — F17210 Nicotine dependence, cigarettes, uncomplicated: Secondary | ICD-10-CM | POA: Insufficient documentation

## 2016-06-23 MED ORDER — BENZONATATE 100 MG PO CAPS: 100.0000 mg | ORAL_CAPSULE | Freq: Three times a day (TID) | ORAL | 0 refills | Status: DC

## 2016-06-23 MED ORDER — OXYMETAZOLINE HCL 0.05 % NA SOLN
1.0000 | Freq: Once | NASAL | Status: AC
  Administered 2016-06-23: 1 via NASAL
  Filled 2016-06-23: qty 15

## 2016-06-23 MED ORDER — ACETAMINOPHEN 325 MG PO TABS
650.0000 mg | ORAL_TABLET | Freq: Once | ORAL | Status: AC
  Administered 2016-06-23: 650 mg via ORAL
  Filled 2016-06-23: qty 2

## 2016-06-23 NOTE — ED Triage Notes (Signed)
Pt reports cough, runny nose for several days. 99.3 temp. Has coughed so much that his whole body hurts, even his teeth.

## 2016-06-23 NOTE — Discharge Instructions (Signed)
Rest, drink plenty of fluids, take ibuprofen and Tylenol for body aches and fever. Take Afrin twice a day, do not take for more than 3 days. Take Tessalon as prescribed as needed for cough. You can also try Mucinex or dayquil over-the-counter. Follow-up with family doctor for recheck in 2-3 days. Return if worsening symptoms.

## 2016-06-23 NOTE — ED Provider Notes (Signed)
[facility] DEPT Provider Note   CSN: [id number] Arrival date & time: 06/23/16  1014 By signing my name below, I, Dyke Brackett, attest that this documentation has been prepared under the direction and in the presence of non-physician practitioner, Jeannett Senior, PA-C. Electronically Signed: Dyke Brackett, Scribe. 06/23/2016. 10:53 AM.   History   Chief Complaint Chief Complaint  Patient presents with  . Cough    HPI Marc Walker is a 45 y.o. male with a hx of DM, CAD, MI, HTN, and HLD who presents to the Emergency Department complaining of intermittent, moderate cough onset last night. He notes associated sneezing, rhinorrhea, sore throat, generalized body aches and subjective fever. Pt also complains of left lower dental pain and states "my tooth is no good now". No medication taken PTA. Per pt, he has not taken his chronic medications because he's "too worn out".  No alleviating or modifying factors noted. He denies any n/v/d. No neck pain or stiffness. No other complaints.    The history is provided by the patient. No language interpreter was used.   Past Medical History:  Diagnosis Date  . Coronary artery disease   . Diabetes mellitus without complication ([facility])   . Heartburn   . High cholesterol   . Hyperlipidemia associated with type 2 diabetes mellitus ([place])   . Hypertension   . MI (myocardial infarction) 07/2009   [facility] in [place], [place] for last 2, 1st one in [place], [place]; total of 6 stents, last one 02/2014   . Thyroid disease    past history    Patient Active Problem List   Diagnosis Date Noted  . DM type 2 with diabetic peripheral neuropathy ([facility]) 12/16/2015  . CAD (coronary artery disease) 09/30/2015  . Tobacco abuse 09/30/2015  . Essential hypertension 09/16/2015  . Diabetes mellitus without complication ([place])   . Hyperlipidemia associated with type 2 diabetes mellitus ([facility])   . Hyperlipidemia   . Chest pain 09/15/2015  . MI  (myocardial infarction) 07/30/2009    Past Surgical History:  Procedure Laterality Date  . APPENDECTOMY    . CARDIAC CATHETERIZATION N/A 09/16/2015   Procedure: Left Heart Cath and Coronary Angiography;  Surgeon: Lorretta Harp, MD;  Location: [facility];  Service: Cardiovascular;  Laterality: N/A;  . CARDIAC CATHETERIZATION N/A 09/16/2015   Procedure: Coronary Stent Intervention;  Surgeon: Lorretta Harp, MD;  Location: [facility];  Service: Cardiovascular;  Laterality: N/A;  . CORONARY STENT PLACEMENT         Home Medications    Prior to Admission medications   Medication Sig Start Date End Date Taking? Authorizing Provider  amLODipine (NORVASC) 5 MG tablet Take 1 tablet (5 mg total) by mouth daily. 05/29/16 08/27/16  Fay Records, MD  aspirin 81 MG tablet Take 81 mg by mouth daily.    Historical Provider, MD  blood glucose meter kit and supplies KIT Dispense based on patient and insurance preference. Use up to four times daily as directed. (FOR ICD-9 250.00, 250.01). 09/17/15   Ripudeep Krystal Eaton, MD  clopidogrel (PLAVIX) 75 MG tablet Take 1 tablet (75 mg total) by mouth daily. 12/02/15   Fay Records, MD  ezetimibe (ZETIA) 10 MG tablet Take 1 tablet (10 mg total) by mouth daily. 06/18/16 09/16/16  Fay Records, MD  furosemide (LASIX) 40 MG tablet Take 1 tablet (40 mg total) by mouth 3 (three) times a week. 05/25/16 08/23/16  Fay Records, MD  gabapentin (  NEURONTIN) 300 MG capsule Take 3 capsules (900 mg total) by mouth 3 (three) times daily. 03/23/16   Maren Reamer, MD  glucose blood (TRUE METRIX BLOOD GLUCOSE TEST) test strip Use as instructed 03/17/16   Maren Reamer, MD  hydrochlorothiazide (HYDRODIURIL) 25 MG tablet Take 1 tablet (25 mg total) by mouth daily. 12/16/15   Maren Reamer, MD  insulin aspart (NOVOLOG) 100 UNIT/ML injection Inject 20 Units into the skin 3 (three) times daily with meals. 04/15/16   Maren Reamer, MD  insulin glargine (LANTUS) 100  UNIT/ML injection Inject 0.35 mLs (35 Units total) into the skin 2 (two) times daily. 06/01/16   Tresa Garter, MD  Insulin Syringes, Disposable, U-100 0.5 ML MISC Take insulin as directed   Diagnosis: Insulin dependent diabetes mellitus ICD10 250 11/04/15   Maren Reamer, MD  lisinopril (PRINIVIL,ZESTRIL) 10 MG tablet Take 1 tablet (10 mg total) by mouth 2 (two) times daily. 09/30/15   Imogene Burn, PA-C  metFORMIN (GLUCOPHAGE-XR) 500 MG 24 hr tablet TAKE 2 TABLETS BY MOUTH 2 TIMES DAILY WITH A MEAL. 05/01/16   Maren Reamer, MD  metoprolol (LOPRESSOR) 50 MG tablet Take 1.5 tablets (75 mg total) by mouth 2 (two) times daily. 06/15/16 09/13/16  Fay Records, MD  nicotine (NICOTROL) 10 MG inhaler Inhale 1 cartridge (1 continuous puffing total) into the lungs as needed for smoking cessation. 09/30/15   Imogene Burn, PA-C  nitroGLYCERIN (NITROSTAT) 0.4 MG SL tablet Place 1 tablet (0.4 mg total) under the tongue every 5 (five) minutes as needed for chest pain. 09/17/15   Ripudeep Krystal Eaton, MD  potassium chloride (K-DUR) 10 MEQ tablet Take 1 tablet (10 mEq total) by mouth daily. 05/25/16 08/23/16  Fay Records, MD  rosuvastatin (CRESTOR) 40 MG tablet Take 1 tablet (40 mg total) by mouth daily. 03/24/16 06/22/16  Liliane Shi, PA-C  TRUEPLUS LANCETS 28G MISC Use as directed 03/17/16   Maren Reamer, MD    Family History Family History  Problem Relation Age of Onset  . Adopted: Yes  . Diabetes Maternal Grandmother     Social History Social History  Substance Use Topics  . Smoking status: Current Every Day Smoker    Packs/day: 1.00    Years: 33.00    Types: Cigarettes  . Smokeless tobacco: Never Used  . Alcohol use No    Allergies   Other and Levofloxacin   Review of Systems Review of Systems  Constitutional: Positive for fever (subjective).  HENT: Positive for dental problem, rhinorrhea, sneezing and sore throat.   Eyes: Negative for photophobia.  Respiratory: Positive for  cough.   Gastrointestinal: Negative for abdominal pain, diarrhea, nausea and vomiting.  Genitourinary: Negative for dysuria.  Musculoskeletal: Positive for arthralgias and myalgias.  Skin: Negative for rash.  Neurological: Positive for headaches.  All other systems reviewed and are negative.   Physical Exam Updated Vital Signs BP 124/75 (BP Location: Left Arm)   Pulse 107   Temp 99.3 F (37.4 C) (Oral)   Resp 20   Ht 5' 5.5" (1.664 m)   SpO2 97%   Physical Exam  Constitutional: He is oriented to person, place, and time. He appears well-developed and well-nourished. No distress.  HENT:  Head: Normocephalic and atraumatic.  Right Ear: External ear normal.  Left Ear: External ear normal.  Mouth/Throat: Oropharynx is clear and moist.  Clear rhinorrhea, pharynx erythemous, uvula midline. Left lower second molar is loose to the  touch, no obvious gum infection.  Eyes: Conjunctivae are normal.  Neck: Normal range of motion. Neck supple.  No meningeal signs  Cardiovascular: Regular rhythm and normal heart sounds.   Tachycardic  Pulmonary/Chest: Effort normal and breath sounds normal. No respiratory distress. He has no wheezes. He has no rales.  Abdominal: Soft. Bowel sounds are normal. He exhibits no distension. There is no tenderness.  Musculoskeletal: He exhibits no edema or tenderness.  Lymphadenopathy:    He has no cervical adenopathy.  Neurological: He is alert and oriented to person, place, and time.  Skin: Skin is warm and dry. No erythema.  Psychiatric: He has a normal mood and affect.  Nursing note and vitals reviewed.  ED Treatments / Results  DIAGNOSTIC STUDIES:  Oxygen Saturation is 97% on RA, normal by my interpretation.    COORDINATION OF CARE:  10:51 AM Will order DG chest. Discussed treatment plan which includes afrin and tylenol with pt at bedside and pt agreed to plan.  Labs (all labs ordered are listed, but only abnormal results are displayed) Labs  Reviewed - No data to display  EKG  EKG Interpretation None       Radiology Dg Chest 2 View  Result Date: 06/23/2016 CLINICAL DATA:  Shortness of breath. EXAM: CHEST  2 VIEW COMPARISON:  05/24/2016 .  09/01/2015 06/11/2015. FINDINGS: Cardiomegaly. No pulmonary venous congestion. Mild interstitial prominence. Bilateral pleural thickening again noted consistent with scarring. No pleural effusion or pneumothorax . IMPRESSION: 1. Stable cardiomegaly.  No pulmonary venous congestion. 2. Mild bilateral from interstitial prominence. Mild pneumonitis cannot be excluded. Electronically Signed   By: Marcello Moores  Register   On: 06/23/2016 11:22    Procedures Procedures (including critical care time)  Medications Ordered in ED Medications - No data to display   Initial Impression / Assessment and Plan / ED Course  I have reviewed the triage vital signs and the nursing notes.  Pertinent labs & imaging results that were available during my care of the patient were reviewed by me and considered in my medical decision making (see chart for details).  Clinical Course    Patient in emergency department with upper respiratory symptoms, cough, generalized myalgias and body aches. No nausea or vomiting. No diarrhea. No chest pain. Exam is most consistent with a viral upper respiratory infection/illness. Chest x-ray was obtained and is negative other than possible pneumonitis. He was treated with Afrin for his congestion and Tylenol. He is mildly tachycardic, heart rate is 103 on my reevaluation, oxygen saturation is normal, he is afebrile. At this time stable for discharge home with symptomatic treatment. Instructed to follow with his doctor in 2 days for recheck. Return if worsening symptoms. Suspect viral syndrome versus influenza. Return precautions discussed.  Vitals:   06/23/16 1026  BP: 124/75  Pulse: 107  Resp: 20  Temp: 99.3 F (37.4 C)  TempSrc: Oral  SpO2: 97%  Height: 5' 5.5" (1.664 m)      Final Clinical Impressions(s) / ED Diagnoses   Final diagnoses:  Influenza-like illness    New Prescriptions Discharge Medication List as of 06/23/2016 12:00 PM    START taking these medications   Details  benzonatate (TESSALON) 100 MG capsule Take 1 capsule (100 mg total) by mouth every 8 (eight) hours., Starting Tue 06/23/2016, Print       I personally performed the services described in this documentation, which was scribed in my presence. The recorded information has been reviewed and is accurate.    Lahoma Rocker  Tyronica Truxillo, PA-C 06/23/16 [place], MD 06/23/16 [phone]

## 2016-06-25 ENCOUNTER — Telehealth: Payer: Self-pay | Admitting: Internal Medicine

## 2016-06-25 ENCOUNTER — Telehealth: Payer: Self-pay | Admitting: Cardiology

## 2016-06-25 MED FILL — ?POTASSIUM CL ER 10 MEQ CAP: 10 MEQ | 15 days supply | Qty: 15 | Fill #0

## 2016-06-25 MED FILL — !NOVOLOG 100UNITS/ML VIAL: 100/ML | 28 days supply | Qty: 20 | Fill #3

## 2016-06-25 MED FILL — CLOPIDOGREL 75 MG TABLET: 75 | 30 days supply | Qty: 30 | Fill #6

## 2016-06-25 MED FILL — GABAPENTIN 300 MG CAPSULE: 300 | 20 days supply | Qty: 180 | Fill #3

## 2016-06-25 MED FILL — METFORMIN HCL ER 500 MG TAB: 500 | 30 days supply | Qty: 120 | Fill #2

## 2016-06-25 MED FILL — ROSUVASTATIN CALCIUM 40 MG: 40 | 30 days supply | Qty: 30 | Fill #3

## 2016-06-25 MED FILL — ?FUROSEMIDE 40 MG TABLET: 40 | 30 days supply | Qty: 15 | Fill #0

## 2016-06-25 MED FILL — !LANTUS 100 UNITS/ML VIAL: 100 | 26 days supply | Qty: 30 | Fill #3

## 2016-06-25 MED FILL — ?LISINOPRIL 10 MG TABLET: 10 | 30 days supply | Qty: 30 | Fill #3

## 2016-06-25 MED FILL — HYDROCHLOROTHIAZIDE 25 MG T: 25 | 30 days supply | Qty: 30 | Fill #5

## 2016-06-25 NOTE — Telephone Encounter (Signed)
Pt calling requesting to speak to nurse  Pt states he is having two teeth pulled on Jan 29th and would like to discuss his medications that he is currently on and the interactions with the procedure

## 2016-06-25 NOTE — Telephone Encounter (Signed)
Close encounter 

## 2016-06-30 NOTE — Telephone Encounter (Signed)
Would there be any medications to react to procedure

## 2016-06-30 NOTE — Telephone Encounter (Signed)
Called pt, confirmed dob.  Has dental appt on 1/28 for tooth extractions. Cautioned him on his insulin rx day of if npo.  He typically runs very high though, so if >200, would recd he takes his lantus that am, and hold novolog and metformin due to npo status.   Asked him to make appt w/ me as well, have not seen since 9/17.

## 2016-07-01 MED FILL — TRUEPLUS SYR 0.5ML 31GX5/16: 31G X 5/16" | 30 days supply | Qty: 100 | Fill #3

## 2016-07-01 MED FILL — TRUEplus LANCETS 28G MISC: 30 days supply | Qty: 100 | Fill #3

## 2016-07-01 MED FILL — TRUE METRIX TEST STRIP: 30 days supply | Qty: 100 | Fill #3

## 2016-07-03 ENCOUNTER — Telehealth: Payer: Self-pay | Admitting: *Deleted

## 2016-07-03 NOTE — Telephone Encounter (Signed)
Called this patient that was referred by scott w.  as a new sleep to see what DME he works with. [facility] had a sleep study on him and faxed me a copy. He currently has no insurance and is willing to private pay for his suppplies on line.

## 2016-07-03 NOTE — Telephone Encounter (Signed)
Called this patient that was referred by scott w.  as a new sleep to see what dme he works with. [facility] was it and they had a sleep study on him and faxed me a copy. He currently has no insurance and can not get a new mask for his machine but he is willing to private pay for a new one on line. He has not used his machine in almost a year because the mask is broken. I mentioned that he may have to get another sleep study to get everything back in order with receiving his supplies and a machine that the dme can fix if broken or needs pressure changes on.  I mentioned the patient assistant program to him and he didn't think he could afford the $100  Fee at this time.

## 2016-07-05 NOTE — Progress Notes (Signed)
Cardiology Office Note    Date:  07/06/2016   ID:  Marc Walker, DOB 27-Jun-1971, MRN [id number]  PCP:  Maren Reamer, MD  Cardiologist:  Fransico Him, MD   Chief Complaint  Patient presents with  . Sleep Apnea  . Hypertension    History of Present Illness:  Marc Walker is a 46 y.o. male with a history of OSA on CPAP.  He has had his CPAP device for sometime but has never seen a sleep MD and orders all his supplies online.  His PSG was done in 2014 showing moderate OSA with an AHI of 21.8/hr and oxygen desaturations to 82%.  He underwent CPAP titration to 13cm H2O with resolution of respiratory events.  He also was noted to have moderately severe snoring.  His epworth sleepiness scale at the time was 24.  He is now here to establish care.  He tolerates his device without any problems. He admits to not always being compliant with his CPAP device but says that it definitely makes him feel better when he uses it.  He has not used his device in several months due to the mask being broke. He has used a nasal mask and a full face mask which he tolerates well but his full face mask broke and the nasal mask is not working well.  He feels rested in the am and has no daytime sleepiness when he uses the device. He gets his supplies on line because he does not have insurance.     Past Medical History:  Diagnosis Date  . Coronary artery disease   . Diabetes mellitus without complication ([facility])   . Heartburn   . High cholesterol   . Hyperlipidemia associated with type 2 diabetes mellitus ([facility])   . Hypertension   . MI (myocardial infarction) 07/2009   [facility] in [place], [place] for last 2, 1st one in [place], [place]; total of 6 stents, last one 02/2014   . Morbid obesity ([facility]) 07/06/2016  . OSA (obstructive sleep apnea) 07/06/2016   Moderate with AHI 21.8/hr by PSG 08/2012 now on CPAP  . Thyroid disease    past history    Past Surgical History:  Procedure Laterality Date  .  APPENDECTOMY    . CARDIAC CATHETERIZATION N/A 09/16/2015   Procedure: Left Heart Cath and Coronary Angiography;  Surgeon: Lorretta Harp, MD;  Location: [facility];  Service: Cardiovascular;  Laterality: N/A;  . CARDIAC CATHETERIZATION N/A 09/16/2015   Procedure: Coronary Stent Intervention;  Surgeon: Lorretta Harp, MD;  Location: [facility];  Service: Cardiovascular;  Laterality: N/A;  . CORONARY STENT PLACEMENT      Current Medications: Outpatient Medications Prior to Visit  Medication Sig Dispense Refill  . amLODipine (NORVASC) 5 MG tablet Take 1 tablet (5 mg total) by mouth daily. 30 tablet 6  . aspirin 81 MG tablet Take 81 mg by mouth daily.    . blood glucose meter kit and supplies KIT Dispense based on patient and insurance preference. Use up to four times daily as directed. (FOR ICD-9 250.00, 250.01). 1 each 0  . clopidogrel (PLAVIX) 75 MG tablet Take 1 tablet (75 mg total) by mouth daily. 30 tablet 10  . ezetimibe (ZETIA) 10 MG tablet Take 1 tablet (10 mg total) by mouth daily. 90 tablet 3  . furosemide (LASIX) 40 MG tablet Take 1 tablet (40 mg total) by mouth 3 (three) times a week. 15 tablet 6  .  gabapentin (NEURONTIN) 300 MG capsule Take 3 capsules (900 mg total) by mouth 3 (three) times daily. 180 capsule 3  . glucose blood (TRUE METRIX BLOOD GLUCOSE TEST) test strip Use as instructed 100 each 12  . hydrochlorothiazide (HYDRODIURIL) 25 MG tablet Take 1 tablet (25 mg total) by mouth daily. 90 tablet 3  . insulin aspart (NOVOLOG) 100 UNIT/ML injection Inject 20 Units into the skin 3 (three) times daily with meals. 30 vial 3  . insulin glargine (LANTUS) 100 UNIT/ML injection Inject 0.35 mLs (35 Units total) into the skin 2 (two) times daily. 30 mL 3  . Insulin Syringes, Disposable, U-100 0.5 ML MISC Take insulin as directed   Diagnosis: Insulin dependent diabetes mellitus ICD10 250 1 each 0  . lisinopril (PRINIVIL,ZESTRIL) 10 MG tablet Take 1 tablet (10 mg total)  by mouth 2 (two) times daily. 60 tablet 3  . metFORMIN (GLUCOPHAGE-XR) 500 MG 24 hr tablet TAKE 2 TABLETS BY MOUTH 2 TIMES DAILY WITH A MEAL. 120 tablet 2  . metoprolol (LOPRESSOR) 50 MG tablet Take 1.5 tablets (75 mg total) by mouth 2 (two) times daily. 90 tablet 11  . nicotine (NICOTROL) 10 MG inhaler Inhale 1 cartridge (1 continuous puffing total) into the lungs as needed for smoking cessation. 42 each 0  . nitroGLYCERIN (NITROSTAT) 0.4 MG SL tablet Place 1 tablet (0.4 mg total) under the tongue every 5 (five) minutes as needed for chest pain. 30 tablet 12  . potassium chloride (K-DUR) 10 MEQ tablet Take 1 tablet (10 mEq total) by mouth daily. 15 tablet 6  . rosuvastatin (CRESTOR) 40 MG tablet Take 1 tablet (40 mg total) by mouth daily. 90 tablet 3  . TRUEPLUS LANCETS 28G MISC Use as directed 100 each 12  . benzonatate (TESSALON) 100 MG capsule Take 1 capsule (100 mg total) by mouth every 8 (eight) hours. (Patient not taking: Reported on 07/06/2016) 21 capsule 0   No facility-administered medications prior to visit.      Allergies:   Other and Levofloxacin   Social History   Social History  . Marital status: Divorced    Spouse name: N/A  . Number of children: N/A  . Years of education: N/A   Occupational History  . Biochemist, clinical    Social History Main Topics  . Smoking status: Current Every Day Smoker    Packs/day: 1.00    Years: 33.00    Types: Cigarettes  . Smokeless tobacco: Never Used  . Alcohol use No  . Drug use: No  . Sexual activity: Not Asked   Other Topics Concern  . None   Social History Narrative   Adopted. Very little info on biological parents, ?diabetes.   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 He was adopted.   ROS:   Please see the history of present illness.    ROS All other systems reviewed and are negative.  No flowsheet data found.     PHYSICAL  EXAM:   VS:  BP (!) 142/88   Pulse 96   Ht 5' 5.5" (1.664 m)   Wt 281 lb 1.9 oz (127.5 kg)   SpO2 96%   BMI 46.07 kg/m    GEN: Well nourished, well developed, in no acute distress  HEENT: normal  Neck: no JVD, carotid bruits, or masses Cardiac: RRR; no murmurs, rubs, or gallops,no edema.  Intact distal pulses bilaterally.  Respiratory:  clear to auscultation bilaterally, normal work of breathing GI: soft, nontender, nondistended, + BS MS: no deformity or atrophy  Skin: warm and dry, no rash Neuro:  Alert and Oriented x 3, Strength and sensation are intact Psych: euthymic mood, full affect  Wt Readings from Last 3 Encounters:  07/06/16 281 lb 1.9 oz (127.5 kg)  06/15/16 285 lb 1.9 oz (129.3 kg)  05/25/16 287 lb 12.8 oz (130.5 kg)      Studies/Labs Reviewed:   EKG:  EKG is not ordered today.    Recent Labs: 09/17/2015: Hemoglobin 12.8; Platelets 177 06/15/2016: ALT 15; Brain Natriuretic Peptide 23.3; BUN 16; Creat 0.70; Potassium 4.9; Sodium 136   Lipid Panel    Component Value Date/Time   CHOL 191 06/15/2016 1037   TRIG 99 06/15/2016 1037   HDL 48 06/15/2016 1037   CHOLHDL 4.0 06/15/2016 1037   VLDL 20 06/15/2016 1037   LDLCALC 123 (H) 06/15/2016 1037    Additional studies/ records that were reviewed today include:  Sleep study at [facility]:    1. OSA (obstructive sleep apnea)   2. Essential hypertension   3. Morbid obesity ([facility])      PLAN:  In order of problems listed above:  OSA - the patient is tolerating PAP therapy well without any problems.  The patient has been using and benefiting from CPAP use and will continue to benefit from therapy. I have asked him to bring in his download card to review.  He would like to get a new full face mask. I will order a [employer] full face mask medium sized mask which is what he has had in the past.  I will get a download from his device in 2 months after he has started using his device again.    HTN - BP controlled on current meds. Continue amlodipine/ACE I and BB.   Morbid obesity - I have encouraged him to get into a routine exercise program and cut back on carbs and portions.      Medication Adjustments/Labs and Tests Ordered: Current medicines are reviewed at length with the patient today.  Concerns regarding medicines are outlined above.  Medication changes, Labs and Tests ordered today are listed in the Patient Instructions below.  There are no Patient Instructions on file for this visit.   Signed, Fransico Him, MD  07/06/2016 10:10 AM    [facility] [address], [place], [place]  [address] Phone: [phone]; Fax: [phone]

## 2016-07-06 ENCOUNTER — Encounter: Payer: Self-pay | Admitting: Internal Medicine

## 2016-07-06 ENCOUNTER — Other Ambulatory Visit: Payer: Self-pay

## 2016-07-06 ENCOUNTER — Ambulatory Visit: Payer: Self-pay | Attending: Internal Medicine | Admitting: Internal Medicine

## 2016-07-06 ENCOUNTER — Encounter: Payer: Self-pay | Admitting: Cardiology

## 2016-07-06 ENCOUNTER — Ambulatory Visit (INDEPENDENT_AMBULATORY_CARE_PROVIDER_SITE_OTHER): Payer: Self-pay | Admitting: Cardiology

## 2016-07-06 VITALS — BP 142/88 | HR 96 | Ht 65.5 in | Wt 281.1 lb

## 2016-07-06 VITALS — BP 134/107 | HR 93 | Temp 98.1°F | Resp 16 | Wt 281.4 lb

## 2016-07-06 DIAGNOSIS — I25119 Atherosclerotic heart disease of native coronary artery with unspecified angina pectoris: Secondary | ICD-10-CM

## 2016-07-06 DIAGNOSIS — G4733 Obstructive sleep apnea (adult) (pediatric): Secondary | ICD-10-CM

## 2016-07-06 DIAGNOSIS — E785 Hyperlipidemia, unspecified: Secondary | ICD-10-CM

## 2016-07-06 DIAGNOSIS — I2510 Atherosclerotic heart disease of native coronary artery without angina pectoris: Secondary | ICD-10-CM | POA: Insufficient documentation

## 2016-07-06 DIAGNOSIS — I10 Essential (primary) hypertension: Secondary | ICD-10-CM | POA: Insufficient documentation

## 2016-07-06 DIAGNOSIS — Z9989 Dependence on other enabling machines and devices: Secondary | ICD-10-CM

## 2016-07-06 DIAGNOSIS — Z794 Long term (current) use of insulin: Secondary | ICD-10-CM | POA: Insufficient documentation

## 2016-07-06 DIAGNOSIS — Z7982 Long term (current) use of aspirin: Secondary | ICD-10-CM | POA: Insufficient documentation

## 2016-07-06 DIAGNOSIS — F17210 Nicotine dependence, cigarettes, uncomplicated: Secondary | ICD-10-CM | POA: Insufficient documentation

## 2016-07-06 DIAGNOSIS — Z5189 Encounter for other specified aftercare: Secondary | ICD-10-CM | POA: Insufficient documentation

## 2016-07-06 DIAGNOSIS — E1142 Type 2 diabetes mellitus with diabetic polyneuropathy: Secondary | ICD-10-CM

## 2016-07-06 DIAGNOSIS — Z79899 Other long term (current) drug therapy: Secondary | ICD-10-CM | POA: Insufficient documentation

## 2016-07-06 DIAGNOSIS — E119 Type 2 diabetes mellitus without complications: Secondary | ICD-10-CM | POA: Insufficient documentation

## 2016-07-06 HISTORY — DX: Obstructive sleep apnea (adult) (pediatric): G47.33

## 2016-07-06 HISTORY — DX: Morbid (severe) obesity due to excess calories: E66.01

## 2016-07-06 LAB — POCT GLYCOSYLATED HEMOGLOBIN (HGB A1C): HEMOGLOBIN A1C: 7.7

## 2016-07-06 LAB — GLUCOSE, POCT (MANUAL RESULT ENTRY): POC GLUCOSE: 115 mg/dL — AB (ref 70–99)

## 2016-07-06 MED ORDER — INSULIN GLARGINE 100 UNIT/ML ~~LOC~~ SOLN: 40.0000 [IU] | Freq: Two times a day (BID) | SUBCUTANEOUS | 3 refills | Status: DC

## 2016-07-06 MED ORDER — HYDROCHLOROTHIAZIDE 25 MG PO TABS: 25.0000 mg | ORAL_TABLET | Freq: Every day | ORAL | 3 refills | Status: DC

## 2016-07-06 MED ORDER — ASPIRIN 81 MG PO TABS: 81.0000 mg | ORAL_TABLET | Freq: Every day | ORAL | 3 refills | Status: DC

## 2016-07-06 MED ORDER — ROSUVASTATIN CALCIUM 40 MG PO TABS: 40.0000 mg | ORAL_TABLET | Freq: Every day | ORAL | 3 refills | Status: DC

## 2016-07-06 MED ORDER — INSULIN ASPART 100 UNIT/ML ~~LOC~~ SOLN: 20.0000 [IU] | Freq: Three times a day (TID) | SUBCUTANEOUS | 3 refills | Status: DC

## 2016-07-06 MED ORDER — GABAPENTIN 300 MG PO CAPS: 900.0000 mg | ORAL_CAPSULE | Freq: Three times a day (TID) | ORAL | 3 refills | Status: DC

## 2016-07-06 MED FILL — GABAPENTIN 300 MG CAPSULE: 300 | 20 days supply | Qty: 180 | Fill #0

## 2016-07-06 NOTE — Patient Instructions (Signed)
Medication Instructions:  Your physician recommends that you continue on your current medications as directed. Please refer to the Current Medication list given to you today.   Labwork: None  Testing/Procedures: None  Follow-Up: Your physician wants you to follow-up in: 6 months with Dr. Mayford Knifeurner. You will receive a reminder letter in the mail two months in advance. If you don't receive a letter, please call our office to schedule the follow-up appointment.   Any Other Special Instructions Will Be Listed Below (If Applicable). You have been given a prescription for a new face mask.   We will call you in a couple months to get a download to see how you are doing.  Coralee Northina is our office CPAP assistant. Her direct number is [phone] if you have any questions or concerns prior to your next appointment.    If you need a refill on your cardiac medications before your next appointment, please call your pharmacy.

## 2016-07-06 NOTE — Patient Instructions (Signed)
QUICK START PATIENT GUIDE TO LCHF/IF LOW CARB HIGH FAT / INTERMITTENT FASTING  Recommend: <50 gram carbohydrate a day for weightloss.  What is this diet and how does it work? o Insulin is a hormone made by your body that allows you to use sugar (glucose) from carbohydrates in the food you eat for energy or to store glucose (as fat) for future use  o Insulin levels need to be lowered in order to utilize our stored energy (fat) o Many struggling with obesity are insulin resistant and have high levels of insulin o This diet works to lower your insulin in two ways o Fasting - allows your insulin levels to naturally decrease  o Avoiding carbohydrates - carbs trigger increase in insulin Low Carb Healthy Fat (LCHF) o Get a free app for your phone, such as MyFitnessPal, to help you track your macronutrients (carbs/protein/fats) and to track your weight and body measurements to see your progress o Set your goal for around 10% carbs/20% protein/70% fat o A good starting goal for amount of net carbs per day is 50 grams (some will aim for 20 grams) o "Net carbs" refers to total grams of carbs minus grams of fiber (as fiber is not typically absorbed). For example, if a food has 5g total carb and 3g fiber, that would be 2g net carbs o Increase healthy fats - eg. olive oil, eggs, nuts, avocado, cheese, butter, coconut, meats, fish o Avoid high carb foods - eg. bread, pasta, potatoes, rice, cookies, soda, juice, anything sugary o Buy full-fat ingredients (avoid low-fat versions, which often have more sugar) o No need to count calories, but pay close attention to grams of carbs on labels Intermittent Fasting (IF) o "Fasting" is going a period of time without eating - it helps to stay busy and well-hydrated o Purpose of fasting is to allow insulin levels to drop as low as possible, allowing your body to switch into fat-burning mode o With this diet there are many approaches to fasting, but 16:8 and 24hr fasts  are commonly used o 16:8 fast, usually 5-7 days a week - Fasting for 16 hours of the day, then eating all meals for the day over course of 8 hours. o 24 hour fast, usually 1-3 days a week - Typically eating one meal a day, then fasting until the next day. Plenty of fluids (and some salt to help you hold onto fluids) are recommended during longer fasts.  o During fasts certain beverages are still acceptable - water, sparkling water, bone broth, black tea or coffee, or tea/coffee with small amount of heavy whipping cream Special note for those on diabetic medications o Discuss your medications with your physician. You may need to hold your medication or adjust to only taking when eating. Diabetics should keep close track of their blood sugars when making any changes to diet/meds, to ensure they are staying within normal limits For more info about LCHF/IF o Watch "Therapeutic Fasting - Solving the Two-Compartment Problem" video by Dr. Jason Fung on YouTube (https://www.youtube.com/watch?v=tIuj-oMN-Fk) for a great intro to these concepts o Read "The Obesity Code" and/or "The Complete Guide to Fasting" by Dr. Jason Fung o Go to www.dietdoctor.com for explanations, recipes, and infographics about foods to eat/avoid o Get a Free smartphone app that helps count carbohydrates  - ie MyKeto EXAMPLES TO GET STARTED Fasting Beverages -water (can add  tsp Pink Himalayan salt once or twice a day to help stay hydrated for longer fasts) -Sparkling water (such as   [place] or similar; avoid any with artificial sweeteners)  -Bone broth (multiple recipes available online or can buy pre-made) -Tea or Coffee (Adding heavy whipping cream or coconut oil to your tea or coffee can be helpful if you find yourself getting too hungry during the fasts. Can also add cinnamon for flavor. Or "bulletproof coffee.") Low Carb Healthy Fat Breakfast (if not fasting) -eggs in butter or olive oil with avocado -omelet with veggies and  cheese  Lunch -hamburger with cheese and avocado wrapped in lettuce (no bun, no ketchup) -meat and cheese wrapped in lettuce (can dip in mustard or olive oil/vinegar/mayo) -salad with meat/cheese/nuts and higher fat dressing (vinaigrette or Ranch, etc) -tuna salad lettuce wrap -taco meat with cheese, sour cream, guacamole, cheese over lettuce  Dinner -steak with herb butter or Barnaise sauce -"Fathead" pizza (uses cheese and almond flour for the dough - several recipes available online) -roasted or grilled chicken with skin on, with low carb sauce (buffalo, garlic butter, alfredo, pesto, etc) -baked salmon with lemon butter -chicken alfredo with zucchini noodles -Indian butter chicken with low carb garlic naan -egg roll in a bowl  Side Dishes -mashed cauliflower (homemade or available in freezer section) -roast vegetables (green veggies that grow above ground rather than root veggies) with butter or cheese -Caprese salad (fresh mozzarella, tomato and basil with olive oil) -homemade low-carb coleslaw Snacks/Desserts (try to avoid unnecessary snacking and sweets in general) -celery or cucumber dipped in guacamole or sour cream dip -cheese and meat slices  -raspberries with whipped cream (can make homemade with no sugar added) -low carb [place] butter cake  AVOID - sugar, diet/regular soda, potatoes, breads, rice, pasta, candy, cookies, cakes, muffins, juice, high carb fruit (bananas, grapes), beer, ketchup, barbeque and other sweet sauces 

## 2016-07-06 NOTE — Progress Notes (Signed)
Marc Walker, is a 46 y.o. male  ZJQ:[id number]  [id number]  DOB - 11-21-1970  Chief Complaint  Patient presents with  . Diabetes  . Hypertension        Subjective:   Marc Walker is a 46 y.o. male here today for a follow up visit dm, htn, hld, cad, osa (not using cpap since facemask has been broken about 3 wks), morbid obesity and tob abuse.  Still smoking, trying to smoke less. Watching his sugars more, and actually taking all meds as prescribed.  Currently on lasix per cards as well.  Patient has No headache, No chest pain, No abdominal pain - No Nausea, No new weakness tingling or numbness, No Cough - SOB.  No problems updated.  ALLERGIES: Allergies  Allergen Reactions  . 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  Pt only wants pain medicine if absolutely necessary.  . Levofloxacin Rash    Rash on back (not sun exposed)and hypersensitivity to skin on exposed skin/arm    PAST MEDICAL HISTORY: Past Medical History:  Diagnosis Date  . Coronary artery disease   . Diabetes mellitus without complication ([place])   . Heartburn   . High cholesterol   . Hyperlipidemia associated with type 2 diabetes mellitus ([place])   . Hypertension   . MI (myocardial infarction) 07/2009   [facility] in [place], [place] for last 2, 1st one in [place], [place]; total of 6 stents, last one 02/2014   . Morbid obesity ([place]) 07/06/2016  . OSA (obstructive sleep apnea) 07/06/2016   Moderate with AHI 21.8/hr by [facility] 08/2012 now on CPAP  . Thyroid disease    past history    MEDICATIONS AT HOME: Prior to Admission medications   Medication Sig Start Date End Date Taking? Authorizing Provider  amLODipine (NORVASC) 5 MG tablet Take 1 tablet (5 mg total) by mouth daily. 05/29/16 08/27/16  Fay Records, MD  aspirin 81 MG tablet Take 1 tablet (81 mg total) by mouth daily. 07/06/16   Maren Reamer, MD  blood glucose meter kit and supplies KIT Dispense based on patient and  insurance preference. Use up to four times daily as directed. (FOR ICD-9 250.00, 250.01). 09/17/15   Ripudeep Krystal Eaton, MD  clopidogrel (PLAVIX) 75 MG tablet Take 1 tablet (75 mg total) by mouth daily. 12/02/15   Fay Records, MD  ezetimibe (ZETIA) 10 MG tablet Take 1 tablet (10 mg total) by mouth daily. 06/18/16 09/16/16  Fay Records, MD  furosemide (LASIX) 40 MG tablet Take 1 tablet (40 mg total) by mouth 3 (three) times a week. 05/25/16 08/23/16  Fay Records, MD  gabapentin (NEURONTIN) 300 MG capsule Take 3 capsules (900 mg total) by mouth 3 (three) times daily. 07/06/16   Maren Reamer, MD  glucose blood (TRUE METRIX BLOOD GLUCOSE TEST) test strip Use as instructed 03/17/16   Maren Reamer, MD  hydrochlorothiazide (HYDRODIURIL) 25 MG tablet Take 1 tablet (25 mg total) by mouth daily. 07/06/16   Maren Reamer, MD  insulin aspart (NOVOLOG) 100 UNIT/ML injection Inject 20 Units into the skin 3 (three) times daily with meals. 07/06/16   Maren Reamer, MD  insulin glargine (LANTUS) 100 UNIT/ML injection Inject 0.4 mLs (40 Units total) into the skin 2 (two) times daily. 07/06/16   Maren Reamer, MD  Insulin Syringes, Disposable, U-100 0.5 ML MISC Take insulin as directed   Diagnosis: Insulin  dependent diabetes mellitus ICD10 250 11/04/15   Maren Reamer, MD  lisinopril (PRINIVIL,ZESTRIL) 10 MG tablet Take 1 tablet (10 mg total) by mouth 2 (two) times daily. 09/30/15   Imogene Burn, PA-C  metFORMIN (GLUCOPHAGE-XR) 500 MG 24 hr tablet TAKE 2 TABLETS BY MOUTH 2 TIMES DAILY WITH A MEAL. 05/01/16   Maren Reamer, MD  metoprolol (LOPRESSOR) 50 MG tablet Take 1.5 tablets (75 mg total) by mouth 2 (two) times daily. 06/15/16 09/13/16  Fay Records, MD  nicotine (NICOTROL) 10 MG inhaler Inhale 1 cartridge (1 continuous puffing total) into the lungs as needed for smoking cessation. 09/30/15   Imogene Burn, PA-C  nitroGLYCERIN (NITROSTAT) 0.4 MG SL tablet Place 1 tablet (0.4 mg total) under the tongue every 5  (five) minutes as needed for chest pain. 09/17/15   Ripudeep Krystal Eaton, MD  potassium chloride (K-DUR) 10 MEQ tablet Take 1 tablet (10 mEq total) by mouth daily. 05/25/16 08/23/16  Fay Records, MD  rosuvastatin (CRESTOR) 40 MG tablet Take 1 tablet (40 mg total) by mouth daily. 07/06/16 10/04/16  Maren Reamer, MD  TRUEPLUS LANCETS 28G MISC Use as directed 03/17/16   Maren Reamer, MD     Objective:   Vitals:   07/06/16 1556  BP: (!) 134/107  Pulse: 93  Resp: 16  Temp: 98.1 F (36.7 C)  TempSrc: Oral  SpO2: 97%  Weight: 281 lb 6.4 oz (127.6 kg)   05/24/16 270 lbs 05/25/16 287lbs  Exam General appearance : Awake, alert, not in any distress. Speech Clear. Not toxic looking, morbid obese HEENT: Atraumatic and Normocephalic, pupils equally reactive to light. Neck: supple, no JVD.   Chest:Good air entry bilaterally, no added sounds. CVS: S1 S2 regular, no murmurs/gallups or rubs. Abdomen: Bowel sounds active, abd obesity, Non tender and not distended with no gaurding, rigidity or rebound. Extremities: B/L Lower Ext shows no edema, both legs are warm to touch Neurology: Awake alert, and oriented X 3, CN II-XII grossly intact, Non focal Skin:No Rash  Data Review Lab Results  Component Value Date   HGBA1C 7.7 07/06/2016   HGBA1C 8.1 03/23/2016   HGBA1C 9.3 11/28/2015    Depression screen PHQ 2/9 07/06/2016 03/23/2016 03/23/2016 11/28/2015  Decreased Interest 0 0 0 0  Down, Depressed, Hopeless 0 0 0 0  PHQ - 2 Score 0 0 0 0  Altered sleeping - - - 3  Tired, decreased energy - - - 3  Change in appetite - - - 3  Feeling bad or failure about yourself  - - - 0  Trouble concentrating - - - 0  Moving slowly or fidgety/restless - - - 1  Suicidal thoughts - - - 0  PHQ-9 Score - - - 10  Difficult doing work/chores - - - Somewhat difficult      Assessment & Plan   1. OSA (obstructive sleep apnea) Has rx for med cpap mask per Dr Radford Pax, advised gets it filled asap.   2. Coronary  artery disease involving native coronary artery of native heart without angina pectoris No cp currently, defer to cards., no overt signs of fluid overload today. - asa 81 and plavix 75 - rosuvastatin (CRESTOR) 40 MG tablet; Take 1 tablet (40 mg total) by mouth daily.  Dispense: 90 tablet; Refill: 3   3. DM type 2 with diabetic peripheral neuropathy (HCC) (hand numbness bilat) Slowly doing better, still not optimal, but he is taking his meds now. Per pt, typically  on lantus 30bid, unless his cbg >150s, than he goes up to 40bid. - POCT glucose (manual entry) - POCT glycosylated hemoglobin (Hb A1C) 7.7 (8.1 in 9/25) - Microalbumin/Creatinine Ratio, Urine - advised him that if he really does do the Low carb diet to lose weight, will need to watch his cbgs carefully b/c he may have to reduce his insulin due to less needs/lower carbs. - congratulated him on his improvement w/ dm control, keep it up!  4. Morbid obesity ([place]) He has been to see dietitian, but weight continues to go up. Lengthy discussion re; low carb diet to help lose weight, carb counting w/ goals <50gm carbs a day recd. Advised him that if he does do the lc/hf/if diet, than he will need to watch his sugars closely, b/c he may notice his insulin needs will decrease  5. Hyperlipidemia, unspecified hyperlipidemia type - rosuvastatin (CRESTOR) 40 MG tablet; Take 1 tablet (40 mg total) by mouth daily.  Dispense: 90 tablet; Refill: 3  6. tob abuse  - total cessation advised, tips discussed.    Patient have been counseled extensively about nutrition and exercise  Return in about 3 months (around 10/04/2016), or if symptoms worsen or fail to improve.  The patient was given clear instructions to go to ER or return to medical center if symptoms don't improve, worsen or new problems develop. The patient verbalized understanding. The patient was told to call to get lab results if they haven't heard anything in the next week.   This note  has been created with Surveyor, quantity. Any transcriptional errors are unintentional.   Maren Reamer, MD, [facility] [place], [place]   07/06/2016, 4:30 PM

## 2016-07-07 LAB — MICROALBUMIN / CREATININE URINE RATIO
CREATININE, URINE: 84 mg/dL (ref 20–370)
MICROALB UR: 16.3 mg/dL
Microalb Creat Ratio: 194 mcg/mg creat — ABNORMAL HIGH (ref <30)

## 2016-07-07 MED FILL — ?POTASSIUM CL ER 10 MEQ CAP: 10 MEQ | 15 days supply | Qty: 15 | Fill #1

## 2016-07-07 MED FILL — !NOVOLOG 100UNITS/ML VIAL: 100/ML | 33 days supply | Qty: 20 | Fill #0

## 2016-07-08 ENCOUNTER — Telehealth: Payer: Self-pay | Admitting: Internal Medicine

## 2016-07-08 NOTE — Telephone Encounter (Signed)
Patient states he took his blood pressure today and read 98/69...he is not sure if this is normal..  He would like to speak to

## 2016-07-08 NOTE — Telephone Encounter (Signed)
Pt called with concerns that his BP is too low: 98/69 did not remember his pulse.  He does not feel dizzy, mentation is normal, pt able to communicate with writer without confusion in conversation. Pt encouraged to take blood pressure prior to taking next dose of medication.  He states he is out of  metoprolol, will have to pick this up tomorrow.   At what systolic number and diastolic number would you want patient to hold dose?

## 2016-07-08 NOTE — Telephone Encounter (Signed)
Will forward to pcp

## 2016-07-08 NOTE — Telephone Encounter (Signed)
Pt informed of message.

## 2016-07-08 NOTE — Telephone Encounter (Signed)
Pt should hold his metoprolol if sbp <95, hr<55.  He needs to call his cards if bp remains low, they recently changed his bp meds. thanks

## 2016-07-09 ENCOUNTER — Observation Stay (HOSPITAL_COMMUNITY)
Admission: EM | Admit: 2016-07-09 | Discharge: 2016-07-11 | Disposition: A | Discharge status: Home / Self Care | Payer: Self-pay | Attending: Family Medicine | Admitting: Family Medicine

## 2016-07-09 ENCOUNTER — Emergency Department (HOSPITAL_COMMUNITY): Payer: Self-pay

## 2016-07-09 ENCOUNTER — Telehealth: Payer: Self-pay | Admitting: Internal Medicine

## 2016-07-09 ENCOUNTER — Encounter (HOSPITAL_COMMUNITY): Payer: Self-pay

## 2016-07-09 DIAGNOSIS — I25118 Atherosclerotic heart disease of native coronary artery with other forms of angina pectoris: Secondary | ICD-10-CM

## 2016-07-09 DIAGNOSIS — Z881 Allergy status to other antibiotic agents status: Secondary | ICD-10-CM | POA: Insufficient documentation

## 2016-07-09 DIAGNOSIS — R109 Unspecified abdominal pain: Secondary | ICD-10-CM | POA: Insufficient documentation

## 2016-07-09 DIAGNOSIS — Z7902 Long term (current) use of antithrombotics/antiplatelets: Secondary | ICD-10-CM | POA: Insufficient documentation

## 2016-07-09 DIAGNOSIS — E785 Hyperlipidemia, unspecified: Secondary | ICD-10-CM | POA: Insufficient documentation

## 2016-07-09 DIAGNOSIS — R0789 Other chest pain: Principal | ICD-10-CM | POA: Insufficient documentation

## 2016-07-09 DIAGNOSIS — Z794 Long term (current) use of insulin: Secondary | ICD-10-CM | POA: Insufficient documentation

## 2016-07-09 DIAGNOSIS — I252 Old myocardial infarction: Secondary | ICD-10-CM | POA: Insufficient documentation

## 2016-07-09 DIAGNOSIS — Z7982 Long term (current) use of aspirin: Secondary | ICD-10-CM | POA: Insufficient documentation

## 2016-07-09 DIAGNOSIS — R7989 Other specified abnormal findings of blood chemistry: Secondary | ICD-10-CM | POA: Insufficient documentation

## 2016-07-09 DIAGNOSIS — R0602 Shortness of breath: Secondary | ICD-10-CM | POA: Insufficient documentation

## 2016-07-09 DIAGNOSIS — E7800 Pure hypercholesterolemia, unspecified: Secondary | ICD-10-CM | POA: Insufficient documentation

## 2016-07-09 DIAGNOSIS — G4733 Obstructive sleep apnea (adult) (pediatric): Secondary | ICD-10-CM | POA: Insufficient documentation

## 2016-07-09 DIAGNOSIS — R05 Cough: Secondary | ICD-10-CM | POA: Insufficient documentation

## 2016-07-09 DIAGNOSIS — R091 Pleurisy: Secondary | ICD-10-CM

## 2016-07-09 DIAGNOSIS — F17210 Nicotine dependence, cigarettes, uncomplicated: Secondary | ICD-10-CM | POA: Insufficient documentation

## 2016-07-09 DIAGNOSIS — R6510 Systemic inflammatory response syndrome (SIRS) of non-infectious origin without acute organ dysfunction: Secondary | ICD-10-CM | POA: Insufficient documentation

## 2016-07-09 DIAGNOSIS — Z79899 Other long term (current) drug therapy: Secondary | ICD-10-CM | POA: Insufficient documentation

## 2016-07-09 DIAGNOSIS — R778 Other specified abnormalities of plasma proteins: Secondary | ICD-10-CM | POA: Diagnosis present

## 2016-07-09 DIAGNOSIS — E1169 Type 2 diabetes mellitus with other specified complication: Secondary | ICD-10-CM | POA: Diagnosis present

## 2016-07-09 DIAGNOSIS — Z6841 Body Mass Index (BMI) 40.0 and over, adult: Secondary | ICD-10-CM | POA: Insufficient documentation

## 2016-07-09 DIAGNOSIS — I119 Hypertensive heart disease without heart failure: Secondary | ICD-10-CM | POA: Insufficient documentation

## 2016-07-09 DIAGNOSIS — I10 Essential (primary) hypertension: Secondary | ICD-10-CM | POA: Diagnosis present

## 2016-07-09 DIAGNOSIS — D72829 Elevated white blood cell count, unspecified: Secondary | ICD-10-CM | POA: Insufficient documentation

## 2016-07-09 DIAGNOSIS — E1142 Type 2 diabetes mellitus with diabetic polyneuropathy: Secondary | ICD-10-CM | POA: Insufficient documentation

## 2016-07-09 DIAGNOSIS — Z9989 Dependence on other enabling machines and devices: Secondary | ICD-10-CM

## 2016-07-09 DIAGNOSIS — R079 Chest pain, unspecified: Secondary | ICD-10-CM | POA: Diagnosis present

## 2016-07-09 DIAGNOSIS — I2510 Atherosclerotic heart disease of native coronary artery without angina pectoris: Secondary | ICD-10-CM | POA: Insufficient documentation

## 2016-07-09 DIAGNOSIS — M7989 Other specified soft tissue disorders: Secondary | ICD-10-CM | POA: Insufficient documentation

## 2016-07-09 DIAGNOSIS — Z955 Presence of coronary angioplasty implant and graft: Secondary | ICD-10-CM | POA: Insufficient documentation

## 2016-07-09 LAB — PROTIME-INR
INR: 0.93
PROTHROMBIN TIME: 12.4 s (ref 11.4–15.2)

## 2016-07-09 LAB — BASIC METABOLIC PANEL
Anion gap: 13 (ref 5–15)
BUN: 16 mg/dL (ref 6–20)
CHLORIDE: 103 mmol/L (ref 101–111)
CO2: 24 mmol/L (ref 22–32)
Calcium: 8.9 mg/dL (ref 8.9–10.3)
Creatinine, Ser: 1.03 mg/dL (ref 0.61–1.24)
GFR calc non Af Amer: >60 mL/min (ref >60)
GLUCOSE: 168 mg/dL — AB (ref 65–99)
Potassium: 3.9 mmol/L (ref 3.5–5.1)
Sodium: 140 mmol/L (ref 135–145)

## 2016-07-09 LAB — I-STAT TROPONIN, ED: Troponin i, poc: 0.45 ng/mL (ref 0.00–0.08)

## 2016-07-09 LAB — CBC
HCT: 40.9 % (ref 39.0–52.0)
Hemoglobin: 14.1 g/dL (ref 13.0–17.0)
MCH: 30.7 pg (ref 26.0–34.0)
MCHC: 34.5 g/dL (ref 30.0–36.0)
MCV: 88.9 fL (ref 78.0–100.0)
PLATELETS: 240 10*3/uL (ref 150–400)
RBC: 4.6 MIL/uL (ref 4.22–5.81)
RDW: 13.4 % (ref 11.5–15.5)
WBC: 15.1 10*3/uL — ABNORMAL HIGH (ref 4.0–10.5)

## 2016-07-09 MED ORDER — ACETAMINOPHEN 500 MG PO TABS
1000.0000 mg | ORAL_TABLET | Freq: Once | ORAL | Status: AC
  Administered 2016-07-10: 1000 mg via ORAL
  Filled 2016-07-09: qty 2

## 2016-07-09 MED ORDER — IOPAMIDOL (ISOVUE-370) INJECTION 76%
INTRAVENOUS | Status: AC
  Administered 2016-07-10: 100 mL
  Filled 2016-07-09: qty 100

## 2016-07-09 NOTE — Telephone Encounter (Signed)
Patient called in and said that he had had severe chest pain, said he's had it to some degree since having the flu a couple of weeks ago.  He was in [place] and [facility].  He was evaluated.  Troponin elevated at .08.  Eval suggests possible pulmonary embolism.  Patient left the hospital against medical advice.  He said that he wants to be closer to home.  Says he isn't having SOB, chest pain improved.  I advised that [facility].  He is by himself.  Advised that if he gets dizzy or feels any imparement to get off of the road and call 911. [facility] and advised that he is on the way.

## 2016-07-09 NOTE — ED Provider Notes (Signed)
[facility] DEPT Provider Note   CSN: [id number] Arrival date & time: 07/09/16  2209  By signing my name below, I, Marc Walker, attest that this documentation has been prepared under the direction and in the presence of Jola Schmidt, MD  Electronically Signed: Delton Walker, ED Scribe. 07/09/16. 11:55 PM.   History   Chief Complaint Chief Complaint  Patient presents with  . Abnormal Lab  . Rib Pain    The history is provided by the patient. No language interpreter was used.   HPI Comments:  Marc Walker is a 46 y.o. male, with a hx of DM, CAD, hyperlipidemia, HTN, MI in 02/11 and a PSHx of a cardiac catheterization on 09/16/15, who presents to the Emergency Department complaining of sudden onset, persistent, left lateral chest pain x several weeks (since a few days after 06/22/16). He describes the pain a sharp sensation. His pain is worse with deep breathing. Pt also reports a cough and leg swelling. No alleviating factors noted. Pt denies abdominal pain, nausea, vomiting, a hx of blood clots in his legs or lungs and any recent injuries/falls. He notes his last cardiac stents were placed about 11 months ago. Pt's troponin at [facility] was 0.075.   Cardiologist: Dr. Dorris Carnes.    Past Medical History:  Diagnosis Date  . Coronary artery disease   . Diabetes mellitus without complication ([place])   . Heartburn   . High cholesterol   . Hyperlipidemia associated with type 2 diabetes mellitus ([place])   . Hypertension   . MI (myocardial infarction) 07/2009   [facility] in [place], [place] for last 2, 1st one in [place], [place]; total of 6 stents, last one 02/2014   . Morbid obesity ([place]) 07/06/2016  . OSA (obstructive sleep apnea) 07/06/2016   Moderate with AHI 21.8/hr by PSG 08/2012 now on CPAP  . Thyroid disease    past history    Patient Active Problem List   Diagnosis Date Noted  . OSA (obstructive sleep apnea) 07/06/2016  . Morbid obesity ([place]) 07/06/2016  . DM type 2  with diabetic peripheral neuropathy ([place]) 12/16/2015  . CAD (coronary artery disease) 09/30/2015  . Tobacco abuse 09/30/2015  . Essential hypertension 09/16/2015  . Diabetes mellitus without complication ([place])   . Hyperlipidemia associated with type 2 diabetes mellitus ([place])   . Hyperlipidemia   . Chest pain 09/15/2015  . MI (myocardial infarction) 07/30/2009    Past Surgical History:  Procedure Laterality Date  . APPENDECTOMY    . CARDIAC CATHETERIZATION N/A 09/16/2015   Procedure: Left Heart Cath and Coronary Angiography;  Surgeon: Lorretta Harp, MD;  Location: [facility];  Service: Cardiovascular;  Laterality: N/A;  . CARDIAC CATHETERIZATION N/A 09/16/2015   Procedure: Coronary Stent Intervention;  Surgeon: Lorretta Harp, MD;  Location: [facility];  Service: Cardiovascular;  Laterality: N/A;  . CORONARY STENT PLACEMENT         Home Medications    Prior to Admission medications   Medication Sig Start Date End Date Taking? Authorizing Provider  amLODipine (NORVASC) 5 MG tablet Take 1 tablet (5 mg total) by mouth daily. 05/29/16 08/27/16  Fay Records, MD  aspirin 81 MG tablet Take 1 tablet (81 mg total) by mouth daily. 07/06/16   Maren Reamer, MD  blood glucose meter kit and supplies KIT Dispense based on patient and insurance preference. Use up to four times daily as directed. (FOR ICD-9 250.00, 250.01). 09/17/15   Ripudeep  Krystal Eaton, MD  clopidogrel (PLAVIX) 75 MG tablet Take 1 tablet (75 mg total) by mouth daily. 12/02/15   Fay Records, MD  ezetimibe (ZETIA) 10 MG tablet Take 1 tablet (10 mg total) by mouth daily. 06/18/16 09/16/16  Fay Records, MD  furosemide (LASIX) 40 MG tablet Take 1 tablet (40 mg total) by mouth 3 (three) times a week. 05/25/16 08/23/16  Fay Records, MD  gabapentin (NEURONTIN) 300 MG capsule Take 3 capsules (900 mg total) by mouth 3 (three) times daily. 07/06/16   Maren Reamer, MD  glucose blood (TRUE METRIX BLOOD GLUCOSE TEST) test strip  Use as instructed 03/17/16   Maren Reamer, MD  hydrochlorothiazide (HYDRODIURIL) 25 MG tablet Take 1 tablet (25 mg total) by mouth daily. 07/06/16   Maren Reamer, MD  insulin aspart (NOVOLOG) 100 UNIT/ML injection Inject 20 Units into the skin 3 (three) times daily with meals. 07/06/16   Maren Reamer, MD  insulin glargine (LANTUS) 100 UNIT/ML injection Inject 0.4 mLs (40 Units total) into the skin 2 (two) times daily. 07/06/16   Maren Reamer, MD  Insulin Syringes, Disposable, U-100 0.5 ML MISC Take insulin as directed   Diagnosis: Insulin dependent diabetes mellitus ICD10 250 11/04/15   Maren Reamer, MD  lisinopril (PRINIVIL,ZESTRIL) 10 MG tablet Take 1 tablet (10 mg total) by mouth 2 (two) times daily. 09/30/15   Imogene Burn, PA-C  metFORMIN (GLUCOPHAGE-XR) 500 MG 24 hr tablet TAKE 2 TABLETS BY MOUTH 2 TIMES DAILY WITH A MEAL. 05/01/16   Maren Reamer, MD  metoprolol (LOPRESSOR) 50 MG tablet Take 1.5 tablets (75 mg total) by mouth 2 (two) times daily. 06/15/16 09/13/16  Fay Records, MD  nicotine (NICOTROL) 10 MG inhaler Inhale 1 cartridge (1 continuous puffing total) into the lungs as needed for smoking cessation. 09/30/15   Imogene Burn, PA-C  nitroGLYCERIN (NITROSTAT) 0.4 MG SL tablet Place 1 tablet (0.4 mg total) under the tongue every 5 (five) minutes as needed for chest pain. 09/17/15   Ripudeep Krystal Eaton, MD  potassium chloride (K-DUR) 10 MEQ tablet Take 1 tablet (10 mEq total) by mouth daily. 05/25/16 08/23/16  Fay Records, MD  rosuvastatin (CRESTOR) 40 MG tablet Take 1 tablet (40 mg total) by mouth daily. 07/06/16 10/04/16  Maren Reamer, MD  TRUEPLUS LANCETS 28G MISC Use as directed 03/17/16   Maren Reamer, MD    Family History Family History  Problem Relation Age of Onset  . Adopted: Yes  . Diabetes Maternal Grandmother     Social History Social History  Substance Use Topics  . Smoking status: Current Every Day Smoker    Packs/day: 1.00    Years: 33.00    Types:  Cigarettes  . Smokeless tobacco: Never Used  . Alcohol use No     Allergies   Other and Levofloxacin   Review of Systems Review of Systems 10 systems reviewed and all are negative for acute change except as noted in the HPI.  Physical Exam Updated Vital Signs BP 126/65 (BP Location: Right Arm)   Pulse 63   Temp 98.3 F (36.8 C) (Oral)   Resp 20   Ht _0  (1.651 m)   Wt 281 lb 9 oz (127.7 kg)   SpO2 94%   BMI 46.85 kg/m   Physical Exam  Constitutional: He is oriented to person, place, and time. He appears well-developed and well-nourished.  HENT:  Head: Normocephalic and atraumatic.  Eyes: EOM are normal.  Neck: Normal range of motion.  Cardiovascular: Normal rate, regular rhythm, normal heart sounds and intact distal pulses.   Pulmonary/Chest: Effort normal and breath sounds normal. No respiratory distress.  Abdominal: Soft. He exhibits no distension. There is no tenderness.  Musculoskeletal: Normal range of motion.  Neurological: He is alert and oriented to person, place, and time.  Skin: Skin is warm and dry.  Psychiatric: He has a normal mood and affect. Judgment normal.  Nursing note and vitals reviewed.    ED Treatments / Results  DIAGNOSTIC STUDIES:  Oxygen Saturation is 96% on RA, normal by my interpretation.    COORDINATION OF CARE:  11:51 PM Discussed treatment plan with pt at bedside and pt agreed to plan.  Labs (all labs ordered are listed, but only abnormal results are displayed) Labs Reviewed  BASIC METABOLIC PANEL - Abnormal; Notable for the following:       Result Value   Glucose, Bld 168 (*)    All other components within normal limits  CBC - Abnormal; Notable for the following:    WBC 15.1 (*)    All other components within normal limits  I-STAT TROPOININ, ED - Abnormal; Notable for the following:    Troponin i, poc 0.45 (*)    All other components within normal limits  PROTIME-INR  BRAIN NATRIURETIC PEPTIDE    EKG  EKG  Interpretation  Date/Time:  Thursday July 09 2016 22:38:18 EST Ventricular Rate:  97 PR Interval:  132 QRS Duration: 84 QT Interval:  352 QTC Calculation: 447 R Axis:   72 Text Interpretation:  Normal sinus rhythm Normal ECG mild flattening of inferior lateral T wave changes as compared to ecg Sept 2017 Confirmed by Venora Maples  MD, Lennette Bihari ([id number]) on 07/09/2016 11:17:16 PM       Radiology Dg Chest 2 View  Result Date: 07/09/2016 CLINICAL DATA:  Left rib pain with general chest pain tonight. Persistent cough since 06/23/2016. Shortness of breath. History of diabetes and hypertension. Current smoker. EXAM: CHEST  2 VIEW COMPARISON:  06/23/2016 FINDINGS: Mild interstitial changes in the lung bases suggesting fibrosis. No change since prior study. No focal airspace disease or consolidation in the lungs. No blunting of costophrenic angles. No pneumothorax. Normal heart size and pulmonary vascularity. Mediastinal contours appear intact. Degenerative changes in the spine. IMPRESSION: Slight fibrosis in the lung bases. No evidence of active pulmonary disease. Electronically Signed   By: Lucienne Capers M.D.   On: 07/09/2016 23:09    Procedures Procedures (including critical care time)  Medications Ordered in ED Medications  acetaminophen (TYLENOL) tablet 1,000 mg (not administered)  iopamidol (ISOVUE-370) 76 % injection (not administered)     Initial Impression / Assessment and Plan / ED Course  I have reviewed the triage vital signs and the nursing notes.  Pertinent labs & imaging results that were available during my care of the patient were reviewed by me and considered in my medical decision making (see chart for details).  Clinical Course   Atypical chest pain with both a pleuritic component and the component that is somewhat tender to palpation on the left side.  CTA negative for PE.  The patient does however have a severe history of coronary artery disease status post stenting 6.  He  has a rising troponin was 0.07 at the outside hospital and currently in the ER is 0.45.  I'll ask cardiology come and evaluate the patient is a believe the patient will benefit from at  least observational admission overnight for serial troponins.  He did have an abnormal stress test this fall which demonstrated new inferior lateral akinesis.  He does have new inferior lateral T wave flattening.  Final Clinical Impressions(s) / ED Diagnoses   Final diagnoses:  None    New Prescriptions New Prescriptions   No medications on file   I personally performed the services described in this documentation, which was scribed in my presence. The recorded information has been reviewed and is accurate.        Jola Schmidt, MD 07/10/16 [id number]

## 2016-07-09 NOTE — ED Notes (Signed)
Notified nurse first results from [facility]at troponin. 

## 2016-07-09 NOTE — ED Notes (Signed)
Pt further revealed that [facility] was [facility] and they wanted to "keep me because they wanted to rule out blood clot in my lungs. They wanted to admit me to run a cat scan" but left AMA to come here because he is familiar with this hospital and his doctors are here. He also reports they told him his troponin was elevated.

## 2016-07-09 NOTE — ED Triage Notes (Addendum)
Pt reports left side pain, he thinks it could be his rib cage from cough so much. He states it is painful to take a deep breath. Pain started around 12/26. Denies urinary problems.

## 2016-07-09 NOTE — ED Notes (Signed)
Trop[facility] at [facility] I resulted at 0.075. Dr. Clayborne DanaMesner aware. No new orders at this time. Nurse first Beryle QuantKim F, RN also made aware.

## 2016-07-09 NOTE — Telephone Encounter (Signed)
New message    Pt verbalized that he wants rn to call him he has troponin elevation

## 2016-07-10 ENCOUNTER — Other Ambulatory Visit: Payer: Self-pay

## 2016-07-10 ENCOUNTER — Emergency Department (HOSPITAL_COMMUNITY): Payer: Self-pay

## 2016-07-10 ENCOUNTER — Observation Stay (HOSPITAL_BASED_OUTPATIENT_CLINIC_OR_DEPARTMENT_OTHER): Payer: Self-pay

## 2016-07-10 DIAGNOSIS — R7989 Other specified abnormal findings of blood chemistry: Secondary | ICD-10-CM

## 2016-07-10 DIAGNOSIS — R778 Other specified abnormalities of plasma proteins: Secondary | ICD-10-CM | POA: Diagnosis present

## 2016-07-10 DIAGNOSIS — R079 Chest pain, unspecified: Secondary | ICD-10-CM

## 2016-07-10 DIAGNOSIS — R6510 Systemic inflammatory response syndrome (SIRS) of non-infectious origin without acute organ dysfunction: Secondary | ICD-10-CM | POA: Diagnosis present

## 2016-07-10 DIAGNOSIS — R0782 Intercostal pain: Secondary | ICD-10-CM

## 2016-07-10 DIAGNOSIS — R748 Abnormal levels of other serum enzymes: Secondary | ICD-10-CM

## 2016-07-10 DIAGNOSIS — R109 Unspecified abdominal pain: Secondary | ICD-10-CM | POA: Diagnosis present

## 2016-07-10 DIAGNOSIS — I2510 Atherosclerotic heart disease of native coronary artery without angina pectoris: Secondary | ICD-10-CM

## 2016-07-10 DIAGNOSIS — E119 Type 2 diabetes mellitus without complications: Secondary | ICD-10-CM

## 2016-07-10 DIAGNOSIS — D72829 Elevated white blood cell count, unspecified: Secondary | ICD-10-CM | POA: Diagnosis present

## 2016-07-10 DIAGNOSIS — R091 Pleurisy: Secondary | ICD-10-CM | POA: Insufficient documentation

## 2016-07-10 LAB — LIPID PANEL
CHOL/HDL RATIO: 2.9 ratio
Cholesterol: 102 mg/dL (ref 0–200)
HDL: 35 mg/dL — AB (ref >40)
LDL Cholesterol: 49 mg/dL (ref 0–99)
Triglycerides: 91 mg/dL (ref <150)
VLDL: 18 mg/dL (ref 0–40)

## 2016-07-10 LAB — ECHOCARDIOGRAM COMPLETE
CHL CUP MV DEC (S): 169
E/e' ratio: 7.65
EWDT: 169 ms
FS: 24 % — AB (ref 28–44)
Height: 66 in
IV/PV OW: 0.94
LA diam end sys: 40 mm
LADIAMINDEX: 1.72 cm/m2
LASIZE: 40 mm
LAVOL: 53.9 mL
LAVOLA4C: 64.8 mL
LAVOLIN: 23.2 mL/m2
LDCA: 3.46 cm2
LV E/e'average: 7.65
LVEEMED: 7.65
LVELAT: 12.5 cm/s
LVOT SV: 95 mL
LVOT VTI: 27.5 cm
LVOT diameter: 21 mm
LVOTPV: 111 cm/s
Lateral S' vel: 11.1 cm/s
MV pk E vel: 95.6 m/s
MVPG: 4 mmHg
MVPKAVEL: 58.7 m/s
PW: 14 mm — AB (ref 0.6–1.1)
RV TAPSE: 20.7 mm
TDI e' lateral: 12.5
TDI e' medial: 6.85
Weight: 4523.2 oz

## 2016-07-10 LAB — URINALYSIS, ROUTINE W REFLEX MICROSCOPIC
Bilirubin Urine: NEGATIVE
Glucose, UA: NEGATIVE mg/dL
HGB URINE DIPSTICK: NEGATIVE
Ketones, ur: NEGATIVE mg/dL
LEUKOCYTES UA: NEGATIVE
Nitrite: NEGATIVE
Protein, ur: 30 mg/dL — AB
SPECIFIC GRAVITY, URINE: 1.042 — AB (ref 1.005–1.030)
pH: 5 (ref 5.0–8.0)

## 2016-07-10 LAB — CBC WITH DIFFERENTIAL/PLATELET
BASOS PCT: 1 %
Basophils Absolute: 0.1 10*3/uL (ref 0.0–0.1)
EOS ABS: 0.6 10*3/uL (ref 0.0–0.7)
Eosinophils Relative: 4 %
HCT: 40.9 % (ref 39.0–52.0)
HEMOGLOBIN: 13.9 g/dL (ref 13.0–17.0)
Lymphocytes Relative: 23 %
Lymphs Abs: 3.4 10*3/uL (ref 0.7–4.0)
MCH: 30.2 pg (ref 26.0–34.0)
MCHC: 34 g/dL (ref 30.0–36.0)
MCV: 88.9 fL (ref 78.0–100.0)
Monocytes Absolute: 1.1 10*3/uL — ABNORMAL HIGH (ref 0.1–1.0)
Monocytes Relative: 7 %
NEUTROS PCT: 65 %
Neutro Abs: 9.6 10*3/uL — ABNORMAL HIGH (ref 1.7–7.7)
Platelets: 240 10*3/uL (ref 150–400)
RBC: 4.6 MIL/uL (ref 4.22–5.81)
RDW: 13.5 % (ref 11.5–15.5)
WBC: 14.7 10*3/uL — AB (ref 4.0–10.5)

## 2016-07-10 LAB — RAPID URINE DRUG SCREEN, HOSP PERFORMED
Amphetamines: NOT DETECTED
BARBITURATES: NOT DETECTED
Benzodiazepines: NOT DETECTED
Cocaine: NOT DETECTED
Opiates: NOT DETECTED
TETRAHYDROCANNABINOL: NOT DETECTED

## 2016-07-10 LAB — GLUCOSE, CAPILLARY
GLUCOSE-CAPILLARY: 124 mg/dL — AB (ref 65–99)
GLUCOSE-CAPILLARY: 120 mg/dL — AB (ref 65–99)
GLUCOSE-CAPILLARY: 211 mg/dL — AB (ref 65–99)
GLUCOSE-CAPILLARY: 250 mg/dL — AB (ref 65–99)
Glucose-Capillary: 185 mg/dL — ABNORMAL HIGH (ref 65–99)

## 2016-07-10 LAB — TROPONIN I
Troponin I: 0.53 ng/mL (ref <0.03)
Troponin I: 0.4 ng/mL (ref <0.03)
Troponin I: 0.3 ng/mL (ref <0.03)

## 2016-07-10 LAB — BRAIN NATRIURETIC PEPTIDE: B NATRIURETIC PEPTIDE 5: 71.4 pg/mL (ref 0.0–100.0)

## 2016-07-10 LAB — LACTIC ACID, PLASMA: LACTIC ACID, VENOUS: 1.4 mmol/L (ref 0.5–1.9)

## 2016-07-10 MED ORDER — LISINOPRIL 10 MG PO TABS
10.0000 mg | ORAL_TABLET | Freq: Two times a day (BID) | ORAL | Status: DC
  Administered 2016-07-10 – 2016-07-11 (×2): 10 mg via ORAL
  Filled 2016-07-10 (×2): qty 1

## 2016-07-10 MED ORDER — HYDROCHLOROTHIAZIDE 25 MG PO TABS
25.0000 mg | ORAL_TABLET | Freq: Every day | ORAL | Status: DC
  Administered 2016-07-10 – 2016-07-11 (×2): 25 mg via ORAL
  Filled 2016-07-10 (×2): qty 1

## 2016-07-10 MED ORDER — NICOTINE 10 MG IN INHA: 1.0000 | RESPIRATORY_TRACT | Status: DC | PRN

## 2016-07-10 MED ORDER — AMLODIPINE BESYLATE 5 MG PO TABS
5.0000 mg | ORAL_TABLET | Freq: Every day | ORAL | Status: DC
  Administered 2016-07-11: 5 mg via ORAL
  Filled 2016-07-10: qty 1

## 2016-07-10 MED ORDER — SODIUM CHLORIDE 0.9 % IV SOLN
INTRAVENOUS | Status: DC
  Administered 2016-07-10: 05:00:00 via INTRAVENOUS

## 2016-07-10 MED ORDER — PANTOPRAZOLE SODIUM 40 MG PO TBEC
40.0000 mg | DELAYED_RELEASE_TABLET | Freq: Every day | ORAL | Status: DC
  Administered 2016-07-10 – 2016-07-11 (×2): 40 mg via ORAL
  Filled 2016-07-10 (×2): qty 1

## 2016-07-10 MED ORDER — ACETAMINOPHEN 325 MG PO TABS
650.0000 mg | ORAL_TABLET | ORAL | Status: DC | PRN
  Administered 2016-07-10: 650 mg via ORAL
  Filled 2016-07-10: qty 2

## 2016-07-10 MED ORDER — ASPIRIN 81 MG PO CHEW
81.0000 mg | CHEWABLE_TABLET | Freq: Every day | ORAL | Status: DC
  Administered 2016-07-10 – 2016-07-11 (×2): 81 mg via ORAL
  Filled 2016-07-10 (×2): qty 1

## 2016-07-10 MED ORDER — ONDANSETRON HCL 4 MG/2ML IJ SOLN: 4.0000 mg | Freq: Four times a day (QID) | INTRAMUSCULAR | Status: DC | PRN

## 2016-07-10 MED ORDER — NITROGLYCERIN 0.4 MG SL SUBL: 0.4000 mg | SUBLINGUAL_TABLET | SUBLINGUAL | Status: DC | PRN

## 2016-07-10 MED ORDER — ROSUVASTATIN CALCIUM 40 MG PO TABS
40.0000 mg | ORAL_TABLET | Freq: Every day | ORAL | Status: DC
  Administered 2016-07-10: 40 mg via ORAL
  Filled 2016-07-10: qty 1

## 2016-07-10 MED ORDER — DM-GUAIFENESIN ER 30-600 MG PO TB12: 1.0000 | ORAL_TABLET | Freq: Two times a day (BID) | ORAL | Status: DC | PRN

## 2016-07-10 MED ORDER — INSULIN ASPART 100 UNIT/ML ~~LOC~~ SOLN
0.0000 [IU] | Freq: Three times a day (TID) | SUBCUTANEOUS | Status: DC
  Administered 2016-07-10: 3 [IU] via SUBCUTANEOUS
  Administered 2016-07-10 – 2016-07-11 (×2): 2 [IU] via SUBCUTANEOUS
  Administered 2016-07-11: 3 [IU] via SUBCUTANEOUS

## 2016-07-10 MED ORDER — METOPROLOL TARTRATE 50 MG PO TABS
75.0000 mg | ORAL_TABLET | Freq: Two times a day (BID) | ORAL | Status: DC
  Administered 2016-07-10 – 2016-07-11 (×4): 75 mg via ORAL
  Filled 2016-07-10 (×4): qty 1

## 2016-07-10 MED ORDER — INSULIN GLARGINE 100 UNIT/ML ~~LOC~~ SOLN
28.0000 [IU] | Freq: Two times a day (BID) | SUBCUTANEOUS | Status: DC
  Administered 2016-07-10 – 2016-07-11 (×2): 28 [IU] via SUBCUTANEOUS
  Filled 2016-07-10 (×7): qty 0.28

## 2016-07-10 MED ORDER — ZOLPIDEM TARTRATE 5 MG PO TABS: 5.0000 mg | ORAL_TABLET | Freq: Every evening | ORAL | Status: DC | PRN

## 2016-07-10 MED ORDER — IBUPROFEN 200 MG PO TABS
400.0000 mg | ORAL_TABLET | Freq: Three times a day (TID) | ORAL | Status: DC
  Administered 2016-07-10 – 2016-07-11 (×3): 400 mg via ORAL
  Filled 2016-07-10 (×3): qty 2

## 2016-07-10 MED ORDER — FUROSEMIDE 40 MG PO TABS: 40.0000 mg | ORAL_TABLET | ORAL | Status: DC

## 2016-07-10 MED ORDER — ENOXAPARIN SODIUM 40 MG/0.4ML ~~LOC~~ SOLN
40.0000 mg | SUBCUTANEOUS | Status: DC
  Administered 2016-07-10 – 2016-07-11 (×2): 40 mg via SUBCUTANEOUS
  Filled 2016-07-10: qty 0.4

## 2016-07-10 MED ORDER — CLOPIDOGREL BISULFATE 75 MG PO TABS
75.0000 mg | ORAL_TABLET | Freq: Every day | ORAL | Status: DC
  Administered 2016-07-10 – 2016-07-11 (×2): 75 mg via ORAL
  Filled 2016-07-10 (×2): qty 1

## 2016-07-10 MED ORDER — PERFLUTREN LIPID MICROSPHERE
INTRAVENOUS | Status: AC
  Filled 2016-07-10: qty 10

## 2016-07-10 MED ORDER — MORPHINE SULFATE (PF) 4 MG/ML IV SOLN: 2.0000 mg | INTRAVENOUS | Status: DC | PRN

## 2016-07-10 MED ORDER — KETOROLAC TROMETHAMINE 30 MG/ML IJ SOLN: 30.0000 mg | Freq: Four times a day (QID) | INTRAMUSCULAR | Status: DC | PRN

## 2016-07-10 MED ORDER — OXYCODONE-ACETAMINOPHEN 5-325 MG PO TABS: 2.0000 | ORAL_TABLET | Freq: Four times a day (QID) | ORAL | Status: DC | PRN

## 2016-07-10 MED ORDER — GI COCKTAIL ~~LOC~~
30.0000 mL | Freq: Once | ORAL | Status: AC
  Administered 2016-07-10: 30 mL via ORAL
  Filled 2016-07-10: qty 30

## 2016-07-10 MED ORDER — GABAPENTIN 300 MG PO CAPS
900.0000 mg | ORAL_CAPSULE | Freq: Three times a day (TID) | ORAL | Status: DC
  Administered 2016-07-10 – 2016-07-11 (×4): 900 mg via ORAL
  Filled 2016-07-10 (×4): qty 3

## 2016-07-10 MED ORDER — EZETIMIBE 10 MG PO TABS
10.0000 mg | ORAL_TABLET | Freq: Every day | ORAL | Status: DC
Start: 2016-07-10 — End: 2016-07-11
  Administered 2016-07-10 – 2016-07-11 (×2): 10 mg via ORAL
  Filled 2016-07-10 (×2): qty 1

## 2016-07-10 MED ORDER — KETOROLAC TROMETHAMINE 30 MG/ML IJ SOLN
30.0000 mg | Freq: Once | INTRAMUSCULAR | Status: AC
  Administered 2016-07-10: 30 mg via INTRAVENOUS
  Filled 2016-07-10: qty 1

## 2016-07-10 MED ORDER — PERFLUTREN LIPID MICROSPHERE
1.0000 mL | INTRAVENOUS | Status: AC | PRN
  Administered 2016-07-10: 3.5 mL via INTRAVENOUS
  Filled 2016-07-10: qty 10

## 2016-07-10 NOTE — H&P (Addendum)
History and Physical    Marc Walker JME:[id number] DOB: 07/29/1970 DOA: 07/09/2016  Referring MD/NP/PA:   PCP: Maren Reamer, MD   Patient coming from:  The patient is coming from home.  At baseline, pt is independent for most of ADL.  Chief Complaint: left flank pain  HPI: Marc Walker is a 46 y.o. male with medical history significant of hypertension, hyperlipidemia, diabetes mellitus, OSA on CPAP, obesity, CAD, s/p of stent, tobacco abuse, who presents with left flank pain.  Patient states that he started having flank pain since Christmas. The pain is located in left flank area, intermittent, 10/10 in severity currently, nonradiating, pleuritic pain, aggravated by deep breath and coughing. No rashes in this area. Pt denies chest pain. Patient does not have fever or chills. He has mild dry cough. No runny nose or sore throat. No calf tenderness. Patient states that he had flu symptoms around Christmas, which has completely resolved. Patient denies nausea, vomiting, abdominal pain, diarrhea, symptoms of UTI or unilateral weakness.  ED Course: pt was found to have troponin 0.45, WBC 15.1, INR 0.93, BNP 79.4, electrolytes and renal function okay, temperature normal, tachycardia, tachypnea, oxygen saturation 89-92% on room air. CTA of chest is negative for PE. CXR showed stable fibrotic changes. Patient is placed on telemetry bed for observation. Cardiology, Dr. Koleen Nimrod was consulted.  Review of Systems:   General: no fevers, chills, no changes in body weight, has fatigue HEENT: no blurry vision, hearing changes or sore throat Respiratory: no dyspnea, has coughing, no wheezing CV: no chest pain, no palpitations GI: no nausea, vomiting, abdominal pain, diarrhea, constipation GU: no dysuria, burning on urination, increased urinary frequency, hematuria  Ext: no leg edema Neuro: no unilateral weakness, numbness, or tingling, no vision change or hearing loss Skin: no rash, no skin  tear. MSK: No muscle spasm, no deformity, no limitation of range of movement in spin. Has left flank pain. Heme: No easy bruising.  Travel history: No recent long distant travel.  Allergy:  Allergies  Allergen Reactions  . 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  Pt only wants pain medicine if absolutely necessary.  . Levofloxacin Rash    Rash on back (not sun exposed)and hypersensitivity to skin on exposed skin/arm    Past Medical History:  Diagnosis Date  . Coronary artery disease   . Diabetes mellitus without complication ([place])   . Heartburn   . High cholesterol   . Hyperlipidemia associated with type 2 diabetes mellitus ([facility])   . Hypertension   . MI (myocardial infarction) 07/2009   [facility] in [place], [place] for last 2, 1st one in [place], [place]; total of 6 stents, last one 02/2014   . Morbid obesity ([facility]) 07/06/2016  . OSA (obstructive sleep apnea) 07/06/2016   Moderate with AHI 21.8/hr by PSG 08/2012 now on CPAP  . Thyroid disease    past history    Past Surgical History:  Procedure Laterality Date  . APPENDECTOMY    . CARDIAC CATHETERIZATION N/A 09/16/2015   Procedure: Left Heart Cath and Coronary Angiography;  Surgeon: Lorretta Harp, MD;  Location: [facility];  Service: Cardiovascular;  Laterality: N/A;  . CARDIAC CATHETERIZATION N/A 09/16/2015   Procedure: Coronary Stent Intervention;  Surgeon: Lorretta Harp, MD;  Location: [facility];  Service: Cardiovascular;  Laterality: N/A;  . CORONARY STENT PLACEMENT      Social History:  reports that  he has been smoking Cigarettes.  He has a 33.00 pack-year smoking history. He has never used smokeless tobacco. He reports that he does not drink alcohol or use drugs.  Family History:  Family History  Problem Relation Age of Onset  . Adopted: Yes  . Diabetes Maternal Grandmother      Prior to Admission medications   Medication Sig Start Date End Date Taking? Authorizing  Provider  amLODipine (NORVASC) 5 MG tablet Take 1 tablet (5 mg total) by mouth daily. 05/29/16 08/27/16 Yes Fay Records, MD  aspirin 81 MG tablet Take 1 tablet (81 mg total) by mouth daily. 07/06/16  Yes Maren Reamer, MD  clopidogrel (PLAVIX) 75 MG tablet Take 1 tablet (75 mg total) by mouth daily. 12/02/15  Yes Fay Records, MD  ezetimibe (ZETIA) 10 MG tablet Take 1 tablet (10 mg total) by mouth daily. 06/18/16 09/16/16 Yes Fay Records, MD  furosemide (LASIX) 40 MG tablet Take 1 tablet (40 mg total) by mouth 3 (three) times a week. 05/25/16 08/23/16 Yes Fay Records, MD  gabapentin (NEURONTIN) 300 MG capsule Take 3 capsules (900 mg total) by mouth 3 (three) times daily. 07/06/16  Yes Maren Reamer, MD  hydrochlorothiazide (HYDRODIURIL) 25 MG tablet Take 1 tablet (25 mg total) by mouth daily. 07/06/16  Yes Maren Reamer, MD  insulin aspart (NOVOLOG) 100 UNIT/ML injection Inject 20 Units into the skin 3 (three) times daily with meals. 07/06/16  Yes Maren Reamer, MD  insulin glargine (LANTUS) 100 UNIT/ML injection Inject 0.4 mLs (40 Units total) into the skin 2 (two) times daily. 07/06/16  Yes Maren Reamer, MD  lisinopril (PRINIVIL,ZESTRIL) 10 MG tablet Take 1 tablet (10 mg total) by mouth 2 (two) times daily. 09/30/15  Yes Imogene Burn, PA-C  metFORMIN (GLUCOPHAGE-XR) 500 MG 24 hr tablet TAKE 2 TABLETS BY MOUTH 2 TIMES DAILY WITH A MEAL. 05/01/16  Yes Dawn Lazarus Gowda, MD  nicotine (NICOTROL) 10 MG inhaler Inhale 1 cartridge (1 continuous puffing total) into the lungs as needed for smoking cessation. 09/30/15  Yes Imogene Burn, PA-C  nitroGLYCERIN (NITROSTAT) 0.4 MG SL tablet Place 1 tablet (0.4 mg total) under the tongue every 5 (five) minutes as needed for chest pain. 09/17/15  Yes Ripudeep Krystal Eaton, MD  potassium chloride (K-DUR) 10 MEQ tablet Take 1 tablet (10 mEq total) by mouth daily. 05/25/16 08/23/16 Yes Fay Records, MD  rosuvastatin (CRESTOR) 40 MG tablet Take 1 tablet (40 mg total) by mouth  daily. 07/06/16 10/04/16 Yes Dawn Lazarus Gowda, MD  blood glucose meter kit and supplies KIT Dispense based on patient and insurance preference. Use up to four times daily as directed. (FOR ICD-9 250.00, 250.01). 09/17/15   Ripudeep K Rai, MD  glucose blood (TRUE METRIX BLOOD GLUCOSE TEST) test strip Use as instructed 03/17/16   Maren Reamer, MD  Insulin Syringes, Disposable, U-100 0.5 ML MISC Take insulin as directed   Diagnosis: Insulin dependent diabetes mellitus ICD10 250 11/04/15   Maren Reamer, MD  metoprolol (LOPRESSOR) 50 MG tablet Take 1.5 tablets (75 mg total) by mouth 2 (two) times daily. Patient not taking: Reported on 07/10/2016 06/15/16 09/13/16  Fay Records, MD  TRUEPLUS LANCETS 28G MISC Use as directed 03/17/16   Maren Reamer, MD    Physical Exam: Vitals:   07/10/16 0130 07/10/16 0200 07/10/16 0231 07/10/16 0334  BP: 106/64 99/77 110/63 97/72  Pulse: 88 98 98 95  Resp:  _0 Temp:    98.2 F (36.8 C)  TempSrc:    Oral  SpO2: (!) 89% 92% 93% 94%  Weight:    128.2 kg (282 lb 11.2 oz)  Height:    _1  (1.676 m)   General: Not in acute distress HEENT:       Eyes: PERRL, EOMI, no scleral icterus.       ENT: No discharge from the ears and nose, no pharynx injection, no tonsillar enlargement.        Neck: No JVD, no bruit, no mass felt. Heme: No neck lymph node enlargement. Cardiac: S1/S2, RRR, No murmurs, No gallops or rubs. Respiratory: No rales, wheezing, rhonchi or rubs. GI: Soft, nondistended, nontender, no rebound pain, no organomegaly, BS present. GU: No hematuria Ext: No pitting leg edema bilaterally. 2+DP/PT pulse bilaterally. Musculoskeletal: No joint deformities, No joint redness or warmth, no limitation of ROM in spin. Has tenderness in left flank area. Skin: No rashes.  Neuro: Alert, oriented X3, cranial nerves II-XII grossly intact, moves all extremities normally.  Psych: Patient is not psychotic, no suicidal or hemocidal ideation.  Labs on  Admission: I have personally reviewed following labs and imaging studies  CBC:  Recent Labs Lab 07/09/16 2300  WBC 15.1*  HGB 14.1  HCT 40.9  MCV 88.9  PLT 892   Basic Metabolic Panel:  Recent Labs Lab 07/09/16 2300  NA 140  K 3.9  CL 103  CO2 24  GLUCOSE 168*  BUN 16  CREATININE 1.03  CALCIUM 8.9   GFR: Estimated Creatinine Clearance: 112.4 mL/min (by C-G formula based on SCr of 1.03 mg/dL). Liver Function Tests: No results for input(s): AST, ALT, ALKPHOS, BILITOT, PROT, ALBUMIN in the last 168 hours. No results for input(s): LIPASE, AMYLASE in the last 168 hours. No results for input(s): AMMONIA in the last 168 hours. Coagulation Profile:  Recent Labs Lab 07/09/16 2300  INR 0.93   Cardiac Enzymes: No results for input(s): CKTOTAL, CKMB, CKMBINDEX, TROPONINI in the last 168 hours. BNP (last 3 results) No results for input(s): PROBNP in the last 8760 hours. HbA1C: No results for input(s): HGBA1C in the last 72 hours. CBG: No results for input(s): GLUCAP in the last 168 hours. Lipid Profile: No results for input(s): CHOL, HDL, LDLCALC, TRIG, CHOLHDL, LDLDIRECT in the last 72 hours. Thyroid Function Tests: No results for input(s): TSH, T4TOTAL, FREET4, T3FREE, THYROIDAB in the last 72 hours. Anemia Panel: No results for input(s): VITAMINB12, FOLATE, FERRITIN, TIBC, IRON, RETICCTPCT in the last 72 hours. Urine analysis: No results found for: COLORURINE, APPEARANCEUR, LABSPEC, PHURINE, GLUCOSEU, HGBUR, BILIRUBINUR, KETONESUR, PROTEINUR, UROBILINOGEN, NITRITE, LEUKOCYTESUR Sepsis Labs: _2 (procalcitonin:4,lacticidven:4) )No results found for this or any previous visit (from the past 240 hour(s)).   Radiological Exams on Admission: Dg Chest 2 View  Result Date: 07/09/2016 CLINICAL DATA:  Left rib pain with general chest pain tonight. Persistent cough since 06/23/2016. Shortness of breath. History of diabetes and hypertension. Current smoker. EXAM: CHEST   2 VIEW COMPARISON:  06/23/2016 FINDINGS: Mild interstitial changes in the lung bases suggesting fibrosis. No change since prior study. No focal airspace disease or consolidation in the lungs. No blunting of costophrenic angles. No pneumothorax. Normal heart size and pulmonary vascularity. Mediastinal contours appear intact. Degenerative changes in the spine. IMPRESSION: Slight fibrosis in the lung bases. No evidence of active pulmonary disease. Electronically Signed   By: Lucienne Capers M.D.   On: 07/09/2016 23:09   Ct Angio Chest Pe  W And/or Wo Contrast  Result Date: 07/10/2016 CLINICAL DATA:  Left-sided chest pain. Elevated troponin. History of hypertension, diabetes, coronary artery disease, coronary stents, and cardiac catheterization. EXAM: CT ANGIOGRAPHY CHEST WITH CONTRAST TECHNIQUE: Multidetector CT imaging of the chest was performed using the standard protocol during bolus administration of intravenous contrast. Multiplanar CT image reconstructions and MIPs were obtained to evaluate the vascular anatomy. CONTRAST:  100 mL Isovue 370 COMPARISON:  11/26/2015 FINDINGS: Cardiovascular: Satisfactory opacification of the pulmonary arteries to the segmental level. No evidence of pulmonary embolism. Normal heart size. No pericardial effusion. Coronary artery stents. Normal caliber thoracic aorta. No aortic dissection. Mediastinum/Nodes: No enlarged mediastinal, hilar, or axillary lymph nodes. Thyroid gland, trachea, and esophagus demonstrate no significant findings. Lungs/Pleura: Mild atelectasis in the lung bases. Left upper lobe consolidation seen previously has resolved. No focal consolidation or airspace disease today. Airways are patent. No pleural effusions. No pneumothorax. Upper Abdomen: No acute abnormality. Musculoskeletal: Degenerative changes in the spine. No destructive bone lesions. Review of the MIP images confirms the above findings. IMPRESSION: No evidence of significant pulmonary embolus.  No evidence of active pulmonary disease. Electronically Signed   By: Lucienne Capers M.D.   On: 07/10/2016 00:32     EKG: Independently reviewed. Sinus rhythm, QTC 447, T-wave flattening in inferior leads.   Assessment/Plan Principal Problem:   Elevated troponin Active Problems:   Essential hypertension   Diabetes mellitus without complication (HCC)   Hyperlipidemia   CAD (coronary artery disease)   Tobacco abuse   OSA (obstructive sleep apnea)   Morbid obesity (HCC)   Left flank pain   Leukocytosis   SIRS (systemic inflammatory response syndrome) ([facility])  Elevated troponin and left flank pain and hx of CAD: s/p of stent (9 stents). Etiology of her left flank pain is not clear. CT angiogram is negative for PE. No rashes for shingles. Patient has positive troponin 0.45. Cardiology, Dr. Koleen Nimrod was consulted-->thinks "His symptoms and examination is more musculoskeletal in nature and troponin level is likely a false positive.  Low suspicion for acute coronary syndrome, or myopericarditis.  Pleurisy versus musculoskeletal chest pain likely etiology." He recommended to start NSAIDs.   - will place on Tele bed for obs - Appreciate Dr. Hans Eden recommendation - cycle CE q6 x3 and repeat her EKG in the am  - prn Nitroglycerin, Morphine and percocet - Aspirin, crestor, zetia and metoprolol, plavix - start ibuprofen and protonix - Risk factor stratification: will check FLP, UDS and A1C  - 2d echo  Hypertension: Blood pressure is soft -Hold Lasix due to SIRS -continue metoprolol -Continue amlodipine, HCTZ and lisinopril (Put holding parameter)  HLD: Last LDL was 123 on 06/15/16 -Continue home medications: Zetia and crestor  Tobacco abuse: -Did counseling about importance of quitting smoking -Nicotine patch  OSA: -CPAP  DM-II: Last A1c 7.7 on 07/07/15, not well controled. Patient is taking metformin, NovoLog, Lantus at home -will decrease Lantus dose from  40 units to 28  units twice a day -SSI  Leukocytosis and SIRS: Patient meets criteria for SIRS with leukocytosis, tachycardia, and tachypnea. Source of is not identified. No fever. Chest x-ray has no infiltration. Pending Lactic acid. -get urinalysis -will get Procalcitonin and trend lactic acid levels per sepsis protocol. -IVF: 100 cc/h  -Hold lasix  DVT ppx: SQ Lovenox Code Status: Full code Family Communication: Yes, patient's  father at bed side Disposition Plan:  Anticipate discharge back to previous home environment Consults called: Card, Dr. Lazaro Arms Admission status: Obs / tele  Date of Service 07/10/2016    Ivor Costa [facility] Pager [phone]  If 7PM-7AM, please contact night-coverage [email or web address] Password [facility] 07/10/2016, 3:46 AM

## 2016-07-10 NOTE — Progress Notes (Signed)
Pt at [facility]  Called with all lead off to have tech check leads, patient was asleep with arousal.fixed and SR in the 70s

## 2016-07-10 NOTE — Progress Notes (Signed)
RT set up CPAP at patient's bedside and showed patient how to adjust mask and operate CPAP. Patient stated he would put on CPAP when he is ready for bed. RT informed patient to have RN call RT if assistance is needed. RT will monitor as needed.

## 2016-07-10 NOTE — ED Notes (Signed)
Campos MD also wants repeat EKG

## 2016-07-10 NOTE — Progress Notes (Signed)
Patient transferred to [facility] [place] from the Ed. Patient oriented to room. Patient stable and in bed. Will continue to monitor. Petra Kubarica Annesha Delgreco Rn 07/10/2016 4:09 AM

## 2016-07-10 NOTE — ED Notes (Signed)
Pt ambulatory to bathroom, steady gait

## 2016-07-10 NOTE — Progress Notes (Signed)
Patient Name: Marc Walker Date of Encounter: 07/10/2016  Primary Cardiologist: Dr Mittie Bodooss  Hospital Problem List     Principal Problem:   Elevated troponin Active Problems:   Chest pain   Essential hypertension   Hyperlipidemia associated with type 2 diabetes mellitus (HCC)   Hyperlipidemia   CAD S/P multiple PCI's   Tobacco abuse   DM type 2 with diabetic peripheral neuropathy ([facility]CC)   OSA (obstructive sleep apnea)   Morbid obesity ([facility]CC)   Left flank pain   Leukocytosis   SIRS (systemic inflammatory response syndrome) ([facility])     Subjective   Still complaining of Lt lateral chest pain "like a knife"  Inpatient Medications    Scheduled Meds: . amLODipine  5 mg Oral Daily  . aspirin  81 mg Oral Daily  . clopidogrel  75 mg Oral Daily  . enoxaparin (LOVENOX) injection  40 mg Subcutaneous Q24H  . ezetimibe  10 mg Oral Daily  . gabapentin  900 mg Oral TID  . hydrochlorothiazide  25 mg Oral Daily  . ibuprofen  400 mg Oral TID  . insulin aspart  0-9 Units Subcutaneous TID WC  . insulin glargine  28 Units Subcutaneous BID  . ketorolac  30 mg Intravenous Once  . lisinopril  10 mg Oral BID  . metoprolol  75 mg Oral BID  . pantoprazole  40 mg Oral Q1200  . rosuvastatin  40 mg Oral q1800   Continuous Infusions: . sodium chloride 100 mL/hr at 07/10/16 1000   PRN Meds: acetaminophen, dextromethorphan-guaiFENesin, morphine injection, nitroGLYCERIN, ondansetron (ZOFRAN) IV, oxyCODONE-acetaminophen, perflutren lipid microspheres (DEFINITY) IV suspension, zolpidem   Vital Signs    Vitals:   07/10/16 0231 07/10/16 0334 07/10/16 1017 07/10/16 1127  BP: 110/63 97/72 101/81 100/70  Pulse: 98 95 78 72  Resp: 24 19 20 18   Temp:  98.2 F (36.8 C) 97.4 F (36.3 C) 97.5 F (36.4 C)  TempSrc:  Oral Axillary Oral  SpO2: 93% 94% 97% 94%  Weight:  282 lb 11.2 oz (128.2 kg)    Height:  5\' 6"  (1.676 m)      Intake/Output Summary (Last 24 hours) at 07/10/16 1138 Last data filed  at 07/10/16 1000  Gross per 24 hour  Intake          1098.33 ml  Output                0 ml  Net          1098.33 ml   Filed Weights   07/09/16 2229 07/10/16 0334  Weight: 281 lb 9 oz (127.7 kg) 282 lb 11.2 oz (128.2 kg)    Physical Exam    GEN: Morbidly obese male, in no acute distress.  HEENT: Grossly normal.  Neck: Supple, no JVD, carotid bruits, or masses. Cardiac: RRR, no murmurs, rubs, or gallops. No clubbing, cyanosis, edema.  Radials/DP/PT 2+ and equal bilaterally.  Respiratory:  Respirations regular and unlabored, clear to auscultation bilaterally. MS: no deformity or atrophy. Skin: warm and dry, no rash. Neuro:  Strength and sensation are intact. Psych: AAOx3.  Normal affect.  Labs    CBC  Recent Labs  07/09/16 2300 07/10/16 1002  WBC 15.1* 14.7*  NEUTROABS  --  9.6*  HGB 14.1 13.9  HCT 40.9 40.9  MCV 88.9 88.9  PLT 240 240   Basic Metabolic Panel  Recent Labs  07/09/16 2300  NA 140  K 3.9  CL 103  CO2 24  GLUCOSE 168*  BUN 16  CREATININE 1.03  CALCIUM 8.9   Liver Function Tests No results for input(s): AST, ALT, ALKPHOS, BILITOT, PROT, ALBUMIN in the last 72 hours. No results for input(s): LIPASE, AMYLASE in the last 72 hours. Cardiac Enzymes  Recent Labs  07/10/16 0308 07/10/16 1002  TROPONINI 0.53* 0.40*    Recent Labs  07/10/16 0416  CHOL 102  HDL 35*  LDLCALC 49  TRIG 91  CHOLHDL 2.9    Telemetry    NSR - Personally Reviewed  ECG     NSR- Personally Reviewed  Radiology    Dg Chest 2 View  Result Date: 07/09/2016 CLINICAL DATA:  Left rib pain with general chest pain tonight. Persistent cough since 06/23/2016. Shortness of breath. History of diabetes and hypertension. Current smoker. EXAM: CHEST  2 VIEW COMPARISON:  06/23/2016 FINDINGS: Mild interstitial changes in the lung bases suggesting fibrosis. No change since prior study. No focal airspace disease or consolidation in the lungs. No blunting of costophrenic  angles. No pneumothorax. Normal heart size and pulmonary vascularity. Mediastinal contours appear intact. Degenerative changes in the spine. IMPRESSION: Slight fibrosis in the lung bases. No evidence of active pulmonary disease. Electronically Signed   By: Burman Nieves M.D.   On: 07/09/2016 23:09   Ct Angio Chest Pe W And/or Wo Contrast  Result Date: 07/10/2016 CLINICAL DATA:  Left-sided chest pain. Elevated troponin. History of hypertension, diabetes, coronary artery disease, coronary stents, and cardiac catheterization. EXAM: CT ANGIOGRAPHY CHEST WITH CONTRAST TECHNIQUE: Multidetector CT imaging of the chest was performed using the standard protocol during bolus administration of intravenous contrast. Multiplanar CT image reconstructions and MIPs were obtained to evaluate the vascular anatomy. CONTRAST:  100 mL Isovue 370 COMPARISON:  11/26/2015 FINDINGS: Cardiovascular: Satisfactory opacification of the pulmonary arteries to the segmental level. No evidence of pulmonary embolism. Normal heart size. No pericardial effusion. Coronary artery stents. Normal caliber thoracic aorta. No aortic dissection. Mediastinum/Nodes: No enlarged mediastinal, hilar, or axillary lymph nodes. Thyroid gland, trachea, and esophagus demonstrate no significant findings. Lungs/Pleura: Mild atelectasis in the lung bases. Left upper lobe consolidation seen previously has resolved. No focal consolidation or airspace disease today. Airways are patent. No pleural effusions. No pneumothorax. Upper Abdomen: No acute abnormality. Musculoskeletal: Degenerative changes in the spine. No destructive bone lesions. Review of the MIP images confirms the above findings. IMPRESSION: No evidence of significant pulmonary embolus. No evidence of active pulmonary disease. Electronically Signed   By: Burman Nieves M.D.   On: 07/10/2016 00:32    Cardiac Studies   Cath/PCI- 09/16/15  Coronary Findings   Dominance: Right  Left Anterior  Descending  Ost LAD to Prox LAD lesion, 0% stenosed. Previously placed Ost LAD to Prox LAD stent (unknown type) is patent.  Prox LAD to Mid LAD lesion, 90% stenosed.  PCI: An unspecified stent was placed.  There is no residual stenosis post intervention.  Mid LAD to Dist LAD lesion, 70% stenosed. The lesion was previously treated with a stent (unknown type).  PCI: An unspecified stent was placed.  There is no residual stenosis post intervention.  Second Diagonal Branch  2nd Diag lesion, 90% stenosed.  Left Circumflex  Mid Cx lesion, 70% stenosed.  Second Obtuse Marginal Branch  Ost 2nd Mrg to 2nd Mrg lesion, 0% stenosed. Previously placed Ost 2nd Mrg to 2nd Mrg stent (unknown type) is patent.  Right Coronary Artery  Mid RCA lesion, 80% stenosed.  PCI: An unspecified stent was placed.  There is no residual stenosis post  intervention.  Mid RCA to Dist RCA lesion, 20% stenosed. The lesion was previously treated with a stent (unknown type).  Right Posterior Descending Artery  Ost RPDA to RPDA lesion, 20% stenosed. The lesion was previously treated with a stent (unknown type).  PCI: An unspecified stent was placed.  There is a 20% residual stenosis post intervention.  Wall Motion              Coronary Diagrams   Diagnostic Diagram     Post-Intervention Diagram     Implants     Myoview 03/26/16  Nuclear stress EF: 55%.  There was no ST segment deviation noted during stress.  Defect 1: There is a medium defect of mild severity present in the basal inferolateral, mid inferolateral and apical inferior location.  Findings consistent with ischemia.  This is a low risk study.   Echo: pending   Patient Profile     46 y.o. male with past medical history of coronary artery disease status post multiple stents prior with recent drug-eluting stent to his LAD with RCA angioplasty, insulin dependent diabetes, tobacco abuse, obesity, hypertension, obstructive sleep apnea who is  here today due to chest pain.   Assessment & Plan    Atypical chest pain-elevated Troponin.  Signed, Corine Shelter, PA-C  07/10/2016, 11:38 AM   I have personally seen and examined this patient with Corine Shelter, PA-C. I agree with the assessment and plan as outlined above. He is known to have CAD with prior coronary stent placement. He is now presenting with atypical left sided pain, mostly under his axilla. This is different from his prior cardiac pain. The pain is reproducible to touch. Worsened with deep breaths. He has had a cough at home. EKG without ischemic changes. I cannot explain the elevated troponin. This does not appear to be ACS. No evidence of PE on CTA chest. I do not think a cardiac cath is indicated. The patient does not wish to have a cath. Continue current meds. No plans for invasive cardiac evaluation. Echo pending today.   Verne Carrow 07/10/2016 11:54 AM

## 2016-07-10 NOTE — Progress Notes (Signed)
CRITICAL VALUE ALERT  Critical value received:  Troponin 0.53  Date of notification:  07/10/16   Time of notification:  0524  Critical value read back: Yes  Nurse who received alert:  Petra KubaErica Mavrik Bynum  MD notified (1st page):  Maren ReamerKaren Kirby  Time of first page:  0526  Henreitta Cearica Monico Sudduth Rn 07/10/2016 5:27 AM

## 2016-07-10 NOTE — ED Notes (Signed)
Called main lab to add on BNP, pt also to CT

## 2016-07-10 NOTE — Progress Notes (Signed)
  Echocardiogram 2D Echocardiogram has been performed.  Marc Walker, Marc Walker 07/10/2016, 6:32 PM

## 2016-07-10 NOTE — Progress Notes (Signed)
[facility]  Interval Note   Patient seen and examined, patient admitted after midnight, see H&P for full details. Patient continues to complain of left side chest pain under the axilla stabbing in origin.  Pain is responding somewhat to anti-inflammatories, extra dose of Toradol was given.  Cardiology evaluated and deemed that this is not cardiac and no further cardiac evaluations are necessary at this moment Troponin elevation could not be explained at this moment. We'll observe patient overnight repeat TNI in the morning and if continued to trend down will discharge.   Latrelle DodrillEdwin Silva, MD

## 2016-07-10 NOTE — Consult Note (Signed)
History & Physical    Patient ID: Marc Walker MRN: [id number], DOB/AGE: 10/16/1970   Admit date: 07/09/2016   Primary Physician: Maren Reamer, MD Primary Cardiologist: Dorris Carnes, MD   History of Present Illness    Marc Walker is a 46 y.o. male with past medical history of coronary artery disease status post multiple stents prior with recent drug-eluting stent to his LAD with RCA angioplasty, insulin dependent diabetes, tobacco abuse, obesity, hypertension, obstructive sleep apnea who is here today due to chest pain.  Patient reports since this past Christmas, he has been having a pain in his lower "rib" underneath his left arm.  The pain has been intermittent, sharp, does not go to any other location, and sometimes a 14/10 in intensity.  The pain is brought on an made worse with coughing, deep breaths, touching his left side, and changing positions.  He does not recall any alleviating factors and has not tried any pain medications to make it worse.  His pain has been constant for the past 2-days and currently 5/10 in intensity.  He denies any other related symptoms such as being short of breath, nausea, lightheadedness, dizziness, or feeling as if he is going to faint.  Patient does report some swelling in his legs that is not new for him.  Marc Walker does feel that his symptoms started around the time he "caught the flu".  He is unaware if he had a flu swab to confirm that he had the flu.  He continues to have a cough that is getting better.     Past Medical History    Past Medical History:  Diagnosis Date  . Coronary artery disease   . Diabetes mellitus without complication ([place])   . Heartburn   . High cholesterol   . Hyperlipidemia associated with type 2 diabetes mellitus ([place])   . Hypertension   . MI (myocardial infarction) 07/2009   [facility] in [place], [place] for last 2, 1st one in [place], [place]; total of 6 stents, last one 02/2014   . Morbid obesity ([place])  07/06/2016  . OSA (obstructive sleep apnea) 07/06/2016   Moderate with AHI 21.8/hr by PSG 08/2012 now on CPAP  . Thyroid disease    past history    Past Surgical History:  Procedure Laterality Date  . APPENDECTOMY    . CARDIAC CATHETERIZATION N/A 09/16/2015   Procedure: Left Heart Cath and Coronary Angiography;  Surgeon: Lorretta Harp, MD;  Location: [facility];  Service: Cardiovascular;  Laterality: N/A;  . CARDIAC CATHETERIZATION N/A 09/16/2015   Procedure: Coronary Stent Intervention;  Surgeon: Lorretta Harp, MD;  Location: [facility];  Service: Cardiovascular;  Laterality: N/A;  . CORONARY STENT PLACEMENT       Allergies  Allergies  Allergen Reactions  . 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  Pt only wants pain medicine if absolutely necessary.  . Levofloxacin Rash    Rash on back (not sun exposed)and hypersensitivity to skin on exposed skin/arm     Home Medications    Prior to Admission medications   Medication Sig Start Date End Date Taking? Authorizing Provider  amLODipine (NORVASC) 5 MG tablet Take 1 tablet (5 mg total) by mouth daily. 05/29/16 08/27/16 Yes Fay Records, MD  aspirin 81 MG tablet Take 1 tablet (81 mg total) by mouth daily. 07/06/16  Yes Maren Reamer, MD  clopidogrel (  PLAVIX) 75 MG tablet Take 1 tablet (75 mg total) by mouth daily. 12/02/15  Yes Fay Records, MD  ezetimibe (ZETIA) 10 MG tablet Take 1 tablet (10 mg total) by mouth daily. 06/18/16 09/16/16 Yes Fay Records, MD  furosemide (LASIX) 40 MG tablet Take 1 tablet (40 mg total) by mouth 3 (three) times a week. 05/25/16 08/23/16 Yes Fay Records, MD  gabapentin (NEURONTIN) 300 MG capsule Take 3 capsules (900 mg total) by mouth 3 (three) times daily. 07/06/16  Yes Maren Reamer, MD  hydrochlorothiazide (HYDRODIURIL) 25 MG tablet Take 1 tablet (25 mg total) by mouth daily. 07/06/16  Yes Maren Reamer, MD  insulin aspart (NOVOLOG) 100 UNIT/ML injection Inject 20  Units into the skin 3 (three) times daily with meals. 07/06/16  Yes Maren Reamer, MD  insulin glargine (LANTUS) 100 UNIT/ML injection Inject 0.4 mLs (40 Units total) into the skin 2 (two) times daily. 07/06/16  Yes Maren Reamer, MD  lisinopril (PRINIVIL,ZESTRIL) 10 MG tablet Take 1 tablet (10 mg total) by mouth 2 (two) times daily. 09/30/15  Yes Imogene Burn, PA-C  metFORMIN (GLUCOPHAGE-XR) 500 MG 24 hr tablet TAKE 2 TABLETS BY MOUTH 2 TIMES DAILY WITH A MEAL. 05/01/16  Yes Dawn Lazarus Gowda, MD  nicotine (NICOTROL) 10 MG inhaler Inhale 1 cartridge (1 continuous puffing total) into the lungs as needed for smoking cessation. 09/30/15  Yes Imogene Burn, PA-C  nitroGLYCERIN (NITROSTAT) 0.4 MG SL tablet Place 1 tablet (0.4 mg total) under the tongue every 5 (five) minutes as needed for chest pain. 09/17/15  Yes Ripudeep Krystal Eaton, MD  potassium chloride (K-DUR) 10 MEQ tablet Take 1 tablet (10 mEq total) by mouth daily. 05/25/16 08/23/16 Yes Fay Records, MD  rosuvastatin (CRESTOR) 40 MG tablet Take 1 tablet (40 mg total) by mouth daily. 07/06/16 10/04/16 Yes Dawn Lazarus Gowda, MD  blood glucose meter kit and supplies KIT Dispense based on patient and insurance preference. Use up to four times daily as directed. (FOR ICD-9 250.00, 250.01). 09/17/15   Ripudeep K Rai, MD  glucose blood (TRUE METRIX BLOOD GLUCOSE TEST) test strip Use as instructed 03/17/16   Maren Reamer, MD  Insulin Syringes, Disposable, U-100 0.5 ML MISC Take insulin as directed   Diagnosis: Insulin dependent diabetes mellitus ICD10 250 11/04/15   Maren Reamer, MD  metoprolol (LOPRESSOR) 50 MG tablet Take 1.5 tablets (75 mg total) by mouth 2 (two) times daily. Patient not taking: Reported on 07/10/2016 06/15/16 09/13/16  Fay Records, MD  TRUEPLUS LANCETS 28G MISC Use as directed 03/17/16   Maren Reamer, MD    Family History    Family History  Problem Relation Age of Onset  . Adopted: Yes  . Diabetes Maternal Grandmother     Social  History    Social History   Social History  . Marital status: Divorced    Spouse name: N/A  . Number of children: N/A  . Years of education: N/A   Occupational History  . Biochemist, clinical    Social History Main Topics  . Smoking status: Current Every Day Smoker    Packs/day: 1.00    Years: 33.00    Types: Cigarettes  . Smokeless tobacco: Never Used  . Alcohol use No  . Drug use: No  . Sexual activity: Not on file   Other Topics Concern  . Not on file   Social History Narrative   Adopted. Very little info on  biological parents, ?diabetes.   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  Physical Exam    Blood pressure 124/77, pulse 98, temperature 98.3 F (36.8 C), temperature source Oral, resp. rate 23, height '5\' 5"'  (1.651 m), weight 127.7 kg (281 lb 9 oz), SpO2 96 %.  General: Well developed, well nourished,male in no acute distress. Head: Normocephalic, atraumatic, sclera non-icteric, no xanthomas, nares are without discharge. Dentition:  Neck: No carotid bruits. JVD not elevated.  Lungs: Respirations regular and unlabored, without wheezes or rales.  Heart: Regular rate and rhythm. No S3 or S4.  No murmur, no rubs, or gallops appreciated. Abdomen: Soft, non-tender, non-distended with normoactive bowel sounds. No hepatomegaly. No rebound/guarding. No obvious abdominal masses. Msk:  Point tenderness on at his left axillary.  Strength and tone appear normal for age. No joint deformities or effusions. Extremities: No clubbing or cyanosis. No edema.  Distal pedal pulses are 2+ bilaterally. Neuro: Alert and oriented X 3. Moves all extremities spontaneously. No focal deficits noted. Psych:  Responds to questions appropriately with a normal affect. Skin: No rashes or lesions noted  Labs    Troponin (Point of Care Test)  Recent Labs  07/09/16 2300  TROPIPOC 0.45*   No  results for input(s): CKTOTAL, CKMB, TROPONINI in the last 72 hours. Lab Results  Component Value Date   WBC 15.1 (H) 07/09/2016   HGB 14.1 07/09/2016   HCT 40.9 07/09/2016   MCV 88.9 07/09/2016   PLT 240 07/09/2016    Recent Labs Lab 07/09/16 2300  NA 140  K 3.9  CL 103  CO2 24  BUN 16  CREATININE 1.03  CALCIUM 8.9  GLUCOSE 168*   Lab Results  Component Value Date   CHOL 191 06/15/2016   HDL 48 06/15/2016   LDLCALC 123 (H) 06/15/2016   TRIG 99 06/15/2016   No results found for: DDIMER   Brain Natriuretic Peptide  Date/Time Value Ref Range Status  06/15/2016 10:37 AM 23.3 <100 pg/mL Final    Comment:      BNP levels increase with age in the general population with the highest values seen in individuals greater than 64 years of age. Reference: Joellyn Rued Cardiol 2002; 88:891-69.     04/17/2016 09:47 AM 106.1 (H) <100 pg/mL Final    Comment:      BNP levels increase with age in the general population with the highest values seen in individuals greater than 12 years of age. Reference: Joellyn Rued Cardiol 2002; 45:038-88.      B Natriuretic Peptide  Date/Time Value Ref Range Status  07/09/2016 11:50 PM 71.4 0.0 - 100.0 pg/mL Final   No results found for: PROBNP  Recent Labs  07/09/16 2300  INR 0.93      Radiology Studies    Dg Chest 2 View  Result Date: 07/09/2016 CLINICAL DATA:  Left rib pain with general chest pain tonight. Persistent cough since 06/23/2016. Shortness of breath. History of diabetes and hypertension. Current smoker. EXAM: CHEST  2 VIEW COMPARISON:  06/23/2016 FINDINGS: Mild interstitial changes in the lung bases suggesting fibrosis. No change since prior study. No focal airspace disease or consolidation in the lungs. No blunting of costophrenic angles. No pneumothorax. Normal heart size and pulmonary vascularity. Mediastinal contours appear intact. Degenerative changes in the spine. IMPRESSION: Slight fibrosis in the lung bases. No  evidence  of active pulmonary disease. Electronically Signed   By: Lucienne Capers M.D.   On: 07/09/2016 23:09   Dg Chest 2 View  Result Date: 06/23/2016 CLINICAL DATA:  Shortness of breath. EXAM: CHEST  2 VIEW COMPARISON:  05/24/2016 .  09/01/2015 06/11/2015. FINDINGS: Cardiomegaly. No pulmonary venous congestion. Mild interstitial prominence. Bilateral pleural thickening again noted consistent with scarring. No pleural effusion or pneumothorax . IMPRESSION: 1. Stable cardiomegaly.  No pulmonary venous congestion. 2. Mild bilateral from interstitial prominence. Mild pneumonitis cannot be excluded. Electronically Signed   By: Marcello Moores  Register   On: 06/23/2016 11:22   Ct Angio Chest Pe W And/or Wo Contrast  Result Date: 07/10/2016 CLINICAL DATA:  Left-sided chest pain. Elevated troponin. History of hypertension, diabetes, coronary artery disease, coronary stents, and cardiac catheterization. EXAM: CT ANGIOGRAPHY CHEST WITH CONTRAST TECHNIQUE: Multidetector CT imaging of the chest was performed using the standard protocol during bolus administration of intravenous contrast. Multiplanar CT image reconstructions and MIPs were obtained to evaluate the vascular anatomy. CONTRAST:  100 mL Isovue 370 COMPARISON:  11/26/2015 FINDINGS: Cardiovascular: Satisfactory opacification of the pulmonary arteries to the segmental level. No evidence of pulmonary embolism. Normal heart size. No pericardial effusion. Coronary artery stents. Normal caliber thoracic aorta. No aortic dissection. Mediastinum/Nodes: No enlarged mediastinal, hilar, or axillary lymph nodes. Thyroid gland, trachea, and esophagus demonstrate no significant findings. Lungs/Pleura: Mild atelectasis in the lung bases. Left upper lobe consolidation seen previously has resolved. No focal consolidation or airspace disease today. Airways are patent. No pleural effusions. No pneumothorax. Upper Abdomen: No acute abnormality. Musculoskeletal: Degenerative changes  in the spine. No destructive bone lesions. Review of the MIP images confirms the above findings. IMPRESSION: No evidence of significant pulmonary embolus. No evidence of active pulmonary disease. Electronically Signed   By: Lucienne Capers M.D.   On: 07/10/2016 00:32    EKG & Cardiac Imaging    EKG:  07/10/15 (22:38):  NSR, anterior infarct age undetermined, non-specific T-wave changes.  SPECT Myocardial Perfusion Imaging 03/26/16:  Nuclear stress EF: 55%.  There was no ST segment deviation noted during stress.  Defect 1: There is a medium defect of mild severity present in the basal inferolateral, mid inferolateral and apical inferior location.  Findings consistent with ischemia.  This is a low risk study.  Assessment & Plan    Active Problems:   # Chest Pain: Patient with non-typical chest pain that is reproducible on examination in the setting of a mildly elevated troponin level.  His symptoms and examination is more musculoskeletal in nature and troponin level is likely a false positive.  Low suspicion for acute coronary syndrome, or myopericarditis.  Pleurisy versus musculoskeletal chest pain likely etiology.  Pulmonary embolism has been ruled out with recent CT chest.  ECG is without any specific signs of myocardial infarction/ischemia.  Reasonable to admit for observation with additional troponin levels.    - Continue serial troponin levels at this. - Start NSAIDS potentially for chest pain.    # Coronary artery disease: Known history of coronary artery disease now here with non-typical anginal chest pain.  Patient's ECG is without any specific signs of myocardial ischemia/infarction.  He is on guideline directed medical therapy at home. - Continue home clopidogrel, aspirin, metoprolol, rosuvastatin.  # Hypertension: Known history of essential hypertension.  Blood pressure is at goal.  He is without any end-organ damag. - Continue home blood pressure medications.      Signed, Jon Billings, MD 07/10/2016, 1:14 AM

## 2016-07-11 DIAGNOSIS — I10 Essential (primary) hypertension: Secondary | ICD-10-CM

## 2016-07-11 DIAGNOSIS — R0789 Other chest pain: Secondary | ICD-10-CM

## 2016-07-11 LAB — CBC
HCT: 37.3 % — ABNORMAL LOW (ref 39.0–52.0)
Hemoglobin: 12.6 g/dL — ABNORMAL LOW (ref 13.0–17.0)
MCH: 30.4 pg (ref 26.0–34.0)
MCHC: 33.8 g/dL (ref 30.0–36.0)
MCV: 89.9 fL (ref 78.0–100.0)
PLATELETS: 202 10*3/uL (ref 150–400)
RBC: 4.15 MIL/uL — AB (ref 4.22–5.81)
RDW: 13.5 % (ref 11.5–15.5)
WBC: 13.5 10*3/uL — AB (ref 4.0–10.5)

## 2016-07-11 LAB — BASIC METABOLIC PANEL
Anion gap: 7 (ref 5–15)
BUN: 17 mg/dL (ref 6–20)
CALCIUM: 8.4 mg/dL — AB (ref 8.9–10.3)
CO2: 24 mmol/L (ref 22–32)
Chloride: 104 mmol/L (ref 101–111)
Creatinine, Ser: 0.69 mg/dL (ref 0.61–1.24)
GFR calc non Af Amer: >60 mL/min (ref >60)
Glucose, Bld: 198 mg/dL — ABNORMAL HIGH (ref 65–99)
Potassium: 3.9 mmol/L (ref 3.5–5.1)
SODIUM: 135 mmol/L (ref 135–145)

## 2016-07-11 LAB — GLUCOSE, CAPILLARY
GLUCOSE-CAPILLARY: 193 mg/dL — AB (ref 65–99)
GLUCOSE-CAPILLARY: 222 mg/dL — AB (ref 65–99)

## 2016-07-11 LAB — HEMOGLOBIN A1C
Hgb A1c MFr Bld: 7.8 % — ABNORMAL HIGH (ref 4.8–5.6)
MEAN PLASMA GLUCOSE: 177 mg/dL

## 2016-07-11 MED ORDER — FUROSEMIDE 40 MG PO TABS
40.0000 mg | ORAL_TABLET | Freq: Every day | ORAL | Status: DC
  Administered 2016-07-11: 40 mg via ORAL
  Filled 2016-07-11: qty 1

## 2016-07-11 MED ORDER — IBUPROFEN 800 MG PO TABS: 800.0000 mg | ORAL_TABLET | Freq: Three times a day (TID) | ORAL | 0 refills | Status: DC | PRN

## 2016-07-11 NOTE — Progress Notes (Signed)
Patient put on CPAP tonight.

## 2016-07-11 NOTE — Discharge Summary (Signed)
Physician Discharge Summary  Marc Walker  YWV:[id number]  DOB: 1970-07-26  DOA: 07/09/2016 PCP: Maren Reamer, MD  Admit date: 07/09/2016 Discharge date: 07/11/2016  Admitted From: Home Disposition:  Home  Recommendations for Outpatient Follow-up:  1. Follow up with PCP in 1 weeks 2. Please obtain BMP/CBC in one week   Discharge Condition: Stable CODE STATUS: FULL Diet recommendation: Heart Healthy / Carb Modified   Brief/Interim Summary: Marc Walker is a 46 y.o. male with medical history significant of hypertension, hyperlipidemia, diabetes mellitus, OSA on CPAP, obesity, CAD, s/p of stent, tobacco abuse, who presented with left lower chest pian at the mid axilla. Patient reported pain started around christmas time, and has been progressively worsen. Patient was 10/10, reproducible to touch. Patient was placed on observation for evaluation of chest pain. Patient had a mild elevation of TNIs without EKG changes. Cardiology was consulted and deemed that pain was not cardiac in origin, and they did not have explanation for mild elevation of the TNI. Patient was started on NSAIDs with significant improvement of the symptoms. CTA was done which rule out PE CRX did not showed acute infectious process. ECHO was done which had normal EF with G2DD. Patient has significantly improved and will be discharge home with NSAID.   Subjective: Patient seen and examined at bedside, patient feeling well. Pain much better responded well to Toradol. Tolerating diet well and ambulating   Discharge Diagnoses:  Atypical chest pain 2/2 to pleurisy and msk pain - patient had viral infection, was coughing for few months due to bronchitis likely causing pleurisy No EKG changes, no clear explanation of mild troponin elevation  Cardiology recommended no further workup ECHO no motion wall abnormality   CTA negative for PE Continue Ibuprofen for 3-4 days Recommended rest for 2-3 days - no heavy lifting  Folow  up with PCP in 1 week   HTN Blood pressure stable  Resume home medications   OSA Continue CPAP  Tobacco abuse  Tobacco cessation discussed  Patient on nicotine replacement at home - continue therapy   DM type 2 - well controlled  Continue home medication  Check finger stick daily - glucose goal < 140 fasting.  Discharge Instructions  You were cared for by a hospitalist during your hospital stay. If you have any questions about your discharge medications or the care you received while you were in the hospital after you are discharged, you can call the unit and asked to speak with the hospitalist on call if the hospitalist that took care of you is not available. Once you are discharged, your primary care physician will handle any further medical issues. Please note that NO REFILLS for any discharge medications will be authorized once you are discharged, as it is imperative that you return to your primary care physician (or establish a relationship with a primary care physician if you do not have one) for your aftercare needs so that they can reassess your need for medications and monitor your lab values.  Discharge Instructions    Call MD for:  difficulty breathing, headache or visual disturbances    Complete by:  As directed    Call MD for:  extreme fatigue    Complete by:  As directed    Call MD for:  hives    Complete by:  As directed    Call MD for:  persistant dizziness or light-headedness    Complete by:  As directed    Call MD for:  persistant nausea  and vomiting    Complete by:  As directed    Call MD for:  redness, tenderness, or signs of infection (pain, swelling, redness, odor or green/yellow discharge around incision site)    Complete by:  As directed    Call MD for:  severe uncontrolled pain    Complete by:  As directed    Call MD for:  temperature >100.4    Complete by:  As directed    Diet - low sodium heart healthy    Complete by:  As directed    Discharge  instructions    Complete by:  As directed    Increase activity slowly    Complete by:  As directed      Allergies as of 07/11/2016      Reactions   Other    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  Pt only wants pain medicine if absolutely necessary.   Levofloxacin Rash   Rash on back (not sun exposed)and hypersensitivity to skin on exposed skin/arm      Medication List    TAKE these medications   amLODipine 5 MG tablet Commonly known as:  NORVASC Take 1 tablet (5 mg total) by mouth daily.   aspirin 81 MG tablet Take 1 tablet (81 mg total) by mouth daily.   blood glucose meter kit and supplies Kit Dispense based on patient and insurance preference. Use up to four times daily as directed. (FOR ICD-9 250.00, 250.01).   clopidogrel 75 MG tablet Commonly known as:  PLAVIX Take 1 tablet (75 mg total) by mouth daily.   ezetimibe 10 MG tablet Commonly known as:  ZETIA Take 1 tablet (10 mg total) by mouth daily.   furosemide 40 MG tablet Commonly known as:  LASIX Take 1 tablet (40 mg total) by mouth 3 (three) times a week.   gabapentin 300 MG capsule Commonly known as:  NEURONTIN Take 3 capsules (900 mg total) by mouth 3 (three) times daily.   glucose blood test strip Commonly known as:  TRUE METRIX BLOOD GLUCOSE TEST Use as instructed   hydrochlorothiazide 25 MG tablet Commonly known as:  HYDRODIURIL Take 1 tablet (25 mg total) by mouth daily.   ibuprofen 800 MG tablet Commonly known as:  ADVIL,MOTRIN Take 1 tablet (800 mg total) by mouth every 8 (eight) hours as needed.   insulin aspart 100 UNIT/ML injection Commonly known as:  novoLOG Inject 20 Units into the skin 3 (three) times daily with meals.   insulin glargine 100 UNIT/ML injection Commonly known as:  LANTUS Inject 0.4 mLs (40 Units total) into the skin 2 (two) times daily.   Insulin Syringes (Disposable) U-100 0.5 ML Misc Take insulin as directed   Diagnosis: Insulin dependent diabetes  mellitus ICD10 250   lisinopril 10 MG tablet Commonly known as:  PRINIVIL,ZESTRIL Take 1 tablet (10 mg total) by mouth 2 (two) times daily.   metFORMIN 500 MG 24 hr tablet Commonly known as:  GLUCOPHAGE-XR TAKE 2 TABLETS BY MOUTH 2 TIMES DAILY WITH A MEAL.   metoprolol 50 MG tablet Commonly known as:  LOPRESSOR Take 1.5 tablets (75 mg total) by mouth 2 (two) times daily.   nicotine 10 MG inhaler Commonly known as:  NICOTROL Inhale 1 cartridge (1 continuous puffing total) into the lungs as needed for smoking cessation.   nitroGLYCERIN 0.4 MG SL tablet Commonly known as:  NITROSTAT Place 1 tablet (0.4 mg total) under the tongue every 5 (five) minutes as  needed for chest pain.   potassium chloride 10 MEQ tablet Commonly known as:  K-DUR Take 1 tablet (10 mEq total) by mouth daily.   rosuvastatin 40 MG tablet Commonly known as:  CRESTOR Take 1 tablet (40 mg total) by mouth daily.   TRUEPLUS LANCETS 28G Misc Use as directed      Follow-up Information    Maren Reamer, MD.   Specialty:  Internal Medicine Contact information: [address] [place] [address] [phone]          Allergies  Allergen Reactions  . 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  Pt only wants pain medicine if absolutely necessary.  . Levofloxacin Rash    Rash on back (not sun exposed)and hypersensitivity to skin on exposed skin/arm    Consultations:  Cardiology [facility]   Procedures/Studies: Dg Chest 2 View  Result Date: 07/09/2016 CLINICAL DATA:  Left rib pain with general chest pain tonight. Persistent cough since 06/23/2016. Shortness of breath. History of diabetes and hypertension. Current smoker. EXAM: CHEST  2 VIEW COMPARISON:  06/23/2016 FINDINGS: Mild interstitial changes in the lung bases suggesting fibrosis. No change since prior study. No focal airspace disease or consolidation in the lungs. No blunting of costophrenic angles. No pneumothorax.  Normal heart size and pulmonary vascularity. Mediastinal contours appear intact. Degenerative changes in the spine. IMPRESSION: Slight fibrosis in the lung bases. No evidence of active pulmonary disease. Electronically Signed   By: Lucienne Capers M.D.   On: 07/09/2016 23:09   Dg Chest 2 View  Result Date: 06/23/2016 CLINICAL DATA:  Shortness of breath. EXAM: CHEST  2 VIEW COMPARISON:  05/24/2016 .  09/01/2015 06/11/2015. FINDINGS: Cardiomegaly. No pulmonary venous congestion. Mild interstitial prominence. Bilateral pleural thickening again noted consistent with scarring. No pleural effusion or pneumothorax . IMPRESSION: 1. Stable cardiomegaly.  No pulmonary venous congestion. 2. Mild bilateral from interstitial prominence. Mild pneumonitis cannot be excluded. Electronically Signed   By: Marcello Moores  Register   On: 06/23/2016 11:22   Ct Angio Chest Pe W And/or Wo Contrast  Result Date: 07/10/2016 CLINICAL DATA:  Left-sided chest pain. Elevated troponin. History of hypertension, diabetes, coronary artery disease, coronary stents, and cardiac catheterization. EXAM: CT ANGIOGRAPHY CHEST WITH CONTRAST TECHNIQUE: Multidetector CT imaging of the chest was performed using the standard protocol during bolus administration of intravenous contrast. Multiplanar CT image reconstructions and MIPs were obtained to evaluate the vascular anatomy. CONTRAST:  100 mL Isovue 370 COMPARISON:  11/26/2015 FINDINGS: Cardiovascular: Satisfactory opacification of the pulmonary arteries to the segmental level. No evidence of pulmonary embolism. Normal heart size. No pericardial effusion. Coronary artery stents. Normal caliber thoracic aorta. No aortic dissection. Mediastinum/Nodes: No enlarged mediastinal, hilar, or axillary lymph nodes. Thyroid gland, trachea, and esophagus demonstrate no significant findings. Lungs/Pleura: Mild atelectasis in the lung bases. Left upper lobe consolidation seen previously has resolved. No focal  consolidation or airspace disease today. Airways are patent. No pleural effusions. No pneumothorax. Upper Abdomen: No acute abnormality. Musculoskeletal: Degenerative changes in the spine. No destructive bone lesions. Review of the MIP images confirms the above findings. IMPRESSION: No evidence of significant pulmonary embolus. No evidence of active pulmonary disease. Electronically Signed   By: Lucienne Capers M.D.   On: 07/10/2016 00:32   ECHO 07/11/11 ------------------------------------------------------------------- Study Conclusions  - Left ventricle: The cavity size was normal. Wall thickness was   increased in a pattern of mild LVH. Systolic function was normal.  The estimated ejection fraction was in the range of 60% to 65%.   Features are consistent with a pseudonormal left ventricular   filling pattern, with concomitant abnormal relaxation and   increased filling pressure (grade 2 diastolic dysfunction). - Left atrium: The atrium was mildly dilated. - Impressions: Technically limited study due to poor sound wave   transmission. No obvious wall motion abnormalities with Definity   contrast imaging.  Impressions:  - Technically limited study due to poor sound wave transmission. No   obvious wall motion abnormalities with Definity contrast imaging.    Discharge Exam: Vitals:   07/11/16 0900 07/11/16 1229  BP: 126/70 (!) 155/93  Pulse: 71 72  Resp:  20  Temp: 98.2 F (36.8 C) 98.6 F (37 C)   Vitals:   07/10/16 2158 07/11/16 0606 07/11/16 0900 07/11/16 1229  BP:  103/70 126/70 (!) 155/93  Pulse: 77 66 71 72  Resp: '18 18  20  ' Temp:  98 F (36.7 C) 98.2 F (36.8 C) 98.6 F (37 C)  TempSrc:  Oral Oral Oral  SpO2: 96% 94% 97% 94%  Weight:  130 kg (286 lb 11.2 oz)    Height:        General: Pt is alert, awake, not in acute distress Cardiovascular: RRR, S1/S2 +, no rubs, no gallops, chest wall tenderness tender to touch Respiratory: CTA bilaterally, no  wheezing, no rhonchi Abdominal: Soft, NT, ND, bowel sounds + Extremities: no edema, no cyanosis    The results of significant diagnostics from this hospitalization (including imaging, microbiology, ancillary and laboratory) are listed below for reference.     Microbiology: Recent Results (from the past 240 hour(s))  Culture, blood (Routine X 2) w Reflex to ID Panel     Status: None (Preliminary result)   Collection Time: 07/10/16  4:19 AM  Result Value Ref Range Status   Specimen Description BLOOD LEFT ARM  Final   Special Requests BOTTLES DRAWN AEROBIC AND ANAEROBIC 4ML  Final   Culture NO GROWTH 1 DAY  Final   Report Status PENDING  Incomplete  Culture, blood (Routine X 2) w Reflex to ID Panel     Status: None (Preliminary result)   Collection Time: 07/10/16  4:29 AM  Result Value Ref Range Status   Specimen Description BLOOD LEFT ARM  Final   Special Requests IN PEDIATRIC BOTTLE 4ML  Final   Culture NO GROWTH 1 DAY  Final   Report Status PENDING  Incomplete     Labs: BNP (last 3 results)  Recent Labs  04/17/16 0947 06/15/16 1037 07/09/16 2350  BNP 106.1* 23.3 68.0   Basic Metabolic Panel:  Recent Labs Lab 07/09/16 2300 07/11/16 0519  NA 140 135  K 3.9 3.9  CL 103 104  CO2 24 24  GLUCOSE 168* 198*  BUN 16 17  CREATININE 1.03 0.69  CALCIUM 8.9 8.4*   Liver Function Tests: No results for input(s): AST, ALT, ALKPHOS, BILITOT, PROT, ALBUMIN in the last 168 hours. No results for input(s): LIPASE, AMYLASE in the last 168 hours. No results for input(s): AMMONIA in the last 168 hours. CBC:  Recent Labs Lab 07/09/16 2300 07/10/16 1002 07/11/16 0519  WBC 15.1* 14.7* 13.5*  NEUTROABS  --  9.6*  --   HGB 14.1 13.9 12.6*  HCT 40.9 40.9 37.3*  MCV 88.9 88.9 89.9  PLT 240 240 202   Cardiac Enzymes:  Recent Labs Lab 07/10/16 0308 07/10/16 1002 07/10/16 1456  TROPONINI 0.53* 0.40* 0.30*  BNP: Invalid input(s): POCBNP CBG:  Recent Labs Lab  07/10/16 1055 07/10/16 1644 07/10/16 2043 07/11/16 0508 07/11/16 1129  GLUCAP 211* 185* 250* 193* 222*   D-Dimer No results for input(s): DDIMER in the last 72 hours. Hgb A1c  Recent Labs  07/10/16 0308  HGBA1C 7.8*   Lipid Profile  Recent Labs  07/10/16 0416  CHOL 102  HDL 35*  LDLCALC 49  TRIG 91  CHOLHDL 2.9   Thyroid function studies No results for input(s): TSH, T4TOTAL, T3FREE, THYROIDAB in the last 72 hours.  Invalid input(s): FREET3 Anemia work up No results for input(s): VITAMINB12, FOLATE, FERRITIN, TIBC, IRON, RETICCTPCT in the last 72 hours. Urinalysis    Component Value Date/Time   COLORURINE YELLOW 07/10/2016 1521   APPEARANCEUR CLEAR 07/10/2016 1521   LABSPEC 1.042 (H) 07/10/2016 1521   PHURINE 5.0 07/10/2016 1521   GLUCOSEU NEGATIVE 07/10/2016 1521   HGBUR NEGATIVE 07/10/2016 1521   BILIRUBINUR NEGATIVE 07/10/2016 1521   KETONESUR NEGATIVE 07/10/2016 1521   PROTEINUR 30 (A) 07/10/2016 1521   NITRITE NEGATIVE 07/10/2016 1521   LEUKOCYTESUR NEGATIVE 07/10/2016 1521   Sepsis Labs Invalid input(s): PROCALCITONIN,  WBC,  LACTICIDVEN Microbiology Recent Results (from the past 240 hour(s))  Culture, blood (Routine X 2) w Reflex to ID Panel     Status: None (Preliminary result)   Collection Time: 07/10/16  4:19 AM  Result Value Ref Range Status   Specimen Description BLOOD LEFT ARM  Final   Special Requests BOTTLES DRAWN AEROBIC AND ANAEROBIC 4ML  Final   Culture NO GROWTH 1 DAY  Final   Report Status PENDING  Incomplete  Culture, blood (Routine X 2) w Reflex to ID Panel     Status: None (Preliminary result)   Collection Time: 07/10/16  4:29 AM  Result Value Ref Range Status   Specimen Description BLOOD LEFT ARM  Final   Special Requests IN PEDIATRIC BOTTLE 4ML  Final   Culture NO GROWTH 1 DAY  Final   Report Status PENDING  Incomplete     Time coordinating discharge: Over 30 minutes  SIGNED:  Chipper Oman, MD  [facility] 07/11/2016, 1:21 PM Pager   If 7PM-7AM, please contact night-coverage [email or web address] Password [email or web address]

## 2016-07-11 NOTE — Progress Notes (Signed)
Patient has a red area that looks red and irritated. It was left from tape placement by lab. Applied a cold washcloth on it and will ask day shift nurse to continue to monitor site.

## 2016-07-11 NOTE — Progress Notes (Signed)
Patient states that his home CPAP mask is broken and he can no longer uses it. States respiratory therapy told him that he could keep the mask attachment from hospital CPAP and take it home.

## 2016-07-13 ENCOUNTER — Ambulatory Visit: Payer: Self-pay | Admitting: Internal Medicine

## 2016-07-13 MED FILL — IBUPROFEN 800 MG TABLET: 800 | 30 days supply | Qty: 30 | Fill #0

## 2016-07-14 ENCOUNTER — Telehealth: Payer: Self-pay | Admitting: *Deleted

## 2016-07-14 NOTE — Telephone Encounter (Signed)
Will defer to Dr. Julien NordmannLangeland since she just recently saw him but he may benefit from sliding scale?

## 2016-07-14 NOTE — Telephone Encounter (Signed)
Pt states his CBG was 428 on last night. He stated last night since we were closed, he called [facility] pharmacy and told the  Pharmacist about his blood sugar. They  told him to take extra Novalog and recheck sugar until it gets to 200.  This morning his blood sugar was 168. Please advise him what to do if his sugar get too high. At what level should he seek medical attention.

## 2016-07-14 NOTE — Telephone Encounter (Signed)
Called pt, confirmed dob.  Sugars better after he treated himself w/ novolog 20units x 2 until his sugars. When went to bed cbg 180s afterwards aroudn 1am.  Per pt, prior to dinner., his cbg 180s.  Recd watch his carb intake, recd novolog ss prior to meals.  If his for cbg > 300 - novolog 30units, if >400., than 40units.  If all after meals w/ elevated cbg >300s, than to dose himself w/ novolol 10 units every hour until his cbg is <200s.  He states he understood, has hosp f/u w/ me  Next week.

## 2016-07-15 LAB — CULTURE, BLOOD (ROUTINE X 2)
Culture: NO GROWTH
Culture: NO GROWTH

## 2016-07-21 ENCOUNTER — Encounter: Payer: Self-pay | Admitting: Internal Medicine

## 2016-07-21 ENCOUNTER — Ambulatory Visit: Payer: Self-pay | Attending: Internal Medicine | Admitting: Internal Medicine

## 2016-07-21 VITALS — BP 137/86 | HR 90 | Temp 98.2°F | Resp 16 | Wt 282.8 lb

## 2016-07-21 DIAGNOSIS — I10 Essential (primary) hypertension: Secondary | ICD-10-CM | POA: Insufficient documentation

## 2016-07-21 DIAGNOSIS — F17210 Nicotine dependence, cigarettes, uncomplicated: Secondary | ICD-10-CM | POA: Insufficient documentation

## 2016-07-21 DIAGNOSIS — K2100 Gastro-esophageal reflux disease with esophagitis, without bleeding: Secondary | ICD-10-CM

## 2016-07-21 DIAGNOSIS — Z720 Tobacco use: Secondary | ICD-10-CM

## 2016-07-21 DIAGNOSIS — E1142 Type 2 diabetes mellitus with diabetic polyneuropathy: Secondary | ICD-10-CM

## 2016-07-21 DIAGNOSIS — Z794 Long term (current) use of insulin: Secondary | ICD-10-CM | POA: Insufficient documentation

## 2016-07-21 DIAGNOSIS — G4733 Obstructive sleep apnea (adult) (pediatric): Secondary | ICD-10-CM | POA: Insufficient documentation

## 2016-07-21 DIAGNOSIS — Z5189 Encounter for other specified aftercare: Secondary | ICD-10-CM | POA: Insufficient documentation

## 2016-07-21 DIAGNOSIS — E119 Type 2 diabetes mellitus without complications: Secondary | ICD-10-CM | POA: Insufficient documentation

## 2016-07-21 DIAGNOSIS — R091 Pleurisy: Secondary | ICD-10-CM

## 2016-07-21 DIAGNOSIS — Z79899 Other long term (current) drug therapy: Secondary | ICD-10-CM | POA: Insufficient documentation

## 2016-07-21 DIAGNOSIS — Z7984 Long term (current) use of oral hypoglycemic drugs: Secondary | ICD-10-CM | POA: Insufficient documentation

## 2016-07-21 DIAGNOSIS — R32 Unspecified urinary incontinence: Secondary | ICD-10-CM | POA: Insufficient documentation

## 2016-07-21 DIAGNOSIS — Z7982 Long term (current) use of aspirin: Secondary | ICD-10-CM | POA: Insufficient documentation

## 2016-07-21 DIAGNOSIS — I2510 Atherosclerotic heart disease of native coronary artery without angina pectoris: Secondary | ICD-10-CM | POA: Insufficient documentation

## 2016-07-21 LAB — PSA: PSA: 0.3 ng/mL (ref <=4.0)

## 2016-07-21 LAB — GLUCOSE, POCT (MANUAL RESULT ENTRY): POC GLUCOSE: 88 mg/dL (ref 70–99)

## 2016-07-21 MED ORDER — METOPROLOL TARTRATE 50 MG PO TABS: 50.0000 mg | ORAL_TABLET | Freq: Two times a day (BID) | ORAL | 11 refills | Status: DC

## 2016-07-21 MED ORDER — FAMOTIDINE 20 MG PO TABS: 20.0000 mg | ORAL_TABLET | Freq: Two times a day (BID) | ORAL | 3 refills | Status: DC

## 2016-07-21 MED ORDER — OXYBUTYNIN CHLORIDE ER 5 MG PO TB24: 5.0000 mg | ORAL_TABLET | Freq: Every day | ORAL | 3 refills | Status: DC

## 2016-07-21 MED ORDER — INSULIN GLARGINE 100 UNIT/ML ~~LOC~~ SOLN: 38.0000 [IU] | Freq: Two times a day (BID) | SUBCUTANEOUS | 3 refills | Status: DC

## 2016-07-21 MED ORDER — NICOTINE POLACRILEX 4 MG MT GUM: 4.0000 mg | CHEWING_GUM | OROMUCOSAL | 3 refills | Status: DC | PRN

## 2016-07-21 MED FILL — FAMOTIDINE 20 MG TABLET: 20 | 30 days supply | Qty: 60 | Fill #0

## 2016-07-21 MED FILL — METOPROLOL TARTRATE 50 MG T: 50 | 45 days supply | Qty: 90 | Fill #0

## 2016-07-21 MED FILL — OXYBUTYNIN CL ER 5 MG TAB: 5 | 30 days supply | Qty: 30 | Fill #0

## 2016-07-21 NOTE — Progress Notes (Signed)
Marc Walker, is a 46 y.o. male  WGY:[id number]  XBL:[id number]  DOB - [birth year]  Chief Complaint  Patient presents with  . Hospitalization Follow-up        Subjective:   Marc Walker is a 46 y.o. male here today for a follow up visit, w/ hx of cad, htn, dm2, recent hospitalization 1/11-13/18, felt 2nd to pleurisy and discharged w/ aleve 818m. Per pt, he note it only helped relieve pain somewhat, but still with "deep pain". Does have bad heart burn, but mainly in esophogeal region, takes a lot of rolaids/tums frequently.  This am, he woke up at around 4am , and noted am cbg 63.  Pt notes increasing urinary incontinence at night. Will wake up sometimes completely soaked.  He notes bedwetting until 46yo in past. Denies difficulty initiating urine stream or having problems w/ complete emptying.  Patient has No headache, No chest pain, No abdominal pain - No Nausea, No new weakness tingling or numbness, No Cough - SOB.  No problems updated.  ALLERGIES: Allergies  Allergen Reactions  . 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  Pt only wants pain medicine if absolutely necessary.  . Levofloxacin Rash    Rash on back (not sun exposed)and hypersensitivity to skin on exposed skin/arm    PAST MEDICAL HISTORY: Past Medical History:  Diagnosis Date  . Coronary artery disease   . Diabetes mellitus without complication ([place]   . Heartburn   . High cholesterol   . Hyperlipidemia associated with type 2 diabetes mellitus ([place]   . Hypertension   . [place] ([place]) [place]2009   [place], last one 02[place]2014   . Morbid obesity ([place] 07/06/2016  . OSA (obstructive sleep apnea) 07/06/2016   Moderate with AHI 21.8/hr by PSG 08/2012 now on CPAP  . Thyroid disease    past history    MEDICATIONS AT HOME: Prior to Admission medications   Medication Sig Start Date End Date  Taking? Authorizing Provider  amLODipine (NORVASC) 5 MG tablet Take 1 tablet (5 mg total) by mouth daily. 05/29/16 08/27/16  PFay Records MD  aspirin 81 MG tablet Take 1 tablet (81 mg total) by mouth daily. 07/06/16   DMaren Reamer MD  blood glucose meter kit and supplies KIT Dispense based on patient and insurance preference. Use up to four times daily as directed. (FOR ICD-9 250.00, 250.01). 09/17/15   Ripudeep KKrystal Eaton MD  clopidogrel (PLAVIX) 75 MG tablet Take 1 tablet (75 mg total) by mouth daily. 12/02/15   PFay Records MD  ezetimibe (ZETIA) 10 MG tablet Take 1 tablet (10 mg total) by mouth daily. 06/18/16 09/16/16  PFay Records MD  famotidine (PEPCID) 20 MG tablet Take 1 tablet (20 mg total) by mouth 2 (two) times daily. 07/21/16   DMaren Reamer MD  furosemide (LASIX) 40 MG tablet Take 1 tablet (40 mg total) by mouth 3 (three) times a week. 05/25/16 08/23/16  PFay Records MD  gabapentin (NEURONTIN) 300 MG capsule Take 3 capsules (900 mg total) by mouth 3 (three) times daily. 07/06/16   DMaren Reamer MD  glucose blood (TRUE METRIX BLOOD GLUCOSE TEST) test strip Use as instructed 03/17/16   DMaren Reamer MD  hydrochlorothiazide (HYDRODIURIL) 25 MG tablet Take 1 tablet (25 mg total) by mouth daily. 07/06/16  Maren Reamer, MD  insulin aspart (NOVOLOG) 100 UNIT/ML injection Inject 20 Units into the skin 3 (three) times daily with meals. 07/06/16   Maren Reamer, MD  insulin glargine (LANTUS) 100 UNIT/ML injection Inject 0.38 mLs (38 Units total) into the skin 2 (two) times daily. 07/21/16   Maren Reamer, MD  Insulin Syringes, Disposable, U-100 0.5 ML MISC Take insulin as directed   Diagnosis: Insulin dependent diabetes mellitus ICD10 250 11/04/15   Maren Reamer, MD  lisinopril (PRINIVIL,ZESTRIL) 10 MG tablet Take 1 tablet (10 mg total) by mouth 2 (two) times daily. 09/30/15   Imogene Burn, PA-C  metFORMIN (GLUCOPHAGE-XR) 500 MG 24 hr tablet TAKE 2 TABLETS BY MOUTH 2 TIMES DAILY WITH  A MEAL. 05/01/16   Maren Reamer, MD  metoprolol (LOPRESSOR) 50 MG tablet Take 1 tablet (50 mg total) by mouth 2 (two) times daily. 07/21/16 10/19/16  Maren Reamer, MD  nicotine (NICOTROL) 10 MG inhaler Inhale 1 cartridge (1 continuous puffing total) into the lungs as needed for smoking cessation. 09/30/15   Imogene Burn, PA-C  nicotine polacrilex (NICORETTE) 4 MG gum Take 1 each (4 mg total) by mouth as needed for smoking cessation. 07/21/16   Maren Reamer, MD  nitroGLYCERIN (NITROSTAT) 0.4 MG SL tablet Place 1 tablet (0.4 mg total) under the tongue every 5 (five) minutes as needed for chest pain. 09/17/15   Ripudeep Krystal Eaton, MD  oxybutynin (DITROPAN XL) 5 MG 24 hr tablet Take 1 tablet (5 mg total) by mouth at bedtime. 07/21/16   Maren Reamer, MD  potassium chloride (K-DUR) 10 MEQ tablet Take 1 tablet (10 mEq total) by mouth daily. 05/25/16 08/23/16  Fay Records, MD  rosuvastatin (CRESTOR) 40 MG tablet Take 1 tablet (40 mg total) by mouth daily. 07/06/16 10/04/16  Maren Reamer, MD  TRUEPLUS LANCETS 28G MISC Use as directed 03/17/16   Maren Reamer, MD     Objective:   Vitals:   07/21/16 1500  BP: 137/86  Pulse: 90  Resp: 16  Temp: 98.2 F (36.8 C)  TempSrc: Oral  SpO2: 95%  Weight: 282 lb 12.8 oz (128.3 kg)    Exam General appearance : Awake, alert, not in any distress. Speech Clear. Not toxic looking, morbid obese, pleasant. HEENT: Atraumatic and Normocephalic, pupils equally reactive to light. Neck: supple, no JVD.  Chest:Good air entry bilaterally, no added sounds.  Mild ttp to deep palpation left upper chest /ribs. CVS: S1 S2 regular, no murmurs/gallups or rubs. Abdomen: Bowel sounds active, obese, Non tender and not distended with no gaurding, rigidity or rebound. Extremities: B/L Lower Ext shows no edema, both legs are warm to touch Neurology: Awake alert, and oriented X 3, CN II-XII grossly intact, Non focal Skin:No Rash  Data Review Lab Results  Component  Value Date   HGBA1C 7.8 (H) 07/10/2016   HGBA1C 7.7 07/06/2016   HGBA1C 8.1 03/23/2016    Depression screen PHQ 2/9 07/21/2016 07/06/2016 03/23/2016 03/23/2016 11/28/2015  Decreased Interest 0 0 0 0 0  Down, Depressed, Hopeless 0 0 0 0 0  PHQ - 2 Score 0 0 0 0 0  Altered sleeping - - - - 3  Tired, decreased energy - - - - 3  Change in appetite - - - - 3  Feeling bad or failure about yourself  - - - - 0  Trouble concentrating - - - - 0  Moving slowly or fidgety/restless - - - -  1  Suicidal thoughts - - - - 0  PHQ-9 Score - - - - 10  Difficult doing work/chores - - - - Somewhat difficult      Assessment & Plan   1. Essential hypertension decent controlled, has actually been off his metoprolol 75bid for at least 1 month now. - would recd picking up rx, but will reduce it to metoprolol 50bid, recd in setting of his cad. - close f/u w/ cards  2. Pleurisy  Vs gerd w/ esophogitits Aleve did not help for long, and long term NSAID contraindicated in this patient in  Setting of cad -will trial pepcid.,   3. DM type 2 with diabetic peripheral neuropathy ([place]) Actually low 60 this am, concern for hypoglycemic events. Will decrease lantus to 38units bid, continue novololg 20units qac Asked pt to chk cbg in am, and before/after meals to see if may need bolus insulin adjustments - [facility]. For dm check.  4. Gastroesophageal reflux disease with esophagitis pepcid 20bid started, tums/rolaids prn.  5. OSA (obstructive sleep apnea) Now has cpap mask and using, encouraged use  6. Tobacco abuse Down to 1/2 ppd, recd total cessation. He has nicotine inhaler at home as well.  Total cessation recd, nicorette gum trial added as well  7. Morbid obesity ([place]) Low carb diet again discussed, increase activity. Wrote him a letter to take to gym to see if could get discount.  8. Urinary incontinence, unspecified type - PSA - does not sound like bph on questioning. - trial  ditropan 5qhs to help w/ urinary incontinence.     Patient have been counseled extensively about nutrition and exercise  Return in about 3 months (around 10/19/2016), or if symptoms worsen or fail to improve.  The patient was given clear instructions to go to ER or return to medical center if symptoms don't improve, worsen or new problems develop. The patient verbalized understanding. The patient was told to call to get lab results if they haven't heard anything in the next week.   This note has been created with Surveyor, quantity. Any transcriptional errors are unintentional.   Maren Reamer, MD, [facility] [place], [place]   07/21/2016, 3:36 PM

## 2016-07-21 NOTE — Addendum Note (Signed)
Addended by: Carolynne EdouardPOLLOCK, JAY'A R on: 07/21/2016 04:10 PM   Modules accepted: Orders

## 2016-07-21 NOTE — Patient Instructions (Addendum)
- [facility] /dm chk.   Check blood sugars on waking up daily., and at least 1-2 times rest of day.    Also check blood sugars about 2 hours after a meal and do this after different meals by rotation  Recommended blood sugar levels on waking up is 90-130 and about 2 hours after meal is 130-160  Please bring your blood sugar monitor to each visit, thank you  Restart exercise  Continue cholesterol med.   - please call us if you have any questions   - metoprolol tartrate 50 2x day - pick it up.   Food Choices for Gastroesophageal Reflux Disease, Adult When you have gastroesophageal reflux disease (GERD), the foods you eat and your eating habits are very important. Choosing the right foods can help ease your discomfort. What guidelines do I need to follow?  Choose fruits, vegetables, whole grains, and low-fat dairy products.  Choose low-fat meat, fish, and poultry.  Limit fats such as oils, salad dressings, butter, nuts, and avocado.  Keep a food diary. This helps you identify foods that cause symptoms.  Avoid foods that cause symptoms. These may be different for everyone.  Eat small meals often instead of 3 large meals a day.  Eat your meals slowly, in a place where you are relaxed.  Limit fried foods.  Cook foods using methods other than frying.  Avoid drinking alcohol.  Avoid drinking large amounts of liquids with your meals.  Avoid bending over or lying down until 2-3 hours after eating. What foods are not recommended? These are some foods and drinks that may make your symptoms worse: Vegetables  Tomatoes. Tomato juice. Tomato and spaghetti sauce. Chili peppers. Onion and garlic. Horseradish. Fruits  Oranges, grapefruit, and lemon (fruit and juice). Meats  High-fat meats, fish, and poultry. This includes hot dogs, ribs, ham, sausage, salami, and bacon. Dairy  Whole milk and chocolate milk. Sour cream. Cream. Butter. Ice cream. Cream  cheese. Drinks  Coffee and tea. Bubbly (carbonated) drinks or energy drinks. Condiments  Hot sauce. Barbecue sauce. Sweets/Desserts  Chocolate and cocoa. Donuts. Peppermint and spearmint. Fats and Oils  High-fat foods. This includes [place] fries and potato chips. Other  Vinegar. Strong spices. This includes black pepper, white pepper, red pepper, cayenne, curry powder, cloves, ginger, and chili powder. The items listed above may not be a complete list of foods and drinks to avoid. Contact your dietitian for more information.  This information is not intended to replace advice given to you by your health care provider. Make sure you discuss any questions you have with your health care provider. Document Released: 12/15/2011 Document Revised: 11/21/2015 Document Reviewed: 04/19/2013 Elsevier Interactive Patient Education  2017 [employer]   -   Steps to Quit Smoking Smoking tobacco can be bad for your health. It can also affect almost every organ in your body. Smoking puts you and people around you at risk for many serious long-lasting (chronic) diseases. Quitting smoking is hard, but it is one of the best things that you can do for your health. It is never too late to quit. What are the benefits of quitting smoking? When you quit smoking, you lower your risk for getting serious diseases and conditions. They can include:  Lung cancer or lung disease.  Heart disease.  Stroke.  Heart attack.  Not being able to have children (infertility).  Weak bones (osteoporosis) and broken bones (fractures). If you have coughing, wheezing, and shortness of breath,  those symptoms may get better when you quit. You may also get sick less often. If you are pregnant, quitting smoking can help to lower your chances of having a baby of low birth weight. What can I do to help me quit smoking? Talk with your doctor about what can help you quit smoking. Some things you can do (strategies)  include:  Quitting smoking totally, instead of slowly cutting back how much you smoke over a period of time.  Going to in-person counseling. You are more likely to quit if you go to many counseling sessions.  Using resources and support systems, such as:  Online chats with a Veterinary surgeon.  Phone quitlines.  Printed Materials engineer.  Support groups or group counseling.  Text messaging programs.  Mobile phone apps or applications.  Taking medicines. Some of these medicines may have nicotine in them. If you are pregnant or breastfeeding, do not take any medicines to quit smoking unless your doctor says it is okay. Talk with your doctor about counseling or other things that can help you. Talk with your doctor about using more than one strategy at the same time, such as taking medicines while you are also going to in-person counseling. This can help make quitting easier. What things can I do to make it easier to quit? Quitting smoking might feel very hard at first, but there is a lot that you can do to make it easier. Take these steps:  Talk to your family and friends. Ask them to support and encourage you.  Call phone quitlines, reach out to support groups, or work with a Veterinary surgeon.  Ask people who smoke to not smoke around you.  Avoid places that make you want (trigger) to smoke, such as:  Bars.  Parties.  Smoke-break areas at work.  Spend time with people who do not smoke.  Lower the stress in your life. Stress can make you want to smoke. Try these things to help your stress:  Getting regular exercise.  Deep-breathing exercises.  Yoga.  Meditating.  Doing a body scan. To do this, close your eyes, focus on one area of your body at a time from head to toe, and notice which parts of your body are tense. Try to relax the muscles in those areas.  Download or buy apps on your mobile phone or tablet that can help you stick to your quit plan. There are many free apps, such  as QuitGuide from the [employer] developer for Disease Control and Prevention). You can find more support from smokefree.gov and other websites. This information is not intended to replace advice given to you by your health care provider. Make sure you discuss any questions you have with your health care provider. Document Released: 04/11/2009 Document Revised: 02/11/2016 Document Reviewed: 10/30/2014 Elsevier Interactive Patient Education  2017 [employer].

## 2016-07-22 ENCOUNTER — Telehealth: Payer: Self-pay

## 2016-07-22 NOTE — Telephone Encounter (Signed)
Contacted pt to go over lab results pt is aware of results and doesn't have any questions or concerns 

## 2016-07-24 ENCOUNTER — Emergency Department (INDEPENDENT_AMBULATORY_CARE_PROVIDER_SITE_OTHER)
Admission: EM | Admit: 2016-07-24 | Discharge: 2016-07-24 | Disposition: A | Discharge status: Home / Self Care | Payer: Self-pay | Source: Home / Self Care | Attending: Family Medicine | Admitting: Family Medicine

## 2016-07-24 ENCOUNTER — Encounter: Payer: Self-pay | Admitting: *Deleted

## 2016-07-24 DIAGNOSIS — H5789 Other specified disorders of eye and adnexa: Secondary | ICD-10-CM

## 2016-07-24 DIAGNOSIS — H18822 Corneal disorder due to contact lens, left eye: Secondary | ICD-10-CM

## 2016-07-24 MED ORDER — POLYMYXIN B-TRIMETHOPRIM 10000-0.1 UNIT/ML-% OP SOLN: 1.0000 [drp] | OPHTHALMIC | 0 refills | Status: DC

## 2016-07-24 NOTE — ED Triage Notes (Signed)
Patient reports having Capsaicin on his hands then rubbing his eyes. Both eyes are burning, left is more severe. Wears glasses.

## 2016-07-24 NOTE — Discharge Instructions (Signed)
°  The burning in your eyes and skin should gradually improve.  You may use cool water to help continue flushing your eyes.  Avoid getting the same medication in your eyes again as it can cause more damage to your eye.  If it does occur again, it is recommended you gentle flush your eyes with cool water in a sink or shower, you may even use bottled water if you are not near a restroom.  You should flush your eyes for about 5 minutes.  Avoid rubbing your eyes as this can cause scratches to your eyes.

## 2016-07-24 NOTE — ED Provider Notes (Signed)
CSN: [id number]     Arrival date & time 07/24/16  1531 History   First MD Initiated Contact with Patient 07/24/16 1535     Chief Complaint  Patient presents with  . Eye Problem   (Consider location/radiation/quality/duration/timing/severity/associated sxs/prior Treatment) HPI  Marc Walker is a 46 y.o. male presenting to UC with c/o sudden onset bilateral eye pain that is burning.  Pt notes he put capsaicin cream on his hands to help with diabetic nerve pain. He allowed the cream to dry but had an itch on his face and rubbed his eyes, causing severe burning.  Incident happened just PTA. Pt notes he was in a parking lot and came directly to UC. He was not able to rinses his eyes PTA.  He wears glasses but no contacts.  Pain is worse in Left eye, 10/10.    Past Medical History:  Diagnosis Date  . Coronary artery disease   . Diabetes mellitus without complication ([place])   . Heartburn   . High cholesterol   . Hyperlipidemia associated with type 2 diabetes mellitus ([place])   . Hypertension   . MI (myocardial infarction) 07/2009   [place] in [place], [place] for last 2, 1st one in [place], [place]; total of 6 stents, last one 02/2014   . Morbid obesity ([place]) 07/06/2016  . OSA (obstructive sleep apnea) 07/06/2016   Moderate with AHI 21.8/hr by PSG 08/2012 now on CPAP  . Thyroid disease    past history   Past Surgical History:  Procedure Laterality Date  . APPENDECTOMY    . CARDIAC CATHETERIZATION N/A 09/16/2015   Procedure: Left Heart Cath and Coronary Angiography;  Surgeon: Lorretta Harp, MD;  Location: [facility];  Service: Cardiovascular;  Laterality: N/A;  . CARDIAC CATHETERIZATION N/A 09/16/2015   Procedure: Coronary Stent Intervention;  Surgeon: Lorretta Harp, MD;  Location: [facility];  Service: Cardiovascular;  Laterality: N/A;  . CORONARY STENT PLACEMENT     Family History  Problem Relation Age of Onset  . Adopted: Yes  . Diabetes Maternal Grandmother     Social History  Substance Use Topics  . Smoking status: Current Every Day Smoker    Packs/day: 1.00    Years: 33.00    Types: Cigarettes  . Smokeless tobacco: Never Used  . Alcohol use No    Review of Systems  Eyes: Positive for photophobia, pain, discharge (watery), redness, itching and visual disturbance (due to watery eyes).  Skin: Negative for color change and wound.  Neurological: Negative for dizziness and light-headedness.    Allergies  Other and Levofloxacin  Home Medications   Prior to Admission medications   Medication Sig Start Date End Date Taking? Authorizing Provider  amLODipine (NORVASC) 5 MG tablet Take 1 tablet (5 mg total) by mouth daily. 05/29/16 08/27/16  Fay Records, MD  aspirin 81 MG tablet Take 1 tablet (81 mg total) by mouth daily. 07/06/16   Maren Reamer, MD  blood glucose meter kit and supplies KIT Dispense based on patient and insurance preference. Use up to four times daily as directed. (FOR ICD-9 250.00, 250.01). 09/17/15   Ripudeep Krystal Eaton, MD  clopidogrel (PLAVIX) 75 MG tablet Take 1 tablet (75 mg total) by mouth daily. 12/02/15   Fay Records, MD  ezetimibe (ZETIA) 10 MG tablet Take 1 tablet (10 mg total) by mouth daily. 06/18/16 09/16/16  Fay Records, MD  famotidine (PEPCID) 20 MG tablet Take 1 tablet (20  mg total) by mouth 2 (two) times daily. 07/21/16   Maren Reamer, MD  furosemide (LASIX) 40 MG tablet Take 1 tablet (40 mg total) by mouth 3 (three) times a week. 05/25/16 08/23/16  Fay Records, MD  gabapentin (NEURONTIN) 300 MG capsule Take 3 capsules (900 mg total) by mouth 3 (three) times daily. 07/06/16   Maren Reamer, MD  glucose blood (TRUE METRIX BLOOD GLUCOSE TEST) test strip Use as instructed 03/17/16   Maren Reamer, MD  hydrochlorothiazide (HYDRODIURIL) 25 MG tablet Take 1 tablet (25 mg total) by mouth daily. 07/06/16   Maren Reamer, MD  insulin aspart (NOVOLOG) 100 UNIT/ML injection Inject 20 Units into the skin 3 (three) times  daily with meals. 07/06/16   Maren Reamer, MD  insulin glargine (LANTUS) 100 UNIT/ML injection Inject 0.38 mLs (38 Units total) into the skin 2 (two) times daily. 07/21/16   Maren Reamer, MD  Insulin Syringes, Disposable, U-100 0.5 ML MISC Take insulin as directed   Diagnosis: Insulin dependent diabetes mellitus ICD10 250 11/04/15   Maren Reamer, MD  lisinopril (PRINIVIL,ZESTRIL) 10 MG tablet Take 1 tablet (10 mg total) by mouth 2 (two) times daily. 09/30/15   Imogene Burn, PA-C  metFORMIN (GLUCOPHAGE-XR) 500 MG 24 hr tablet TAKE 2 TABLETS BY MOUTH 2 TIMES DAILY WITH A MEAL. 05/01/16   Maren Reamer, MD  metoprolol (LOPRESSOR) 50 MG tablet Take 1 tablet (50 mg total) by mouth 2 (two) times daily. 07/21/16 10/19/16  Maren Reamer, MD  nicotine (NICOTROL) 10 MG inhaler Inhale 1 cartridge (1 continuous puffing total) into the lungs as needed for smoking cessation. 09/30/15   Imogene Burn, PA-C  nicotine polacrilex (NICORETTE) 4 MG gum Take 1 each (4 mg total) by mouth as needed for smoking cessation. 07/21/16   Maren Reamer, MD  nitroGLYCERIN (NITROSTAT) 0.4 MG SL tablet Place 1 tablet (0.4 mg total) under the tongue every 5 (five) minutes as needed for chest pain. 09/17/15   Ripudeep Krystal Eaton, MD  oxybutynin (DITROPAN XL) 5 MG 24 hr tablet Take 1 tablet (5 mg total) by mouth at bedtime. 07/21/16   Maren Reamer, MD  potassium chloride (K-DUR) 10 MEQ tablet Take 1 tablet (10 mEq total) by mouth daily. 05/25/16 08/23/16  Fay Records, MD  rosuvastatin (CRESTOR) 40 MG tablet Take 1 tablet (40 mg total) by mouth daily. 07/06/16 10/04/16  Maren Reamer, MD  trimethoprim-polymyxin b (POLYTRIM) ophthalmic solution Place 1 drop into the left eye every 4 (four) hours. For 5 days 07/24/16   Noland Fordyce, PA-C  TRUEPLUS LANCETS 28G MISC Use as directed 03/17/16   Maren Reamer, MD   Meds Ordered and Administered this Visit  Medications - No data to display  BP 141/85 (BP Location: Right Arm)    Pulse 86   Temp 97.7 F (36.5 C) (Oral)   Resp 18   SpO2 99%  No data found.   Physical Exam  Constitutional: He is oriented to person, place, and time. He appears well-developed and well-nourished.  HENT:  Head: Normocephalic and atraumatic.  Eyes: EOM are normal. Pupils are equal, round, and reactive to light. Lids are everted and swept, no foreign bodies found. Right conjunctiva is injected. Right conjunctiva has no hemorrhage. Left conjunctiva is injected. Left conjunctiva has a hemorrhage.    Erythema to upper eyelids, watery eyes.   Neck: Normal range of motion.  Cardiovascular: Normal rate.  Pulmonary/Chest: Effort normal.  Musculoskeletal: Normal range of motion.  Neurological: He is alert and oriented to person, place, and time.  Skin: Skin is warm and dry.  Psychiatric: He has a normal mood and affect. His behavior is normal.  Nursing note and vitals reviewed.   Urgent Care Course     Procedures (including critical care time)  Labs Review Labs Reviewed - No data to display  Imaging Review No results found.   Visual Acuity Review  Right Eye Distance: 20/25 Left Eye Distance: 20/25 Bilateral Distance: 20/25 (corrected with glasses)   MDM   1. Corneal abrasion of left eye due to contact lens   2. Irritation of both eyes    Pt c/o bilateral eye pain that's burning after accidentally touching eyes after applying capsaicin cream to hands.  Eyes thoroughly rinses with saline in UC. Burning gradually subsided. Pain improved to 3/10 by time of discharge.  Rx: polytrim for corneal abrasion Left eye. Encouraged to keep rinsing eyes and eyelids to help with burning sensation.  May apply cool compresses. Cool compress provided to pt prior to discharge. F/u with eye specialist Monday if still having discomfort. If symptoms worsening this weekend, go to ED. Patient verbalized understanding and agreement with treatment plan.     Noland Fordyce, PA-C 07/24/16  1909

## 2016-07-26 ENCOUNTER — Telehealth: Payer: Self-pay | Admitting: Emergency Medicine

## 2016-07-26 NOTE — Telephone Encounter (Signed)
Patient states eyes are better.

## 2016-07-29 ENCOUNTER — Encounter (HOSPITAL_BASED_OUTPATIENT_CLINIC_OR_DEPARTMENT_OTHER): Payer: Self-pay

## 2016-07-29 ENCOUNTER — Emergency Department (HOSPITAL_BASED_OUTPATIENT_CLINIC_OR_DEPARTMENT_OTHER): Payer: Self-pay

## 2016-07-29 ENCOUNTER — Emergency Department (HOSPITAL_BASED_OUTPATIENT_CLINIC_OR_DEPARTMENT_OTHER)
Admission: EM | Admit: 2016-07-29 | Discharge: 2016-07-29 | Disposition: A | Discharge status: Home / Self Care | Payer: Self-pay | Attending: Emergency Medicine | Admitting: Emergency Medicine

## 2016-07-29 DIAGNOSIS — Z794 Long term (current) use of insulin: Secondary | ICD-10-CM | POA: Insufficient documentation

## 2016-07-29 DIAGNOSIS — F17210 Nicotine dependence, cigarettes, uncomplicated: Secondary | ICD-10-CM | POA: Insufficient documentation

## 2016-07-29 DIAGNOSIS — R202 Paresthesia of skin: Secondary | ICD-10-CM | POA: Insufficient documentation

## 2016-07-29 DIAGNOSIS — Z79899 Other long term (current) drug therapy: Secondary | ICD-10-CM | POA: Insufficient documentation

## 2016-07-29 DIAGNOSIS — I2510 Atherosclerotic heart disease of native coronary artery without angina pectoris: Secondary | ICD-10-CM | POA: Insufficient documentation

## 2016-07-29 DIAGNOSIS — I10 Essential (primary) hypertension: Secondary | ICD-10-CM | POA: Insufficient documentation

## 2016-07-29 DIAGNOSIS — E119 Type 2 diabetes mellitus without complications: Secondary | ICD-10-CM | POA: Insufficient documentation

## 2016-07-29 DIAGNOSIS — M545 Low back pain: Secondary | ICD-10-CM | POA: Insufficient documentation

## 2016-07-29 LAB — URINALYSIS, ROUTINE W REFLEX MICROSCOPIC
Bilirubin Urine: NEGATIVE
GLUCOSE, UA: NEGATIVE mg/dL
HGB URINE DIPSTICK: NEGATIVE
Ketones, ur: NEGATIVE mg/dL
Leukocytes, UA: NEGATIVE
Nitrite: NEGATIVE
Protein, ur: NEGATIVE mg/dL
SPECIFIC GRAVITY, URINE: 1.01 (ref 1.005–1.030)
pH: 6 (ref 5.0–8.0)

## 2016-07-29 NOTE — ED Triage Notes (Signed)
C/o lower back pain with numbness to both buttocks x 1 week-denies injury-NAD-steady gait

## 2016-07-29 NOTE — ED Provider Notes (Signed)
[facility] DEPT [facility] Provider Note   CSN: [id number] Arrival date & time: 07/29/16  1536  By signing my name below, I, Gwenlyn Fudge, attest that this documentation has been prepared under the direction and in the presence of Carlisle Cater, PA-C. Electronically Signed: Gwenlyn Fudge, ED Scribe. 07/29/16. 4:58 PM.   History   Chief Complaint Chief Complaint  Patient presents with  . Back Pain   The history is provided by the patient. No language interpreter was used.   HPI Comments: Marc Walker is a 46 y.o. male with PMHx of DM, MI and CAD, diabetic neuropathy who presents to the Emergency Department complaining of gradual onset, intermittent, waxing and waning, moderate,  numbness to bilateral buttocks for a few weeks. Pt states numbness is similar to neuropathy. Numbness does not extend down to the rectal area. He has associated sharp lower back pain that is exacerbated with deep inhalation. Pain has come on while seated and while standing. He has done some recent heavy lifting. Pt currently takes Hydrochlorothiazide and Lasix. He denies fever, unexpected weight loss, bowel or bladder incontinence   Past Medical History:  Diagnosis Date  . Coronary artery disease   . Diabetes mellitus without complication ([place])   . Heartburn   . High cholesterol   . Hyperlipidemia associated with type 2 diabetes mellitus ([place])   . Hypertension   . MI (myocardial infarction) 07/2009   [facility] in [place], [place] for last 2, 1st one in [place], [place]; total of 6 stents, last one 02/2014   . Morbid obesity ([place]) 07/06/2016  . OSA (obstructive sleep apnea) 07/06/2016   Moderate with AHI 21.8/hr by PSG 08/2012 now on CPAP  . Thyroid disease    past history    Patient Active Problem List   Diagnosis Date Noted  . Left flank pain 07/10/2016  . Leukocytosis 07/10/2016  . SIRS (systemic inflammatory response syndrome) ([place]) 07/10/2016  . Elevated troponin   . Pleurisy   . OSA (obstructive  sleep apnea) 07/06/2016  . Morbid obesity ([place]) 07/06/2016  . DM type 2 with diabetic peripheral neuropathy ([place]) 12/16/2015  . CAD S/P multiple PCI's 09/30/2015  . Tobacco abuse 09/30/2015  . Essential hypertension 09/16/2015  . Hyperlipidemia associated with type 2 diabetes mellitus ([place])   . Hyperlipidemia   . Chest pain 09/15/2015  . MI (myocardial infarction) 07/30/2009    Past Surgical History:  Procedure Laterality Date  . APPENDECTOMY    . CARDIAC CATHETERIZATION N/A 09/16/2015   Procedure: Left Heart Cath and Coronary Angiography;  Surgeon: Lorretta Harp, MD;  Location: [facility];  Service: Cardiovascular;  Laterality: N/A;  . CARDIAC CATHETERIZATION N/A 09/16/2015   Procedure: Coronary Stent Intervention;  Surgeon: Lorretta Harp, MD;  Location: [facility];  Service: Cardiovascular;  Laterality: N/A;  . CORONARY STENT PLACEMENT         Home Medications    Prior to Admission medications   Medication Sig Start Date End Date Taking? Authorizing Provider  amLODipine (NORVASC) 5 MG tablet Take 1 tablet (5 mg total) by mouth daily. 05/29/16 08/27/16  Fay Records, MD  aspirin 81 MG tablet Take 1 tablet (81 mg total) by mouth daily. 07/06/16   Maren Reamer, MD  blood glucose meter kit and supplies KIT Dispense based on patient and insurance preference. Use up to four times daily as directed. (FOR ICD-9 250.00, 250.01). 09/17/15   Ripudeep Krystal Eaton, MD  clopidogrel (PLAVIX) 75 MG  tablet Take 1 tablet (75 mg total) by mouth daily. 12/02/15   Fay Records, MD  ezetimibe (ZETIA) 10 MG tablet Take 1 tablet (10 mg total) by mouth daily. 06/18/16 09/16/16  Fay Records, MD  famotidine (PEPCID) 20 MG tablet Take 1 tablet (20 mg total) by mouth 2 (two) times daily. 07/21/16   Maren Reamer, MD  furosemide (LASIX) 40 MG tablet Take 1 tablet (40 mg total) by mouth 3 (three) times a week. 05/25/16 08/23/16  Fay Records, MD  gabapentin (NEURONTIN) 300 MG capsule Take 3  capsules (900 mg total) by mouth 3 (three) times daily. 07/06/16   Maren Reamer, MD  glucose blood (TRUE METRIX BLOOD GLUCOSE TEST) test strip Use as instructed 03/17/16   Maren Reamer, MD  hydrochlorothiazide (HYDRODIURIL) 25 MG tablet Take 1 tablet (25 mg total) by mouth daily. 07/06/16   Maren Reamer, MD  insulin aspart (NOVOLOG) 100 UNIT/ML injection Inject 20 Units into the skin 3 (three) times daily with meals. 07/06/16   Maren Reamer, MD  insulin glargine (LANTUS) 100 UNIT/ML injection Inject 0.38 mLs (38 Units total) into the skin 2 (two) times daily. 07/21/16   Maren Reamer, MD  Insulin Syringes, Disposable, U-100 0.5 ML MISC Take insulin as directed   Diagnosis: Insulin dependent diabetes mellitus ICD10 250 11/04/15   Maren Reamer, MD  lisinopril (PRINIVIL,ZESTRIL) 10 MG tablet Take 1 tablet (10 mg total) by mouth 2 (two) times daily. 09/30/15   Imogene Burn, PA-C  metFORMIN (GLUCOPHAGE-XR) 500 MG 24 hr tablet TAKE 2 TABLETS BY MOUTH 2 TIMES DAILY WITH A MEAL. 05/01/16   Maren Reamer, MD  metoprolol (LOPRESSOR) 50 MG tablet Take 1 tablet (50 mg total) by mouth 2 (two) times daily. 07/21/16 10/19/16  Maren Reamer, MD  nicotine (NICOTROL) 10 MG inhaler Inhale 1 cartridge (1 continuous puffing total) into the lungs as needed for smoking cessation. 09/30/15   Imogene Burn, PA-C  nicotine polacrilex (NICORETTE) 4 MG gum Take 1 each (4 mg total) by mouth as needed for smoking cessation. 07/21/16   Maren Reamer, MD  nitroGLYCERIN (NITROSTAT) 0.4 MG SL tablet Place 1 tablet (0.4 mg total) under the tongue every 5 (five) minutes as needed for chest pain. 09/17/15   Ripudeep Krystal Eaton, MD  oxybutynin (DITROPAN XL) 5 MG 24 hr tablet Take 1 tablet (5 mg total) by mouth at bedtime. 07/21/16   Maren Reamer, MD  potassium chloride (K-DUR) 10 MEQ tablet Take 1 tablet (10 mEq total) by mouth daily. 05/25/16 08/23/16  Fay Records, MD  rosuvastatin (CRESTOR) 40 MG tablet Take 1 tablet  (40 mg total) by mouth daily. 07/06/16 10/04/16  Maren Reamer, MD  trimethoprim-polymyxin b (POLYTRIM) ophthalmic solution Place 1 drop into the left eye every 4 (four) hours. For 5 days 07/24/16   Noland Fordyce, PA-C  TRUEPLUS LANCETS 28G MISC Use as directed 03/17/16   Maren Reamer, MD    Family History Family History  Problem Relation Age of Onset  . Adopted: Yes  . Diabetes Maternal Grandmother     Social History Social History  Substance Use Topics  . Smoking status: Current Every Day Smoker    Packs/day: 1.00    Years: 33.00    Types: Cigarettes  . Smokeless tobacco: Never Used  . Alcohol use No   Allergies   Other and Levofloxacin  Review of Systems Review of Systems  Constitutional:  Negative for fever and unexpected weight change.  Gastrointestinal: Negative for constipation.       Neg for fecal incontinence  Genitourinary: Negative for difficulty urinating, flank pain and hematuria.       No bladder or bowel incontinence.   Musculoskeletal: Positive for back pain.  Neurological: Positive for numbness. Negative for weakness.       Negative for saddle paresthesias    Physical Exam Updated Vital Signs BP 126/86 (BP Location: Left Arm)   Pulse 71   Temp 97.8 F (36.6 C) (Oral)   Resp 18   SpO2 98%   Physical Exam  Constitutional: He appears well-developed and well-nourished.  HENT:  Head: Normocephalic and atraumatic.  Eyes: Conjunctivae are normal.  Neck: Normal range of motion.  Abdominal: Soft. There is no tenderness. There is no CVA tenderness.  Musculoskeletal: Normal range of motion. He exhibits no tenderness.  No step-off noted with palpation of spine.   Neurological: He is alert. He has normal reflexes. No sensory deficit. He exhibits normal muscle tone.  5/5 strength in entire lower extremities bilaterally. No sensation deficit.   Skin: Skin is warm and dry.  Psychiatric: He has a normal mood and affect.  Nursing note and vitals  reviewed.    ED Treatments / Results  DIAGNOSTIC STUDIES: Oxygen Saturation is 98% on RA, normal by my interpretation.    COORDINATION OF CARE: 4:56 PM Discussed treatment plan with pt at bedside which includes DG Lumbar Spine and Urinalysis and pt agreed to plan.  Radiology Dg Lumbar Spine Complete  Result Date: 07/29/2016 CLINICAL DATA:  Lower back pain with buttock paresthesias, intermittently over the past several weeks with no known trauma. EXAM: LUMBAR SPINE - COMPLETE 4+ VIEW COMPARISON:  None. FINDINGS: The lumbar vertebrae are normal in height. Mild degenerative disc changes are present at L1-2, also at T11-12 and T12-L1. There is minimal retrolisthesis at L2-3 which is probably degenerative. Facet articulations appear intact. No spondylolysis. Sacroiliac joints are unremarkable. No bone lesion or bony destruction IMPRESSION: Mild degenerative disc changes. Mild degenerative appearing retrolisthesis at L2-3. Electronically Signed   By: Andreas Newport M.D.   On: 07/29/2016 18:06    Procedures Procedures (including critical care time)  Medications Ordered in ED Medications - No data to display   Initial Impression / Assessment and Plan / ED Course  I have reviewed the triage vital signs and the nursing notes.  Pertinent labs & imaging results that were available during my care of the patient were reviewed by me and considered in my medical decision making (see chart for details).      Vital signs reviewed and are as follows: Vitals:   07/29/16 1823 07/29/16 1826  BP: 97/60 124/78  Pulse: 72   Resp: 16   Temp:      Pt informed of x-ray results. No red flag s/s of low back pain. Patient was counseled on back pain precautions and told to do activity as tolerated but do not lift, push, or pull heavy objects more than 10 pounds for the next week.  Patient counseled to use ice or heat on back for no longer than 15 minutes every hour.   Patient urged to follow-up with PCP  if pain does not improve with treatment and rest or if pain becomes recurrent. Urged to return with worsening severe pain, loss of bowel or bladder control, trouble walking.   The patient verbalizes understanding and agrees with the plan.    I personally performed  the services described in this documentation, which was scribed in my presence. The recorded information has been reviewed and is accurate.   Final Clinical Impressions(s) / ED Diagnoses   Final diagnoses:  Paresthesia of buttock   Patient with back pain and intermittent buttocks paresthesias. Seems to be S1 or S2 distribution. Patient often has a wallet in his back pocket but states it is on the other side. Lumbar films do not show any concerning etiology. No other neurological deficits. Patient is ambulatory. No warning symptoms of back pain including: fecal incontinence, urinary retention or overflow incontinence, night sweats, waking from sleep with back pain, unexplained fevers or weight loss, h/o cancer, IVDU, recent trauma. No concern for cauda equina, epidural abscess, or other serious cause of back pain. Conservative measures such as rest, ice/heat and pain medicine indicated with PCP follow-up if no improvement with conservative management. Would avoid NSAIDs due to known coronary artery disease.   New Prescriptions New Prescriptions   No medications on file     Carlisle Cater, PA-C 07/29/16 [place], MD 07/29/16 2329

## 2016-07-29 NOTE — Discharge Instructions (Signed)
Please read and follow all provided instructions.  Your diagnoses today include:  1. Paresthesia of buttock     Tests performed today include:  Vital signs - see below for your results today  Medications prescribed:   None  Take any prescribed medications only as directed.  Home care instructions:   Follow any educational materials contained in this packet  Please rest, use ice or heat on your back for the next several days  Do not lift, push, pull anything more than 10 pounds for the next week  Follow-up instructions: Please follow-up with your primary care provider in the next 1 week for further evaluation of your symptoms.   Return instructions:  SEEK IMMEDIATE MEDICAL ATTENTION IF YOU HAVE:  New numbness, tingling, weakness, or problem with the use of your arms or legs  Severe back pain not relieved with medications  Loss control of your bowels or bladder  Increasing pain in any areas of the body (such as chest or abdominal pain)  Shortness of breath, dizziness, or fainting.   Worsening nausea (feeling sick to your stomach), vomiting, fever, or sweats  Any other emergent concerns regarding your health   Additional Information:  Your vital signs today were: BP 124/78 (BP Location: Right Arm)    Pulse 72    Temp 97.8 F (36.6 C) (Oral)    Resp 16    SpO2 96%  If your blood pressure (BP) was elevated above 135/85 this visit, please have this repeated by your doctor within one month. --------------

## 2016-07-29 NOTE — ED Notes (Signed)
Patient transported to X-ray 

## 2016-07-29 NOTE — ED Notes (Signed)
ED Provider at bedside. 

## 2016-07-29 NOTE — ED Notes (Signed)
No answer in WR x1.  Walked outside of building to look for pt but he was not out there.

## 2016-08-05 ENCOUNTER — Ambulatory Visit: Payer: Self-pay | Admitting: Pharmacist

## 2016-08-05 ENCOUNTER — Ambulatory Visit: Payer: Self-pay | Attending: Internal Medicine | Admitting: Pharmacist

## 2016-08-05 DIAGNOSIS — E1142 Type 2 diabetes mellitus with diabetic polyneuropathy: Secondary | ICD-10-CM

## 2016-08-05 DIAGNOSIS — E119 Type 2 diabetes mellitus without complications: Secondary | ICD-10-CM | POA: Insufficient documentation

## 2016-08-05 MED ORDER — INSULIN PEN NEEDLE 31G X 8 MM MISC: 5 refills | Status: DC

## 2016-08-05 MED ORDER — INSULIN ASPART 100 UNIT/ML FLEXPEN: 16.0000 [IU] | PEN_INJECTOR | Freq: Three times a day (TID) | SUBCUTANEOUS | 2 refills | Status: DC

## 2016-08-05 MED ORDER — INSULIN GLARGINE 100 UNIT/ML SOLOSTAR PEN: 42.0000 [IU] | PEN_INJECTOR | Freq: Two times a day (BID) | SUBCUTANEOUS | 3 refills | Status: DC

## 2016-08-05 MED ORDER — INSULIN ASPART 100 UNIT/ML ~~LOC~~ SOLN: 16.0000 [IU] | Freq: Three times a day (TID) | SUBCUTANEOUS | 3 refills | Status: DC

## 2016-08-05 MED ORDER — INSULIN GLARGINE 100 UNIT/ML ~~LOC~~ SOLN: 42.0000 [IU] | Freq: Two times a day (BID) | SUBCUTANEOUS | 3 refills | Status: DC

## 2016-08-05 NOTE — Patient Instructions (Addendum)
Thanks for coming to see Marc Walker!  Increase Lantus to 42 units twice a day  Start taking Novolog 16 units with smaller meals and 20 units with regular meals.  Follow up with Dr. Julien NordmannLangeland in 2 weeks   Carbohydrate Counting for Diabetes Mellitus, Adult Carbohydrate counting is a method for keeping track of how many carbohydrates you eat. Eating carbohydrates naturally increases the amount of sugar (glucose) in the blood. Counting how many carbohydrates you eat helps keep your blood glucose within normal limits, which helps you manage your diabetes (diabetes mellitus). It is important to know how many carbohydrates you can safely have in each meal. This is different for every person. A diet and nutrition specialist (registered dietitian) can help you make a meal plan and calculate how many carbohydrates you should have at each meal and snack. Carbohydrates are found in the following foods:  Grains, such as breads and cereals.  Dried beans and soy products.  Starchy vegetables, such as potatoes, peas, and corn.  Fruit and fruit juices.  Milk and yogurt.  Sweets and snack foods, such as cake, cookies, candy, chips, and soft drinks. How do I count carbohydrates? There are two ways to count carbohydrates in food. You can use either of the methods or a combination of both. Reading "Nutrition Facts" on packaged food  The "Nutrition Facts" list is included on the labels of almost all packaged foods and beverages in the [place] It includes:  The serving size.  Information about nutrients in each serving, including the grams (g) of carbohydrate per serving. To use the "Nutrition Facts":  Decide how many servings you will have.  Multiply the number of servings by the number of carbohydrates per serving.  The resulting number is the total amount of carbohydrates that you will be having. Learning standard serving sizes of other foods  When you eat foods containing carbohydrates that are not  packaged or do not include "Nutrition Facts" on the label, you need to measure the servings in order to count the amount of carbohydrates:  Measure the foods that you will eat with a food scale or measuring cup, if needed.  Decide how many standard-size servings you will eat.  Multiply the number of servings by 15. Most carbohydrate-rich foods have about 15 g of carbohydrates per serving.  For example, if you eat 8 oz (170 g) of strawberries, you will have eaten 2 servings and 30 g of carbohydrates (2 servings x 15 g = 30 g).  For foods that have more than one food mixed, such as soups and casseroles, you must count the carbohydrates in each food that is included. The following list contains standard serving sizes of common carbohydrate-rich foods. Each of these servings has about 15 g of carbohydrates:   hamburger bun or  English muffin.   oz (15 mL) syrup.   oz (14 g) jelly.  1 slice of bread.  1 six-inch tortilla.  3 oz (85 g) cooked rice or pasta.  4 oz (113 g) cooked dried beans.  4 oz (113 g) starchy vegetable, such as peas, corn, or potatoes.  4 oz (113 g) hot cereal.  4 oz (113 g) mashed potatoes or  of a large baked potato.  4 oz (113 g) canned or frozen fruit.  4 oz (120 mL) fruit juice.  4-6 crackers.  6 chicken nuggets.  6 oz (170 g) unsweetened dry cereal.  6 oz (170 g) plain fat-free yogurt or yogurt sweetened with artificial sweeteners.  8 oz (240 mL) milk.  8 oz (170 g) fresh fruit or one small piece of fruit.  24 oz (680 g) popped popcorn. Example of carbohydrate counting Sample meal  3 oz (85 g) chicken breast.  6 oz (170 g) brown rice.  4 oz (113 g) corn.  8 oz (240 mL) milk.  8 oz (170 g) strawberries with sugar-free whipped topping. Carbohydrate calculation 1. Identify the foods that contain carbohydrates:  Rice.  Corn.  Milk.  Strawberries. 2. Calculate how many servings you have of each food:  2 servings rice.  1  serving corn.  1 serving milk.  1 serving strawberries. 3. Multiply each number of servings by 15 g:  2 servings rice x 15 g = 30 g.  1 serving corn x 15 g = 15 g.  1 serving milk x 15 g = 15 g.  1 serving strawberries x 15 g = 15 g. 4. Add together all of the amounts to find the total grams of carbohydrates eaten:  30 g + 15 g + 15 g + 15 g = 75 g of carbohydrates total. This information is not intended to replace advice given to you by your health care provider. Make sure you discuss any questions you have with your health care provider. Document Released: 06/15/2005 Document Revised: 01/03/2016 Document Reviewed: 11/27/2015 Elsevier Interactive Patient Education  2017 [employer].

## 2016-08-05 NOTE — Progress Notes (Signed)
S:    Patient arrives in good spirits. Presents for diabetes management. Patient was referred on 07/21/16 by Dr. Julien NordmannLangeland.   Patient reports adherence with medications. Current diabetes medications include: Lantus 38 units twice daily, Novolog 20 units TID, and metformin XR 1000 mg BID. He will skip his Novolog if he skips a meal.   Patient reports one hypoglycemic event in the middle of the night with a reading of 63. This is his only true hypoglycemic event. Other lower readings were in the 80s. Otherwise, his readings are much higher.  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 He tries to put Splenda in his tea but if there isn't any Splenda, he will put a teaspoon of sugar.  He doesn't exercise due to pain from neuropathy.  He also reports erectile dysfunction and urinary incontinence.  O:  . Lab Results  Component Value Date   HGBA1C 7.8 (H) 07/10/2016     Home fasting CBG: 112-228 Night CBG: 120-130s  A/P: Diabetes currently uncontrolled based on A1C of 7.8 but this A1c is improved (last one 8.1) Reports hypoglycemic events and is able to verbalize appropriate hypoglycemia management plan. Reports adherence with medication. Control is suboptimal due to current variable diet/meal times and insulin adjustment.  Increased Lantus to 42 units twice daily and decreased Novolog to 16 units for a small meal and continue 20 units for regular meals.  Continue metformin extended release. Patient education on trying to keep a consistent diet to avoid such large variety in his blood glucose levels. Reviewed blood glucose goals with him. I am hoping more basal insulin will get his fastings down and the less bolus will keep him from getting low. Once he has no more hypoglycemia, will titrate to get fastings to goal more aggressively. He may benefit from a more varied sliding scale of Novolog in the future if he is able  to manage this one.   Of note, for ease of use, switching insulin vials to the pens. Patient educated on how to use pen and was able to demonstrate use.   Patient to follow up with Dr. Julien NordmannLangeland for urinary incontinence and erectile dysfunction.     Next A1C anticipated April 2018.  Written patient instructions provided.  Follow up in Pharmacist Clinic Visit after visit with Dr. Julien NordmannLangeland.   Total time in face to face counseling 20 minutes. Patient seen with Mable FillAmy Dorszynski, PharmD Candidate

## 2016-08-05 NOTE — Addendum Note (Signed)
Addended by: Santa LighterHAMMER, STACEY K on: 08/05/2016 05:24 PM   Modules accepted: Orders

## 2016-08-06 ENCOUNTER — Other Ambulatory Visit: Payer: Self-pay | Admitting: Pharmacist

## 2016-08-06 MED ORDER — INSULIN ASPART 100 UNIT/ML FLEXPEN: 16.0000 [IU] | PEN_INJECTOR | Freq: Three times a day (TID) | SUBCUTANEOUS | 2 refills | Status: DC

## 2016-08-06 MED ORDER — INSULIN GLARGINE 100 UNIT/ML SOLOSTAR PEN
42.0000 [IU] | PEN_INJECTOR | Freq: Two times a day (BID) | SUBCUTANEOUS | 3 refills | Status: DC
Start: 2016-08-06 — End: 2016-09-17

## 2016-08-06 MED ORDER — INSULIN PEN NEEDLE 31G X 8 MM MISC: 5 refills | Status: DC

## 2016-08-10 MED FILL — $LANTUS 100 UNITS/ML VIAL: 100 | 30 days supply | Qty: 30 | Fill #0

## 2016-08-10 MED FILL — NOVOLOG FLEXPEN SYRINGE: 100 | 30 days supply | Qty: 15 | Fill #0

## 2016-08-10 MED FILL — TRUEPLUS PEN NDL 31GX3/16: 31 GX3/16" | 30 days supply | Qty: 100 | Fill #0

## 2016-08-11 MED FILL — TRUEPLUS SYR 0.5ML 31GX5/16: 31G X 5/16" | 30 days supply | Qty: 100 | Fill #4

## 2016-08-14 ENCOUNTER — Other Ambulatory Visit: Payer: Self-pay | Admitting: *Deleted

## 2016-08-14 MED ORDER — LANTUS SOLOSTAR 100 UNIT/ML ~~LOC~~ SOPN: 42.0000 [IU] | PEN_INJECTOR | Freq: Two times a day (BID) | SUBCUTANEOUS | 3 refills | Status: DC

## 2016-08-14 NOTE — Telephone Encounter (Signed)
PRINTED FOR PASS PROGRAM 

## 2016-08-17 ENCOUNTER — Other Ambulatory Visit: Payer: Self-pay

## 2016-08-18 ENCOUNTER — Other Ambulatory Visit: Payer: Self-pay

## 2016-08-18 MED ORDER — INSULIN LISPRO 100 UNIT/ML (KWIKPEN): PEN_INJECTOR | SUBCUTANEOUS | 3 refills | Status: DC

## 2016-08-21 ENCOUNTER — Other Ambulatory Visit: Payer: Self-pay | Admitting: *Deleted

## 2016-08-21 DIAGNOSIS — E1169 Type 2 diabetes mellitus with other specified complication: Secondary | ICD-10-CM

## 2016-08-21 DIAGNOSIS — E785 Hyperlipidemia, unspecified: Principal | ICD-10-CM

## 2016-08-21 DIAGNOSIS — I10 Essential (primary) hypertension: Secondary | ICD-10-CM

## 2016-08-22 LAB — LIPID PANEL
CHOLESTEROL TOTAL: 228 mg/dL — AB (ref 100–199)
Chol/HDL Ratio: 6.9 ratio units — ABNORMAL HIGH (ref 0.0–5.0)
HDL: 33 mg/dL — AB (ref >39)
LDL Calculated: 159 mg/dL — ABNORMAL HIGH (ref 0–99)
TRIGLYCERIDES: 180 mg/dL — AB (ref 0–149)
VLDL Cholesterol Cal: 36 mg/dL (ref 5–40)

## 2016-08-22 LAB — HEPATIC FUNCTION PANEL
ALK PHOS: 76 IU/L (ref 39–117)
ALT: 28 IU/L (ref 0–44)
AST: 14 IU/L (ref 0–40)
Albumin: 3.7 g/dL (ref 3.5–5.5)
BILIRUBIN, DIRECT: 0.08 mg/dL (ref 0.00–0.40)
Bilirubin Total: 0.2 mg/dL (ref 0.0–1.2)
TOTAL PROTEIN: 6.4 g/dL (ref 6.0–8.5)

## 2016-08-24 ENCOUNTER — Other Ambulatory Visit: Payer: Self-pay

## 2016-08-24 ENCOUNTER — Encounter: Payer: Self-pay | Admitting: *Deleted

## 2016-08-24 ENCOUNTER — Telehealth: Payer: Self-pay | Admitting: Internal Medicine

## 2016-08-24 ENCOUNTER — Telehealth: Payer: Self-pay | Admitting: *Deleted

## 2016-08-24 DIAGNOSIS — I2510 Atherosclerotic heart disease of native coronary artery without angina pectoris: Secondary | ICD-10-CM

## 2016-08-24 DIAGNOSIS — Z9861 Coronary angioplasty status: Principal | ICD-10-CM

## 2016-08-24 DIAGNOSIS — E7800 Pure hypercholesterolemia, unspecified: Secondary | ICD-10-CM

## 2016-08-24 NOTE — Telephone Encounter (Signed)
Lmtcb to go over lab results and findings 

## 2016-08-24 NOTE — Telephone Encounter (Signed)
Called patient and offered this Friday 3/2 with Dr. Tenny Crawoss (11:15 am).  Pt states he has to work 7-3:30 that day.  I suggested he can see which date would work best for him/his work.  Advised he may need to be evaluated at urgent care after work hours if he continues to have symptoms.  Also can go to ER at any time for chest pain for evaluation.    Pt states he will talk to the boss tomorrow and see what will work best.

## 2016-08-24 NOTE — Telephone Encounter (Signed)
I s/w pt about lab results and findings. I asked pt if he was taking his Crestor 40 mg daily and Zetia 10 mg daily. Pt answered yes. I explained to the pt that his cholesterol #'s were much higher than last month when they looked good. I advised pt that I will d/w Bing NeighborsScott W. PA to further recommendations about cholesterol plan.   Pt then states he has been having bilateral shoulder pain that radiates from the front to the back of his shoulders as well as nausea. Pt states he somewhat feels like he did before he had his heart attacks. 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 I advised pt that I will have a Triage nurse call him to evaluate further.

## 2016-08-24 NOTE — Telephone Encounter (Signed)
Follow up     Please call pt he is returning Carol's call about his lab results

## 2016-08-24 NOTE — Telephone Encounter (Signed)
Needs to be seen sooner either with me or with Wende MottS Weaver who has seen him

## 2016-08-24 NOTE — Telephone Encounter (Signed)
Walk In pt Form- Disability Parking Pla-Card-dropped off placed in [employer] Doc Box.

## 2016-08-24 NOTE — Telephone Encounter (Signed)
This encounter was created in error - please disregard.

## 2016-08-24 NOTE — Telephone Encounter (Signed)
20 min phone call. I spoke with the pt and he complains of soreness in his shoulders similar to what it feels like when you work out.  This occurs when the pt cooks dinner, takes a walk or goes upstairs. Yesterday the pt had soreness in shoulders and developed nausea. He said the discomfort begins in front of chest and spreads into his shoulders and back and these symptoms are similar to what he felt with his heart attack. The pt also notes CP with exertion. Symptoms have been occurring for a couple of weeks.  I attempted to schedule the pt for an office visit but he just started a new job and is in orientation and they are strict about missing work. I put him on first available with Dr Tenny Crawoss 09/04/16 and he has a scheduled appt on 09/21/16 with Dr Tenny Crawoss.  The pt will speak with his manager tomorrow and see if he can work out missing time to attend an appointment.  The pt will contact the office tomorrow to further discuss making an earlier office visit. I advised the pt to proceed to ER is symptoms worsen or increase in frequency.  Pt agreed with plan.

## 2016-08-25 ENCOUNTER — Ambulatory Visit: Payer: Self-pay | Admitting: Internal Medicine

## 2016-08-25 NOTE — Telephone Encounter (Signed)
Follow Up ° ° °Pt returning phone call. Requesting call back. °

## 2016-08-25 NOTE — Telephone Encounter (Signed)
Lmtcb to go over new recommendations for pt in regards to his lab work. PA would like to repeat Lipid panel and refer pt to the [facility] Clinic.

## 2016-08-25 NOTE — Telephone Encounter (Signed)
S/w pt and went over new recommendations per Bing NeighborsScott W. PA to repeat Lipid panel and refer to Lipid Clinic. Pt does not want to get lab work until 3/20 because he starts a new shift and will go in at 12:30, since he has to be at work right now for 7 am per pt. Lipid panel 09/15/16. Referral placed for [facility]id Clinic. [facility], maybe we can get this done before he sees Dr. Tenny Crawoss.   Pt has appt 3/9 made by Triage nurse yesterday when I had placed a call to triage due to my call yesterday with the pt, see phone note 08/24/16. Pt states he cannot keep 09/04/16 appt and wants to cancel this appt. Advised pt per recommendations from Triage nurse and Dr. Tenny Crawoss he should keep the appt on 3/9, though pt still said to cancel because he has to work. Pt states he will keep appt 3/26 with Dr. Tenny Crawoss and fasting lab appt on 3/20.

## 2016-09-01 ENCOUNTER — Ambulatory Visit: Payer: Self-pay | Attending: Internal Medicine | Admitting: Internal Medicine

## 2016-09-01 ENCOUNTER — Encounter: Payer: Self-pay | Admitting: Internal Medicine

## 2016-09-01 VITALS — BP 161/106 | HR 90 | Temp 97.6°F | Resp 16 | Wt 288.4 lb

## 2016-09-01 DIAGNOSIS — E1142 Type 2 diabetes mellitus with diabetic polyneuropathy: Secondary | ICD-10-CM

## 2016-09-01 DIAGNOSIS — E119 Type 2 diabetes mellitus without complications: Secondary | ICD-10-CM | POA: Insufficient documentation

## 2016-09-01 DIAGNOSIS — Z79899 Other long term (current) drug therapy: Secondary | ICD-10-CM | POA: Insufficient documentation

## 2016-09-01 DIAGNOSIS — I25119 Atherosclerotic heart disease of native coronary artery with unspecified angina pectoris: Secondary | ICD-10-CM | POA: Insufficient documentation

## 2016-09-01 DIAGNOSIS — Z7982 Long term (current) use of aspirin: Secondary | ICD-10-CM | POA: Insufficient documentation

## 2016-09-01 DIAGNOSIS — I10 Essential (primary) hypertension: Secondary | ICD-10-CM | POA: Insufficient documentation

## 2016-09-01 DIAGNOSIS — Z794 Long term (current) use of insulin: Secondary | ICD-10-CM | POA: Insufficient documentation

## 2016-09-01 DIAGNOSIS — Z9119 Patient's noncompliance with other medical treatment and regimen: Secondary | ICD-10-CM

## 2016-09-01 DIAGNOSIS — G4733 Obstructive sleep apnea (adult) (pediatric): Secondary | ICD-10-CM | POA: Insufficient documentation

## 2016-09-01 DIAGNOSIS — Z5189 Encounter for other specified aftercare: Secondary | ICD-10-CM | POA: Insufficient documentation

## 2016-09-01 DIAGNOSIS — Z9114 Patient's other noncompliance with medication regimen: Secondary | ICD-10-CM | POA: Insufficient documentation

## 2016-09-01 DIAGNOSIS — Z91199 Patient's noncompliance with other medical treatment and regimen due to unspecified reason: Secondary | ICD-10-CM

## 2016-09-01 DIAGNOSIS — I252 Old myocardial infarction: Secondary | ICD-10-CM | POA: Insufficient documentation

## 2016-09-01 DIAGNOSIS — E785 Hyperlipidemia, unspecified: Secondary | ICD-10-CM | POA: Insufficient documentation

## 2016-09-01 LAB — GLUCOSE, POCT (MANUAL RESULT ENTRY): POC GLUCOSE: 110 mg/dL — AB (ref 70–99)

## 2016-09-01 MED ORDER — CLOPIDOGREL BISULFATE 75 MG PO TABS: 75.0000 mg | ORAL_TABLET | Freq: Every day | ORAL | 10 refills | Status: DC

## 2016-09-01 MED ORDER — TRUEPLUS LANCETS 28G MISC: 12 refills | Status: DC

## 2016-09-01 MED ORDER — ROSUVASTATIN CALCIUM 40 MG PO TABS: 40.0000 mg | ORAL_TABLET | Freq: Every day | ORAL | 3 refills | Status: DC

## 2016-09-01 MED ORDER — GLUCOSE BLOOD VI STRP: ORAL_STRIP | 12 refills | Status: DC

## 2016-09-01 MED ORDER — EZETIMIBE 10 MG PO TABS: 10.0000 mg | ORAL_TABLET | Freq: Every day | ORAL | 2 refills | Status: DC

## 2016-09-01 MED ORDER — AMLODIPINE BESYLATE 5 MG PO TABS: 5.0000 mg | ORAL_TABLET | Freq: Every day | ORAL | 6 refills | Status: DC

## 2016-09-01 MED ORDER — METOPROLOL TARTRATE 50 MG PO TABS: 50.0000 mg | ORAL_TABLET | Freq: Two times a day (BID) | ORAL | 11 refills | Status: DC

## 2016-09-01 MED ORDER — METFORMIN HCL ER 500 MG PO TB24: ORAL_TABLET | ORAL | 2 refills | Status: DC

## 2016-09-01 MED ORDER — FAMOTIDINE 20 MG PO TABS: 20.0000 mg | ORAL_TABLET | Freq: Two times a day (BID) | ORAL | 3 refills | Status: DC

## 2016-09-01 MED ORDER — HYDROCHLOROTHIAZIDE 25 MG PO TABS: 25.0000 mg | ORAL_TABLET | Freq: Every day | ORAL | 3 refills | Status: DC

## 2016-09-01 MED ORDER — LISINOPRIL 10 MG PO TABS: 10.0000 mg | ORAL_TABLET | Freq: Two times a day (BID) | ORAL | 3 refills | Status: DC

## 2016-09-01 MED FILL — AMLODIPINE BESYLATE 5 MG TA: 5 | 30 days supply | Qty: 30 | Fill #2

## 2016-09-01 MED FILL — TRUE METRIX TEST STRIP: 30 days supply | Qty: 100 | Fill #4

## 2016-09-01 MED FILL — OXYBUTYNIN CL ER 5 MG TAB: 5 | 30 days supply | Qty: 30 | Fill #1

## 2016-09-01 NOTE — Patient Instructions (Signed)
Stacey dm and bp check 2 wks    Low-Sodium Eating Plan Sodium, which is an element that makes up salt, helps you maintain a healthy balance of fluids in your body. Too much sodium can increase your blood pressure and cause fluid and waste to be held in your body. Your health care provider or dietitian may recommend following this plan if you have high blood pressure (hypertension), kidney disease, liver disease, or heart failure. Eating less sodium can help lower your blood pressure, reduce swelling, and protect your heart, liver, and kidneys. What are tips for following this plan? General guidelines   Most people on this plan should limit their sodium intake to 1,500-2,000 mg (milligrams) of sodium each day. Reading food labels   The Nutrition Facts label lists the amount of sodium in one serving of the food. If you eat more than one serving, you must multiply the listed amount of sodium by the number of servings.  Choose foods with less than 140 mg of sodium per serving.  Avoid foods with 300 mg of sodium or more per serving. Shopping   Look for lower-sodium products, often labeled as "low-sodium" or "no salt added."  Always check the sodium content even if foods are labeled as "unsalted" or "no salt added".  Buy fresh foods.  Avoid canned foods and premade or frozen meals.  Avoid canned, cured, or processed meats  Buy breads that have less than 80 mg of sodium per slice. Cooking   Eat more home-cooked food and less restaurant, buffet, and fast food.  Avoid adding salt when cooking. Use salt-free seasonings or herbs instead of table salt or sea salt. Check with your health care provider or pharmacist before using salt substitutes.  Cook with plant-based oils, such as canola, sunflower, or olive oil. Meal planning   When eating at a restaurant, ask that your food be prepared with less salt or no salt, if possible.  Avoid foods that contain MSG (monosodium glutamate). MSG is  sometimes added to [place] food, bouillon, and some canned foods. What foods are recommended? The items listed may not be a complete list. Talk with your dietitian about what dietary choices are best for you. Grains  Low-sodium cereals, including oats, puffed wheat and rice, and shredded wheat. Low-sodium crackers. Unsalted rice. Unsalted pasta. Low-sodium bread. Whole-grain breads and whole-grain pasta. Vegetables  Fresh or frozen vegetables. "No salt added" canned vegetables. "No salt added" tomato sauce and paste. Low-sodium or reduced-sodium tomato and vegetable juice. Fruits  Fresh, frozen, or canned fruit. Fruit juice. Meats and other protein foods  Fresh or frozen (no salt added) meat, poultry, seafood, and fish. Low-sodium canned tuna and salmon. Unsalted nuts. Dried peas, beans, and lentils without added salt. Unsalted canned beans. Eggs. Unsalted nut butters. Dairy  Milk. Soy milk. Cheese that is naturally low in sodium, such as ricotta cheese, fresh mozzarella, or Swiss cheese Low-sodium or reduced-sodium cheese. Cream cheese. Yogurt. Fats and oils  Unsalted butter. Unsalted margarine with no trans fat. Vegetable oils such as canola or olive oils. Seasonings and other foods  Fresh and dried herbs and spices. Salt-free seasonings. Low-sodium mustard and ketchup. Sodium-free salad dressing. Sodium-free light mayonnaise. Fresh or refrigerated horseradish. Lemon juice. Vinegar. Homemade, reduced-sodium, or low-sodium soups. Unsalted popcorn and pretzels. Low-salt or salt-free chips. What foods are not recommended? The items listed may not be a complete list. Talk with your dietitian about what dietary choices are best for you. Grains  Instant hot cereals. Bread  stuffing, pancake, and biscuit mixes. Croutons. Seasoned rice or pasta mixes. Noodle soup cups. Boxed or frozen macaroni and cheese. Regular salted crackers. Self-rising flour. Vegetables  Sauerkraut, pickled vegetables, and  relishes. Olives. [place] fries. Onion rings. Regular canned vegetables (not low-sodium or reduced-sodium). Regular canned tomato sauce and paste (not low-sodium or reduced-sodium). Regular tomato and vegetable juice (not low-sodium or reduced-sodium). Frozen vegetables in sauces. Meats and other protein foods  Meat or fish that is salted, canned, smoked, spiced, or pickled. Bacon, ham, sausage, hotdogs, corned beef, chipped beef, packaged lunch meats, salt pork, jerky, pickled herring, anchovies, regular canned tuna, sardines, salted nuts. Dairy  Processed cheese and cheese spreads. Cheese curds. Blue cheese. Feta cheese. String cheese. Regular cottage cheese. Buttermilk. Canned milk. Fats and oils  Salted butter. Regular margarine. Ghee. Bacon fat. Seasonings and other foods  Onion salt, garlic salt, seasoned salt, table salt, and sea salt. Canned and packaged gravies. Worcestershire sauce. Tartar sauce. Barbecue sauce. Teriyaki sauce. Soy sauce, including reduced-sodium. Steak sauce. Fish sauce. Oyster sauce. Cocktail sauce. Horseradish that you find on the shelf. Regular ketchup and mustard. Meat flavorings and tenderizers. Bouillon cubes. Hot sauce and Tabasco sauce. Premade or packaged marinades. Premade or packaged taco seasonings. Relishes. Regular salad dressings. Salsa. Potato and tortilla chips. Corn chips and puffs. Salted popcorn and pretzels. Canned or dried soups. Pizza. Frozen entrees and pot pies. Summary  Eating less sodium can help lower your blood pressure, reduce swelling, and protect your heart, liver, and kidneys.  Most people on this plan should limit their sodium intake to 1,500-2,000 mg (milligrams) of sodium each day.  Canned, boxed, and frozen foods are high in sodium. Restaurant foods, fast foods, and pizza are also very high in sodium. You also get sodium by adding salt to food.  Try to cook at home, eat more fresh fruits and vegetables, and eat less fast food, canned,  processed, or prepared foods. This information is not intended to replace advice given to you by your health care provider. Make sure you discuss any questions you have with your health care provider. Document Released: 12/05/2001 Document Revised: 06/08/2016 Document Reviewed: 06/08/2016 Elsevier Interactive Patient Education  2017 [employer].

## 2016-09-01 NOTE — Progress Notes (Signed)
Marc Walker, is a 46 y.o. male  DYJ:[id number]  [id number]  DOB - 1971-02-17  Chief Complaint  Patient presents with  . Follow-up        Subjective:   Marc Walker is a 46 y.o. male here today for a follow up visit for htn, cad and dm.  Last seen in Pharm clinic w/ Erline Levine 08/05/16 and insulin was adjusted at the time.  His  Recent predinner cbg 100 and 150 last 2 readings he obtained at his gf's home.   Of note, pt is very low on funds and unable to afford cbg strips and his meds currently. He is still taking his insulin, but running out as well. Last time he took any of his bp meds was this past Sat.    Patient has No headache, No chest pain, No abdominal pain - No Nausea, No new weakness tingling or numbness, No Cough - SOB.  No problems updated.  ALLERGIES: Allergies  Allergen Reactions  . 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  Pt only wants pain medicine if absolutely necessary.  . Levofloxacin Rash    Rash on back (not sun exposed)and hypersensitivity to skin on exposed skin/arm    PAST MEDICAL HISTORY: Past Medical History:  Diagnosis Date  . Coronary artery disease   . Diabetes mellitus without complication ([place])   . Heartburn   . High cholesterol   . Hyperlipidemia associated with type 2 diabetes mellitus ([place])   . Hypertension   . MI (myocardial infarction) 07/2009   [facility] in [place], [place] for last 2, 1st one in [place], [place]; total of 6 stents, last one 02/2014   . Morbid obesity ([place]) 07/06/2016  . OSA (obstructive sleep apnea) 07/06/2016   Moderate with AHI 21.8/hr by [facility] 08/2012 now on CPAP  . Thyroid disease    past history    MEDICATIONS AT HOME: Prior to Admission medications   Medication Sig Start Date End Date Taking? Authorizing Provider  amLODipine (NORVASC) 5 MG tablet Take 1 tablet (5 mg total) by mouth daily. 09/01/16 11/30/16  Maren Reamer, MD  aspirin 81 MG tablet Take 1 tablet (81 mg total) by  mouth daily. 07/06/16   Maren Reamer, MD  blood glucose meter kit and supplies KIT Dispense based on patient and insurance preference. Use up to four times daily as directed. (FOR ICD-9 250.00, 250.01). 09/17/15   Ripudeep Krystal Eaton, MD  clopidogrel (PLAVIX) 75 MG tablet Take 1 tablet (75 mg total) by mouth daily. 09/01/16   Maren Reamer, MD  ezetimibe (ZETIA) 10 MG tablet Take 1 tablet (10 mg total) by mouth daily. 09/01/16 11/30/16  Maren Reamer, MD  famotidine (PEPCID) 20 MG tablet Take 1 tablet (20 mg total) by mouth 2 (two) times daily. 09/01/16   Maren Reamer, MD  furosemide (LASIX) 40 MG tablet Take 1 tablet (40 mg total) by mouth 3 (three) times a week. 05/25/16 08/23/16  Fay Records, MD  gabapentin (NEURONTIN) 300 MG capsule Take 3 capsules (900 mg total) by mouth 3 (three) times daily. 07/06/16   Maren Reamer, MD  glucose blood (TRUE METRIX BLOOD GLUCOSE TEST) test strip Use as instructed 09/01/16   Maren Reamer, MD  hydrochlorothiazide (HYDRODIURIL) 25 MG tablet Take 1 tablet (25 mg total) by mouth daily. 09/01/16   Maren Reamer, MD  insulin aspart (NOVOLOG FLEXPEN) 100 UNIT/ML FlexPen Inject  16-20 Units into the skin 3 (three) times daily with meals. 16 units for smaller meals, 20 units for normal meals. 08/06/16   Maren Reamer, MD  Insulin Glargine (LANTUS SOLOSTAR) 100 UNIT/ML Solostar Pen Inject 42 Units into the skin 2 (two) times daily. 08/06/16   Maren Reamer, MD  insulin lispro (HUMALOG KWIKPEN) 100 UNIT/ML KiwkPen Inject 16-20 units into the skin 3 (three) times daily with meals. 16 units for smaller meals, 20 units for normal meals 08/18/16   Maren Reamer, MD  Insulin Pen Needle 31G X 8 MM MISC Use as directed 08/06/16   Maren Reamer, MD  Insulin Syringes, Disposable, U-100 0.5 ML MISC Take insulin as directed   Diagnosis: Insulin dependent diabetes mellitus ICD10 250 11/04/15   Maren Reamer, MD  LANTUS SOLOSTAR 100 UNIT/ML Solostar Pen Inject 42 Units into the  skin 2 (two) times daily. 08/14/16   Tresa Garter, MD  lisinopril (PRINIVIL,ZESTRIL) 10 MG tablet Take 1 tablet (10 mg total) by mouth 2 (two) times daily. 09/01/16   Maren Reamer, MD  metFORMIN (GLUCOPHAGE-XR) 500 MG 24 hr tablet TAKE 2 TABLETS BY MOUTH 2 TIMES DAILY WITH A MEAL. 09/01/16   Maren Reamer, MD  metoprolol (LOPRESSOR) 50 MG tablet Take 1 tablet (50 mg total) by mouth 2 (two) times daily. 09/01/16 11/30/16  Maren Reamer, MD  nicotine (NICOTROL) 10 MG inhaler Inhale 1 cartridge (1 continuous puffing total) into the lungs as needed for smoking cessation. 09/30/15   Imogene Burn, PA-C  nicotine polacrilex (NICORETTE) 4 MG gum Take 1 each (4 mg total) by mouth as needed for smoking cessation. 07/21/16   Maren Reamer, MD  nitroGLYCERIN (NITROSTAT) 0.4 MG SL tablet Place 1 tablet (0.4 mg total) under the tongue every 5 (five) minutes as needed for chest pain. 09/17/15   Ripudeep Krystal Eaton, MD  oxybutynin (DITROPAN XL) 5 MG 24 hr tablet Take 1 tablet (5 mg total) by mouth at bedtime. 07/21/16   Maren Reamer, MD  potassium chloride (K-DUR) 10 MEQ tablet Take 1 tablet (10 mEq total) by mouth daily. 05/25/16 08/23/16  Fay Records, MD  rosuvastatin (CRESTOR) 40 MG tablet Take 1 tablet (40 mg total) by mouth daily. 09/01/16 11/30/16  Maren Reamer, MD  trimethoprim-polymyxin b (POLYTRIM) ophthalmic solution Place 1 drop into the left eye every 4 (four) hours. For 5 days 07/24/16   Noland Fordyce, PA-C  TRUEPLUS LANCETS 28G MISC Use as directed 09/01/16   Maren Reamer, MD     Objective:   Vitals:   09/01/16 1621  BP: (!) 161/106  Pulse: 90  Resp: 16  Temp: 97.6 F (36.4 C)  TempSrc: Oral  SpO2: 98%  Weight: 288 lb 6.4 oz (130.8 kg)   Weight 07/21/16 282 lbs  Exam General appearance : Awake, alert, not in any distress. Speech Clear. Not toxic looking, morbid obese, pleasant HEENT: Atraumatic and Normocephalic, pupils equally reactive to light. Neck: supple, no JVD.    Chest:Good air entry bilaterally, no added sounds. CVS: S1 S2 regular, no murmurs/gallups or rubs. Abdomen: Bowel sounds active, Non tender and not distended with no gaurding, rigidity or rebound. Extremities: B/L Lower Ext shows no edema, both legs are warm to touch Neurology: Awake alert, and oriented X 3, CN II-XII grossly intact, Non focal Skin:No Rash  Data Review Lab Results  Component Value Date   HGBA1C 7.8 (H) 07/10/2016   HGBA1C 7.7 07/06/2016  HGBA1C 8.1 03/23/2016    Depression screen [facility] 2/9 09/01/2016 07/21/2016 07/06/2016 03/23/2016 03/23/2016  Decreased Interest 0 0 0 0 0  Down, Depressed, Hopeless 0 0 0 0 0  PHQ - 2 Score 0 0 0 0 0  Altered sleeping - - - - -  Tired, decreased energy - - - - -  Change in appetite - - - - -  Feeling bad or failure about yourself  - - - - -  Trouble concentrating - - - - -  Moving slowly or fidgety/restless - - - - -  Suicidal thoughts - - - - -  PHQ-9 Score - - - - -  Difficult doing work/chores - - - - -      Assessment & Plan   1. DM type 2 with diabetic peripheral neuropathy ([place]) Recd picks up his glucose strips and checks more often. Pt denies hypoglycemic events, will keep same regimen for now - POCT glucose (manual entry) 110 - fu [facility] for dm /bp check 2 wks  2. Essential hypertension, uncontrolled - ran out of all his meds since Sat. Urged picking up his meds asap and start taking again. - renewed all current meds to our pharmacy to see if can get samples/low cost/etc. - lisinopril (PRINIVIL,ZESTRIL) 10 MG tablet; Take 1 tablet (10 mg total) by mouth 2 (two) times daily.  Dispense: 60 tablet; Refill: 3 - renewed norvasc 5 qd, hctz 25 qd, metoprolol 50bid - held off on lasix for now. - fu in pharm clinic w/ Stacey in 2 wks for bp check as well - low salt diet discussed.  3. Coronary artery disease involving native coronary artery of native heart with angina pectoris ([place]) - renewed plavix 75 qd -  rosuvastatin (CRESTOR) 40 MG tablet; Take 1 tablet (40 mg total) by mouth daily.  Dispense: 90 tablet; Refill: 3 - continue asa 81 qd - total smoking cessation again advised.  4. Hyperlipidemia, unspecified hyperlipidemia type - rosuvastatin (CRESTOR) 40 MG tablet; Take 1 tablet (40 mg total) by mouth daily.  Dispense: 90 tablet; Refill: 3 - renewed zetia 3m qd  5. Medication noncompliance  Patient have been counseled extensively about nutrition and exercise  Return in about 4 weeks (around 09/29/2016), or if symptoms worsen or fail to improve.  The patient was given clear instructions to go to ER or return to medical center if symptoms don't improve, worsen or new problems develop. The patient verbalized understanding. The patient was told to call to get lab results if they haven't heard anything in the next week.   This note has been created with DSurveyor, quantity Any transcriptional errors are unintentional.   DMaren Reamer MD, [facility] [place]  09/01/2016, 4:46 PM

## 2016-09-02 ENCOUNTER — Telehealth: Payer: Self-pay | Admitting: Internal Medicine

## 2016-09-02 MED FILL — FAMOTIDINE 20 MG TABLET: 20 | 30 days supply | Qty: 60 | Fill #0

## 2016-09-02 MED FILL — ?LISINOPRIL 10 MG TABLET: 10 | 30 days supply | Qty: 60 | Fill #0

## 2016-09-02 MED FILL — METOPROLOL TARTRATE 50 MG T: 50 | 30 days supply | Qty: 60 | Fill #0

## 2016-09-02 MED FILL — TRUE METRIX TEST STRIP: 25 days supply | Qty: 100 | Fill #0

## 2016-09-02 MED FILL — EZETIMIBE 10 MG TABLET: 10 | 30 days supply | Qty: 30 | Fill #0

## 2016-09-02 MED FILL — ROSUVASTATIN CALCIUM 40 MG: 40 | 30 days supply | Qty: 30 | Fill #0

## 2016-09-02 MED FILL — CLOPIDOGREL 75 MG TABLET: 75 | 30 days supply | Qty: 30 | Fill #0

## 2016-09-02 MED FILL — HYDROCHLOROTHIAZIDE 25 MG T: 25 | 30 days supply | Qty: 30 | Fill #0

## 2016-09-02 MED FILL — METFORMIN HCL ER 500 MG TAB: 500 | 25 days supply | Qty: 120 | Fill #0

## 2016-09-02 MED FILL — TRUEplus LANCETS 28G MISC: 25 days supply | Qty: 100 | Fill #0

## 2016-09-02 NOTE — Telephone Encounter (Signed)
LVM for patient his Disability Parking Pla-Card is ready for pick up .

## 2016-09-03 ENCOUNTER — Telehealth: Payer: Self-pay | Admitting: Internal Medicine

## 2016-09-03 NOTE — Telephone Encounter (Signed)
Marc Walker is returning your call from 09/02/16.  Thanks

## 2016-09-04 ENCOUNTER — Ambulatory Visit: Payer: Self-pay | Admitting: Internal Medicine

## 2016-09-07 NOTE — Telephone Encounter (Signed)
LVM for patient to call me back. Provided him with MR Phone number.

## 2016-09-10 ENCOUNTER — Encounter: Payer: Self-pay | Admitting: Internal Medicine

## 2016-09-15 ENCOUNTER — Other Ambulatory Visit: Payer: Self-pay

## 2016-09-16 MED FILL — $LANTUS 100 UNITS/ML VIAL: 100 | 11 days supply | Qty: 10 | Fill #1

## 2016-09-17 ENCOUNTER — Ambulatory Visit: Payer: Self-pay | Attending: Internal Medicine | Admitting: Pharmacist

## 2016-09-17 ENCOUNTER — Encounter: Payer: Self-pay | Admitting: Pharmacist

## 2016-09-17 VITALS — BP 136/87 | HR 77

## 2016-09-17 DIAGNOSIS — Z794 Long term (current) use of insulin: Secondary | ICD-10-CM | POA: Insufficient documentation

## 2016-09-17 DIAGNOSIS — Z79899 Other long term (current) drug therapy: Secondary | ICD-10-CM

## 2016-09-17 DIAGNOSIS — I10 Essential (primary) hypertension: Secondary | ICD-10-CM | POA: Insufficient documentation

## 2016-09-17 DIAGNOSIS — E119 Type 2 diabetes mellitus without complications: Secondary | ICD-10-CM | POA: Insufficient documentation

## 2016-09-17 MED ORDER — POTASSIUM CHLORIDE ER 10 MEQ PO TBCR: 10.0000 meq | EXTENDED_RELEASE_TABLET | Freq: Every day | ORAL | 0 refills | Status: DC

## 2016-09-17 MED ORDER — INSULIN GLARGINE 100 UNIT/ML SOLOSTAR PEN: 45.0000 [IU] | PEN_INJECTOR | Freq: Two times a day (BID) | SUBCUTANEOUS | 3 refills | Status: DC

## 2016-09-17 MED FILL — !NOVOLOG FLEXPEN SYRINGE 1: 100/ML | 12 days supply | Qty: 6 | Fill #1

## 2016-09-17 NOTE — Progress Notes (Signed)
S:    Patient arrives in good spirits. Presents to the clinic for hypertension and diabetes evaluation, education, and management at the request of Dr. Illene LabradorLangland. Patient was referred on 07/21/16. Patient was last seen by Primary Care Provider on 09/01/16.   Patient reports adherence with hypertension and diabetes medications.  Current BP Medications include:  amlodipine 5 mg daily, hydrochlorothiazide 25mg  daily, metoprolol 50 mg BID, lisinopril 10mg  BID, furosemide 40mg  TIW   Current diabetes medications include: insulin aspart (Novolog) 16-20 Units TID, insulin glargine (Lantus) 42 Units BID, metformin XR 500 mg 2 tablets BID  O:  BP Readings from Last 3 Encounters: 09/17/16: 136/87 (pulse: 77bpm) 09/01/16: 161/106 07/21/16: 137/86 07/06/16: 134/107  Patient reports no hypoglycemic symptoms but has had readings in the 60's-70's. Otherwise readings were much higher. Pt was counseled on how to resolve hypoglycemic events with glucose tablets. Patient understood and has glucose tablets available.  Home fasting CBGs: 107-227 2-hour postprandial/random CBGs: 120-260  Patient reports nocturia - 4 urinations/night Patient reports neuropathy - hands uncontrolled pain with gabapentin Patient denies visual changes.  A/P: HTN is controlled on current medications. No changes made at this time. Diabetes is uncontrolled on current medications. Increased Lantus to 45 Units BID and changed to Lantus Solostar due to patient preference for pen. Pt will get labs at follow up visit next week with Dr. Illene LabradorLangland. Encouraged patient to take all of his medications as prescribed and to not miss any doses. Patient verbalized understanding.  Results reviewed and written information provided. Total time in face-to-face counseling 30 minutes.   F/U Clinic Visit with Dr. Illene LabradorLangland next week.  Patient seen with Cheral Bayasey Liam Cammarata, PharmD Candidate

## 2016-09-17 NOTE — Patient Instructions (Signed)
Thanks for coming to see [place]!  Increase Lantus to 45 units twice daily.  I have refilled the potassium to last you until you see Dr. Julien Nordmann.   Carbohydrate Counting for Diabetes Mellitus, Adult Carbohydrate counting is a method for keeping track of how many carbohydrates you eat. Eating carbohydrates naturally increases the amount of sugar (glucose) in the blood. Counting how many carbohydrates you eat helps keep your blood glucose within normal limits, which helps you manage your diabetes (diabetes mellitus). It is important to know how many carbohydrates you can safely have in each meal. This is different for every person. A diet and nutrition specialist (registered dietitian) can help you make a meal plan and calculate how many carbohydrates you should have at each meal and snack. Carbohydrates are found in the following foods:  Grains, such as breads and cereals.  Dried beans and soy products.  Starchy vegetables, such as potatoes, peas, and corn.  Fruit and fruit juices.  Milk and yogurt.  Sweets and snack foods, such as cake, cookies, candy, chips, and soft drinks. How do I count carbohydrates? There are two ways to count carbohydrates in food. You can use either of the methods or a combination of both. Reading "Nutrition Facts" on packaged food  The "Nutrition Facts" list is included on the labels of almost all packaged foods and beverages in the [place] It includes:  The serving size.  Information about nutrients in each serving, including the grams (g) of carbohydrate per serving. To use the "Nutrition Facts":  Decide how many servings you will have.  Multiply the number of servings by the number of carbohydrates per serving.  The resulting number is the total amount of carbohydrates that you will be having. Learning standard serving sizes of other foods  When you eat foods containing carbohydrates that are not packaged or do not include "Nutrition Facts" on the label,  you need to measure the servings in order to count the amount of carbohydrates:  Measure the foods that you will eat with a food scale or measuring cup, if needed.  Decide how many standard-size servings you will eat.  Multiply the number of servings by 15. Most carbohydrate-rich foods have about 15 g of carbohydrates per serving.  For example, if you eat 8 oz (170 g) of strawberries, you will have eaten 2 servings and 30 g of carbohydrates (2 servings x 15 g = 30 g).  For foods that have more than one food mixed, such as soups and casseroles, you must count the carbohydrates in each food that is included. The following list contains standard serving sizes of common carbohydrate-rich foods. Each of these servings has about 15 g of carbohydrates:   hamburger bun or  English muffin.   oz (15 mL) syrup.   oz (14 g) jelly.  1 slice of bread.  1 six-inch tortilla.  3 oz (85 g) cooked rice or pasta.  4 oz (113 g) cooked dried beans.  4 oz (113 g) starchy vegetable, such as peas, corn, or potatoes.  4 oz (113 g) hot cereal.  4 oz (113 g) mashed potatoes or  of a large baked potato.  4 oz (113 g) canned or frozen fruit.  4 oz (120 mL) fruit juice.  4-6 crackers.  6 chicken nuggets.  6 oz (170 g) unsweetened dry cereal.  6 oz (170 g) plain fat-free yogurt or yogurt sweetened with artificial sweeteners.  8 oz (240 mL) milk.  8 oz (170 g) fresh  fruit or one small piece of fruit.  24 oz (680 g) popped popcorn. Example of carbohydrate counting Sample meal   3 oz (85 g) chicken breast.  6 oz (170 g) brown rice.  4 oz (113 g) corn.  8 oz (240 mL) milk.  8 oz (170 g) strawberries with sugar-free whipped topping. Carbohydrate calculation  1. Identify the foods that contain carbohydrates:  Rice.  Corn.  Milk.  Strawberries. 2. Calculate how many servings you have of each food:  2 servings rice.  1 serving corn.  1 serving milk.  1 serving  strawberries. 3. Multiply each number of servings by 15 g:  2 servings rice x 15 g = 30 g.  1 serving corn x 15 g = 15 g.  1 serving milk x 15 g = 15 g.  1 serving strawberries x 15 g = 15 g. 4. Add together all of the amounts to find the total grams of carbohydrates eaten:  30 g + 15 g + 15 g + 15 g = 75 g of carbohydrates total. This information is not intended to replace advice given to you by your health care provider. Make sure you discuss any questions you have with your health care provider. Document Released: 06/15/2005 Document Revised: 01/03/2016 Document Reviewed: 11/27/2015 Elsevier Interactive Patient Education  2017 [employer]

## 2016-09-18 ENCOUNTER — Other Ambulatory Visit: Payer: Self-pay | Admitting: *Deleted

## 2016-09-18 ENCOUNTER — Ambulatory Visit: Payer: Self-pay

## 2016-09-18 DIAGNOSIS — E785 Hyperlipidemia, unspecified: Secondary | ICD-10-CM

## 2016-09-18 DIAGNOSIS — E1169 Type 2 diabetes mellitus with other specified complication: Secondary | ICD-10-CM

## 2016-09-18 DIAGNOSIS — Z9861 Coronary angioplasty status: Principal | ICD-10-CM

## 2016-09-18 DIAGNOSIS — E7800 Pure hypercholesterolemia, unspecified: Secondary | ICD-10-CM

## 2016-09-18 DIAGNOSIS — I2510 Atherosclerotic heart disease of native coronary artery without angina pectoris: Secondary | ICD-10-CM

## 2016-09-18 LAB — LIPID PANEL
Chol/HDL Ratio: 2.7 ratio units (ref 0.0–5.0)
Cholesterol, Total: 104 mg/dL (ref 100–199)
HDL: 39 mg/dL — ABNORMAL LOW (ref >39)
LDL Calculated: 50 mg/dL (ref 0–99)
Triglycerides: 76 mg/dL (ref 0–149)
VLDL Cholesterol Cal: 15 mg/dL (ref 5–40)

## 2016-09-18 NOTE — Addendum Note (Signed)
Addended by: Tonita PhoenixBOWDEN, ROBIN K on: 09/18/2016 10:28 AM   Modules accepted: Orders

## 2016-09-18 NOTE — Progress Notes (Signed)
D

## 2016-09-21 ENCOUNTER — Ambulatory Visit: Payer: Self-pay | Admitting: Internal Medicine

## 2016-09-21 ENCOUNTER — Other Ambulatory Visit: Payer: Self-pay

## 2016-09-21 ENCOUNTER — Telehealth: Payer: Self-pay | Admitting: *Deleted

## 2016-09-21 MED FILL — TRUEPLUS SYR 0.5ML 31GX5/16: 31G X 5/16" | 30 days supply | Qty: 100 | Fill #5

## 2016-09-21 NOTE — Telephone Encounter (Signed)
[facility] lab work ok, continue current medications. If any questions please call [phone].

## 2016-09-22 ENCOUNTER — Ambulatory Visit (INDEPENDENT_AMBULATORY_CARE_PROVIDER_SITE_OTHER): Payer: Self-pay | Admitting: Pharmacist

## 2016-09-22 ENCOUNTER — Ambulatory Visit: Payer: Self-pay | Admitting: Internal Medicine

## 2016-09-22 ENCOUNTER — Encounter: Payer: Self-pay | Admitting: Pharmacist

## 2016-09-22 DIAGNOSIS — E7800 Pure hypercholesterolemia, unspecified: Secondary | ICD-10-CM

## 2016-09-22 NOTE — Patient Instructions (Signed)
Continue rosuvastatin (Crestor) 40 mg daily and ezetimibe (Zetia) 10 mg daily. We are very pleased that your LDL (bad cholesterol) < 70! Keep up the good work!  Continue to watch your portion size. Let us know if you need any assistance with smoking cessation.    Cholesterol Cholesterol is a white, waxy, fat-like substance that is needed by the human body in small amounts. The liver makes all the cholesterol we need. Cholesterol is carried from the liver by the blood through the blood vessels. Deposits of cholesterol (plaques) may build up on blood vessel (artery) walls. Plaques make the arteries narrower and stiffer. Cholesterol plaques increase the risk for heart attack and stroke. You cannot feel your cholesterol level even if it is very high. The only way to know that it is high is to have a blood test. Once you know your cholesterol levels, you should keep a record of the test results. Work with your health care provider to keep your levels in the desired range. What do the results mean?  Total cholesterol is a rough measure of all the cholesterol in your blood.  LDL (low-density lipoprotein) is the "bad" cholesterol. This is the type that causes plaque to build up on the artery walls. You want this level to be low.  HDL (high-density lipoprotein) is the "good" cholesterol because it cleans the arteries and carries the LDL away. You want this level to be high.  Triglycerides are fat that the body can either burn for energy or store. High levels are closely linked to heart disease. What are the desired levels of cholesterol?  Total cholesterol below 200.  LDL below 100 for people who are at risk, below 70 for people at very high risk.  HDL above 40 is good. A level of 60 or higher is considered to be protective against heart disease.  Triglycerides below 150. How can I lower my cholesterol? Diet  Follow your diet program as told by your health care provider.  Choose fish or white  meat chicken and [place], roasted or baked. Limit fatty cuts of red meat, fried foods, and processed meats, such as sausage and lunch meats.  Eat lots of fresh fruits and vegetables.  Choose whole grains, beans, pasta, potatoes, and cereals.  Choose olive oil, corn oil, or canola oil, and use only small amounts.  Avoid butter, mayonnaise, shortening, or palm kernel oils.  Avoid foods with trans fats.  Drink skim or nonfat milk and eat low-fat or nonfat yogurt and cheeses. Avoid whole milk, cream, ice cream, egg yolks, and full-fat cheeses.  Healthier desserts include angel food cake, ginger snaps, animal crackers, hard candy, popsicles, and low-fat or nonfat frozen yogurt. Avoid pastries, cakes, pies, and cookies. Exercise  Follow your exercise program as told by your health care provider. A regular program:  Helps to decrease LDL and raise HDL.  Helps with weight control.  Do things that increase your activity level, such as gardening, walking, and taking the stairs.  Ask your health care provider about ways that you can be more active in your daily life. Medicine  Take over-the-counter and prescription medicines only as told by your health care provider.  Medicine may be prescribed by your health care provider to help lower cholesterol and decrease the risk for heart disease. This is usually done if diet and exercise have failed to bring down cholesterol levels.  If you have several risk factors, you may need medicine even if your levels are normal. This  information is not intended to replace advice given to you by your health care provider. Make sure you discuss any questions you have with your health care provider. Document Released: 03/10/2001 Document Revised: 01/11/2016 Document Reviewed: 12/14/2015 Elsevier Interactive Patient Education  2017 [employer].

## 2016-09-22 NOTE — Progress Notes (Signed)
Patient ID: Marc Walker                 DOB: 08-Apr-1971                    MRN: [id number]     HPI: Marc Walker is a 46 y.o. male patient of Dr. Harrington Challenger that presents today for lipid evaluation.  PMH includes CAD s/p multiple PCI, DM, HTN, OSA, HL, and tobacco abuse. LHC demonstrated 80% mid RCA stenosis treated with DES, 90% proximal LAD and 70% mid LAD stenosis involving the previous stent treated with DES, 20% RPDA in-stent restenosis treated with angioplasty. The stent in OM2 was patent. Residual disease included 90% D2 stenosis and 70% mid LCx stenosis, treated medically. Marc Dopp, PA referred pt to lipid clinic for management.   Patient presents today and expresses that he knows he has dietary and lifestyle changes he could make to improve his overall health. He acknowledges that his portion sizes are too large, and that is one way he could make a change. He still smokes, and is not having success with his Nicotrol inhaler because he forgets to take it.   In terms of cholesterol medications, he reports no issues. He has been adherent to these medications after a "stern talking to" from his PCP.    LDL Goal: <70  Current Medications: Crestor 54m daily, Zetia 141mdaily  Intolerances: none  Diet: Usually cooks at home, and states that eats a well balanced diet, but that portion sizes are too big.   Family History: Adopted, and does not know any biological family history  Social History: Smokes a pack a day, has a nicotrol inhaler that he does not use often because he forgets about it.  Labs: 09/18/16 TC 104; TG 76;  HDL 39;  LDL 50 - on Crestor 4081maily, Zetia 10m83mily  08/21/16 TC 228; TG 180;  HDL 33;  LDL 159 - questionable adherence??  Past Medical History:  Diagnosis Date  . Coronary artery disease   . Diabetes mellitus without complication (HCC)[place]. Heartburn   . High cholesterol   . Hyperlipidemia associated with type 2 diabetes mellitus (HCC)[place]. Hypertension     . [place]) [place], last one 02/2014   . Morbid obesity (HCC)[place]8/2018  . OSA (obstructive sleep apnea) 07/06/2016   Moderate with AHI 21.8/hr by PSG 08/2012 now on CPAP  . Thyroid disease    past history    Current Outpatient Prescriptions on File Prior to Visit  Medication Sig Dispense Refill  . amLODipine (NORVASC) 5 MG tablet Take 1 tablet (5 mg total) by mouth daily. 30 tablet 6  . aspirin 81 MG tablet Take 1 tablet (81 mg total) by mouth daily. 90 tablet 3  . blood glucose meter kit and supplies KIT Dispense based on patient and insurance preference. Use up to four times daily as directed. (FOR ICD-9 250.00, 250.01). 1 each 0  . clopidogrel (PLAVIX) 75 MG tablet Take 1 tablet (75 mg total) by mouth daily. 30 tablet 10  . ezetimibe (ZETIA) 10 MG tablet Take 1 tablet (10 mg total) by mouth daily. 90 tablet 2  . famotidine (PEPCID) 20 MG tablet Take 1 tablet (20 mg total) by mouth 2 (two) times daily. 60 tablet 3  . furosemide (LASIX) 40 MG tablet  Take 1 tablet (40 mg total) by mouth 3 (three) times a week. 15 tablet 6  . gabapentin (NEURONTIN) 300 MG capsule Take 3 capsules (900 mg total) by mouth 3 (three) times daily. 180 capsule 3  . glucose blood (TRUE METRIX BLOOD GLUCOSE TEST) test strip Use as instructed 100 each 12  . hydrochlorothiazide (HYDRODIURIL) 25 MG tablet Take 1 tablet (25 mg total) by mouth daily. 90 tablet 3  . insulin aspart (NOVOLOG FLEXPEN) 100 UNIT/ML FlexPen Inject 16-20 Units into the skin 3 (three) times daily with meals. 16 units for smaller meals, 20 units for normal meals. 15 mL 2  . Insulin Glargine (LANTUS SOLOSTAR) 100 UNIT/ML Solostar Pen Inject 45 Units into the skin 2 (two) times daily. 5 pen 3  . insulin lispro (HUMALOG KWIKPEN) 100 UNIT/ML KiwkPen Inject 16-20 units into the skin 3 (three) times daily with meals. 16 units for smaller meals, 20  units for normal meals 90 mL 3  . Insulin Pen Needle 31G X 8 MM MISC Use as directed 100 each 5  . Insulin Syringes, Disposable, U-100 0.5 ML MISC Take insulin as directed   Diagnosis: Insulin dependent diabetes mellitus ICD10 250 1 each 0  . lisinopril (PRINIVIL,ZESTRIL) 10 MG tablet Take 1 tablet (10 mg total) by mouth 2 (two) times daily. 60 tablet 3  . metFORMIN (GLUCOPHAGE-XR) 500 MG 24 hr tablet TAKE 2 TABLETS BY MOUTH 2 TIMES DAILY WITH A MEAL. 120 tablet 2  . metoprolol (LOPRESSOR) 50 MG tablet Take 1 tablet (50 mg total) by mouth 2 (two) times daily. 90 tablet 11  . nicotine (NICOTROL) 10 MG inhaler Inhale 1 cartridge (1 continuous puffing total) into the lungs as needed for smoking cessation. 42 each 0  . nicotine polacrilex (NICORETTE) 4 MG gum Take 1 each (4 mg total) by mouth as needed for smoking cessation. 100 tablet 3  . nitroGLYCERIN (NITROSTAT) 0.4 MG SL tablet Place 1 tablet (0.4 mg total) under the tongue every 5 (five) minutes as needed for chest pain. 30 tablet 12  . oxybutynin (DITROPAN XL) 5 MG 24 hr tablet Take 1 tablet (5 mg total) by mouth at bedtime. 30 tablet 3  . potassium chloride (K-DUR) 10 MEQ tablet Take 1 tablet (10 mEq total) by mouth daily. 15 tablet 0  . rosuvastatin (CRESTOR) 40 MG tablet Take 1 tablet (40 mg total) by mouth daily. 90 tablet 3  . trimethoprim-polymyxin b (POLYTRIM) ophthalmic solution Place 1 drop into the left eye every 4 (four) hours. For 5 days 10 mL 0  . TRUEPLUS LANCETS 28G MISC Use as directed 100 each 12   No current facility-administered medications on file prior to visit.     Allergies  Allergen Reactions  . 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  Pt only wants pain medicine if absolutely necessary.  . Levofloxacin Rash    Rash on back (not sun exposed)and hypersensitivity to skin on exposed skin/arm    Assessment/Plan: Hyperlipidemia: Patient is at goal of LDL <70. Continue rosuvastatin 40 mg  daily and ezetimibe 10 mg daily. Patient was congratulated for his adherence, and provided a print out of his labs and stressed how important it is to continue adherence to keep his LDL controlled.    Patient was seen today with Catie Darnelle Maffucci, PharmD Candidate 2018.  Thank you,  Lelan Pons. [place], [facility]  09/22/2016 7:06 AM

## 2016-09-23 ENCOUNTER — Encounter: Payer: Self-pay | Admitting: Internal Medicine

## 2016-09-23 ENCOUNTER — Ambulatory Visit: Payer: Self-pay | Attending: Internal Medicine | Admitting: Internal Medicine

## 2016-09-23 VITALS — BP 131/84 | HR 67 | Temp 98.2°F | Resp 16 | Wt 287.6 lb

## 2016-09-23 DIAGNOSIS — Z9889 Other specified postprocedural states: Secondary | ICD-10-CM | POA: Insufficient documentation

## 2016-09-23 DIAGNOSIS — G4733 Obstructive sleep apnea (adult) (pediatric): Secondary | ICD-10-CM | POA: Insufficient documentation

## 2016-09-23 DIAGNOSIS — Z0001 Encounter for general adult medical examination with abnormal findings: Secondary | ICD-10-CM | POA: Insufficient documentation

## 2016-09-23 DIAGNOSIS — Z79899 Other long term (current) drug therapy: Secondary | ICD-10-CM | POA: Insufficient documentation

## 2016-09-23 DIAGNOSIS — I10 Essential (primary) hypertension: Secondary | ICD-10-CM | POA: Insufficient documentation

## 2016-09-23 DIAGNOSIS — Z7982 Long term (current) use of aspirin: Secondary | ICD-10-CM | POA: Insufficient documentation

## 2016-09-23 DIAGNOSIS — Z6841 Body Mass Index (BMI) 40.0 and over, adult: Secondary | ICD-10-CM | POA: Insufficient documentation

## 2016-09-23 DIAGNOSIS — Z794 Long term (current) use of insulin: Secondary | ICD-10-CM | POA: Insufficient documentation

## 2016-09-23 DIAGNOSIS — I2510 Atherosclerotic heart disease of native coronary artery without angina pectoris: Secondary | ICD-10-CM | POA: Insufficient documentation

## 2016-09-23 DIAGNOSIS — E1140 Type 2 diabetes mellitus with diabetic neuropathy, unspecified: Secondary | ICD-10-CM | POA: Insufficient documentation

## 2016-09-23 DIAGNOSIS — Z881 Allergy status to other antibiotic agents status: Secondary | ICD-10-CM | POA: Insufficient documentation

## 2016-09-23 DIAGNOSIS — Z9861 Coronary angioplasty status: Secondary | ICD-10-CM

## 2016-09-23 DIAGNOSIS — E1142 Type 2 diabetes mellitus with diabetic polyneuropathy: Secondary | ICD-10-CM

## 2016-09-23 LAB — GLUCOSE, POCT (MANUAL RESULT ENTRY): POC GLUCOSE: 113 mg/dL — AB (ref 70–99)

## 2016-09-23 MED ORDER — INSULIN GLARGINE 100 UNIT/ML SOLOSTAR PEN: 45.0000 [IU] | PEN_INJECTOR | Freq: Two times a day (BID) | SUBCUTANEOUS | 3 refills | Status: DC

## 2016-09-23 MED ORDER — INSULIN ASPART 100 UNIT/ML FLEXPEN: 16.0000 [IU] | PEN_INJECTOR | Freq: Three times a day (TID) | SUBCUTANEOUS | 3 refills | Status: DC

## 2016-09-23 NOTE — Progress Notes (Signed)
Marc Walker, is a 46 y.o. male  TMH:[id number]  [id number]  DOB - 07-Sep-1970  Chief Complaint  Patient presents with  . Diabetes        Subjective:   Marc Walker is a 46 y.o. male here today for a follow up visit of htn/dm, last seen 09/01/16, w/ hx of cad/osa.  Pt overall doing better since he was able to pick up his bp meds. He is taking them as scheduled. He notes he runs out of lantus the last few days prior to refills, he gets lantus in vials, rather than the penform at this time.  Walking more at work, more cautions of his intake as well.   Patient has No headache, No chest pain, No abdominal pain - No Nausea, No new weakness tingling or numbness, No Cough - SOB.  No cp.  No problems updated.  ALLERGIES: Allergies  Allergen Reactions  . 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  Pt only wants pain medicine if absolutely necessary.  . Levofloxacin Rash    Rash on back (not sun exposed)and hypersensitivity to skin on exposed skin/arm    PAST MEDICAL HISTORY: Past Medical History:  Diagnosis Date  . Coronary artery disease   . Diabetes mellitus without complication ([place])   . Heartburn   . High cholesterol   . Hyperlipidemia associated with type 2 diabetes mellitus ([place])   . Hypertension   . MI (myocardial infarction) 07/2009   [place] in [place], [place] for last 2, 1st one in [place], [place]; total of 6 stents, last one 02/2014   . Morbid obesity ([place]) 07/06/2016  . OSA (obstructive sleep apnea) 07/06/2016   Moderate with AHI 21.8/hr by PSG 08/2012 now on CPAP  . Thyroid disease    past history    MEDICATIONS AT HOME: Prior to Admission medications   Medication Sig Start Date End Date Taking? Authorizing Provider  amLODipine (NORVASC) 5 MG tablet Take 1 tablet (5 mg total) by mouth daily. 09/01/16 11/30/16  Maren Reamer, MD  aspirin 81 MG tablet Take 1 tablet (81 mg total) by mouth daily. 07/06/16   Maren Reamer, MD    blood glucose meter kit and supplies KIT Dispense based on patient and insurance preference. Use up to four times daily as directed. (FOR ICD-9 250.00, 250.01). 09/17/15   Ripudeep Krystal Eaton, MD  clopidogrel (PLAVIX) 75 MG tablet Take 1 tablet (75 mg total) by mouth daily. 09/01/16   Maren Reamer, MD  ezetimibe (ZETIA) 10 MG tablet Take 1 tablet (10 mg total) by mouth daily. 09/01/16 11/30/16  Maren Reamer, MD  famotidine (PEPCID) 20 MG tablet Take 1 tablet (20 mg total) by mouth 2 (two) times daily. 09/01/16   Maren Reamer, MD  furosemide (LASIX) 40 MG tablet Take 1 tablet (40 mg total) by mouth 3 (three) times a week. 05/25/16 08/23/16  Fay Records, MD  gabapentin (NEURONTIN) 300 MG capsule Take 3 capsules (900 mg total) by mouth 3 (three) times daily. 07/06/16   Maren Reamer, MD  glucose blood (TRUE METRIX BLOOD GLUCOSE TEST) test strip Use as instructed 09/01/16   Maren Reamer, MD  hydrochlorothiazide (HYDRODIURIL) 25 MG tablet Take 1 tablet (25 mg total) by mouth daily. 09/01/16   Maren Reamer, MD  insulin aspart (NOVOLOG FLEXPEN) 100 UNIT/ML FlexPen Inject 16-20 Units into the skin 3 (three) times daily with meals. [place]  units for smaller meals, 20 units for normal meals. 09/23/16   Maren Reamer, MD  Insulin Glargine (LANTUS SOLOSTAR) 100 UNIT/ML Solostar Pen Inject 45 Units into the skin 2 (two) times daily. 09/23/16   Maren Reamer, MD  insulin lispro (HUMALOG KWIKPEN) 100 UNIT/ML KiwkPen Inject 16-20 units into the skin 3 (three) times daily with meals. 16 units for smaller meals, 20 units for normal meals 08/18/16   Maren Reamer, MD  Insulin Pen Needle 31G X 8 MM MISC Use as directed 08/06/16   Maren Reamer, MD  Insulin Syringes, Disposable, U-100 0.5 ML MISC Take insulin as directed   Diagnosis: Insulin dependent diabetes mellitus ICD10 250 11/04/15   Maren Reamer, MD  lisinopril (PRINIVIL,ZESTRIL) 10 MG tablet Take 1 tablet (10 mg total) by mouth 2 (two) times daily. 09/01/16    Maren Reamer, MD  metFORMIN (GLUCOPHAGE-XR) 500 MG 24 hr tablet TAKE 2 TABLETS BY MOUTH 2 TIMES DAILY WITH A MEAL. 09/01/16   Maren Reamer, MD  metoprolol (LOPRESSOR) 50 MG tablet Take 1 tablet (50 mg total) by mouth 2 (two) times daily. 09/01/16 11/30/16  Maren Reamer, MD  nicotine (NICOTROL) 10 MG inhaler Inhale 1 cartridge (1 continuous puffing total) into the lungs as needed for smoking cessation. 09/30/15   Imogene Burn, PA-C  nicotine polacrilex (NICORETTE) 4 MG gum Take 1 each (4 mg total) by mouth as needed for smoking cessation. 07/21/16   Maren Reamer, MD  nitroGLYCERIN (NITROSTAT) 0.4 MG SL tablet Place 1 tablet (0.4 mg total) under the tongue every 5 (five) minutes as needed for chest pain. 09/17/15   Ripudeep Krystal Eaton, MD  oxybutynin (DITROPAN XL) 5 MG 24 hr tablet Take 1 tablet (5 mg total) by mouth at bedtime. 07/21/16   Maren Reamer, MD  potassium chloride (K-DUR) 10 MEQ tablet Take 1 tablet (10 mEq total) by mouth daily. 09/17/16 12/16/16  Tresa Garter, MD  rosuvastatin (CRESTOR) 40 MG tablet Take 1 tablet (40 mg total) by mouth daily. 09/01/16 11/30/16  Maren Reamer, MD  trimethoprim-polymyxin b (POLYTRIM) ophthalmic solution Place 1 drop into the left eye every 4 (four) hours. For 5 days 07/24/16   Noland Fordyce, PA-C  TRUEPLUS LANCETS 28G MISC Use as directed 09/01/16   Maren Reamer, MD     Objective:   Vitals:   09/23/16 0906  BP: 131/84  Pulse: 67  Resp: 16  Temp: 98.2 F (36.8 C)  TempSrc: Oral  SpO2: 97%  Weight: 287 lb 9.6 oz (130.5 kg)    Exam General appearance : Awake, alert, not in any distress. Speech Clear. Not toxic looking, morbid obese, pleasant. Morbid Obese. HEENT: Atraumatic and Normocephalic, pupils equally reactive to light. Neck: supple, no JVD.  Chest:Good air entry bilaterally, no added sounds. CVS: S1 S2 regular, no murmurs/gallups or rubs. Abdomen: Bowel sounds active, obese, Non tender, limited exam due to body  habitus. Extremities: B/L Lower Ext shows no edema, both legs are warm to touch Neurology: Awake alert, and oriented X 3, CN II-XII grossly intact, Non focal Skin:No Rash  Data Review Lab Results  Component Value Date   HGBA1C 7.8 (H) 07/10/2016   HGBA1C 7.7 07/06/2016   HGBA1C 8.1 03/23/2016    Depression screen PHQ 2/9 09/23/2016 09/01/2016 07/21/2016 07/06/2016 03/23/2016  Decreased Interest 0 0 0 0 0  Down, Depressed, Hopeless 0 0 0 0 0  PHQ - 2 Score 0 0 0 0  0  Altered sleeping - - - - -  Tired, decreased energy - - - - -  Change in appetite - - - - -  Feeling bad or failure about yourself  - - - - -  Trouble concentrating - - - - -  Moving slowly or fidgety/restless - - - - -  Suicidal thoughts - - - - -  PHQ-9 Score - - - - -  Difficult doing work/chores - - - - -      Assessment & Plan   1. DM type 2 with diabetic peripheral neuropathy (HCC) Much better controlled, runs out of lantus early he states - POCT glucose (manual entry) 118 - encouraged compliance w/ meds, increase activity recd.  2. Essential hypertension Well controlled now that he is on his meds! Same regimen. Low salt diet encouraged.  3. Morbid obesity ([place]) Continue to encouraged low carb diet for weight loss, not calorie counting, <50gm carbs/daily for weightloss, would need to watch his sugars closely b/c if he does do this, he will noticed his cbgs dropping and need to decrease insulin. - info on lc/hf/if diet again given.  4. OSA (obstructive sleep apnea) Continue to use cpap  5. CAD S/P multiple PCI's zetia/plavix/rosuvastatin/asa/metoprolol/lsinopril     Patient have been counseled extensively about nutrition and exercise  Return in about 2 months (around 11/23/2016), or if symptoms worsen or fail to improve.  The patient was given clear instructions to go to ER or return to medical center if symptoms don't improve, worsen or new problems develop. The patient verbalized understanding.  The patient was told to call to get lab results if they haven't heard anything in the next week.   This note has been created with Surveyor, quantity. Any transcriptional errors are unintentional.   Maren Reamer, MD, [facility] [place], [place]   09/23/2016, 9:22 AM

## 2016-09-23 NOTE — Patient Instructions (Addendum)
[facility] - dm and htn check.  -   Low-Sodium Eating Plan Sodium, which is an element that makes up salt, helps you maintain a healthy balance of fluids in your body. Too much sodium can increase your blood pressure and cause fluid and waste to be held in your body. Your health care provider or dietitian may recommend following this plan if you have high blood pressure (hypertension), kidney disease, liver disease, or heart failure. Eating less sodium can help lower your blood pressure, reduce swelling, and protect your heart, liver, and kidneys. What are tips for following this plan? General guidelines   Most people on this plan should limit their sodium intake to 1,500-2,000 mg (milligrams) of sodium each day. Reading food labels   The Nutrition Facts label lists the amount of sodium in one serving of the food. If you eat more than one serving, you must multiply the listed amount of sodium by the number of servings.  Choose foods with less than 140 mg of sodium per serving.  Avoid foods with 300 mg of sodium or more per serving. Shopping   Look for lower-sodium products, often labeled as "low-sodium" or "no salt added."  Always check the sodium content even if foods are labeled as "unsalted" or "no salt added".  Buy fresh foods.  Avoid canned foods and premade or frozen meals.  Avoid canned, cured, or processed meats  Buy breads that have less than 80 mg of sodium per slice. Cooking   Eat more home-cooked food and less restaurant, buffet, and fast food.  Avoid adding salt when cooking. Use salt-free seasonings or herbs instead of table salt or sea salt. Check with your health care provider or pharmacist before using salt substitutes.  Cook with plant-based oils, such as canola, sunflower, or olive oil. Meal planning   When eating at a restaurant, ask that your food be prepared with less salt or no salt, if possible.  Avoid foods that contain MSG  (monosodium glutamate). MSG is sometimes added to [place] food, bouillon, and some canned foods. What foods are recommended? The items listed may not be a complete list. Talk with your dietitian about what dietary choices are best for you. Grains  Low-sodium cereals, including oats, puffed wheat and rice, and shredded wheat. Low-sodium crackers. Unsalted rice. Unsalted pasta. Low-sodium bread. Whole-grain breads and whole-grain pasta. Vegetables  Fresh or frozen vegetables. "No salt added" canned vegetables. "No salt added" tomato sauce and paste. Low-sodium or reduced-sodium tomato and vegetable juice. Fruits  Fresh, frozen, or canned fruit. Fruit juice. Meats and other protein foods  Fresh or frozen (no salt added) meat, poultry, seafood, and fish. Low-sodium canned tuna and salmon. Unsalted nuts. Dried peas, beans, and lentils without added salt. Unsalted canned beans. Eggs. Unsalted nut butters. Dairy  Milk. Soy milk. Cheese that is naturally low in sodium, such as ricotta cheese, fresh mozzarella, or Swiss cheese Low-sodium or reduced-sodium cheese. Cream cheese. Yogurt. Fats and oils  Unsalted butter. Unsalted margarine with no trans fat. Vegetable oils such as canola or olive oils. Seasonings and other foods  Fresh and dried herbs and spices. Salt-free seasonings. Low-sodium mustard and ketchup. Sodium-free salad dressing. Sodium-free light mayonnaise. Fresh or refrigerated horseradish. Lemon juice. Vinegar. Homemade, reduced-sodium, or low-sodium soups. Unsalted popcorn and pretzels. Low-salt or salt-free chips. What foods are not recommended? The items listed may not be a complete list. Talk with your dietitian about what dietary choices are best for you. Grains  Instant hot cereals. Bread stuffing, pancake, and biscuit mixes. Croutons. Seasoned rice or pasta mixes. Noodle soup cups. Boxed or frozen macaroni and cheese. Regular salted crackers. Self-rising flour. Vegetables   Sauerkraut, pickled vegetables, and relishes. Olives. [place] fries. Onion rings. Regular canned vegetables (not low-sodium or reduced-sodium). Regular canned tomato sauce and paste (not low-sodium or reduced-sodium). Regular tomato and vegetable juice (not low-sodium or reduced-sodium). Frozen vegetables in sauces. Meats and other protein foods  Meat or fish that is salted, canned, smoked, spiced, or pickled. Bacon, ham, sausage, hotdogs, corned beef, chipped beef, packaged lunch meats, salt pork, jerky, pickled herring, anchovies, regular canned tuna, sardines, salted nuts. Dairy  Processed cheese and cheese spreads. Cheese curds. Blue cheese. Feta cheese. String cheese. Regular cottage cheese. Buttermilk. Canned milk. Fats and oils  Salted butter. Regular margarine. Ghee. Bacon fat. Seasonings and other foods  Onion salt, garlic salt, seasoned salt, table salt, and sea salt. Canned and packaged gravies. Worcestershire sauce. Tartar sauce. Barbecue sauce. Teriyaki sauce. Soy sauce, including reduced-sodium. Steak sauce. Fish sauce. Oyster sauce. Cocktail sauce. Horseradish that you find on the shelf. Regular ketchup and mustard. Meat flavorings and tenderizers. Bouillon cubes. Hot sauce and Tabasco sauce. Premade or packaged marinades. Premade or packaged taco seasonings. Relishes. Regular salad dressings. Salsa. Potato and tortilla chips. Corn chips and puffs. Salted popcorn and pretzels. Canned or dried soups. Pizza. Frozen entrees and pot pies. Summary  Eating less sodium can help lower your blood pressure, reduce swelling, and protect your heart, liver, and kidneys.  Most people on this plan should limit their sodium intake to 1,500-2,000 mg (milligrams) of sodium each day.  Canned, boxed, and frozen foods are high in sodium. Restaurant foods, fast foods, and pizza are also very high in sodium. You also get sodium by adding salt to food.  Try to cook at home, eat more fresh fruits and  vegetables, and eat less fast food, canned, processed, or prepared foods. This information is not intended to replace advice given to you by your health care provider. Make sure you discuss any questions you have with your health care provider. Document Released: 12/05/2001 Document Revised: 06/08/2016 Document Reviewed: 06/08/2016 Elsevier Interactive Patient Education  2017 [employer] -  Exercising to [employer] Minor[employer]ose Weight Exercising can help you to lose weight. In order to lose weight through exercise, you need to do vigorous-intensity exercise. You can tell that you are exercising with vigorous intensity if you are breathing very hard and fast and cannot hold a conversation while exercising. Moderate-intensity exercise helps to maintain your current weight. You can tell that you are exercising at a moderate level if you have a higher heart rate and faster breathing, but you are still able to hold a conversation. How often should I exercise? Choose an activity that you enjoy and set realistic goals. Your health care provider can help you to make an activity plan that works for you. Exercise regularly as directed by your health care provider. This may include:  Doing resistance training twice each week, such as:  Push-ups.  Sit-ups.  Lifting weights.  Using resistance bands.  Doing a given intensity of exercise for a given amount of time. Choose from these options:  150 minutes of moderate-intensity exercise every week.  75 minutes of vigorous-intensity exercise every week.  A mix of moderate-intensity and vigorous-intensity exercise every week. Children, pregnant women, people who are out of shape, people who are overweight, and older adults may need to consult a health care provider  for individual recommendations. If you have any sort of medical condition, be sure to consult your health care provider before starting a new exercise program. What are some activities that can help me to lose  weight?  Walking at a rate of at least 4.5 miles an hour.  Jogging or running at a rate of 5 miles per hour.  Biking at a rate of at least 10 miles per hour.  Lap swimming.  Roller-skating or in-line skating.  Cross-country skiing.  Vigorous competitive sports, such as football, basketball, and soccer.  Jumping rope.  Aerobic dancing. How can I be more active in my day-to-day activities?  Use the stairs instead of the elevator.  Take a walk during your lunch break.  If you drive, park your car farther away from work or school.  If you take public transportation, get off one stop early and walk the rest of the way.  Make all of your phone calls while standing up and walking around.  Get up, stretch, and walk around every 30 minutes throughout the day. What guidelines should I follow while exercising?  Do not exercise so much that you hurt yourself, feel dizzy, or get very short of breath.  Consult your health care provider prior to starting a new exercise program.  Wear comfortable clothes and shoes with good support.  Drink plenty of water while you exercise to prevent dehydration or heat stroke. Body water is lost during exercise and must be replaced.  Work out until you breathe faster and your heart beats faster. This information is not intended to replace advice given to you by your health care provider. Make sure you discuss any questions you have with your health care provider. Document Released: 07/18/2010 Document Revised: 11/21/2015 Document Reviewed: 11/16/2013 Elsevier Interactive Patient Education  2017 Else[employer] Inc.   -  QUICK START PATIENT GUIDE TO LCHF/IF LOW CARB HIGH FAT / INTERMITTENT FASTING  Recommend: <50 gram carbohydrate a day for weightloss.  What is this diet and how does it work? o Insulin is a hormone made by your body that allows you to use sugar (glucose) from carbohydrates in the food you eat for energy or to store glucose (as fat)  for future use  o Insulin levels need to be lowered in order to utilize our stored energy (fat) o Many struggling with obesity are insulin resistant and have high levels of insulin o This diet works to lower your insulin in two ways o Fasting - allows your insulin levels to naturally decrease  o Avoiding carbohydrates - carbs trigger increase in insulin Low Carb Healthy Fat (LCHF) o Get a free app for your phone, such as MyFitnessPal, to help you track your macronutrients (carbs/protein/fats) and to track your weight and body measurements to see your progress o Set your goal for around 10% carbs/20% protein/70% fat o A good starting goal for amount of net carbs per day is 50 grams (some will aim for 20 grams) o "Net carbs" refers to total grams of carbs minus grams of fiber (as fiber is not typically absorbed). For example, if a food has 5g total carb and 3g fiber, that would be 2g net carbs o Increase healthy fats - eg. olive oil, eggs, nuts, avocado, cheese, butter, coconut, meats, fish o Avoid high carb foods - eg. bread, pasta, potatoes, rice, cookies, soda, juice, anything sugary o Buy full-fat ingredients (avoid low-fat versions, which often have more sugar) o No need to count calories, but pay close  attention to grams of carbs on labels Intermittent Fasting (IF) o "Fasting" is going a period of time without eating - it helps to stay busy and well-hydrated o Purpose of fasting is to allow insulin levels to drop as low as possible, allowing your body to switch into fat-burning mode o With this diet there are many approaches to fasting, but 16:8 and 24hr fasts are commonly used o 16:8 fast, usually 5-7 days a week - Fasting for 16 hours of the day, then eating all meals for the day over course of 8 hours. o 24 hour fast, usually 1-3 days a week - Typically eating one meal a day, then fasting until the next day. Plenty of fluids (and some salt to help you hold onto fluids) are recommended  during longer fasts.  o During fasts certain beverages are still acceptable - water, sparkling water, bone broth, black tea or coffee, or tea/coffee with small amount of heavy whipping cream Special note for those on diabetic medications o Discuss your medications with your physician. You may need to hold your medication or adjust to only taking when eating. Diabetics should keep close track of their blood sugars when making any changes to diet/meds, to ensure they are staying within normal limits For more info about LCHF/IF o Watch "Therapeutic Fasting - Solving the Two-Compartment Problem" video by Dr. Wylene Simmer on YouTube ([email or web address]) for a great intro to these concepts o Read "The Obesity Code" and/or "The Complete Guide to Fasting" by Dr. Wylene Simmer o Go to www.dietdoctor.com for explanations, recipes, and infographics about foods to eat/avoid o Get a Free smartphone app that helps count carbohydrates  - ie MyKeto EXAMPLES TO GET STARTED Fasting Beverages -water (can add  tsp Pink Himalayan salt once or twice a day to help stay hydrated for longer fasts) -Sparkling water (such as [employer] or similar; avoid any with artificial sweeteners)  -Bone broth (multiple recipes available online or can buy pre-made) -Tea or Coffee (Adding heavy whipping cream or coconut oil to your tea or coffee can be helpful if you find yourself getting too hungry during the fasts. Can also add cinnamon for flavor. Or "bulletproof coffee.") Low Carb Healthy Fat Breakfast (if not fasting) -eggs in butter or olive oil with avocado -omelet with veggies and cheese  Lunch -hamburger with cheese and avocado wrapped in lettuce (no bun, no ketchup) -meat and cheese wrapped in lettuce (can dip in mustard or olive oil/vinegar/mayo) -salad with meat/cheese/nuts and higher fat dressing (vinaigrette or Ranch, etc) -tuna salad lettuce wrap -taco meat with cheese, sour cream, guacamole,  cheese over lettuce  Dinner -steak with herb butter or Barnaise sauce -"Fathead" pizza (uses cheese and almond flour for the dough - several recipes available online) -roasted or grilled chicken with skin on, with low carb sauce (buffalo, garlic butter, alfredo, pesto, etc) -baked salmon with lemon butter -chicken alfredo with zucchini noodles -[place] butter chicken with low carb garlic naan -egg roll in a bowl  Side Dishes -mashed cauliflower (homemade or available in freezer section) -roast vegetables (green veggies that grow above ground rather than root veggies) with butter or cheese -Caprese salad (fresh mozzarella, tomato and basil with olive oil) -homemade low-carb coleslaw Snacks/Desserts (try to avoid unnecessary snacking and sweets in general) -celery or cucumber dipped in guacamole or sour cream dip -cheese and meat slices  -raspberries with whipped cream (can make homemade with no sugar added) -low carb [place] butter cake  AVOID - sugar, diet/regular soda, potatoes,  breads, rice, pasta, candy, cookies, cakes, muffins, juice, high carb fruit (bananas, grapes), beer, ketchup, barbeque and other sweet sauces

## 2016-09-28 MED FILL — TRUEplus LANCETS 28G MISC: 25 days supply | Qty: 100 | Fill #1

## 2016-09-28 MED FILL — ?ROSUVASTATIN CALCIUM 40 MG: 40 MG | 30 days supply | Qty: 30 | Fill #1

## 2016-09-28 MED FILL — CLOPIDOGREL 75 MG TABLET: 75 | 30 days supply | Qty: 30 | Fill #1

## 2016-09-28 MED FILL — METOPROLOL TARTRATE 50 MG T: 50 | 30 days supply | Qty: 60 | Fill #1

## 2016-09-28 MED FILL — FUROSEMIDE 40 MG TABLET: 40 | 28 days supply | Qty: 12 | Fill #1

## 2016-09-28 MED FILL — GABAPENTIN 300 MG CAPSULE: 300 | 20 days supply | Qty: 180 | Fill #1

## 2016-09-28 MED FILL — ?FAMOTIDINE 20 MG TABLET: 20 | 30 days supply | Qty: 60 | Fill #1

## 2016-09-28 MED FILL — !NOVOLOG FLEXPEN SYRINGE 1: 100/ML | 12 days supply | Qty: 6 | Fill #2

## 2016-09-28 MED FILL — ?EZETIMIBE 10 MG TAB: 10 | 30 days supply | Qty: 30 | Fill #1

## 2016-09-28 MED FILL — METFORMIN HCL ER 500 MG TAB: 500 | 30 days supply | Qty: 120 | Fill #1

## 2016-09-28 MED FILL — ?LISINOPRIL 10 MG TABLET: 10 | 30 days supply | Qty: 60 | Fill #1

## 2016-09-28 MED FILL — ?AMLODIPINE BESYLATE 5 MG T: 5 | 30 days supply | Qty: 30 | Fill #3

## 2016-09-28 MED FILL — ?OXYBUTYNIN CL ER 5MG TABLE: 5 | 30 days supply | Qty: 30 | Fill #2

## 2016-09-28 MED FILL — TRUEPLUS PEN NDL 31GX3/16": 31G X 5 MM | 30 days supply | Qty: 100 | Fill #1

## 2016-09-28 MED FILL — HYDROCHLOROTHIAZIDE 25 MG T: 25 | 30 days supply | Qty: 30 | Fill #1

## 2016-09-28 MED FILL — TRUEPLUS PEN NDL 31GX3/16: 31G X 5 MM | 30 days supply | Qty: 100 | Fill #1

## 2016-09-28 NOTE — Progress Notes (Deleted)
Cardiology Office Note    Date:  09/28/2016   ID:  Marc Walker, DOB 07-05-1970, MRN [id number]  PCP:  Maren Reamer, MD  Cardiologist: Dr. Harrington Challenger  Chief Complaint: 3  Months follow up for CAD, HTN and HL  History of Present Illness:   Marc Walker is a 46 y.o. male with hx of CAD s/p multiple PCI, HTN, HLD, DM, OSA on CPAPand tobacco abuse presents for follow up.   LHC 08/2015 demonstrated 80% mid RCA stenosis treated with DES, 90% proximal LAD and 70% mid LAD stenosis involving the previous stent treated with DES, 20% RPDA in-stent restenosis treated with angioplasty. The stent in OM2 was patent. Residual disease included 90% D2 stenosis and 70% mid LCx stenosis, treated medically.   Myoview 02/2016 demonstrated inferolateral and apical ischemia of mild severity. Overall, study was low risk. Dr. Harrington Challenger recomended continue medical therapy.  Last echo 06/2015 showed normal Lv function with grade 2 DD. Seen in lipid clinic 09/22/16 and continued Lipitor 28m and ezetimibe 171mqd.   Here today for follow up.   Past Medical History:  Diagnosis Date  . Coronary artery disease   . Diabetes mellitus without complication ([place]  . Heartburn   . High cholesterol   . Hyperlipidemia associated with type 2 diabetes mellitus ([facility] [place]  . Hypertension   . MI (myocardial infarction) 07/2009   [place], last one 02/2014   . Morbid obesity ([place]/01/2017  . OSA (obstructive sleep apnea) 07/06/2016   Moderate with AHI 21.8/hr by PSG 08/2012 now on CPAP  . Thyroid disease    past history    Past Surgical History:  Procedure Laterality Date  . APPENDECTOMY    . CARDIAC CATHETERIZATION N/A 09/16/2015   Procedure: Left Heart Cath and Coronary Angiography;  Surgeon: JoLorretta HarpMD;  [facility];  Service: Cardiovascular;  Laterality: N/A;  . CARDIAC CATHETERIZATION N/A 09/16/2015   Procedure:  Coronary Stent Intervention;  Surgeon: JoLorretta HarpMD;  [facility];  Service: Cardiovascular;  Laterality: N/A;  . CORONARY STENT PLACEMENT      Current Medications: Prior to Admission medications   Medication Sig Start Date End Date Taking? Authorizing Provider  amLODipine (NORVASC) 5 MG tablet Take 1 tablet (5 mg total) by mouth daily. 09/01/16 11/30/16  DaMaren ReamerMD  aspirin 81 MG tablet Take 1 tablet (81 mg total) by mouth daily. 07/06/16   DaMaren ReamerMD  blood glucose meter kit and supplies KIT Dispense based on patient and insurance preference. Use up to four times daily as directed. (FOR ICD-9 250.00, 250.01). 09/17/15   Ripudeep K Krystal EatonMD  clopidogrel (PLAVIX) 75 MG tablet Take 1 tablet (75 mg total) by mouth daily. 09/01/16   DaMaren ReamerMD  ezetimibe (ZETIA) 10 MG tablet Take 1 tablet (10 mg total) by mouth daily. 09/01/16 11/30/16  DaMaren ReamerMD  famotidine (PEPCID) 20 MG tablet Take 1 tablet (20 mg total) by mouth 2 (two) times daily. 09/01/16   DaMaren ReamerMD  furosemide (LASIX) 40 MG tablet Take 1 tablet (40 mg total) by mouth 3 (three) times a week. 05/25/16 08/23/16  PaFay RecordsMD  gabapentin (NEURONTIN) 300 MG capsule Take 3 capsules (900 mg total) by mouth 3 (three) times daily. 07/06/16   DaMaren ReamerMD  glucose blood (TRUE METRIX BLOOD GLUCOSE TEST) test strip Use as instructed 09/01/16   Maren Reamer, MD  hydrochlorothiazide (HYDRODIURIL) 25 MG tablet Take 1 tablet (25 mg total) by mouth daily. 09/01/16   Maren Reamer, MD  insulin aspart (NOVOLOG FLEXPEN) 100 UNIT/ML FlexPen Inject 16-20 Units into the skin 3 (three) times daily with meals. 16 units for smaller meals, 20 units for normal meals. 09/23/16   Maren Reamer, MD  Insulin Glargine (LANTUS SOLOSTAR) 100 UNIT/ML Solostar Pen Inject 45 Units into the skin 2 (two) times daily. 09/23/16   Maren Reamer, MD  insulin lispro (HUMALOG KWIKPEN) 100 UNIT/ML KiwkPen Inject  16-20 units into the skin 3 (three) times daily with meals. 16 units for smaller meals, 20 units for normal meals 08/18/16   Maren Reamer, MD  Insulin Pen Needle 31G X 8 MM MISC Use as directed 08/06/16   Maren Reamer, MD  Insulin Syringes, Disposable, U-100 0.5 ML MISC Take insulin as directed   Diagnosis: Insulin dependent diabetes mellitus ICD10 250 11/04/15   Maren Reamer, MD  lisinopril (PRINIVIL,ZESTRIL) 10 MG tablet Take 1 tablet (10 mg total) by mouth 2 (two) times daily. 09/01/16   Maren Reamer, MD  metFORMIN (GLUCOPHAGE-XR) 500 MG 24 hr tablet TAKE 2 TABLETS BY MOUTH 2 TIMES DAILY WITH A MEAL. 09/01/16   Maren Reamer, MD  metoprolol (LOPRESSOR) 50 MG tablet Take 1 tablet (50 mg total) by mouth 2 (two) times daily. 09/01/16 11/30/16  Maren Reamer, MD  nicotine (NICOTROL) 10 MG inhaler Inhale 1 cartridge (1 continuous puffing total) into the lungs as needed for smoking cessation. 09/30/15   Imogene Burn, PA-C  nicotine polacrilex (NICORETTE) 4 MG gum Take 1 each (4 mg total) by mouth as needed for smoking cessation. 07/21/16   Maren Reamer, MD  nitroGLYCERIN (NITROSTAT) 0.4 MG SL tablet Place 1 tablet (0.4 mg total) under the tongue every 5 (five) minutes as needed for chest pain. 09/17/15   Ripudeep Krystal Eaton, MD  oxybutynin (DITROPAN XL) 5 MG 24 hr tablet Take 1 tablet (5 mg total) by mouth at bedtime. 07/21/16   Maren Reamer, MD  potassium chloride (K-DUR) 10 MEQ tablet Take 1 tablet (10 mEq total) by mouth daily. 09/17/16 12/16/16  Tresa Garter, MD  rosuvastatin (CRESTOR) 40 MG tablet Take 1 tablet (40 mg total) by mouth daily. 09/01/16 11/30/16  Maren Reamer, MD  trimethoprim-polymyxin b (POLYTRIM) ophthalmic solution Place 1 drop into the left eye every 4 (four) hours. For 5 days 07/24/16   Noland Fordyce, PA-C  TRUEPLUS LANCETS 28G MISC Use as directed 09/01/16   Maren Reamer, MD    Allergies:   Other and Levofloxacin   Social History   Social History  .  Marital status: Divorced    Spouse name: N/A  . Number of children: N/A  . Years of education: N/A   Occupational History  . Biochemist, clinical    Social History Main Topics  . Smoking status: Current Every Day Smoker    Packs/day: 1.00    Years: 33.00    Types: Cigarettes  . Smokeless tobacco: Never Used  . Alcohol use Walker  . Drug use: Walker  . Sexual activity: Not on file   Other Topics Concern  . Not on file   Social History Narrative   Adopted. Very little info on biological parents, ?diabetes.   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 He was adopted. ***  ROS:   Please see the history of present illness.    ROS All other systems reviewed and are negative.   PHYSICAL EXAM:   VS:  There were Walker vitals taken for this visit.   GEN: Well nourished, well developed, in Walker acute distress  HEENT: normal  Neck: Walker JVD, carotid bruits, or masses Cardiac: ***RRR; Walker murmurs, rubs, or gallops,Walker edema  Respiratory:  clear to auscultation bilaterally, normal work of breathing GI: soft, nontender, nondistended, + BS MS: Walker deformity or atrophy  Skin: warm and dry, Walker rash Neuro:  Alert and Oriented x 3, Strength and sensation are intact Psych: euthymic mood, full affect  Wt Readings from Last 3 Encounters:  09/23/16 287 lb 9.6 oz (130.5 kg)  09/01/16 288 lb 6.4 oz (130.8 kg)  07/21/16 282 lb 12.8 oz (128.3 kg)      Studies/Labs Reviewed:   EKG:  EKG is ordered today.  The ekg ordered today demonstrates ***  Recent Labs: 07/09/2016: B Natriuretic Peptide 71.4 07/11/2016: BUN 17; Creatinine, Ser 0.69; Hemoglobin 12.6; Platelets 202; Potassium 3.9; Sodium 135 08/21/2016: ALT 28   Lipid Panel    Component Value Date/Time   CHOL 104 09/18/2016 1028   TRIG 76 09/18/2016 1028   HDL 39 (L) 09/18/2016 1028   CHOLHDL 2.7 09/18/2016 1028   CHOLHDL 2.9 07/10/2016 0416   VLDL 18 07/10/2016  0416   LDLCALC 50 09/18/2016 1028    Additional studies/ records that were reviewed today include:   Echocardiogram: 06/2016 Study Conclusions  - Left ventricle: The cavity size was normal. Wall thickness was   increased in a pattern of mild LVH. Systolic function was normal.   The estimated ejection fraction was in the range of 60% to 65%.   Features are consistent with a pseudonormal left ventricular   filling pattern, with concomitant abnormal relaxation and   increased filling pressure (grade 2 diastolic dysfunction). - Left atrium: The atrium was mildly dilated. - Impressions: Technically limited study due to poor sound wave   transmission. Walker obvious wall motion abnormalities with Definity   contrast imaging.  Impressions:  - Technically limited study due to poor sound wave transmission. Walker   obvious wall motion abnormalities with Definity contrast imaging.   Myoview 02/2016 Study Highlights     Nuclear stress EF: 55%.  There was Walker ST segment deviation noted during stress.  Defect 1: There is a medium defect of mild severity present in the basal inferolateral, mid inferolateral and apical inferior location.  Findings consistent with ischemia.  This is a low risk study.   Low risk stress nuclear study with mild ischemia in the distribution of a distal OM or PLA branch. Normal left ventricular regional and global systolic function.      Cardiac Catheterization: 08/2015 Conclusion    Mid Cx lesion, 70% stenosed.  Prox LAD to Mid LAD lesion, 90% stenosed.  2nd Diag lesion, 90% stenosed.  Mid RCA to Dist RCA lesion, 20% stenosed. The lesion was previously treated with a stent (unknown type).  Ost RPDA to RPDA lesion, 20% stenosed. Post intervention, there is a 20% residual stenosis. The lesion was previously treated with a stent (unknown type).  Mid RCA lesion, 80% stenosed. Post intervention, there is a 0% residual stenosis.  Mid LAD to Dist LAD  lesion, 70% stenosed. Post intervention, there is a 0% residual stenosis. The lesion  was previously treated with a stent (unknown type).   IMPRESSION:successful proximal LAD PCI and drug-eluting stenting as well as mid RCA along with Angiosculpt balloon angioplasty of the proximal segmental PDA "in-stent restenosis. The patient tolerated the procedure well. He left the lab in stable condition. The sheaths were removed after the Angiomax has been discontinued and pressure will be held. He'll be hydrated overnight and discharged home in the morning on antiplatelet therapy. Cardiac risk factor modification including smoking cessation should be encouraged.   ASSESSMENT & PLAN:    1. CAD  2. HTN  3. HLD - 07/10/2016: VLDL 18 09/18/2016: Cholesterol, Total 104; HDL 39; LDL Calculated 50; Triglycerides 76  - Continue current therapy  4. OSA on CPAP    Medication Adjustments/Labs and Tests Ordered: Current medicines are reviewed at length with the patient today.  Concerns regarding medicines are outlined above.  Medication changes, Labs and Tests ordered today are listed in the Patient Instructions below. There are Walker Patient Instructions on file for this visit.   Jarrett Soho, [place]  09/28/2016 2:39 PM    [facility] [address] [place], [place], [place]  [address] Phone: [phone]; Fax: [phone]

## 2016-09-30 MED FILL — $LANTUS 100 UNITS/ML VIAL: 100 | 21 days supply | Qty: 20 | Fill #0

## 2016-10-01 ENCOUNTER — Other Ambulatory Visit: Payer: Self-pay | Admitting: Family Medicine

## 2016-10-01 ENCOUNTER — Encounter: Payer: Self-pay | Admitting: Family Medicine

## 2016-10-01 ENCOUNTER — Ambulatory Visit: Payer: Self-pay | Attending: Family Medicine | Admitting: Family Medicine

## 2016-10-01 ENCOUNTER — Ambulatory Visit: Payer: Self-pay | Admitting: Physician Assistant

## 2016-10-01 VITALS — BP 92/62 | HR 73 | Temp 97.7°F | Resp 18 | Ht 65.0 in | Wt 284.8 lb

## 2016-10-01 DIAGNOSIS — K047 Periapical abscess without sinus: Secondary | ICD-10-CM | POA: Insufficient documentation

## 2016-10-01 DIAGNOSIS — Z7982 Long term (current) use of aspirin: Secondary | ICD-10-CM | POA: Insufficient documentation

## 2016-10-01 DIAGNOSIS — K029 Dental caries, unspecified: Secondary | ICD-10-CM | POA: Insufficient documentation

## 2016-10-01 DIAGNOSIS — E1142 Type 2 diabetes mellitus with diabetic polyneuropathy: Secondary | ICD-10-CM | POA: Insufficient documentation

## 2016-10-01 DIAGNOSIS — Z794 Long term (current) use of insulin: Secondary | ICD-10-CM | POA: Insufficient documentation

## 2016-10-01 DIAGNOSIS — Z79899 Other long term (current) drug therapy: Secondary | ICD-10-CM | POA: Insufficient documentation

## 2016-10-01 DIAGNOSIS — F17210 Nicotine dependence, cigarettes, uncomplicated: Secondary | ICD-10-CM | POA: Insufficient documentation

## 2016-10-01 LAB — GLUCOSE, POCT (MANUAL RESULT ENTRY): POC GLUCOSE: 191 mg/dL — AB (ref 70–99)

## 2016-10-01 MED ORDER — PENICILLIN V POTASSIUM 500 MG PO TABS: 500.0000 mg | ORAL_TABLET | Freq: Three times a day (TID) | ORAL | 0 refills | Status: AC

## 2016-10-01 MED ORDER — BENZOCAINE 10 % MT GEL: 1.0000 "application " | OROMUCOSAL | 0 refills | Status: DC | PRN

## 2016-10-01 MED ORDER — IBUPROFEN 800 MG PO TABS: 800.0000 mg | ORAL_TABLET | Freq: Three times a day (TID) | ORAL | 0 refills | Status: DC | PRN

## 2016-10-01 MED FILL — PENICILLIN VK 500 MG TABLET: 500 | 5 days supply | Qty: 15 | Fill #0

## 2016-10-01 MED FILL — POTASSIUM CL ER 10 MEQ CAP: 10 | 15 days supply | Qty: 15 | Fill #2

## 2016-10-01 MED FILL — IBUPROFEN 800 MG TABLET: 800 | 13 days supply | Qty: 40 | Fill #0

## 2016-10-01 NOTE — Progress Notes (Signed)
Patient is here for left swollen jaw  Patient complains about pain in his jaw  Patient stated that he tooth about to come is loose

## 2016-10-01 NOTE — Patient Instructions (Addendum)
If you develop any difficulty breathing, shortness of breath, tongue or throat swelling go to the Emergency Department.  Dental Abscess A dental abscess is pus in or around a tooth. Follow these instructions at home:  Take medicines only as told by your dentist.  If you were prescribed antibiotic medicine, finish all of it even if you start to feel better.  Rinse your mouth (gargle) often with salt water.  Do not drive or use heavy machinery, like a lawn mower, while taking pain medicine.  Do not apply heat to the outside of your mouth.  Keep all follow-up visits as told by your dentist. This is important. Contact a doctor if:  Your pain is worse, and medicine does not help. Get help right away if:  You have a fever or chills.  Your symptoms suddenly get worse.  You have a very bad headache.  You have problems breathing or swallowing.  You have trouble opening your mouth.  You have puffiness (swelling) in your neck or around your eye. This information is not intended to replace advice given to you by your health care provider. Make sure you discuss any questions you have with your health care provider. Document Released: 10/30/2014 Document Revised: 11/21/2015 Document Reviewed: 06/12/2014 Elsevier Interactive Patient Education  2017 [employer].

## 2016-10-01 NOTE — Progress Notes (Signed)
Subjective:  Patient ID: Marc Walker, male    DOB: December 25, 1970  Age: 46 y.o. MRN: [id number]  CC: Establish Care   HPI Art Levan presents for   Dental problem:Reports dental pain and gum swelling to left lower molar. Reports history of dental appointment where it was suggested that he have his teeth removed. He reports not getting teeth removed due to financial constraint. He is a current smoker he reports smoking 1 pack per day. Reports smoking history of 23 years. He denies any gum lesions. He reports taking Orajel and over-the-counter ibuprofen with minimal relief of symptoms.   Outpatient Medications Prior to Visit  Medication Sig Dispense Refill  . amLODipine (NORVASC) 5 MG tablet Take 1 tablet (5 mg total) by mouth daily. 30 tablet 6  . aspirin 81 MG tablet Take 1 tablet (81 mg total) by mouth daily. 90 tablet 3  . blood glucose meter kit and supplies KIT Dispense based on patient and insurance preference. Use up to four times daily as directed. (FOR ICD-9 250.00, 250.01). 1 each 0  . clopidogrel (PLAVIX) 75 MG tablet Take 1 tablet (75 mg total) by mouth daily. 30 tablet 10  . ezetimibe (ZETIA) 10 MG tablet Take 1 tablet (10 mg total) by mouth daily. 90 tablet 2  . famotidine (PEPCID) 20 MG tablet Take 1 tablet (20 mg total) by mouth 2 (two) times daily. 60 tablet 3  . furosemide (LASIX) 40 MG tablet Take 1 tablet (40 mg total) by mouth 3 (three) times a week. 15 tablet 6  . gabapentin (NEURONTIN) 300 MG capsule Take 3 capsules (900 mg total) by mouth 3 (three) times daily. 180 capsule 3  . glucose blood (TRUE METRIX BLOOD GLUCOSE TEST) test strip Use as instructed 100 each 12  . hydrochlorothiazide (HYDRODIURIL) 25 MG tablet Take 1 tablet (25 mg total) by mouth daily. 90 tablet 3  . insulin aspart (NOVOLOG FLEXPEN) 100 UNIT/ML FlexPen Inject 16-20 Units into the skin 3 (three) times daily with meals. 16 units for smaller meals, 20 units for normal meals. 15 mL 3  . Insulin  Glargine (LANTUS SOLOSTAR) 100 UNIT/ML Solostar Pen Inject 45 Units into the skin 2 (two) times daily. 7 pen 3  . insulin lispro (HUMALOG KWIKPEN) 100 UNIT/ML KiwkPen Inject 16-20 units into the skin 3 (three) times daily with meals. 16 units for smaller meals, 20 units for normal meals 90 mL 3  . Insulin Pen Needle 31G X 8 MM MISC Use as directed 100 each 5  . Insulin Syringes, Disposable, U-100 0.5 ML MISC Take insulin as directed   Diagnosis: Insulin dependent diabetes mellitus ICD10 250 1 each 0  . lisinopril (PRINIVIL,ZESTRIL) 10 MG tablet Take 1 tablet (10 mg total) by mouth 2 (two) times daily. 60 tablet 3  . metFORMIN (GLUCOPHAGE-XR) 500 MG 24 hr tablet TAKE 2 TABLETS BY MOUTH 2 TIMES DAILY WITH A MEAL. 120 tablet 2  . metoprolol (LOPRESSOR) 50 MG tablet Take 1 tablet (50 mg total) by mouth 2 (two) times daily. 90 tablet 11  . nicotine (NICOTROL) 10 MG inhaler Inhale 1 cartridge (1 continuous puffing total) into the lungs as needed for smoking cessation. 42 each 0  . nicotine polacrilex (NICORETTE) 4 MG gum Take 1 each (4 mg total) by mouth as needed for smoking cessation. 100 tablet 3  . nitroGLYCERIN (NITROSTAT) 0.4 MG SL tablet Place 1 tablet (0.4 mg total) under the tongue every 5 (five) minutes as needed for chest pain. [place]  tablet 12  . oxybutynin (DITROPAN XL) 5 MG 24 hr tablet Take 1 tablet (5 mg total) by mouth at bedtime. 30 tablet 3  . potassium chloride (K-DUR) 10 MEQ tablet Take 1 tablet (10 mEq total) by mouth daily. 15 tablet 0  . rosuvastatin (CRESTOR) 40 MG tablet Take 1 tablet (40 mg total) by mouth daily. 90 tablet 3  . trimethoprim-polymyxin b (POLYTRIM) ophthalmic solution Place 1 drop into the left eye every 4 (four) hours. For 5 days 10 mL 0  . TRUEPLUS LANCETS 28G MISC Use as directed 100 each 12   No facility-administered medications prior to visit.     ROS Review of Systems  HENT: Positive for dental problem.   Respiratory: Negative.   Cardiovascular:  Negative.     Objective:  BP 92/62   Pulse 73   Temp 97.7 F (36.5 C) (Oral)   Resp 18   Ht '5\' 5"'  (1.651 m)   Wt 284 lb 12.8 oz (129.2 kg)   SpO2 96%   BMI 47.39 kg/m   BP/Weight 10/01/2016 09/23/2016 5/80/9983  Systolic BP 92 382 505  Diastolic BP 62 84 87  Wt. (Lbs) 284.8 287.6 -  BMI 47.39 46.42 -     Physical Exam  HENT:  Head: Normocephalic.  Right Ear: External ear normal.  Left Ear: External ear normal.  Nose: Nose normal.  Mouth/Throat: Oropharynx is clear and moist. Dental abscesses and dental caries present.    Eyes: Conjunctivae are normal. Pupils are equal, round, and reactive to light.  Cardiovascular: Normal rate, regular rhythm, normal heart sounds and intact distal pulses.   Pulmonary/Chest: Effort normal and breath sounds normal.  Abdominal: Soft. Bowel sounds are normal.  Skin: Skin is warm and dry.  Nursing note and vitals reviewed.   Assessment & Plan:   Problem List Items Addressed This Visit      Endocrine   DM type 2 with diabetic peripheral neuropathy (HCC)   Relevant Orders   Glucose (CBG) (Completed)    Other Visit Diagnoses    Dental abscess    -  Primary   Relevant Medications   ibuprofen (ADVIL,MOTRIN) 800 MG tablet   benzocaine (ORAJEL) 10 % mucosal gel   penicillin v potassium (VEETID) 500 MG tablet   Dental caries       Relevant Medications   ibuprofen (ADVIL,MOTRIN) 800 MG tablet   benzocaine (ORAJEL) 10 % mucosal gel      Meds ordered this encounter  Medications  . ibuprofen (ADVIL,MOTRIN) 800 MG tablet    Sig: Take 1 tablet (800 mg total) by mouth every 8 (eight) hours as needed for moderate pain (Take with food.).    Dispense:  40 tablet    Refill:  0    Order Specific Question:   Supervising Provider    Answer:   Tresa Garter [id number]  . benzocaine (ORAJEL) 10 % mucosal gel    Sig: Use as directed 1 application in the mouth or throat as needed for mouth pain.    Dispense:  5.3 g    Refill:  0    Order  Specific Question:   Supervising Provider    Answer:   Tresa Garter [id number]  . penicillin v potassium (VEETID) 500 MG tablet    Sig: Take 1 tablet (500 mg total) by mouth every 8 (eight) hours.    Dispense:  15 tablet    Refill:  0    Order Specific Question:   Supervising Provider  AnswerTresa Garter [[id number]]    Follow-up: Return if symptoms worsen or fail to improve.   Alfonse Spruce FNP

## 2016-10-19 NOTE — Progress Notes (Signed)
Cardiology Office Note    Date:  10/20/2016   ID:  Marc Walker, DOB April 22, 1971, MRN [id number]  PCP:  Maren Reamer, MD  Cardiologist: Dr. Harrington Challenger  Chief Complaint: 3  Months follow up for CAD, HTN and HL  History of Present Illness:   Marc Walker is a 46 y.o. male with hx of CAD s/p multiple PCI, HTN, HLD, DM, OSA on CPAPand tobacco abuse presents for follow up.   LHC 08/2015 demonstrated 80% mid RCA stenosis treated with DES, 90% proximal LAD and 70% mid LAD stenosis involving the previous stent treated with DES, 20% RPDA in-stent restenosis treated with angioplasty. The stent in OM2 was patent. Residual disease included 90% D2 stenosis and 70% mid LCx stenosis, treated medically.   Myoview 02/2016 demonstrated inferolateral and apical ischemia of mild severity. Overall, study was low risk. Dr. Harrington Challenger recomended continue medical therapy.  Last echo 06/2015 showed normal LV function with grade 2 DD. Seen in lipid clinic 09/22/16 and continued Lipitor 74m and ezetimibe 155mqd.   Here today for follow up. Patient  has a baseline exertional shortness of breath. Did not require any nitroglycerin in 2 years. He is compliant with CPAP machine. He has a trace lower extremity edema on exam. He wakes up at night frequently to use bathroom. Seems no orthopnea and PND. Denies chest pain, palpitation, syncope, dizziness, melena or blood in the stool or urine.   Past Medical History:  Diagnosis Date  . Coronary artery disease   . Diabetes mellitus without complication ([facility]  . Heartburn   . High cholesterol   . Hyperlipidemia associated with type 2 diabetes mellitus ([facility]  . Hypertension   . MI ([place]ocardial infarction) ([facility]   [facility] [place], last one 02[place]2014   . Morbid obesity ([facility]/01[place]2017  . OSA (obstructive sleep apnea) 07/06/2016   Moderate with AHI 21.8/hr by PSG 08/2012 now on CPAP  .  Thyroid disease    past history    Past Surgical History:  Procedure Laterality Date  . APPENDECTOMY    . CARDIAC CATHETERIZATION N/A 09/16/2015   Procedure: Left Heart Cath and Coronary Angiography;  Surgeon: JoLorretta HarpMD;  [facility];  Service: Cardiovascular;  Laterality: N/A;  . CARDIAC CATHETERIZATION N/A 09/16/2015   Procedure: Coronary Stent Intervention;  Surgeon: JoLorretta HarpMD;  [facility];  Service: Cardiovascular;  Laterality: N/A;  . CORONARY STENT PLACEMENT      Current Medications: Prior to Admission medications   Medication Sig Start Date End Date Taking? Authorizing Provider  amLODipine (NORVASC) 5 MG tablet Take 1 tablet (5 mg total) by mouth daily. 09/01/16 11/30/16  DaMaren ReamerMD  aspirin 81 MG tablet Take 1 tablet (81 mg total) by mouth daily. 07/06/16   DaMaren ReamerMD  benzocaine (ORAJEL) 10 % mucosal gel Use as directed 1 application in the mouth or throat as needed for mouth pain. 10/01/16   MaAlfonse SpruceFNP  blood glucose meter kit and supplies KIT Dispense based on patient and insurance preference. Use up to four times daily as directed. (FOR ICD-9 250.00, 250.01). 09/17/15   Ripudeep K Krystal EatonMD  clopidogrel (PLAVIX) 75 MG tablet Take 1 tablet (75 mg total) by mouth daily. 09/01/16   DaMaren ReamerMD  ezetimibe (ZETIA) 10 MG tablet Take 1 tablet (  10 mg total) by mouth daily. 09/01/16 11/30/16  Maren Reamer, MD  famotidine (PEPCID) 20 MG tablet Take 1 tablet (20 mg total) by mouth 2 (two) times daily. 09/01/16   Maren Reamer, MD  furosemide (LASIX) 40 MG tablet Take 1 tablet (40 mg total) by mouth 3 (three) times a week. 05/25/16 08/23/16  Fay Records, MD  gabapentin (NEURONTIN) 300 MG capsule Take 3 capsules (900 mg total) by mouth 3 (three) times daily. 07/06/16   Maren Reamer, MD  glucose blood (TRUE METRIX BLOOD GLUCOSE TEST) test strip Use as instructed 09/01/16   Maren Reamer, MD    hydrochlorothiazide (HYDRODIURIL) 25 MG tablet Take 1 tablet (25 mg total) by mouth daily. 09/01/16   Maren Reamer, MD  ibuprofen (ADVIL,MOTRIN) 800 MG tablet Take 1 tablet (800 mg total) by mouth every 8 (eight) hours as needed for moderate pain (Take with food.). 10/01/16   Alfonse Spruce, FNP  insulin aspart (NOVOLOG FLEXPEN) 100 UNIT/ML FlexPen Inject 16-20 Units into the skin 3 (three) times daily with meals. 16 units for smaller meals, 20 units for normal meals. 09/23/16   Maren Reamer, MD  Insulin Glargine (LANTUS SOLOSTAR) 100 UNIT/ML Solostar Pen Inject 45 Units into the skin 2 (two) times daily. 09/23/16   Maren Reamer, MD  insulin lispro (HUMALOG KWIKPEN) 100 UNIT/ML KiwkPen Inject 16-20 units into the skin 3 (three) times daily with meals. 16 units for smaller meals, 20 units for normal meals 08/18/16   Maren Reamer, MD  Insulin Pen Needle 31G X 8 MM MISC Use as directed 08/06/16   Maren Reamer, MD  Insulin Syringes, Disposable, U-100 0.5 ML MISC Take insulin as directed   Diagnosis: Insulin dependent diabetes mellitus ICD10 250 11/04/15   Maren Reamer, MD  lisinopril (PRINIVIL,ZESTRIL) 10 MG tablet Take 1 tablet (10 mg total) by mouth 2 (two) times daily. 09/01/16   Maren Reamer, MD  metFORMIN (GLUCOPHAGE-XR) 500 MG 24 hr tablet TAKE 2 TABLETS BY MOUTH 2 TIMES DAILY WITH A MEAL. 09/01/16   Maren Reamer, MD  metoprolol (LOPRESSOR) 50 MG tablet Take 1 tablet (50 mg total) by mouth 2 (two) times daily. 09/01/16 11/30/16  Maren Reamer, MD  nicotine (NICOTROL) 10 MG inhaler Inhale 1 cartridge (1 continuous puffing total) into the lungs as needed for smoking cessation. 09/30/15   Imogene Burn, PA-C  nicotine polacrilex (NICORETTE) 4 MG gum Take 1 each (4 mg total) by mouth as needed for smoking cessation. 07/21/16   Maren Reamer, MD  nitroGLYCERIN (NITROSTAT) 0.4 MG SL tablet Place 1 tablet (0.4 mg total) under the tongue every 5 (five) minutes as needed for chest  pain. 09/17/15   Ripudeep Krystal Eaton, MD  oxybutynin (DITROPAN XL) 5 MG 24 hr tablet Take 1 tablet (5 mg total) by mouth at bedtime. 07/21/16   Maren Reamer, MD  potassium chloride (K-DUR) 10 MEQ tablet Take 1 tablet (10 mEq total) by mouth daily. 09/17/16 12/16/16  Tresa Garter, MD  rosuvastatin (CRESTOR) 40 MG tablet Take 1 tablet (40 mg total) by mouth daily. 09/01/16 11/30/16  Maren Reamer, MD  trimethoprim-polymyxin b (POLYTRIM) ophthalmic solution Place 1 drop into the left eye every 4 (four) hours. For 5 days 07/24/16   Noland Fordyce, PA-C  TRUEPLUS LANCETS 28G MISC Use as directed 09/01/16   Maren Reamer, MD    Allergies:   Other and Levofloxacin  Social History   Social History  . Marital status: Divorced    Spouse name: N/A  . Number of children: N/A  . Years of education: N/A   Occupational History  . Biochemist, clinical    Social History Main Topics  . Smoking status: Current Every Day Smoker    Packs/day: 1.00    Years: 33.00    Types: Cigarettes  . Smokeless tobacco: Never Used  . Alcohol use No  . Drug use: No  . Sexual activity: Not Asked   Other Topics Concern  . None   Social History Narrative   Adopted. Very little info on biological parents, ?diabetes.   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 He was adopted.   ROS:   Please see the history of present illness.    ROS All other systems reviewed and are negative.   PHYSICAL EXAM:   VS:  BP 102/60 (BP Location: Right Arm)   Pulse 73   Ht _0  (1.651 m)   Wt 291 lb (132 kg)   BMI 48.42 kg/m    GEN: Obese male in no acute distress  HEENT: normal  Neck: no JVD, carotid bruits, or masses Cardiac: RRR; no murmurs, rubs, or gallops, Trace edema  Respiratory:  clear to auscultation bilaterally, normal work of breathing GI: soft, nontender, nondistended, + BS MS: no deformity or atrophy  Skin: warm and dry,  no rash Neuro:  Alert and Oriented x 3, Strength and sensation are intact Psych: euthymic mood, full affect  Wt Readings from Last 3 Encounters:  10/20/16 291 lb (132 kg)  10/01/16 284 lb 12.8 oz (129.2 kg)  09/23/16 287 lb 9.6 oz (130.5 kg)      Studies/Labs Reviewed:   EKG:  EKG is not ordered today.   Recent Labs: 07/09/2016: B Natriuretic Peptide 71.4 07/11/2016: BUN 17; Creatinine, Ser 0.69; Hemoglobin 12.6; Platelets 202; Potassium 3.9; Sodium 135 08/21/2016: ALT 28   Lipid Panel    Component Value Date/Time   CHOL 104 09/18/2016 1028   TRIG 76 09/18/2016 1028   HDL 39 (L) 09/18/2016 1028   CHOLHDL 2.7 09/18/2016 1028   CHOLHDL 2.9 07/10/2016 0416   VLDL 18 07/10/2016 0416   LDLCALC 50 09/18/2016 1028    Additional studies/ records that were reviewed today include:   Echocardiogram: 06/2016 Study Conclusions  - Left ventricle: The cavity size was normal. Wall thickness was increased in a pattern of mild LVH. Systolic function was normal. The estimated ejection fraction was in the range of 60% to 65%. Features are consistent with a pseudonormal left ventricular filling pattern, with concomitant abnormal relaxation and increased filling pressure (grade 2 diastolic dysfunction). - Left atrium: The atrium was mildly dilated. - Impressions: Technically limited study due to poor sound wave transmission. No obvious wall motion abnormalities with Definity contrast imaging.  Impressions:  - Technically limited study due to poor sound wave transmission. No obvious wall motion abnormalities with Definity contrast imaging.   Myoview 02/2016 Study Highlights     Nuclear stress EF: 55%.  There was no ST segment deviation noted during stress.  Defect 1: There is a medium defect of mild severity present in the basal inferolateral, mid inferolateral and apical inferior location.  Findings consistent with ischemia.  This is a low risk  study.  Low risk stress nuclear study with mild ischemia in the distribution of a distal  OM or PLA branch. Normal left ventricular regional and global systolic function.      Cardiac Catheterization: 08/2015 Conclusion    Mid Cx lesion, 70% stenosed.  Prox LAD to Mid LAD lesion, 90% stenosed.  2nd Diag lesion, 90% stenosed.  Mid RCA to Dist RCA lesion, 20% stenosed. The lesion was previously treated with a stent (unknown type).  Ost RPDA to RPDA lesion, 20% stenosed. Post intervention, there is a 20% residual stenosis. The lesion was previously treated with a stent (unknown type).  Mid RCA lesion, 80% stenosed. Post intervention, there is a 0% residual stenosis.  Mid LAD to Dist LAD lesion, 70% stenosed. Post intervention, there is a 0% residual stenosis. The lesion was previously treated with a stent (unknown type).   IMPRESSION:successful proximal LAD PCI and drug-eluting stenting as well as mid RCA along with Angiosculpt balloon angioplasty of the proximal segmental PDA "in-stent restenosis. The patient tolerated the procedure well. He left the lab in stable condition. The sheaths were removed after the Angiomax has been discontinued and pressure will be held. He'll be hydrated overnight and discharged home in the morning on antiplatelet therapy. Cardiac risk factor modification including smoking cessation should be encouraged.   ASSESSMENT & PLAN:    1. CAD s/p multiple PCI - Last myoview 02/2016 was low risk stress nuclear study with mild ischemia in the distribution of a distal OM or PLA branch. Dr. Harrington Challenger recommended medical therapy. Continue aspirin, Plavix, statin.   2. HTN - Controlled on current regimen.  3. HLD - 07/10/2016: VLDL 18 09/18/2016: Cholesterol, Total 104; HDL 39; LDL Calculated 50; Triglycerides 76  - Continue current therapy  4. OSA on CPAP - compliant  5. Chronic diastolic CHF - He has a trace lower extremity edema on exam. Advised to  use compression stocking. - Continue Lasix and HCTZ.  We will check be BMET today.  6. Dyspnea on exertion - This been stable for many months. Relieved with rest. He did not require any sublingual nitroglycerin. He multifactorial due to morbid obesity and diastolic dysfunction. Advised to lose weight. Regular exercise. Cut back on salt. He will let us know if worsening of symptoms.    Medication Adjustments/Labs and Tests Ordered: Current medicines are reviewed at length with the patient today.  Concerns regarding medicines are outlined above.  Medication changes, Labs and Tests ordered today are listed in the Patient Instructions below. Patient Instructions  Medication Instructions:  Your physician recommends that you continue on your current medications as directed. Please refer to the Current Medication list given to you today.   Labwork: TODAY BMET  Testing/Procedures: NONE ORDERED  Follow-Up: DR. Harrington Challenger 3-4 MONTHS  Any Other Special Instructions Will Be Listed Below (If Applicable). PER THE PA TODAY TO FOLLOW UP WITH PRIMARY CARE IN REGARDS TO YOUR HANDICAP PLACARD NEEDING TO RENEWED    If you need a refill on your cardiac medications before your next appointment, please call your pharmacy.      Jarrett Soho, [place]  10/20/2016 9:33 AM    [facility] [place], [place], [place]  [address] Phone: [phone]; Fax: [phone]

## 2016-10-20 ENCOUNTER — Encounter: Payer: Self-pay | Admitting: Physician Assistant

## 2016-10-20 ENCOUNTER — Ambulatory Visit (INDEPENDENT_AMBULATORY_CARE_PROVIDER_SITE_OTHER): Payer: Self-pay | Admitting: Physician Assistant

## 2016-10-20 VITALS — BP 102/60 | HR 73 | Ht 65.0 in | Wt 291.0 lb

## 2016-10-20 DIAGNOSIS — G4733 Obstructive sleep apnea (adult) (pediatric): Secondary | ICD-10-CM

## 2016-10-20 DIAGNOSIS — E785 Hyperlipidemia, unspecified: Secondary | ICD-10-CM

## 2016-10-20 DIAGNOSIS — I5189 Other ill-defined heart diseases: Secondary | ICD-10-CM

## 2016-10-20 DIAGNOSIS — Z9989 Dependence on other enabling machines and devices: Secondary | ICD-10-CM

## 2016-10-20 DIAGNOSIS — I5032 Chronic diastolic (congestive) heart failure: Secondary | ICD-10-CM

## 2016-10-20 DIAGNOSIS — I2510 Atherosclerotic heart disease of native coronary artery without angina pectoris: Secondary | ICD-10-CM

## 2016-10-20 DIAGNOSIS — Z9861 Coronary angioplasty status: Secondary | ICD-10-CM

## 2016-10-20 DIAGNOSIS — I519 Heart disease, unspecified: Secondary | ICD-10-CM

## 2016-10-20 LAB — BASIC METABOLIC PANEL
BUN/Creatinine Ratio: 26 — ABNORMAL HIGH (ref 9–20)
BUN: 18 mg/dL (ref 6–24)
CO2: 23 mmol/L (ref 18–29)
CREATININE: 0.7 mg/dL — AB (ref 0.76–1.27)
Calcium: 9.4 mg/dL (ref 8.7–10.2)
Chloride: 95 mmol/L — ABNORMAL LOW (ref 96–106)
GFR, EST AFRICAN AMERICAN: 130 mL/min/{1.73_m2} (ref >59)
GFR, EST NON AFRICAN AMERICAN: 112 mL/min/{1.73_m2} (ref >59)
Glucose: 151 mg/dL — ABNORMAL HIGH (ref 65–99)
Potassium: 4.2 mmol/L (ref 3.5–5.2)
SODIUM: 137 mmol/L (ref 134–144)

## 2016-10-20 NOTE — Patient Instructions (Addendum)
Medication Instructions:  Your physician recommends that you continue on your current medications as directed. Please refer to the Current Medication list given to you today.   Labwork: TODAY BMET  Testing/Procedures: NONE ORDERED  Follow-Up: DR. Tenny Craw 3-4 MONTHS  Any Other Special Instructions Will Be Listed Below (If Applicable). PER THE PA TODAY TO FOLLOW UP WITH PRIMARY CARE IN REGARDS TO YOUR HANDICAP PLACARD NEEDING TO RENEWED    If you need a refill on your cardiac medications before your next appointment, please call your pharmacy.

## 2016-10-22 ENCOUNTER — Telehealth: Payer: Self-pay | Admitting: Internal Medicine

## 2016-10-22 NOTE — Telephone Encounter (Signed)
New message     Pt is returning heather call for test results

## 2016-10-22 NOTE — Telephone Encounter (Signed)
Follow Up:   Please call, please leave a detailed message on his voicemail if pt is not available. Please make a note to change pt's pharmacy to [facility] and [facility].

## 2016-10-22 NOTE — Telephone Encounter (Signed)
Notes recorded by Lendon Ka, RN on 10/22/2016 at 2:53 PM EDT Left detailed message that kidney function and electrolytes are stable. Call back if any concerns.

## 2016-10-27 ENCOUNTER — Other Ambulatory Visit: Payer: Self-pay | Admitting: Family Medicine

## 2016-10-27 ENCOUNTER — Ambulatory Visit: Payer: Self-pay | Attending: Internal Medicine | Admitting: Pharmacist

## 2016-10-27 VITALS — BP 126/88 | HR 61

## 2016-10-27 DIAGNOSIS — E1165 Type 2 diabetes mellitus with hyperglycemia: Secondary | ICD-10-CM | POA: Insufficient documentation

## 2016-10-27 DIAGNOSIS — E1142 Type 2 diabetes mellitus with diabetic polyneuropathy: Secondary | ICD-10-CM

## 2016-10-27 DIAGNOSIS — Z7982 Long term (current) use of aspirin: Secondary | ICD-10-CM | POA: Insufficient documentation

## 2016-10-27 DIAGNOSIS — I10 Essential (primary) hypertension: Secondary | ICD-10-CM | POA: Insufficient documentation

## 2016-10-27 DIAGNOSIS — Z794 Long term (current) use of insulin: Secondary | ICD-10-CM | POA: Insufficient documentation

## 2016-10-27 DIAGNOSIS — K047 Periapical abscess without sinus: Secondary | ICD-10-CM

## 2016-10-27 DIAGNOSIS — K029 Dental caries, unspecified: Secondary | ICD-10-CM

## 2016-10-27 MED FILL — CLOPIDOGREL 75 MG TABLET: 75 | 30 days supply | Qty: 30 | Fill #2

## 2016-10-27 MED FILL — ROSUVASTATIN CALCIUM 40 MG: 40 | 30 days supply | Qty: 30 | Fill #2

## 2016-10-27 MED FILL — ?FUROSEMIDE 40 MG TABLET: 40 | 28 days supply | Qty: 12 | Fill #2

## 2016-10-27 MED FILL — TRUEPLUS SYR 0.5ML 31GX5/16: 31G X 5/16" | 30 days supply | Qty: 100 | Fill #6

## 2016-10-27 MED FILL — TRUEPLUS PEN NDL 31GX3/16": 31G X 5 MM | 30 days supply | Qty: 100 | Fill #2

## 2016-10-27 MED FILL — !NOVOLOG FLEXPEN SYRINGE 1: 100/ML | 12 days supply | Qty: 6 | Fill #3

## 2016-10-27 MED FILL — TRUEplus LANCETS 28G MISC: 25 days supply | Qty: 100 | Fill #2

## 2016-10-27 MED FILL — HYDROCHLOROTHIAZIDE 25 MG T: 25 | 30 days supply | Qty: 30 | Fill #2

## 2016-10-27 MED FILL — POTASSIUM CL ER 10 MEQ CAP: 10 | 15 days supply | Qty: 15 | Fill #3

## 2016-10-27 MED FILL — METOPROLOL TARTRATE 50 MG T: 50 | 30 days supply | Qty: 60 | Fill #2

## 2016-10-27 MED FILL — ?OXYBUTYNIN CL ER 5MG TABLE: 5 | 30 days supply | Qty: 30 | Fill #3

## 2016-10-27 MED FILL — TRUEPLUS PEN NDL 31GX3/16: 31G X 5 MM | 30 days supply | Qty: 100 | Fill #2

## 2016-10-27 MED FILL — ?EZETIMIBE 10 MG TAB: 10 | 30 days supply | Qty: 30 | Fill #2

## 2016-10-27 MED FILL — AMLODIPINE BESYLATE 5 MG TA: 5 | 30 days supply | Qty: 30 | Fill #4

## 2016-10-27 MED FILL — FAMOTIDINE 20 MG TABLET: 20 | 30 days supply | Qty: 60 | Fill #1

## 2016-10-27 MED FILL — GABAPENTIN 300 MG CAPSULE: 300 | 20 days supply | Qty: 180 | Fill #2

## 2016-10-27 MED FILL — ?LISINOPRIL 10 MG TABLET: 10 | 30 days supply | Qty: 60 | Fill #2

## 2016-10-27 MED FILL — $LANTUS 100 UNITS/ML VIAL: 100 | 21 days supply | Qty: 20 | Fill #1

## 2016-10-27 MED FILL — METFORMIN HCL ER 500 MG TAB: 500 | 30 days supply | Qty: 120 | Fill #2

## 2016-10-27 NOTE — Patient Instructions (Signed)
Thanks for coming to see me!  Continue current regimen. Try to eat at least something small at lunch so you don't get too low.  See Dr. Julien Nordmann in 2-3 weeks for diabetes follow up

## 2016-10-27 NOTE — Progress Notes (Signed)
    S:     No chief complaint on file.   Patient arrives in good spirits.  Presents for diabetes evaluation, education, and management at the request of Dr. Julien Nordmann. Patient was referred on 09/23/16.  Patient was last seen by Primary Care Provider on 09/23/16.    Patient reports adherence with medications.  Current diabetes medications include: Lantus 45 units BID, and Humalog 16-20 units with meals, metformin XL 500 mg 2 tablets twice daily.  Current hypertension medications include: amlodipine 5 mg daily, lisinopril 10 mg daily, hydrochlorothiazide 25 mg daily, and metoprolol tartrate 50 mg BID.   Patient denies hypoglycemic events.  Patient reported dietary habits: will likely start eating 2 meals daily. He was promoted and is training for a new job and he doesn't have a much time for lunch.  Patient reported exercise habits: walking a lot at work   Patient denies nocturia.  Patient denies neuropathy. Patient denies visual changes. Patient reports self foot exams.   He checks his blood pressures at home and pulses and all have been WNL.   O:  Physical Exam   ROS   Lab Results  Component Value Date   HGBA1C 7.8 (H) 07/10/2016   There were no vitals filed for this visit.  Home fasting CBG: 97-180s  2 hour post-prandial/random CBG: <200s  A/P: Diabetes longstanding currently UNcontrolled based on A1c of 7.8. Patient denies hypoglycemic events and is able to verbalize appropriate hypoglycemia management plan. Patient reports adherence with medication. Control is suboptimal due to dietary indiscretion.  Will not make any changes to his insulin regimen. Though he could benefit from a dose increase based on current numbers, if he is going to be unable to eat lunch at work, I am worried he will become hypoglycemic with a dose increase. Patient will continue to monitor over the next 2 weeks (the length of his training). Encouraged patient to have a snack with him if he isn't  able to eat to prevent hypoglycemia.   Next A1C anticipated at next visit - unable to obtain today due to staffing in the lab.   ASCVD risk greater than 7.5%. Continued Aspirin 81 mg and Continued rosuvastatin 40 mg.   Hypertension longstanding/newly diagnosed currently controlled.  Patient reports adherence with medication. Reviewed pulse goals. No changes at this time.   Written patient instructions provided.  Total time in face to face counseling 20 minutes.   Follow up in Pharmacist Clinic Visit PRN, next visit with Dr. Julien Nordmann per patient preference (she is leaving and he would like to see her one last time)

## 2016-10-28 MED FILL — IBUPROFEN 800 MG TABLET: 800 | 40 days supply | Qty: 40 | Fill #0

## 2016-11-02 ENCOUNTER — Telehealth: Payer: Self-pay | Admitting: *Deleted

## 2016-11-02 DIAGNOSIS — G4733 Obstructive sleep apnea (adult) (pediatric): Secondary | ICD-10-CM

## 2016-11-02 NOTE — Telephone Encounter (Signed)
-----   Message from Reesa Cheworothea G Emmily Pellegrin, CMA sent at 09/30/2016  5:30 PM EDT ----- Regarding: 09/03/16 original date  I gave this patient a new Rx for a mask today.   Please put it on the calendar to call him in 2 months to bring in his SD card to get a download.

## 2016-11-02 NOTE — Telephone Encounter (Addendum)
Called the patient to get a 2 month download on his SD card and patient states he will come on Tuesday 11/10/16 to bring his card.

## 2016-11-12 ENCOUNTER — Encounter: Payer: Self-pay | Admitting: Internal Medicine

## 2016-11-13 ENCOUNTER — Encounter: Payer: Self-pay | Admitting: Internal Medicine

## 2016-11-16 ENCOUNTER — Encounter: Payer: Self-pay | Admitting: Internal Medicine

## 2016-11-17 ENCOUNTER — Encounter: Payer: Self-pay | Admitting: Internal Medicine

## 2016-11-17 ENCOUNTER — Ambulatory Visit: Payer: Self-pay | Attending: Internal Medicine | Admitting: Internal Medicine

## 2016-11-17 VITALS — BP 115/82 | HR 70 | Temp 98.7°F | Resp 16 | Wt 289.6 lb

## 2016-11-17 DIAGNOSIS — I2510 Atherosclerotic heart disease of native coronary artery without angina pectoris: Secondary | ICD-10-CM | POA: Insufficient documentation

## 2016-11-17 DIAGNOSIS — Z720 Tobacco use: Secondary | ICD-10-CM | POA: Insufficient documentation

## 2016-11-17 DIAGNOSIS — I25119 Atherosclerotic heart disease of native coronary artery with unspecified angina pectoris: Secondary | ICD-10-CM

## 2016-11-17 DIAGNOSIS — G4733 Obstructive sleep apnea (adult) (pediatric): Secondary | ICD-10-CM | POA: Insufficient documentation

## 2016-11-17 DIAGNOSIS — E1142 Type 2 diabetes mellitus with diabetic polyneuropathy: Secondary | ICD-10-CM | POA: Insufficient documentation

## 2016-11-17 DIAGNOSIS — N400 Enlarged prostate without lower urinary tract symptoms: Secondary | ICD-10-CM | POA: Insufficient documentation

## 2016-11-17 DIAGNOSIS — I10 Essential (primary) hypertension: Secondary | ICD-10-CM | POA: Insufficient documentation

## 2016-11-17 DIAGNOSIS — E785 Hyperlipidemia, unspecified: Secondary | ICD-10-CM | POA: Insufficient documentation

## 2016-11-17 DIAGNOSIS — Z79899 Other long term (current) drug therapy: Secondary | ICD-10-CM | POA: Insufficient documentation

## 2016-11-17 DIAGNOSIS — Z9861 Coronary angioplasty status: Secondary | ICD-10-CM

## 2016-11-17 DIAGNOSIS — N401 Enlarged prostate with lower urinary tract symptoms: Secondary | ICD-10-CM | POA: Insufficient documentation

## 2016-11-17 DIAGNOSIS — Z7902 Long term (current) use of antithrombotics/antiplatelets: Secondary | ICD-10-CM | POA: Insufficient documentation

## 2016-11-17 DIAGNOSIS — R350 Frequency of micturition: Secondary | ICD-10-CM

## 2016-11-17 DIAGNOSIS — Z794 Long term (current) use of insulin: Secondary | ICD-10-CM | POA: Insufficient documentation

## 2016-11-17 DIAGNOSIS — I252 Old myocardial infarction: Secondary | ICD-10-CM | POA: Insufficient documentation

## 2016-11-17 DIAGNOSIS — E079 Disorder of thyroid, unspecified: Secondary | ICD-10-CM | POA: Insufficient documentation

## 2016-11-17 DIAGNOSIS — Z7982 Long term (current) use of aspirin: Secondary | ICD-10-CM | POA: Insufficient documentation

## 2016-11-17 LAB — POCT GLYCOSYLATED HEMOGLOBIN (HGB A1C): Hemoglobin A1C: 7.4

## 2016-11-17 LAB — GLUCOSE, POCT (MANUAL RESULT ENTRY): POC Glucose: 168 mg/dl — AB (ref 70–99)

## 2016-11-17 MED ORDER — LISINOPRIL 10 MG PO TABS: 10.0000 mg | ORAL_TABLET | Freq: Two times a day (BID) | ORAL | 3 refills | Status: DC

## 2016-11-17 MED ORDER — ASPIRIN 81 MG PO TABS: 81.0000 mg | ORAL_TABLET | Freq: Every day | ORAL | 3 refills | Status: DC

## 2016-11-17 MED ORDER — HYDROCHLOROTHIAZIDE 25 MG PO TABS: 25.0000 mg | ORAL_TABLET | Freq: Every day | ORAL | 3 refills | Status: DC

## 2016-11-17 MED ORDER — FLUTICASONE PROPIONATE 50 MCG/ACT NA SUSP: 2.0000 | Freq: Every day | NASAL | 6 refills | Status: DC

## 2016-11-17 MED ORDER — EMPAGLIFLOZIN 10 MG PO TABS: 10.0000 mg | ORAL_TABLET | Freq: Every day | ORAL | 3 refills | Status: DC

## 2016-11-17 MED ORDER — EZETIMIBE 10 MG PO TABS: 10.0000 mg | ORAL_TABLET | Freq: Every day | ORAL | 2 refills | Status: DC

## 2016-11-17 MED ORDER — METFORMIN HCL ER 500 MG PO TB24: ORAL_TABLET | ORAL | 2 refills | Status: DC

## 2016-11-17 MED ORDER — TAMSULOSIN HCL 0.4 MG PO CAPS: 0.4000 mg | ORAL_CAPSULE | Freq: Every day | ORAL | 3 refills | Status: DC

## 2016-11-17 MED ORDER — BUPROPION HCL ER (SR) 150 MG PO TB12: 150.0000 mg | ORAL_TABLET | Freq: Two times a day (BID) | ORAL | 2 refills | Status: DC

## 2016-11-17 MED ORDER — METOPROLOL TARTRATE 50 MG PO TABS: 50.0000 mg | ORAL_TABLET | Freq: Two times a day (BID) | ORAL | 11 refills | Status: DC

## 2016-11-17 MED ORDER — AMLODIPINE BESYLATE 5 MG PO TABS: 5.0000 mg | ORAL_TABLET | Freq: Every day | ORAL | 6 refills | Status: DC

## 2016-11-17 MED ORDER — GLUCOSE BLOOD VI STRP: ORAL_STRIP | 12 refills | Status: DC

## 2016-11-17 MED ORDER — TRUEPLUS LANCETS 28G MISC: 12 refills | Status: DC

## 2016-11-17 MED ORDER — CLOPIDOGREL BISULFATE 75 MG PO TABS: 75.0000 mg | ORAL_TABLET | Freq: Every day | ORAL | 10 refills | Status: DC

## 2016-11-17 MED ORDER — ASPIRIN 81 MG PO TABS: 81.0000 mg | ORAL_TABLET | Freq: Every day | ORAL | 3 refills | Status: AC

## 2016-11-17 MED ORDER — ROSUVASTATIN CALCIUM 40 MG PO TABS: 40.0000 mg | ORAL_TABLET | Freq: Every day | ORAL | 3 refills | Status: DC

## 2016-11-17 MED ORDER — LORATADINE 10 MG PO TABS: 10.0000 mg | ORAL_TABLET | Freq: Every day | ORAL | 11 refills | Status: DC

## 2016-11-17 MED ORDER — EMPAGLIFLOZIN 10 MG PO TABS
10.0000 mg | ORAL_TABLET | Freq: Every day | ORAL | 3 refills | Status: DC
Start: 2016-11-17 — End: 2016-12-28

## 2016-11-17 MED FILL — TAMSULOSIN HCL 0.4 MG CAP: 0.4 | 30 days supply | Qty: 30 | Fill #0

## 2016-11-17 MED FILL — TRUEplus LANCETS 28G MISC: 30 days supply | Qty: 100 | Fill #0

## 2016-11-17 MED FILL — CLOPIDOGREL 75 MG TABLET: 75 | 30 days supply | Qty: 30 | Fill #0

## 2016-11-17 MED FILL — METFORMIN HCL ER 500 MG TAB: 500 | 30 days supply | Qty: 120 | Fill #0

## 2016-11-17 MED FILL — ROSUVASTATIN CALCIUM 40 MG: 40 | 30 days supply | Qty: 30 | Fill #0

## 2016-11-17 MED FILL — FLUTICASONE PROP 50 MCG SPR: 50 | 30 days supply | Qty: 16 | Fill #0

## 2016-11-17 MED FILL — TRUE METRIX TEST STRIP: 25 days supply | Qty: 100 | Fill #1

## 2016-11-17 MED FILL — HYDROCHLOROTHIAZIDE 25 MG T: 25 | 30 days supply | Qty: 30 | Fill #0

## 2016-11-17 MED FILL — BUPROPION SR 150 MG TABLET: 150 | 30 days supply | Qty: 60 | Fill #0

## 2016-11-17 MED FILL — METOPROLOL TARTRATE 50 MG T: 50 | 30 days supply | Qty: 60 | Fill #0

## 2016-11-17 MED FILL — LISINOPRIL 10 MG TABLET: 10 | 30 days supply | Qty: 60 | Fill #0

## 2016-11-17 MED FILL — !NOVOLOG FLEXPEN SYRINGE 1: 100/ML | 12 days supply | Qty: 6 | Fill #4

## 2016-11-17 NOTE — Progress Notes (Signed)
Marc Walker, is a 46 y.o. male  RAX:[id number]  UPJ:[id number]  DOB - 1971-03-28  Chief Complaint  Patient presents with  . Diabetes        Subjective:   [name] is a 46 y.o. male here today for a follow up visit, last seen by me 09/23/16, w/ hx  htn, cad sp multiple PCI, and dm, morbid obese, continued tob abuse, HLD, osa compliant on home cpap.  Overall doing much better w/ his cbgs and insulin, trying to watch his diet better, and is more motivated to take care of himself.  Of note, he uses his cpap nightly, but does have some c/o of daytime somnolence still. Works at call center, so at desk all day, does not move much at home  Has been c/o of runny eyes. Denies nasal congestion/cough/pruritits.  C/o of urinary dribbling/hesitancy at times.  Patient has No headache, No chest pain, No abdominal pain - No Nausea, No new weakness tingling or numbness, No Cough - SOB.  No problems updated.  ALLERGIES: Allergies  Allergen Reactions  . 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  Pt only wants pain medicine if absolutely necessary.  . Levofloxacin Rash    Rash on back (not sun exposed)and hypersensitivity to skin on exposed skin/arm    PAST MEDICAL HISTORY: Past Medical History:  Diagnosis Date  . Coronary artery disease   . Diabetes mellitus without complication ([place])   . Heartburn   . High cholesterol   . Hyperlipidemia associated with type 2 diabetes mellitus ([place])   . Hypertension   . MI (myocardial infarction) ([place]) 07/2009   [facility] in [place], [place] for last 2, 1st one in [place], [place]; total of 6 stents, last one 02/2014   . Morbid obesity ([place]) 07/06/2016  . OSA (obstructive sleep apnea) 07/06/2016   Moderate with AHI 21.8/hr by PSG 08/2012 now on CPAP  . Thyroid disease    past history    MEDICATIONS AT HOME: Prior to Admission medications   Medication Sig Start Date End Date Taking? Authorizing Provider  amLODipine  (NORVASC) 5 MG tablet Take 1 tablet (5 mg total) by mouth daily. 11/17/16 02/15/17  Maren Reamer, MD  aspirin 81 MG tablet Take 1 tablet (81 mg total) by mouth daily. 11/17/16   Maren Reamer, MD  benzocaine (ORAJEL) 10 % mucosal gel Use as directed 1 application in the mouth or throat as needed for mouth pain. 10/01/16   Alfonse Spruce, FNP  blood glucose meter kit and supplies KIT Dispense based on patient and insurance preference. Use up to four times daily as directed. (FOR ICD-9 250.00, 250.01). 09/17/15   Rai, Ripudeep K, MD  buPROPion (WELLBUTRIN SR) 150 MG 12 hr tablet Take 1 tablet (150 mg total) by mouth 2 (two) times daily. 11/17/16   Maren Reamer, MD  clopidogrel (PLAVIX) 75 MG tablet Take 1 tablet (75 mg total) by mouth daily. 11/17/16   Habib Kise, Leda Quail, MD  empagliflozin (JARDIANCE) 10 MG TABS tablet Take 10 mg by mouth daily. 11/17/16   Maren Reamer, MD  ezetimibe (ZETIA) 10 MG tablet Take 1 tablet (10 mg total) by mouth daily. 11/17/16 02/15/17  Maren Reamer, MD  famotidine (PEPCID) 20 MG tablet Take 1 tablet (20 mg total) by mouth 2 (two) times daily. 09/01/16   Maren Reamer, MD  fluticasone (FLONASE) 50 MCG/ACT nasal spray Place 2  sprays into both nostrils daily. 11/17/16   Maren Reamer, MD  furosemide (LASIX) 40 MG tablet Take 1 tablet (40 mg total) by mouth 3 (three) times a week. 05/25/16 08/23/16  Fay Records, MD  gabapentin (NEURONTIN) 300 MG capsule Take 3 capsules (900 mg total) by mouth 3 (three) times daily. 07/06/16   Lottie Mussel T, MD  glucose blood (TRUE METRIX BLOOD GLUCOSE TEST) test strip Use as instructed 11/17/16   Lottie Mussel T, MD  hydrochlorothiazide (HYDRODIURIL) 25 MG tablet Take 1 tablet (25 mg total) by mouth daily. 11/17/16   Maren Reamer, MD  ibuprofen (ADVIL,MOTRIN) 800 MG tablet TAKE 1 TABLET BY MOUTH EVERY 8 HOURS AS NEEDED FOR MODERATE PAIN (TAKE WITH FOOD). 10/28/16   Mariacristina Aday T, MD  insulin aspart (NOVOLOG  FLEXPEN) 100 UNIT/ML FlexPen Inject 16-20 Units into the skin 3 (three) times daily with meals. 16 units for smaller meals, 20 units for normal meals. 09/23/16   Maren Reamer, MD  Insulin Glargine (LANTUS SOLOSTAR) 100 UNIT/ML Solostar Pen Inject 45 Units into the skin 2 (two) times daily. 09/23/16   Maren Reamer, MD  insulin lispro (HUMALOG KWIKPEN) 100 UNIT/ML KiwkPen Inject 16-20 units into the skin 3 (three) times daily with meals. 16 units for smaller meals, 20 units for normal meals 08/18/16   Lottie Mussel T, MD  Insulin Pen Needle 31G X 8 MM MISC Use as directed 08/06/16   Lottie Mussel T, MD  Insulin Syringes, Disposable, U-100 0.5 ML MISC Take insulin as directed   Diagnosis: Insulin dependent diabetes mellitus ICD10 250 11/04/15   Jahayra Mazo T, MD  lisinopril (PRINIVIL,ZESTRIL) 10 MG tablet Take 1 tablet (10 mg total) by mouth 2 (two) times daily. 11/17/16   Maren Reamer, MD  loratadine (CLARITIN) 10 MG tablet Take 1 tablet (10 mg total) by mouth daily. 11/17/16   Marianela Mandrell, Leda Quail, MD  metFORMIN (GLUCOPHAGE-XR) 500 MG 24 hr tablet TAKE 2 TABLETS BY MOUTH 2 TIMES DAILY WITH A MEAL. 11/17/16   Dorna Mallet T, MD  metoprolol tartrate (LOPRESSOR) 50 MG tablet Take 1 tablet (50 mg total) by mouth 2 (two) times daily. 11/17/16 02/15/17  Maren Reamer, MD  nicotine (NICOTROL) 10 MG inhaler Inhale 1 cartridge (1 continuous puffing total) into the lungs as needed for smoking cessation. 09/30/15   Imogene Burn, PA-C  nicotine polacrilex (NICORETTE) 4 MG gum Take 1 each (4 mg total) by mouth as needed for smoking cessation. 07/21/16   Maren Reamer, MD  nitroGLYCERIN (NITROSTAT) 0.4 MG SL tablet Place 1 tablet (0.4 mg total) under the tongue every 5 (five) minutes as needed for chest pain. 09/17/15   Rai, Ripudeep K, MD  potassium chloride (K-DUR) 10 MEQ tablet Take 1 tablet (10 mEq total) by mouth daily. 09/17/16 12/16/16  Tresa Garter, MD  rosuvastatin (CRESTOR) 40 MG  tablet Take 1 tablet (40 mg total) by mouth daily. 11/17/16 02/15/17  Maren Reamer, MD  tamsulosin (FLOMAX) 0.4 MG CAPS capsule Take 1 capsule (0.4 mg total) by mouth daily. 11/17/16   Maren Reamer, MD  TRUEPLUS LANCETS 28G MISC Use as directed 11/17/16   Maren Reamer, MD     Objective:   Vitals:   11/17/16 1137  BP: 115/82  Pulse: 70  Resp: 16  Temp: 98.7 F (37.1 C)  TempSrc: Oral  SpO2: 96%  Weight: 289 lb 9.6 oz (131.4 kg)    Exam General appearance :  Awake, alert, not in any distress. Speech Clear. Not toxic looking, obese, pleasant. Sounds nasal congested today HEENT: Atraumatic and Normocephalic, pupils equally reactive to light. bilat TMs clear. Clear conjuctival bilat.  Nasal passages slightly boggy/pale. No facial/max sinus tenderness bilat. Neck: supple, no JVD. Chest:Good air entry bilaterally, no added sounds. CVS: S1 S2 regular, no murmurs/gallups or rubs. Abdomen: Bowel sounds active, Non tender and not distended with no gaurding, rigidity or rebound. Extremities: B/L Lower Ext shows no edema, both legs are warm to touch Neurology: Awake alert, and oriented X 3, CN II-XII grossly intact, Non focal Skin:No Rash  Data Review Lab Results  Component Value Date   HGBA1C 7.4 11/17/2016   HGBA1C 7.8 (H) 07/10/2016   HGBA1C 7.7 07/06/2016    Depression screen PHQ 2/9 11/17/2016 10/01/2016 09/23/2016 09/01/2016 07/21/2016  Decreased Interest 0 0 0 0 0  Down, Depressed, Hopeless 0 0 0 0 0  PHQ - 2 Score 0 0 0 0 0  Altered sleeping 3 2 - - -  Tired, decreased energy 3 2 - - -  Change in appetite 1 2 - - -  Feeling bad or failure about yourself  0 0 - - -  Trouble concentrating 2 0 - - -  Moving slowly or fidgety/restless 1 0 - - -  Suicidal thoughts 0 0 - - -  PHQ-9 Score 10 6 - - -  Difficult doing work/chores - - - - -      Assessment & Plan   1. DM type 2 with diabetic peripheral neuropathy (HCC) - Overall doing much better, continue same  metformin, lantus and novolog  - add jardiance 37m qd. (hx of cad, morbid obesity) - POCT glucose (manual entry) - POCT glycosylated hemoglobin (Hb A1C) 7.4 (from 7.8) - [facility] D[facility] clinic check  - 167month2. CAD S/P multiple PCI's On crestor, plavix, asa 81 Added Jardiance 10 qd today. - - rosuvastatin (CRESTOR) 40 MG tablet; Take 1 tablet (40 mg total) by mouth daily.  Dispense: 90 tablet; Refill: 3  3. Essential hypertension Well controlled, same regimen, low salt diet encouraged - lisinopril (PRINIVIL,ZESTRIL) 10 MG tablet; Take 1 tablet (10 mg total) by mouth 2 (two) times daily.  Dispense: 60 tablet; Refill: 3  4. OSA (obstructive sleep apnea) Encouraged nightime use of cpap. - c/o of daytime somnolence, recd pt moves more, hoping Jardiance and welbutrin may help some w/ weightloss as well. - consider adding Nuvigil, similar rx later, holding off since numerous med changes/additions today.  5. Tobacco abuse Total cessation recd.  For your tobacco abuse: Strongly recommend that he stop smoking back and all other inhaled products right away Call 1-800-Quit-Now to get free nicotine replacement from the state of [place]  - trial welbutrin 150bid, may  Help w/ weightloss as well.  6. Morbid obesity ([place] to increase activity as tolerated. Added jardiance and welbutrin today, hopefully will help more w/ weighloss  7. Hyperlipidemia, unspecified hyperlipidemia type - rosuvastatin (CRESTOR) 40 MG tablet; Take 1 tablet (40 mg total) by mouth daily.  Dispense: 90 tablet; Refill: 3   8. bph + sx psa recently neg - start flomax 0.16m18md trial   Patient have been counseled extensively about nutrition and exercise  Return in about 3 months (around 02/17/2017), or if symptoms worsen or fail to improve.  The patient was given clear instructions to go to ER or return to medical center if symptoms don't improve, worsen or new problems  develop. The patient verbalized  understanding. The patient was told to call to get lab results if they haven't heard anything in the next week.   This note has been created with Surveyor, quantity. Any transcriptional errors are unintentional.   Maren Reamer, MD, [facility], [place]   11/17/2016, 11:57 AM

## 2016-11-17 NOTE — Patient Instructions (Addendum)
1 month f/u [facility] dm check   Empagliflozin oral tablets What is this medicine? EMPAGLIFLOZIN (EM pa gli FLOE zin) helps to treat type 2 diabetes. It helps to control blood sugar. This drug may also reduce the risk of heart attack or stroke if you have type 2 diabetes and risk factors for heart disease. Treatment is combined with diet and exercise. This medicine may be used for other purposes; ask your health care provider or pharmacist if you have questions. COMMON BRAND NAME(S): JARDIANCE What should I tell my health care provider before I take this medicine? They need to know if you have any of these conditions: -dehydration -diabetic ketoacidosis -diet low in salt -eating less due to illness, surgery, dieting, or any other reason -having surgery -high cholesterol -high levels of potassium in the blood -history of pancreatitis or pancreas problems -history of yeast infection of the penis or vagina -if you often drink alcohol -infections in the bladder, kidneys, or urinary tract -kidney disease -liver disease -low blood pressure -on hemodialysis -problems urinating -type 1 diabetes -uncircumcised male -an unusual or allergic reaction to empagliflozin, other medicines, foods, dyes, or preservatives -pregnant or trying to get pregnant -breast-feeding How should I use this medicine? Take this medicine by mouth with a glass of water. Follow the directions on the prescription label. Take it in the morning, with or without food. Take your dose at the same time each day. Do not take more often than directed. Do not stop taking except on your doctor's advice. Talk to your pediatrician regarding the use of this medicine in children. Special care may be needed. Overdosage: If you think you have taken too much of this medicine contact a poison control center or emergency room at once. NOTE: This medicine is only for you. Do not share this medicine with others. What if I miss  a dose? If you miss a dose, take it as soon as you can. If it is almost time for your next dose, take only that dose. Do not take double or extra doses. What may interact with this medicine? Do not take this medicine with any of the following medications: -gatifloxacin This medicine may also interact with the following medications: -alcohol -certain medicines for blood pressure, heart disease -diuretics This list may not describe all possible interactions. Give your health care provider a list of all the medicines, herbs, non-prescription drugs, or dietary supplements you use. Also tell them if you smoke, drink alcohol, or use illegal drugs. Some items may interact with your medicine. What should I watch for while using this medicine? Visit your doctor or health care professional for regular checks on your progress. This medicine can cause a serious condition in which there is too much acid in the blood. If you develop nausea, vomiting, stomach pain, unusual tiredness, or breathing problems, stop taking this medicine and call your doctor right away. If possible, use a ketone dipstick to check for ketones in your urine. A test called the HbA1C (A1C) will be monitored. This is a simple blood test. It measures your blood sugar control over the last 2 to 3 months. You will receive this test every 3 to 6 months. Learn how to check your blood sugar. Learn the symptoms of low and high blood sugar and how to manage them. Always carry a quick-source of sugar with you in case you have symptoms of low blood sugar. Examples include hard sugar candy or glucose tablets. Make sure others know that  you can choke if you eat or drink when you develop serious symptoms of low blood sugar, such as seizures or unconsciousness. They must get medical help at once. Tell your doctor or health care professional if you have high blood sugar. You might need to change the dose of your medicine. If you are sick or exercising more  than usual, you might need to change the dose of your medicine. Do not skip meals. Ask your doctor or health care professional if you should avoid alcohol. Many nonprescription cough and cold products contain sugar or alcohol. These can affect blood sugar. Wear a medical ID bracelet or chain, and carry a card that describes your disease and details of your medicine and dosage times. What side effects may I notice from receiving this medicine? Side effects that you should report to your doctor or health care professional as soon as possible: -allergic reactions like skin rash, itching or hives, swelling of the face, lips, or tongue -breathing problems -dizziness -fast or irregular heartbeat -feeling faint or lightheaded, falls -muscle weakness -nausea, vomiting, unusual stomach upset or pain -signs and symptoms of low blood sugar such as feeling anxious, confusion, dizziness, increased hunger, unusually weak or tired, sweating, shakiness, cold, irritable, headache, blurred vision, fast heartbeat, loss of consciousness -signs and symptoms of a urinary tract infection, such as fever, chills, a burning feeling when urinating, blood in the urine, back pain -trouble passing urine or change in the amount of urine, including an urgent need to urinate more often, in larger amounts, or at night -penile discharge, itching, or pain in men -unusual tiredness -vaginal discharge, itching, or odor in women Side effects that usually do not require medical attention (report to your doctor or health care professional if they continue or are bothersome): -joint pain -mild increase in urination -thirsty This list may not describe all possible side effects. Call your doctor for medical advice about side effects. You may report side effects to FDA at 1-800-FDA-1088. Where should I keep my medicine? Keep out of the reach of children. Store at room temperature between 20 and 25 degrees C (68 and 77 degrees F). Throw  away any unused medicine after the expiration date. NOTE: This sheet is a summary. It may not cover all possible information. If you have questions about this medicine, talk to your doctor, pharmacist, or health care provider.  2018 Elsevier/Gold Standard (2015-07-18 11:46:10)  -  For your tobacco abuse: Strongly recommend that he stop smoking back and all other inhaled products right away Call 1-800-Quit-Now to get free nicotine replacement from the state of [place]  -   Steps to Quit Smoking Smoking tobacco can be bad for your health. It can also affect almost every organ in your body. Smoking puts you and people around you at risk for many serious long-lasting (chronic) diseases. Quitting smoking is hard, but it is one of the best things that you can do for your health. It is never too late to quit. What are the benefits of quitting smoking? When you quit smoking, you lower your risk for getting serious diseases and conditions. They can include:  Lung cancer or lung disease.  Heart disease.  Stroke.  Heart attack.  Not being able to have children (infertility).  Weak bones (osteoporosis) and broken bones (fractures). If you have coughing, wheezing, and shortness of breath, those symptoms may get better when you quit. You may also get sick less often. If you are pregnant, quitting smoking can help to  lower your chances of having a baby of low birth weight. What can I do to help me quit smoking? Talk with your doctor about what can help you quit smoking. Some things you can do (strategies) include:  Quitting smoking totally, instead of slowly cutting back how much you smoke over a period of time.  Going to in-person counseling. You are more likely to quit if you go to many counseling sessions.  Using resources and support systems, such as:  Online chats with a Veterinary surgeon.  Phone quitlines.  Printed Materials engineer.  Support groups or group counseling.  Text messaging  programs.  Mobile phone apps or applications.  Taking medicines. Some of these medicines may have nicotine in them. If you are pregnant or breastfeeding, do not take any medicines to quit smoking unless your doctor says it is okay. Talk with your doctor about counseling or other things that can help you. Talk with your doctor about using more than one strategy at the same time, such as taking medicines while you are also going to in-person counseling. This can help make quitting easier. What things can I do to make it easier to quit? Quitting smoking might feel very hard at first, but there is a lot that you can do to make it easier. Take these steps:  Talk to your family and friends. Ask them to support and encourage you.  Call phone quitlines, reach out to support groups, or work with a Veterinary surgeon.  Ask people who smoke to not smoke around you.  Avoid places that make you want (trigger) to smoke, such as:  Bars.  Parties.  Smoke-break areas at work.  Spend time with people who do not smoke.  Lower the stress in your life. Stress can make you want to smoke. Try these things to help your stress:  Getting regular exercise.  Deep-breathing exercises.  Yoga.  Meditating.  Doing a body scan. To do this, close your eyes, focus on one area of your body at a time from head to toe, and notice which parts of your body are tense. Try to relax the muscles in those areas.  Download or buy apps on your mobile phone or tablet that can help you stick to your quit plan. There are many free apps, such as QuitGuide from the [employer] developer for Disease Control and Prevention). You can find more support from smokefree.gov and other websites. This information is not intended to replace advice given to you by your health care provider. Make sure you discuss any questions you have with your health care provider. Document Released: 04/11/2009 Document Revised: 02/11/2016 Document Reviewed:  10/30/2014 Elsevier Interactive Patient Education  2017 Elsevier Inc.  0-  Low-Sodium Eating Plan Sodium, which is an element that makes up salt, helps you maintain a healthy balance of fluids in your body. Too much sodium can increase your blood pressure and cause fluid and waste to be held in your body. Your health care provider or dietitian may recommend following this plan if you have high blood pressure (hypertension), kidney disease, liver disease, or heart failure. Eating less sodium can help lower your blood pressure, reduce swelling, and protect your heart, liver, and kidneys. What are tips for following this plan? General guidelines   Most people on this plan should limit their sodium intake to 1,500-2,000 mg (milligrams) of sodium each day. Reading food labels   The Nutrition Facts label lists the amount of sodium in one serving of the food. If you  eat more than one serving, you must multiply the listed amount of sodium by the number of servings.  Choose foods with less than 140 mg of sodium per serving.  Avoid foods with 300 mg of sodium or more per serving. Shopping   Look for lower-sodium products, often labeled as "low-sodium" or "no salt added."  Always check the sodium content even if foods are labeled as "unsalted" or "no salt added".  Buy fresh foods.  Avoid canned foods and premade or frozen meals.  Avoid canned, cured, or processed meats  Buy breads that have less than 80 mg of sodium per slice. Cooking   Eat more home-cooked food and less restaurant, buffet, and fast food.  Avoid adding salt when cooking. Use salt-free seasonings or herbs instead of table salt or sea salt. Check with your health care provider or pharmacist before using salt substitutes.  Cook with plant-based oils, such as canola, sunflower, or olive oil. Meal planning   When eating at a restaurant, ask that your food be prepared with less salt or no salt, if possible.  Avoid foods  that contain MSG (monosodium glutamate). MSG is sometimes added to [place] food, bouillon, and some canned foods. What foods are recommended? The items listed may not be a complete list. Talk with your dietitian about what dietary choices are best for you. Grains  Low-sodium cereals, including oats, puffed wheat and rice, and shredded wheat. Low-sodium crackers. Unsalted rice. Unsalted pasta. Low-sodium bread. Whole-grain breads and whole-grain pasta. Vegetables  Fresh or frozen vegetables. "No salt added" canned vegetables. "No salt added" tomato sauce and paste. Low-sodium or reduced-sodium tomato and vegetable juice. Fruits  Fresh, frozen, or canned fruit. Fruit juice. Meats and other protein foods  Fresh or frozen (no salt added) meat, poultry, seafood, and fish. Low-sodium canned tuna and salmon. Unsalted nuts. Dried peas, beans, and lentils without added salt. Unsalted canned beans. Eggs. Unsalted nut butters. Dairy  Milk. Soy milk. Cheese that is naturally low in sodium, such as ricotta cheese, fresh mozzarella, or Swiss cheese Low-sodium or reduced-sodium cheese. Cream cheese. Yogurt. Fats and oils  Unsalted butter. Unsalted margarine with no trans fat. Vegetable oils such as canola or olive oils. Seasonings and other foods  Fresh and dried herbs and spices. Salt-free seasonings. Low-sodium mustard and ketchup. Sodium-free salad dressing. Sodium-free light mayonnaise. Fresh or refrigerated horseradish. Lemon juice. Vinegar. Homemade, reduced-sodium, or low-sodium soups. Unsalted popcorn and pretzels. Low-salt or salt-free chips. What foods are not recommended? The items listed may not be a complete list. Talk with your dietitian about what dietary choices are best for you. Grains  Instant hot cereals. Bread stuffing, pancake, and biscuit mixes. Croutons. Seasoned rice or pasta mixes. Noodle soup cups. Boxed or frozen macaroni and cheese. Regular salted crackers. Self-rising  flour. Vegetables  Sauerkraut, pickled vegetables, and relishes. Olives. [place] fries. Onion rings. Regular canned vegetables (not low-sodium or reduced-sodium). Regular canned tomato sauce and paste (not low-sodium or reduced-sodium). Regular tomato and vegetable juice (not low-sodium or reduced-sodium). Frozen vegetables in sauces. Meats and other protein foods  Meat or fish that is salted, canned, smoked, spiced, or pickled. Bacon, ham, sausage, hotdogs, corned beef, chipped beef, packaged lunch meats, salt pork, jerky, pickled herring, anchovies, regular canned tuna, sardines, salted nuts. Dairy  Processed cheese and cheese spreads. Cheese curds. Blue cheese. Feta cheese. String cheese. Regular cottage cheese. Buttermilk. Canned milk. Fats and oils  Salted butter. Regular margarine. Ghee. Bacon fat. Seasonings and other foods  Onion  salt, garlic salt, seasoned salt, table salt, and sea salt. Canned and packaged gravies. Worcestershire sauce. Tartar sauce. Barbecue sauce. Teriyaki sauce. Soy sauce, including reduced-sodium. Steak sauce. Fish sauce. Oyster sauce. Cocktail sauce. Horseradish that you find on the shelf. Regular ketchup and mustard. Meat flavorings and tenderizers. Bouillon cubes. Hot sauce and Tabasco sauce. Premade or packaged marinades. Premade or packaged taco seasonings. Relishes. Regular salad dressings. Salsa. Potato and tortilla chips. Corn chips and puffs. Salted popcorn and pretzels. Canned or dried soups. Pizza. Frozen entrees and pot pies. Summary  Eating less sodium can help lower your blood pressure, reduce swelling, and protect your heart, liver, and kidneys.  Most people on this plan should limit their sodium intake to 1,500-2,000 mg (milligrams) of sodium each day.  Canned, boxed, and frozen foods are high in sodium. Restaurant foods, fast foods, and pizza are also very high in sodium. You also get sodium by adding salt to food.  Try to cook at home, eat more fresh  fruits and vegetables, and eat less fast food, canned, processed, or prepared foods. This information is not intended to replace advice given to you by your health care provider. Make sure you discuss any questions you have with your health care provider. Document Released: 12/05/2001 Document Revised: 06/08/2016 Document Reviewed: 06/08/2016 Elsevier Interactive Patient Education  2017 Elsevier Inc.  -  Sleep Apnea Sleep apnea is a condition that affects breathing. People with sleep apnea have moments during sleep when their breathing pauses briefly or gets shallow. Sleep apnea can cause these symptoms:  Trouble staying asleep.  Sleepiness or tiredness during the day.  Irritability.  Loud snoring.  Morning headaches.  Trouble concentrating.  Forgetting things.  Less interest in sex.  Being sleepy for no reason.  Mood swings.  Personality changes.  Depression.  Waking up a lot during the night to pee (urinate).  Dry mouth.  Sore throat. Follow these instructions at home:  Make any changes in your routine that your doctor recommends.  Eat a healthy, well-balanced diet.  Take over-the-counter and prescription medicines only as told by your doctor.  Avoid using alcohol, calming medicines (sedatives), and narcotic medicines.  Take steps to lose weight if you are overweight.  If you were given a machine (device) to use while you sleep, use it only as told by your doctor.  Do not use any tobacco products, such as cigarettes, chewing tobacco, and e-cigarettes. If you need help quitting, ask your doctor.  Keep all follow-up visits as told by your doctor. This is important. Contact a doctor if:  The machine that you were given to use during sleep is uncomfortable or does not seem to be working.  Your symptoms do not get better.  Your symptoms get worse. Get help right away if:  Your chest hurts.  You have trouble breathing in enough air (shortness of  breath).  You have an uncomfortable feeling in your back, arms, or stomach.  You have trouble talking.  One side of your body feels weak.  A part of your face is hanging down (drooping). These symptoms may be an emergency. Do not wait to see if the symptoms will go away. Get medical help right away. Call your local emergency services (911 in the [place] Do not drive yourself to the hospital. This information is not intended to replace advice given to you by your health care provider. Make sure you discuss any questions you have with your health care provider. Document Released: 03/24/2008 Document  Revised: 02/09/2016 Document Reviewed: 03/25/2015 Elsevier Interactive Patient Education  2017 [employer].  -   Exercising to [employer] Exercising regularly is important. It has many health benefits, such as:  Improving your overall fitness, flexibility, and endurance.  Increasing your bone density.  Helping with weight control.  Decreasing your body fat.  Increasing your muscle strength.  Reducing stress and tension.  Improving your overall health. In order to become healthy and stay healthy, it is recommended that you do moderate-intensity and vigorous-intensity exercise. You can tell that you are exercising at a moderate intensity if you have a higher heart rate and faster breathing, but you are still able to hold a conversation. You can tell that you are exercising at a vigorous intensity if you are breathing much harder and faster and cannot hold a conversation while exercising. How often should I exercise? Choose an activity that you enjoy and set realistic goals. Your health care provider can help you to make an activity plan that works for you. Exercise regularly as directed by your health care provider. This may include:  Doing resistance training twice each week, such as:  Push-ups.  Sit-ups.  Lifting weights.  Using resistance bands.  Doing a given intensity of  exercise for a given amount of time. Choose from these options:  150 minutes of moderate-intensity exercise every week.  75 minutes of vigorous-intensity exercise every week.  A mix of moderate-intensity and vigorous-intensity exercise every week. Children, pregnant women, people who are out of shape, people who are overweight, and older adults may need to consult a health care provider for individual recommendations. If you have any sort of medical condition, be sure to consult your health care provider before starting a new exercise program. What are some exercise ideas? Some moderate-intensity exercise ideas include:  Walking at a rate of 1 mile in 15 minutes.  Biking.  Hiking.  Golfing.  Dancing. Some vigorous-intensity exercise ideas include:  Walking at a rate of at least 4.5 miles per hour.  Jogging or running at a rate of 5 miles per hour.  Biking at a rate of at least 10 miles per hour.  Lap swimming.  Roller-skating or in-line skating.  Cross-country skiing.  Vigorous competitive sports, such as football, basketball, and soccer.  Jumping rope.  Aerobic dancing. What are some everyday activities that can help me to get exercise?  Yard work, such as:  Child psychotherapist.  Raking and bagging leaves.  Washing and waxing your car.  Pushing a stroller.  Shoveling snow.  Gardening.  Washing windows or floors. How can I be more active in my day-to-day activities?  Use the stairs instead of the elevator.  Take a walk during your lunch break.  If you drive, park your car farther away from work or school.  If you take public transportation, get off one stop early and walk the rest of the way.  Make all of your phone calls while standing up and walking around.  Get up, stretch, and walk around every 30 minutes throughout the day. What guidelines should I follow while exercising?  Do not exercise so much that you hurt yourself, feel dizzy, or get  very short of breath.  Consult your health care provider before starting a new exercise program.  Wear comfortable clothes and shoes with good support.  Drink plenty of water while you exercise to prevent dehydration or heat stroke. Body water is lost during exercise and must be replaced.  Work  out until you breathe faster and your heart beats faster. This information is not intended to replace advice given to you by your health care provider. Make sure you discuss any questions you have with your health care provider. Document Released: 07/18/2010 Document Revised: 11/21/2015 Document Reviewed: 11/16/2013 Elsevier Interactive Patient Education  2017 Elsevier Inc.  -  Benign Prostatic Hyperplasia Benign prostatic hyperplasia is when the prostate gland is bigger than normal (enlarged). The prostate is a gland that produces the fluid that goes into semen. It is near the opening to the bladder and it surrounds the tube that drains urine out of the body (urethra). Benign prostatic hyperplasia is common among older men and it typically causes problems with urinating. The prostate grows slowly as you age. As the prostate grows, it can pinch the urethra. This causes the bladder to work too hard to pass urine, which leads to a thickened bladder wall. The bladder may eventually become weak and unable to empty completely. What are the causes? The exact cause of this condition is not known. It may be related to changes in hormones as the body ages. What increases the risk? You are more likely to develop this condition if:  You have a family history of the condition.  You are age 77 or older.  You have a history of erectile dysfunction.  You do not exercise.  You have certain medical conditions, including:  Type 2 diabetes.  Obesity.  Heart and circulatory disease. What are the signs or symptoms? Symptoms of this condition include:  Weak or interrupted urine stream.  Dribbling or  leaking urine.  Feeling like the bladder has not emptied completely.  Difficulty starting urination.  Getting up frequently at night to urinate.  Urinating more often (8 or more times a day).  Accidental loss of urine (urinary incontinence).  Pain during urination or ejaculation.  Urine with an unusual smell or color. The size of the prostate does not always determine the severity of the symptoms. For example, a man with a large prostate may experience minor symptoms, or a man with a smaller prostate may experience a severe blockage. How is this diagnosed? This condition may be diagnosed based on:  Your medical history and symptoms.  A physical exam. This usually includes a digital rectal exam. During this exam, your health care provider places a gloved, lubricated finger into the rectum to feel the size of the prostate.  A blood test. This test checks for high levels of a protein that is produced by the prostate (prostate specific antigen, PSA).  Tests to examine how well the urethra and bladder are functioning (urodynamic tests).  Cystoscopy. For this test, a small, tube-shaped instrument (cystoscope) is used to look inside the urethra and bladder. The cystoscope is placed into the urinary tract through the opening at the tip of the penis.  Urine tests.  Ultrasound. How is this treated? Treatment for this condition depends on how severe your symptoms are. Treatment may include:  Active surveillance or "watchful waiting." If your symptoms are mild, your health care provider may delay treatment and ask you to keep track of your symptoms. You will have regular checkups to examine the size of your prostate, discuss symptoms, and determine whether treatment is needed.  Medicines. These may be used to:  Stop prostate growth.  Shrink the prostate.  Relieve symptoms.  Lifestyle changes, including:  Pelvic floor muscle exercises. The pelvic floor muscles are a group of muscles  that relax when  you urinate.  Bladder training. This involves exercises that train the bladder to hold more urine for longer periods.  Reducing the amount of liquid that you drink. This is especially important before sleeping and before long periods of time spent in public.  Reducing the amount of caffeine and alcohol that you drink.  Treating or preventing constipation.  Surgery to reduce the size of the prostate or widen the urethra. This is typically done if your symptoms are severe or there are serious complications from the enlarged prostate. Follow these instructions at home: Medicines   Take over-the-counter and prescription medicines as told by your health care provider.  Avoid certain medicines, such as decongestants, antihistamines, and some prescription medicines as told by your health care provider. Ask your health care provider which medicines you should avoid. General instructions   Monitor your symptoms for any changes. Tell your health care provider about any changes.  Give yourself time when you urinate.  Avoid certain beverages that can irritate the bladder, such as:  Alcohol.  Caffeinated drinks like coffee, tea, and cola.  Avoid drinking large amounts of liquid before bed or before going out in public.  Do pelvic floor muscle or bladder training exercises as told by your health care provider.  Keep all follow-up visits as told by your health care provider. This is important. Contact a health care provider if:  Your develop new or worse symptoms.  You have trouble getting or maintaining an erection.  You have a fever.  You have pain or burning during urination.  You have blood in your urine. Get help right away if:  You have severe pain when urinating.  You cannot urinate.  You have severe pain in your abdomen.  You are dizzy.  You faint.  You have severe back pain.  Your urine is dark red and difficult to see through.  You have large  blood clots in your urine.  You have severe pain after an erection.  You have chest pain, dizziness, or nausea during sexual activity. Summary  The prostate is a gland that produces the fluid that goes into semen. It is near the opening to the bladder and it surrounds the tube that drains urine out of the body (urethra).  Benign prostatic hyperplasia is common among older men and it typically causes problems with urinating.  If your symptoms are mild, your health care provider may delay treatment and ask you to keep track of your symptoms. You will have regular checkups to examine the size of your prostate, discuss symptoms, and determine whether treatment is needed.  If directed, you may need to avoid certain medicines, such as decongestants, antihistamines, and some prescription medicines.  Contact your health care provider if you develop new or worse symptoms. This information is not intended to replace advice given to you by your health care provider. Make sure you discuss any questions you have with your health care provider. Document Released: 06/15/2005 Document Revised: 05/04/2016 Document Reviewed: 05/04/2016 Elsevier Interactive Patient Education  2017 [employer].

## 2016-11-18 MED FILL — ?EZETIMIBE 10 MG TAB: 10 | 30 days supply | Qty: 30 | Fill #0

## 2016-11-18 MED FILL — **JARDIANCE 10 MG TABLET: 10 | 14 days supply | Qty: 14 | Fill #0

## 2016-11-19 MED FILL — $LANTUS 100 UNITS/ML VIAL: 100 | 10 days supply | Qty: 10 | Fill #2

## 2016-11-24 ENCOUNTER — Ambulatory Visit: Payer: Self-pay | Attending: Internal Medicine

## 2016-11-26 ENCOUNTER — Ambulatory Visit (HOSPITAL_COMMUNITY)
Admission: EM | Admit: 2016-11-26 | Discharge: 2016-11-26 | Disposition: A | Discharge status: Home / Self Care | Payer: Self-pay | Attending: Internal Medicine | Admitting: Internal Medicine

## 2016-11-26 ENCOUNTER — Encounter (HOSPITAL_COMMUNITY): Payer: Self-pay | Admitting: *Deleted

## 2016-11-26 DIAGNOSIS — R197 Diarrhea, unspecified: Secondary | ICD-10-CM

## 2016-11-26 MED ORDER — DIPHENOXYLATE-ATROPINE 2.5-0.025 MG PO TABS: 1.0000 | ORAL_TABLET | Freq: Four times a day (QID) | ORAL | 0 refills | Status: DC | PRN

## 2016-11-26 NOTE — ED Provider Notes (Signed)
CSN: [id number]     Arrival date & time 11/26/16  1352 History   First MD Initiated Contact with Patient 11/26/16 1431     Chief Complaint  Patient presents with  . Diarrhea   (Consider location/radiation/quality/duration/timing/severity/associated sxs/prior Treatment) The history is provided by the patient.  Diarrhea  Quality:  Copious, mucous, watery and semi-solid Severity:  Moderate Onset quality:  Gradual Number of episodes:  7 Duration:  1 day Timing:  Constant Progression:  Unchanged Relieved by:  None tried Worsened by:  Nothing Ineffective treatments:  None tried Associated symptoms: no abdominal pain, no chills, no recent cough, no fever, no headaches, no myalgias, no URI and no vomiting   Risk factors: suspect food intake   Risk factors: no recent antibiotic use, no sick contacts and no travel to endemic areas     Past Medical History:  Diagnosis Date  . Coronary artery disease   . Diabetes mellitus without complication ([place])   . Heartburn   . High cholesterol   . Hyperlipidemia associated with type 2 diabetes mellitus ([place])   . Hypertension   . MI (myocardial infarction) ([place]) 07/2009   [facility] in [place], [place] for last 2, 1st one in [place], [place]; total of 6 stents, last one 02/2014   . Morbid obesity ([place]) 07/06/2016  . OSA (obstructive sleep apnea) 07/06/2016   Moderate with AHI 21.8/hr by PSG 08/2012 now on CPAP  . Thyroid disease    past history   Past Surgical History:  Procedure Laterality Date  . APPENDECTOMY    . CARDIAC CATHETERIZATION N/A 09/16/2015   Procedure: Left Heart Cath and Coronary Angiography;  Surgeon: Lorretta Harp, MD;  Location: [place] [facility];  Service: Cardiovascular;  Laterality: N/A;  . CARDIAC CATHETERIZATION N/A 09/16/2015   Procedure: Coronary Stent Intervention;  Surgeon: Lorretta Harp, MD;  Location: [place] [facility];  Service: Cardiovascular;  Laterality: N/A;  . CORONARY STENT PLACEMENT     Family  History  Problem Relation Age of Onset  . Adopted: Yes  . Diabetes Maternal Grandmother    Social History  Substance Use Topics  . Smoking status: Current Every Day Smoker    Packs/day: 1.00    Years: 33.00    Types: Cigarettes  . Smokeless tobacco: Never Used  . Alcohol use No    Review of Systems  Constitutional: Negative for appetite change, chills and fever.  HENT: Negative.   Respiratory: Negative.   Cardiovascular: Negative.   Gastrointestinal: Positive for diarrhea. Negative for abdominal distention, abdominal pain, blood in stool, nausea and vomiting.  Musculoskeletal: Negative for myalgias and neck stiffness.  Skin: Negative.   Neurological: Negative for light-headedness and headaches.    Allergies  Other and Levofloxacin  Home Medications   Prior to Admission medications   Medication Sig Start Date End Date Taking? Authorizing Provider  amLODipine (NORVASC) 5 MG tablet Take 1 tablet (5 mg total) by mouth daily. 11/17/16 02/15/17  Maren Reamer, MD  aspirin 81 MG tablet Take 1 tablet (81 mg total) by mouth daily. 11/17/16   Maren Reamer, MD  benzocaine (ORAJEL) 10 % mucosal gel Use as directed 1 application in the mouth or throat as needed for mouth pain. 10/01/16   Alfonse Spruce, FNP  blood glucose meter kit and supplies KIT Dispense based on patient and insurance preference. Use up to four times daily as directed. (FOR ICD-9 250.00, 250.01). 09/17/15   Mendel Corning, MD  buPROPion (WELLBUTRIN SR) 150 MG 12 hr tablet Take 1 tablet (150 mg total) by mouth 2 (two) times daily. 11/17/16   Maren Reamer, MD  clopidogrel (PLAVIX) 75 MG tablet Take 1 tablet (75 mg total) by mouth daily. 11/17/16   Maren Reamer, MD  diphenoxylate-atropine (LOMOTIL) 2.5-0.025 MG tablet Take 1 tablet by mouth 4 (four) times daily as needed for diarrhea or loose stools. 11/26/16   Barnet Glasgow, NP  empagliflozin (JARDIANCE) 10 MG TABS tablet Take 10 mg by mouth daily.  11/17/16   Maren Reamer, MD  ezetimibe (ZETIA) 10 MG tablet Take 1 tablet (10 mg total) by mouth daily. 11/17/16 02/15/17  Maren Reamer, MD  famotidine (PEPCID) 20 MG tablet Take 1 tablet (20 mg total) by mouth 2 (two) times daily. 09/01/16   Langeland, Leda Quail, MD  fluticasone (FLONASE) 50 MCG/ACT nasal spray Place 2 sprays into both nostrils daily. 11/17/16   Maren Reamer, MD  furosemide (LASIX) 40 MG tablet Take 1 tablet (40 mg total) by mouth 3 (three) times a week. 05/25/16 08/23/16  Fay Records, MD  gabapentin (NEURONTIN) 300 MG capsule Take 3 capsules (900 mg total) by mouth 3 (three) times daily. 07/06/16   Lottie Mussel T, MD  glucose blood (TRUE METRIX BLOOD GLUCOSE TEST) test strip Use as instructed 11/17/16   Lottie Mussel T, MD  hydrochlorothiazide (HYDRODIURIL) 25 MG tablet Take 1 tablet (25 mg total) by mouth daily. 11/17/16   Maren Reamer, MD  ibuprofen (ADVIL,MOTRIN) 800 MG tablet TAKE 1 TABLET BY MOUTH EVERY 8 HOURS AS NEEDED FOR MODERATE PAIN (TAKE WITH FOOD). 10/28/16   Langeland, Dawn T, MD  insulin aspart (NOVOLOG FLEXPEN) 100 UNIT/ML FlexPen Inject 16-20 Units into the skin 3 (three) times daily with meals. 16 units for smaller meals, 20 units for normal meals. 09/23/16   Maren Reamer, MD  Insulin Glargine (LANTUS SOLOSTAR) 100 UNIT/ML Solostar Pen Inject 45 Units into the skin 2 (two) times daily. 09/23/16   Maren Reamer, MD  insulin lispro (HUMALOG KWIKPEN) 100 UNIT/ML KiwkPen Inject 16-20 units into the skin 3 (three) times daily with meals. 16 units for smaller meals, 20 units for normal meals 08/18/16   Lottie Mussel T, MD  Insulin Pen Needle 31G X 8 MM MISC Use as directed 08/06/16   Lottie Mussel T, MD  Insulin Syringes, Disposable, U-100 0.5 ML MISC Take insulin as directed   Diagnosis: Insulin dependent diabetes mellitus ICD10 250 11/04/15   Langeland, Dawn T, MD  lisinopril (PRINIVIL,ZESTRIL) 10 MG tablet Take 1 tablet (10 mg total) by mouth 2 (two)  times daily. 11/17/16   Maren Reamer, MD  loratadine (CLARITIN) 10 MG tablet Take 1 tablet (10 mg total) by mouth daily. 11/17/16   Langeland, Leda Quail, MD  metFORMIN (GLUCOPHAGE-XR) 500 MG 24 hr tablet TAKE 2 TABLETS BY MOUTH 2 TIMES DAILY WITH A MEAL. 11/17/16   Langeland, Dawn T, MD  metoprolol tartrate (LOPRESSOR) 50 MG tablet Take 1 tablet (50 mg total) by mouth 2 (two) times daily. 11/17/16 02/15/17  Maren Reamer, MD  nicotine (NICOTROL) 10 MG inhaler Inhale 1 cartridge (1 continuous puffing total) into the lungs as needed for smoking cessation. 09/30/15   Imogene Burn, PA-C  nicotine polacrilex (NICORETTE) 4 MG gum Take 1 each (4 mg total) by mouth as needed for smoking cessation. 07/21/16   Maren Reamer, MD  nitroGLYCERIN (NITROSTAT) 0.4 MG SL tablet Place 1 tablet (  0.4 mg total) under the tongue every 5 (five) minutes as needed for chest pain. 09/17/15   Rai, Ripudeep K, MD  potassium chloride (K-DUR) 10 MEQ tablet Take 1 tablet (10 mEq total) by mouth daily. 09/17/16 12/16/16  Tresa Garter, MD  rosuvastatin (CRESTOR) 40 MG tablet Take 1 tablet (40 mg total) by mouth daily. 11/17/16 02/15/17  Maren Reamer, MD  tamsulosin (FLOMAX) 0.4 MG CAPS capsule Take 1 capsule (0.4 mg total) by mouth daily. 11/17/16   Maren Reamer, MD  TRUEPLUS LANCETS 28G MISC Use as directed 11/17/16   Maren Reamer, MD   Meds Ordered and Administered this Visit  Medications - No data to display  BP 140/82 (BP Location: Right Arm)   Pulse 72   Temp 98.6 F (37 C) (Oral)   Resp 18   SpO2 100%  No data found.   Physical Exam  Constitutional: He is oriented to person, place, and time. He appears well-developed and well-nourished. No distress.  HENT:  Head: Normocephalic and atraumatic.  Right Ear: External ear normal.  Left Ear: External ear normal.  Eyes: Conjunctivae are normal.  Neck: Normal range of motion.  Cardiovascular: Normal rate and regular rhythm.   Pulmonary/Chest:  Effort normal and breath sounds normal.  Abdominal: Soft. Bowel sounds are normal. He exhibits no distension. There is no tenderness. There is no rebound and no guarding.  Neurological: He is alert and oriented to person, place, and time.  Skin: Skin is warm and dry. Capillary refill takes less than 2 seconds. He is not diaphoretic.  Psychiatric: He has a normal mood and affect. His behavior is normal.  Nursing note and vitals reviewed.   Urgent Care Course     Procedures (including critical care time)  Labs Review Labs Reviewed - No data to display  Imaging Review No results found.     MDM   1. Diarrhea, unspecified type    Recommend rest, plenty of fluids, strict follow-up guidelines provided to the patient, provided counseling on diet, given lomotil for diarrhea, follow up as needed.    Barnet Glasgow, NP 11/26/16 [phone]

## 2016-11-26 NOTE — Discharge Instructions (Signed)
For your diarrhea, I have prescribed Lomotil, you have one tablet up to 4 times a day. Do not take more than this amount as it can cause constipation. I recommend clear liquid diet for the next 24-48 hours such as beef broth, vegetable broth, chicken broth, etc. And slowly reintroduce simple foods and to your diet such as bananas, rice, toast etc. If your symptoms persist past one week, or at any time you become dehydrated go to the ER.

## 2016-11-26 NOTE — ED Triage Notes (Signed)
Diarrhea   Since yesterday        6   X      No   Nausea     Or  Vomiting

## 2016-12-01 NOTE — Telephone Encounter (Signed)
Patient walked into the office today with his cpap chip so we could get a download but his chip was a [employer] brand and it is not compatible with our system. I asked him to take it to his DME and get a download but he could not remember who that was. [facility] and he did but found out he was not one of their patients and he was unable to get a download. Patient states his machine is around 48 years old and thinks he may have bought his cpap online.

## 2016-12-02 NOTE — Telephone Encounter (Signed)
Please set him up for a split night sleep study in lab so he can get a new machine

## 2016-12-04 NOTE — Telephone Encounter (Addendum)
Informed patient of upcoming sleep study and patient understanding was verbalized. Patient understands his sleep study is scheduled for Tuesday February 02 2017. Patient [facility]. Patient understands he will receive a sleep packet in a week or so. Patient understands to call if he does not receive the sleep packet in a timely manner. Patient agrees with treatment and thanked me for call

## 2016-12-06 ENCOUNTER — Encounter (HOSPITAL_BASED_OUTPATIENT_CLINIC_OR_DEPARTMENT_OTHER): Payer: Self-pay | Admitting: *Deleted

## 2016-12-06 ENCOUNTER — Emergency Department (HOSPITAL_BASED_OUTPATIENT_CLINIC_OR_DEPARTMENT_OTHER)
Admission: EM | Admit: 2016-12-06 | Discharge: 2016-12-06 | Disposition: A | Discharge status: Home / Self Care | Payer: Self-pay | Attending: Emergency Medicine | Admitting: Emergency Medicine

## 2016-12-06 ENCOUNTER — Emergency Department (HOSPITAL_BASED_OUTPATIENT_CLINIC_OR_DEPARTMENT_OTHER): Payer: Self-pay

## 2016-12-06 DIAGNOSIS — E1140 Type 2 diabetes mellitus with diabetic neuropathy, unspecified: Secondary | ICD-10-CM | POA: Insufficient documentation

## 2016-12-06 DIAGNOSIS — F17210 Nicotine dependence, cigarettes, uncomplicated: Secondary | ICD-10-CM | POA: Insufficient documentation

## 2016-12-06 DIAGNOSIS — Z794 Long term (current) use of insulin: Secondary | ICD-10-CM | POA: Insufficient documentation

## 2016-12-06 DIAGNOSIS — Z7982 Long term (current) use of aspirin: Secondary | ICD-10-CM | POA: Insufficient documentation

## 2016-12-06 DIAGNOSIS — Z79899 Other long term (current) drug therapy: Secondary | ICD-10-CM | POA: Insufficient documentation

## 2016-12-06 DIAGNOSIS — K5900 Constipation, unspecified: Secondary | ICD-10-CM | POA: Insufficient documentation

## 2016-12-06 DIAGNOSIS — R109 Unspecified abdominal pain: Secondary | ICD-10-CM | POA: Insufficient documentation

## 2016-12-06 DIAGNOSIS — I2510 Atherosclerotic heart disease of native coronary artery without angina pectoris: Secondary | ICD-10-CM | POA: Insufficient documentation

## 2016-12-06 DIAGNOSIS — L290 Pruritus ani: Secondary | ICD-10-CM | POA: Insufficient documentation

## 2016-12-06 DIAGNOSIS — I10 Essential (primary) hypertension: Secondary | ICD-10-CM | POA: Insufficient documentation

## 2016-12-06 MED ORDER — POLYETHYLENE GLYCOL 3350 17 G PO PACK: 17.0000 g | PACK | Freq: Every day | ORAL | 0 refills | Status: DC

## 2016-12-06 MED ORDER — HYDROCORTISONE 1 % EX CREA: TOPICAL_CREAM | CUTANEOUS | 0 refills | Status: DC

## 2016-12-06 NOTE — ED Triage Notes (Signed)
Pt states he has had anal itching for several days. Also reports gas.

## 2016-12-06 NOTE — ED Provider Notes (Signed)
[facility] DEPT M[facility] Provider Note   CSN: [id number] Arrival date & time: 12/06/16  1934   By signing my name below, I, Soijett Blue, attest that this documentation has been prepared under the direction and in the presence of Jeannett Senior, [employer] Electronically Signed: [place], ED Scribe. 12/06/16. 8:24 PM.  History   Chief Complaint Chief Complaint  Patient presents with  . Anal Itching    HPI Marc Walker is a 46 y.o. male with a PMHx of DM, HTN, who presents to the Emergency Department complaining of anal itching onset 5 days ago. Pt reports associated increased flatus. Pt has not tried any medications for the relief of his symptoms. He denies rectal pain, abdominal pain, fever, chills, and any other symptoms.Patient states he is unable to wipe his rectum or apply any cream because he can't reach it. He is unable to do baths because she's unable to sit down on the ground and get up. He does not have a help around the house.   The history is provided by the patient. No language interpreter was used.    Past Medical History:  Diagnosis Date  . Coronary artery disease   . Diabetes mellitus without complication ([place])   . Heartburn   . High cholesterol   . Hyperlipidemia associated with type 2 diabetes mellitus ([place])   . Hypertension   . MI (myocardial infarction) ([place]) 07/2009   [facility] in [place], [place] for last 2, 1st one in [place], [place]; total of 6 stents, last one 02/2014   . Morbid obesity ([place]) 07/06/2016  . OSA (obstructive sleep apnea) 07/06/2016   Moderate with AHI 21.8/hr by PSG 08/2012 now on CPAP  . Thyroid disease    past history    Patient Active Problem List   Diagnosis Date Noted  . Benign prostatic hyperplasia with urinary frequency 11/17/2016  . Left flank pain 07/10/2016  . Leukocytosis 07/10/2016  . SIRS (systemic inflammatory response syndrome) ([place]) 07/10/2016  . Elevated troponin   . Pleurisy   . OSA (obstructive sleep apnea)  07/06/2016  . Morbid obesity ([place]) 07/06/2016  . DM type 2 with diabetic peripheral neuropathy ([place]) 12/16/2015  . CAD S/P multiple PCI's 09/30/2015  . Tobacco abuse 09/30/2015  . Essential hypertension 09/16/2015  . Hyperlipidemia associated with type 2 diabetes mellitus ([place])   . Hyperlipidemia   . Chest pain 09/15/2015  . MI (myocardial infarction) ([place]) 07/30/2009    Past Surgical History:  Procedure Laterality Date  . APPENDECTOMY    . CARDIAC CATHETERIZATION N/A 09/16/2015   Procedure: Left Heart Cath and Coronary Angiography;  Surgeon: Lorretta Harp, MD;  Location: [facility];  Service: Cardiovascular;  Laterality: N/A;  . CARDIAC CATHETERIZATION N/A 09/16/2015   Procedure: Coronary Stent Intervention;  Surgeon: Lorretta Harp, MD;  Location: [place] [facility];  Service: Cardiovascular;  Laterality: N/A;  . CORONARY STENT PLACEMENT         Home Medications    Prior to Admission medications   Medication Sig Start Date End Date Taking? Authorizing Provider  amLODipine (NORVASC) 5 MG tablet Take 1 tablet (5 mg total) by mouth daily. 11/17/16 02/15/17  Maren Reamer, MD  aspirin 81 MG tablet Take 1 tablet (81 mg total) by mouth daily. 11/17/16   Maren Reamer, MD  benzocaine (ORAJEL) 10 % mucosal gel Use as directed 1 application in the mouth or throat as needed for mouth pain. 10/01/16   Alfonse Spruce,  FNP  blood glucose meter kit and supplies KIT Dispense based on patient and insurance preference. Use up to four times daily as directed. (FOR ICD-9 250.00, 250.01). 09/17/15   Rai, Ripudeep K, MD  buPROPion (WELLBUTRIN SR) 150 MG 12 hr tablet Take 1 tablet (150 mg total) by mouth 2 (two) times daily. 11/17/16   Maren Reamer, MD  clopidogrel (PLAVIX) 75 MG tablet Take 1 tablet (75 mg total) by mouth daily. 11/17/16   Maren Reamer, MD  diphenoxylate-atropine (LOMOTIL) 2.5-0.025 MG tablet Take 1 tablet by mouth 4 (four) times daily as needed for  diarrhea or loose stools. 11/26/16   Barnet Glasgow, NP  empagliflozin (JARDIANCE) 10 MG TABS tablet Take 10 mg by mouth daily. 11/17/16   Maren Reamer, MD  ezetimibe (ZETIA) 10 MG tablet Take 1 tablet (10 mg total) by mouth daily. 11/17/16 02/15/17  Maren Reamer, MD  famotidine (PEPCID) 20 MG tablet Take 1 tablet (20 mg total) by mouth 2 (two) times daily. 09/01/16   Langeland, Leda Quail, MD  fluticasone (FLONASE) 50 MCG/ACT nasal spray Place 2 sprays into both nostrils daily. 11/17/16   Maren Reamer, MD  furosemide (LASIX) 40 MG tablet Take 1 tablet (40 mg total) by mouth 3 (three) times a week. 05/25/16 08/23/16  Fay Records, MD  gabapentin (NEURONTIN) 300 MG capsule Take 3 capsules (900 mg total) by mouth 3 (three) times daily. 07/06/16   Lottie Mussel T, MD  glucose blood (TRUE METRIX BLOOD GLUCOSE TEST) test strip Use as instructed 11/17/16   Lottie Mussel T, MD  hydrochlorothiazide (HYDRODIURIL) 25 MG tablet Take 1 tablet (25 mg total) by mouth daily. 11/17/16   Maren Reamer, MD  ibuprofen (ADVIL,MOTRIN) 800 MG tablet TAKE 1 TABLET BY MOUTH EVERY 8 HOURS AS NEEDED FOR MODERATE PAIN (TAKE WITH FOOD). 10/28/16   Langeland, Dawn T, MD  insulin aspart (NOVOLOG FLEXPEN) 100 UNIT/ML FlexPen Inject 16-20 Units into the skin 3 (three) times daily with meals. 16 units for smaller meals, 20 units for normal meals. 09/23/16   Maren Reamer, MD  Insulin Glargine (LANTUS SOLOSTAR) 100 UNIT/ML Solostar Pen Inject 45 Units into the skin 2 (two) times daily. 09/23/16   Maren Reamer, MD  insulin lispro (HUMALOG KWIKPEN) 100 UNIT/ML KiwkPen Inject 16-20 units into the skin 3 (three) times daily with meals. 16 units for smaller meals, 20 units for normal meals 08/18/16   Lottie Mussel T, MD  Insulin Pen Needle 31G X 8 MM MISC Use as directed 08/06/16   Lottie Mussel T, MD  Insulin Syringes, Disposable, U-100 0.5 ML MISC Take insulin as directed   Diagnosis: Insulin dependent diabetes mellitus  ICD10 250 11/04/15   Langeland, Dawn T, MD  lisinopril (PRINIVIL,ZESTRIL) 10 MG tablet Take 1 tablet (10 mg total) by mouth 2 (two) times daily. 11/17/16   Maren Reamer, MD  loratadine (CLARITIN) 10 MG tablet Take 1 tablet (10 mg total) by mouth daily. 11/17/16   Langeland, Leda Quail, MD  metFORMIN (GLUCOPHAGE-XR) 500 MG 24 hr tablet TAKE 2 TABLETS BY MOUTH 2 TIMES DAILY WITH A MEAL. 11/17/16   Langeland, Dawn T, MD  metoprolol tartrate (LOPRESSOR) 50 MG tablet Take 1 tablet (50 mg total) by mouth 2 (two) times daily. 11/17/16 02/15/17  Maren Reamer, MD  nicotine (NICOTROL) 10 MG inhaler Inhale 1 cartridge (1 continuous puffing total) into the lungs as needed for smoking cessation. 09/30/15   Imogene Burn, PA-C  nicotine polacrilex (NICORETTE) 4 MG gum Take 1 each (4 mg total) by mouth as needed for smoking cessation. 07/21/16   Maren Reamer, MD  nitroGLYCERIN (NITROSTAT) 0.4 MG SL tablet Place 1 tablet (0.4 mg total) under the tongue every 5 (five) minutes as needed for chest pain. 09/17/15   Rai, Ripudeep K, MD  potassium chloride (K-DUR) 10 MEQ tablet Take 1 tablet (10 mEq total) by mouth daily. 09/17/16 12/16/16  Tresa Garter, MD  rosuvastatin (CRESTOR) 40 MG tablet Take 1 tablet (40 mg total) by mouth daily. 11/17/16 02/15/17  Maren Reamer, MD  tamsulosin (FLOMAX) 0.4 MG CAPS capsule Take 1 capsule (0.4 mg total) by mouth daily. 11/17/16   Maren Reamer, MD  TRUEPLUS LANCETS 28G MISC Use as directed 11/17/16   Maren Reamer, MD    Family History Family History  Problem Relation Age of Onset  . Adopted: Yes  . Diabetes Maternal Grandmother     Social History Social History  Substance Use Topics  . Smoking status: Current Every Day Smoker    Packs/day: 1.00    Years: 33.00    Types: Cigarettes  . Smokeless tobacco: Never Used  . Alcohol use No     Allergies   Other and Levofloxacin   Review of Systems Review of Systems  Constitutional: Negative for chills  and fever.  Gastrointestinal: Negative for abdominal pain and rectal pain.       +anal itching. +increased flatus     Physical Exam Updated Vital Signs BP 132/89 (BP Location: Left Arm)   Pulse 88   Temp 98.1 F (36.7 C) (Oral)   Resp (!) 26   Ht '5\' 5"'  (1.651 m)   Wt 290 lb (131.5 kg)   SpO2 96%   BMI 48.26 kg/m   Physical Exam  Constitutional: He is oriented to person, place, and time. He appears well-developed and well-nourished. No distress.  HENT:  Head: Normocephalic and atraumatic.  Eyes: Conjunctivae and EOM are normal.  Neck: Neck supple.  Cardiovascular: Normal rate, regular rhythm and normal heart sounds.   Pulmonary/Chest: Effort normal. No respiratory distress. He has no wheezes. He has no rales.  Abdominal: Soft. Bowel sounds are normal. He exhibits no distension. There is no tenderness. There is no rebound and no guarding.  Genitourinary:  Genitourinary Comments: Normal rectum. No fissures, skin irritation, external hemorrhoids. Few very tiny protruding internal hemorrhoids  Musculoskeletal: Normal range of motion. He exhibits no edema.  Neurological: He is alert and oriented to person, place, and time.  Skin: Skin is warm and dry.  Psychiatric: He has a normal mood and affect. His behavior is normal.  Nursing note and vitals reviewed.    ED Treatments / Results  DIAGNOSTIC STUDIES: Oxygen Saturation is 96% on RA, nl by my interpretation.    COORDINATION OF CARE: 8:31 PM Discussed treatment plan with pt at bedside and pt agreed to plan.   Labs (all labs ordered are listed, but only abnormal results are displayed) Labs Reviewed - No data to display  EKG  EKG Interpretation None       Radiology Dg Abd 2 Views  Result Date: 12/06/2016 CLINICAL DATA:  Abdominal pain.  Gas and bloating. EXAM: ABDOMEN - 2 VIEW COMPARISON:  07/29/2016 lumbar spine radiographs. FINDINGS: Upright and supine views. The upright view demonstrates no free intraperitoneal  air. No significant air fluid levels. The supine view demonstrates a moderate to large amount of colonic stool. No gaseous distention of  small bowel. Distal gas identified. No abnormal abdominal calcifications. No appendicolith. Cardiomegaly. IMPRESSION: No acute findings. Possible constipation. Electronically Signed   By: Abigail Miyamoto M.D.   On: 12/06/2016 21:27    Procedures Procedures (including critical care time)  Medications Ordered in ED Medications - No data to display   Initial Impression / Assessment and Plan / ED Course  I have reviewed the triage vital signs and the nursing notes.  Pertinent labs & imaging results that were available during my care of the patient were reviewed by me and considered in my medical decision making (see chart for details).   patient in emergency department with increased gas, and rectal itching. Patient's abdomen is benign. Patient had admitted to having a poor diet and not eating healthy. He is morbidly obese. He denies any abdominal pain, nausea, changes in his bowels. He states his gas is malodorous. He has not tried any medications. I will get an x-ray of his abdomen to evaluate gas patterns. She is also complaining of anal itching. He has not traveled recently, has not seen any pinworms in his stool, he does have a few small protruding internal hemorrhoids, otherwise unremarkable rectal exam. He does admit that he is unable to wipe his bottom. He is unable to put any cream or do any wipes. He also is unable to do sitz baths because he is unable to sit on the ground or in a bathtub and unable to get up due to his body habitus. I have advised to him to maybe get a sponge on a stick to use to wash and to apply cream, will start hydrocortisone cream. He can also douche.   X-ray showing possible constipation. Will start the MiraLAX to regulate his bowels. Patient is otherwise nontoxic appearing, vital signs unremarkable, he stable for discharge home. I do  not think he needs any further evaluation of his symptoms in emergency department. He will follow-up with family doctor  Vitals:   12/06/16 1940 12/06/16 2153  BP: 132/89 (!) 115/52  Pulse: 88 82  Resp: (!) 26 18  Temp: 98.1 F (36.7 C)   TempSrc: Oral   SpO2: 96% 96%  Weight: 131.5 kg (290 lb)   Height: '5\' 5"'  (1.651 m)      Final Clinical Impressions(s) / ED Diagnoses   Final diagnoses:  Abdominal pain  Rectal itching  Constipation, unspecified constipation type    New Prescriptions New Prescriptions   HYDROCORTISONE CREAM 1 %    Apply to affected area 2 times daily   POLYETHYLENE GLYCOL (MIRALAX) PACKET    Take 17 g by mouth daily.   I personally performed the services described in this documentation, which was scribed in my presence. The recorded information has been reviewed and is accurate.     Jeannett Senior, PA-C 12/06/16 2211    Margette Fast, MD 12/07/16 1053

## 2016-12-06 NOTE — ED Notes (Signed)
PA at bedside.

## 2016-12-06 NOTE — Discharge Instructions (Signed)
Warm baths or soaks. Apply hydrocortisone cream, you can use sponge on a stick to reach the area. Take miralax daily to help prevent constipation. Try Gas-x for gas. Follow up with family doctor.

## 2016-12-14 MED FILL — EZETIMIBE 10 MG TABLET: 10 | 30 days supply | Qty: 30 | Fill #1

## 2016-12-14 MED FILL — BUPROPION SR 150 MG TABLET: 150 | 30 days supply | Qty: 60 | Fill #1

## 2016-12-14 MED FILL — LISINOPRIL 10 MG TABLET: 10 | 30 days supply | Qty: 60 | Fill #1

## 2016-12-14 MED FILL — ROSUVASTATIN CALCIUM 40 MG: 40 | 30 days supply | Qty: 30 | Fill #1

## 2016-12-14 MED FILL — TRUEplus LANCETS 28G MISC: 30 days supply | Qty: 100 | Fill #1

## 2016-12-14 MED FILL — METOPROLOL TARTRATE 50 MG T: 50 | 30 days supply | Qty: 60 | Fill #1

## 2016-12-14 MED FILL — HYDROCHLOROTHIAZIDE 25 MG T: 25 | 30 days supply | Qty: 30 | Fill #1

## 2016-12-14 MED FILL — TAMSULOSIN HCL 0.4 MG CAP: 0.4 | 30 days supply | Qty: 30 | Fill #1

## 2016-12-14 MED FILL — METFORMIN HCL ER 500 MG TAB: 500 | 30 days supply | Qty: 120 | Fill #1

## 2016-12-14 MED FILL — FLUTICASONE PROP 50 MCG SPR: 50 | 30 days supply | Qty: 16 | Fill #1

## 2016-12-14 MED FILL — **JARDIANCE 10 MG TABLET: 10 | 14 days supply | Qty: 14 | Fill #1

## 2016-12-14 MED FILL — CLOPIDOGREL 75 MG TABLET: 75 | 30 days supply | Qty: 30 | Fill #1

## 2016-12-14 MED FILL — $LANTUS 100 UNITS/ML VIAL: 100 | 10 days supply | Qty: 10 | Fill #3

## 2016-12-15 MED FILL — !NOVOLOG FLEXPEN SYRINGE 1: 100/ML | 12 days supply | Qty: 6 | Fill #5

## 2016-12-22 MED FILL — POTASSIUM CL ER 10 MEQ CAP: 10 | 15 days supply | Qty: 15 | Fill #4

## 2016-12-22 MED FILL — GABAPENTIN 300 MG CAPSULE: 300 | 20 days supply | Qty: 180 | Fill #3

## 2016-12-23 MED FILL — AMLODIPINE BESYLATE 5 MG TA: 5 | 30 days supply | Qty: 30 | Fill #5

## 2016-12-23 MED FILL — FAMOTIDINE 20 MG TABLET: 20 | 30 days supply | Qty: 60 | Fill #2

## 2016-12-23 MED FILL — ?FUROSEMIDE 40 MG TABLET: 40 | 28 days supply | Qty: 12 | Fill #3

## 2016-12-24 ENCOUNTER — Other Ambulatory Visit: Payer: Self-pay | Admitting: *Deleted

## 2016-12-24 MED ORDER — INSULIN GLARGINE 100 UNIT/ML ~~LOC~~ SOLN: 45.0000 [IU] | Freq: Two times a day (BID) | SUBCUTANEOUS | 3 refills | Status: DC

## 2016-12-24 MED FILL — $LANTUS 100 UNITS/ML VIAL: 100 | 33 days supply | Qty: 30 | Fill #0

## 2016-12-28 ENCOUNTER — Other Ambulatory Visit: Payer: Self-pay | Admitting: *Deleted

## 2016-12-28 MED ORDER — EZETIMIBE 10 MG PO TABS: 10.0000 mg | ORAL_TABLET | Freq: Every day | ORAL | 3 refills | Status: DC

## 2016-12-28 MED ORDER — EMPAGLIFLOZIN 10 MG PO TABS: 10.0000 mg | ORAL_TABLET | Freq: Every day | ORAL | 3 refills | Status: DC

## 2016-12-28 NOTE — Telephone Encounter (Signed)
PRINTED FOR PASS PROGRAM 

## 2017-01-05 ENCOUNTER — Other Ambulatory Visit: Payer: Self-pay | Admitting: *Deleted

## 2017-01-05 MED ORDER — INSULIN GLARGINE 300 UNIT/ML ~~LOC~~ SOPN: 45.0000 [IU] | PEN_INJECTOR | Freq: Two times a day (BID) | SUBCUTANEOUS | 3 refills | Status: DC

## 2017-01-05 NOTE — Telephone Encounter (Signed)
PRINTED FOR PASS PROGRAM 

## 2017-01-21 ENCOUNTER — Telehealth: Payer: Self-pay | Admitting: Family Medicine

## 2017-01-21 DIAGNOSIS — E1142 Type 2 diabetes mellitus with diabetic polyneuropathy: Secondary | ICD-10-CM

## 2017-01-21 MED ORDER — "INSULIN SYRINGE-NEEDLE U-100 31G X 5/16"" 0.5 ML MISC": 1.0000 | Freq: Three times a day (TID) | 3 refills | Status: DC

## 2017-01-21 MED ORDER — INSULIN ASPART 100 UNIT/ML ~~LOC~~ SOLN: 20.0000 [IU] | Freq: Three times a day (TID) | SUBCUTANEOUS | 2 refills | Status: DC

## 2017-01-21 NOTE — Telephone Encounter (Signed)
Received a call from the after hours nurse line Patient out of novolog x 1 week CBG 431 this PM He took 45 U of lantus this evening. He takes 45 U BID. [facility] [place]   Called to patient due to concern about cost of novolog and humalog Verified name and DOB He reports he has insurance now.  He request vial of novolog and is ok with humalog if cheaper He reports taking 20 U with meals   Sent in novolog vial to his preferred pharmacy, [facility] [address]  Sent in needles and syringes.  Patient has appointment on 02/23/17 with Dr. Laural BenesJohnson. He would like a sooner appointment if possible.

## 2017-01-22 ENCOUNTER — Other Ambulatory Visit: Payer: Self-pay | Admitting: Pharmacist

## 2017-01-22 DIAGNOSIS — E1142 Type 2 diabetes mellitus with diabetic polyneuropathy: Secondary | ICD-10-CM

## 2017-01-22 MED ORDER — INSULIN LISPRO 100 UNIT/ML ~~LOC~~ SOLN: 20.0000 [IU] | Freq: Three times a day (TID) | SUBCUTANEOUS | 2 refills | Status: DC

## 2017-01-22 MED ORDER — INSULIN LISPRO 100 UNIT/ML ~~LOC~~ SOLN
20.0000 [IU] | Freq: Three times a day (TID) | SUBCUTANEOUS | 2 refills | Status: DC
Start: 2017-01-22 — End: 2017-08-09

## 2017-01-26 ENCOUNTER — Telehealth: Payer: Self-pay | Admitting: Cardiology

## 2017-01-26 NOTE — Telephone Encounter (Signed)
Patient called today to change his sleep study date from Tuesday August 7 to Monday February 08 2017.  

## 2017-01-26 NOTE — Telephone Encounter (Signed)
Patient called today to change his sleep study date from Tuesday August 7 to Monday February 08 2017.

## 2017-01-26 NOTE — Telephone Encounter (Signed)
Patient calling in regards to upcoming sleep study.

## 2017-01-31 ENCOUNTER — Emergency Department (HOSPITAL_BASED_OUTPATIENT_CLINIC_OR_DEPARTMENT_OTHER)
Admission: EM | Admit: 2017-01-31 | Discharge: 2017-01-31 | Disposition: A | Discharge status: Home / Self Care | Payer: 59 | Attending: Emergency Medicine | Admitting: Emergency Medicine

## 2017-01-31 ENCOUNTER — Encounter (HOSPITAL_BASED_OUTPATIENT_CLINIC_OR_DEPARTMENT_OTHER): Payer: Self-pay | Admitting: Adult Health

## 2017-01-31 DIAGNOSIS — E079 Disorder of thyroid, unspecified: Secondary | ICD-10-CM | POA: Diagnosis not present

## 2017-01-31 DIAGNOSIS — E119 Type 2 diabetes mellitus without complications: Secondary | ICD-10-CM | POA: Insufficient documentation

## 2017-01-31 DIAGNOSIS — Z7984 Long term (current) use of oral hypoglycemic drugs: Secondary | ICD-10-CM | POA: Insufficient documentation

## 2017-01-31 DIAGNOSIS — K0889 Other specified disorders of teeth and supporting structures: Secondary | ICD-10-CM

## 2017-01-31 DIAGNOSIS — I252 Old myocardial infarction: Secondary | ICD-10-CM | POA: Diagnosis not present

## 2017-01-31 DIAGNOSIS — F17210 Nicotine dependence, cigarettes, uncomplicated: Secondary | ICD-10-CM | POA: Insufficient documentation

## 2017-01-31 DIAGNOSIS — I10 Essential (primary) hypertension: Secondary | ICD-10-CM | POA: Diagnosis not present

## 2017-01-31 DIAGNOSIS — I2510 Atherosclerotic heart disease of native coronary artery without angina pectoris: Secondary | ICD-10-CM | POA: Insufficient documentation

## 2017-01-31 DIAGNOSIS — Z79899 Other long term (current) drug therapy: Secondary | ICD-10-CM | POA: Insufficient documentation

## 2017-01-31 DIAGNOSIS — Z7982 Long term (current) use of aspirin: Secondary | ICD-10-CM | POA: Insufficient documentation

## 2017-01-31 DIAGNOSIS — Z7902 Long term (current) use of antithrombotics/antiplatelets: Secondary | ICD-10-CM | POA: Insufficient documentation

## 2017-01-31 NOTE — Discharge Instructions (Signed)
Call your dentist tomorrow to arrange a follow-up appointment.  Apply wax over the sharp edge to protect your tongue.

## 2017-01-31 NOTE — ED Triage Notes (Signed)
Presents with tongue pain and ulceration to left side of tongue that began after having teeth pulled in back left side. He is taking amoxicillin prophylactically post dental work.

## 2017-01-31 NOTE — ED Notes (Signed)
ED Provider at bedside. 

## 2017-01-31 NOTE — ED Provider Notes (Signed)
[facility] DEPT MHP Provider Note   CSN: [id number] Arrival date & time: 01/31/17  1401     History   Chief Complaint Chief Complaint  Patient presents with  . Dental Pain    HPI Marc Walker is a 46 y.o. male.  Patient is a 46 year old male presenting with complaints of dental pain. He recently had extraction of the left lower molars. He is complaining of a sharp edge poking through his gum that is causing pain and irritation to his tongue every time he chews, eats, or talks. He is requesting I remove this sharp piece.   The history is provided by the patient.  Dental Pain   This is a new problem. The current episode started 2 days ago. The problem occurs constantly. The problem has been rapidly worsening. The pain is moderate. He has tried nothing for the symptoms.    Past Medical History:  Diagnosis Date  . Coronary artery disease   . Diabetes mellitus without complication ([place])   . Heartburn   . High cholesterol   . Hyperlipidemia associated with type 2 diabetes mellitus ([place])   . Hypertension   . MI (myocardial infarction) ([place]) 07/2009   [facility] in [place], [place] for last 2, 1st one in [place], [place]; total of 6 stents, last one 02/2014   . Morbid obesity ([place]) 07/06/2016  . OSA (obstructive sleep apnea) 07/06/2016   Moderate with AHI 21.8/hr by PSG 08/2012 now on CPAP  . Thyroid disease    past history    Patient Active Problem List   Diagnosis Date Noted  . Benign prostatic hyperplasia with urinary frequency 11/17/2016  . Left flank pain 07/10/2016  . Leukocytosis 07/10/2016  . SIRS (systemic inflammatory response syndrome) ([place]) 07/10/2016  . Elevated troponin   . Pleurisy   . OSA (obstructive sleep apnea) 07/06/2016  . Morbid obesity ([place]) 07/06/2016  . DM type 2 with diabetic peripheral neuropathy ([place]) 12/16/2015  . CAD S/P multiple PCI's 09/30/2015  . Tobacco abuse 09/30/2015  . Essential hypertension 09/16/2015  . Hyperlipidemia  associated with type 2 diabetes mellitus ([place])   . Hyperlipidemia   . Chest pain 09/15/2015  . MI (myocardial infarction) ([place]) 07/30/2009    Past Surgical History:  Procedure Laterality Date  . APPENDECTOMY    . CARDIAC CATHETERIZATION N/A 09/16/2015   Procedure: Left Heart Cath and Coronary Angiography;  Surgeon: Lorretta Harp, MD;  Location: [place] [facility];  Service: Cardiovascular;  Laterality: N/A;  . CARDIAC CATHETERIZATION N/A 09/16/2015   Procedure: Coronary Stent Intervention;  Surgeon: Lorretta Harp, MD;  Location: [facility];  Service: Cardiovascular;  Laterality: N/A;  . CORONARY STENT PLACEMENT         Home Medications    Prior to Admission medications   Medication Sig Start Date End Date Taking? Authorizing Provider  amLODipine (NORVASC) 5 MG tablet Take 1 tablet (5 mg total) by mouth daily. 11/17/16 02/15/17  Maren Reamer, MD  aspirin 81 MG tablet Take 1 tablet (81 mg total) by mouth daily. 11/17/16   Maren Reamer, MD  benzocaine (ORAJEL) 10 % mucosal gel Use as directed 1 application in the mouth or throat as needed for mouth pain. 10/01/16   Alfonse Spruce, FNP  blood glucose meter kit and supplies KIT Dispense based on patient and insurance preference. Use up to four times daily as directed. (FOR ICD-9 250.00, 250.01). 09/17/15   Mendel Corning, MD  buPROPion [facility]  SR) 150 MG 12 hr tablet Take 1 tablet (150 mg total) by mouth 2 (two) times daily. 11/17/16   Maren Reamer, MD  clopidogrel (PLAVIX) 75 MG tablet Take 1 tablet (75 mg total) by mouth daily. 11/17/16   Maren Reamer, MD  diphenoxylate-atropine (LOMOTIL) 2.5-0.025 MG tablet Take 1 tablet by mouth 4 (four) times daily as needed for diarrhea or loose stools. 11/26/16   Barnet Glasgow, NP  empagliflozin (JARDIANCE) 10 MG TABS tablet Take 10 mg by mouth daily. 12/28/16   Tresa Garter, MD  ezetimibe (ZETIA) 10 MG tablet Take 1 tablet (10 mg total) by mouth daily.  12/28/16 03/28/17  Tresa Garter, MD  famotidine (PEPCID) 20 MG tablet Take 1 tablet (20 mg total) by mouth 2 (two) times daily. 09/01/16   Langeland, Leda Quail, MD  fluticasone (FLONASE) 50 MCG/ACT nasal spray Place 2 sprays into both nostrils daily. 11/17/16   Maren Reamer, MD  furosemide (LASIX) 40 MG tablet Take 1 tablet (40 mg total) by mouth 3 (three) times a week. 05/25/16 08/23/16  Fay Records, MD  gabapentin (NEURONTIN) 300 MG capsule Take 3 capsules (900 mg total) by mouth 3 (three) times daily. 07/06/16   Lottie Mussel T, MD  glucose blood (TRUE METRIX BLOOD GLUCOSE TEST) test strip Use as instructed 11/17/16   Lottie Mussel T, MD  hydrochlorothiazide (HYDRODIURIL) 25 MG tablet Take 1 tablet (25 mg total) by mouth daily. 11/17/16   Maren Reamer, MD  hydrocortisone cream 1 % Apply to affected area 2 times daily 12/06/16   Kirichenko, Lahoma Rocker, PA-C  ibuprofen (ADVIL,MOTRIN) 800 MG tablet TAKE 1 TABLET BY MOUTH EVERY 8 HOURS AS NEEDED FOR MODERATE PAIN (TAKE WITH FOOD). 10/28/16   Maren Reamer, MD  Insulin Glargine (LANTUS SOLOSTAR) 100 UNIT/ML Solostar Pen Inject 45 Units into the skin 2 (two) times daily. 09/23/16   Langeland, Dawn T, MD  insulin glargine (LANTUS) 100 UNIT/ML injection Inject 0.45 mLs (45 Units total) into the skin 2 (two) times daily. 12/24/16   Tresa Garter, MD  insulin lispro (HUMALOG) 100 UNIT/ML injection Inject 0.2 mLs (20 Units total) into the skin 3 (three) times daily with meals. 01/22/17   Funches, Adriana Mccallum, MD  Insulin Pen Needle 31G X 8 MM MISC Use as directed 08/06/16   Lottie Mussel T, MD  Insulin Syringe-Needle U-100 (B-D INS SYRINGE 0.5CC/31GX5/16) 31G X 5/16" 0.5 ML MISC 1 each by Does not apply route 3 (three) times daily. 01/21/17   Boykin Nearing, MD  Insulin Syringes, Disposable, U-100 0.5 ML MISC Take insulin as directed   Diagnosis: Insulin dependent diabetes mellitus ICD10 250 11/04/15   Langeland, Dawn T, MD  lisinopril  (PRINIVIL,ZESTRIL) 10 MG tablet Take 1 tablet (10 mg total) by mouth 2 (two) times daily. 11/17/16   Maren Reamer, MD  loratadine (CLARITIN) 10 MG tablet Take 1 tablet (10 mg total) by mouth daily. 11/17/16   Langeland, Leda Quail, MD  metFORMIN (GLUCOPHAGE-XR) 500 MG 24 hr tablet TAKE 2 TABLETS BY MOUTH 2 TIMES DAILY WITH A MEAL. 11/17/16   Langeland, Dawn T, MD  metoprolol tartrate (LOPRESSOR) 50 MG tablet Take 1 tablet (50 mg total) by mouth 2 (two) times daily. 11/17/16 02/15/17  Maren Reamer, MD  nicotine (NICOTROL) 10 MG inhaler Inhale 1 cartridge (1 continuous puffing total) into the lungs as needed for smoking cessation. 09/30/15   Imogene Burn, PA-C  nicotine polacrilex (NICORETTE) 4 MG gum Take 1  each (4 mg total) by mouth as needed for smoking cessation. 07/21/16   Maren Reamer, MD  nitroGLYCERIN (NITROSTAT) 0.4 MG SL tablet Place 1 tablet (0.4 mg total) under the tongue every 5 (five) minutes as needed for chest pain. 09/17/15   Rai, Vernelle Emerald, MD  polyethylene glycol (MIRALAX) packet Take 17 g by mouth daily. 12/06/16   Kirichenko, Tatyana, PA-C  potassium chloride (K-DUR) 10 MEQ tablet Take 1 tablet (10 mEq total) by mouth daily. 09/17/16 12/16/16  Tresa Garter, MD  rosuvastatin (CRESTOR) 40 MG tablet Take 1 tablet (40 mg total) by mouth daily. 11/17/16 02/15/17  Maren Reamer, MD  tamsulosin (FLOMAX) 0.4 MG CAPS capsule Take 1 capsule (0.4 mg total) by mouth daily. 11/17/16   Maren Reamer, MD  TRUEPLUS LANCETS 28G MISC Use as directed 11/17/16   Maren Reamer, MD    Family History Family History  Problem Relation Age of Onset  . Adopted: Yes  . Diabetes Maternal Grandmother     Social History Social History  Substance Use Topics  . Smoking status: Current Every Day Smoker    Packs/day: 1.00    Years: 33.00    Types: Cigarettes  . Smokeless tobacco: Never Used  . Alcohol use No     Allergies   Other and Levofloxacin   Review of Systems Review  of Systems  All other systems reviewed and are negative.    Physical Exam Updated Vital Signs BP 96/63   Pulse 71   Temp 98.2 F (36.8 C)   Resp 20   SpO2 100%   Physical Exam  Constitutional: He is oriented to person, place, and time. He appears well-developed and well-nourished. No distress.  HENT:  Head: Normocephalic and atraumatic.  The left rear molars are status post extraction. There is a sharp edge, potentially of jaw bone poking through the gum surface. There is no surrounding redness or erythema.  Neurological: He is alert and oriented to person, place, and time.  Skin: Skin is warm and dry. He is not diaphoretic.  Nursing note and vitals reviewed.    ED Treatments / Results  Labs (all labs ordered are listed, but only abnormal results are displayed) Labs Reviewed - No data to display  EKG  EKG Interpretation None       Radiology No results found.  Procedures Procedures (including critical care time)  Medications Ordered in ED Medications - No data to display   Initial Impression / Assessment and Plan / ED Course  I have reviewed the triage vital signs and the nursing notes.  Pertinent labs & imaging results that were available during my care of the patient were reviewed by me and considered in my medical decision making (see chart for details).  There is a sharp edge of jaw bone or possibly tooth fragment protruding through the gum to the left lower molars. It is firmly in place and I am unable to grasp this with forceps or needle driver. I have advised the patient to follow-up with his dentist tomorrow.  Final Clinical Impressions(s) / ED Diagnoses   Final diagnoses:  Pain, dental    New Prescriptions New Prescriptions   No medications on file     Veryl Speak, MD 01/31/17 1524

## 2017-02-02 ENCOUNTER — Encounter (HOSPITAL_BASED_OUTPATIENT_CLINIC_OR_DEPARTMENT_OTHER): Payer: Self-pay

## 2017-02-05 MED FILL — BUPROPION SR 150 MG TABLET: 150 | 30 days supply | Qty: 60 | Fill #2

## 2017-02-05 MED FILL — METFORMIN HCL ER 500 MG TAB: 500 | 30 days supply | Qty: 120 | Fill #2

## 2017-02-05 MED FILL — POTASSIUM CL ER 10 MEQ CAP: 10 | 15 days supply | Qty: 15 | Fill #5

## 2017-02-05 MED FILL — LISINOPRIL 10 MG TABLET: 10 | 30 days supply | Qty: 60 | Fill #2

## 2017-02-05 MED FILL — HYDROCHLOROTHIAZIDE 25 MG T: 25 | 30 days supply | Qty: 30 | Fill #2

## 2017-02-05 MED FILL — AMLODIPINE BESYLATE 5 MG TA: 5 | 30 days supply | Qty: 30 | Fill #6

## 2017-02-05 MED FILL — METOPROLOL TARTRATE 50 MG T: 50 | 30 days supply | Qty: 60 | Fill #2

## 2017-02-05 MED FILL — ?CLOPIDOGREL 75 MG TABLET: 75 | 30 days supply | Qty: 30 | Fill #2

## 2017-02-05 MED FILL — FLUTICASONE PROP 50 MCG SPR: 50 | 30 days supply | Qty: 16 | Fill #2

## 2017-02-05 MED FILL — FAMOTIDINE 20 MG TABLET: 20 | 30 days supply | Qty: 60 | Fill #3

## 2017-02-05 MED FILL — TRUEplus LANCETS 28G MISC: 30 days supply | Qty: 100 | Fill #2

## 2017-02-05 MED FILL — ROSUVASTATIN CALCIUM 40 MG: 40 | 30 days supply | Qty: 30 | Fill #2

## 2017-02-05 MED FILL — TAMSULOSIN HCL 0.4 MG CAP: 0.4 | 30 days supply | Qty: 30 | Fill #2

## 2017-02-06 ENCOUNTER — Other Ambulatory Visit: Payer: Self-pay | Admitting: Internal Medicine

## 2017-02-08 ENCOUNTER — Other Ambulatory Visit: Payer: Self-pay | Admitting: Internal Medicine

## 2017-02-08 ENCOUNTER — Telehealth: Payer: Self-pay | Admitting: *Deleted

## 2017-02-08 ENCOUNTER — Encounter (HOSPITAL_BASED_OUTPATIENT_CLINIC_OR_DEPARTMENT_OTHER): Payer: Self-pay

## 2017-02-08 MED ORDER — POTASSIUM CHLORIDE ER 10 MEQ PO TBCR: 10.0000 meq | EXTENDED_RELEASE_TABLET | Freq: Every day | ORAL | 2 refills | Status: DC

## 2017-02-08 MED FILL — ?FUROSEMIDE 40 MG TABLET: 40 | 30 days supply | Qty: 15 | Fill #1

## 2017-02-08 NOTE — Telephone Encounter (Signed)
Reached out to patient LMTCB 3:24P

## 2017-02-08 NOTE — Telephone Encounter (Signed)
-----   [facility] Hall sent at 02/08/2017  1:47 PM EDT ----- Regarding: Cyndra NumbersNIGHT Nina,   Please rescheduled pt.  His sleep study is tonight.   Pt didn't have insurance listed before.  Now pt has [employer].  Auth not in place yet.    Thanks  Charmaine

## 2017-02-09 ENCOUNTER — Other Ambulatory Visit: Payer: Self-pay

## 2017-02-09 MED ORDER — FUROSEMIDE 40 MG PO TABS: 40.0000 mg | ORAL_TABLET | ORAL | 8 refills | Status: DC

## 2017-02-09 MED ORDER — INSULIN GLARGINE 100 UNIT/ML SOLOSTAR PEN
45.0000 [IU] | PEN_INJECTOR | Freq: Two times a day (BID) | SUBCUTANEOUS | 3 refills | Status: DC
Start: 2017-02-09 — End: 2017-04-14

## 2017-02-09 MED FILL — HUMALOG 100 UNITS/ML KWIKPE: 100 | 25 days supply | Qty: 15 | Fill #0

## 2017-02-09 MED FILL — IBUPROFEN 800 MG TABLET: 800 | 4 days supply | Qty: 15 | Fill #0

## 2017-02-09 MED FILL — AMOXICILLIN 500 MG CAPSULE: 500 | 8 days supply | Qty: 25 | Fill #0

## 2017-02-09 MED FILL — LANTUS SOLOSTAR 100 UNITS/M: 100 | 28 days supply | Qty: 27 | Fill #0

## 2017-02-09 NOTE — Telephone Encounter (Signed)
Pt's medication was sent to his pharmacy as requested. Confirmation received.  

## 2017-02-11 ENCOUNTER — Encounter: Payer: Self-pay | Admitting: Pharmacist

## 2017-02-11 NOTE — Progress Notes (Signed)
Received prior authorization for Jardiance from [employer]. Started to completed PA and it returned as drug is covered by current benefit plan. No further PA activity needed.

## 2017-02-11 NOTE — Telephone Encounter (Signed)
Patient got the message in time that his sleep study had been cancelled because of insurance. Patient has a new date for his sleep study 04/05/17. Patient is aware agrees to treatment.

## 2017-02-15 ENCOUNTER — Ambulatory Visit: Payer: Self-pay | Admitting: Internal Medicine

## 2017-02-23 ENCOUNTER — Encounter: Payer: Self-pay | Admitting: Internal Medicine

## 2017-02-23 ENCOUNTER — Ambulatory Visit: Payer: Self-pay | Admitting: Internal Medicine

## 2017-02-26 ENCOUNTER — Encounter (HOSPITAL_BASED_OUTPATIENT_CLINIC_OR_DEPARTMENT_OTHER): Payer: Self-pay

## 2017-02-26 ENCOUNTER — Emergency Department (HOSPITAL_BASED_OUTPATIENT_CLINIC_OR_DEPARTMENT_OTHER)
Admission: EM | Admit: 2017-02-26 | Discharge: 2017-02-26 | Disposition: A | Discharge status: Home / Self Care | Payer: 59 | Attending: Emergency Medicine | Admitting: Emergency Medicine

## 2017-02-26 DIAGNOSIS — E119 Type 2 diabetes mellitus without complications: Secondary | ICD-10-CM | POA: Insufficient documentation

## 2017-02-26 DIAGNOSIS — Z7982 Long term (current) use of aspirin: Secondary | ICD-10-CM | POA: Diagnosis not present

## 2017-02-26 DIAGNOSIS — Z7902 Long term (current) use of antithrombotics/antiplatelets: Secondary | ICD-10-CM | POA: Diagnosis not present

## 2017-02-26 DIAGNOSIS — Z79899 Other long term (current) drug therapy: Secondary | ICD-10-CM | POA: Diagnosis not present

## 2017-02-26 DIAGNOSIS — J029 Acute pharyngitis, unspecified: Secondary | ICD-10-CM

## 2017-02-26 DIAGNOSIS — Z794 Long term (current) use of insulin: Secondary | ICD-10-CM | POA: Insufficient documentation

## 2017-02-26 DIAGNOSIS — Z955 Presence of coronary angioplasty implant and graft: Secondary | ICD-10-CM | POA: Diagnosis not present

## 2017-02-26 DIAGNOSIS — I2510 Atherosclerotic heart disease of native coronary artery without angina pectoris: Secondary | ICD-10-CM | POA: Insufficient documentation

## 2017-02-26 DIAGNOSIS — Z87891 Personal history of nicotine dependence: Secondary | ICD-10-CM | POA: Diagnosis not present

## 2017-02-26 DIAGNOSIS — I10 Essential (primary) hypertension: Secondary | ICD-10-CM | POA: Diagnosis not present

## 2017-02-26 DIAGNOSIS — I252 Old myocardial infarction: Secondary | ICD-10-CM | POA: Diagnosis not present

## 2017-02-26 LAB — RAPID STREP SCREEN (MED CTR MEBANE ONLY): STREPTOCOCCUS, GROUP A SCREEN (DIRECT): NEGATIVE

## 2017-02-26 NOTE — ED Triage Notes (Signed)
C/o sore throat x 2 weeks-NAD-steady gait 

## 2017-02-26 NOTE — Discharge Instructions (Signed)
As discussed, your rapid strep test was negative today.You will receive a call if the culture comes back positive and if you need antibiotics. No need for antibiotics at this time. Please make sure you stay well hydrated. Use tea with honey, cough drops, throat sprays to help sooth your throat.  Follow-up with your primary care provider, dentist and oral surgeon at your upcoming appointments.  Take ibuprofen and Tylenol for pain. Apply ice to your jaw as needed. Return if you experience any facial swelling, increased pain, difficulty swallowing or breathing or any other new concerning symptoms in the meantime.

## 2017-02-27 NOTE — ED Provider Notes (Signed)
[facility] Provider Note   CSN: [id number] Arrival date & time: 02/26/17  2040     History   Chief Complaint Chief Complaint  Patient presents with  . Sore Throat    HPI Marc Walker is a 46 y.o. male presenting with gradual onset sore throat for the last 3-4 weeks. He explains that he feels like it's been getting worse over the last 2 days. He has had a tooth extraction 4 weeks ago and has seen the oral surgeon due to continued left-sided jaw pain. He reports that he still able to take in fluids and food. He has not tried anything for symptoms. Denies fever, chills, nausea, vomiting, no known ill contacts. He has seen the oral surgeon last week for follow-up and was told that everything was going well.   HPI  Past Medical History:  Diagnosis Date  . Coronary artery disease   . Diabetes mellitus without complication ([place])   . Heartburn   . High cholesterol   . Hyperlipidemia associated with type 2 diabetes mellitus ([place])   . Hypertension   . MI (myocardial infarction) ([place]) 07/2009   [facility] in [place], [place] for last 2, 1st one in [place], [place]; total of 6 stents, last one 02/2014   . Morbid obesity ([place]) 07/06/2016  . OSA (obstructive sleep apnea) 07/06/2016   Moderate with AHI 21.8/hr by PSG 08/2012 now on CPAP  . Thyroid disease    past history    Patient Active Problem List   Diagnosis Date Noted  . Benign prostatic hyperplasia with urinary frequency 11/17/2016  . Left flank pain 07/10/2016  . Leukocytosis 07/10/2016  . SIRS (systemic inflammatory response syndrome) ([place]) 07/10/2016  . Elevated troponin   . Pleurisy   . OSA (obstructive sleep apnea) 07/06/2016  . Morbid obesity ([place]) 07/06/2016  . DM type 2 with diabetic peripheral neuropathy ([place]) 12/16/2015  . CAD S/P multiple PCI's 09/30/2015  . Tobacco abuse 09/30/2015  . Essential hypertension 09/16/2015  . Hyperlipidemia associated with type 2 diabetes mellitus ([place])   .  Hyperlipidemia   . Chest pain 09/15/2015  . MI (myocardial infarction) ([place]) 07/30/2009    Past Surgical History:  Procedure Laterality Date  . APPENDECTOMY    . CARDIAC CATHETERIZATION N/A 09/16/2015   Procedure: Left Heart Cath and Coronary Angiography;  Surgeon: Lorretta Harp, MD;  Location: [facility];  Service: Cardiovascular;  Laterality: N/A;  . CARDIAC CATHETERIZATION N/A 09/16/2015   Procedure: Coronary Stent Intervention;  Surgeon: Lorretta Harp, MD;  Location: [facility];  Service: Cardiovascular;  Laterality: N/A;  . CORONARY STENT PLACEMENT         Home Medications    Prior to Admission medications   Medication Sig Start Date End Date Taking? Authorizing Provider  amLODipine (NORVASC) 5 MG tablet Take 1 tablet (5 mg total) by mouth daily. 11/17/16 02/15/17  Maren Reamer, MD  aspirin 81 MG tablet Take 1 tablet (81 mg total) by mouth daily. 11/17/16   Maren Reamer, MD  benzocaine (ORAJEL) 10 % mucosal gel Use as directed 1 application in the mouth or throat as needed for mouth pain. 10/01/16   Alfonse Spruce, FNP  blood glucose meter kit and supplies KIT Dispense based on patient and insurance preference. Use up to four times daily as directed. (FOR ICD-9 250.00, 250.01). 09/17/15   Rai, Ripudeep K, MD  buPROPion (WELLBUTRIN SR) 150 MG 12 hr tablet Take 1 tablet (  150 mg total) by mouth 2 (two) times daily. 11/17/16   Maren Reamer, MD  clopidogrel (PLAVIX) 75 MG tablet Take 1 tablet (75 mg total) by mouth daily. 11/17/16   Maren Reamer, MD  diphenoxylate-atropine (LOMOTIL) 2.5-0.025 MG tablet Take 1 tablet by mouth 4 (four) times daily as needed for diarrhea or loose stools. 11/26/16   Barnet Glasgow, NP  empagliflozin (JARDIANCE) 10 MG TABS tablet Take 10 mg by mouth daily. 12/28/16   Tresa Garter, MD  ezetimibe (ZETIA) 10 MG tablet Take 1 tablet (10 mg total) by mouth daily. 12/28/16 03/28/17  Tresa Garter, MD  famotidine  (PEPCID) 20 MG tablet Take 1 tablet (20 mg total) by mouth 2 (two) times daily. 09/01/16   Langeland, Leda Quail, MD  fluticasone (FLONASE) 50 MCG/ACT nasal spray Place 2 sprays into both nostrils daily. 11/17/16   Maren Reamer, MD  furosemide (LASIX) 40 MG tablet Take 1 tablet (40 mg total) by mouth 3 (three) times a week. 02/10/17 05/11/17  Fay Records, MD  gabapentin (NEURONTIN) 300 MG capsule Take 3 capsules (900 mg total) by mouth 3 (three) times daily. 07/06/16   Lottie Mussel T, MD  glucose blood (TRUE METRIX BLOOD GLUCOSE TEST) test strip Use as instructed 11/17/16   Lottie Mussel T, MD  hydrochlorothiazide (HYDRODIURIL) 25 MG tablet Take 1 tablet (25 mg total) by mouth daily. 11/17/16   Maren Reamer, MD  hydrocortisone cream 1 % Apply to affected area 2 times daily 12/06/16   Kirichenko, Lahoma Rocker, PA-C  ibuprofen (ADVIL,MOTRIN) 800 MG tablet TAKE 1 TABLET BY MOUTH EVERY 8 HOURS AS NEEDED FOR MODERATE PAIN (TAKE WITH FOOD). 10/28/16   Maren Reamer, MD  Insulin Glargine (LANTUS SOLOSTAR) 100 UNIT/ML Solostar Pen Inject 45 Units into the skin 2 (two) times daily. 02/09/17   Ladell Pier, MD  insulin glargine (LANTUS) 100 UNIT/ML injection Inject 0.45 mLs (45 Units total) into the skin 2 (two) times daily. 12/24/16   Tresa Garter, MD  insulin lispro (HUMALOG) 100 UNIT/ML injection Inject 0.2 mLs (20 Units total) into the skin 3 (three) times daily with meals. 01/22/17   Funches, Adriana Mccallum, MD  Insulin Pen Needle 31G X 8 MM MISC Use as directed 08/06/16   Lottie Mussel T, MD  Insulin Syringe-Needle U-100 (B-D INS SYRINGE 0.5CC/31GX5/16) 31G X 5/16" 0.5 ML MISC 1 each by Does not apply route 3 (three) times daily. 01/21/17   Boykin Nearing, MD  Insulin Syringes, Disposable, U-100 0.5 ML MISC Take insulin as directed   Diagnosis: Insulin dependent diabetes mellitus ICD10 250 11/04/15   Langeland, Dawn T, MD  lisinopril (PRINIVIL,ZESTRIL) 10 MG tablet Take 1 tablet (10 mg total) by  mouth 2 (two) times daily. 11/17/16   Maren Reamer, MD  loratadine (CLARITIN) 10 MG tablet Take 1 tablet (10 mg total) by mouth daily. 11/17/16   Langeland, Leda Quail, MD  metFORMIN (GLUCOPHAGE-XR) 500 MG 24 hr tablet TAKE 2 TABLETS BY MOUTH 2 TIMES DAILY WITH A MEAL. 11/17/16   Langeland, Dawn T, MD  metoprolol tartrate (LOPRESSOR) 50 MG tablet Take 1 tablet (50 mg total) by mouth 2 (two) times daily. 11/17/16 02/15/17  Maren Reamer, MD  nicotine (NICOTROL) 10 MG inhaler Inhale 1 cartridge (1 continuous puffing total) into the lungs as needed for smoking cessation. 09/30/15   Imogene Burn, PA-C  nicotine polacrilex (NICORETTE) 4 MG gum Take 1 each (4 mg total) by mouth as needed for  smoking cessation. 07/21/16   Maren Reamer, MD  nitroGLYCERIN (NITROSTAT) 0.4 MG SL tablet Place 1 tablet (0.4 mg total) under the tongue every 5 (five) minutes as needed for chest pain. 09/17/15   Rai, Vernelle Emerald, MD  polyethylene glycol (MIRALAX) packet Take 17 g by mouth daily. 12/06/16   Kirichenko, Tatyana, PA-C  potassium chloride (K-DUR) 10 MEQ tablet Take 1 tablet (10 mEq total) by mouth daily. 02/08/17 05/09/17  Ladell Pier, MD  rosuvastatin (CRESTOR) 40 MG tablet Take 1 tablet (40 mg total) by mouth daily. 11/17/16 02/15/17  Maren Reamer, MD  tamsulosin (FLOMAX) 0.4 MG CAPS capsule Take 1 capsule (0.4 mg total) by mouth daily. 11/17/16   Maren Reamer, MD  TRUEPLUS LANCETS 28G MISC Use as directed 11/17/16   Maren Reamer, MD    Family History Family History  Problem Relation Age of Onset  . Adopted: Yes  . Diabetes Maternal Grandmother     Social History Social History  Substance Use Topics  . Smoking status: Former Research scientist (life sciences)  . Smokeless tobacco: Never Used     Comment: quit 01/2017  . Alcohol use No     Allergies   Other and Levofloxacin   Review of Systems Review of Systems  Constitutional: Negative for chills and fever.  HENT: Positive for sore throat. Negative for  drooling, trouble swallowing and voice change.   Respiratory: Negative for cough, choking, chest tightness, shortness of breath, wheezing and stridor.   Cardiovascular: Negative for chest pain.  Gastrointestinal: Negative for nausea and vomiting.  Musculoskeletal: Negative for neck pain and neck stiffness.  Skin: Negative for color change, pallor and rash.  Neurological: Negative for dizziness, facial asymmetry, light-headedness and headaches.     Physical Exam Updated Vital Signs BP 110/73 (BP Location: Left Arm)   Pulse 80   Temp 97.9 F (36.6 C) (Oral)   Resp 18   Ht _0  (1.651 m)   Wt 131 kg (288 lb 12.8 oz)   SpO2 97%   BMI 48.06 kg/m   Physical Exam  Constitutional: He appears well-developed and well-nourished. No distress.  Afebrile, nontoxic-appearing, sitting comfortably in chair in no acute distress.  HENT:  Head: Normocephalic and atraumatic.  Mouth/Throat: Oropharynx is clear and moist. No oropharyngeal exudate.  No gross oral abscess. Uvula is midline, arches are simmetrical and intact. No peritonsilar swelling or exudate. No trismus. Sublingual mucosa is soft and non-tender. Tolerating oral secretions. No concern for ludwig's angina.   Eyes: Conjunctivae and EOM are normal.  Neck: Normal range of motion. Neck supple.  Cardiovascular: Normal rate, regular rhythm and normal heart sounds.   No murmur heard. Pulmonary/Chest: Effort normal and breath sounds normal. No respiratory distress. He has no wheezes.  Musculoskeletal: Normal range of motion. He exhibits no edema.  Lymphadenopathy:    He has cervical adenopathy.  Neurological: He is alert.  Skin: Skin is warm and dry. No rash noted. He is not diaphoretic. No erythema. No pallor.  Psychiatric: He has a normal mood and affect.  Nursing note and vitals reviewed.    ED Treatments / Results  Labs (all labs ordered are listed, but only abnormal results are displayed) Labs Reviewed  RAPID STREP SCREEN (NOT  AT [facility])  CULTURE, GROUP A STREP [facility])    EKG  EKG Interpretation None       Radiology No results found.  Procedures Procedures (including critical care time)  Medications Ordered in ED Medications - No data to  display   Initial Impression / Assessment and Plan / ED Course  I have reviewed the triage vital signs and the nursing notes.  Pertinent labs & imaging results that were available during my care of the patient were reviewed by me and considered in my medical decision making (see chart for details).    Pt afebrile without tonsillar exudate, negative strep. Presents with mild cervical lymphadenopathy, & dysphagia; diagnosis of viral pharyngitis.   No abx indicated. DC w symptomatic tx for pain  Pt does not appear dehydrated, but did discuss importance of water rehydration. Presentation non concerning for PTA or infxn spread to soft tissue.   No trismus or uvula deviation. Specific return precautions discussed. Pt able to drink water in ED without difficulty with intact airway. Recommended PCP follow up and upcoming dental f/u.  Discussed strict return precautions and advised to return to the emergency department if experiencing any new or worsening symptoms. Instructions were understood and patient agreed with discharge plan.  Final Clinical Impressions(s) / ED Diagnoses   Final diagnoses:  Pharyngitis, unspecified etiology    New Prescriptions Discharge Medication List as of 02/26/2017 11:46 PM       Emeline General, PA-C 02/27/17 0112    Tanna Furry, MD 03/13/17 2108

## 2017-03-01 ENCOUNTER — Encounter: Payer: Self-pay | Admitting: Internal Medicine

## 2017-03-01 LAB — CULTURE, GROUP A STREP (THRC)

## 2017-03-02 ENCOUNTER — Encounter: Payer: Self-pay | Admitting: Internal Medicine

## 2017-03-02 ENCOUNTER — Ambulatory Visit: Payer: Self-pay | Attending: Internal Medicine | Admitting: Internal Medicine

## 2017-03-02 VITALS — BP 108/66 | HR 105 | Temp 98.3°F | Resp 16 | Wt 297.8 lb

## 2017-03-02 DIAGNOSIS — E1142 Type 2 diabetes mellitus with diabetic polyneuropathy: Secondary | ICD-10-CM

## 2017-03-02 DIAGNOSIS — Z23 Encounter for immunization: Secondary | ICD-10-CM

## 2017-03-02 DIAGNOSIS — E13319 Other specified diabetes mellitus with unspecified diabetic retinopathy without macular edema: Secondary | ICD-10-CM

## 2017-03-02 DIAGNOSIS — Z79899 Other long term (current) drug therapy: Secondary | ICD-10-CM | POA: Insufficient documentation

## 2017-03-02 DIAGNOSIS — N529 Male erectile dysfunction, unspecified: Secondary | ICD-10-CM

## 2017-03-02 DIAGNOSIS — Z9889 Other specified postprocedural states: Secondary | ICD-10-CM | POA: Insufficient documentation

## 2017-03-02 DIAGNOSIS — Z87891 Personal history of nicotine dependence: Secondary | ICD-10-CM | POA: Insufficient documentation

## 2017-03-02 DIAGNOSIS — Z833 Family history of diabetes mellitus: Secondary | ICD-10-CM | POA: Insufficient documentation

## 2017-03-02 DIAGNOSIS — E11311 Type 2 diabetes mellitus with unspecified diabetic retinopathy with macular edema: Secondary | ICD-10-CM | POA: Insufficient documentation

## 2017-03-02 DIAGNOSIS — Z720 Tobacco use: Secondary | ICD-10-CM

## 2017-03-02 DIAGNOSIS — Z794 Long term (current) use of insulin: Secondary | ICD-10-CM | POA: Insufficient documentation

## 2017-03-02 DIAGNOSIS — Z7982 Long term (current) use of aspirin: Secondary | ICD-10-CM | POA: Insufficient documentation

## 2017-03-02 DIAGNOSIS — N401 Enlarged prostate with lower urinary tract symptoms: Secondary | ICD-10-CM | POA: Insufficient documentation

## 2017-03-02 DIAGNOSIS — I10 Essential (primary) hypertension: Secondary | ICD-10-CM

## 2017-03-02 DIAGNOSIS — G4733 Obstructive sleep apnea (adult) (pediatric): Secondary | ICD-10-CM

## 2017-03-02 HISTORY — DX: Male erectile dysfunction, unspecified: N52.9

## 2017-03-02 LAB — GLUCOSE, POCT (MANUAL RESULT ENTRY): POC GLUCOSE: 124 mg/dL — AB (ref 70–99)

## 2017-03-02 LAB — HM DIABETES EYE EXAM

## 2017-03-02 LAB — POCT GLYCOSYLATED HEMOGLOBIN (HGB A1C): Hemoglobin A1C: 7

## 2017-03-02 MED FILL — FUROSEMIDE 40 MG TABLET: 40 | 30 days supply | Qty: 15 | Fill #0

## 2017-03-02 MED FILL — ROSUVASTATIN CALCIUM 40 MG: 40 | 30 days supply | Qty: 30 | Fill #3

## 2017-03-02 MED FILL — ?JARDIANCE 10 MG TABLET: 10 MG | 25 days supply | Qty: 25 | Fill #2

## 2017-03-02 MED FILL — POTASSIUM CL 10 MEQ TAB SA: 10 | 30 days supply | Qty: 30 | Fill #0

## 2017-03-02 MED FILL — ?CLOPIDOGREL 75MG TAB: 75 | 30 days supply | Qty: 30 | Fill #3

## 2017-03-02 MED FILL — TRUEplus LANCETS 28G MISC: 30 days supply | Qty: 100 | Fill #3

## 2017-03-02 MED FILL — METOPROLOL TARTRATE 50 MG T: 50 | 30 days supply | Qty: 60 | Fill #3

## 2017-03-02 MED FILL — HYDROCHLOROTHIAZIDE 25 MG T: 25 | 30 days supply | Qty: 30 | Fill #3

## 2017-03-02 MED FILL — LISINOPRIL 10 MG TABLET: 10 | 30 days supply | Qty: 60 | Fill #3

## 2017-03-02 MED FILL — !HUMALOG 100 UNITS/ML KWIKP: 100 | 25 days supply | Qty: 15 | Fill #1

## 2017-03-02 MED FILL — AMLODIPINE BESYLATE 5 MG TA: 5 | 30 days supply | Qty: 30 | Fill #7

## 2017-03-02 MED FILL — !LANTUS SOLOSTAR 100UNITS/M: 100 | 28 days supply | Qty: 27 | Fill #1

## 2017-03-02 NOTE — Patient Instructions (Addendum)
Please call the medical supply store that you use and inquire the type of mask that you had for your CPAP machine. Once I have this information, I can write a prescription for it.   Congratulations on quitting the cigarettes.  Continue to remain tobacco free.  Follow a Healthy Eating Plan - You can do it! Limit sugary drinks.  Avoid sodas, sweet tea, sport or energy drinks, or fruit drinks.  Drink water, lo-fat milk, or diet drinks. Limit snack foods.   Cut back on candy, cake, cookies, chips, ice cream.  These are a special treat, only in small amounts. Eat plenty of vegetables.  Especially dark green, red, and orange vegetables. Aim for at least 3 servings a day. More is better! Include fruit in your daily diet.  Whole fruit is much healthier than fruit juice! Limit "white" bread, "white" pasta, "white" rice.   Choose "100% whole grain" products, brown or wild rice. Avoid fatty meats. Try "Meatless Monday" and choose eggs or beans one day a week.  When eating meat, choose lean meats like chicken, [place], and fish.  Grill, broil, or bake meats instead of frying, and eat poultry without the skin. Eat less salt.  Avoid frozen pizzas, frozen dinners and salty foods.  Use seasonings other than salt in cooking.  This can help blood pressure and keep you from swelling Beer, wine and liquor have calories.  If you can safely drink alcohol, limit to 1 drink per day for women, 2 drinks for men Influenza Virus Vaccine injection (Fluarix) What is this medicine? INFLUENZA VIRUS VACCINE (in floo EN zuh VAHY ruhs vak SEEN) helps to reduce the risk of getting influenza also known as the flu. This medicine may be used for other purposes; ask your health care provider or pharmacist if you have questions. COMMON BRAND NAME(S): Fluarix, Fluzone What should I tell my health care provider before I take this medicine? They need to know if you have any of these conditions: -bleeding disorder like hemophilia -fever or  infection -Guillain-Barre syndrome or other neurological problems -immune system problems -infection with the human immunodeficiency virus (HIV) or AIDS -low blood platelet counts -multiple sclerosis -an unusual or allergic reaction to influenza virus vaccine, eggs, chicken proteins, latex, gentamicin, other medicines, foods, dyes or preservatives -pregnant or trying to get pregnant -breast-feeding How should I use this medicine? This vaccine is for injection into a muscle. It is given by a health care professional. A copy of Vaccine Information Statements will be given before each vaccination. Read this sheet carefully each time. The sheet may change frequently. Talk to your pediatrician regarding the use of this medicine in children. Special care may be needed. Overdosage: If you think you have taken too much of this medicine contact a poison control center or emergency room at once. NOTE: This medicine is only for you. Do not share this medicine with others. What if I miss a dose? This does not apply. What may interact with this medicine? -chemotherapy or radiation therapy -medicines that lower your immune system like etanercept, anakinra, infliximab, and adalimumab -medicines that treat or prevent blood clots like warfarin -phenytoin -steroid medicines like prednisone or cortisone -theophylline -vaccines This list may not describe all possible interactions. Give your health care provider a list of all the medicines, herbs, non-prescription drugs, or dietary supplements you use. Also tell them if you smoke, drink alcohol, or use illegal drugs. Some items may interact with your medicine. What should I watch for while using  this medicine? Report any side effects that do not go away within 3 days to your doctor or health care professional. Call your health care provider if any unusual symptoms occur within 6 weeks of receiving this vaccine. You may still catch the flu, but the illness is  not usually as bad. You cannot get the flu from the vaccine. The vaccine will not protect against colds or other illnesses that may cause fever. The vaccine is needed every year. What side effects may I notice from receiving this medicine? Side effects that you should report to your doctor or health care professional as soon as possible: -allergic reactions like skin rash, itching or hives, swelling of the face, lips, or tongue Side effects that usually do not require medical attention (report to your doctor or health care professional if they continue or are bothersome): -fever -headache -muscle aches and pains -pain, tenderness, redness, or swelling at site where injected -weak or tired This list may not describe all possible side effects. Call your doctor for medical advice about side effects. You may report side effects to FDA at 1-800-FDA-1088. Where should I keep my medicine? This vaccine is only given in a clinic, pharmacy, doctor's office, or other health care setting and will not be stored at home. NOTE: This sheet is a summary. It may not cover all possible information. If you have questions about this medicine, talk to your doctor, pharmacist, or health care provider.  2018 Elsevier/Gold Standard (2008-01-11 09:30:40)

## 2017-03-02 NOTE — Progress Notes (Signed)
Patient ID: Marc Walker, male    DOB: [birth year]  MRN: [id number]  CC: re-establish; Diabetes; and Hypertension   Subjective: Marc Walker is a 46 y.o. male who presents for chronic ds management. Last saw Dr. Janne Napoleon 10/2016 His concerns today include:  46 year old male with history of CAD (8 stents), DM type II, HL, HTN, obesity, OSA on CPAP, tobacco dependence, BPH  1. Seen in ER a few times with mouth and throat pain post extraction tooth LT lower jaw -told he has bone fragments by oral surgeon  2. DM:  -checks BS 4-5 x a day. Highest has been 400 Usual range 92-160s, except bedtime in 200s. Keeps a log on his cell phone through an app which she shared with me today Meds: taking Metformin, Lantus 45 BID, Humalog20 TID with meals.  Insurance will not pay for Jardiance -"I still like to eat. I try not to but, I enjoy the texture of foods." Goes back for 4th and 5th. Loves to drink milk, Coke Zero and sweet tea -does some walking. Just joined gym. Plans to use the pool Saw eye doc at [facility] today. Has ophthalmologic manifestations of DM per letter that was sent with him for my records.  Letter does not state the degree of retinopathy and which eye -new glasses 4 mths old -appt with podiatry next wk  3. Tob abuse: -free of cigarettes x 1 mth.  Looks forward to telling his cardiologist next wk  HTNCAD: compliant with meds including Plavix, Lopressor, Crestor and Zetia -no CP -+ LE edema. On Furosemide 3 x a wk and HCTZ daily. Limits salt  4. OSA: mask broke. Has sleep study scheduled for 04/05/2017  5. BPH: "Dr. Clide Dales misunderstood me."  Does not feel he needs Flomax. Denies nocturia or problems with urine flow. No leakage or incontinence.  States his problem is at his penis is very small and has problems getting it out when time to urinate Also endorses  problems getting erection x 2 yrs.  Last use of SL Nitro 2-4 yrs ago Patient Active Problem List   Diagnosis Date Noted  . Benign prostatic hyperplasia with urinary frequency 11/17/2016  . OSA on CPAP 07/06/2016  . Morbid obesity ([place]) 07/06/2016  . DM type 2 with diabetic peripheral neuropathy ([place]) 12/16/2015  . CAD S/P multiple PCI's 09/30/2015  . Tobacco abuse 09/30/2015  . Essential hypertension 09/16/2015  . Hyperlipidemia   . MI (myocardial infarction) ([place]) 07/30/2009     Current Outpatient Prescriptions on File Prior to Visit  Medication Sig Dispense Refill  . amLODipine (NORVASC) 5 MG tablet Take 1 tablet (5 mg total) by mouth daily. 30 tablet 6  . aspirin 81 MG tablet Take 1 tablet (81 mg total) by mouth daily. 90 tablet 3  . benzocaine (ORAJEL) 10 % mucosal gel Use as directed 1 application in the mouth or throat as needed for mouth pain. 5.3 g 0  . blood glucose meter kit and supplies KIT Dispense based on patient and insurance preference. Use up to four times daily as directed. (FOR ICD-9 250.00, 250.01). 1 each 0  . buPROPion (WELLBUTRIN SR) 150 MG 12 hr tablet Take 1 tablet (150 mg total) by mouth 2 (two) times daily. 60 tablet 2  . clopidogrel (PLAVIX) 75 MG tablet Take 1 tablet (75 mg total) by mouth daily. 30 tablet 10  . diphenoxylate-atropine (LOMOTIL) 2.5-0.025 MG tablet Take 1 tablet by mouth 4 (four) times daily as needed for diarrhea  or loose stools. 30 tablet 0  . ezetimibe (ZETIA) 10 MG tablet Take 1 tablet (10 mg total) by mouth daily. 90 tablet 3  . famotidine (PEPCID) 20 MG tablet Take 1 tablet (20 mg total) by mouth 2 (two) times daily. 60 tablet 3  . fluticasone (FLONASE) 50 MCG/ACT nasal spray Place 2 sprays into both nostrils daily. 16 g 6  . furosemide (LASIX) 40 MG tablet Take 1 tablet (40 mg total) by mouth 3 (three) times a week. 15 tablet 8  . gabapentin (NEURONTIN) 300 MG capsule Take 3 capsules (900 mg total) by mouth 3 (three) times daily. 180 capsule 3  . glucose blood (TRUE METRIX BLOOD GLUCOSE TEST) test strip Use as instructed 100 each 12  .  hydrochlorothiazide (HYDRODIURIL) 25 MG tablet Take 1 tablet (25 mg total) by mouth daily. 90 tablet 3  . hydrocortisone cream 1 % Apply to affected area 2 times daily 15 g 0  . ibuprofen (ADVIL,MOTRIN) 800 MG tablet TAKE 1 TABLET BY MOUTH EVERY 8 HOURS AS NEEDED FOR MODERATE PAIN (TAKE WITH FOOD). 40 tablet 0  . Insulin Glargine (LANTUS SOLOSTAR) 100 UNIT/ML Solostar Pen Inject 45 Units into the skin 2 (two) times daily. 30 mL 3  . insulin glargine (LANTUS) 100 UNIT/ML injection Inject 0.45 mLs (45 Units total) into the skin 2 (two) times daily. 10 mL 3  . insulin lispro (HUMALOG) 100 UNIT/ML injection Inject 0.2 mLs (20 Units total) into the skin 3 (three) times daily with meals. 20 mL 2  . Insulin Pen Needle 31G X 8 MM MISC Use as directed 100 each 5  . Insulin Syringe-Needle U-100 (B-D INS SYRINGE 0.5CC/31GX5/16) 31G X 5/16" 0.5 ML MISC 1 each by Does not apply route 3 (three) times daily. 90 each 3  . Insulin Syringes, Disposable, U-100 0.5 ML MISC Take insulin as directed   Diagnosis: Insulin dependent diabetes mellitus ICD10 250 1 each 0  . lisinopril (PRINIVIL,ZESTRIL) 10 MG tablet Take 1 tablet (10 mg total) by mouth 2 (two) times daily. 60 tablet 3  . loratadine (CLARITIN) 10 MG tablet Take 1 tablet (10 mg total) by mouth daily. 30 tablet 11  . metFORMIN (GLUCOPHAGE-XR) 500 MG 24 hr tablet TAKE 2 TABLETS BY MOUTH 2 TIMES DAILY WITH A MEAL. 120 tablet 2  . metoprolol tartrate (LOPRESSOR) 50 MG tablet Take 1 tablet (50 mg total) by mouth 2 (two) times daily. 90 tablet 11  . nicotine (NICOTROL) 10 MG inhaler Inhale 1 cartridge (1 continuous puffing total) into the lungs as needed for smoking cessation. 42 each 0  . nicotine polacrilex (NICORETTE) 4 MG gum Take 1 each (4 mg total) by mouth as needed for smoking cessation. 100 tablet 3  . nitroGLYCERIN (NITROSTAT) 0.4 MG SL tablet Place 1 tablet (0.4 mg total) under the tongue every 5 (five) minutes as needed for chest pain. 30 tablet 12  .  polyethylene glycol (MIRALAX) packet Take 17 g by mouth daily. 14 each 0  . potassium chloride (K-DUR) 10 MEQ tablet Take 1 tablet (10 mEq total) by mouth daily. 30 tablet 2  . rosuvastatin (CRESTOR) 40 MG tablet Take 1 tablet (40 mg total) by mouth daily. 90 tablet 3  . TRUEPLUS LANCETS 28G MISC Use as directed 100 each 12   No current facility-administered medications on file prior to visit.     Allergies  Allergen Reactions  . Other     Pt is a recovering drug addict for 24 years 15  months.  Pt only wants pain medicine if absolutely necessary.  . Levofloxacin Rash    Rash on back (not sun exposed)and hypersensitivity to skin on exposed skin/arm    Social History   Social History  . Marital status: Divorced    Spouse name: N/A  . Number of children: N/A  . Years of education: N/A   Occupational History  . Biochemist, clinical    Social History Main Topics  . Smoking status: Former Research scientist (life sciences)  . Smokeless tobacco: Never Used     Comment: quit 01/2017  . Alcohol use No  . Drug use: No  . Sexual activity: Not on file   Other Topics Concern  . Not on file   Social History Narrative   Adopted. Very little info on biological parents, ?diabetes.   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 Adopted: Yes  . Diabetes Maternal Grandmother     Past Surgical History:  Procedure Laterality Date  . APPENDECTOMY    . CARDIAC CATHETERIZATION N/A 09/16/2015   Procedure: Left Heart Cath and Coronary Angiography;  Surgeon: Lorretta Harp, MD;  Location: [facility];  Service: Cardiovascular;  Laterality: N/A;  . CARDIAC CATHETERIZATION N/A 09/16/2015   Procedure: Coronary Stent Intervention;  Surgeon: Lorretta Harp, MD;  Location: [facility];  Service: Cardiovascular;  Laterality: N/A;  . CORONARY STENT PLACEMENT      ROS: Review of Systems Neg except as stated above  PHYSICAL EXAM: BP 108/66   Pulse (!) 105   Temp 98.3  F (36.8 C) (Oral)   Resp 16   Wt 297 lb 12.8 oz (135.1 kg)   SpO2 96%   BMI 49.56 kg/m   Wt Readings from Last 3 Encounters:  03/02/17 297 lb 12.8 oz (135.1 kg)  02/26/17 288 lb 12.8 oz (131 kg)  12/06/16 290 lb (131.5 kg)   Physical Exam  General appearance - alert, morbidly obese caucasian male and in no distress Mental status - normal mood, behavior, speech, dress, motor activity, and thought processes Mouth -gum in LT lower jaw appears to be healing nicely Neck - supple, no significant adenopathy Chest - clear to auscultation, no wheezes, rales or rhonchi, symmetric air entry Heart - normal rate, regular rhythm, normal S1, S2, no murmurs, rubs, clicks or gallops Extremities - no LE edema GU: declined exam  Results for orders placed or performed in visit on 03/02/17  POCT glucose (manual entry)  Result Value Ref Range   POC Glucose 124 (A) 70 - 99 mg/dl  POCT glycosylated hemoglobin (Hb A1C)  Result Value Ref Range   Hemoglobin A1C 7.0    Depression screen [facility] 2/9 03/02/2017 11/17/2016 10/01/2016 09/23/2016 09/01/2016  Decreased Interest 0 0 0 0 0  Down, Depressed, Hopeless 0 0 0 0 0  PHQ - 2 Score 0 0 0 0 0  Altered sleeping - 3 2 - -  Tired, decreased energy - 3 2 - -  Change in appetite - 1 2 - -  Feeling bad or failure about yourself  - 0 0 - -  Trouble concentrating - 2 0 - -  Moving slowly or fidgety/restless - 1 0 - -  Suicidal thoughts - 0 0 - -  PHQ-9 Score - 10 6 - -  Difficult doing work/chores - - - - -   GAD 7 : Generalized Anxiety Score 03/02/2017 11/17/2016 10/01/2016 09/23/2016  Nervous,  Anxious, on Edge 0 0 0 0  Control/stop worrying 0 0 0 0  Worry too much - different things 0 0 0 0  Trouble relaxing _0 Restless 0 1 0 0  Easily annoyed or irritable 0 0 0 0  Afraid - awful might happen 0 0 0 0  Total GAD 7 Score _1 Anxiety Difficulty - - - -      ASSESSMENT AND PLAN: 1. DM type 2 with diabetic peripheral neuropathy (HCC) -A1C and BS  improved Discussed the importance of healthy eating habits, regular  exercise ( as tolerated) and medication compliance to achieve or maintain control of diabetes. -Jardiance removed from med list since he never was able to fill and A1C improving without it - POCT glucose (manual entry) - POCT glycosylated hemoglobin (Hb A1C)  2. Diabetic retinopathy associated with diabetes mellitus of other type, macular edema presence unspecified, unspecified laterality, unspecified retinopathy severity ([place]) Note rec from Dr. Laban Emperor, OD from [place]  3. Essential hypertension -at goal.  Continue current meds and DASH  4. Tobacco abuse -congratulated him on quitting.  He aims to remain tob free  5. OSA (obstructive sleep apnea) -needs rxn for new mask until new sleep study next mth. Advise him to call his medical supplier and inquire the type of mask he had then send me a Mychart message. I can then generate new rxn  6. Morbid obesity ([place]) -spent some time doing motivational interviewing and dietary counseling. He will try to avoid going back multiple times for refill when he eats -plans to start water aerobics at gym  7. Need for influenza vaccination - Flu Vaccine QUAD 6+ mos PF IM (Fluarix Quad PF)  8. Erectile dysfunction, unspecified erectile dysfunction type -BPH and Flomax taken off problem list and med list.   Discussed putting him on Viagra but I would like for him to get ok first from cardiologist since SL Nitro on med list and he does carry it around with him in pocket. Though he reports he has not used in 2-4 yrs.  Patient was given the opportunity to ask questions.  Patient verbalized understanding of the plan and was able to repeat key elements of the plan.   Orders Placed This Encounter  Procedures  . Flu Vaccine QUAD 6+ mos PF IM (Fluarix Quad PF)  . POCT glucose (manual entry)  . POCT glycosylated hemoglobin (Hb A1C)     Requested Prescriptions    No  prescriptions requested or ordered in this encounter    Return in about 3 months (around 06/01/2017).  Karle Plumber, MD, FACP

## 2017-03-09 ENCOUNTER — Ambulatory Visit (INDEPENDENT_AMBULATORY_CARE_PROVIDER_SITE_OTHER): Payer: Self-pay | Admitting: Physician Assistant

## 2017-03-09 ENCOUNTER — Encounter: Payer: Self-pay | Admitting: Sports Medicine

## 2017-03-09 ENCOUNTER — Ambulatory Visit (INDEPENDENT_AMBULATORY_CARE_PROVIDER_SITE_OTHER): Payer: 59 | Admitting: Sports Medicine

## 2017-03-09 ENCOUNTER — Encounter: Payer: Self-pay | Admitting: Physician Assistant

## 2017-03-09 VITALS — BP 138/60 | HR 62 | Ht 65.0 in | Wt 293.8 lb

## 2017-03-09 VITALS — BP 103/55 | HR 69 | Resp 16 | Ht 65.5 in | Wt 293.0 lb

## 2017-03-09 DIAGNOSIS — M79609 Pain in unspecified limb: Secondary | ICD-10-CM

## 2017-03-09 DIAGNOSIS — I10 Essential (primary) hypertension: Secondary | ICD-10-CM

## 2017-03-09 DIAGNOSIS — Z720 Tobacco use: Secondary | ICD-10-CM

## 2017-03-09 DIAGNOSIS — E1142 Type 2 diabetes mellitus with diabetic polyneuropathy: Secondary | ICD-10-CM

## 2017-03-09 DIAGNOSIS — B351 Tinea unguium: Secondary | ICD-10-CM

## 2017-03-09 DIAGNOSIS — I5032 Chronic diastolic (congestive) heart failure: Secondary | ICD-10-CM | POA: Insufficient documentation

## 2017-03-09 DIAGNOSIS — G4733 Obstructive sleep apnea (adult) (pediatric): Secondary | ICD-10-CM

## 2017-03-09 DIAGNOSIS — I2510 Atherosclerotic heart disease of native coronary artery without angina pectoris: Secondary | ICD-10-CM

## 2017-03-09 HISTORY — DX: Chronic diastolic (congestive) heart failure: I50.32

## 2017-03-09 NOTE — Progress Notes (Signed)
Subjective: Marc Walker is a 46 y.o. male patient with history of diabetes who presents to office today complaining of long, painful nails  while ambulating in shoes; unable to trim. Patient states that the glucose reading this morning was 95 mg/dl. Patient denies any new changes in medication or new problems. Patient denies any new cramping, numbness, burning or tingling in the legs.  ROS as per nurse note  Patient Active Problem List   Diagnosis Date Noted  . Chronic diastolic CHF (congestive heart failure) ([place]) 03/09/2017  . Erectile dysfunction 03/02/2017  . OSA on CPAP 07/06/2016  . Morbid obesity ([place]) 07/06/2016  . DM type 2 with diabetic peripheral neuropathy ([place]) 12/16/2015  . CAD (coronary artery disease) 09/30/2015  . Tobacco abuse 09/30/2015  . Essential hypertension 09/16/2015  . Hyperlipidemia   . History of MI (myocardial infarction) 07/30/2009   Current Outpatient Prescriptions on File Prior to Visit  Medication Sig Dispense Refill  . amLODipine (NORVASC) 5 MG tablet Take 1 tablet (5 mg total) by mouth daily. 30 tablet 6  . aspirin 81 MG tablet Take 1 tablet (81 mg total) by mouth daily. 90 tablet 3  . blood glucose meter kit and supplies KIT Dispense based on patient and insurance preference. Use up to four times daily as directed. (FOR ICD-9 250.00, 250.01). 1 each 0  . buPROPion (WELLBUTRIN SR) 150 MG 12 hr tablet Take 1 tablet (150 mg total) by mouth 2 (two) times daily. 60 tablet 2  . clopidogrel (PLAVIX) 75 MG tablet Take 1 tablet (75 mg total) by mouth daily. 30 tablet 10  . ezetimibe (ZETIA) 10 MG tablet Take 1 tablet (10 mg total) by mouth daily. 90 tablet 3  . famotidine (PEPCID) 20 MG tablet Take 1 tablet (20 mg total) by mouth 2 (two) times daily. 60 tablet 3  . furosemide (LASIX) 40 MG tablet Take 1 tablet (40 mg total) by mouth 3 (three) times a week. 15 tablet 8  . gabapentin (NEURONTIN) 300 MG capsule Take 3 capsules (900 mg total) by mouth 3 (three)  times daily. 180 capsule 3  . glucose blood (TRUE METRIX BLOOD GLUCOSE TEST) test strip Use as instructed 100 each 12  . hydrochlorothiazide (HYDRODIURIL) 25 MG tablet Take 1 tablet (25 mg total) by mouth daily. 90 tablet 3  . ibuprofen (ADVIL,MOTRIN) 800 MG tablet TAKE 1 TABLET BY MOUTH EVERY 8 HOURS AS NEEDED FOR MODERATE PAIN (TAKE WITH FOOD). 40 tablet 0  . Insulin Glargine (LANTUS SOLOSTAR) 100 UNIT/ML Solostar Pen Inject 45 Units into the skin 2 (two) times daily. 30 mL 3  . insulin lispro (HUMALOG) 100 UNIT/ML injection Inject 0.2 mLs (20 Units total) into the skin 3 (three) times daily with meals. 20 mL 2  . Insulin Pen Needle 31G X 8 MM MISC Use as directed 100 each 5  . Insulin Syringe-Needle U-100 (B-D INS SYRINGE 0.5CC/31GX5/16) 31G X 5/16" 0.5 ML MISC 1 each by Does not apply route 3 (three) times daily. 90 each 3  . Insulin Syringes, Disposable, U-100 0.5 ML MISC Take insulin as directed   Diagnosis: Insulin dependent diabetes mellitus ICD10 250 1 each 0  . lisinopril (PRINIVIL,ZESTRIL) 10 MG tablet Take 1 tablet (10 mg total) by mouth 2 (two) times daily. 60 tablet 3  . metFORMIN (GLUCOPHAGE-XR) 500 MG 24 hr tablet TAKE 2 TABLETS BY MOUTH 2 TIMES DAILY WITH A MEAL. 120 tablet 2  . metoprolol tartrate (LOPRESSOR) 50 MG tablet Take 1 tablet (50 mg  total) by mouth 2 (two) times daily. 90 tablet 11  . nitroGLYCERIN (NITROSTAT) 0.4 MG SL tablet Place 1 tablet (0.4 mg total) under the tongue every 5 (five) minutes as needed for chest pain. 30 tablet 12  . polyethylene glycol (MIRALAX) packet Take 17 g by mouth daily. 14 each 0  . potassium chloride (K-DUR) 10 MEQ tablet Take 1 tablet (10 mEq total) by mouth daily. 30 tablet 2  . rosuvastatin (CRESTOR) 40 MG tablet Take 1 tablet (40 mg total) by mouth daily. 90 tablet 3  . TRUEPLUS LANCETS 28G MISC Use as directed 100 each 12   No current facility-administered medications on file prior to visit.    Allergies  Allergen Reactions  .  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  Pt only wants pain medicine if absolutely necessary.  . Levofloxacin Rash    Rash on back (not sun exposed)and hypersensitivity to skin on exposed skin/arm    Recent Results (from the past 2160 hour(s))  Rapid strep screen     Status: None   Collection Time: 02/26/17 10:18 PM  Result Value Ref Range   Streptococcus, Group A Screen (Direct) NEGATIVE NEGATIVE    Comment: (NOTE) A Rapid Antigen test may result negative if the antigen level in the sample is below the detection level of this test. The FDA has not cleared this test as a stand-alone test therefore the rapid antigen negative result has reflexed to a Group A Strep culture.   Culture, group A strep     Status: None   Collection Time: 02/26/17 10:18 PM  Result Value Ref Range   Specimen Description THROAT    Special Requests NONE Reflexed from [id number]    Culture      NO GROUP A STREP (S.PYOGENES) ISOLATED Performed at [facility], [place] [address], [place], [place] [address]    Report Status 03/01/2017 FINAL   POCT glucose (manual entry)     Status: Abnormal   Collection Time: 03/02/17  1:44 PM  Result Value Ref Range   POC Glucose 124 (A) 70 - 99 mg/dl  POCT glycosylated hemoglobin (Hb A1C)     Status: Abnormal   Collection Time: 03/02/17  1:44 PM  Result Value Ref Range   Hemoglobin A1C 7.0     Objective: General: Patient is awake, alert, and oriented x 3 and in no acute distress.  Integument: Skin is warm, dry and supple bilateral. Nails are tender, long, thickened and  dystrophic with subungual debris, consistent with onychomycosis, 1-5 bilateral. No signs of infection. No open lesions or preulcerative lesions present bilateral. Remaining integument unremarkable.  Vasculature:  Dorsalis Pedis pulse 1/4 bilateral. Posterior Tibial pulse  1/4 bilateral.  Capillary fill time <3 sec 1-5 bilateral. Positive hair growth to the level of the  digits. Temperature gradient within normal limits. + varicosities present bilateral. No edema present bilateral.   Neurology: The patient has intact sensation measured with a 5.07/10g Semmes Weinstein Monofilament at all pedal sites bilateral. Vibratory sensation diminished bilateral with tuning fork. No Babinski sign present bilateral.   Musculoskeletal: Asymptomatic pes planus pedal deformities noted bilateral. Muscular strength 5/5 in all lower extremity muscular groups bilateral without pain on range of motion . No tenderness with calf compression bilateral.  Assessment and Plan: Problem List Items Addressed This Visit    None    Visit Diagnoses    Pain due to onychomycosis of nail    -  Primary   Diabetic polyneuropathy associated with type 2 diabetes mellitus ([place])           -Examined patient. -Discussed and educated patient on diabetic foot care, especially with  regards to the vascular, neurological and musculoskeletal systems.  -Stressed the importance of good glycemic control and the detriment of not  controlling glucose levels in relation to the foot. -Mechanically debrided all nails 1-5 bilateral using sterile nail nipper and filed with dremel without incident  -WILL CONSIDER DIABETIC SHOES AND INSOLES AT NEXT VISIT -Answered all patient questions -Patient to return  in 3 months for at risk foot care -Patient advised to call the office if any problems or questions arise in the meantime.  Landis Martins, DPM

## 2017-03-09 NOTE — Progress Notes (Signed)
   Subjective:    Patient ID: Marc Walker, male    DOB: 12-01-1970, 46 y.o.   MRN: [id number]  HPI    Review of Systems  Eyes: Positive for visual disturbance.  Respiratory: Positive for shortness of breath and wheezing.   All other systems reviewed and are negative.      Objective:   Physical Exam        Assessment & Plan:

## 2017-03-09 NOTE — Progress Notes (Signed)
Cardiology Office Note:    Date:  03/09/2017   ID:  Marc Walker, DOB 1971/03/03, MRN [id number]  PCP:  Ladell Pier, MD  Cardiologist:  Dr. Dorris Carnes   Sleep Med:  Dr. Fransico Him   Referring MD: Maren Reamer, MD   Chief Complaint  Patient presents with  . Coronary Artery Disease    follow up    History of Present Illness:    Marc Walker is a 46 y.o. male with a hx of CAD s/pprior myocardial infarction, s/p multipleprior stents placedat outside institutions, DM, HTN, HL, tobacco abuse, OSA, diastolic HF. He was admitted to [facility] in 3/17 with unstable angina. LHC demonstrated 80% mid RCA stenosis treated with DES, 90% proximal LAD and 70% mid LAD stenosis involving the previous stent treated with DES, 20% RPDA in-stent restenosis treated with angioplasty. The stent in OM2 was patent. Residual disease included 90% D2 stenosis and 70% mid LCx stenosis, treated medically. Last nuclear stress test in 9/17 demonstrated mild ischemia in the distal OM or PLA branch territory. This was overall felt to be low risk. Medical therapy was continued.  He does have a history of atypical chest pain. He was admitted in 1/18 with chest pain and mildly elevated troponin levels. He was seen by cardiology. Echocardiogram demonstrated normal LV function. Presentation was not felt to be related to ACS and medical therapy was continued.  Last seen in clinic by [facility], PA-C in 4/18.    Marc Walker returns for follow up.  He is here alone.  He recently quit smoking and is quite excited about that.  He denies chest pain. He has chronic dyspnea on exertion that is unchanged.  He denies paroxysmal nocturnal dyspnea.  He notes chronic LE edema.  He denies syncope.    Prior CV studies:   The following studies were reviewed today:  Echocardiogram 07/10/16 Mild LVH, EF 60-65, Gr 2 DD, mild LAE; difficult study-no obvious wall motion abnormalities with Definity  Myoview  03/26/16  Low risk stress nuclear study with mild ischemia in the distribution of a distal OM or PLA branch.  EF 55   LHC 09/16/15 LAD ostial stent ok, prox 90%, mid 70% ISR, D2 90% LCx mid 70%, OM2 stent ok RCA mid 80%, dist stent ok, RPDA ostial stent ok with 20% ISR,  PCI: 2.5 x 20 mm Synergy DES to prox/mid LAD;  3.25 x 18 mm Xience Alpine DES to mid RCA; Angiosculpt POBA to RPDA Post-Intervention Diagram     Past Medical History:  Diagnosis Date  . Chronic diastolic CHF (congestive heart failure) ([place]) 03/09/2017   Echo 1/18: Mild LVH, EF 60-65, Gr 2 DD, mild LAE; difficult study-no obvious wall motion abnormalities with Definity  . Coronary artery disease   . Diabetes mellitus without complication ([place])   . Heartburn   . High cholesterol   . Hyperlipidemia associated with type 2 diabetes mellitus ([place])   . Hypertension   . MI (myocardial infarction) ([place]) 07/2009   [facility] in [place], [place] for last 2, 1st one in [place], [place]; total of 6 stents, last one 02/2014   . Morbid obesity ([place]) 07/06/2016  . OSA (obstructive sleep apnea) 07/06/2016   Moderate with AHI 21.8/hr by PSG 08/2012 now on CPAP  . Thyroid disease    past history    Past Surgical History:  Procedure Laterality Date  . APPENDECTOMY    . CARDIAC CATHETERIZATION N/A 09/16/2015   Procedure:  Left Heart Cath and Coronary Angiography;  Surgeon: Lorretta Harp, MD;  Location: [facility];  Service: Cardiovascular;  Laterality: N/A;  . CARDIAC CATHETERIZATION N/A 09/16/2015   Procedure: Coronary Stent Intervention;  Surgeon: Lorretta Harp, MD;  Location: [facility];  Service: Cardiovascular;  Laterality: N/A;  . CORONARY STENT PLACEMENT      Current Medications: Current Meds  Medication Sig  . amLODipine (NORVASC) 5 MG tablet Take 1 tablet (5 mg total) by mouth daily.  Marland Kitchen aspirin 81 MG tablet Take 1 tablet (81 mg total) by mouth daily.  . blood glucose meter kit and supplies KIT Dispense  based on patient and insurance preference. Use up to four times daily as directed. (FOR ICD-9 250.00, 250.01).  Marland Kitchen buPROPion (WELLBUTRIN SR) 150 MG 12 hr tablet Take 1 tablet (150 mg total) by mouth 2 (two) times daily.  . clopidogrel (PLAVIX) 75 MG tablet Take 1 tablet (75 mg total) by mouth daily.  Marland Kitchen ezetimibe (ZETIA) 10 MG tablet Take 1 tablet (10 mg total) by mouth daily.  . famotidine (PEPCID) 20 MG tablet Take 1 tablet (20 mg total) by mouth 2 (two) times daily.  . furosemide (LASIX) 40 MG tablet Take 1 tablet (40 mg total) by mouth 3 (three) times a week.  . gabapentin (NEURONTIN) 300 MG capsule Take 3 capsules (900 mg total) by mouth 3 (three) times daily.  Marland Kitchen glucose blood (TRUE METRIX BLOOD GLUCOSE TEST) test strip Use as instructed  . hydrochlorothiazide (HYDRODIURIL) 25 MG tablet Take 1 tablet (25 mg total) by mouth daily.  Marland Kitchen ibuprofen (ADVIL,MOTRIN) 800 MG tablet TAKE 1 TABLET BY MOUTH EVERY 8 HOURS AS NEEDED FOR MODERATE PAIN (TAKE WITH FOOD).  . Insulin Glargine (LANTUS SOLOSTAR) 100 UNIT/ML Solostar Pen Inject 45 Units into the skin 2 (two) times daily.  . insulin lispro (HUMALOG) 100 UNIT/ML injection Inject 0.2 mLs (20 Units total) into the skin 3 (three) times daily with meals.  . Insulin Pen Needle 31G X 8 MM MISC Use as directed  . Insulin Syringe-Needle U-100 (B-D INS SYRINGE 0.5CC/31GX5/16) 31G X 5/16" 0.5 ML MISC 1 each by Does not apply route 3 (three) times daily.  . Insulin Syringes, Disposable, U-100 0.5 ML MISC Take insulin as directed   Diagnosis: Insulin dependent diabetes mellitus ICD10 250  . lisinopril (PRINIVIL,ZESTRIL) 10 MG tablet Take 1 tablet (10 mg total) by mouth 2 (two) times daily.  . metFORMIN (GLUCOPHAGE-XR) 500 MG 24 hr tablet TAKE 2 TABLETS BY MOUTH 2 TIMES DAILY WITH A MEAL.  . metoprolol tartrate (LOPRESSOR) 50 MG tablet Take 1 tablet (50 mg total) by mouth 2 (two) times daily.  . nitroGLYCERIN (NITROSTAT) 0.4 MG SL tablet Place 1 tablet (0.4 mg  total) under the tongue every 5 (five) minutes as needed for chest pain.  . polyethylene glycol (MIRALAX) packet Take 17 g by mouth daily.  . potassium chloride (K-DUR) 10 MEQ tablet Take 1 tablet (10 mEq total) by mouth daily.  . rosuvastatin (CRESTOR) 40 MG tablet Take 1 tablet (40 mg total) by mouth daily.  . TRUEPLUS LANCETS 28G MISC Use as directed     Allergies:   Other and Levofloxacin   Social History   Social History  . Marital status: Divorced    Spouse name: N/A  . Number of children: N/A  . Years of education: N/A   Occupational History  . Biochemist, clinical    Social History Main Topics  . Smoking status:  Former Smoker  . Smokeless tobacco: Never Used     Comment: quit 01/2017  . Alcohol use No  . Drug use: No  . Sexual activity: Not Asked   Other Topics Concern  . None   Social History Narrative   Adopted. Very little info on biological parents, ?diabetes.   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 He was adopted.  ROS:   Please see the history of present illness.    Review of Systems  Constitution: Positive for weakness.  Eyes: Positive for visual disturbance.  Cardiovascular: Positive for dyspnea on exertion and leg swelling.  Respiratory: Positive for snoring and wheezing.    All other systems reviewed and are negative.   EKGs/Labs/Other Test Reviewed:    EKG:  EKG is  ordered today.  The ekg ordered today demonstrates NSR, HR 62, unusual P axis, nonspecific ST-T wave changes, QTc 440 ms, no change from prior tracing  Recent Labs: 07/09/2016: B Natriuretic Peptide 71.4 07/11/2016: Hemoglobin 12.6; Platelets 202 08/21/2016: ALT 28 10/20/2016: BUN 18; Creatinine, Ser 0.70; Potassium 4.2; Sodium 137   Recent Lipid Panel Lab Results  Component Value Date/Time   CHOL 104 09/18/2016 10:28 AM   TRIG 76 09/18/2016 10:28 AM   HDL 39 (L) 09/18/2016 10:28 AM   CHOLHDL 2.7  09/18/2016 10:28 AM   CHOLHDL 2.9 07/10/2016 04:16 AM   LDLCALC 50 09/18/2016 10:28 AM    Physical Exam:    VS:  BP 138/60   Pulse 62   Ht _0  (1.651 m)   Wt 293 lb 12.8 oz (133.3 kg)   SpO2 96%   BMI 48.89 kg/m     Wt Readings from Last 3 Encounters:  03/09/17 293 lb 12.8 oz (133.3 kg)  03/02/17 297 lb 12.8 oz (135.1 kg)  02/26/17 288 lb 12.8 oz (131 kg)     Physical Exam  Constitutional: He is oriented to person, place, and time. He appears well-developed and well-nourished. No distress.  HENT:  Head: Normocephalic and atraumatic.  Eyes: No scleral icterus.  Neck: Normal range of motion. JVD: I cannot assess JVD.  Cardiovascular: Normal rate, regular rhythm, S1 normal and S2 normal.   No murmur heard. Pulmonary/Chest: Effort normal and breath sounds normal. He has no wheezes. He has no rhonchi. He has no rales.  Abdominal: Soft. There is no hepatomegaly.  Musculoskeletal: He exhibits edema (trace bilat LE edema).  Neurological: He is alert and oriented to person, place, and time.  Skin: Skin is warm and dry.  Psychiatric: He has a normal mood and affect.    ASSESSMENT:    1. Coronary artery disease involving native coronary artery of native heart without angina pectoris   2. Chronic diastolic CHF (congestive heart failure) ([place])   3. Essential hypertension   4. Tobacco abuse   5. Morbid obesity ([place])   6. OSA (obstructive sleep apnea)    PLAN:    In order of problems listed above:  1. Coronary artery disease involving native coronary artery of native heart without angina pectoris  S/p multiple PCI procedures in the past.  Nuclear stress test in 2017 was low risk.  He denies recurrent angina.  Continue Amlodipine, ASA, Clopidogrel, Ezetimibe, Rosuvastatin, Metoprolol.  2. Chronic diastolic CHF (congestive heart failure) (H[facility]) Volume stable.  He is dyspnea is likely multifactorial and related to CAD, diastolic HF, obesity, OSA, deconditioning, smoking. Continue  current  Rx.  3. Essential hypertension The patient's blood pressure is controlled on his current regimen.  Continue current therapy.    4. Tobacco abuse I congratulated him on quitting smoking.   5. Morbid obesity ([place]) We discussed the importance of weight loss and some different strategies for losing weight.  6. OSA (obstructive sleep apnea) He complains of difficulty with daytime hypersomnolence.  He tells me he was caught sleeping on the job and was fired.  I will see if his follow up sleep study can be done sooner or if he can get back to see Dr. Fransico Him sooner.    Dispo:  Return in about 6 months (around 09/06/2017) for Routine Follow Up, w/ Dr. Harrington Challenger, or [name], PA-C.   Medication Adjustments/Labs and Tests Ordered: Current medicines are reviewed at length with the patient today.  Concerns regarding medicines are outlined above.  Tests Ordered: Orders Placed This Encounter  Procedures  . EKG 12-Lead   Medication Changes: No orders of the defined types were placed in this encounter.   Signed, [name], PA-C  03/09/2017 10:32 AM    [facility] [place], [place], [place]  [address] Phone: [phone]; Fax: [phone]

## 2017-03-09 NOTE — Patient Instructions (Signed)
Medication Instructions:  1. Your physician recommends that you continue on your current medications as directed. Please refer to the Current Medication list given to you today.   Labwork: NONE ORDERED TODAY  Testing/Procedures: NONE ORDERED TODAY  Follow-Up: Your physician wants you to follow-up in: 6 MONTHS WITH DR. ROSS You will receive a reminder letter in the mail two months in advance. If you don't receive a letter, please call our office to schedule the follow-up appointment.   Any Other Special Instructions Will Be Listed Below (If Applicable).  NINA JONES, CMA FROM OUR OFFICE WILL CALL YOU IN REGARDS TO TRYING TO GET YOUR SLEEP STUDY MOVED UP TO AN EARLIER DATE.    If you need a refill on your cardiac medications before your next appointment, please call your pharmacy.

## 2017-03-09 NOTE — Patient Instructions (Signed)

## 2017-03-10 ENCOUNTER — Encounter: Payer: Self-pay | Admitting: Internal Medicine

## 2017-03-10 NOTE — Telephone Encounter (Signed)
Patient concern

## 2017-03-10 NOTE — Telephone Encounter (Signed)
Patient response

## 2017-03-17 ENCOUNTER — Other Ambulatory Visit: Payer: Self-pay | Admitting: Internal Medicine

## 2017-03-17 MED ORDER — METFORMIN HCL ER 500 MG PO TB24: ORAL_TABLET | ORAL | 11 refills | Status: DC

## 2017-03-17 MED FILL — METFORMIN HCL ER 500 MG TAB: 500 | 30 days supply | Qty: 120 | Fill #0

## 2017-03-22 ENCOUNTER — Telehealth: Payer: Self-pay | Admitting: *Deleted

## 2017-03-22 DIAGNOSIS — G4733 Obstructive sleep apnea (adult) (pediatric): Secondary | ICD-10-CM

## 2017-03-22 NOTE — Telephone Encounter (Signed)
He will need to have a PSG on CPAP and then an MSLT test done the next am at the sleep lab - please order - this is to make sure he is on right CPAP setting and then rule out narcolepsy

## 2017-03-22 NOTE — Telephone Encounter (Signed)
Patient called today asking for a prescription for medication to keep him awake.  Patient states he was fired from his job because he fell asleep at work. Patient was encouraged to contact his primary doctor but he thought it made more sense to him to contact his sleep doctor. Patient was advised that the doctor may order a sleep-aide for a sleep study but it has not been her practice to prescribe medication to stay alert. Please advise

## 2017-03-23 NOTE — Telephone Encounter (Signed)
Ok patient is scheduled for a split night on October 8.

## 2017-03-23 NOTE — Telephone Encounter (Signed)
Is the MSLT to follow the next day?

## 2017-03-23 NOTE — Telephone Encounter (Signed)
No Dr Mayford Knife that appointment was made last month, would you like for me to add on with CPAP and add the MSLT test? Please advise.

## 2017-03-24 NOTE — Telephone Encounter (Signed)
yes

## 2017-04-01 NOTE — Telephone Encounter (Signed)
RE: Cancelation list  Meredith Mody, Charmaine M; Paislei Dorval G, CMA        Pam spoke with him and he will be self pay. Looks like he is scheduled for 10/8.  Amy

## 2017-04-01 NOTE — Telephone Encounter (Signed)
Reached out to patient to confirm that he will self pay for his sleep study and the patient confirmed with me that he will self pay. Patient's Split night sleep study has been changed from Monday 04/05/17 to Tuesday May 04 2017 with a MSLT Test to follow on Wednesday November 7.  Patient has been notified.

## 2017-04-05 ENCOUNTER — Encounter (HOSPITAL_BASED_OUTPATIENT_CLINIC_OR_DEPARTMENT_OTHER): Payer: Self-pay

## 2017-04-05 ENCOUNTER — Telehealth: Payer: Self-pay | Admitting: Internal Medicine

## 2017-04-05 NOTE — Telephone Encounter (Signed)
Pt father called to informed you, that pt was sleep problem, now he can't stay awake,  He is very concern, the sleep study cancel few time, he is very worry, so far the sleep problem has not been solve, the sleep study center has been cancel so many time the facility is the one has cancel the appt, please follow up

## 2017-04-06 ENCOUNTER — Other Ambulatory Visit: Payer: Self-pay | Admitting: Pharmacist

## 2017-04-06 MED ORDER — BUPROPION HCL ER (SR) 150 MG PO TB12: 150.0000 mg | ORAL_TABLET | Freq: Two times a day (BID) | ORAL | 2 refills | Status: DC

## 2017-04-06 MED FILL — AMLODIPINE BESYLATE 5 MG TA: 5 | 30 days supply | Qty: 30 | Fill #8

## 2017-04-06 MED FILL — $LANTUS SOLOSTAR 100 UNITS/: 100 | 30 days supply | Qty: 27 | Fill #2

## 2017-04-06 MED FILL — METFORMIN HCL ER 500 MG TAB: 500 | 30 days supply | Qty: 120 | Fill #1

## 2017-04-06 MED FILL — METOPROLOL TARTRATE 50 MG T: 50 | 30 days supply | Qty: 60 | Fill #4

## 2017-04-06 MED FILL — POTASSIUM CL 10 MEQ TAB SA: 10 | 30 days supply | Qty: 30 | Fill #1

## 2017-04-06 MED FILL — ROSUVASTATIN CALCIUM 40 MG: 40 | 30 days supply | Qty: 30 | Fill #4

## 2017-04-06 MED FILL — !HUMALOG 100 UNITS/ML KWIKP: 100 | 25 days supply | Qty: 15 | Fill #2

## 2017-04-06 MED FILL — BUPROPION SR 150 MG TABLET: 150 | 30 days supply | Qty: 60 | Fill #0

## 2017-04-06 MED FILL — ?CLOPIDOGREL 75MG TAB: 75 | 30 days supply | Qty: 30 | Fill #4

## 2017-04-06 MED FILL — TRUEplus LANCETS 28G MISC: 30 days supply | Qty: 100 | Fill #4

## 2017-04-06 MED FILL — JARDIANCE 10 MG TABLET: 10 | 30 days supply | Qty: 30 | Fill #3

## 2017-04-06 MED FILL — LISINOPRIL 10 MG TABS: 10 | 30 days supply | Qty: 60 | Fill #3

## 2017-04-06 MED FILL — HYDROCHLOROTHIAZIDE 25 MG T: 25 | 30 days supply | Qty: 30 | Fill #4

## 2017-04-06 MED FILL — FUROSEMIDE 40 MG TABLET: 40 | 30 days supply | Qty: 15 | Fill #1

## 2017-04-06 NOTE — Progress Notes (Signed)
Per Dr. Tenny Craw, routing OV note and the following recommendation to Dr. Monte Fantasia, DDS fax: [phone]. Forward note to Dr Magnus Sinning DDS  OK to do cleaning on Plavix.  If doing more with extractions can stop Plavix 5 days Resume after

## 2017-04-07 MED FILL — TRUEPLUS PEN NDL 31GX3/16: 31G X 5 MM | 30 days supply | Qty: 100 | Fill #3

## 2017-04-07 MED FILL — TRUEPLUS PEN NDL 31GX3/16": 31G X 5 MM | 30 days supply | Qty: 100 | Fill #3

## 2017-04-08 ENCOUNTER — Encounter: Payer: Self-pay | Admitting: Internal Medicine

## 2017-04-08 ENCOUNTER — Ambulatory Visit: Payer: Self-pay | Attending: Internal Medicine | Admitting: Internal Medicine

## 2017-04-08 VITALS — BP 107/73 | HR 59 | Temp 98.2°F | Resp 16 | Wt 297.0 lb

## 2017-04-08 DIAGNOSIS — I5032 Chronic diastolic (congestive) heart failure: Secondary | ICD-10-CM | POA: Insufficient documentation

## 2017-04-08 DIAGNOSIS — E785 Hyperlipidemia, unspecified: Secondary | ICD-10-CM | POA: Insufficient documentation

## 2017-04-08 DIAGNOSIS — I252 Old myocardial infarction: Secondary | ICD-10-CM | POA: Insufficient documentation

## 2017-04-08 DIAGNOSIS — Z794 Long term (current) use of insulin: Secondary | ICD-10-CM | POA: Insufficient documentation

## 2017-04-08 DIAGNOSIS — Z87891 Personal history of nicotine dependence: Secondary | ICD-10-CM | POA: Insufficient documentation

## 2017-04-08 DIAGNOSIS — G4733 Obstructive sleep apnea (adult) (pediatric): Secondary | ICD-10-CM | POA: Insufficient documentation

## 2017-04-08 DIAGNOSIS — N529 Male erectile dysfunction, unspecified: Secondary | ICD-10-CM | POA: Insufficient documentation

## 2017-04-08 DIAGNOSIS — E1140 Type 2 diabetes mellitus with diabetic neuropathy, unspecified: Secondary | ICD-10-CM | POA: Insufficient documentation

## 2017-04-08 DIAGNOSIS — G5603 Carpal tunnel syndrome, bilateral upper limbs: Secondary | ICD-10-CM | POA: Insufficient documentation

## 2017-04-08 DIAGNOSIS — Z79899 Other long term (current) drug therapy: Secondary | ICD-10-CM | POA: Insufficient documentation

## 2017-04-08 DIAGNOSIS — M7989 Other specified soft tissue disorders: Secondary | ICD-10-CM | POA: Insufficient documentation

## 2017-04-08 DIAGNOSIS — E1142 Type 2 diabetes mellitus with diabetic polyneuropathy: Secondary | ICD-10-CM

## 2017-04-08 DIAGNOSIS — I110 Hypertensive heart disease with heart failure: Secondary | ICD-10-CM | POA: Insufficient documentation

## 2017-04-08 DIAGNOSIS — I10 Essential (primary) hypertension: Secondary | ICD-10-CM

## 2017-04-08 DIAGNOSIS — Z833 Family history of diabetes mellitus: Secondary | ICD-10-CM | POA: Insufficient documentation

## 2017-04-08 DIAGNOSIS — Z888 Allergy status to other drugs, medicaments and biological substances status: Secondary | ICD-10-CM | POA: Insufficient documentation

## 2017-04-08 DIAGNOSIS — I2510 Atherosclerotic heart disease of native coronary artery without angina pectoris: Secondary | ICD-10-CM | POA: Insufficient documentation

## 2017-04-08 DIAGNOSIS — Z9889 Other specified postprocedural states: Secondary | ICD-10-CM | POA: Insufficient documentation

## 2017-04-08 DIAGNOSIS — Z7982 Long term (current) use of aspirin: Secondary | ICD-10-CM | POA: Insufficient documentation

## 2017-04-08 DIAGNOSIS — N400 Enlarged prostate without lower urinary tract symptoms: Secondary | ICD-10-CM | POA: Insufficient documentation

## 2017-04-08 LAB — GLUCOSE, POCT (MANUAL RESULT ENTRY): POC GLUCOSE: 149 mg/dL — AB (ref 70–99)

## 2017-04-08 MED ORDER — EMPAGLIFLOZIN 10 MG PO TABS: 10.0000 mg | ORAL_TABLET | Freq: Every day | ORAL | 6 refills | Status: DC

## 2017-04-08 MED ORDER — AMLODIPINE BESYLATE 5 MG PO TABS: 5.0000 mg | ORAL_TABLET | Freq: Every day | ORAL | 6 refills | Status: DC

## 2017-04-08 MED ORDER — LISINOPRIL 10 MG PO TABS: 10.0000 mg | ORAL_TABLET | Freq: Two times a day (BID) | ORAL | 3 refills | Status: DC

## 2017-04-08 MED ORDER — EZETIMIBE 10 MG PO TABS: 10.0000 mg | ORAL_TABLET | Freq: Every day | ORAL | 3 refills | Status: DC

## 2017-04-08 NOTE — Progress Notes (Signed)
Patient ID: Marc Walker, male    DOB: 04/28/1971  MRN: [id number]  CC: sleeping issues   Subjective: Marc Walker is a 46 y.o. male who presents for UC visit. Last seen 03/02/2017. His father, Marc Walker, is with him. His concerns today include:  46 year old male with history of CAD (8 stents), DM type II, HL, HTN, obesity, OSA on CPAP, tobacco dependence, BPH.   1. Father is concerned about how easily pt falls asleep, lound snoring and the fact that he sometimes quit breathing.  -other day, pt hit the side of his face on kitchen counter because he fell asleep while sitting there.  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 Pt loss his job.  -sleep study cancelled 3 x. First time he needed insurance approval but pt has since loss insurance. He is now scheduled for sleep study with MSLT 05/08/2017 -has 2 mask. The one that works well is broken. The other one was one given to him after he left hosp but does not fit as well and is uncomfortable. He wakes up with mask off, either it slips off or he takes it off in the middle of the night -father has not allowed him to drive -called for jury duty. Wants letter to allow him to be excused  2. Swelling in hands, intermittent x several yrs "Hands and feet feel like they have frost bite on them." -endorses numbness in hands and feet intermittently for yrs.  Sometimes he drops objects. Was on Gabapentin but out x several mths.   3. Med reconciliation: Has all meds with him today. On last visit he told me he was not on Jardiance because of insurance. However, he has bottle with him today last filled early last mth. He was taking but now out. Wants to know if he can get through our pharmacy since he no longer has insurance. .  Out of Gabapentin x 2-3 mths  Patient Active Problem List   Diagnosis Date Noted  . Bilateral carpal tunnel syndrome 04/08/2017  . Chronic diastolic CHF (congestive heart failure) ([place]) 03/09/2017  . Erectile  dysfunction 03/02/2017  . OSA on CPAP 07/06/2016  . Morbid obesity ([place]) 07/06/2016  . DM type 2 with diabetic peripheral neuropathy ([place]) 12/16/2015  . CAD (coronary artery disease) 09/30/2015  . Tobacco abuse 09/30/2015  . Essential hypertension 09/16/2015  . Hyperlipidemia   . History of MI (myocardial infarction) 07/30/2009     Current Outpatient Prescriptions on File Prior to Visit  Medication Sig Dispense Refill  . aspirin 81 MG tablet Take 1 tablet (81 mg total) by mouth daily. 90 tablet 3  . blood glucose meter kit and supplies KIT Dispense based on patient and insurance preference. Use up to four times daily as directed. (FOR ICD-9 250.00, 250.01). 1 each 0  . buPROPion (WELLBUTRIN SR) 150 MG 12 hr tablet Take 1 tablet (150 mg total) by mouth 2 (two) times daily. 60 tablet 2  . clopidogrel (PLAVIX) 75 MG tablet Take 1 tablet (75 mg total) by mouth daily. 30 tablet 10  . famotidine (PEPCID) 20 MG tablet Take 1 tablet (20 mg total) by mouth 2 (two) times daily. 60 tablet 3  . furosemide (LASIX) 40 MG tablet Take 1 tablet (40 mg total) by mouth 3 (three) times a week. 15 tablet 8  . gabapentin (NEURONTIN) 300 MG capsule Take 3 capsules (900 mg total) by mouth 3 (three) times daily. 180 capsule 3  . glucose blood (  TRUE METRIX BLOOD GLUCOSE TEST) test strip Use as instructed 100 each 12  . hydrochlorothiazide (HYDRODIURIL) 25 MG tablet Take 1 tablet (25 mg total) by mouth daily. 90 tablet 3  . ibuprofen (ADVIL,MOTRIN) 800 MG tablet TAKE 1 TABLET BY MOUTH EVERY 8 HOURS AS NEEDED FOR MODERATE PAIN (TAKE WITH FOOD). 40 tablet 0  . Insulin Glargine (LANTUS SOLOSTAR) 100 UNIT/ML Solostar Pen Inject 45 Units into the skin 2 (two) times daily. 30 mL 3  . insulin lispro (HUMALOG) 100 UNIT/ML injection Inject 0.2 mLs (20 Units total) into the skin 3 (three) times daily with meals. 20 mL 2  . Insulin Pen Needle 31G X 8 MM MISC Use as directed 100 each 5  . Insulin Syringe-Needle U-100 (B-D INS  SYRINGE 0.5CC/31GX5/16) 31G X 5/16" 0.5 ML MISC 1 each by Does not apply route 3 (three) times daily. 90 each 3  . Insulin Syringes, Disposable, U-100 0.5 ML MISC Take insulin as directed   Diagnosis: Insulin dependent diabetes mellitus ICD10 250 1 each 0  . metFORMIN (GLUCOPHAGE-XR) 500 MG 24 hr tablet TAKE 2 TABLETS BY MOUTH 2 TIMES DAILY WITH A MEAL. 120 tablet 11  . metoprolol tartrate (LOPRESSOR) 50 MG tablet Take 1 tablet (50 mg total) by mouth 2 (two) times daily. 90 tablet 11  . nitroGLYCERIN (NITROSTAT) 0.4 MG SL tablet Place 1 tablet (0.4 mg total) under the tongue every 5 (five) minutes as needed for chest pain. 30 tablet 12  . polyethylene glycol (MIRALAX) packet Take 17 g by mouth daily. 14 each 0  . potassium chloride (K-DUR) 10 MEQ tablet Take 1 tablet (10 mEq total) by mouth daily. 30 tablet 2  . rosuvastatin (CRESTOR) 40 MG tablet Take 1 tablet (40 mg total) by mouth daily. 90 tablet 3  . TRUEPLUS LANCETS 28G MISC Use as directed 100 each 12   No current facility-administered medications on file prior to visit.     Allergies  Allergen Reactions  . 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  Pt only wants pain medicine if absolutely necessary.  . Levofloxacin Rash    Rash on back (not sun exposed)and hypersensitivity to skin on exposed skin/arm    Social History   Social History  . Marital status: Divorced    Spouse name: N/A  . Number of children: N/A  . Years of education: N/A   Occupational History  . Biochemist, clinical    Social History Main Topics  . Smoking status: Former Research scientist (life sciences)  . Smokeless tobacco: Never Used     Comment: quit 01/2017  . Alcohol use No  . Drug use: No  . Sexual activity: Not on file   Other Topics Concern  . Not on file   Social History Narrative   Adopted. Very little info on biological parents, ?diabetes.   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 Adopted: Yes    . Diabetes Maternal Grandmother     Past Surgical History:  Procedure Laterality Date  . APPENDECTOMY    . CARDIAC CATHETERIZATION N/A 09/16/2015   Procedure: Left Heart Cath and Coronary Angiography;  Surgeon: Lorretta Harp, MD;  Location: [facility];  Service: Cardiovascular;  Laterality: N/A;  . CARDIAC CATHETERIZATION N/A 09/16/2015   Procedure: Coronary Stent Intervention;  Surgeon: Lorretta Harp, MD;  Location: [facility];  Service: Cardiovascular;  Laterality: N/A;  . CORONARY STENT PLACEMENT      ROS: Review of Systems Neg except as above PHYSICAL EXAM: BP 107/73   Pulse (!) 59   Temp 98.2 F (36.8 C) (Oral)   Resp 16   Wt 297 lb (134.7 kg)   SpO2 96%   BMI 48.67 kg/m   Wt Readings from Last 3 Encounters:  04/08/17 297 lb (134.7 kg)  03/09/17 293 lb (132.9 kg)  03/09/17 293 lb 12.8 oz (133.3 kg)    Physical Exam General appearance - morbidly obese, middle age caucasian maler in NAD Mental status -oriented to person and place Chest - clear to auscultation, no wheezes, rales or rhonchi, symmetric air entry Heart - normal rate, regular rhythm, normal S1, S2, no murmurs, rubs, clicks or gallops Neurological - grip 5/5 BL. No muscle wasting in hands. + tinel"s BL.  Musculoskeletal - mild edema of dorsum hands Extremities -no LE edema Diabetic Foot Exam - Simple   Simple Foot Form Visual Inspection No deformities, no ulcerations, no other skin breakdown bilaterally:  Yes Sensation Testing See comments:  Yes Pulse Check Posterior Tibialis and Dorsalis pulse intact bilaterally:  Yes Comments LEEP exam is abnormal with decrease sensation on plantar surface bilaterally.     Results for orders placed or performed in visit on 04/08/17  POCT glucose (manual entry)  Result Value Ref Range   POC Glucose 149 (A) 70 - 99 mg/dl    ASSESSMENT AND PLAN: 1. OSA (obstructive sleep apnea) +/- narcolepsy -encouraged him to sleep with mask on.  I  will have my CMA call over to [facility] to see if sleep study can be moved up -stop driving until appropriately treated  -letter given to be excused from jury duty - Comprehensive metabolic panel  2. DM type 2 with diabetic peripheral neuropathy (HCC) -restart Gabapentin at low dose. Advised that med can cause drowsiness. Would like to see him on appropriate level of pressure for OSA before increasing dose - POCT glucose (manual entry) - empagliflozin (JARDIANCE) 10 MG TABS tablet; Take 10 mg by mouth daily.  Dispense: 30 tablet; Refill: 6 - Comprehensive metabolic panel  3. Essential hypertension - lisinopril (PRINIVIL,ZESTRIL) 10 MG tablet; Take 1 tablet (10 mg total) by mouth 2 (two) times daily.  Dispense: 60 tablet; Refill: 3  4. Bilateral carpal tunnel syndrome + DM neuropathy. Gabapentin for now. Hands too swollen to allow for wrist splints   Patient was given the opportunity to ask questions.  Patient verbalized understanding of the plan and was able to repeat key elements of the plan.   Orders Placed This Encounter  Procedures  . Comprehensive metabolic panel  . POCT glucose (manual entry)     Requested Prescriptions   Signed Prescriptions Disp Refills  . amLODipine (NORVASC) 5 MG tablet 30 tablet 6    Sig: Take 1 tablet (5 mg total) by mouth daily.  Marland Kitchen lisinopril (PRINIVIL,ZESTRIL) 10 MG tablet 60 tablet 3    Sig: Take 1 tablet (10 mg total) by mouth 2 (two) times daily.  . empagliflozin (JARDIANCE) 10 MG TABS tablet 30 tablet 6    Sig: Take 10 mg by mouth daily.  Marland Kitchen ezetimibe (ZETIA) 10 MG tablet 90 tablet 3    Sig: Take 1 tablet (10 mg total) by mouth daily.    No Follow-up on file.  Karle Plumber, MD, FACP

## 2017-04-08 NOTE — Patient Instructions (Addendum)
Do not drive until you have had your sleep study and has followed up with me.  Try to keep your mask on at nights.  Restart Gabapentin at 300 mg at bed time for the numbness and tingling in hands and feet.   Restart Jardiance.

## 2017-04-09 ENCOUNTER — Telehealth: Payer: Self-pay | Admitting: Internal Medicine

## 2017-04-09 LAB — COMPREHENSIVE METABOLIC PANEL
ALBUMIN: 4.3 g/dL (ref 3.5–5.5)
ALK PHOS: 70 IU/L (ref 39–117)
ALT: 34 IU/L (ref 0–44)
AST: 25 IU/L (ref 0–40)
Albumin/Globulin Ratio: 1.5 (ref 1.2–2.2)
BUN / CREAT RATIO: 13 (ref 9–20)
BUN: 16 mg/dL (ref 6–24)
Bilirubin Total: 0.3 mg/dL (ref 0.0–1.2)
CALCIUM: 9.8 mg/dL (ref 8.7–10.2)
CO2: 25 mmol/L (ref 20–29)
CREATININE: 1.25 mg/dL (ref 0.76–1.27)
Chloride: 95 mmol/L — ABNORMAL LOW (ref 96–106)
GFR calc Af Amer: 79 mL/min/{1.73_m2} (ref >59)
GFR, EST NON AFRICAN AMERICAN: 68 mL/min/{1.73_m2} (ref >59)
GLOBULIN, TOTAL: 2.9 g/dL (ref 1.5–4.5)
Glucose: 144 mg/dL — ABNORMAL HIGH (ref 65–99)
Potassium: 4 mmol/L (ref 3.5–5.2)
SODIUM: 137 mmol/L (ref 134–144)
Total Protein: 7.2 g/dL (ref 6.0–8.5)

## 2017-04-09 NOTE — Telephone Encounter (Signed)
Pt called requesting medication refill on gabapentin (NEURONTIN) 300 MG capsule, and test strips. Pt states he will come in Monday . Please f/up

## 2017-04-12 ENCOUNTER — Ambulatory Visit: Payer: Self-pay | Attending: Internal Medicine

## 2017-04-12 MED ORDER — GABAPENTIN 300 MG PO CAPS: 900.0000 mg | ORAL_CAPSULE | Freq: Three times a day (TID) | ORAL | 2 refills | Status: DC

## 2017-04-12 MED ORDER — GLUCOSE BLOOD VI STRP: ORAL_STRIP | 12 refills | Status: DC

## 2017-04-12 MED FILL — TRUE METRIX TEST STRIP: 30 days supply | Qty: 100 | Fill #0

## 2017-04-12 MED FILL — GABAPENTIN 300 MG CAPSULE: 300 | 20 days supply | Qty: 180 | Fill #0

## 2017-04-12 NOTE — Telephone Encounter (Signed)
Refilled requested medications/supplies

## 2017-04-14 ENCOUNTER — Other Ambulatory Visit: Payer: Self-pay

## 2017-04-14 DIAGNOSIS — E1142 Type 2 diabetes mellitus with diabetic polyneuropathy: Secondary | ICD-10-CM

## 2017-04-14 MED ORDER — EMPAGLIFLOZIN 10 MG PO TABS: 10.0000 mg | ORAL_TABLET | Freq: Every day | ORAL | 3 refills | Status: DC

## 2017-04-14 MED ORDER — INSULIN GLARGINE 100 UNIT/ML SOLOSTAR PEN: 45.0000 [IU] | PEN_INJECTOR | Freq: Two times a day (BID) | SUBCUTANEOUS | 3 refills | Status: DC

## 2017-04-14 MED ORDER — EZETIMIBE 10 MG PO TABS: 10.0000 mg | ORAL_TABLET | Freq: Every day | ORAL | 3 refills | Status: DC

## 2017-04-25 ENCOUNTER — Emergency Department (HOSPITAL_COMMUNITY)
Admission: EM | Admit: 2017-04-25 | Discharge: 2017-04-26 | Disposition: A | Discharge status: Home / Self Care | Payer: Self-pay | Attending: Emergency Medicine | Admitting: Emergency Medicine

## 2017-04-25 ENCOUNTER — Encounter (HOSPITAL_COMMUNITY): Payer: Self-pay | Admitting: Emergency Medicine

## 2017-04-25 ENCOUNTER — Emergency Department (HOSPITAL_COMMUNITY): Payer: Self-pay

## 2017-04-25 DIAGNOSIS — Z9119 Patient's noncompliance with other medical treatment and regimen: Secondary | ICD-10-CM | POA: Insufficient documentation

## 2017-04-25 DIAGNOSIS — I110 Hypertensive heart disease with heart failure: Secondary | ICD-10-CM | POA: Insufficient documentation

## 2017-04-25 DIAGNOSIS — E119 Type 2 diabetes mellitus without complications: Secondary | ICD-10-CM | POA: Insufficient documentation

## 2017-04-25 DIAGNOSIS — Z79899 Other long term (current) drug therapy: Secondary | ICD-10-CM | POA: Insufficient documentation

## 2017-04-25 DIAGNOSIS — I5032 Chronic diastolic (congestive) heart failure: Secondary | ICD-10-CM | POA: Insufficient documentation

## 2017-04-25 DIAGNOSIS — R400 Somnolence: Secondary | ICD-10-CM | POA: Insufficient documentation

## 2017-04-25 DIAGNOSIS — R2233 Localized swelling, mass and lump, upper limb, bilateral: Secondary | ICD-10-CM | POA: Insufficient documentation

## 2017-04-25 DIAGNOSIS — I2510 Atherosclerotic heart disease of native coronary artery without angina pectoris: Secondary | ICD-10-CM | POA: Insufficient documentation

## 2017-04-25 DIAGNOSIS — Z7902 Long term (current) use of antithrombotics/antiplatelets: Secondary | ICD-10-CM | POA: Insufficient documentation

## 2017-04-25 DIAGNOSIS — I252 Old myocardial infarction: Secondary | ICD-10-CM | POA: Insufficient documentation

## 2017-04-25 DIAGNOSIS — Z794 Long term (current) use of insulin: Secondary | ICD-10-CM | POA: Insufficient documentation

## 2017-04-25 DIAGNOSIS — Z87891 Personal history of nicotine dependence: Secondary | ICD-10-CM | POA: Insufficient documentation

## 2017-04-25 DIAGNOSIS — R609 Edema, unspecified: Secondary | ICD-10-CM

## 2017-04-25 LAB — BASIC METABOLIC PANEL
Anion gap: 11 (ref 5–15)
BUN: 21 mg/dL — ABNORMAL HIGH (ref 6–20)
CO2: 25 mmol/L (ref 22–32)
CREATININE: 1.36 mg/dL — AB (ref 0.61–1.24)
Calcium: 9.1 mg/dL (ref 8.9–10.3)
Chloride: 97 mmol/L — ABNORMAL LOW (ref 101–111)
GFR calc non Af Amer: >60 mL/min (ref >60)
Glucose, Bld: 196 mg/dL — ABNORMAL HIGH (ref 65–99)
Potassium: 3.8 mmol/L (ref 3.5–5.1)
Sodium: 133 mmol/L — ABNORMAL LOW (ref 135–145)

## 2017-04-25 LAB — I-STAT TROPONIN, ED: Troponin i, poc: 0 ng/mL (ref 0.00–0.08)

## 2017-04-25 LAB — CBC
HCT: 36.4 % — ABNORMAL LOW (ref 39.0–52.0)
HEMOGLOBIN: 11.9 g/dL — AB (ref 13.0–17.0)
MCH: 31 pg (ref 26.0–34.0)
MCHC: 32.7 g/dL (ref 30.0–36.0)
MCV: 94.8 fL (ref 78.0–100.0)
Platelets: 218 10*3/uL (ref 150–400)
RBC: 3.84 MIL/uL — AB (ref 4.22–5.81)
RDW: 14.6 % (ref 11.5–15.5)
WBC: 11.8 10*3/uL — ABNORMAL HIGH (ref 4.0–10.5)

## 2017-04-25 NOTE — ED Triage Notes (Signed)
Pt c/o pain and swelling to B/L hands and feet. Pt reports symptoms have been intermittent "forever." Pt reports increased shortness of breath for past couple days.

## 2017-04-25 NOTE — ED Provider Notes (Signed)
Patient with swelling of both hands and both legs and feet progressively worsening over several weeks.  His father reports that he stops breathing for a few seconds when he naps or falls asleep at night.  Patient has sleep study scheduled for first week of November.  On exam patient is alert nontoxic appearing lungs clear to auscultation heart regular rate and rhythm abdomen obese.  Bilateral upper extremities and bilateral lower extremities with pitting edema.   Marc Walker, Marc Kunkler, MD 04/26/17 [phone]

## 2017-04-25 NOTE — ED Provider Notes (Signed)
[facility] EMERGENCY DEPARTMENT Provider Note   CSN: [id number] Arrival date & time: 04/25/17  1836     History   Chief Complaint Chief Complaint  Patient presents with  . Joint Swelling     HPI   Blood pressure (!) 106/57, pulse 62, temperature 98.4 F (36.9 C), temperature source Oral, resp. rate 16, height _0  (1.676 m), weight 134.7 kg (297 lb), SpO2 96 %.  Marc Walker is a 46 y.o. male with past medical history significant for diastolic CHF, insulin-dependent diabetes, CAD complaining of increasing lower extremity edema over the course of last week he states that the edema in his hands are particularly painful today.  Father notes that he is increasingly somnolent and difficult to arouse.  He has no history of COPD.  He does not smoke cigarettes daily.  He denies any chest pain, he endorses some dyspnea on exertion which is atypical without orthopnea, PND.  Father states that he has significant daytime somnolence, he has a history of sleep apnea but is not been using his CPAP because the mask that he has it is comfortable is broken and the one that he was given from the hospital is uncomfortable.  He recently quit smoking.   Past Medical History:  Diagnosis Date  . Chronic diastolic CHF (congestive heart failure) ([place]) 03/09/2017   Echo 1/18: Mild LVH, EF 60-65, Gr 2 DD, mild LAE; difficult study-no obvious wall motion abnormalities with Definity  . Coronary artery disease   . Diabetes mellitus without complication ([place])   . Heartburn   . High cholesterol   . Hyperlipidemia associated with type 2 diabetes mellitus ([place])   . Hypertension   . MI (myocardial infarction) ([place]) 07/2009   [facility] in [place], [place] for last 2, 1st one in [place], [place]; total of 6 stents, last one 02/2014   . Morbid obesity ([place]) 07/06/2016  . OSA (obstructive sleep apnea) 07/06/2016   Moderate with AHI 21.8/hr by PSG 08/2012 now on CPAP  . Thyroid disease    past  history    Patient Active Problem List   Diagnosis Date Noted  . Bilateral carpal tunnel syndrome 04/08/2017  . Chronic diastolic CHF (congestive heart failure) ([place]) 03/09/2017  . Erectile dysfunction 03/02/2017  . OSA on CPAP 07/06/2016  . Morbid obesity ([place]) 07/06/2016  . DM type 2 with diabetic peripheral neuropathy ([place]) 12/16/2015  . CAD (coronary artery disease) 09/30/2015  . Tobacco abuse 09/30/2015  . Essential hypertension 09/16/2015  . Hyperlipidemia   . History of MI (myocardial infarction) 07/30/2009    Past Surgical History:  Procedure Laterality Date  . APPENDECTOMY    . CARDIAC CATHETERIZATION N/A 09/16/2015   Procedure: Left Heart Cath and Coronary Angiography;  Surgeon: Lorretta Harp, MD;  Location: [facility];  Service: Cardiovascular;  Laterality: N/A;  . CARDIAC CATHETERIZATION N/A 09/16/2015   Procedure: Coronary Stent Intervention;  Surgeon: Lorretta Harp, MD;  Location: [facility];  Service: Cardiovascular;  Laterality: N/A;  . CORONARY STENT PLACEMENT         Home Medications    Prior to Admission medications   Medication Sig Start Date End Date Taking? Authorizing Provider  amLODipine (NORVASC) 5 MG tablet Take 1 tablet (5 mg total) by mouth daily. 04/08/17   Ladell Pier, MD  aspirin 81 MG tablet Take 1 tablet (81 mg total) by mouth daily. 11/17/16   Maren Reamer, MD  blood glucose  meter kit and supplies KIT Dispense based on patient and insurance preference. Use up to four times daily as directed. (FOR ICD-9 250.00, 250.01). 09/17/15   Rai, Ripudeep K, MD  buPROPion (WELLBUTRIN SR) 150 MG 12 hr tablet Take 1 tablet (150 mg total) by mouth 2 (two) times daily. 04/06/17   Ladell Pier, MD  clopidogrel (PLAVIX) 75 MG tablet Take 1 tablet (75 mg total) by mouth daily. 11/17/16   Langeland, Leda Quail, MD  empagliflozin (JARDIANCE) 10 MG TABS tablet Take 10 mg by mouth daily. 04/14/17   Ladell Pier, MD  ezetimibe  (ZETIA) 10 MG tablet Take 1 tablet (10 mg total) by mouth daily. 04/14/17   Ladell Pier, MD  famotidine (PEPCID) 20 MG tablet Take 1 tablet (20 mg total) by mouth 2 (two) times daily. 09/01/16   Maren Reamer, MD  furosemide (LASIX) 40 MG tablet Take 1 tablet (40 mg total) by mouth 3 (three) times a week. 02/10/17 05/11/17  Fay Records, MD  gabapentin (NEURONTIN) 300 MG capsule Take 3 capsules (900 mg total) by mouth 3 (three) times daily. 04/12/17   Ladell Pier, MD  glucose blood (TRUE METRIX BLOOD GLUCOSE TEST) test strip Use as instructed 04/12/17   Ladell Pier, MD  hydrochlorothiazide (HYDRODIURIL) 25 MG tablet Take 1 tablet (25 mg total) by mouth daily. 11/17/16   Maren Reamer, MD  ibuprofen (ADVIL,MOTRIN) 800 MG tablet TAKE 1 TABLET BY MOUTH EVERY 8 HOURS AS NEEDED FOR MODERATE PAIN (TAKE WITH FOOD). 10/28/16   Maren Reamer, MD  Insulin Glargine (LANTUS SOLOSTAR) 100 UNIT/ML Solostar Pen Inject 45 Units into the skin 2 (two) times daily. 04/14/17   Ladell Pier, MD  insulin lispro (HUMALOG) 100 UNIT/ML injection Inject 0.2 mLs (20 Units total) into the skin 3 (three) times daily with meals. 01/22/17   Funches, Adriana Mccallum, MD  Insulin Pen Needle 31G X 8 MM MISC Use as directed 08/06/16   Lottie Mussel T, MD  Insulin Syringe-Needle U-100 (B-D INS SYRINGE 0.5CC/31GX5/16) 31G X 5/16" 0.5 ML MISC 1 each by Does not apply route 3 (three) times daily. 01/21/17   Boykin Nearing, MD  Insulin Syringes, Disposable, U-100 0.5 ML MISC Take insulin as directed   Diagnosis: Insulin dependent diabetes mellitus ICD10 250 11/04/15   Langeland, Dawn T, MD  lisinopril (PRINIVIL,ZESTRIL) 10 MG tablet Take 1 tablet (10 mg total) by mouth 2 (two) times daily. 04/08/17   Ladell Pier, MD  metFORMIN (GLUCOPHAGE-XR) 500 MG 24 hr tablet TAKE 2 TABLETS BY MOUTH 2 TIMES DAILY WITH A MEAL. 03/17/17   Ladell Pier, MD  metoprolol tartrate (LOPRESSOR) 50 MG tablet Take 1 tablet (50  mg total) by mouth 2 (two) times daily. 11/17/16 03/09/17  Maren Reamer, MD  nitroGLYCERIN (NITROSTAT) 0.4 MG SL tablet Place 1 tablet (0.4 mg total) under the tongue every 5 (five) minutes as needed for chest pain. 09/17/15   Rai, Vernelle Emerald, MD  polyethylene glycol (MIRALAX) packet Take 17 g by mouth daily. 12/06/16   Kirichenko, Tatyana, PA-C  potassium chloride (K-DUR) 10 MEQ tablet Take 1 tablet (10 mEq total) by mouth daily. 02/08/17 05/09/17  Ladell Pier, MD  rosuvastatin (CRESTOR) 40 MG tablet Take 1 tablet (40 mg total) by mouth daily. 11/17/16 03/09/17  Maren Reamer, MD  TRUEPLUS LANCETS 28G MISC Use as directed 11/17/16   Maren Reamer, MD    Family History Family History  Problem Relation  Age of Onset  . Adopted: Yes  . Diabetes Maternal Grandmother     Social History Social History  Substance Use Topics  . Smoking status: Former Research scientist (life sciences)  . Smokeless tobacco: Never Used     Comment: quit 01/2017  . Alcohol use No     Allergies   Other and Levofloxacin   Review of Systems Review of Systems  A complete review of systems was obtained and all systems are negative except as noted in the HPI and PMH.    Physical Exam Updated Vital Signs BP (!) 106/57   Pulse 62   Temp 98.4 F (36.9 C) (Oral)   Resp 16   Ht _0  (1.676 m)   Wt 134.7 kg (297 lb)   SpO2 96%   BMI 47.94 kg/m   Physical Exam  Constitutional: He is oriented to person, place, and time. He appears well-developed and well-nourished. No distress.  obese  HENT:  Head: Normocephalic and atraumatic.  Mouth/Throat: Oropharynx is clear and moist.  Eyes: Pupils are equal, round, and reactive to light. Conjunctivae and EOM are normal.  Neck: Normal range of motion.  Cardiovascular: Normal rate, regular rhythm and intact distal pulses.   Pulmonary/Chest: Effort normal and breath sounds normal.  Abdominal: Soft. There is no tenderness.  Musculoskeletal: Normal range of motion. He exhibits  edema.  2+ pitting edema x4 extremities and sacrum.  Neurological: He is alert and oriented to person, place, and time.  Skin: He is not diaphoretic.  Psychiatric: He has a normal mood and affect.  Nursing note and vitals reviewed.    ED Treatments / Results  Labs (all labs ordered are listed, but only abnormal results are displayed) Labs Reviewed  BASIC METABOLIC PANEL - Abnormal; Notable for the following:       Result Value   Sodium 133 (*)    Chloride 97 (*)    Glucose, Bld 196 (*)    BUN 21 (*)    Creatinine, Ser 1.36 (*)    All other components within normal limits  CBC - Abnormal; Notable for the following:    WBC 11.8 (*)    RBC 3.84 (*)    Hemoglobin 11.9 (*)    HCT 36.4 (*)    All other components within normal limits  I-STAT TROPONIN, ED    EKG  EKG Interpretation None       Radiology Dg Chest 2 View  Result Date: 04/25/2017 CLINICAL DATA:  Bilateral upper extremity swelling. Shortness of breath. EXAM: CHEST  2 VIEW COMPARISON:  July 09, 2016 FINDINGS: Stable mild cardiomegaly. The hila and mediastinum are normal. No pulmonary nodules, masses, or focal infiltrates. Pleural thickening on the left is stable. IMPRESSION: No active cardiopulmonary disease. Electronically Signed   By: Dorise Bullion III M.D   On: 04/25/2017 19:26    Procedures Procedures (including critical care time)  Medications Ordered in ED Medications - No data to display   Initial Impression / Assessment and Plan / ED Course  I have reviewed the triage vital signs and the nursing notes.  Pertinent labs & imaging results that were available during my care of the patient were reviewed by me and considered in my medical decision making (see chart for details).     Vitals:   04/25/17 2215 04/25/17 2300 04/25/17 2345 04/26/17 0024  BP: 119/62 103/82 122/69 125/75  Pulse: 60 (!) 59 62 60  Resp: (!) _1 Temp:    98.7 F (37.1  C)  TempSrc:    Oral  SpO2: 96% 93% 99%  95%  Weight:      Height:        Medications  albuterol (PROVENTIL) (2.5 MG/3ML) 0.083% nebulizer solution (not administered)  furosemide (LASIX) tablet 60 mg (60 mg Oral Given 04/26/17 0033)    Marc Walker is 46 y.o. male presenting with edema and daytime somnolence.  He takes 40 mg of Lasix 3 times a week.  He has no associated chest pain, EKG, chest x-ray blood work reassuring.  He needs to increase his Lasix, he is noncompliant with his CPAP.  I have encouraged him to use his CPAP regularly, he has a sleep study scheduled at the beginning of November.  This patient does not drive.  He will follow closely with his primary care doctor next week and he understands return to the ED for any new or worsening symptoms.  This is a shared visit with the attending physician who personally evaluated the patient and agrees with the care plan.   Evaluation does not show pathology that would require ongoing emergent intervention or inpatient treatment. Pt is hemodynamically stable and mentating appropriately. Discussed findings and plan with patient/guardian, who agrees with care plan. All questions answered. Return precautions discussed and outpatient follow up given.      Final Clinical Impressions(s) / ED Diagnoses   Final diagnoses:  None    New Prescriptions New Prescriptions   No medications on file     Waynetta Pean 04/26/17 0041    Orlie Dakin, MD 04/26/17 [phone]

## 2017-04-26 MED ORDER — FUROSEMIDE 20 MG PO TABS
60.0000 mg | ORAL_TABLET | Freq: Once | ORAL | Status: AC
  Administered 2017-04-26: 60 mg via ORAL
  Filled 2017-04-26: qty 3

## 2017-04-26 MED ORDER — ALBUTEROL SULFATE (2.5 MG/3ML) 0.083% IN NEBU
INHALATION_SOLUTION | RESPIRATORY_TRACT | Status: AC
  Filled 2017-04-26: qty 18

## 2017-04-26 MED ORDER — FUROSEMIDE 20 MG PO TABS: 40.0000 mg | ORAL_TABLET | Freq: Every day | ORAL | 0 refills | Status: DC

## 2017-04-26 MED ORDER — IPRATROPIUM BROMIDE 0.02 % IN SOLN
RESPIRATORY_TRACT | Status: AC
  Filled 2017-04-26: qty 2.5

## 2017-04-26 MED ORDER — FUROSEMIDE 20 MG PO TABS
60.0000 mg | ORAL_TABLET | Freq: Every day | ORAL | 0 refills | Status: DC
Start: 2017-04-26 — End: 2017-04-26

## 2017-04-26 NOTE — Discharge Instructions (Addendum)
Hold your home dose of 40 mg of Lasix daily x4 days.   It is very important that you use your CPAP even if the mask is uncomfortable.  Please follow with your primary care doctor in the next 2 days for a check-up. They must obtain records for further management.   Do not hesitate to return to the Emergency Department for any new, worsening or concerning symptoms.

## 2017-04-30 ENCOUNTER — Telehealth: Payer: Self-pay | Admitting: Internal Medicine

## 2017-04-30 NOTE — Telephone Encounter (Signed)
If authorized pt would like rx sent to [facility] [address] in [place]

## 2017-04-30 NOTE — Telephone Encounter (Signed)
Returned pt call. Pt didn't answer

## 2017-04-30 NOTE — Telephone Encounter (Signed)
Pt called states he is a lot of pain, due to having numbness on fingers, if authorized patient would

## 2017-05-01 ENCOUNTER — Emergency Department (HOSPITAL_BASED_OUTPATIENT_CLINIC_OR_DEPARTMENT_OTHER)
Admission: EM | Admit: 2017-05-01 | Discharge: 2017-05-01 | Disposition: A | Discharge status: Home / Self Care | Payer: Self-pay | Attending: Emergency Medicine | Admitting: Emergency Medicine

## 2017-05-01 ENCOUNTER — Encounter (HOSPITAL_BASED_OUTPATIENT_CLINIC_OR_DEPARTMENT_OTHER): Payer: Self-pay | Admitting: *Deleted

## 2017-05-01 DIAGNOSIS — Z79899 Other long term (current) drug therapy: Secondary | ICD-10-CM | POA: Insufficient documentation

## 2017-05-01 DIAGNOSIS — I2510 Atherosclerotic heart disease of native coronary artery without angina pectoris: Secondary | ICD-10-CM | POA: Insufficient documentation

## 2017-05-01 DIAGNOSIS — I509 Heart failure, unspecified: Secondary | ICD-10-CM | POA: Insufficient documentation

## 2017-05-01 DIAGNOSIS — N289 Disorder of kidney and ureter, unspecified: Secondary | ICD-10-CM | POA: Insufficient documentation

## 2017-05-01 DIAGNOSIS — M2550 Pain in unspecified joint: Secondary | ICD-10-CM | POA: Insufficient documentation

## 2017-05-01 DIAGNOSIS — I110 Hypertensive heart disease with heart failure: Secondary | ICD-10-CM | POA: Insufficient documentation

## 2017-05-01 DIAGNOSIS — R609 Edema, unspecified: Secondary | ICD-10-CM

## 2017-05-01 DIAGNOSIS — Z791 Long term (current) use of non-steroidal anti-inflammatories (NSAID): Secondary | ICD-10-CM | POA: Insufficient documentation

## 2017-05-01 DIAGNOSIS — Z87891 Personal history of nicotine dependence: Secondary | ICD-10-CM | POA: Insufficient documentation

## 2017-05-01 DIAGNOSIS — Z7982 Long term (current) use of aspirin: Secondary | ICD-10-CM | POA: Insufficient documentation

## 2017-05-01 DIAGNOSIS — Z794 Long term (current) use of insulin: Secondary | ICD-10-CM | POA: Insufficient documentation

## 2017-05-01 DIAGNOSIS — E1121 Type 2 diabetes mellitus with diabetic nephropathy: Secondary | ICD-10-CM | POA: Insufficient documentation

## 2017-05-01 DIAGNOSIS — R2233 Localized swelling, mass and lump, upper limb, bilateral: Secondary | ICD-10-CM | POA: Insufficient documentation

## 2017-05-01 LAB — CBC
HCT: 35.7 % — ABNORMAL LOW (ref 39.0–52.0)
HEMOGLOBIN: 11.7 g/dL — AB (ref 13.0–17.0)
MCH: 31.3 pg (ref 26.0–34.0)
MCHC: 32.8 g/dL (ref 30.0–36.0)
MCV: 95.5 fL (ref 78.0–100.0)
Platelets: 210 10*3/uL (ref 150–400)
RBC: 3.74 MIL/uL — ABNORMAL LOW (ref 4.22–5.81)
RDW: 14.1 % (ref 11.5–15.5)
WBC: 10.8 10*3/uL — ABNORMAL HIGH (ref 4.0–10.5)

## 2017-05-01 LAB — COMPREHENSIVE METABOLIC PANEL
ALK PHOS: 60 U/L (ref 38–126)
ALT: 24 U/L (ref 17–63)
ANION GAP: 10 (ref 5–15)
AST: 32 U/L (ref 15–41)
Albumin: 4 g/dL (ref 3.5–5.0)
BILIRUBIN TOTAL: 0.6 mg/dL (ref 0.3–1.2)
BUN: 30 mg/dL — ABNORMAL HIGH (ref 6–20)
CALCIUM: 9.7 mg/dL (ref 8.9–10.3)
CO2: 28 mmol/L (ref 22–32)
CREATININE: 1.45 mg/dL — AB (ref 0.61–1.24)
Chloride: 97 mmol/L — ABNORMAL LOW (ref 101–111)
GFR calc non Af Amer: 56 mL/min — ABNORMAL LOW (ref >60)
Glucose, Bld: 181 mg/dL — ABNORMAL HIGH (ref 65–99)
Potassium: 3.7 mmol/L (ref 3.5–5.1)
SODIUM: 135 mmol/L (ref 135–145)
Total Protein: 7.5 g/dL (ref 6.5–8.1)

## 2017-05-01 NOTE — Discharge Instructions (Signed)
It was our pleasure to provide your ER care today - we hope that you feel better.  From today's lab tests, your kidney function is mildly elevated (creatinine 1.45) - DO NOT TAKE any anti-inflammatory type of pain medication such as ibuprofen/motrin or naprosyn/aleve.  You may take acetaminophen as need for pain.  For swelling, limit salt intake - do not add any extra salt to your foods. Also, follow a heart health eating plan to help facilitate your health and gradual weight loss - this will also help your swelling and breathing.   Follow up with primary care doctor this coming week - have your kidney tests rechecked then.  Also follow up with your cardiologist in the next 1-2 weeks to discuss possible medication adjustments.  Call your doctors offices this Monday to arrange these appointments.   Return to ER if worse, new symptoms, increased trouble breathing, fevers, other concern.

## 2017-05-01 NOTE — ED Notes (Signed)
Attempted x 2 to obtain blood; unable to.

## 2017-05-01 NOTE — ED Provider Notes (Signed)
[facility] EMERGENCY DEPARTMENT Provider Note   CSN: [id number] Arrival date & time: 05/01/17  1444     History   Chief Complaint Chief Complaint  Patient presents with  . Hand Problem    HPI Marc Walker is a 46 y.o. male.  Patient c/o bil hand swelling, and multiple joint pains for several months. Symptoms constant, persistent, moderate, slowly worse. Denies acute or abrupt change today. No focal joint pain or swelling. No fever or chills. States compliant w normal meds. Denies any new med use.  Has seen ED and pcp w same, denies specific dx.  No hx liver or kidney disease. Denies hx RA or other rheumatologic disease.  No pnd. No increased sob or unusual/new doe.    The history is provided by the patient.    Past Medical History:  Diagnosis Date  . Chronic diastolic CHF (congestive heart failure) ([place]) 03/09/2017   Echo 1/18: Mild LVH, EF 60-65, Gr 2 DD, mild LAE; difficult study-no obvious wall motion abnormalities with Definity  . Coronary artery disease   . Diabetes mellitus without complication ([place])   . Heartburn   . High cholesterol   . Hyperlipidemia associated with type 2 diabetes mellitus ([place])   . Hypertension   . MI (myocardial infarction) ([place]) 07/2009   [facility] in [place], [place] for last 2, 1st one in [place], [place]; total of 6 stents, last one 02/2014   . Morbid obesity ([place]) 07/06/2016  . OSA (obstructive sleep apnea) 07/06/2016   Moderate with AHI 21.8/hr by PSG 08/2012 now on CPAP  . Thyroid disease    past history    Patient Active Problem List   Diagnosis Date Noted  . Bilateral carpal tunnel syndrome 04/08/2017  . Chronic diastolic CHF (congestive heart failure) ([place]) 03/09/2017  . Erectile dysfunction 03/02/2017  . OSA on CPAP 07/06/2016  . Morbid obesity ([place]) 07/06/2016  . DM type 2 with diabetic peripheral neuropathy ([place]) 12/16/2015  . CAD (coronary artery disease) 09/30/2015  . Tobacco abuse 09/30/2015  . Essential  hypertension 09/16/2015  . Hyperlipidemia   . History of MI (myocardial infarction) 07/30/2009    Past Surgical History:  Procedure Laterality Date  . APPENDECTOMY    . CARDIAC CATHETERIZATION N/A 09/16/2015   Procedure: Left Heart Cath and Coronary Angiography;  Surgeon: Lorretta Harp, MD;  Location: [facility];  Service: Cardiovascular;  Laterality: N/A;  . CARDIAC CATHETERIZATION N/A 09/16/2015   Procedure: Coronary Stent Intervention;  Surgeon: Lorretta Harp, MD;  Location: [facility];  Service: Cardiovascular;  Laterality: N/A;  . CORONARY STENT PLACEMENT         Home Medications    Prior to Admission medications   Medication Sig Start Date End Date Taking? Authorizing Provider  amLODipine (NORVASC) 5 MG tablet Take 1 tablet (5 mg total) by mouth daily. 04/08/17   Ladell Pier, MD  aspirin 81 MG tablet Take 1 tablet (81 mg total) by mouth daily. 11/17/16   Maren Reamer, MD  blood glucose meter kit and supplies KIT Dispense based on patient and insurance preference. Use up to four times daily as directed. (FOR ICD-9 250.00, 250.01). 09/17/15   Rai, Ripudeep K, MD  buPROPion (WELLBUTRIN SR) 150 MG 12 hr tablet Take 1 tablet (150 mg total) by mouth 2 (two) times daily. 04/06/17   Ladell Pier, MD  clopidogrel (PLAVIX) 75 MG tablet Take 1 tablet (75 mg total) by mouth daily. 11/17/16  Langeland, Dawn T, MD  empagliflozin (JARDIANCE) 10 MG TABS tablet Take 10 mg by mouth daily. 04/14/17   Ladell Pier, MD  ezetimibe (ZETIA) 10 MG tablet Take 1 tablet (10 mg total) by mouth daily. 04/14/17   Ladell Pier, MD  famotidine (PEPCID) 20 MG tablet Take 1 tablet (20 mg total) by mouth 2 (two) times daily. 09/01/16   Maren Reamer, MD  furosemide (LASIX) 20 MG tablet Take 2 tablets (40 mg total) by mouth daily. 04/26/17 04/30/17  Pisciotta, Elmyra Ricks, PA-C  gabapentin (NEURONTIN) 300 MG capsule Take 3 capsules (900 mg total) by mouth 3 (three) times  daily. 04/12/17   Ladell Pier, MD  glucose blood (TRUE METRIX BLOOD GLUCOSE TEST) test strip Use as instructed 04/12/17   Ladell Pier, MD  hydrochlorothiazide (HYDRODIURIL) 25 MG tablet Take 1 tablet (25 mg total) by mouth daily. 11/17/16   Maren Reamer, MD  ibuprofen (ADVIL,MOTRIN) 800 MG tablet TAKE 1 TABLET BY MOUTH EVERY 8 HOURS AS NEEDED FOR MODERATE PAIN (TAKE WITH FOOD). 10/28/16   Maren Reamer, MD  Insulin Glargine (LANTUS SOLOSTAR) 100 UNIT/ML Solostar Pen Inject 45 Units into the skin 2 (two) times daily. 04/14/17   Ladell Pier, MD  insulin lispro (HUMALOG) 100 UNIT/ML injection Inject 0.2 mLs (20 Units total) into the skin 3 (three) times daily with meals. 01/22/17   Funches, Adriana Mccallum, MD  Insulin Pen Needle 31G X 8 MM MISC Use as directed 08/06/16   Lottie Mussel T, MD  Insulin Syringe-Needle U-100 (B-D INS SYRINGE 0.5CC/31GX5/16) 31G X 5/16" 0.5 ML MISC 1 each by Does not apply route 3 (three) times daily. 01/21/17   Boykin Nearing, MD  Insulin Syringes, Disposable, U-100 0.5 ML MISC Take insulin as directed   Diagnosis: Insulin dependent diabetes mellitus ICD10 250 11/04/15   Langeland, Dawn T, MD  lisinopril (PRINIVIL,ZESTRIL) 10 MG tablet Take 1 tablet (10 mg total) by mouth 2 (two) times daily. 04/08/17   Ladell Pier, MD  metFORMIN (GLUCOPHAGE-XR) 500 MG 24 hr tablet TAKE 2 TABLETS BY MOUTH 2 TIMES DAILY WITH A MEAL. 03/17/17   Ladell Pier, MD  metoprolol tartrate (LOPRESSOR) 50 MG tablet Take 1 tablet (50 mg total) by mouth 2 (two) times daily. 11/17/16 03/09/17  Maren Reamer, MD  nitroGLYCERIN (NITROSTAT) 0.4 MG SL tablet Place 1 tablet (0.4 mg total) under the tongue every 5 (five) minutes as needed for chest pain. 09/17/15   Rai, Vernelle Emerald, MD  polyethylene glycol (MIRALAX) packet Take 17 g by mouth daily. 12/06/16   Kirichenko, Tatyana, PA-C  potassium chloride (K-DUR) 10 MEQ tablet Take 1 tablet (10 mEq total) by mouth daily. 02/08/17  05/09/17  Ladell Pier, MD  rosuvastatin (CRESTOR) 40 MG tablet Take 1 tablet (40 mg total) by mouth daily. 11/17/16 03/09/17  Maren Reamer, MD  TRUEPLUS LANCETS 28G MISC Use as directed 11/17/16   Maren Reamer, MD    Family History Family History  Problem Relation Age of Onset  . Adopted: Yes  . Diabetes Maternal Grandmother     Social History Social History  Substance Use Topics  . Smoking status: Former Research scientist (life sciences)  . Smokeless tobacco: Never Used     Comment: quit 01/2017  . Alcohol use No     Allergies   Other and Levofloxacin   Review of Systems Review of Systems  Constitutional: Negative for fever.  HENT: Negative for sore throat.   Eyes: Negative for  redness.  Respiratory: Negative for shortness of breath.   Cardiovascular: Negative for chest pain.  Gastrointestinal: Negative for abdominal pain, diarrhea and vomiting.  Endocrine: Negative for polyuria.  Genitourinary: Negative for dysuria and flank pain.  Musculoskeletal: Positive for arthralgias. Negative for back pain and neck pain.  Skin: Negative for rash.  Neurological: Negative for headaches.  Hematological: Does not bruise/bleed easily.  Psychiatric/Behavioral: Negative for confusion.     Physical Exam Updated Vital Signs BP 106/78 (BP Location: Left Arm)   Pulse 73   Temp 97.6 F (36.4 C) (Oral)   Resp 20   Ht 1.676 m ('5\' 6"' )   Wt 134.7 kg (297 lb)   SpO2 96%   BMI 47.94 kg/m   Physical Exam  Constitutional: He appears well-developed and well-nourished. No distress.  HENT:  Mouth/Throat: Oropharynx is clear and moist.  Eyes: Conjunctivae are normal.  Neck: Neck supple. No JVD present. No tracheal deviation present.  Cardiovascular: Normal rate, regular rhythm, normal heart sounds and intact distal pulses.  Exam reveals no friction rub.   No murmur heard. Pulmonary/Chest: Effort normal and breath sounds normal. No accessory muscle usage. No respiratory distress.  Abdominal: Soft.  Bowel sounds are normal. He exhibits no distension. There is no tenderness.  No hsm. Obese.  Genitourinary:  Genitourinary Comments: No cva tenderness  Musculoskeletal:  Edema to bil hands and feet/ankles. Distal pulses palp bil.   Neurological: He is alert.  Skin: Skin is warm and dry. No rash noted. He is not diaphoretic.  Psychiatric: He has a normal mood and affect.  Nursing note and vitals reviewed.    ED Treatments / Results  Labs (all labs ordered are listed, but only abnormal results are displayed) Results for orders placed or performed during the hospital encounter of 05/01/17  CBC  Result Value Ref Range   WBC 10.8 (H) 4.0 - 10.5 K/uL   RBC 3.74 (L) 4.22 - 5.81 MIL/uL   Hemoglobin 11.7 (L) 13.0 - 17.0 g/dL   HCT 35.7 (L) 39.0 - 52.0 %   MCV 95.5 78.0 - 100.0 fL   MCH 31.3 26.0 - 34.0 pg   MCHC 32.8 30.0 - 36.0 g/dL   RDW 14.1 11.5 - 15.5 %   Platelets 210 150 - 400 K/uL  Comprehensive metabolic panel  Result Value Ref Range   Sodium 135 135 - 145 mmol/L   Potassium 3.7 3.5 - 5.1 mmol/L   Chloride 97 (L) 101 - 111 mmol/L   CO2 28 22 - 32 mmol/L   Glucose, Bld 181 (H) 65 - 99 mg/dL   BUN 30 (H) 6 - 20 mg/dL   Creatinine, Ser 1.45 (H) 0.61 - 1.24 mg/dL   Calcium 9.7 8.9 - 10.3 mg/dL   Total Protein 7.5 6.5 - 8.1 g/dL   Albumin 4.0 3.5 - 5.0 g/dL   AST 32 15 - 41 U/L   ALT 24 17 - 63 U/L   Alkaline Phosphatase 60 38 - 126 U/L   Total Bilirubin 0.6 0.3 - 1.2 mg/dL   GFR calc non Af Amer 56 (L) >60 mL/min   GFR calc Af Amer >60 >60 mL/min   Anion gap 10 5 - 15   Dg Chest 2 View  Result Date: 04/25/2017 CLINICAL DATA:  Bilateral upper extremity swelling. Shortness of breath. EXAM: CHEST  2 VIEW COMPARISON:  July 09, 2016 FINDINGS: Stable mild cardiomegaly. The hila and mediastinum are normal. No pulmonary nodules, masses, or focal infiltrates. Pleural thickening on the  left is stable. IMPRESSION: No active cardiopulmonary disease. Electronically Signed   By:  Dorise Bullion III M.D   On: 04/25/2017 19:26    EKG  EKG Interpretation None       Radiology No results found.  Procedures Procedures (including critical care time)  Medications Ordered in ED Medications - No data to display   Initial Impression / Assessment and Plan / ED Course  I have reviewed the triage vital signs and the nursing notes.  Pertinent labs & imaging results that were available during my care of the patient were reviewed by me and considered in my medical decision making (see chart for details).  Labs sent.   Reviewed nursing notes and prior charts for additional history.   Patient indicates has recently been taking increased/regular doses of motrin 800 mg for his chr pain.  Cr is mildly increased from prior.  Pt instructed to refrain from any nsaid use.   For chr edema, rec low sodium diet, mod wt loss program, and close pcp f/u.     Final Clinical Impressions(s) / ED Diagnoses   Final diagnoses:  None    New Prescriptions New Prescriptions   No medications on file     Lajean Saver, MD 05/01/17 [phone]

## 2017-05-01 NOTE — ED Notes (Signed)
Attempt x 2 to obtain labs without success.

## 2017-05-01 NOTE — ED Triage Notes (Signed)
Pt reports bilateral hand swelling x 2 weeks. Has been seen and treated for same.

## 2017-05-04 ENCOUNTER — Telehealth: Payer: Self-pay | Admitting: *Deleted

## 2017-05-04 ENCOUNTER — Ambulatory Visit (HOSPITAL_BASED_OUTPATIENT_CLINIC_OR_DEPARTMENT_OTHER): Payer: Self-pay

## 2017-05-04 ENCOUNTER — Ambulatory Visit (HOSPITAL_BASED_OUTPATIENT_CLINIC_OR_DEPARTMENT_OTHER): Payer: Self-pay | Attending: Cardiology | Admitting: Cardiology

## 2017-05-04 DIAGNOSIS — R0902 Hypoxemia: Secondary | ICD-10-CM | POA: Insufficient documentation

## 2017-05-04 DIAGNOSIS — G4733 Obstructive sleep apnea (adult) (pediatric): Secondary | ICD-10-CM | POA: Insufficient documentation

## 2017-05-05 ENCOUNTER — Ambulatory Visit (HOSPITAL_BASED_OUTPATIENT_CLINIC_OR_DEPARTMENT_OTHER): Payer: Self-pay | Attending: Cardiology | Admitting: Cardiology

## 2017-05-05 DIAGNOSIS — G4733 Obstructive sleep apnea (adult) (pediatric): Secondary | ICD-10-CM | POA: Insufficient documentation

## 2017-05-05 DIAGNOSIS — G4719 Other hypersomnia: Secondary | ICD-10-CM

## 2017-05-06 ENCOUNTER — Ambulatory Visit (INDEPENDENT_AMBULATORY_CARE_PROVIDER_SITE_OTHER): Payer: Self-pay | Admitting: Cardiology

## 2017-05-06 ENCOUNTER — Encounter: Payer: Self-pay | Admitting: Cardiology

## 2017-05-06 VITALS — HR 62 | Ht 65.0 in | Wt 304.1 lb

## 2017-05-06 DIAGNOSIS — R609 Edema, unspecified: Secondary | ICD-10-CM

## 2017-05-06 MED ORDER — FUROSEMIDE 80 MG PO TABS: 80.0000 mg | ORAL_TABLET | Freq: Two times a day (BID) | ORAL | 3 refills | Status: DC

## 2017-05-06 MED ORDER — POTASSIUM CHLORIDE ER 20 MEQ PO TBCR: 40.0000 meq | EXTENDED_RELEASE_TABLET | Freq: Every day | ORAL | 2 refills | Status: DC

## 2017-05-06 MED ORDER — POTASSIUM CHLORIDE ER 20 MEQ PO TBCR
40.0000 meq | EXTENDED_RELEASE_TABLET | Freq: Every day | ORAL | 2 refills | Status: DC
Start: 2017-05-06 — End: 2017-05-06

## 2017-05-06 NOTE — Progress Notes (Signed)
05/06/2017 Marc Walker   August 28, 1970  [id number]  Primary Physician Ladell Pier, MD Primary Cardiologist: Dr. Harrington Challenger [facility]: Dr. Radford Pax   Reason for Visit/CC: fluid retention and exertional dyspnea.    HPI:  Marc Walker is a 46 y.o. male with a hx of CAD s/pprior myocardial infarction, s/p multipleprior stents placedat outside institutions, DM, HTN, HL, tobacco abuse, OSA, diastolic HF. He was admitted to [facility] in 3/17 with unstable angina. LHC demonstrated 80% mid RCA stenosis treated with DES, 90% proximal LAD and 70% mid LAD stenosis involving the previous stent treated with DES, 20% RPDA in-stent restenosis treated with angioplasty. The stent in OM2 was patent. Residual disease included 90% D2 stenosis and 70% mid LCx stenosis, treated medically. Last nuclear stress test in 9/17 demonstrated mild ischemia in the distal OM or PLA branch territory. This was overall felt to be low risk. Medical therapy was continued.    He is followed primarily by Dr. Harrington Challenger. Dr. Radford Pax follows his OSA in sleep clinic. He was last seen in general cardiology clinic by [name] PA-C on 03/09/17 and was doing well. He advised that he f/u with Dr. Harrington Challenger in 6 months.   Since his last office visit, he has been bothered by fluid retention, upper and lower extremity edema and exertional dyspnea and orthopnea. He denies resting dyspnea. No CP. He was seen in the [facility] ED. However no BNP was checked. Troponin was negative. He was given a Rx for 40 mg of Lasix x 4 days and instructed to f/u in our office. He saw no improvement with the 40 mg of Lasix. No increased UOP from his baseline.   He denies CP. His is visibly volume overloaded. However no increased work of breathing. He complains of weight gain and his clothes fitting tighter.   Current Meds  Medication Sig  . amLODipine (NORVASC) 5 MG tablet Take 1 tablet (5 mg total) by mouth daily.  Marland Kitchen aspirin 81 MG tablet Take 1 tablet (81 mg  total) by mouth daily.  . blood glucose meter kit and supplies KIT Dispense based on patient and insurance preference. Use up to four times daily as directed. (FOR ICD-9 250.00, 250.01).  Marland Kitchen buPROPion (WELLBUTRIN SR) 150 MG 12 hr tablet Take 1 tablet (150 mg total) by mouth 2 (two) times daily.  . clopidogrel (PLAVIX) 75 MG tablet Take 1 tablet (75 mg total) by mouth daily.  . empagliflozin (JARDIANCE) 10 MG TABS tablet Take 10 mg by mouth daily.  Marland Kitchen ezetimibe (ZETIA) 10 MG tablet Take 1 tablet (10 mg total) by mouth daily.  . famotidine (PEPCID) 20 MG tablet Take 1 tablet (20 mg total) by mouth 2 (two) times daily.  Marland Kitchen gabapentin (NEURONTIN) 300 MG capsule Take 3 capsules (900 mg total) by mouth 3 (three) times daily.  Marland Kitchen glucose blood (TRUE METRIX BLOOD GLUCOSE TEST) test strip Use as instructed  . ibuprofen (ADVIL,MOTRIN) 800 MG tablet TAKE 1 TABLET BY MOUTH EVERY 8 HOURS AS NEEDED FOR MODERATE PAIN (TAKE WITH FOOD).  . Insulin Glargine (LANTUS SOLOSTAR) 100 UNIT/ML Solostar Pen Inject 45 Units into the skin 2 (two) times daily.  . insulin lispro (HUMALOG) 100 UNIT/ML injection Inject 0.2 mLs (20 Units total) into the skin 3 (three) times daily with meals.  . Insulin Pen Needle 31G X 8 MM MISC Use as directed  . Insulin Syringe-Needle U-100 (B-D INS SYRINGE 0.5CC/31GX5/16) 31G X 5/16" 0.5 ML MISC 1 each by Does not apply  route 3 (three) times daily.  . Insulin Syringes, Disposable, U-100 0.5 ML MISC Take insulin as directed   Diagnosis: Insulin dependent diabetes mellitus ICD10 250  . lisinopril (PRINIVIL,ZESTRIL) 10 MG tablet Take 1 tablet (10 mg total) by mouth 2 (two) times daily.  . metFORMIN (GLUCOPHAGE-XR) 500 MG 24 hr tablet TAKE 2 TABLETS BY MOUTH 2 TIMES DAILY WITH A MEAL.  . nitroGLYCERIN (NITROSTAT) 0.4 MG SL tablet Place 1 tablet (0.4 mg total) under the tongue every 5 (five) minutes as needed for chest pain.  . polyethylene glycol (MIRALAX) packet Take 17 g by mouth daily.  .  Potassium Chloride ER 20 MEQ TBCR Take 40 mEq daily by mouth.  . TRUEPLUS LANCETS 28G MISC Use as directed  . [DISCONTINUED] hydrochlorothiazide (HYDRODIURIL) 25 MG tablet Take 1 tablet (25 mg total) by mouth daily.  . [DISCONTINUED] potassium chloride (K-DUR) 10 MEQ tablet Take 1 tablet (10 mEq total) by mouth daily.  . [DISCONTINUED] potassium chloride 20 MEQ TBCR Take 40 mEq daily by mouth.   Allergies  Allergen Reactions  . 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  Pt only wants pain medicine if absolutely necessary.  . Levofloxacin Rash    Rash on back (not sun exposed)and hypersensitivity to skin on exposed skin/arm   Past Medical History:  Diagnosis Date  . Chronic diastolic CHF (congestive heart failure) ([place]) 03/09/2017   Echo 1/18: Mild LVH, EF 60-65, Gr 2 DD, mild LAE; difficult study-no obvious wall motion abnormalities with Definity  . Coronary artery disease   . Diabetes mellitus without complication ([place])   . Heartburn   . High cholesterol   . Hyperlipidemia associated with type 2 diabetes mellitus ([place])   . Hypertension   . MI (myocardial infarction) ([place]) 07/2009   [facility] in [place], [place] for last 2, 1st one in [place], [place]; total of 6 stents, last one 02/2014   . Morbid obesity ([place]) 07/06/2016  . OSA (obstructive sleep apnea) 07/06/2016   Moderate with AHI 21.8/hr by PSG 08/2012 now on CPAP  . Thyroid disease    past history   Family History  Adopted: Yes  Problem Relation Age of Onset  . Diabetes Maternal Grandmother    Past Surgical History:  Procedure Laterality Date  . APPENDECTOMY    . CORONARY STENT PLACEMENT     Social History   Socioeconomic History  . Marital status: Divorced    Spouse name: Not on file  . Number of children: Not on file  . Years of education: Not on file  . Highest education level: Not on file  Social Needs  . Financial resource strain: Not on file  . Food insecurity - worry: Not on file    . Food insecurity - inability: Not on file  . Transportation needs - medical: Not on file  . Transportation needs - non-medical: Not on file  Occupational History  . Occupation: Biochemist, clinical  Tobacco Use  . Smoking status: Former Research scientist (life sciences)  . Smokeless tobacco: Never Used  . Tobacco comment: quit 01/2017  Substance and Sexual Activity  . Alcohol use: No    Alcohol/week: 0.0 oz  . Drug use: No  . Sexual activity: Not on file  Other Topics Concern  . Not on file  Social History Narrative   Adopted. Very little info on biological parents, ?diabetes.   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  Cardiovascular: negative for chest pain, dyspnea on exertion, edema, orthopnea, palpitations, paroxysmal nocturnal dyspnea or shortness of breath Dermatological: negative for rash Respiratory: negative for cough or wheezing Urologic: negative for hematuria Abdominal: negative for nausea, vomiting, diarrhea, bright red blood per rectum, melena, or hematemesis Neurologic: negative for visual changes, syncope, or dizziness All other systems reviewed and are otherwise negative except as noted above.   Physical Exam:  Pulse 62, height '5\' 5"'  (1.651 m), weight (!) 304 lb 1.9 oz (137.9 kg).  General appearance: alert, cooperative and no distress Neck: no carotid bruit and no JVD Lungs: clear to auscultation bilaterally Heart: regular rate and rhythm, S1, S2 normal, no murmur, click, rub or gallop Extremities: extremities normal, atraumatic, no cyanosis or edema Pulses: 2+ and symmetric Skin: Skin color, texture, turgor normal. No rashes or lesions Neurologic: Grossly normal  EKG not performed -- personally reviewed   ASSESSMENT AND PLAN:   CAD: h/o stenting as outlined above in HPI. He denies any chest pain.   Chronic Diastolic HF: pt is volume overloaded. Poor response to low dose PO Lasix. We will check  a BNP today for baseline assessment as well as a BMP. Stop HCTZ. Add back PO lasix but at a higher dose, 80 mg BID. Increase Kdur to 40 Meq daily. F/u BNP and BMP in 1 week and f/u with APP for repeat assessment. If no improvement with home diuretics, then we may need to consider admission for IV diuretics.   OSA:  On CPAP therapy. Followed by Dr. Radford Pax.   HLD: on statin therapy with Crestor   Follow-Up  In 1 week with APP to reassess response to PO diuretics. F/u BMP and BNP in 1 week. Pt will go to ED if symptoms worsen or if development of significant resting dyspnea.   Pollyanna Levay Ladoris Gene, MHS [facility] 05/06/2017 4:11 PM

## 2017-05-06 NOTE — Patient Instructions (Addendum)
Your physician has recommended you make the following change in your medication:  INCREASE FUROSEMIDE TO 80 MG TWICE DAILY INCREASE POTASSIUM T 40 MEQ EVERY DAY  STOP  HCTZ Your physician recommends that you return for lab work in:   TODAY  BMET AND BNP  REPEAT BNP IN 7 DAYS   Your physician recommends that you schedule a follow-up appointment in: WEEK WITH APP

## 2017-05-07 ENCOUNTER — Telehealth: Payer: Self-pay | Admitting: Physician Assistant

## 2017-05-07 LAB — BASIC METABOLIC PANEL
BUN/Creatinine Ratio: 11 (ref 9–20)
BUN: 19 mg/dL (ref 6–24)
CHLORIDE: 97 mmol/L (ref 96–106)
CO2: 24 mmol/L (ref 20–29)
Calcium: 9.8 mg/dL (ref 8.7–10.2)
Creatinine, Ser: 1.77 mg/dL — ABNORMAL HIGH (ref 0.76–1.27)
GFR calc Af Amer: 52 mL/min/{1.73_m2} — ABNORMAL LOW (ref >59)
GFR, EST NON AFRICAN AMERICAN: 45 mL/min/{1.73_m2} — AB (ref >59)
Glucose: 115 mg/dL — ABNORMAL HIGH (ref 65–99)
POTASSIUM: 4.2 mmol/L (ref 3.5–5.2)
Sodium: 139 mmol/L (ref 134–144)

## 2017-05-07 LAB — PRO B NATRIURETIC PEPTIDE: NT-PRO BNP: 95 pg/mL (ref 0–121)

## 2017-05-07 MED FILL — ?FUROSEMIDE 80MG TABLET: 80 | 30 days supply | Qty: 60 | Fill #0

## 2017-05-07 MED FILL — POTASSIUM CL ER 20 MEQ TAB: 20 | 30 days supply | Qty: 60 | Fill #0

## 2017-05-07 NOTE — Telephone Encounter (Signed)
New message    *STAT* If patient is at the pharmacy, call can be transferred to refill team.   1. Which medications need to be refilled? (please list name of each medication and dose if known) Potassium Chloride ER 20 MEQ TBCR  2. Which pharmacy/location (including street and city if local pharmacy) is [facility]  3. Do they need a 30 day or 90 day supply? 30

## 2017-05-07 NOTE — Telephone Encounter (Signed)
I called patient to make him aware that an updated rx was sent to his pharmacy at yesterdays office visit. He did not answer and did not have a vm. I will try again later.

## 2017-05-10 ENCOUNTER — Encounter: Payer: Self-pay | Admitting: Cardiology

## 2017-05-10 NOTE — Telephone Encounter (Signed)
Spoke with patient and he stated that he has picked the potassium up at the pharmacy. Patient thanked me for the call.

## 2017-05-11 ENCOUNTER — Telehealth: Payer: Self-pay | Admitting: Cardiology

## 2017-05-11 ENCOUNTER — Telehealth: Payer: Self-pay | Admitting: *Deleted

## 2017-05-11 NOTE — Telephone Encounter (Signed)
Patient said that he has already spoke with Marc Walker about his lab work results and understands.

## 2017-05-11 NOTE — Telephone Encounter (Signed)
New message ° ° ° °Patient calling for sleep study results. Please call °

## 2017-05-11 NOTE — Telephone Encounter (Signed)
Return call: Reached out to patient to let him know that his results are not in yet. Patient understands the CPAP assistant will call him when his results are in.

## 2017-05-11 NOTE — Telephone Encounter (Signed)
[facility].

## 2017-05-12 ENCOUNTER — Encounter: Payer: Self-pay | Admitting: Physician Assistant

## 2017-05-12 NOTE — Telephone Encounter (Signed)
-----   Message from Quintella Reichertraci R Turner, MD sent at 05/12/2017 12:58 PM EST ----- Please let patient know that he does have shortened sleep latency but not sure of accuracy of the study due to difficult PAP titration the night before.  I would like to try him on new CPAP setting first and then see how he does over the next few weeks with his daytime sleepiness - followup with me in 10 weeks

## 2017-05-12 NOTE — Progress Notes (Addendum)
Cardiology Office Note    Date:  05/13/2017  ID:  Marc Walker, DOB 1971/01/08, MRN [id number] PCP:  Ladell Pier, MD  Cardiologist:  Ross/Turner (OSA)   Chief Complaint: f/u fluid retention  History of Present Illness:  Marc Walker is a 46 y.o. male with history of CAD s/pprior myocardial infarction, s/p multipleprior stents placedat outside institutions with last intervention 08/2015 at [facility], DM, HTN, HL, tobacco abuse, OSA, chronic diastolic HF, morbid obesity who presents for f/u. To recap history, he was previously admitted to [facility] in 08/2015 with unstable angina. LHC demonstrated 80% mid RCA stenosis treated with DES, 90% proximal LAD and 70% mid LAD stenosis involving the previous stent treated with DES, 20% RPDA in-stent restenosis treated with angioplasty. The stent in OM2 was patent. Residual disease included 90% D2 stenosis and 70% mid LCx stenosis, treated medically. Last nuclear stress test in 9/17 demonstrated mild ischemia in the distal OM or PLA branch territory, felt to be low risk, medical therapy was continued. Last echo 06/2016 was technically limited, EF 60-65%, grade 2 DD.   He has been experiencing fluid retention lately in the setting of diffuse arthralgias, particularly upper extremities. He was originally seen in the ED 04/25/17 with bilateral hand swelling and worsening upper extremity joint pains for several months. He had been taking more Motrin lately. Workup was nonacute. He was found to have elevated creatinine and was advised to refrain from NSAIDs and follow low sodium diet and weight loss. He was seen again in the ED 05/01/17 with increasing edema. ED note from 04/25/17 indicates recommendation to increase his Lasix and be compliant with CPAP. Troponin was negative, labs otherwise showed persistent leukocytosis 10.8, mild anemia down to 11.7 (previously 12-14), albumin 4.0, no BNP at that time.  He was seen by Lyda Jester on 05/06/17  with complaints of worsening U/LE edema, DOE and orthopnea.  When seen by [facility] 05/06/17 he was visibly volume overloaded. HCTZ was stopped and Lasix was started at 55m BID with increase in KCl to 487m daily. Labs that day showed further decline in renal function with BUN 19, Cr 1.77. Prior values include 0.70 in 09/2016, 1.36 in 03/2017, then 1.45 on 05/01/17 then 1.77 on 05/06/17. pBNP was normal.   He returns for follow-up today with his dad. He remains volume overloaded with diffuse swelling throughout his legs, hands, arms, even shoulders and bac.  Has been unable to shave in a week because it is uncomfortable to raise his arms. Dyspnea is better and urine output increased per patient, but he reports none of his clothes now fit. No chest pain. He says he has been "trying" to follow a low sodium diet and fluid restriction but I'm not sure based on his tone if he's been compliant. O2 sat was 85% upon ambulation to clinic and staff felt he was visibly SOB walking in. This came up to 93% sitting on the table. Initial BP recorded as 88/56, recheck 110/60 by me.   Past Medical History:  Diagnosis Date  . Chronic diastolic CHF (congestive heart failure) (HCAlvarado9/04/2017   Echo 1/18: Mild LVH, EF 60-65, Gr 2 DD, mild LAE; difficult study-no obvious wall motion abnormalities with Definity  . Coronary artery disease    a. s/pprior myocardial infarction, s/p multipleprior stents placedat outside institutions [facility] . Diabetes mellitus (HCSan Miguel  . Heartburn   . Hyperlipidemia associated with type 2 diabetes mellitus ([facility]  .  Hypertension   . MI (myocardial infarction) ([place]) 07/2009   [facility] in [place], [place] for last 2, 1st one in [place], [place]; total of 6 stents, last one 02/2014   . Morbid obesity ([place]) 07/06/2016  . OSA (obstructive sleep apnea) 07/06/2016   Moderate with AHI 21.8/hr by PSG 08/2012 now on CPAP  . Thyroid disease    past history  . Tobacco  abuse     Past Surgical History:  Procedure Laterality Date  . APPENDECTOMY    . CARDIAC CATHETERIZATION N/A 09/16/2015   Procedure: Left Heart Cath and Coronary Angiography;  Surgeon: Lorretta Harp, MD;  Location: [place] CV [facility];  Service: Cardiovascular;  Laterality: N/A;  . CARDIAC CATHETERIZATION N/A 09/16/2015   Procedure: Coronary Stent Intervention;  Surgeon: Lorretta Harp, MD;  Location: [place] CV [facility];  Service: Cardiovascular;  Laterality: N/A;  . CORONARY STENT PLACEMENT      Current Medications: Current Outpatient Medications  Medication Sig Dispense Refill  . amLODipine (NORVASC) 5 MG tablet Take 1 tablet (5 mg total) by mouth daily. 30 tablet 6  . aspirin 81 MG tablet Take 1 tablet (81 mg total) by mouth daily. 90 tablet 3  . blood glucose meter kit and supplies KIT Dispense based on patient and insurance preference. Use up to four times daily as directed. (FOR ICD-9 250.00, 250.01). 1 each 0  . buPROPion (WELLBUTRIN SR) 150 MG 12 hr tablet Take 1 tablet (150 mg total) by mouth 2 (two) times daily. 60 tablet 2  . clopidogrel (PLAVIX) 75 MG tablet Take 1 tablet (75 mg total) by mouth daily. 30 tablet 10  . empagliflozin (JARDIANCE) 10 MG TABS tablet Take 10 mg by mouth daily. 90 tablet 3  . ezetimibe (ZETIA) 10 MG tablet Take 1 tablet (10 mg total) by mouth daily. 90 tablet 3  . famotidine (PEPCID) 20 MG tablet Take 1 tablet (20 mg total) by mouth 2 (two) times daily. 60 tablet 3  . furosemide (LASIX) 80 MG tablet Take 1 tablet (80 mg total) 2 (two) times daily by mouth. 180 tablet 3  . gabapentin (NEURONTIN) 300 MG capsule Take 3 capsules (900 mg total) by mouth 3 (three) times daily. 180 capsule 2  . glucose blood (TRUE METRIX BLOOD GLUCOSE TEST) test strip Use as instructed 100 each 12  . ibuprofen (ADVIL,MOTRIN) 800 MG tablet TAKE 1 TABLET BY MOUTH EVERY 8 HOURS AS NEEDED FOR MODERATE PAIN (TAKE WITH FOOD). 40 tablet 0  . Insulin Glargine (LANTUS SOLOSTAR) 100  UNIT/ML Solostar Pen Inject 45 Units into the skin 2 (two) times daily. 90 mL 3  . insulin lispro (HUMALOG) 100 UNIT/ML injection Inject 0.2 mLs (20 Units total) into the skin 3 (three) times daily with meals. 20 mL 2  . Insulin Pen Needle 31G X 8 MM MISC Use as directed 100 each 5  . Insulin Syringe-Needle U-100 (B-D INS SYRINGE 0.5CC/31GX5/16) 31G X 5/16" 0.5 ML MISC 1 each by Does not apply route 3 (three) times daily. 90 each 3  . Insulin Syringes, Disposable, U-100 0.5 ML MISC Take insulin as directed   Diagnosis: Insulin dependent diabetes mellitus ICD10 250 1 each 0  . lisinopril (PRINIVIL,ZESTRIL) 10 MG tablet Take 1 tablet (10 mg total) by mouth 2 (two) times daily. 60 tablet 3  . metFORMIN (GLUCOPHAGE-XR) 500 MG 24 hr tablet TAKE 2 TABLETS BY MOUTH 2 TIMES DAILY WITH A MEAL. 120 tablet 11  . metoprolol tartrate (LOPRESSOR) 50  MG tablet Take 1 tablet (50 mg total) by mouth 2 (two) times daily. 90 tablet 11  . nitroGLYCERIN (NITROSTAT) 0.4 MG SL tablet Place 1 tablet (0.4 mg total) under the tongue every 5 (five) minutes as needed for chest pain. 30 tablet 12  . polyethylene glycol (MIRALAX) packet Take 17 g by mouth daily. 14 each 0  . Potassium Chloride ER 20 MEQ TBCR Take 40 mEq daily by mouth. 180 tablet 2  . rosuvastatin (CRESTOR) 40 MG tablet Take 1 tablet (40 mg total) by mouth daily. 90 tablet 3  . TRUEPLUS LANCETS 28G MISC Use as directed 100 each 12   No current facility-administered medications for this visit.      Allergies:   Other and Levofloxacin   Social History   Socioeconomic History  . Marital status: Divorced    Spouse name: None  . Number of children: None  . Years of education: None  . Highest education level: None  Social Needs  . Financial resource strain: None  . Food insecurity - worry: None  . Food insecurity - inability: None  . Transportation needs - medical: None  . Transportation needs - non-medical: None  Occupational History  . Occupation:  Biochemist, clinical  Tobacco Use  . Smoking status: Former Research scientist (life sciences)  . Smokeless tobacco: Never Used  . Tobacco comment: quit 01/2017  Substance and Sexual Activity  . Alcohol use: No    Alcohol/week: 0.0 oz  . Drug use: No  . Sexual activity: None  Other Topics Concern  . None  Social History Narrative   Adopted. Very little info on biological parents, ?diabetes.   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 Diabetes Maternal Grandmother      ROS:   Please see the history of present illness.  All other systems are reviewed and otherwise negative.    PHYSICAL EXAM:   VS:  BP 110/60   Pulse 64   Resp 16   Ht 5' 5" (1.651 m)   Wt (!) 306 lb (138.8 kg)   SpO2 93%   BMI 50.92 kg/m   BMI: Body mass index is 50.92 kg/m. GEN: Well nourished, well developed morbidly obese WM, in no acute distress  HEENT: normocephalic, atraumatic Neck: no JVD, carotid bruits, or masses Cardiac: RRR; no murmurs, rubs, or gallops, diffuse anasarca BLE, upper extremities, even shoulders back and abdomen feel tight Respiratory:  Diffusely diminished bilaterally, normal work of breathing GI: soft, nontender, nondistended, + BS MS: no deformity or atrophy  Skin: warm and dry, no rash Neuro:  Alert and Oriented x 3, Strength and sensation are intact, follows commands Psych: euthymic mood, full affect  Wt Readings from Last 3 Encounters:  05/13/17 (!) 306 lb (138.8 kg)  05/06/17 (!) 304 lb 1.9 oz (137.9 kg)  05/05/17 295 lb (133.8 kg)      Studies/Labs Reviewed:   EKG:  EKG was ordered today and personally reviewed by me and demonstrates NSR right axis deviation nonspecific ST-T changes Recent Labs: 07/09/2016: B Natriuretic Peptide 71.4 05/01/2017: ALT 24; Hemoglobin 11.7; Platelets 210 05/06/2017: BUN 19; Creatinine, Ser 1.77; NT-Pro BNP 95; Potassium 4.2; Sodium 139   Lipid Panel    Component Value Date/Time   CHOL  104 09/18/2016 1028   TRIG 76 09/18/2016 1028   HDL 39 (L) 09/18/2016 1028   CHOLHDL 2.7 09/18/2016 1028   CHOLHDL  2.9 07/10/2016 0416   VLDL 18 07/10/2016 0416   LDLCALC 50 09/18/2016 1028    Additional studies/ records that were reviewed today include: Summarized above.   ASSESSMENT & PLAN:   1. Anasarca in the context of acute kidney injury and possible acute on chronic diastolic CHF - the patient returns with even further weight gain and persistent diffuse anasarca despite outpatient diuretic titration. Prior dry weight was 293 and he is now 306lb. His diffuse edema pattern is not typical of CHF so in light of this and prior totally normal BNP, there is concern for an underlying systemic process linking together his fluid retention, recent renal decline, persistent leukocytosis, and diffuse arthralgias. I am concerned there will be progressive renal failure on labwork. There have been reports of acute renal failure with Jardiance so appreciate IM's input on this. He also remains on ACEI. May need to involve nephrology if creatinine has worsened to discuss use of IV diuretics. His blood pressure was soft in clinic and he was hypoxic with ambulation thus we have recommended going to the emergency room for further evaluation and probable admission by internal medicine, with cardiology following as consultants. Recommend repeat echocardiogram as Dr. Acie Fredrickson stated he would also be concerned for an infiltrative process such as amyloid. He saw and examined the patient with me. 2. Hypoxia with ambulation - will need evaluation for  home O2 during hospital stay. 3. Essential HTN - BP low by tech check, recheck by me 110/60. 4. CAD - prior stable workup as above. Continue aspirin, beta blocker, Plavix. If creatinine has worsened further, would consider changing Crestor to Atorvastatin.  Disposition: F/u TBD based on hospital course.   Medication Adjustments/Labs and Tests Ordered: Current medicines  are reviewed at length with the patient today.  Concerns regarding medicines are outlined above. Medication changes, Labs and Tests ordered today are summarized above and listed in the Patient Instructions accessible in Encounters.   Signed, Charlie Pitter, PA-C  05/13/2017 2:55 PM    [address], [place], [place]  [address] Phone: [phone]; Fax: [phone]

## 2017-05-12 NOTE — Telephone Encounter (Signed)
Patient understands that when the lights go out it takes him a short amount of time to go to sleep but Dr Mayford Knifeurner is not sure how accurate the study is due to a difficult titration the night before.  Patient understands Dr Mayford Knifeurner would like to try him on a new CPAP setting first and the see how he does over the next few weeks with his daytime sleepiness.   Patient understands Dr Mayford Knifeurner needs to see him for a 10 week follow up appointment after the settings change.  Patient explains that he does not have insurance and can not afford to purchase his CPAP unit.  CPAP assistant informed patient about the CPAP Assistance Program that can help him get a CPAP but he will be responsible for getting his supplies.  Patient cannot afford the supplies because he has lost his job due to his severe sleep apnea and falling asleep on the job.  Patient states he will seek help at the [facility] to see if he can get a orange card that gives him some insurance.

## 2017-05-12 NOTE — Telephone Encounter (Signed)
-----   Message from Quintella Reichertraci R Turner, MD sent at 05/12/2017 12:23 PM EST ----- Please let patient know that they had a successful PAP titration and let DME know that orders are in EPIC.  Please set up 10 week OV with me.

## 2017-05-12 NOTE — Telephone Encounter (Signed)
Informed patient of sleep study results and patient understanding was verbalized. Patient understands he had a successful titration and Dr Mayford Knifeurner has ordered him a CPAP. Patient understands Dr Mayford Knifeurner needs to see him for a 10 week follow up appointment. Patient explains that he does not have insurance and can not afford to purchase his CPAP unit. CPAP assistant informed patient about the CPAP Assistance Program that can help him get a CPAP but he will be responsible for getting his supplies. Patient cannot afford the supplies because he has lost his job due to his severe sleep apnea.    Patient states he will seek help at the [facility] to see if he can get a orange card that gives him some kind of insurance.

## 2017-05-12 NOTE — Procedures (Signed)
    Patient Name: Marc Walker, Marc Walker Study Date: 05/05/2017 Gender: Male D.O.B: 01-29-1971 Age (years): [age] Referring Provider: Armanda Magicraci Madix Blowe MD, ABSM Height (inches): 65 Interpreting Physician: Armanda Magicraci Alok Minshall MD, ABSM Weight (lbs): 295 RPSGT: [place] SinkBarksdale, [place] BMI: 49 MRN: [id number] Neck Size: 21.00  CLINICAL INFORMATION Sleep Study Type: MSLT  The patient was referred to the sleep center for evaluation of daytime sleepiness.  Epworth Sleepiness Score: 24  SLEEP STUDY TECHNIQUE A Multiple Sleep Latency Test was performed after an overnight polysomnogram according to the AASM scoring manual v2.3 (April 2016) and clinical guidelines. Five nap opportunities occurred over the course of the test which followed an overnight polysomnogram. The channels recorded and monitored were frontal, central, and occipital electroencephalography (EEG), right and left electrooculogram (EOG), chin electromyography (EMG), and electrocardiogram (EKG).  MEDICATIONS Medications taken by the patient : BUPROPION SR, FAMOTIDINE, FUROSEMIDE, GABAPENTIN, INSULIN GLARGINE, HUMALOG, LISINOPRIL, METFORMIN, METOPROLOL TARTRATE, FLUTICASONE PROPIONATE, SYSTANE, ANTISEPTIC MOUTHWASH  Medications administered by patient during sleep study : No sleep medicine administered  IMPRESSIONS - Total number of naps attempted: 5.00 . Total number of naps with sleep attained: 5. The Mean Sleep Latency was 01:34 minutes. There were 2 sleep-onset REM periods. - The patient appears to have pathologic sleepiness, evidenced by a short mean sleep latency (8 minutes or less) on this MSLT. - 2 sleep onset REMs were noted during this MSLT.  DIAGNOSIS - Pathologic Sleepiness - OSA on BiPAP  RECOMMENDATIONS - Patient did not have adequate BiPAP titration the night before the MSLT and therefore unclear as to significance of short sleep latency. - Recommend followup in office in 10 weeks after starting on new CPAP setting to see if  daytime sleepiness improves.  Armanda Magicraci Gale Hulse Diplomate, American Board of Sleep Medicine  ELECTRONICALLY SIGNED ON:  05/12/2017, 12:56 PM [facility] PH: [phone]   FX: [phone] ACCREDITED BY THE AMERICAN ACADEMY OF SLEEP MEDICINE

## 2017-05-12 NOTE — Procedures (Signed)
   Patient Name: Marc Walker, Tally Study Date: 05/04/2017 Gender: Male D.O.B: [birth year] Age (years): [age] Referring Provider: Armanda Magicraci Helaine Yackel MD, ABSM Height (inches): 65 Interpreting Physician: Armanda Magicraci Alean Kromer MD, ABSM Weight (lbs): 295 RPSGT: [name], [name] BMI: 49 MRN: [id number] Neck Size: 21.00  CLINICAL INFORMATION The patient is referred for a BiPAP titration to treat sleep apnea.  SLEEP STUDY TECHNIQUE As per the AASM Manual for the Scoring of Sleep and Associated Events v2.3 (April 2016) with a hypopnea requiring 4% desaturations.  The channels recorded and monitored were frontal, central and occipital EEG, electrooculogram (EOG), submentalis EMG (chin), nasal and oral airflow, thoracic and abdominal wall motion, anterior tibialis EMG, snore microphone, electrocardiogram, and pulse oximetry. Bilevel positive airway pressure (BPAP) was initiated at the beginning of the study and titrated to treat sleep-disordered breathing.  MEDICATIONS Medications self-administered by patient taken the night of the study : BUPROPION SR, FAMOTIDINE, FUROSEMIDE, GABAPENTIN, INSULIN GLARGINE, HUMALOG, LISINOPRIL, METFORMIN, METOPROLOL TARTRATE, FLUTICASONE PROPIONATE, SYSTANE, ANTISEPTIC MOUTHWASH  RESPIRATORY PARAMETERS Optimal CPAP Pressure (cm):18  AHI at Optimal Pressure (/hr) 0.0 Overall Minimal O2 (%):65.00  Minimal O2 at Optimal Pressure (%): 78.0  SLEEP ARCHITECTURE Start Time:11:08:53 PM  Stop Time:6:04:35 AM Total Time (min):415.7  Total Sleep Time (min):307.5 Sleep Latency (min):4.1  Sleep Efficiency (%):74.0  REM Latency (min):120.5  WASO (min):104.1 Stage N1 (%): 9.59  tage N2 (%):75.61  Stage N3 (%): 0.00  Stage R (%):14.80 Supine (%):23.65  Arousal Index (/hr):18.1      CARDIAC DATA The 2 lead EKG demonstrated sinus rhythm. The mean heart rate was 65.86 beats per minute. Other EKG findings include: None  LEG MOVEMENT DATA The total Periodic Limb Movements of Sleep  (PLMS) were 125. The PLMS index was 24.39. A PLMS index of <15 is considered normal in adults.  IMPRESSIONS - An optimal PAP pressure was selected for this patient ( 18 cm of water) - Central sleep apnea was not noted during this titration (CAI = 0.0/h). - Severe oxygen desaturations were observed during this titration (min O2 = 65.00%). - The patient snored with loud snoring volume. - No cardiac abnormalities were observed during this study. - Mild periodic limb movements were observed during this study. Arousals associated with PLMs were rare  DIAGNOSIS - Obstructive Sleep Apnea (327.23 [G47.33 ICD-10]) - Noctunal Hypoxemia  RECOMMENDATIONS - Trial of CPAP therapy on 18cm H2O with a Medium size Resmed Full Face Mask AirFit F20 mask and heated humidification. - Oxygen at 2L via CPAP device nightly - Avoid alcohol, sedatives and other CNS depressants that may worsen sleep apnea and disrupt normal sleep architecture. - Sleep hygiene should be reviewed to assess factors that may improve sleep quality. - Weight management and regular exercise should be initiated or continued. - Return to Sleep Center for re-evaluation after 10 weeks of   Kaizer Dissinger Diplomate, American Board of Sleep Medicine  ELECTRONICALLY SIGNED ON:  05/12/2017, 12:18 PM [facility] PH: [phone]   FX: [phone] ACCREDITED BY THE AMERICAN ACADEMY OF SLEEP MEDICINE

## 2017-05-13 ENCOUNTER — Emergency Department (HOSPITAL_COMMUNITY): Payer: Self-pay

## 2017-05-13 ENCOUNTER — Other Ambulatory Visit: Payer: Self-pay

## 2017-05-13 ENCOUNTER — Ambulatory Visit (INDEPENDENT_AMBULATORY_CARE_PROVIDER_SITE_OTHER): Payer: Self-pay | Admitting: Physician Assistant

## 2017-05-13 ENCOUNTER — Encounter (HOSPITAL_COMMUNITY): Payer: Self-pay | Admitting: *Deleted

## 2017-05-13 ENCOUNTER — Encounter: Payer: Self-pay | Admitting: Physician Assistant

## 2017-05-13 ENCOUNTER — Inpatient Hospital Stay (HOSPITAL_COMMUNITY)
Admission: EM | Admit: 2017-05-13 | Discharge: 2017-05-16 | DRG: 291 | Disposition: A | Discharge status: Home Health | Payer: Self-pay | Attending: Internal Medicine | Admitting: Internal Medicine

## 2017-05-13 VITALS — BP 110/60 | HR 64 | Resp 16 | Ht 65.0 in | Wt 306.0 lb

## 2017-05-13 DIAGNOSIS — E785 Hyperlipidemia, unspecified: Secondary | ICD-10-CM | POA: Diagnosis present

## 2017-05-13 DIAGNOSIS — R946 Abnormal results of thyroid function studies: Secondary | ICD-10-CM | POA: Diagnosis present

## 2017-05-13 DIAGNOSIS — F329 Major depressive disorder, single episode, unspecified: Secondary | ICD-10-CM | POA: Diagnosis present

## 2017-05-13 DIAGNOSIS — N182 Chronic kidney disease, stage 2 (mild): Secondary | ICD-10-CM | POA: Diagnosis present

## 2017-05-13 DIAGNOSIS — I2510 Atherosclerotic heart disease of native coronary artery without angina pectoris: Secondary | ICD-10-CM | POA: Diagnosis present

## 2017-05-13 DIAGNOSIS — I25118 Atherosclerotic heart disease of native coronary artery with other forms of angina pectoris: Secondary | ICD-10-CM | POA: Diagnosis present

## 2017-05-13 DIAGNOSIS — Z794 Long term (current) use of insulin: Secondary | ICD-10-CM

## 2017-05-13 DIAGNOSIS — Z6841 Body Mass Index (BMI) 40.0 and over, adult: Secondary | ICD-10-CM

## 2017-05-13 DIAGNOSIS — N179 Acute kidney failure, unspecified: Secondary | ICD-10-CM

## 2017-05-13 DIAGNOSIS — I959 Hypotension, unspecified: Secondary | ICD-10-CM | POA: Diagnosis present

## 2017-05-13 DIAGNOSIS — R9431 Abnormal electrocardiogram [ECG] [EKG]: Secondary | ICD-10-CM | POA: Diagnosis present

## 2017-05-13 DIAGNOSIS — I5032 Chronic diastolic (congestive) heart failure: Secondary | ICD-10-CM | POA: Diagnosis present

## 2017-05-13 DIAGNOSIS — Z888 Allergy status to other drugs, medicaments and biological substances status: Secondary | ICD-10-CM

## 2017-05-13 DIAGNOSIS — I5033 Acute on chronic diastolic (congestive) heart failure: Secondary | ICD-10-CM

## 2017-05-13 DIAGNOSIS — F32A Depression, unspecified: Secondary | ICD-10-CM | POA: Diagnosis present

## 2017-05-13 DIAGNOSIS — R0902 Hypoxemia: Secondary | ICD-10-CM

## 2017-05-13 DIAGNOSIS — Z886 Allergy status to analgesic agent status: Secondary | ICD-10-CM

## 2017-05-13 DIAGNOSIS — R000 Tachycardia, unspecified: Secondary | ICD-10-CM | POA: Diagnosis present

## 2017-05-13 DIAGNOSIS — I10 Essential (primary) hypertension: Secondary | ICD-10-CM

## 2017-05-13 DIAGNOSIS — R6510 Systemic inflammatory response syndrome (SIRS) of non-infectious origin without acute organ dysfunction: Secondary | ICD-10-CM | POA: Diagnosis present

## 2017-05-13 DIAGNOSIS — Z7902 Long term (current) use of antithrombotics/antiplatelets: Secondary | ICD-10-CM

## 2017-05-13 DIAGNOSIS — J9601 Acute respiratory failure with hypoxia: Secondary | ICD-10-CM | POA: Diagnosis present

## 2017-05-13 DIAGNOSIS — E039 Hypothyroidism, unspecified: Secondary | ICD-10-CM | POA: Diagnosis present

## 2017-05-13 DIAGNOSIS — R601 Generalized edema: Secondary | ICD-10-CM

## 2017-05-13 DIAGNOSIS — T501X5A Adverse effect of loop [high-ceiling] diuretics, initial encounter: Secondary | ICD-10-CM | POA: Diagnosis present

## 2017-05-13 DIAGNOSIS — E876 Hypokalemia: Secondary | ICD-10-CM | POA: Diagnosis present

## 2017-05-13 DIAGNOSIS — Z7982 Long term (current) use of aspirin: Secondary | ICD-10-CM

## 2017-05-13 DIAGNOSIS — I472 Ventricular tachycardia: Secondary | ICD-10-CM

## 2017-05-13 DIAGNOSIS — Z79899 Other long term (current) drug therapy: Secondary | ICD-10-CM

## 2017-05-13 DIAGNOSIS — G4733 Obstructive sleep apnea (adult) (pediatric): Secondary | ICD-10-CM | POA: Diagnosis present

## 2017-05-13 DIAGNOSIS — I130 Hypertensive heart and chronic kidney disease with heart failure and stage 1 through stage 4 chronic kidney disease, or unspecified chronic kidney disease: Principal | ICD-10-CM | POA: Diagnosis present

## 2017-05-13 DIAGNOSIS — Z87891 Personal history of nicotine dependence: Secondary | ICD-10-CM

## 2017-05-13 DIAGNOSIS — E1122 Type 2 diabetes mellitus with diabetic chronic kidney disease: Secondary | ICD-10-CM | POA: Diagnosis present

## 2017-05-13 DIAGNOSIS — E1142 Type 2 diabetes mellitus with diabetic polyneuropathy: Secondary | ICD-10-CM | POA: Diagnosis present

## 2017-05-13 DIAGNOSIS — I252 Old myocardial infarction: Secondary | ICD-10-CM

## 2017-05-13 DIAGNOSIS — Z9989 Dependence on other enabling machines and devices: Secondary | ICD-10-CM

## 2017-05-13 DIAGNOSIS — D631 Anemia in chronic kidney disease: Secondary | ICD-10-CM | POA: Diagnosis present

## 2017-05-13 DIAGNOSIS — K219 Gastro-esophageal reflux disease without esophagitis: Secondary | ICD-10-CM | POA: Diagnosis present

## 2017-05-13 DIAGNOSIS — M2550 Pain in unspecified joint: Secondary | ICD-10-CM | POA: Diagnosis present

## 2017-05-13 DIAGNOSIS — D72829 Elevated white blood cell count, unspecified: Secondary | ICD-10-CM | POA: Diagnosis present

## 2017-05-13 DIAGNOSIS — Z955 Presence of coronary angioplasty implant and graft: Secondary | ICD-10-CM

## 2017-05-13 DIAGNOSIS — I4581 Long QT syndrome: Secondary | ICD-10-CM | POA: Diagnosis present

## 2017-05-13 DIAGNOSIS — Z881 Allergy status to other antibiotic agents status: Secondary | ICD-10-CM

## 2017-05-13 HISTORY — DX: Abnormal electrocardiogram (ECG) (EKG): R94.31

## 2017-05-13 HISTORY — DX: Male erectile dysfunction, unspecified: N52.9

## 2017-05-13 HISTORY — DX: Ventricular tachycardia: I47.2

## 2017-05-13 LAB — HEPATIC FUNCTION PANEL
ALT: 132 U/L — AB (ref 17–63)
AST: 158 U/L — ABNORMAL HIGH (ref 15–41)
Albumin: 4 g/dL (ref 3.5–5.0)
Alkaline Phosphatase: 68 U/L (ref 38–126)
TOTAL PROTEIN: 7.4 g/dL (ref 6.5–8.1)
Total Bilirubin: 0.3 mg/dL (ref 0.3–1.2)

## 2017-05-13 LAB — I-STAT TROPONIN, ED: Troponin i, poc: 0.01 ng/mL (ref 0.00–0.08)

## 2017-05-13 LAB — URINALYSIS, ROUTINE W REFLEX MICROSCOPIC
Bilirubin Urine: NEGATIVE
Hgb urine dipstick: NEGATIVE
KETONES UR: NEGATIVE mg/dL
Leukocytes, UA: NEGATIVE
NITRITE: NEGATIVE
PH: 6 (ref 5.0–8.0)
PROTEIN: NEGATIVE mg/dL
Specific Gravity, Urine: 1.011 (ref 1.005–1.030)

## 2017-05-13 LAB — GLUCOSE, CAPILLARY: GLUCOSE-CAPILLARY: 242 mg/dL — AB (ref 65–99)

## 2017-05-13 LAB — BASIC METABOLIC PANEL
Anion gap: 8 (ref 5–15)
BUN: 26 mg/dL — AB (ref 6–20)
CHLORIDE: 97 mmol/L — AB (ref 101–111)
CO2: 32 mmol/L (ref 22–32)
CREATININE: 1.82 mg/dL — AB (ref 0.61–1.24)
Calcium: 9.4 mg/dL (ref 8.9–10.3)
GFR calc non Af Amer: 43 mL/min — ABNORMAL LOW (ref >60)
GFR, EST AFRICAN AMERICAN: 49 mL/min — AB (ref >60)
Glucose, Bld: 232 mg/dL — ABNORMAL HIGH (ref 65–99)
POTASSIUM: 3.9 mmol/L (ref 3.5–5.1)
Sodium: 137 mmol/L (ref 135–145)

## 2017-05-13 LAB — CBC
HEMATOCRIT: 36 % — AB (ref 39.0–52.0)
Hemoglobin: 11.9 g/dL — ABNORMAL LOW (ref 13.0–17.0)
MCH: 31.4 pg (ref 26.0–34.0)
MCHC: 33.1 g/dL (ref 30.0–36.0)
MCV: 95 fL (ref 78.0–100.0)
PLATELETS: 211 10*3/uL (ref 150–400)
RBC: 3.79 MIL/uL — AB (ref 4.22–5.81)
RDW: 13.8 % (ref 11.5–15.5)
WBC: 12.6 10*3/uL — ABNORMAL HIGH (ref 4.0–10.5)

## 2017-05-13 LAB — PROCALCITONIN: Procalcitonin: <0.1 ng/mL

## 2017-05-13 LAB — T4, FREE

## 2017-05-13 LAB — BRAIN NATRIURETIC PEPTIDE: B NATRIURETIC PEPTIDE 5: 24.2 pg/mL (ref 0.0–100.0)

## 2017-05-13 LAB — SEDIMENTATION RATE: SED RATE: 38 mm/h — AB (ref 0–16)

## 2017-05-13 LAB — PROTIME-INR
INR: 0.97
Prothrombin Time: 12.8 seconds (ref 11.4–15.2)

## 2017-05-13 LAB — C-REACTIVE PROTEIN

## 2017-05-13 LAB — TSH: TSH: 129.821 u[IU]/mL — ABNORMAL HIGH (ref 0.350–4.500)

## 2017-05-13 LAB — LACTIC ACID, PLASMA: LACTIC ACID, VENOUS: 1.5 mmol/L (ref 0.5–1.9)

## 2017-05-13 LAB — TROPONIN I

## 2017-05-13 MED ORDER — BUPROPION HCL ER (SR) 150 MG PO TB12
150.0000 mg | ORAL_TABLET | Freq: Two times a day (BID) | ORAL | Status: DC
  Administered 2017-05-13 – 2017-05-16 (×6): 150 mg via ORAL
  Filled 2017-05-13 (×6): qty 1

## 2017-05-13 MED ORDER — FAMOTIDINE 20 MG PO TABS
20.0000 mg | ORAL_TABLET | Freq: Two times a day (BID) | ORAL | Status: DC
  Administered 2017-05-13 – 2017-05-14 (×2): 20 mg via ORAL
  Filled 2017-05-13 (×2): qty 1

## 2017-05-13 MED ORDER — CLOPIDOGREL BISULFATE 75 MG PO TABS
75.0000 mg | ORAL_TABLET | Freq: Every day | ORAL | Status: DC
  Administered 2017-05-14 – 2017-05-16 (×3): 75 mg via ORAL
  Filled 2017-05-13 (×3): qty 1

## 2017-05-13 MED ORDER — POLYETHYLENE GLYCOL 3350 17 G PO PACK
17.0000 g | PACK | Freq: Every day | ORAL | Status: DC
  Filled 2017-05-13: qty 1

## 2017-05-13 MED ORDER — INSULIN ASPART 100 UNIT/ML ~~LOC~~ SOLN
0.0000 [IU] | Freq: Three times a day (TID) | SUBCUTANEOUS | Status: DC
  Administered 2017-05-14: 3 [IU] via SUBCUTANEOUS
  Administered 2017-05-14: 7 [IU] via SUBCUTANEOUS

## 2017-05-13 MED ORDER — ROSUVASTATIN CALCIUM 40 MG PO TABS
40.0000 mg | ORAL_TABLET | Freq: Every day | ORAL | Status: DC
  Administered 2017-05-14 – 2017-05-16 (×3): 40 mg via ORAL
  Filled 2017-05-13 (×3): qty 1

## 2017-05-13 MED ORDER — EZETIMIBE 10 MG PO TABS
10.0000 mg | ORAL_TABLET | Freq: Every day | ORAL | Status: DC
  Administered 2017-05-14 – 2017-05-16 (×3): 10 mg via ORAL
  Filled 2017-05-13 (×3): qty 1

## 2017-05-13 MED ORDER — HYDROCORTISONE NA SUCCINATE PF 100 MG IJ SOLR
50.0000 mg | Freq: Once | INTRAMUSCULAR | Status: AC
Start: 2017-05-13 — End: 2017-05-13
  Administered 2017-05-13: 50 mg via INTRAVENOUS
  Filled 2017-05-13: qty 2

## 2017-05-13 MED ORDER — INSULIN GLARGINE 100 UNIT/ML ~~LOC~~ SOLN
30.0000 [IU] | Freq: Two times a day (BID) | SUBCUTANEOUS | Status: DC
  Administered 2017-05-13 – 2017-05-14 (×3): 30 [IU] via SUBCUTANEOUS
  Filled 2017-05-13 (×4): qty 0.3

## 2017-05-13 MED ORDER — SODIUM CHLORIDE 0.9 % IV SOLN: 250.0000 mL | INTRAVENOUS | Status: DC | PRN

## 2017-05-13 MED ORDER — ENOXAPARIN SODIUM 80 MG/0.8ML ~~LOC~~ SOLN
70.0000 mg | SUBCUTANEOUS | Status: DC
Start: 2017-05-14 — End: 2017-05-16
  Administered 2017-05-14 – 2017-05-16 (×3): 70 mg via SUBCUTANEOUS
  Filled 2017-05-13 (×3): qty 0.8

## 2017-05-13 MED ORDER — GABAPENTIN 300 MG PO CAPS
900.0000 mg | ORAL_CAPSULE | Freq: Three times a day (TID) | ORAL | Status: DC
  Administered 2017-05-13 – 2017-05-16 (×8): 900 mg via ORAL
  Filled 2017-05-13 (×8): qty 3

## 2017-05-13 MED ORDER — SODIUM CHLORIDE 0.9% FLUSH: 3.0000 mL | INTRAVENOUS | Status: DC | PRN

## 2017-05-13 MED ORDER — ZOLPIDEM TARTRATE 5 MG PO TABS: 5.0000 mg | ORAL_TABLET | Freq: Every evening | ORAL | Status: DC | PRN

## 2017-05-13 MED ORDER — ASPIRIN 81 MG PO CHEW
81.0000 mg | CHEWABLE_TABLET | Freq: Every day | ORAL | Status: DC
  Administered 2017-05-13 – 2017-05-16 (×4): 81 mg via ORAL
  Filled 2017-05-13 (×4): qty 1

## 2017-05-13 MED ORDER — NITROGLYCERIN 0.4 MG SL SUBL: 0.4000 mg | SUBLINGUAL_TABLET | SUBLINGUAL | Status: DC | PRN

## 2017-05-13 MED ORDER — SODIUM CHLORIDE 0.9% FLUSH
3.0000 mL | Freq: Two times a day (BID) | INTRAVENOUS | Status: DC
  Administered 2017-05-13 – 2017-05-16 (×6): 3 mL via INTRAVENOUS

## 2017-05-13 MED ORDER — METOPROLOL TARTRATE 50 MG PO TABS
50.0000 mg | ORAL_TABLET | Freq: Two times a day (BID) | ORAL | Status: DC
  Administered 2017-05-13 – 2017-05-16 (×3): 50 mg via ORAL
  Filled 2017-05-13 (×5): qty 1

## 2017-05-13 MED ORDER — FUROSEMIDE 10 MG/ML IJ SOLN
80.0000 mg | Freq: Two times a day (BID) | INTRAMUSCULAR | Status: DC
  Administered 2017-05-14: 80 mg via INTRAVENOUS
  Filled 2017-05-13: qty 8

## 2017-05-13 MED ORDER — ONDANSETRON HCL 4 MG/2ML IJ SOLN: 4.0000 mg | Freq: Four times a day (QID) | INTRAMUSCULAR | Status: DC | PRN

## 2017-05-13 MED ORDER — GI COCKTAIL ~~LOC~~
30.0000 mL | Freq: Once | ORAL | Status: AC
  Administered 2017-05-13: 30 mL via ORAL
  Filled 2017-05-13: qty 30

## 2017-05-13 MED ORDER — FUROSEMIDE 10 MG/ML IJ SOLN
80.0000 mg | Freq: Once | INTRAMUSCULAR | Status: AC
  Administered 2017-05-13: 80 mg via INTRAVENOUS
  Filled 2017-05-13: qty 8

## 2017-05-13 MED ORDER — HYDRALAZINE HCL 20 MG/ML IJ SOLN: 5.0000 mg | INTRAMUSCULAR | Status: DC | PRN

## 2017-05-13 MED ORDER — ACETAMINOPHEN 325 MG PO TABS
650.0000 mg | ORAL_TABLET | ORAL | Status: DC | PRN
  Filled 2017-05-13: qty 2

## 2017-05-13 NOTE — ED Triage Notes (Signed)
Pt in c/o continued lower extremity swelling and shortness of breath with ambulation that has been going on for months, sent for further eval from cardiology office

## 2017-05-13 NOTE — ED Notes (Signed)
RN attempted to call report to floor; RN to call back  

## 2017-05-13 NOTE — Progress Notes (Signed)
New Admission Note:   Arrival Method: ED bed with ED nurse tech, ambulated to unit bed with a cane from home Mental Orientation: A&O x4 Telemetry: initiated, verified Skin: WNL, BLE edema Pain: denies Safety Measures: initiated  Orders to be reviewed and implemented. Will continue to monitor the patient. Call light has been placed within reach and bed alarm has been activated.   Mar DaringJequetta Tinea Nobile, RN Phone: [phone]

## 2017-05-13 NOTE — ED Notes (Signed)
ED Provider at bedside. 

## 2017-05-13 NOTE — H&P (Addendum)
History and Physical    Marc Walker PXT:[id number] DOB: [birth year] DOA: 05/13/2017  Referring MD/NP/PA:   PCP: Ladell Pier, MD   Patient coming from:  The patient is coming from home.  At baseline, pt is independent for most of ADL  Chief Complaint: SOB, leg edema and joint pain  HPI: Marc Walker is a 46 y.o. male with medical history significant of dCHF, hypertension, hyperlipidemia, diabetes mellitus, GERD, depression, former smoker (quit 17 days ago), OSA on CPAP, morbid obesity, CAD, s/p of stent placement, CKD-II, hypothyroidism, who presents with shortness of breath, leg edema and joint pain.  Patient states that he has been having shortness of breath in the past several months, which has been progressively getting worse. He has bilateral leg edema, which has also been progressively getting worse. His body weight has increased by more than 10 LBs. Patient does not have chest pain. He denies cough, fever or chills. Patient reports that he has diffuse joint pain, involving shoulders, elbows, hips, lower back, knees, and ankles, which which has been going on for several months. No joint swelling or redness. Patient denies nausea, vomiting, diarrhea, abdominal pain, symptoms of UTI or unilateral weakness. Pt was seen by cardiologist, Dr. Acie Fredrickson in office today, who recommended IV Lasix for diuresis. Pt had oxygen desaturation to 80s reportedly. Patient had initial blood pressure 88/58, which improved to 92/55 EGD. Pt states that he quit smoking 17 days ago and doing fine. He states that he dose not need nicotine patch. Patient states that he used to be on Synthroid, but stopped taking it approximately 6 years ago.  ED Course: pt was found to have  WBC 12.6, negative troponin, pending BNP, worsening renal function, negative UDS, temperature normal, tachycardia, tachypnea, chest x-ray is negative for infiltration, but showed chronic peribronchial thickening.   Review of Systems:    General: no fevers, chills, has body weight gain, has fatigue HEENT: no blurry vision, hearing changes or sore throat Respiratory: has dyspnea, no coughing, wheezing CV: no chest pain, no palpitations GI: no nausea, vomiting, abdominal pain, diarrhea, constipation GU: no dysuria, burning on urination, increased urinary frequency, hematuria  Ext: has leg edema Neuro: no unilateral weakness, numbness, or tingling, no vision change or hearing loss Skin: no rash, no skin tear. MSK: No muscle spasm, no deformity, no limitation of range of movement in spin Heme: No easy bruising.  Travel history: No recent long distant travel.  Allergy:  Allergies  Allergen Reactions  . 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  Pt only wants pain medicine if absolutely necessary.  . Levofloxacin Rash    Rash on back (not sun exposed)and hypersensitivity to skin on exposed skin/arm    Past Medical History:  Diagnosis Date  . Chronic diastolic CHF (congestive heart failure) ([place]) 03/09/2017   Echo 1/18: Mild LVH, EF 60-65, Gr 2 DD, mild LAE; difficult study-no obvious wall motion abnormalities with Definity  . Coronary artery disease    a. s/pprior myocardial infarction, s/p multipleprior stents placedat outside institutions with last intervention 08/2015 at [facility].  . Diabetes mellitus ([place])   . Heartburn   . Hyperlipidemia associated with type 2 diabetes mellitus ([place])   . Hypertension   . MI (myocardial infarction) ([place]) 07/2009   [facility] in [place], [place] for last 2, 1st one in [place], [place]; total of 6 stents, last one 02/2014   . Morbid obesity ([place]) 07/06/2016  .  OSA (obstructive sleep apnea) 07/06/2016   Moderate with AHI 21.8/hr by PSG 08/2012 now on CPAP  . Thyroid disease    past history  . Tobacco abuse     Past Surgical History:  Procedure Laterality Date  . APPENDECTOMY    . CARDIAC CATHETERIZATION N/A 09/16/2015   Procedure: Left Heart Cath and  Coronary Angiography;  Surgeon: Lorretta Harp, MD;  Location: [facility];  Service: Cardiovascular;  Laterality: N/A;  . CARDIAC CATHETERIZATION N/A 09/16/2015   Procedure: Coronary Stent Intervention;  Surgeon: Lorretta Harp, MD;  Location: [facility];  Service: Cardiovascular;  Laterality: N/A;  . CORONARY STENT PLACEMENT      Social History:  reports that he has quit smoking. he has never used smokeless tobacco. He reports that he does not drink alcohol or use drugs.  Family History:  Family History  Adopted: Yes  Problem Relation Age of Onset  . Diabetes Maternal Grandmother      Prior to Admission medications   Medication Sig Start Date End Date Taking? Authorizing Provider  amLODipine (NORVASC) 5 MG tablet Take 1 tablet (5 mg total) by mouth daily. 04/08/17  Yes Ladell Pier, MD  aspirin 81 MG tablet Take 1 tablet (81 mg total) by mouth daily. 11/17/16  Yes Langeland, Dawn T, MD  buPROPion (WELLBUTRIN SR) 150 MG 12 hr tablet Take 1 tablet (150 mg total) by mouth 2 (two) times daily. 04/06/17  Yes Ladell Pier, MD  clopidogrel (PLAVIX) 75 MG tablet Take 1 tablet (75 mg total) by mouth daily. 11/17/16  Yes Langeland, Dawn T, MD  empagliflozin (JARDIANCE) 10 MG TABS tablet Take 10 mg by mouth daily. 04/14/17  Yes Ladell Pier, MD  ezetimibe (ZETIA) 10 MG tablet Take 1 tablet (10 mg total) by mouth daily. 04/14/17  Yes Ladell Pier, MD  famotidine (PEPCID) 20 MG tablet Take 1 tablet (20 mg total) by mouth 2 (two) times daily. 09/01/16  Yes Langeland, Dawn T, MD  furosemide (LASIX) 80 MG tablet Take 1 tablet (80 mg total) 2 (two) times daily by mouth. 05/06/17 08/04/17 Yes Simmons, Brittainy M, PA-C  gabapentin (NEURONTIN) 300 MG capsule Take 3 capsules (900 mg total) by mouth 3 (three) times daily. 04/12/17  Yes Ladell Pier, MD  ibuprofen (ADVIL,MOTRIN) 800 MG tablet TAKE 1 TABLET BY MOUTH EVERY 8 HOURS AS NEEDED FOR MODERATE PAIN (TAKE WITH  FOOD). 10/28/16  Yes Langeland, Dawn T, MD  Insulin Glargine (LANTUS SOLOSTAR) 100 UNIT/ML Solostar Pen Inject 45 Units into the skin 2 (two) times daily. 04/14/17  Yes Ladell Pier, MD  insulin lispro (HUMALOG) 100 UNIT/ML injection Inject 0.2 mLs (20 Units total) into the skin 3 (three) times daily with meals. 01/22/17  Yes Funches, Josalyn, MD  lisinopril (PRINIVIL,ZESTRIL) 10 MG tablet Take 1 tablet (10 mg total) by mouth 2 (two) times daily. 04/08/17  Yes Ladell Pier, MD  metFORMIN (GLUCOPHAGE-XR) 500 MG 24 hr tablet TAKE 2 TABLETS BY MOUTH 2 TIMES DAILY WITH A MEAL. Patient taking differently: Take 1,000 mg 2 (two) times daily by mouth. TAKE 2 TABLETS BY MOUTH 2 TIMES DAILY WITH A MEAL. 03/17/17  Yes Ladell Pier, MD  metoprolol tartrate (LOPRESSOR) 50 MG tablet Take 1 tablet (50 mg total) by mouth 2 (two) times daily. 11/17/16 05/13/17 Yes Langeland, Dawn T, MD  nitroGLYCERIN (NITROSTAT) 0.4 MG SL tablet Place 1 tablet (0.4 mg total) under the tongue every 5 (five) minutes  as needed for chest pain. 09/17/15  Yes Rai, Ripudeep K, MD  polyethylene glycol (MIRALAX) packet Take 17 g by mouth daily. 12/06/16  Yes Kirichenko, Tatyana, PA-C  Potassium Chloride ER 20 MEQ TBCR Take 40 mEq daily by mouth. 05/06/17 08/04/17 Yes Simmons, Brittainy M, PA-C  rosuvastatin (CRESTOR) 40 MG tablet Take 1 tablet (40 mg total) by mouth daily. 11/17/16 05/13/17 Yes Maren Reamer, MD    Physical Exam: Vitals:   05/13/17 1745 05/13/17 1915 05/13/17 2118 05/13/17 2200  BP: 90/67 (!) 92/55 98/66 (!) 90/57  Pulse: (!) 58 67 63 64  Resp: (!) '24 16 18 19  ' Temp:      TempSrc:      SpO2: 95% 91% 97% 100%   General: Not in acute distress. Has anasarca. HEENT:       Eyes: PERRL, EOMI, no scleral icterus.       ENT: No discharge from the ears and nose, no pharynx injection, no tonsillar enlargement.        Neck: Difficult to assess JVD due to morbid obesity. No bruit, no mass felt. Heme: No neck lymph  node enlargement. Cardiac: S1/S2, RRR, No murmurs, No gallops or rubs. Respiratory: No rales, wheezing, rhonchi or rubs. GI: Soft, nondistended, nontender, no rebound pain, no organomegaly, BS present. GU: No hematuria Ext: 3+ leg edema bilaterally. 2+DP/PT pulse bilaterally. Musculoskeletal: No joint deformities, No joint redness or warmth, no limitation of ROM in spin. Skin: No rashes.  Neuro: Alert, oriented X3, cranial nerves II-XII grossly intact, moves all extremities normally.  Psych: Patient is not psychotic, no suicidal or hemocidal ideation.  Labs on Admission: I have personally reviewed following labs and imaging studies  CBC: Recent Labs  Lab 05/13/17 1547  WBC 12.6*  HGB 11.9*  HCT 36.0*  MCV 95.0  PLT 035   Basic Metabolic Panel: Recent Labs  Lab 05/13/17 1547  NA 137  K 3.9  CL 97*  CO2 32  GLUCOSE 232*  BUN 26*  CREATININE 1.82*  CALCIUM 9.4   GFR: Estimated Creatinine Clearance: 65.6 mL/min (A) (by C-G formula based on SCr of 1.82 mg/dL (H)). Liver Function Tests: No results for input(s): AST, ALT, ALKPHOS, BILITOT, PROT, ALBUMIN in the last 168 hours. No results for input(s): LIPASE, AMYLASE in the last 168 hours. No results for input(s): AMMONIA in the last 168 hours. Coagulation Profile: No results for input(s): INR, PROTIME in the last 168 hours. Cardiac Enzymes: No results for input(s): CKTOTAL, CKMB, CKMBINDEX, TROPONINI in the last 168 hours. BNP (last 3 results) Recent Labs    05/06/17 1509  PROBNP 95   HbA1C: No results for input(s): HGBA1C in the last 72 hours. CBG: No results for input(s): GLUCAP in the last 168 hours. Lipid Profile: No results for input(s): CHOL, HDL, LDLCALC, TRIG, CHOLHDL, LDLDIRECT in the last 72 hours. Thyroid Function Tests: Recent Labs    05/13/17 1724  FREET4 <0.25*   Anemia Panel: No results for input(s): VITAMINB12, FOLATE, FERRITIN, TIBC, IRON, RETICCTPCT in the last 72 hours. Urine analysis:     Component Value Date/Time   COLORURINE STRAW (A) 05/13/2017 1943   APPEARANCEUR CLEAR 05/13/2017 1943   LABSPEC 1.011 05/13/2017 1943   PHURINE 6.0 05/13/2017 1943   GLUCOSEU >=500 (A) 05/13/2017 [place] 05/13/2017 [place] NEGATIVE 05/13/2017 [place] 05/13/2017 [place] NEGATIVE 05/13/2017 1943   NITRITE NEGATIVE 05/13/2017 [place] NEGATIVE 05/13/2017 1943  Sepsis Labs: '@LABRCNTIP' (procalcitonin:4,lacticidven:4) )No results found for this or any previous visit (from the past 240 hour(s)).   Radiological Exams on Admission: Dg Chest 2 View  Result Date: 05/13/2017 CLINICAL DATA:  Acute shortness of breath for 3 weeks. Fluid overload. EXAM: CHEST  2 VIEW COMPARISON:  04/25/2017 and prior chest radiographs FINDINGS: Upper limits normal heart size and mild chronic peribronchial thickening again noted. There is no evidence of focal airspace disease, pulmonary edema, suspicious pulmonary nodule/mass, pleural effusion, or pneumothorax. No acute bony abnormalities are identified. IMPRESSION: 1. No evidence of acute cardiopulmonary disease. 2. Mild chronic peribronchial thickening. Electronically Signed   By: Margarette Canada M.D.   On: 05/13/2017 16:10     EKG: Independently reviewed.  Sinus rhythm, QTC 484, low voltage, nonspecific T-wave change.  Assessment/Plan Principal Problem:   Acute on chronic diastolic CHF (congestive heart failure) (HCC) Active Problems:   Essential hypertension   Hyperlipidemia   CAD (coronary artery disease)   DM type 2 with diabetic peripheral neuropathy (HCC)   OSA on CPAP   SIRS (systemic inflammatory response syndrome) (HCC)   Acute respiratory failure with hypoxia (HCC)   GERD (gastroesophageal reflux disease)   Depression   Acute renal failure superimposed on stage 2 chronic kidney disease (HCC)   Hypotension   Joint pain   Hypothyroidism   Acute respiratory failure with hypoxia due to  acute on chronic diastolic CHF: Patient has history of dCHF. 2D echo on 07/10/16 showed EF 60-65% with grade 2 diastolic dysfunction. Patient has anasarca and body weight gain, consistent with CHF exacerbation. Patient was seen by cardiologist, Dr. Cathie Olden, who recommended to start patienton IV Lasix. Pending BNP. Since pt has uncontrolled hypothyroidism, his severe edema may partially from myxedema.  -will admit to tele bed as inpt. -Lasix 80 mg bid by IV -trop x 3 -2d echo -will continue home metoprolol, ASA -Daily weights -strict I/O's -Low salt diet  Hypothyroidism: Patient states that he used to take Synthroid, but stopped taking it 6 years ago. Today. Free T4 <0.25 and TSH 129, indicating Hypothyroidism is not Controlled. Pt is oriented x 3 now, but he has hypotension and bradycardia (though pt is on BB). He is at risk of developing myxedema coma. -will one dose of IV synthroid 100 mcg x 1 and then oral 50 mcg daily -repeat TSH in 4 to 6 weeks -f/u free T3  Essential hypertension: pt had hypotension initially. -will hold amlodipine and lisinopril -Continue metoprolol with holding parameter of SBP<100. -IV hydralazine when necessary  HLD: -zetia and crestor  Hx of CAD: s/p of stent placement. No CP. -Continue aspirin, Plavix, crestor, metoprolol -When necessary nitroglycerin  DM type 2 with diabetic peripheral neuropathy and renal complication: Last V4U9.8 on 03/02/17, well controled. Patient is taking Humalog, metformin, Lantus, Jardiance at home -will decrease Lantus dose from 45 to 30 U bid -SSI  OSA: -CPAP  GERD: -Pepcid   Depression: Stable, no suicidal or homicidal ideations. -Continue home medications: wellbutrin  AoCKD-III: Baseline Cre is 1.2-1.3, pt's Cre is 1.82 and BUN 26 on admission. Likely due to combination of ACEI, diuretics, NSAIDs. - Check  FeUrea - Follow up renal function by BMP - Hold lisinopril and ibuprofen  Hypotension: has resolved. Currently  SBP>90. Etiology is not clear. Patient has leukocytosis, and no fever, no source of infection identified. UA negative. CXR with no infiltration. Differential diagnosis includes sepsis and adrenal insufficiency. He has uncontrolled hypothyroidism, which may have contributed partially. -get Blood culture x 2 -  check lactic acid level -give one dose of Solu Cortef 50 mg  -check cortisol level  SIRS vs. Sepsis: Patient admits criteria for sepsis with leukocytosis, hypotension. No source of infection identified. -hold off Abx now -get Blood culture x 2 -check lactic acid level and pro-calcitonin  Joint pain: Patient has a diffuse joint pain. Etiology is not clear. No joint swelling or redness. Unlikely to have septic joint. -Check CRP, ESR, RPR -prn tylenol  DVT ppx: SQ Lovenox Code Status: Full code Family Communication: None at bed side.     Disposition Plan:  Anticipate discharge back to previous home environment Consults called:  none Admission status: Inpatient/tele    Date of Service 05/13/2017    Ivor Costa [facility] Pager [phone]  If 7PM-7AM, please contact night-coverage [email or web address] Password [facility] 05/13/2017, 10:23 PM

## 2017-05-13 NOTE — Consult Note (Signed)
Cardiology Office Note    Date:  05/13/2017  ID:  Marc Walker, DOB 1971/01/08, MRN [id number] PCP:  Ladell Pier, MD  Cardiologist:  Ross/Turner (OSA)   Chief Complaint: f/u fluid retention  History of Present Illness:  Marc Walker is a 46 y.o. male with history of CAD s/pprior myocardial infarction, s/p multipleprior stents placedat outside institutions with last intervention 08/2015 at [facility], DM, HTN, HL, tobacco abuse, OSA, chronic diastolic HF, morbid obesity who presents for f/u. To recap history, he was previously admitted to [facility] in 08/2015 with unstable angina. LHC demonstrated 80% mid RCA stenosis treated with DES, 90% proximal LAD and 70% mid LAD stenosis involving the previous stent treated with DES, 20% RPDA in-stent restenosis treated with angioplasty. The stent in OM2 was patent. Residual disease included 90% D2 stenosis and 70% mid LCx stenosis, treated medically. Last nuclear stress test in 9/17 demonstrated mild ischemia in the distal OM or PLA branch territory, felt to be low risk, medical therapy was continued. Last echo 06/2016 was technically limited, EF 60-65%, grade 2 DD.   He has been experiencing fluid retention lately in the setting of diffuse arthralgias, particularly upper extremities. He was originally seen in the ED 04/25/17 with bilateral hand swelling and worsening upper extremity joint pains for several months. He had been taking more Motrin lately. Workup was nonacute. He was found to have elevated creatinine and was advised to refrain from NSAIDs and follow low sodium diet and weight loss. He was seen again in the ED 05/01/17 with increasing edema. ED note from 04/25/17 indicates recommendation to increase his Lasix and be compliant with CPAP. Troponin was negative, labs otherwise showed persistent leukocytosis 10.8, mild anemia down to 11.7 (previously 12-14), albumin 4.0, no BNP at that time.  He was seen by Lyda Jester on 05/06/17  with complaints of worsening U/LE edema, DOE and orthopnea.  When seen by [facility] 05/06/17 he was visibly volume overloaded. HCTZ was stopped and Lasix was started at 55m BID with increase in KCl to 487m daily. Labs that day showed further decline in renal function with BUN 19, Cr 1.77. Prior values include 0.70 in 09/2016, 1.36 in 03/2017, then 1.45 on 05/01/17 then 1.77 on 05/06/17. pBNP was normal.   He returns for follow-up today with his dad. He remains volume overloaded with diffuse swelling throughout his legs, hands, arms, even shoulders and bac.  Has been unable to shave in a week because it is uncomfortable to raise his arms. Dyspnea is better and urine output increased per patient, but he reports none of his clothes now fit. No chest pain. He says he has been "trying" to follow a low sodium diet and fluid restriction but I'm not sure based on his tone if he's been compliant. O2 sat was 85% upon ambulation to clinic and staff felt he was visibly SOB walking in. This came up to 93% sitting on the table. Initial BP recorded as 88/56, recheck 110/60 by me.   Past Medical History:  Diagnosis Date  . Chronic diastolic CHF (congestive heart failure) (HCAlvarado9/04/2017   Echo 1/18: Mild LVH, EF 60-65, Gr 2 DD, mild LAE; difficult study-no obvious wall motion abnormalities with Definity  . Coronary artery disease    a. s/pprior myocardial infarction, s/p multipleprior stents placedat outside institutions [facility] . Diabetes mellitus (HCSan Miguel  . Heartburn   . Hyperlipidemia associated with type 2 diabetes mellitus (HC[place]  .  Hypertension   . MI (myocardial infarction) ([place]) 07/2009   [facility] in [place], [place] for last 2, 1st one in [place], [place]; total of 6 stents, last one 02/2014   . Morbid obesity ([place]) 07/06/2016  . OSA (obstructive sleep apnea) 07/06/2016   Moderate with AHI 21.8/hr by PSG 08/2012 now on CPAP  . Thyroid disease    past history  . Tobacco  abuse     Past Surgical History:  Procedure Laterality Date  . APPENDECTOMY    . CARDIAC CATHETERIZATION N/A 09/16/2015   Procedure: Left Heart Cath and Coronary Angiography;  Surgeon: Lorretta Harp, MD;  Location: [place] [facility];  Service: Cardiovascular;  Laterality: N/A;  . CARDIAC CATHETERIZATION N/A 09/16/2015   Procedure: Coronary Stent Intervention;  Surgeon: Lorretta Harp, MD;  Location: [place] [facility];  Service: Cardiovascular;  Laterality: N/A;  . CORONARY STENT PLACEMENT      Current Medications: No current facility-administered medications for this encounter.    Current Outpatient Medications  Medication Sig Dispense Refill  . amLODipine (NORVASC) 5 MG tablet Take 1 tablet (5 mg total) by mouth daily. 30 tablet 6  . aspirin 81 MG tablet Take 1 tablet (81 mg total) by mouth daily. 90 tablet 3  . blood glucose meter kit and supplies KIT Dispense based on patient and insurance preference. Use up to four times daily as directed. (FOR ICD-9 250.00, 250.01). 1 each 0  . buPROPion (WELLBUTRIN SR) 150 MG 12 hr tablet Take 1 tablet (150 mg total) by mouth 2 (two) times daily. 60 tablet 2  . clopidogrel (PLAVIX) 75 MG tablet Take 1 tablet (75 mg total) by mouth daily. 30 tablet 10  . empagliflozin (JARDIANCE) 10 MG TABS tablet Take 10 mg by mouth daily. 90 tablet 3  . ezetimibe (ZETIA) 10 MG tablet Take 1 tablet (10 mg total) by mouth daily. 90 tablet 3  . famotidine (PEPCID) 20 MG tablet Take 1 tablet (20 mg total) by mouth 2 (two) times daily. 60 tablet 3  . furosemide (LASIX) 80 MG tablet Take 1 tablet (80 mg total) 2 (two) times daily by mouth. 180 tablet 3  . gabapentin (NEURONTIN) 300 MG capsule Take 3 capsules (900 mg total) by mouth 3 (three) times daily. 180 capsule 2  . glucose blood (TRUE METRIX BLOOD GLUCOSE TEST) test strip Use as instructed 100 each 12  . ibuprofen (ADVIL,MOTRIN) 800 MG tablet TAKE 1 TABLET BY MOUTH EVERY 8 HOURS AS NEEDED FOR MODERATE PAIN  (TAKE WITH FOOD). 40 tablet 0  . Insulin Glargine (LANTUS SOLOSTAR) 100 UNIT/ML Solostar Pen Inject 45 Units into the skin 2 (two) times daily. 90 mL 3  . insulin lispro (HUMALOG) 100 UNIT/ML injection Inject 0.2 mLs (20 Units total) into the skin 3 (three) times daily with meals. 20 mL 2  . Insulin Pen Needle 31G X 8 MM MISC Use as directed 100 each 5  . Insulin Syringe-Needle U-100 (B-D INS SYRINGE 0.5CC/31GX5/16) 31G X 5/16" 0.5 ML MISC 1 each by Does not apply route 3 (three) times daily. 90 each 3  . Insulin Syringes, Disposable, U-100 0.5 ML MISC Take insulin as directed   Diagnosis: Insulin dependent diabetes mellitus ICD10 250 1 each 0  . lisinopril (PRINIVIL,ZESTRIL) 10 MG tablet Take 1 tablet (10 mg total) by mouth 2 (two) times daily. 60 tablet 3  . metFORMIN (GLUCOPHAGE-XR) 500 MG 24 hr tablet TAKE 2 TABLETS BY MOUTH 2 TIMES DAILY WITH A  MEAL. 120 tablet 11  . metoprolol tartrate (LOPRESSOR) 50 MG tablet Take 1 tablet (50 mg total) by mouth 2 (two) times daily. 90 tablet 11  . nitroGLYCERIN (NITROSTAT) 0.4 MG SL tablet Place 1 tablet (0.4 mg total) under the tongue every 5 (five) minutes as needed for chest pain. 30 tablet 12  . polyethylene glycol (MIRALAX) packet Take 17 g by mouth daily. 14 each 0  . Potassium Chloride ER 20 MEQ TBCR Take 40 mEq daily by mouth. 180 tablet 2  . rosuvastatin (CRESTOR) 40 MG tablet Take 1 tablet (40 mg total) by mouth daily. 90 tablet 3  . TRUEPLUS LANCETS 28G MISC Use as directed 100 each 12    Allergies:   Other and Levofloxacin   Social History   Socioeconomic History  . Marital status: Divorced    Spouse name: None  . Number of children: None  . Years of education: None  . Highest education level: None  Social Needs  . Financial resource strain: None  . Food insecurity - worry: None  . Food insecurity - inability: None  . Transportation needs - medical: None  . Transportation needs - non-medical: None  Occupational History  .  Occupation: Biochemist, clinical  Tobacco Use  . Smoking status: Former Research scientist (life sciences)  . Smokeless tobacco: Never Used  . Tobacco comment: quit 01/2017  Substance and Sexual Activity  . Alcohol use: No    Alcohol/week: 0.0 oz  . Drug use: No  . Sexual activity: None  Other Topics Concern  . None  Social History Narrative   Adopted. Very little info on biological parents, ?diabetes.   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 Diabetes Maternal Grandmother      ROS:   Please see the history of present illness.  All other systems are reviewed and otherwise negative.    PHYSICAL EXAM:   VS:  BP (!) 102/44 (BP Location: Right Arm)   Pulse 62   Temp 98.5 F (36.9 C) (Oral)   Resp 20   SpO2 97%   BMI: There is no height or weight on file to calculate BMI. GEN: Well nourished, well developed morbidly obese WM, in no acute distress  HEENT: normocephalic, atraumatic Neck: no JVD, carotid bruits, or masses Cardiac: RRR; no murmurs, rubs, or gallops, diffuse anasarca BLE, upper extremities, even shoulders back and abdomen feel tight Respiratory:  Diffusely diminished bilaterally, normal work of breathing GI: soft, nontender, nondistended, + BS MS: no deformity or atrophy  Skin: warm and dry, no rash Neuro:  Alert and Oriented x 3, Strength and sensation are intact, follows commands Psych: euthymic mood, full affect  Wt Readings from Last 3 Encounters:  05/13/17 (!) 306 lb (138.8 kg)  05/06/17 (!) 304 lb 1.9 oz (137.9 kg)  05/05/17 295 lb (133.8 kg)      Studies/Labs Reviewed:   EKG:  EKG was ordered today and personally reviewed by me and demonstrates NSR right axis deviation nonspecific ST-T changes Recent Labs: 07/09/2016: B Natriuretic Peptide 71.4 05/01/2017: ALT 24; Hemoglobin 11.7; Platelets 210 05/06/2017: BUN 19; Creatinine, Ser 1.77; NT-Pro BNP 95; Potassium 4.2; Sodium 139   Lipid Panel      Component Value Date/Time   CHOL 104 09/18/2016 1028   TRIG 76 09/18/2016 1028   HDL 39 (L) 09/18/2016 1028   CHOLHDL 2.7 09/18/2016 1028   CHOLHDL 2.9 07/10/2016  0416   VLDL 18 07/10/2016 0416   LDLCALC 50 09/18/2016 1028    Additional studies/ records that were reviewed today include: Summarized above.   ASSESSMENT & PLAN:   1. Anasarca in the context of acute kidney injury and possible acute on chronic diastolic CHF - the patient returns with even further weight gain and persistent diffuse anasarca despite outpatient diuretic titration. Prior dry weight was 293 and he is now 306lb. His diffuse edema pattern is not typical of CHF so in light of this and prior totally normal BNP, there is concern for an underlying systemic process linking together his fluid retention, recent renal decline, persistent leukocytosis, and diffuse arthralgias. I am concerned there will be progressive renal failure on labwork. There have been reports of acute renal failure with Jardiance so appreciate IM's input on this. He also remains on ACEI. May need to involve nephrology if creatinine has worsened to discuss use of IV diuretics. His blood pressure was soft in clinic and he was hypoxic with ambulation thus we have recommended going to the emergency room for further evaluation and probable admission by internal medicine, with cardiology following as consultants. Recommend repeat echocardiogram as Dr. Acie Fredrickson stated he would also be concerned for an infiltrative process such as amyloid. He saw and examined the patient with me. 2. Hypoxia with ambulation - will need evaluation for  home O2 during hospital stay. 3. Essential HTN - BP low by tech check, recheck by me 110/60. 4. CAD - prior stable workup as above. Continue aspirin, beta blocker, Plavix. If creatinine has worsened further, would consider changing Crestor to Atorvastatin.  Disposition: F/u TBD based on hospital course.   Medication Adjustments/Labs  and Tests Ordered: Current medicines are reviewed at length with the patient today.  Concerns regarding medicines are outlined above. Medication changes, Labs and Tests ordered today are summarized above and listed in the Patient Instructions accessible in Encounters.   Signed, Charlie Pitter, PA-C  05/13/2017 3:40 PM    [facility] [address], [place], [place]  [address] Phone: [phone]; Fax: [phone]   Attending Note:   The patient was seen and examined.  Agree with assessment and plan as noted above.  Changes made to the above note as needed.  Patient seen and independently examined with Melina Copa, PA.   We discussed all aspects of the encounter. I agree with the assessment and plan as stated above.  1.  Total body anasarca:  He has edema of all parts of his body including his back, shoulders, legs, abdomen.  This does not look like cardiac edema but rather due to progressive renal failure.  His skin has a very very leathery feel to it.  At this point he has increasing creatinine and progressive weight gain and has been unresponsive to oral diuretics.  I think he needs to be admitted to the hospital.  He needs IV Lasix for diuresis.  We need to keep a close eye on his kidney function and he will likely need a renal consult.  He had normal left ventricular function by echo in January.  He has known diastolic dysfunction.  2.  Obstructive sleep apnea: Further plans per internal medicine  3.  Morbid obesity:   I have spent a total of 40 minutes with patient reviewing hospital  notes , telemetry, EKGs, labs and examining patient as well as establishing an assessment and plan that was discussed with the patient. > 50% of  time was spent in direct patient care.    Thayer Headings, Brooke Bonito., MD, [facility] 05/13/2017, 4:30 PM [address]  [place] Pager [phone]

## 2017-05-13 NOTE — Progress Notes (Signed)
Patient says he normally takes 45 units of Lantus at night, [facility] he is scheduled 30 units. Says he will take the 30 units and see how it affects his blood glucose before he requests a change in medication.

## 2017-05-13 NOTE — Patient Instructions (Signed)
   The cardiologist has advised that you proceed to the emergency room for further evaluation. The concern is that despite increasing diuretic medicines, you have remained significantly swollen and your weight has gone up further, not down. Your most recent bloodwork showed that your kidney function was getting progressively worse. This is worrisome in light of your Jardiance use as well as Motrin. The cardiology team does not believe this swelling can be entirely attributed to heart failure and is worried about progressive kidney failure or a systemic process. Also, your oxygen level dropped to 85% after walking indicating low oxygen level. This will also need to be addressed. Dr. Elease HashimotoNahser [facility] recommends you be evaluated by the internal medicine team for admission, and the cardiology team will follow along.

## 2017-05-13 NOTE — ED Provider Notes (Signed)
[facility] EMERGENCY [facility]MENT Provider Note   CSN: [id number] Arrival date & time: 05/13/17  1514     History   Chief Complaint Chief Complaint  Patient presents with  . Shortness of Breath    HPI Marc Walker is a 46 y.o. male.  HPI Was sent from his cardiologist's office for hypoxia with exertion worsening renal function.  States he has had increasing dyspnea with any movement.  Continues to have swelling in all extremities and abdomen.  He is gained weight, roughly 10 pounds since the beginning of the month.  Was recently started on Lasix.  Patient continues to complain of diffuse arthralgias that are bilateral.  Denies any chest pain.  No fever or chills. Past Medical History:  Diagnosis Date  . Chronic diastolic CHF (congestive heart failure) (HCC) 03/09/2017   Echo 1/18: Mild LVH, EF 60-65, Gr 2 DD, mild LAE; difficult study-no obvious wall motion abnormalities with Definity  . Coronary artery disease    a. s/pprior myocardial infarction, s/p multipleprior stents placedat outside institutions with last intervention 08/[facility].  . Diabetes mellitus (HCC)   . Heartburn   . Hyperlipidemia associated with type 2 diabetes mellitus (HCC)   . Hypertension   . MI (myocardial infarction) (HCC) 07/2009   [facility] [place], [place] for [place] 2, 1st one in [place], [place]; total of 6 stents, last one 02/2014   . Morbid obesity (HCC) 07/06/2016  . OSA (obstructive sleep apnea) 07/06/2016   Moderate with AHI 21.8/hr by PSG 08/2012 now on CPAP  . Thyroid disease    past history  . Tobacco abuse     Patient Active Problem List   Diagnosis Date Noted  . Acute respiratory failure with hypoxia (HCC) 05/13/2017  . GERD (gastroesophageal reflux disease) 05/13/2017  . Depression 05/13/2017  . Acute renal failure superimposed on stage 2 chronic kidney disease (HCC) 05/13/2017  . Hypotension 05/13/2017  . Bilateral carpal tunnel syndrome 04/08/2017  .  Acute on chronic diastolic CHF (congestive heart failure) (HCC) 03/09/2017  . Erectile dysfunction 03/02/2017  . OSA on CPAP 07/06/2016  . Morbid obesity (HCC) 07/06/2016  . DM type 2 with diabetic peripheral neuropathy (HCC) 12/16/2015  . CAD (coronary artery disease) 09/30/2015  . Tobacco abuse 09/30/2015  . Essential hypertension 09/16/2015  . Hyperlipidemia   . History of MI (myocardial infarction) 07/30/2009    Past Surgical History:  Procedure Laterality Date  . APPENDECTOMY    . CARDIAC CATHETERIZATION N/A 09/16/2015   Procedure: Left Heart Cath and Coronary Angiography;  Surgeon: Runell GessJonathan J Berry, MD;  [facility];  Service: Cardiovascular;  Laterality: N/A;  . CARDIAC CATHETERIZATION N/A 09/16/2015   Procedure: Coronary Stent Intervention;  Surgeon: Runell GessJonathan J Berry, MD;  Location: [facility] INVASIVE CV LAB;  Service: Cardiovascular;  Laterality: N/A;  . CORONARY STENT PLACEMENT         Home Medications    Prior to Admission medications   Medication Sig Start Date End Date Taking? Authorizing Provider  amLODipine (NORVASC) 5 MG tablet Take 1 tablet (5 mg total) by mouth daily. 04/08/17  Yes [name], [name], MD  aspirin 81 MG tablet Take 1 tablet (81 mg total) by mouth daily. 11/17/16  Yes Langeland, Dawn T, MD  buPROPion (WELLBUTRIN SR) 150 MG 12 hr tablet Take 1 tablet (150 mg total) by mouth 2 (two) times daily. 04/06/17  Yes [name], [name], MD  clopidogrel (PLAVIX) 75 MG tablet Take 1  tablet (75 mg total) by mouth daily. 11/17/16  Yes Langeland, Dawn T, MD  empagliflozin (JARDIANCE) 10 MG TABS tablet Take 10 mg by mouth daily. 04/14/17  Yes Marcine Matar, MD  ezetimibe (ZETIA) 10 MG tablet Take 1 tablet (10 mg total) by mouth daily. 04/14/17  Yes Marcine Matar, MD  famotidine (PEPCID) 20 MG tablet Take 1 tablet (20 mg total) by mouth 2 (two) times daily. 09/01/16  Yes Langeland, Dawn T, MD  furosemide (LASIX) 80 MG tablet Take 1 tablet (80 mg total)  2 (two) times daily by mouth. 05/06/17 08/04/17 Yes Simmons, Brittainy M, PA-C  gabapentin (NEURONTIN) 300 MG capsule Take 3 capsules (900 mg total) by mouth 3 (three) times daily. 04/12/17  Yes Marcine Matar, MD  ibuprofen (ADVIL,MOTRIN) 800 MG tablet TAKE 1 TABLET BY MOUTH EVERY 8 HOURS AS NEEDED FOR MODERATE PAIN (TAKE WITH FOOD). 10/28/16  Yes Langeland, Dawn T, MD  Insulin Glargine (LANTUS SOLOSTAR) 100 UNIT/ML Solostar Pen Inject 45 Units into the skin 2 (two) times daily. 04/14/17  Yes Marcine Matar, MD  insulin lispro (HUMALOG) 100 UNIT/ML injection Inject 0.2 mLs (20 Units total) into the skin 3 (three) times daily with meals. 01/22/17  Yes Funches, Josalyn, MD  lisinopril (PRINIVIL,ZESTRIL) 10 MG tablet Take 1 tablet (10 mg total) by mouth 2 (two) times daily. 04/08/17  Yes Marcine Matar, MD  metFORMIN (GLUCOPHAGE-XR) 500 MG 24 hr tablet TAKE 2 TABLETS BY MOUTH 2 TIMES DAILY WITH A MEAL. Patient taking differently: Take 1,000 mg 2 (two) times daily by mouth. TAKE 2 TABLETS BY MOUTH 2 TIMES DAILY WITH A MEAL. 03/17/17  Yes Marcine Matar, MD  metoprolol tartrate (LOPRESSOR) 50 MG tablet Take 1 tablet (50 mg total) by mouth 2 (two) times daily. 11/17/16 05/13/17 Yes Langeland, Dawn T, MD  nitroGLYCERIN (NITROSTAT) 0.4 MG SL tablet Place 1 tablet (0.4 mg total) under the tongue every 5 (five) minutes as needed for chest pain. 09/17/15  Yes Rai, Ripudeep K, MD  polyethylene glycol (MIRALAX) packet Take 17 g by mouth daily. 12/06/16  Yes Kirichenko, Tatyana, PA-C  Potassium Chloride ER 20 MEQ TBCR Take 40 mEq daily by mouth. 05/06/17 08/04/17 Yes Simmons, Brittainy M, PA-C  rosuvastatin (CRESTOR) 40 MG tablet Take 1 tablet (40 mg total) by mouth daily. 11/17/16 05/13/17 Yes Pete Glatter, MD    Family History Family History  Adopted: Yes  Problem Relation Age of Onset  . Diabetes Maternal Grandmother     Social History Social History   Tobacco Use  . Smoking status: Former  Games developer  . Smokeless tobacco: Never Used  . Tobacco comment: quit 01/2017  Substance Use Topics  . Alcohol use: No    Alcohol/week: 0.0 oz  . Drug use: No     Allergies   Other and Levofloxacin   Review of Systems Review of Systems  Constitutional: Negative for chills and fever.  Respiratory: Positive for shortness of breath. Negative for cough.   Cardiovascular: Positive for leg swelling. Negative for chest pain and palpitations.  Gastrointestinal: Positive for abdominal distention. Negative for abdominal pain, constipation, diarrhea, nausea and vomiting.  Genitourinary: Negative for dysuria and flank pain.  Musculoskeletal: Positive for arthralgias and myalgias. Negative for back pain, neck pain and neck stiffness.  Skin: Negative for rash and wound.  Neurological: Positive for speech difficulty. Negative for dizziness, syncope, weakness, light-headedness, numbness and headaches.  All other systems reviewed and are negative.    Physical Exam  Updated Vital Signs BP (!) 92/55   Pulse 67   Temp 98.5 F (36.9 C) (Oral)   Resp 16   SpO2 91%   Physical Exam  Constitutional: He is oriented to person, place, and time. He appears well-developed and well-nourished.  Non-toxic appearance. He does not appear ill.  HENT:  Head: Normocephalic and atraumatic.  Mouth/Throat: Oropharynx is clear and moist.  Periorbital edema  Eyes: EOM are normal. Pupils are equal, round, and reactive to light.  Neck: Normal range of motion. Neck supple. No thyromegaly present.  Cardiovascular: Normal rate and regular rhythm.  Pulmonary/Chest: Effort normal. No accessory muscle usage. No tachypnea. No respiratory distress. He has decreased breath sounds.  Abdominal: Soft. Bowel sounds are normal. He exhibits distension. There is no tenderness. There is no rebound and no guarding.  Musculoskeletal: Normal range of motion. He exhibits no tenderness.       Right lower leg: He exhibits edema.       Left  lower leg: He exhibits edema.  Bilateral upper and lower extremity edema.  No obvious joint swelling, erythema or warmth.  Neurological: He is alert and oriented to person, place, and time.  Coarse sounding voice.  5/5 motor in all extremities.  Sensation fully intact.  Brisk patellar DTRs with delayed relaxation phase.  Skin: Skin is warm and dry. No rash noted. No erythema.  Psychiatric: He has a normal mood and affect. His behavior is normal.  Nursing note and vitals reviewed.    ED Treatments / Results  Labs (all labs ordered are listed, but only abnormal results are displayed) Labs Reviewed  BASIC METABOLIC PANEL - Abnormal; Notable for the following components:      Result Value   Chloride 97 (*)    Glucose, Bld 232 (*)    BUN 26 (*)    Creatinine, Ser 1.82 (*)    GFR calc non Af Amer 43 (*)    GFR calc Af Amer 49 (*)    All other components within normal limits  CBC - Abnormal; Notable for the following components:   WBC 12.6 (*)    RBC 3.79 (*)    Hemoglobin 11.9 (*)    HCT 36.0 (*)    All other components within normal limits  URINALYSIS, ROUTINE W REFLEX MICROSCOPIC - Abnormal; Notable for the following components:   Color, Urine STRAW (*)    Glucose, UA >=500 (*)    Bacteria, UA RARE (*)    Squamous Epithelial / LPF 0-5 (*)    All other components within normal limits  T4, FREE - Abnormal; Notable for the following components:   Free T4 <0.25 (*)    All other components within normal limits  TSH  BRAIN NATRIURETIC PEPTIDE  HEPATIC FUNCTION PANEL  I-STAT TROPONIN, ED    EKG  EKG Interpretation None       Radiology Dg Chest 2 View  Result Date: 05/13/2017 CLINICAL DATA:  Acute shortness of breath for 3 weeks. Fluid overload. EXAM: CHEST  2 VIEW COMPARISON:  04/25/2017 and prior chest radiographs FINDINGS: Upper limits normal heart size and mild chronic peribronchial thickening again noted. There is no evidence of focal airspace disease, pulmonary edema,  suspicious pulmonary nodule/mass, pleural effusion, or pneumothorax. No acute bony abnormalities are identified. IMPRESSION: 1. No evidence of acute cardiopulmonary disease. 2. Mild chronic peribronchial thickening. Electronically Signed   By: Harmon PierJeffrey  Hu M.D.   On: 05/13/2017 16:10    Procedures Procedures (including critical care time)  Medications Ordered in ED Medications  furosemide (LASIX) injection 80 mg (80 mg Intravenous Given 05/13/17 1807)  gi cocktail (Maalox,Lidocaine,Donnatal) (30 mLs Oral Given 05/13/17 2003)     Initial Impression / Assessment and Plan / ED Course  I have reviewed the triage vital signs and the nursing notes.  Pertinent labs & imaging results that were available during my care of the patient were reviewed by me and considered in my medical decision making (see chart for details).     Concern for profound hypothyroidism.  Patient states he has been on levothyroxine in the past but is no longer taking this.   Patient maintains normal mental status.  Blood pressure has been borderline hypotensive.  Discussed with hospitalist who will see patient in the emergency department and admit. Final Clinical Impressions(s) / ED Diagnoses   Final diagnoses:  Hypothyroidism, unspecified type  AKI (acute kidney injury) [facility])    ED Discharge Orders    None       Loren Racer, MD 05/13/17 2123

## 2017-05-14 ENCOUNTER — Other Ambulatory Visit (HOSPITAL_COMMUNITY): Payer: Self-pay

## 2017-05-14 ENCOUNTER — Inpatient Hospital Stay (HOSPITAL_COMMUNITY): Payer: Self-pay

## 2017-05-14 ENCOUNTER — Telehealth: Payer: Self-pay

## 2017-05-14 ENCOUNTER — Telehealth: Payer: Self-pay | Admitting: Internal Medicine

## 2017-05-14 DIAGNOSIS — I509 Heart failure, unspecified: Secondary | ICD-10-CM

## 2017-05-14 DIAGNOSIS — R9431 Abnormal electrocardiogram [ECG] [EKG]: Secondary | ICD-10-CM | POA: Diagnosis present

## 2017-05-14 DIAGNOSIS — I472 Ventricular tachycardia: Secondary | ICD-10-CM

## 2017-05-14 DIAGNOSIS — R000 Tachycardia, unspecified: Secondary | ICD-10-CM

## 2017-05-14 HISTORY — DX: Abnormal electrocardiogram (ECG) (EKG): R94.31

## 2017-05-14 HISTORY — DX: Ventricular tachycardia: I47.2

## 2017-05-14 HISTORY — DX: Tachycardia, unspecified: R00.0

## 2017-05-14 LAB — ECHOCARDIOGRAM COMPLETE
AVLVOTPG: 5 mmHg
CHL CUP DOP CALC LVOT VTI: 27.4 cm
E/e' ratio: 8.31
FS: 39 % (ref 28–44)
HEIGHTINCHES: 65 in
IVS/LV PW RATIO, ED: 1
LA ID, A-P, ES: 37 mm
LA diam index: 1.58 cm/m2
LA vol A4C: 61.2 ml
LA vol index: 25.5 mL/m2
LA vol: 59.7 mL
LEFT ATRIUM END SYS DIAM: 37 mm
LV E/e'average: 8.31
LV PW d: 12 mm — AB (ref 0.6–1.1)
LV TDI E'MEDIAL: 6.19
LV e' LATERAL: 10.2 cm/s
LVEEMED: 8.31
LVOT area: 3.14 cm2
LVOTD: 20 mm
LVOTPV: 117 cm/s
LVOTSV: 86 mL
MV Peak grad: 3 mmHg
MV pk A vel: 67.8 m/s
MV pk E vel: 84.8 m/s
RV LATERAL S' VELOCITY: 13.6 cm/s
RV TAPSE: 24.2 mm
TDI e' lateral: 10.2
WEIGHTICAEL: 4756.8 [oz_av]

## 2017-05-14 LAB — GLUCOSE, CAPILLARY
GLUCOSE-CAPILLARY: 242 mg/dL — AB (ref 65–99)
GLUCOSE-CAPILLARY: 304 mg/dL — AB (ref 65–99)
Glucose-Capillary: 250 mg/dL — ABNORMAL HIGH (ref 65–99)
Glucose-Capillary: 274 mg/dL — ABNORMAL HIGH (ref 65–99)

## 2017-05-14 LAB — BASIC METABOLIC PANEL
ANION GAP: 12 (ref 5–15)
BUN: 29 mg/dL — AB (ref 6–20)
CALCIUM: 9.1 mg/dL (ref 8.9–10.3)
CO2: 26 mmol/L (ref 22–32)
Chloride: 97 mmol/L — ABNORMAL LOW (ref 101–111)
Creatinine, Ser: 1.89 mg/dL — ABNORMAL HIGH (ref 0.61–1.24)
GFR calc Af Amer: 47 mL/min — ABNORMAL LOW (ref >60)
GFR, EST NON AFRICAN AMERICAN: 41 mL/min — AB (ref >60)
GLUCOSE: 256 mg/dL — AB (ref 65–99)
Potassium: 3.8 mmol/L (ref 3.5–5.1)
SODIUM: 135 mmol/L (ref 135–145)

## 2017-05-14 LAB — RPR: RPR: NONREACTIVE

## 2017-05-14 LAB — MAGNESIUM: MAGNESIUM: 2.7 mg/dL — AB (ref 1.7–2.4)

## 2017-05-14 LAB — CORTISOL-AM, BLOOD: CORTISOL - AM: 34 ug/dL — AB (ref 6.7–22.6)

## 2017-05-14 LAB — TROPONIN I: Troponin I: <0.03 ng/mL (ref <0.03)

## 2017-05-14 LAB — LACTIC ACID, PLASMA
LACTIC ACID, VENOUS: 2.4 mmol/L — AB (ref 0.5–1.9)
Lactic Acid, Venous: 1.2 mmol/L (ref 0.5–1.9)

## 2017-05-14 LAB — HIV ANTIBODY (ROUTINE TESTING W REFLEX): HIV SCREEN 4TH GENERATION: NONREACTIVE

## 2017-05-14 MED ORDER — INSULIN ASPART 100 UNIT/ML ~~LOC~~ SOLN
0.0000 [IU] | Freq: Three times a day (TID) | SUBCUTANEOUS | Status: DC
  Administered 2017-05-14: 3 [IU] via SUBCUTANEOUS

## 2017-05-14 MED ORDER — MAGNESIUM SULFATE 2 GM/50ML IV SOLN
2.0000 g | Freq: Once | INTRAVENOUS | Status: DC
  Filled 2017-05-14: qty 50

## 2017-05-14 MED ORDER — FUROSEMIDE 10 MG/ML IJ SOLN
40.0000 mg | Freq: Two times a day (BID) | INTRAMUSCULAR | Status: DC
  Administered 2017-05-14 – 2017-05-15 (×2): 40 mg via INTRAVENOUS
  Filled 2017-05-14 (×2): qty 4

## 2017-05-14 MED ORDER — LEVOTHYROXINE SODIUM 50 MCG PO TABS: 50.0000 ug | ORAL_TABLET | Freq: Every day | ORAL | Status: DC

## 2017-05-14 MED ORDER — INSULIN ASPART 100 UNIT/ML ~~LOC~~ SOLN
0.0000 [IU] | Freq: Every day | SUBCUTANEOUS | Status: DC
  Administered 2017-05-14: 3 [IU] via SUBCUTANEOUS

## 2017-05-14 MED ORDER — LEVOTHYROXINE SODIUM 50 MCG PO TABS
50.0000 ug | ORAL_TABLET | Freq: Every day | ORAL | Status: DC
  Administered 2017-05-15 – 2017-05-16 (×2): 50 ug via ORAL
  Filled 2017-05-14 (×2): qty 1

## 2017-05-14 MED ORDER — LEVOTHYROXINE SODIUM 100 MCG IV SOLR
100.0000 ug | Freq: Once | INTRAVENOUS | Status: AC
  Administered 2017-05-14: 100 ug via INTRAVENOUS
  Filled 2017-05-14: qty 5

## 2017-05-14 MED ORDER — LEVOTHYROXINE SODIUM 100 MCG IV SOLR
25.0000 ug | Freq: Every day | INTRAVENOUS | Status: DC
  Filled 2017-05-14: qty 5

## 2017-05-14 NOTE — Progress Notes (Signed)
Progress Note  Patient Name: Marc Walker Date of Encounter: 05/14/2017  Primary Cardiologist: Ross/Turner  Subjective   Pt denies chest pain. Clothes do not fit  Inpatient Medications    Scheduled Meds: . aspirin  81 mg Oral Daily  . buPROPion  150 mg Oral BID  . clopidogrel  75 mg Oral Daily  . enoxaparin (LOVENOX) injection  70 mg Subcutaneous Q24H  . ezetimibe  10 mg Oral Daily  . famotidine  20 mg Oral BID  . furosemide  80 mg Intravenous BID  . gabapentin  900 mg Oral TID  . insulin aspart  0-9 Units Subcutaneous TID WC  . insulin glargine  30 Units Subcutaneous BID  . [START ON 05/15/2017] levothyroxine  50 mcg Oral QAC breakfast  . metoprolol tartrate  50 mg Oral BID  . polyethylene glycol  17 g Oral Daily  . rosuvastatin  40 mg Oral Daily  . sodium chloride flush  3 mL Intravenous Q12H   Continuous Infusions: . sodium chloride     PRN Meds: sodium chloride, acetaminophen, hydrALAZINE, nitroGLYCERIN, ondansetron (ZOFRAN) IV, sodium chloride flush, zolpidem   Vital Signs    Vitals:   05/14/17 0605 05/14/17 0816 05/14/17 0818 05/14/17 0821  BP: (!) 90/56 (!) 77/52 (!) 78/40 (!) 90/52  Pulse: 69 63    Resp: 16 18    Temp: (!) 97.5 F (36.4 C) 97.8 F (36.6 C)    TempSrc: Oral Oral    SpO2: 98% 95%    Weight: 297 lb 4.8 oz (134.9 kg)     Height:        Intake/Output Summary (Last 24 hours) at 05/14/2017 [id number] Last data filed at 05/14/2017 [id number] Gross per 24 hour  Intake 1180 ml  Output 950 ml  Net 230 ml   Filed Weights   05/13/17 2305 05/14/17 0605  Weight: 297 lb 6.4 oz (134.9 kg) 297 lb 4.8 oz (134.9 kg)     Physical Exam   General: morbidly obese, male appearing in no acute distress with total body anasarca Head: Normocephalic, atraumatic.  Neck: Supple without bruits, no JVD, exam difficult Lungs:  Resp regular and unlabored, CTA but diminished throughout Heart: RRR, S1, S2, no murmur; no rub. Abdomen: Soft, non-tender, non-distended  with normoactive bowel sounds. No hepatomegaly. No rebound/guarding. No obvious abdominal masses. Extremities: No clubbing, cyanosis, trace LE edema. Distal pedal pulses are 1+ bilaterally. Neuro: Alert and oriented X 3. Moves all extremities spontaneously. Psych: Normal affect.  Labs    Chemistry Recent Labs  Lab 05/13/17 1547 05/13/17 2141 05/14/17 0230  NA 137  --  135  K 3.9  --  3.8  CL 97*  --  97*  CO2 32  --  26  GLUCOSE 232*  --  256*  BUN 26*  --  29*  CREATININE 1.82*  --  1.89*  CALCIUM 9.4  --  9.1  PROT  --  7.4  --   ALBUMIN  --  4.0  --   AST  --  158*  --   ALT  --  132*  --   ALKPHOS  --  68  --   BILITOT  --  0.3  --   GFRNONAA 43*  --  41*  GFRAA 49*  --  47*  ANIONGAP 8  --  12     Hematology Recent Labs  Lab 05/13/17 1547  WBC 12.6*  RBC 3.79*  HGB 11.9*  HCT 36.0*  MCV 95.0  MCH 31.4  MCHC 33.1  RDW 13.8  PLT 211    Cardiac Enzymes Recent Labs  Lab 05/13/17 2130 05/14/17 0230  TROPONINI <0.03 <0.03    Recent Labs  Lab 05/13/17 1556  TROPIPOC 0.01     BNP Recent Labs  Lab 05/13/17 2141  BNP 24.2     DDimer No results for input(s): DDIMER in the last 168 hours.   Radiology    Dg Chest 2 View  Result Date: 05/13/2017 CLINICAL DATA:  Acute shortness of breath for 3 weeks. Fluid overload. EXAM: CHEST  2 VIEW COMPARISON:  04/25/2017 and prior chest radiographs FINDINGS: Upper limits normal heart size and mild chronic peribronchial thickening again noted. There is no evidence of focal airspace disease, pulmonary edema, suspicious pulmonary nodule/mass, pleural effusion, or pneumothorax. No acute bony abnormalities are identified. IMPRESSION: 1. No evidence of acute cardiopulmonary disease. 2. Mild chronic peribronchial thickening. Electronically Signed   By: Harmon PierJeffrey  Hu M.D.   On: 05/13/2017 16:10    Telemetry    Sinus rhythm, three bouts of wide complex tachycardia - Personally Reviewed  ECG    Sinus, flattened T  waves, prolonged QTc - Personally Reviewed   Cardiac Studies   Echo 05/14/17: pending  Echo 07/10/16: Study Conclusions - Left ventricle: The cavity size was normal. Wall thickness was   increased in a pattern of mild LVH. Systolic function was normal.   The estimated ejection fraction was in the range of 60% to 65%.   Features are consistent with a pseudonormal left ventricular   filling pattern, with concomitant abnormal relaxation and   increased filling pressure (grade 2 diastolic dysfunction). - Left atrium: The atrium was mildly dilated. - Impressions: Technically limited study due to poor sound wave   transmission. No obvious wall motion abnormalities with Definity   contrast imaging.  Impressions: - Technically limited study due to poor sound wave transmission. No   obvious wall motion abnormalities with Definity contrast imaging.   Myoview 03/26/16:  Nuclear stress EF: 55%.  There was no ST segment deviation noted during stress.  Defect 1: There is a medium defect of mild severity present in the basal inferolateral, mid inferolateral and apical inferior location.  Findings consistent with ischemia.  This is a low risk study.   Low risk stress nuclear study with mild ischemia in the distribution of a distal OM or PLA branch. Normal left ventricular regional and global systolic function.   Left heart cath 09/16/15: s/p successful proximal LAD PCI and drug-eluting stenting as well as mid RCA along with Angiosculpt balloon angioplasty of the proximal segmental PDA "in-stent restenosis. Patent Ost LAD to Prox LAD stent (unknown type). Patent Ost 2nd Mrg to 2nd Mrg stent (unknown type). 20% in-stenosed Mid RCA to Dist RCA lesion (unknown type). 20% in-stenosed Ost RPDA to RPDA lesion (unknown type). Mid Cx lesion, 70% stenosed.  Patient Profile     46 y.o. male with history of CAD s/pprior myocardial infarction, s/p multipleprior stents placedat outside institutions  with [facility], DM, HTN, HL, tobacco abuse, OSA, chronic diastolic HF, morbid obesity who presented to office for f/u and was [facility].   Assessment & Plan    1. Acute on chronic diastolic heart failure, total body anasarca - pt was admitted and started on IV diuresis - 80 mg IV BID - home regimen includes lopressor, ASA, plavix.and statin - pt weight is up to 297 lbs - previous pBNP was  normal on 05/06/17 - BNP normal this admission - will decrease lasix as this volume is likely related to his profound hypothyroidism; high dose of lasix will likely worsen his renal function - decrease to 40 mg IV BID and continue to monitor   2. Prolonged QTc (484 ms) - offending medications include pepcid, lasix, and zofran. I D/C'ed zofran and pepcid for now and decreased lasix - continue to monitor and avoid prolonging agents   3. Wide complex tachycardia - appreciated on telemetry - pt was asymptomatic - ordered magnesium - 12-lead pending   4. AKI - sCr 1.89 (1.82), baseline  - normal creatinine 06/2016 - 09/2016 - was 1.36 on 04/25/17 - now increasing on this admission 1.82 --> 1.89 - consider renal consult   5. Elevated TSH - 129.821 - diagnosed hypothyroidism 7-8 years ago, not compliant on synthroid secondary to cost - consider endocrinology follow up   This is a complex patient with total body fluid overload that is not necessarily classic for congestive heart failure exacerbation. Given his declining kidney function, elevated TSH, and body arthralgias, other systemic causes should be investigated. He has not taken synthroid for his previously diagnosed hypothyroidism in several years. Primary team restarting synthroid.    Signed, Roe Rutherford Duke , PA-C 9:22 AM 05/14/2017 Pager: [phone]  History and all data above reviewed.  Patient examined.  I agree with the findings as above.  He is not in acute distress.  No  acute SOB.  Complains of back pain.   The patient exam reveals COR:RRR  ,  Lungs: Clear  ,  Abd: Positive bowel sounds, no rebound no guarding, Ext moderate nonpitting edema   .  All available labs, radiology testing, previous records reviewed. Agree with documented assessment and plan. Anasarca:  Agree this is likely related to the hypothyroidism and aggressive diuresis might be counterproductive.  Management of the thyroid, help with his medications from a cost perspective and getting him appropriate treatment of his sleep apnea will be the most effective therapies.  Agree with suggested diuretic changes as above.    Knight Earon Rivest  12:53 PM  05/14/2017

## 2017-05-14 NOTE — Progress Notes (Signed)
  Echocardiogram 2D Echocardiogram has been performed.  Delcie RochENNINGTON, Arlene Genova 05/14/2017, 5:13 PM

## 2017-05-14 NOTE — Progress Notes (Signed)
Patient sitting in recliner and refused chair alarm. Visitor @ bedside. Will attempt to put bed alarm on when patient gets in bed.

## 2017-05-14 NOTE — Clinical Social Work Note (Signed)
CSW acknowledges consult regarding patient not having insurance and not being able to pay for medications. CSW Patent examineremailed financial counselor regarding insurance issue. Will notify RNCM in morning progression about difficulty affording medications.  CSW signing off. Consult again if any social work needs arise.  Marc CourtSarah Wylan Walker, CSW [phone]

## 2017-05-14 NOTE — Progress Notes (Signed)
Nutrition Education Note  Pt is a 46 y.o. male with a PMH of chronic diastolic CHF, hypertension, hyperlipidemia, diabetes, GERD, depression, OSA on CPAP, morbid obesity, CAD/MI status post stent, stage II on a kidney disease, hypothyroidism, and history of tobacco abuse who was admitted 05/13/17 for evaluation of worsening shortness of breath associated with bilateral leg edema, weight gain and joint pain.  RD intern consulted for education regarding a healthful diet and weight loss management.   Discussed and provided pt with Academy of Nutrition and Dietetics handouts, "General, Healthful Nutrition Therapy" and "Label Reading Tips". Reviewed pt's dietary recall. He lives with his mother who cooks for him. Says that she is a typical "Ash[place] cook" and fries meats and cooks vegetables with salt. He said she is supportive of his health and has been trying to make changes. They have Mrs. Dash seasoning blends and have been beginning to use them.  Pt expressed distress over his weight being 300 lbs, however, he is open to making changes. His goal is to drink less soda and sweet tea. Provided examples of ways to balance meals/snacks and encouraged intake of fruits, vegetables, and high-fiber, whole grain complex carbohydrates. Pt had numerous questions throughout education about various foods and their effect on his health.  Current diet order is Renal with 1200 mL fluid restriction. Pt consumed 100% of his breakfast this morning. Labs and medications reviewed. No further nutrition interventions warranted at this time. If additional nutrition issues arise, please re-consult RD.  Wynetta EmeryGrace Herman [facility] Dietetic Intern Pager: [phone] 05/14/2017 4:13 PM

## 2017-05-14 NOTE — Telephone Encounter (Signed)
Met with patient while he's in the hospital. Introduced and explained to patient of the benefits and services provided through the [place] clinic at [facility]. Patient understood and agreed to be seen by [facility] for a 30 day period and then go back to Dr. Wynetta Emery. Appointment has been scheduled for Tuesday, November 20,2018 @ 9:45am.   During our conversation patient stated that he lives at his parent's house, a one story home. They are support his support system. His mom cooks for him and his father drives him to places sometimes. Patient is not working at the moment. He does receive food stamp $192 and currently applied for disability. Patient met with hospital financial counselor prior to my visit.   Patient also stated that he is not receiving any services at the moment, such as nurse or PT. Pt uses a CPAP machine but it is very old about 10 years. He is interested in getting a new one, if that's a possibility. Patient has a bathroom scale at home. Patient uses [facility] pharmacy for his medication. He stated that he has a balance of $500. Encouraged patient to make small payments when he can.  Patient asked if his other doctors will be notified or can read notes from his hospital stay. Informed patient that as long as providers use Epic and are within the [facility] network they can but if they aren't, he will have to sign a release of information form. Patient understood.   Patient confirmed that his address, phone number and emergency contact is correct as shown on Epic.

## 2017-05-14 NOTE — Plan of Care (Signed)
  Education: Ability to demonstrate management of disease process will improve 05/14/2017 0256 - Progressing by Jeanella Flatteryhomas, Elsi Stelzer T, RN

## 2017-05-14 NOTE — Progress Notes (Signed)
Inpatient Diabetes Program Recommendations  AACE/ADA: New Consensus Statement on Inpatient Glycemic Control (2015)  Target Ranges:  Prepandial:   less than 140 mg/dL      Peak postprandial:   less than 180 mg/dL (1-2 hours)      Critically ill patients:  140 - 180 mg/dL  Results for Marc Walker, Marc Walker (MRN [id number]) as of 05/14/2017 08:21  Ref. Range 05/13/2017 23:15 05/14/2017 07:42  Glucose-Capillary Latest Ref Range: 65 - 99 mg/dL 409242 (H) 811242 (H)    Review of Glycemic Control  Diabetes history: DM2 Outpatient Diabetes medications: Jardiance 10 mg daily, Lantus 45 units BID, Humalog 20 units TID with meals, Metformin XR 1000 mg BID Current orders for Inpatient glycemic control: Lantus 30 units BID, Novolog 0-9 units TID with meals  Inpatient Diabetes Program Recommendations: Correction (SSI): Please consider ordering Novolog 0-5 units QHS for bedtime correction scale.  Thanks, Orlando PennerMarie Kodah Maret, RN, MSN, CDE Diabetes Coordinator Inpatient Diabetes Program [phone] (Team Pager from 8am to 5pm)

## 2017-05-14 NOTE — Progress Notes (Signed)
BP 77/52, 78/40, HR 63 in Left arm. BP 90/52, HR 63 in Right arm. Per Dr. Darnelle Catalanama, do not give this AM dose of metoprolol 50mg  PO. Pt asymptomatic and stable.

## 2017-05-14 NOTE — Progress Notes (Addendum)
Spent approx 30 minutes educating pt on the CHF issues of intaking too many fluids, as pt is currently on a Fluid Restriction.   Pt continues to be noncompliant.

## 2017-05-14 NOTE — Progress Notes (Signed)
Lactic acid 2.4, MD paged to verify repeat lactic acid draw.

## 2017-05-14 NOTE — Care Management Note (Signed)
Case Management Note  Patient Details  Name: Marc MortimerJames Reasner MRN: [id number] Date of Birth: December 31, 1970  Subjective/Objective:    CHF               Action/Plan: Patient lives at home with his parents; [facility], he can get his medication at their pharmacy or [facility]; Financial Counselor to see patient to see what he might qualify for; CM will continue to follow for DCP  Expected Discharge Date:  Possibly 05/18/2017              Expected Discharge Plan:  Home/Self Care  Discharge planning Services  CM Consult  Status of Service:  In process, will continue to follow  Reola MosherChandler, Abhiram Criado L, RN,MHA,BSN [phone] 05/14/2017, 1:33 PM

## 2017-05-14 NOTE — Progress Notes (Signed)
Progress Note    Marc Walker  ZOX:[id number] DOB: [birth year]  DOA: 05/13/2017 PCP: [name], [name], MD    Brief Narrative:   Chief complaint: F/U SOB  Medical records reviewed and are as summarized below:  Marc Walker is an 46 y.o. male with a PMH of chronic diastolic CHF, hypertension, hyperlipidemia, diabetes, GERD, depression, OSA on CPAP, morbid obesity, CAD/MI status post stent, stage II on a kidney disease, hypothyroidism, and history of tobacco abuse who was admitted 05/13/17 for evaluation of worsening shortness of breath associated with bilateral leg edema, weight gain and joint pain. In the ED, chest x-ray showed chronic peribronchial thickening.  Assessment/Plan:   Principal Problems:   Acute on chronic diastolic CHF (congestive heart failure) (HCC)/anasarca/acute respiratory failure with hypoxia Dry weight noted to be 293 pounds. Current weight 297 pounds. Currently on Lasix 80 mg twice a day, which has subsequently been decreased by cardiology given the etiology of his anasarca likely from hypothyroidism.    Severe hypothyroidism Has not taken any thyroid medication for 7-8 years. Suspect this is contributing to his shortness of breath. Hypoventilation can result from respiratory muscle weakness and hypothyroidism can be associated with reduced pulmonary response to hypoxia/hypercapnia. Hypothyroidism can also worsen sleep apnea, which he already has.  Active Problems:   Wide complex tachycardia Was appreciated on telemetry, patient was asymptomatic at that time. Magnesium 2.7.    Prolonged QTc (484 ms) Offending medications include pepcid, lasix, and zofran. Zofran and pepcid discontinued and decreased lasix dose per cardiology. Continue to monitor and avoid prolonging agents.    Essential hypertension Continue Lasix and metoprolol. Hydralazine ordered as needed.    Hyperlipidemia Continue Zetia and Crestor.    CAD (coronary artery disease) Continue  aspirin/Plavix, metoprolol, statin. Nitroglycerin ordered as needed.    DM type 2 with diabetic peripheral neuropathy (HCC) Currently being managed with Lantus 30 units twice a day and sensitive scale SSI. At at bedtime coverage per diabetes coordinator recommendations.    OSA on CPAP Continue CPAP.    GERD (gastroesophageal reflux disease) Continue Pepcid.    Depression Continue Wellbutrin.    Acute renal failure superimposed on stage 2 chronic kidney disease (HCC) Baseline creatinine is 1.3-1.4.  Current creatinine slightly above baseline.    Joint pain Suspect this is a manifestation from untreated hypothyroidism. Continue Neurontin.    Morbid obesity Body mass index is 49.47 kg/m. Dietician consult for weight loss counseling.   Family Communication/Anticipated D/C date and plan/Code Status   DVT prophylaxis: Lovenox ordered. Code Status: Full Code.  Family Communication: No family at the bedside. Disposition Plan: Home when stable.   Medical Consultants:    Cardiology   Anti-Infectives:    None  Subjective:   Denies active chest pain, still a bit short of breath.  Objective:    Vitals:   05/14/17 0605 05/14/17 0816 05/14/17 0818 05/14/17 0821  BP: (!) 90/56 (!) 77/52 (!) 78/40 (!) 90/52  Pulse: 69 63    Resp: 16 18    Temp: (!) 97.5 F (36.4 C) 97.8 F (36.6 C)    TempSrc: Oral Oral    SpO2: 98% 95%    Weight: 134.9 kg (297 lb 4.8 oz)     Height:        Intake/Output Summary (Last 24 hours) at 05/14/2017 1429 Last data filed at 05/14/2017 1019 Gross per 24 hour  Intake 1090 ml  Output 950 ml  Net 140 ml   [employer] PowerFiled Weights  05/13/17 2305 05/14/17 0605  Weight: 134.9 kg (297 lb 6.4 oz) 134.9 kg (297 lb 4.8 oz)    Exam: General: Morbidly obese. Diffusely swollen/anasarca. Sitting up in chair. Cardiovascular: Heart sounds show a regular rate, and rhythm. No gallops or rubs. No murmurs. No JVD. Lungs: Decreased breath sounds but no rales.  Labored breathing. Abdomen: Soft, nontender, nondistended with normal active bowel sounds. No masses. No hepatosplenomegaly. Neurological: Alert and oriented 3. Moves all extremities 4 with equal strength. Cranial nerves II through XII grossly intact. Skin: Warm and dry. No rashes or lesions. Leathery appearance. Extremities: No clubbing or cyanosis. 3+ edema. Pedal pulses diminished. Psychiatric: Mood and affect are normal. Insight and judgment are poor.  Data Reviewed:   I have personally reviewed following labs and imaging studies:  Labs: Labs show the following:   Basic Metabolic Panel: Recent Labs  Lab 05/13/17 1547 05/14/17 0230 05/14/17 0914  NA 137 135  --   K 3.9 3.8  --   CL 97* 97*  --   CO2 32 26  --   GLUCOSE 232* 256*  --   BUN 26* 29*  --   CREATININE 1.82* 1.89*  --   CALCIUM 9.4 9.1  --   MG  --   --  2.7*   GFR Estimated Creatinine Clearance: 62.1 mL/min (A) (by C-G formula based on SCr of 1.89 mg/dL (H)). Liver Function Tests: Recent Labs  Lab 05/13/17 2141  AST 158*  ALT 132*  ALKPHOS 68  BILITOT 0.3  PROT 7.4  ALBUMIN 4.0   Coagulation profile Recent Labs  Lab 05/13/17 2130  INR 0.97    CBC: Recent Labs  Lab 05/13/17 1547  WBC 12.6*  HGB 11.9*  HCT 36.0*  MCV 95.0  PLT 211   Cardiac Enzymes: Recent Labs  Lab 05/13/17 2130 05/14/17 0230 05/14/17 0914  TROPONINI <0.03 <0.03 <0.03   BNP (last 3 results) Recent Labs    05/06/17 1509  PROBNP 95   CBG: Recent Labs  Lab 05/13/17 2315 05/14/17 0742 05/14/17 1149  GLUCAP 242* 242* 304*   Thyroid function studies: Recent Labs    05/13/17 2141  TSH 129.821*   Sepsis Labs: Recent Labs  Lab 05/13/17 1547 05/13/17 2130 05/13/17 2131 05/14/17 0045 05/14/17 0230  PROCALCITON  --  <0.10  --   --   --   WBC 12.6*  --   --   --   --   LATICACIDVEN  --   --  1.5 2.4* 1.2    Microbiology Recent Results (from the past 240 hour(s))  Culture, blood (x 2)     Status:  None (Preliminary result)   Collection Time: 05/13/17  9:55 PM  Result Value Ref Range Status   Specimen Description BLOOD LEFT ANTECUBITAL  Final   Special Requests   Final    BOTTLES DRAWN AEROBIC AND ANAEROBIC Blood Culture adequate volume   Culture NO GROWTH < 12 HOURS  Final   Report Status PENDING  Incomplete  Culture, blood (x 2)     Status: None (Preliminary result)   Collection Time: 05/13/17 10:53 PM  Result Value Ref Range Status   Specimen Description BLOOD RIGHT ANTECUBITAL  Final   Special Requests IN PEDIATRIC BOTTLE Blood Culture adequate volume  Final   Culture NO GROWTH < 12 HOURS  Final   Report Status PENDING  Incomplete    Procedures and diagnostic studies:  05/13/17: Dg Chest 2 View: My independent review of the image  shows: Cardiomegaly. No pneumonia or obvious CHF. No pleural effusions.    Medications:   . aspirin  81 mg Oral Daily  . buPROPion  150 mg Oral BID  . clopidogrel  75 mg Oral Daily  . enoxaparin (LOVENOX) injection  70 mg Subcutaneous Q24H  . ezetimibe  10 mg Oral Daily  . furosemide  40 mg Intravenous BID  . gabapentin  900 mg Oral TID  . insulin aspart  0-9 Units Subcutaneous TID WC  . insulin glargine  30 Units Subcutaneous BID  . [START ON 05/15/2017] levothyroxine  50 mcg Oral QAC breakfast  . metoprolol tartrate  50 mg Oral BID  . polyethylene glycol  17 g Oral Daily  . rosuvastatin  40 mg Oral Daily  . sodium chloride flush  3 mL Intravenous Q12H   Continuous Infusions: . sodium chloride    . magnesium sulfate 1 - 4 g bolus IVPB       LOS: 1 day   Hillery Aldo  [facility] Pager [phone]. If unable to reach me by pager, please call my cell phone at [phone].  *Please refer to [email or web address], password [email or web address] to get updated schedule on who will round on this patient, as hospitalists switch teams weekly. If 7PM-7AM, please contact night-coverage at [email or web address], password [email or web address] for any overnight  needs.  05/14/2017, 2:29 PM

## 2017-05-14 NOTE — Telephone Encounter (Signed)
The [facility] on 05/18/17 @ 0945 and the information was placed on the AVS.   Voicemail message left for Jiles CrockerBrenda Chandler, RN CM with this information.

## 2017-05-15 LAB — CK: CK TOTAL: 619 U/L — AB (ref 49–397)

## 2017-05-15 LAB — GLUCOSE, CAPILLARY
GLUCOSE-CAPILLARY: 208 mg/dL — AB (ref 65–99)
GLUCOSE-CAPILLARY: 178 mg/dL — AB (ref 65–99)
Glucose-Capillary: 199 mg/dL — ABNORMAL HIGH (ref 65–99)
Glucose-Capillary: 195 mg/dL — ABNORMAL HIGH (ref 65–99)

## 2017-05-15 LAB — BASIC METABOLIC PANEL
Anion gap: 12 (ref 5–15)
BUN: 33 mg/dL — ABNORMAL HIGH (ref 6–20)
CALCIUM: 8.5 mg/dL — AB (ref 8.9–10.3)
CO2: 27 mmol/L (ref 22–32)
CREATININE: 1.75 mg/dL — AB (ref 0.61–1.24)
Chloride: 97 mmol/L — ABNORMAL LOW (ref 101–111)
GFR, EST AFRICAN AMERICAN: 52 mL/min — AB (ref >60)
GFR, EST NON AFRICAN AMERICAN: 45 mL/min — AB (ref >60)
GLUCOSE: 205 mg/dL — AB (ref 65–99)
Potassium: 3.3 mmol/L — ABNORMAL LOW (ref 3.5–5.1)
Sodium: 136 mmol/L (ref 135–145)

## 2017-05-15 LAB — SEDIMENTATION RATE: SED RATE: 41 mm/h — AB (ref 0–16)

## 2017-05-15 LAB — T3, FREE: T3, Free: 1 pg/mL — ABNORMAL LOW (ref 2.0–4.4)

## 2017-05-15 MED ORDER — INSULIN GLARGINE 100 UNIT/ML ~~LOC~~ SOLN
35.0000 [IU] | Freq: Two times a day (BID) | SUBCUTANEOUS | Status: DC
  Administered 2017-05-15 – 2017-05-16 (×3): 35 [IU] via SUBCUTANEOUS
  Filled 2017-05-15 (×4): qty 0.35

## 2017-05-15 MED ORDER — POLYETHYLENE GLYCOL 3350 17 G PO PACK: 17.0000 g | PACK | Freq: Every day | ORAL | Status: DC | PRN

## 2017-05-15 MED ORDER — INSULIN ASPART 100 UNIT/ML ~~LOC~~ SOLN
0.0000 [IU] | Freq: Three times a day (TID) | SUBCUTANEOUS | Status: DC
  Administered 2017-05-15 – 2017-05-16 (×5): 4 [IU] via SUBCUTANEOUS

## 2017-05-15 MED ORDER — POTASSIUM CHLORIDE CRYS ER 20 MEQ PO TBCR
20.0000 meq | EXTENDED_RELEASE_TABLET | Freq: Four times a day (QID) | ORAL | Status: DC
  Administered 2017-05-15 – 2017-05-16 (×5): 20 meq via ORAL
  Filled 2017-05-15 (×5): qty 1

## 2017-05-15 MED ORDER — POTASSIUM CHLORIDE CRYS ER 20 MEQ PO TBCR
20.0000 meq | EXTENDED_RELEASE_TABLET | Freq: Two times a day (BID) | ORAL | Status: DC
  Administered 2017-05-15: 20 meq via ORAL
  Filled 2017-05-15: qty 1

## 2017-05-15 MED ORDER — FUROSEMIDE 10 MG/ML IJ SOLN
40.0000 mg | Freq: Four times a day (QID) | INTRAMUSCULAR | Status: DC
  Administered 2017-05-15 – 2017-05-16 (×4): 40 mg via INTRAVENOUS
  Filled 2017-05-15 (×4): qty 4

## 2017-05-15 MED ORDER — INSULIN ASPART 100 UNIT/ML ~~LOC~~ SOLN: 0.0000 [IU] | Freq: Every day | SUBCUTANEOUS | Status: DC

## 2017-05-15 NOTE — Progress Notes (Signed)
Per order written 05/15/17 @ 1004, cardiac monitoring has been discontinued.

## 2017-05-15 NOTE — Evaluation (Signed)
Physical Therapy Evaluation Patient Details Name: Marc Walker MRN: [id number] DOB: [birth year] Today's Date: 05/15/2017   History of Present Illness  Joedy Eickhoff is an 46 y.o. male admitted 05/13/17 for evaluation of worsening shortness of breath associated with bilateral leg edema, weight gain and joint pain.  Patient with CHF exacerbation, hypotension, AKI, total body anasarca.    PMH:  chronic diastolic CHF, hypertension, hyperlipidemia, diabetes, neuropathy, GERD, depression, OSA on CPAP, morbid obesity, CAD/MI status post stent, stage II on a kidney disease, hypothyroidism, and history of tobacco abuse  Clinical Impression  Patient presents with problems listed below.  Will benefit from acute PT to maximize functional mobility prior to discharge.  Patient with decreased strength, balance, and safety awareness, all impacting mobility/gait/safety.  Recommend f/u HHPT at discharge for continued therapy.    Follow Up Recommendations Home health PT;Supervision for mobility/OOB    Equipment Recommendations  Rolling walker with 5" wheels;3in1 (PT)    Recommendations for Other Services OT consult     Precautions / Restrictions Precautions Precautions: Fall Precaution Comments: Impulsive Restrictions Weight Bearing Restrictions: No      Mobility  Bed Mobility               General bed mobility comments: Patient in recliner as PT entered room.  Transfers Overall transfer level: Needs assistance Equipment used: Rolling walker (2 wheeled) Transfers: Sit to/from Stand Sit to Stand: Min assist         General transfer comment: Verbal cues for hand placement and safe technique.  Assist to steady and slow patient's movements.  Initially, patient stood and said "oh no" and abruptly sat in chair.  Reports back pain increased.  Ambulation/Gait Ambulation/Gait assistance: Min assist Ambulation Distance (Feet): 120 Feet(2 standing rest breaks) Assistive device: Rolling walker  (2 wheeled) Gait Pattern/deviations: Step-through pattern;Decreased stride length;Trunk flexed Gait velocity: decreased Gait velocity interpretation: Below normal speed for age/gender General Gait Details: Verbal cues for safe use of RW.  Encouraged RW for safety and to decrease back pain in gait.  Back pain did decrease, and patient c/o BUE pain.  Patient with RW too far ahead of himself, with flexed posture and max weight bearing through UE's.  Cues to stay close to RW and stand upright.  At one point, patient states "This walker doesn't roll right" and turned RW sideways during gait.  Immediately stopped patient and returned RW to safe position.  Stairs            Wheelchair Mobility    Modified Rankin (Stroke Patients Only)       Balance Overall balance assessment: Needs assistance Sitting-balance support: No upper extremity supported;Feet supported Sitting balance-Leahy Scale: Fair     Standing balance support: Single extremity supported Standing balance-Leahy Scale: Poor                               Pertinent Vitals/Pain Pain Assessment: 0-10 Pain Score: 10-Worst pain ever Pain Location: back and BLE's Pain Descriptors / Indicators: Aching;Burning;Sharp;Shooting Pain Intervention(s): Limited activity within patient's tolerance;Monitored during session;Repositioned    Home Living Family/patient expects to be discharged to:: Private residence Living Arrangements: Parent Available Help at Discharge: Family;Available 24 hours/day Type of Home: House Home Access: Ramped entrance     Home Layout: Able to live on main level with bedroom/bathroom;Laundry or work area in [employer] Equipment: Gilmer Mor - single point      Prior Function Level of Independence: Independent  with assistive device(s);Needs assistance   Gait / Transfers Assistance Needed: Patient primarily uses cane for gait, especially outside of home.  ADL's / Homemaking Assistance Needed:  Patient having difficulty with bathing due to pain in back with stance.  Patient does not drive - diplopia and patient falls asleep easily.        Hand Dominance        Extremity/Trunk Assessment   Upper Extremity Assessment Upper Extremity Assessment: RUE deficits/detail;LUE deficits/detail RUE Sensation: history of peripheral neuropathy LUE Sensation: history of peripheral neuropathy    Lower Extremity Assessment Lower Extremity Assessment: Generalized weakness;RLE deficits/detail;LLE deficits/detail RLE Sensation: history of peripheral neuropathy LLE Sensation: history of peripheral neuropathy    Cervical / Trunk Assessment Cervical / Trunk Assessment: Other exceptions Cervical / Trunk Exceptions: Back pain moving into LE's impacts mobility and gait.  Communication   Communication: Expressive difficulties(Somewhat difficult to understand)  Cognition Arousal/Alertness: Awake/alert Behavior During Therapy: Anxious;Impulsive;Restless Overall Cognitive Status: No family/caregiver present to determine baseline cognitive functioning Area of Impairment: Attention;Safety/judgement;Following commands                   Current Attention Level: Sustained   Following Commands: Follows one step commands inconsistently(Does not wait for instructions, and moves unsafely.) Safety/Judgement: Decreased awareness of safety;Decreased awareness of deficits     General Comments: Patient repeats "I won't fall".  However demonstrates impulsive, unsafe behavior. - ? present pta?      General Comments      Exercises     Assessment/Plan    PT Assessment Patient needs continued PT services  PT Problem List Decreased strength;Decreased activity tolerance;Decreased balance;Decreased mobility;Decreased cognition;Decreased safety awareness;Decreased knowledge of use of DME;Cardiopulmonary status limiting activity;Impaired sensation;Obesity;Pain       PT Treatment Interventions DME  instruction;Gait training;Functional mobility training;Therapeutic activities;Therapeutic exercise;Balance training;Cognitive remediation;Patient/family education    PT Goals (Current goals can be found in the Care Plan section)  Acute Rehab PT Goals Patient Stated Goal: To return home PT Goal Formulation: With patient Time For Goal Achievement: 05/22/17 Potential to Achieve Goals: Fair    Frequency Min 3X/week   Barriers to discharge        Co-evaluation               AM-PAC PT "6 Clicks" Daily Activity  Outcome Measure Difficulty turning over in bed (including adjusting bedclothes, sheets and blankets)?: None Difficulty moving from lying on back to sitting on the side of the bed? : A Little Difficulty sitting down on and standing up from a chair with arms (e.g., wheelchair, bedside commode, etc,.)?: Unable Help needed moving to and from a bed to chair (including a wheelchair)?: A Little Help needed walking in hospital room?: A Little Help needed climbing 3-5 steps with a railing? : A Lot 6 Click Score: 16    End of Session Equipment Utilized During Treatment: Gait belt Activity Tolerance: Patient tolerated treatment well;Patient limited by pain Patient left: in chair;with call bell/phone within reach(Attempted to set chair alarm. Patient with alarm in floor.) Nurse Communication: Mobility status PT Visit Diagnosis: Unsteadiness on feet (R26.81);Other abnormalities of gait and mobility (R26.89);Muscle weakness (generalized) (M62.81);Pain Pain - part of body: (Back and BLE's)    Time: [id number] PT Time Calculation (min) (ACUTE ONLY): 37 min   Charges:   PT Evaluation $PT Eval Moderate Complexity: 1 Mod PT Treatments $Gait Training: 8-22 mins   PT G Codes:        Durenda HurtSusan H. Earlene Plateravis, PT, MBA Acute Rehab  Services Pager [phone]   Vena AustriaSusan H Elenna Spratling 05/15/2017, 9:05 PM

## 2017-05-15 NOTE — Progress Notes (Signed)
Progress Note    Marc Walker  ZOX:[id number] DOB: Dec 16, 1970  DOA: 05/13/2017 PCP: Marc Walker, Marc B, MD    Brief Narrative:   Chief complaint: F/U SOB  Medical records reviewed and are as summarized below:  Marc Walker is an 46 y.o. male with a PMH of chronic diastolic CHF, hypertension, hyperlipidemia, diabetes, GERD, depression, OSA on CPAP, morbid obesity, CAD/MI status post stent, stage II on a kidney disease, hypothyroidism, and history of tobacco abuse who was admitted 05/13/17 for evaluation of worsening shortness of breath associated with bilateral leg edema, weight gain and joint pain. In the ED, chest x-ray showed chronic peribronchial thickening.  Assessment/Plan:   Principal Problems:   Acute on chronic diastolic CHF (congestive heart failure) (HCC)/anasarca/acute respiratory failure with hypoxia Dry weight noted to be 293 pounds. Current weight 299 pounds.  Anasarca felt to be from a combination of hypothyroidism and diastolic CHF. Currently on Lasix 40 mg twice a day. Cardiologist has increased his Lasix to 40 mg IV every 6 hours due to 2 lb weight gain over the past 24 hours.    Severe hypothyroidism Has not taken any thyroid medication for 7-8 years. Suspect this is contributing to his shortness of breath. Hypoventilation can result from respiratory muscle weakness and hypothyroidism can be associated with reduced pulmonary response to hypoxia/hypercapnia. Hypothyroidism can also worsen sleep apnea, which he already has. Continue Synthroid.  Active Problems:   Hypokalemia Secondary to Lasix. Add 20 mEq of potassium chloride twice a day.    Wide complex tachycardia Was appreciated on telemetry, patient was asymptomatic at that time. Magnesium 2.7.    Prolonged QTc (484 ms) Offending medications include pepcid, lasix, and zofran. Zofran and pepcid discontinued and decreased lasix dose per cardiology. Continue to monitor and avoid prolonging agents.   Essential hypertension Continue Lasix and metoprolol. Hydralazine ordered as needed.    Hyperlipidemia Continue Zetia and Crestor.    CAD (coronary artery disease) Continue aspirin/Plavix, metoprolol, statin. Nitroglycerin ordered as needed.    DM type 2 with diabetic peripheral neuropathy (HCC) Currently being managed with Lantus 30 units twice a day and sensitive scale SSI. At at bedtime coverage per diabetes coordinator recommendations. CBGs suboptimally controlled: 199-304. We'll increase Lantus to 35 units and change SSI to resistant scale.    OSA on CPAP Continue CPAP.    GERD (gastroesophageal reflux disease) Continue Pepcid.    Depression Continue Wellbutrin.    Acute renal failure superimposed on stage 2 chronic kidney disease (HCC) Baseline creatinine is 1.3-1.4.  Current creatinine slightly above baseline.    Joint pain Suspect this is a manifestation from untreated hypothyroidism. Continue Neurontin.    Morbid obesity Body mass index is 49.77 kg/m. Dietician consult for weight loss counseling.   Family Communication/Anticipated D/C date and plan/Code Status   DVT prophylaxis: Lovenox ordered. Code Status: Full Code.  Family Communication: No family at the bedside. Disposition Plan: Home when stable.   Medical Consultants:    Cardiology   Anti-Infectives:    None  Subjective:   Still short of breath.  Felt weak when ambulating. No chest pain. No nausea or vomiting. Last BM was several days ago but reluctant to take laxatives.  Objective:    Vitals:   05/14/17 0818 05/14/17 0821 05/14/17 1953 05/15/17 0554  BP: (!) 78/40 (!) 90/52 102/63 (!) 90/50  Pulse:   68 (!) 57  Resp:   18 18  Temp:   98 F (36.7 C) 98.2 F (36.8 C)  TempSrc:   Oral Oral  SpO2:   99% 98%  Weight:    135.7 kg (299 lb 1.6 oz)  Height:        Intake/Output Summary (Last 24 hours) at 05/15/2017 0757 Last data filed at 05/15/2017 0145 Gross per 24 hour  Intake 1412  ml  Output 2600 ml  Net -1188 ml   Filed Weights   05/13/17 2305 05/14/17 0605 05/15/17 0554  Weight: 134.9 kg (297 lb 6.4 oz) 134.9 kg (297 lb 4.8 oz) 135.7 kg (299 lb 1.6 oz)    Exam: General: Morbidly obese. Diffusely swollen/anasarca. Sitting up in chair. Cardiovascular: Heart sounds show a regular rate, and rhythm. Distant heart tones. No gallops or rubs. No murmurs. No JVD. Lungs: Decreased breath sounds but no rales. Mildly labored breathing. Abdomen: Soft/obese, nontender, nondistended with normal active bowel sounds. No masses. No hepatosplenomegaly. Neurological: Alert and oriented 3. Moves all extremities 4 with equal strength. Cranial nerves II through XII grossly intact. Skin: Warm and dry. No rashes or lesions. Leathery appearance. Extremities: No clubbing or cyanosis. 3+ edema. Pedal pulses diminished. Psychiatric: Mood and affect are normal. Insight and judgment are poor.  Data Reviewed:   I have personally reviewed following labs and imaging studies:  Labs: Labs show the following:   Basic Metabolic Panel: Recent Labs  Lab 05/13/17 1547 05/14/17 0230 05/14/17 0914 05/15/17 0629  NA 137 135  --  136  K 3.9 3.8  --  3.3*  CL 97* 97*  --  97*  CO2 32 26  --  27  GLUCOSE 232* 256*  --  205*  BUN 26* 29*  --  33*  CREATININE 1.82* 1.89*  --  1.75*  CALCIUM 9.4 9.1  --  8.5*  MG  --   --  2.7*  --    GFR Estimated Creatinine Clearance: 67.3 mL/min (A) (by C-G formula based on SCr of 1.75 mg/dL (H)). Liver Function Tests: Recent Labs  Lab 05/13/17 2141  AST 158*  ALT 132*  ALKPHOS 68  BILITOT 0.3  PROT 7.4  ALBUMIN 4.0   Coagulation profile Recent Labs  Lab 05/13/17 2130  INR 0.97    CBC: Recent Labs  Lab 05/13/17 1547  WBC 12.6*  HGB 11.9*  HCT 36.0*  MCV 95.0  PLT 211   Cardiac Enzymes: Recent Labs  Lab 05/13/17 2130 05/14/17 0230 05/14/17 0914  TROPONINI <0.03 <0.03 <0.03   BNP (last 3 results) Recent Labs     05/06/17 1509  PROBNP 95   CBG: Recent Labs  Lab 05/14/17 0742 05/14/17 1149 05/14/17 1554 05/14/17 2111 05/15/17 0737  GLUCAP 242* 304* 250* 274* 199*   Thyroid function studies: Recent Labs    05/13/17 2131 05/13/17 2141  TSH  --  129.821*  T3FREE 1.0*  --    Sepsis Labs: Recent Labs  Lab 05/13/17 1547 05/13/17 2130 05/13/17 2131 05/14/17 0045 05/14/17 0230  PROCALCITON  --  <0.10  --   --   --   WBC 12.6*  --   --   --   --   LATICACIDVEN  --   --  1.5 2.4* 1.2    Microbiology Recent Results (from the past 240 hour(s))  Culture, blood (x 2)     Status: None (Preliminary result)   Collection Time: 05/13/17  9:55 PM  Result Value Ref Range Status   Specimen Description BLOOD LEFT ANTECUBITAL  Final   Special Requests   Final    BOTTLES  DRAWN AEROBIC AND ANAEROBIC Blood Culture adequate volume   Culture NO GROWTH < 12 HOURS  Final   Report Status PENDING  Incomplete  Culture, blood (x 2)     Status: None (Preliminary result)   Collection Time: 05/13/17 10:53 PM  Result Value Ref Range Status   Specimen Description BLOOD RIGHT ANTECUBITAL  Final   Special Requests IN PEDIATRIC BOTTLE Blood Culture adequate volume  Final   Culture NO GROWTH < 12 HOURS  Final   Report Status PENDING  Incomplete    Procedures and diagnostic studies:  05/13/17: Dg Chest 2 View: My independent review of the image shows: Cardiomegaly. No pneumonia or obvious CHF. No pleural effusions.    Medications:   . aspirin  81 mg Oral Daily  . buPROPion  150 mg Oral BID  . clopidogrel  75 mg Oral Daily  . enoxaparin (LOVENOX) injection  70 mg Subcutaneous Q24H  . ezetimibe  10 mg Oral Daily  . furosemide  40 mg Intravenous BID  . gabapentin  900 mg Oral TID  . insulin aspart  0-5 Units Subcutaneous QHS  . insulin aspart  0-9 Units Subcutaneous TID WC  . insulin glargine  30 Units Subcutaneous BID  . levothyroxine  50 mcg Oral QAC breakfast  . metoprolol tartrate  50 mg Oral BID   . polyethylene glycol  17 g Oral Daily  . rosuvastatin  40 mg Oral Daily  . sodium chloride flush  3 mL Intravenous Q12H   Continuous Infusions: . sodium chloride    . magnesium sulfate 1 - 4 g bolus IVPB       LOS: 2 days   Hillery AldoChristina Rama  [facility] Pager [phone]. If unable to reach me by pager, please call my cell phone at [phone].  *Please refer to [email or web address], password [email or web address] to get updated schedule on who will round on this patient, as hospitalists switch teams weekly. If 7PM-7AM, please contact night-coverage at [email or web address], password [email or web address] for any overnight needs.  05/15/2017, 7:57 AM

## 2017-05-15 NOTE — Progress Notes (Signed)
Per pt request, this RN Amion paged Dr. Darnelle Catalanama to inform her that pt states that the Gabapentin 900mg  is ineffective.  Pt is requested orders for substitute for BUE and BLE neuropathy pain.  No orders received.

## 2017-05-15 NOTE — Progress Notes (Addendum)
Progress Note  Patient Name: Marc Walker Date of Encounter: 05/15/2017  Primary Cardiologist: Ross/Turner  Subjective   Pt denies chest pain. Denies SOB, states that walking limited by joint pain.   Inpatient Medications    Scheduled Meds: . aspirin  81 mg Oral Daily  . buPROPion  150 mg Oral BID  . clopidogrel  75 mg Oral Daily  . enoxaparin (LOVENOX) injection  70 mg Subcutaneous Q24H  . ezetimibe  10 mg Oral Daily  . furosemide  40 mg Intravenous BID  . gabapentin  900 mg Oral TID  . insulin aspart  0-20 Units Subcutaneous TID WC  . insulin aspart  0-5 Units Subcutaneous QHS  . insulin glargine  35 Units Subcutaneous BID  . levothyroxine  50 mcg Oral QAC breakfast  . metoprolol tartrate  50 mg Oral BID  . potassium chloride  20 mEq Oral BID  . rosuvastatin  40 mg Oral Daily  . sodium chloride flush  3 mL Intravenous Q12H   Continuous Infusions: . sodium chloride    . magnesium sulfate 1 - 4 g bolus IVPB     PRN Meds: sodium chloride, acetaminophen, hydrALAZINE, nitroGLYCERIN, polyethylene glycol, sodium chloride flush, zolpidem   Vital Signs    Vitals:   05/14/17 0818 05/14/17 0821 05/14/17 1953 05/15/17 0554  BP: (!) 78/40 (!) 90/52 102/63 (!) 90/50  Pulse:   68 (!) 57  Resp:   18 18  Temp:   98 F (36.7 C) 98.2 F (36.8 C)  TempSrc:   Oral Oral  SpO2:   99% 98%  Weight:    299 lb 1.6 oz (135.7 kg)  Height:        Intake/Output Summary (Last 24 hours) at 05/15/2017 1146 Last data filed at 05/15/2017 1000 Gross per 24 hour  Intake 1692 ml  Output 2250 ml  Net -558 ml   Filed Weights   05/13/17 2305 05/14/17 0605 05/15/17 0554  Weight: 297 lb 6.4 oz (134.9 kg) 297 lb 4.8 oz (134.9 kg) 299 lb 1.6 oz (135.7 kg)     Physical Exam   General: morbidly obese, male appearing in no acute distress with total body anasarca Head: Normocephalic, atraumatic.  Neck: Supple without bruits, no JVD, exam difficult Lungs:  Resp regular and unlabored, CTA  but diminished throughout Heart: RRR, S1, S2, no murmur; no rub. Abdomen: Soft, non-tender, non-distended with normoactive bowel sounds. No hepatomegaly. No rebound/guarding. No obvious abdominal masses. Extremities: No clubbing, cyanosis, trace LE edema. Distal pedal pulses are 1+ bilaterally. Neuro: Alert and oriented X 3. Moves all extremities spontaneously. Psych: Normal affect.  Labs    Chemistry Recent Labs  Lab 05/13/17 1547 05/13/17 2141 05/14/17 0230 05/15/17 0629  NA 137  --  135 136  K 3.9  --  3.8 3.3*  CL 97*  --  97* 97*  CO2 32  --  26 27  GLUCOSE 232*  --  256* 205*  BUN 26*  --  29* 33*  CREATININE 1.82*  --  1.89* 1.75*  CALCIUM 9.4  --  9.1 8.5*  PROT  --  7.4  --   --   ALBUMIN  --  4.0  --   --   AST  --  158*  --   --   ALT  --  132*  --   --   ALKPHOS  --  68  --   --   BILITOT  --  0.3  --   --  GFRNONAA 43*  --  41* 45*  GFRAA 49*  --  47* 52*  ANIONGAP 8  --  12 12     Hematology Recent Labs  Lab 05/13/17 1547  WBC 12.6*  RBC 3.79*  HGB 11.9*  HCT 36.0*  MCV 95.0  MCH 31.4  MCHC 33.1  RDW 13.8  PLT 211    Cardiac Enzymes Recent Labs  Lab 05/13/17 2130 05/14/17 0230 05/14/17 0914  TROPONINI <0.03 <0.03 <0.03    Recent Labs  Lab 05/13/17 1556  TROPIPOC 0.01     BNP Recent Labs  Lab 05/13/17 2141  BNP 24.2     DDimer No results for input(s): DDIMER in the last 168 hours.   Radiology    Dg Chest 2 View  Result Date: 05/13/2017 CLINICAL DATA:  Acute shortness of breath for 3 weeks. Fluid overload. EXAM: CHEST  2 VIEW COMPARISON:  04/25/2017 and prior chest radiographs FINDINGS: Upper limits normal heart size and mild chronic peribronchial thickening again noted. There is no evidence of focal airspace disease, pulmonary edema, suspicious pulmonary nodule/mass, pleural effusion, or pneumothorax. No acute bony abnormalities are identified. IMPRESSION: 1. No evidence of acute cardiopulmonary disease. 2. Mild chronic  peribronchial thickening. Electronically Signed   By: Harmon Pier M.D.   On: 05/13/2017 16:10    Telemetry    Sinus rhythm, three bouts of wide complex tachycardia - Personally Reviewed  ECG    Sinus, flattened T waves, prolonged QTc - Personally Reviewed   Cardiac Studies   Echo 05/14/17: pending  Echo 07/10/16: Study Conclusions - Left ventricle: The cavity size was normal. Wall thickness was   increased in a pattern of mild LVH. Systolic function was normal.   The estimated ejection fraction was in the range of 60% to 65%.   Features are consistent with a pseudonormal left ventricular   filling pattern, with concomitant abnormal relaxation and   increased filling pressure (grade 2 diastolic dysfunction). - Left atrium: The atrium was mildly dilated. - Impressions: Technically limited study due to poor sound wave   transmission. No obvious wall motion abnormalities with Definity   contrast imaging.  Impressions: - Technically limited study due to poor sound wave transmission. No   obvious wall motion abnormalities with Definity contrast imaging.   Myoview 03/26/16:  Nuclear stress EF: 55%.  There was no ST segment deviation noted during stress.  Defect 1: There is a medium defect of mild severity present in the basal inferolateral, mid inferolateral and apical inferior location.  Findings consistent with ischemia.  This is a low risk study.   Low risk stress nuclear study with mild ischemia in the distribution of a distal OM or PLA branch. Normal left ventricular regional and global systolic function.   Left heart cath 09/16/15: s/p successful proximal LAD PCI and drug-eluting stenting as well as mid RCA along with Angiosculpt balloon angioplasty of the proximal segmental PDA "in-stent restenosis. Patent Ost LAD to Prox LAD stent (unknown type). Patent Ost 2nd Mrg to 2nd Mrg stent (unknown type). 20% in-stenosed Mid RCA to Dist RCA lesion (unknown type). 20%  in-stenosed Ost RPDA to RPDA lesion (unknown type). Mid Cx lesion, 70% stenosed.  Patient Profile     46 y.o. male with history of CAD s/pprior myocardial infarction, s/p multipleprior stents placedat outside institutions with last intervention 08/2015 at [facility], DM, HTN, HL, tobacco abuse, OSA, chronic diastolic HF, morbid obesity who presented to office for f/u and was admitted to [facility] for further  treatment.   Assessment & Plan    1. Acute on chronic diastolic heart failure, total body anasarca - pt was admitted and started on IV diuresis - 80 mg IV BID - home regimen includes lopressor, ASA, plavix.and statin - pt weight is up to 299 lbs - previous pBNP was normal on 05/06/17 - BNP normal this admission - I would increase his lasix back to 40 mg iv Q6H as his weight up after decreasing lasix  - crea continues to improve   2. Prolonged QTc (484 ms) - offending medications include pepcid, lasix, and zofran. I D/C'ed zofran and pepcid for now and decreased lasix - continue to monitor and avoid prolonging agents   3. Wide complex tachycardia - appreciated on telemetry - pt was asymptomatic - ordered magnesium - 12-lead pending   4. AKI - sCr 1.89 (1.82), baseline  - normal creatinine 06/2016 - 09/2016 - was 1.36 on 04/25/17 - now increasing on this admission 1.82 --> 1.89 - consider renal consult   5. Elevated TSH - 129.821 - diagnosed hypothyroidism 7-8 years ago, not compliant on synthroid secondary to cost - consider endocrinology follow up   This is a complex patient with total body fluid overload that is not necessarily classic for congestive heart failure exacerbation. Given his declining kidney function, elevated TSH, and body arthralgias, other systemic causes should be investigated. He has not taken synthroid for his previously diagnosed hypothyroidism in several years. Primary team restarting synthroid.   Tobias AlexanderKatarina Paschal Blanton, MD 05/15/2017

## 2017-05-15 NOTE — Plan of Care (Signed)
  Education: Knowledge of General Education information will improve 05/15/2017 0746 - Completed/Met by Evert Kohl, RN   Health Behavior/Discharge Planning: Ability to manage health-related needs will improve 05/15/2017 0746 - Completed/Met by Evert Kohl, RN   Clinical Measurements: Ability to maintain clinical measurements within normal limits will improve 05/15/2017 0746 - Completed/Met by Evert Kohl, RN   Clinical Measurements: Will remain free from infection 05/15/2017 0746 - Completed/Met by Evert Kohl, RN   Clinical Measurements: Diagnostic test results will improve 05/15/2017 0746 - Completed/Met by Evert Kohl, RN   Clinical Measurements: Respiratory complications will improve 05/15/2017 0746 - Completed/Met by Evert Kohl, RN   Clinical Measurements: Cardiovascular complication will be avoided 05/15/2017 0746 - Completed/Met by Evert Kohl, RN

## 2017-05-16 ENCOUNTER — Encounter (HOSPITAL_COMMUNITY): Payer: Self-pay | Admitting: Internal Medicine

## 2017-05-16 DIAGNOSIS — E7800 Pure hypercholesterolemia, unspecified: Secondary | ICD-10-CM

## 2017-05-16 DIAGNOSIS — I959 Hypotension, unspecified: Secondary | ICD-10-CM

## 2017-05-16 DIAGNOSIS — G4733 Obstructive sleep apnea (adult) (pediatric): Secondary | ICD-10-CM

## 2017-05-16 DIAGNOSIS — I2510 Atherosclerotic heart disease of native coronary artery without angina pectoris: Secondary | ICD-10-CM

## 2017-05-16 DIAGNOSIS — K219 Gastro-esophageal reflux disease without esophagitis: Secondary | ICD-10-CM

## 2017-05-16 DIAGNOSIS — I472 Ventricular tachycardia: Secondary | ICD-10-CM

## 2017-05-16 DIAGNOSIS — N179 Acute kidney failure, unspecified: Secondary | ICD-10-CM

## 2017-05-16 DIAGNOSIS — N182 Chronic kidney disease, stage 2 (mild): Secondary | ICD-10-CM

## 2017-05-16 DIAGNOSIS — Z9989 Dependence on other enabling machines and devices: Secondary | ICD-10-CM

## 2017-05-16 DIAGNOSIS — I5033 Acute on chronic diastolic (congestive) heart failure: Secondary | ICD-10-CM

## 2017-05-16 DIAGNOSIS — R9431 Abnormal electrocardiogram [ECG] [EKG]: Secondary | ICD-10-CM

## 2017-05-16 DIAGNOSIS — J9601 Acute respiratory failure with hypoxia: Secondary | ICD-10-CM

## 2017-05-16 DIAGNOSIS — I10 Essential (primary) hypertension: Secondary | ICD-10-CM

## 2017-05-16 DIAGNOSIS — E039 Hypothyroidism, unspecified: Secondary | ICD-10-CM

## 2017-05-16 DIAGNOSIS — E1142 Type 2 diabetes mellitus with diabetic polyneuropathy: Secondary | ICD-10-CM

## 2017-05-16 LAB — GLUCOSE, CAPILLARY
GLUCOSE-CAPILLARY: 163 mg/dL — AB (ref 65–99)
GLUCOSE-CAPILLARY: 166 mg/dL — AB (ref 65–99)

## 2017-05-16 LAB — BASIC METABOLIC PANEL
ANION GAP: 7 (ref 5–15)
BUN: 28 mg/dL — ABNORMAL HIGH (ref 6–20)
CO2: 28 mmol/L (ref 22–32)
Calcium: 8.2 mg/dL — ABNORMAL LOW (ref 8.9–10.3)
Chloride: 101 mmol/L (ref 101–111)
Creatinine, Ser: 1.47 mg/dL — ABNORMAL HIGH (ref 0.61–1.24)
GFR calc Af Amer: >60 mL/min (ref >60)
GFR, EST NON AFRICAN AMERICAN: 55 mL/min — AB (ref >60)
Glucose, Bld: 141 mg/dL — ABNORMAL HIGH (ref 65–99)
POTASSIUM: 3.3 mmol/L — AB (ref 3.5–5.1)
SODIUM: 136 mmol/L (ref 135–145)

## 2017-05-16 LAB — TROPONIN I: Troponin I: <0.03 ng/mL (ref <0.03)

## 2017-05-16 MED ORDER — LEVOTHYROXINE SODIUM 50 MCG PO TABS: 50.0000 ug | ORAL_TABLET | Freq: Every day | ORAL | 1 refills | Status: DC

## 2017-05-16 MED ORDER — INSULIN ASPART 100 UNIT/ML ~~LOC~~ SOLN
4.0000 [IU] | Freq: Three times a day (TID) | SUBCUTANEOUS | Status: DC
  Administered 2017-05-16: 4 [IU] via SUBCUTANEOUS

## 2017-05-16 MED ORDER — TRAMADOL HCL 50 MG PO TABS: 50.0000 mg | ORAL_TABLET | Freq: Four times a day (QID) | ORAL | 0 refills | Status: DC | PRN

## 2017-05-16 MED ORDER — POTASSIUM CHLORIDE ER 20 MEQ PO TBCR: 40.0000 meq | EXTENDED_RELEASE_TABLET | Freq: Two times a day (BID) | ORAL | 2 refills | Status: DC

## 2017-05-16 NOTE — Care Management Note (Signed)
Case Management Note  Patient Details  Name: Marc Walker MRN: [id number] Date of Birth: Sep 26, 1970  Subjective/Objective:                 Spoke w patient at the bedside. He declines 3/1. [facility]. [facility] will assess for RN care, [facility]. RW to be delivered to room prior to DC. D[facility]Brad, [facility] that patient recently has a sleep study and will also need follow up to obtain his CPAP through charity if possible. CM signing off   Action/Plan:   Expected Discharge Date:  05/16/17               Expected Discharge Plan:  Home/Self Care  In-House Referral:     Discharge planning Services  CM Consult  Post Acute Care Choice:  Durable Medical Equipment, Home Health Choice offered to:  Patient  DME Arranged:  Walker rolling DME Agency:  [facility]  [facility]  [facility]  Status of Service:  Completed, signed off  If discussed at Long Length of Stay Meetings, dates discussed:    Additional Comments:  Lawerance SabalDebbie Laurelle Skiver, RN 05/16/2017, 10:57 AM

## 2017-05-16 NOTE — Plan of Care (Signed)
  Completed/Met Cardiac: Ability to achieve and maintain adequate cardiopulmonary perfusion will improve 05/16/2017 0835 - Completed/Met by Evert Kohl, RN

## 2017-05-16 NOTE — Discharge Summary (Signed)
Physician Discharge Summary  Marc Walker WUJ:[id number] DOB: 08-21-1970 DOA: 05/13/2017  PCP: Marcine Matar, MD  Admit date: 05/13/2017 Discharge date: 05/16/2017  Admitted From: Home Discharge disposition: Home   Recommendations for Outpatient Follow-Up:   1. Needs repeat TSH/free T4 in 4 weeks with adjustment of Synthroid dose to achieve appropriate replacement. 2. Repeat BMET in 1 week.   Discharge Diagnosis:   Principal Problem:   Acute on chronic diastolic CHF (congestive heart failure) (HCC) Active Problems:   Essential hypertension   Hyperlipidemia   CAD (coronary artery disease)   DM type 2 with diabetic peripheral neuropathy (HCC)   OSA on CPAP   SIRS (systemic inflammatory response syndrome) (HCC)   Acute respiratory failure with hypoxia (HCC)   GERD (gastroesophageal reflux disease)   Depression   Acute renal failure superimposed on stage 2 chronic kidney disease (HCC)   Hypotension   Joint pain   Hypothyroidism   Prolonged QT interval   Wide-complex tachycardia (HCC)  Discharge Condition: Improved.  Diet recommendation: Low sodium, heart healthy.  Carbohydrate-modified.    History of Present Illness:   Marc Walker is an 46 y.o. male with a PMH of chronic diastolic CHF, hypertension, hyperlipidemia, diabetes, GERD, depression, OSA on CPAP, morbid obesity, CAD/MI status post stent, stage II on a kidney disease, hypothyroidism, and history of tobacco abuse who was admitted 05/13/17 for evaluation of worsening shortness of breath associated with bilateral leg edema, weight gain and joint pain. In the ED, chest x-ray showed chronic peribronchial thickening.  Hospital Course by Problem:   Principal Problems:   Acute on chronic diastolic CHF (congestive heart failure) (HCC)/anasarca/acute respiratory failure with hypoxia Dry weight noted to be 293 pounds. Current weight 299 pounds.  Anasarca felt to be from a combination of hypothyroidism and  diastolic CHF.  Discharge home on 80 mg of Lasix twice a day. We'll set up home health nurse for home education.    Severe hypothyroidism Has not taken any thyroid medication for 7-8 years. Suspect this is contributing to his shortness of breath. Hypoventilation can result from respiratory muscle weakness and hypothyroidism can be associated with reduced pulmonary response to hypoxia/hypercapnia. Hypothyroidism can also worsen sleep apnea, which he already has. Continue Synthroid. Will need close outpatient follow-up.  Active Problems:   Hypokalemia Discharge home on potassium 40 mEq twice a day. Needs close follow-up to recheck electrolytes and kidney function.    Wide complex tachycardia Was appreciated on telemetry, patient was asymptomatic at that time. Magnesium 2.7.    Prolonged QTc (484 ms) Offending medications include pepcid, lasix, and zofran. Zofran and pepcid discontinued. Try to avoid prolonging agents.    Essential hypertension Blood pressure well controlled. Continue Lasix and metoprolol.     Hyperlipidemia Continue Zetia and Crestor.    CAD (coronary artery disease) Continue aspirin/Plavix, metoprolol, statin. Nitroglycerin ordered as needed.    DM type 2 with diabetic peripheral neuropathy (HCC) Needs close follow-up of glycemic control.    OSA on CPAP Continue CPAP. Home social worker to assess CPAP machine and to explore options for replacement.    GERD (gastroesophageal reflux disease) Continue Pepcid.    Depression Continue Wellbutrin.    Acute renal failure superimposed on stage 2 chronic kidney disease (HCC) Baseline creatinine is 1.3-1.4.  Current creatinine back to baseline values.    Joint pain Suspect this is a manifestation from untreated hypothyroidism. Continue Neurontin. Discharged with low-dose Ultram and instructions to break the pills in half, use sparingly, and only  if absolutely needed.    Morbid obesity Body mass index is  49.79 kg/m. Dietician provided weight loss counseling.  Medical Consultants:    Cardiology   Discharge Exam:   Vitals:   05/16/17 1003 05/16/17 1217  BP: 115/72 (!) 99/54  Pulse: 74 89  Resp:  18  Temp:  98.3 F (36.8 C)  SpO2: 98% 93%   Vitals:   05/15/17 2325 05/16/17 0540 05/16/17 1003 05/16/17 1217  BP: 97/67 (!) 100/55 115/72 (!) 99/54  Pulse: 73 67 74 89  Resp: 20 18  18   Temp:  98 F (36.7 C)  98.3 F (36.8 C)  TempSrc:  Oral  Oral  SpO2: 99% 99% 98% 93%  Weight:  135.7 kg (299 lb 3.2 oz)    Height:        General exam: Appears calm and comfortable. + Anasarca. Respiratory system: Diminished breath sounds. Respiratory effort normal. Cardiovascular system: S1 & S2 heard, RRR. No JVD,  rubs, gallops or clicks. No murmurs. Gastrointestinal system: Abdomen is diffusely distended with anasarca, soft and nontender. No organomegaly or masses felt. Normal bowel sounds heard. Central nervous system: Alert and oriented. No focal neurological deficits. Extremities: No clubbing,  or cyanosis. Tense edema. Skin: No rashes, lesions or ulcers. Dry skin. Psychiatry: Judgement and insight appear impaired. Mood & affect depressed/flat.   The results of significant diagnostics from this hospitalization (including imaging, microbiology, ancillary and laboratory) are listed below for reference.     Procedures and Diagnostic Studies:   Dg Chest 2 View  Result Date: 05/13/2017 CLINICAL DATA:  Acute shortness of breath for 3 weeks. Fluid overload. EXAM: CHEST  2 VIEW COMPARISON:  04/25/2017 and prior chest radiographs FINDINGS: Upper limits normal heart size and mild chronic peribronchial thickening again noted. There is no evidence of focal airspace disease, pulmonary edema, suspicious pulmonary nodule/mass, pleural effusion, or pneumothorax. No acute bony abnormalities are identified. IMPRESSION: 1. No evidence of acute cardiopulmonary disease. 2. Mild chronic peribronchial  thickening. Electronically Signed   By: Harmon PierJeffrey  Hu M.D.   On: 05/13/2017 16:10   2-D echo 05/14/17  Study Conclusions  - Left ventricle: The cavity size was normal. There was mild   concentric hypertrophy. Systolic function was normal. The   estimated ejection fraction was in the range of 60% to 65%.   Although no diagnostic regional wall motion abnormality was   identified, this possibility cannot be completely excluded on the   basis of this study. Features are consistent with a pseudonormal   left ventricular filling pattern, with concomitant abnormal   relaxation and increased filling pressure (grade 2 diastolic   dysfunction).   Labs:   Basic Metabolic Panel: Recent Labs  Lab 05/13/17 1547 05/14/17 0230 05/14/17 0914 05/15/17 0629 05/16/17 0536  NA 137 135  --  136 136  K 3.9 3.8  --  3.3* 3.3*  CL 97* 97*  --  97* 101  CO2 32 26  --  27 28  GLUCOSE 232* 256*  --  205* 141*  BUN 26* 29*  --  33* 28*  CREATININE 1.82* 1.89*  --  1.75* 1.47*  CALCIUM 9.4 9.1  --  8.5* 8.2*  MG  --   --  2.7*  --   --    GFR Estimated Creatinine Clearance: 80.1 mL/min (A) (by C-G formula based on SCr of 1.47 mg/dL (H)). Liver Function Tests: Recent Labs  Lab 05/13/17 2141  AST 158*  ALT 132*  ALKPHOS 68  BILITOT 0.3  PROT 7.4  ALBUMIN 4.0   Coagulation profile Recent Labs  Lab 05/13/17 2130  INR 0.97    CBC: Recent Labs  Lab 05/13/17 1547  WBC 12.6*  HGB 11.9*  HCT 36.0*  MCV 95.0  PLT 211   Cardiac Enzymes: Recent Labs  Lab 05/13/17 2130 05/14/17 0230 05/14/17 0914 05/15/17 1719  CKTOTAL  --   --   --  619*  TROPONINI <0.03 <0.03 <0.03  --    CBG: Recent Labs  Lab 05/15/17 1143 05/15/17 1602 05/15/17 2121 05/16/17 0720 05/16/17 1146  GLUCAP 195* 208* 178* 163* 166*   Thyroid function studies Recent Labs    05/13/17 2131 05/13/17 2141  TSH  --  129.821*  T3FREE 1.0*  --     Microbiology Recent Results (from the past 240 hour(s))    Culture, blood (x 2)     Status: None (Preliminary result)   Collection Time: 05/13/17  9:55 PM  Result Value Ref Range Status   Specimen Description BLOOD LEFT ANTECUBITAL  Final   Special Requests   Final    BOTTLES DRAWN AEROBIC AND ANAEROBIC Blood Culture adequate volume   Culture NO GROWTH 3 DAYS  Final   Report Status PENDING  Incomplete  Culture, blood (x 2)     Status: None (Preliminary result)   Collection Time: 05/13/17 10:53 PM  Result Value Ref Range Status   Specimen Description BLOOD RIGHT ANTECUBITAL  Final   Special Requests IN PEDIATRIC BOTTLE Blood Culture adequate volume  Final   Culture NO GROWTH 3 DAYS  Final   Report Status PENDING  Incomplete     Discharge Instructions:   Discharge Instructions    (HEART FAILURE PATIENTS) Call MD:  Anytime you have any of the following symptoms: 1) 3 pound weight gain in 24 hours or 5 pounds in 1 week 2) shortness of breath, with or without a dry hacking cough 3) swelling in the hands, feet or stomach 4) if you have to sleep on extra pillows at night in order to breathe.   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  Pt only wants pain medicine if absolutely necessary.   Levofloxacin Rash   Rash on back (not sun exposed)and hypersensitivity to skin on exposed skin/arm      Medication List    STOP taking these medications   amLODipine 5 MG tablet Commonly known as:  NORVASC   ibuprofen 800 MG tablet Commonly known as:  ADVIL,MOTRIN   lisinopril 10 MG tablet Commonly known as:   PRINIVIL,ZESTRIL     TAKE these medications   aspirin 81 MG tablet Take 1 tablet (81 mg total) by mouth daily.   buPROPion 150 MG 12 hr tablet Commonly known as:  WELLBUTRIN SR Take 1 tablet (150 mg total) by mouth 2 (two)  times daily.   clopidogrel 75 MG tablet Commonly known as:  PLAVIX Take 1 tablet (75 mg total) by mouth daily.   empagliflozin 10 MG Tabs tablet Commonly known as:  JARDIANCE Take 10 mg by mouth daily.   ezetimibe 10 MG tablet Commonly known as:  ZETIA Take 1 tablet (10 mg total) by mouth daily.   famotidine 20 MG tablet Commonly known as:  PEPCID Take 1 tablet (20 mg total) by mouth 2 (two) times daily.   furosemide 80 MG tablet Commonly known as:  LASIX Take 1 tablet (80 mg total) 2 (two) times daily by mouth.   gabapentin 300 MG capsule Commonly known as:  NEURONTIN Take 3 capsules (900 mg total) by mouth 3 (three) times daily.   Insulin Glargine 100 UNIT/ML Solostar Pen Commonly known as:  LANTUS SOLOSTAR Inject 45 Units into the skin 2 (two) times daily.   insulin lispro 100 UNIT/ML injection Commonly known as:  HUMALOG Inject 0.2 mLs (20 Units total) into the skin 3 (three) times daily with meals.   levothyroxine 50 MCG tablet Commonly known as:  SYNTHROID, LEVOTHROID Take 1 tablet (50 mcg total) daily before breakfast by mouth. Start taking on:  05/17/2017   metFORMIN 500 MG 24 hr tablet Commonly known as:  GLUCOPHAGE-XR TAKE 2 TABLETS BY MOUTH 2 TIMES DAILY WITH A MEAL. What changed:    how much to take  how to take this  when to take this  additional instructions   metoprolol tartrate 50 MG tablet Commonly known as:  LOPRESSOR Take 1 tablet (50 mg total) by mouth 2 (two) times daily.   nitroGLYCERIN 0.4 MG SL tablet Commonly known as:  NITROSTAT Place 1 tablet (0.4 mg total) under the tongue every 5 (five) minutes as needed for chest pain.   polyethylene glycol packet Commonly known as:  MIRALAX Take 17 g by mouth  daily.   Potassium Chloride ER 20 MEQ Tbcr Take 40 mEq 2 (two) times daily by mouth. What changed:  when to take this   rosuvastatin 40 MG tablet Commonly known as:  CRESTOR Take 1 tablet (40 mg total) by mouth daily.   traMADol 50 MG tablet Commonly known as:  ULTRAM Take 1 tablet (50 mg total) every 6 (six) hours as needed by mouth.            Durable Medical Equipment  (From admission, onward)        Start     Ordered   05/16/17 0956  DME 3-in-1  Once     05/16/17 0957   05/16/17 0956  For home use only DME Walker rolling  [facility](Walkers)  Once    Question:  Patient needs a walker to treat with the following condition  Answer:  Unsteady gait   05/16/17 0957     Follow-up Information    [name], [name], MD. Schedule an appointment as soon as possible for a visit in 1 week(s).   Specialty:  Internal Medicine Why:  Hospital follow up. Contact information: [address]  [place] [address] [phone]        [facility] - Dme Follow up.   Why:  For RW, and home health services if qualifies through charity care Contact information: [address] [place] [address] [phone]            Time coordinating discharge: 35 minutes.  SignedTrula Ore:  Christina Rama  Pager [phone] [facility] 05/16/2017, 1:43 PM

## 2017-05-17 ENCOUNTER — Telehealth: Payer: Self-pay

## 2017-05-17 MED FILL — ?FUROSEMIDE 40 MG TABLET: 40 | 35 days supply | Qty: 15 | Fill #2

## 2017-05-17 MED FILL — GABAPENTIN 300 MG CAPSULE: 300 | 20 days supply | Qty: 180 | Fill #1

## 2017-05-17 MED FILL — ROSUVASTATIN CALCIUM 40 MG: 40 | 30 days supply | Qty: 30 | Fill #5

## 2017-05-17 MED FILL — LEVOTHYROXINE 50 MCG TABLET: 50 | 30 days supply | Qty: 30 | Fill #0

## 2017-05-17 MED FILL — ?EZETIMIBE 10MG TAB: 10 | 30 days supply | Qty: 30 | Fill #0

## 2017-05-17 MED FILL — LISINOPRIL 10 MG TABS: 10 | 30 days supply | Qty: 60 | Fill #0

## 2017-05-17 MED FILL — HYDROCHLOROTHIAZIDE 25 MG T: 25 | 30 days supply | Qty: 30 | Fill #5

## 2017-05-17 MED FILL — ?METOPROLOL 50 MG TABLET: 50 | 30 days supply | Qty: 60 | Fill #5

## 2017-05-17 MED FILL — ?BUPROPION HCL SR 150MG TAB: 150 | 30 days supply | Qty: 60 | Fill #1

## 2017-05-17 MED FILL — ?CLOPIDOGREL 75MG TAB: 75 | 30 days supply | Qty: 30 | Fill #5

## 2017-05-17 MED FILL — METFORMIN HCL ER 500 MG TAB: 500 | 30 days supply | Qty: 120 | Fill #2

## 2017-05-17 NOTE — Telephone Encounter (Signed)
Correction to prior call with patient. He is taking humlog not novolog.

## 2017-05-17 NOTE — Telephone Encounter (Signed)
[facility] Post-discharge Follow-Up Phone Call:  Date of Discharge: 05/16/2017 Principal Discharge Diagnosis(es):  Acute on chronic diastolic CHF, HTN, CAD Post-discharge Communication: (Clearly document all attempts clearly and date contact made)  Call placed to the patient Call Completed: Yes                    With Whom: Patient Interpreter Needed: No     Please check all that apply:  X  Patient is knowledgeable of his/her condition(s) and/or treatment. ? Patient is caring for self at home.  X  Patient is receiving assist at home from family and/or caregiver. Family and/or caregiver is knowledgeable of patient's condition(s) and/or treatment. - has some support from his father who can drive him to his clinic appointment.  X  Patient is receiving home health services. If so, name of agency. - This CM [facility]  [facility] and spoke to [place] who confirmed that they have received a referral for home health RN but they have not yet contacted the patient.      Medication Reconciliation:  X  Medication list reviewed with patient. - reviewed in detail with the patient.  He has his discharge instructions and  was able to correctly state all medications/doses and frequency as well as what medications he has and what he needs.  He did note that he only takes the novolog as needed, not 3 times a day with meals as noted on his AVS. .  He also reported correctly that he is to stop taking amlodipine, lisinopril and advil.  X  Patient obtained all discharge medications. If not, why? - he has not obtained all medications. He stated that he has ordered everything that he is running out of  and [facility] after his appointment. He stated that he ordered the pepcid, gabapentin, synthroid, and tramadol. He had no questions about his medications.  He also noted that he has a glucometer and checks his blood sugars at least daily and logs results. He did not check it this  morning. He also has a scale and checks his weight daily. He reported that his weight this morning was 300.6 lbs and yesterday it was 299.2 lbs at the hospital. He explained that he has an app on his phone to track his medications, weight and A1C.   Activities of Daily Living:  ? Independent X  Needs assist (describe; ? home DME used) - uses a cane.  Has a walker at home as well as a shower chair and 3:1 commode.  ? [facility] (describe, ? home DME used)   Community resources in place for patient:  ? None  X  Home Health/Home DME - [facility] ? Assisted Living ? Support Group          Patient Education: he stated that he has a CPAP at home but is inquiring about getting a CPAP that has the capability to download information obtained on the machine when he sleeps.  He also needs humidification and as per his recent sleep study, the recommendation includes O2@ 2L via CPAP.  He does not have any insurance. Explained to him that the [employer] has refurbished machines that are available to a minimal donation but there is no guarantee of humidification with the machine.  If he wants to insure that the CPAP has humidification, he  have to pay out of pocket for a new CPAP machine from a DME company .  He then explained that his current CPAP, that he has been using for about 8 year, works fine. CM to need to [facility] with [facility] about the O2 for individuals who don't have insurance. He said that his prior CPAP setting was 14 but the recent sleep study recommended 18, so he watched a you tube tutorial to learn how to change the setting on his CPAP machine.         Questions/Concerns discussed:  He confirmed his appointment for tomorrow - -05/18/17 @ 0945 with the [facility] and also said that he has transportation to the clinic.

## 2017-05-18 ENCOUNTER — Ambulatory Visit: Payer: Self-pay | Attending: Family Medicine | Admitting: Family Medicine

## 2017-05-18 ENCOUNTER — Ambulatory Visit: Payer: Self-pay | Admitting: Internal Medicine

## 2017-05-18 ENCOUNTER — Telehealth: Payer: Self-pay

## 2017-05-18 ENCOUNTER — Encounter: Payer: Self-pay | Admitting: Family Medicine

## 2017-05-18 VITALS — BP 144/76 | HR 60 | Temp 97.6°F | Ht 65.0 in | Wt 301.8 lb

## 2017-05-18 DIAGNOSIS — R9431 Abnormal electrocardiogram [ECG] [EKG]: Secondary | ICD-10-CM

## 2017-05-18 DIAGNOSIS — I4581 Long QT syndrome: Secondary | ICD-10-CM | POA: Insufficient documentation

## 2017-05-18 DIAGNOSIS — I110 Hypertensive heart disease with heart failure: Secondary | ICD-10-CM | POA: Insufficient documentation

## 2017-05-18 DIAGNOSIS — Z79899 Other long term (current) drug therapy: Secondary | ICD-10-CM | POA: Insufficient documentation

## 2017-05-18 DIAGNOSIS — I252 Old myocardial infarction: Secondary | ICD-10-CM | POA: Insufficient documentation

## 2017-05-18 DIAGNOSIS — Z7902 Long term (current) use of antithrombotics/antiplatelets: Secondary | ICD-10-CM | POA: Insufficient documentation

## 2017-05-18 DIAGNOSIS — E785 Hyperlipidemia, unspecified: Secondary | ICD-10-CM | POA: Insufficient documentation

## 2017-05-18 DIAGNOSIS — E1142 Type 2 diabetes mellitus with diabetic polyneuropathy: Secondary | ICD-10-CM | POA: Insufficient documentation

## 2017-05-18 DIAGNOSIS — Z794 Long term (current) use of insulin: Secondary | ICD-10-CM | POA: Insufficient documentation

## 2017-05-18 DIAGNOSIS — Z6841 Body Mass Index (BMI) 40.0 and over, adult: Secondary | ICD-10-CM | POA: Insufficient documentation

## 2017-05-18 DIAGNOSIS — G4733 Obstructive sleep apnea (adult) (pediatric): Secondary | ICD-10-CM | POA: Insufficient documentation

## 2017-05-18 DIAGNOSIS — E876 Hypokalemia: Secondary | ICD-10-CM | POA: Insufficient documentation

## 2017-05-18 DIAGNOSIS — I10 Essential (primary) hypertension: Secondary | ICD-10-CM

## 2017-05-18 DIAGNOSIS — Z7982 Long term (current) use of aspirin: Secondary | ICD-10-CM | POA: Insufficient documentation

## 2017-05-18 DIAGNOSIS — I2510 Atherosclerotic heart disease of native coronary artery without angina pectoris: Secondary | ICD-10-CM | POA: Insufficient documentation

## 2017-05-18 DIAGNOSIS — E039 Hypothyroidism, unspecified: Secondary | ICD-10-CM | POA: Insufficient documentation

## 2017-05-18 DIAGNOSIS — I5043 Acute on chronic combined systolic (congestive) and diastolic (congestive) heart failure: Secondary | ICD-10-CM | POA: Insufficient documentation

## 2017-05-18 DIAGNOSIS — I5033 Acute on chronic diastolic (congestive) heart failure: Secondary | ICD-10-CM

## 2017-05-18 DIAGNOSIS — Z955 Presence of coronary angioplasty implant and graft: Secondary | ICD-10-CM | POA: Insufficient documentation

## 2017-05-18 LAB — CULTURE, BLOOD (ROUTINE X 2)
Culture: NO GROWTH
Culture: NO GROWTH
Special Requests: ADEQUATE
Special Requests: ADEQUATE

## 2017-05-18 LAB — GLUCOSE, POCT (MANUAL RESULT ENTRY): POC GLUCOSE: 215 mg/dL — AB (ref 70–99)

## 2017-05-18 NOTE — Telephone Encounter (Addendum)
Met with the patient when he was in the clinic today for his appointment with Dr Jarold Song. He noted that he received a call from [facility] and is now waiting for the nurse to call him to schedule a home visit. He also stated that he picked up his medications at [facility] today  He said that he forgot to discuss the status of his CPAP machine with Dr Jarold Song. He has no insurance and is not able to afford to pay for a new CPAP machine that would download information for pulmonary department. He explained again that he increased the pressure of the current  machine to 18cm H2O as ordered.  He also confirmed that the machine has a humidifier and is working well.  As per Dr Jarold Song, he does not qualify for O2 at this time.  This was explained to the patient by this CM. This CM then explained to him that without insurance if he qualified for O2 the cost for him would be approximately $150/month.  The patient stated that at this time, he will have to work with the CPAP that  he has.he does not have the money for pay for anything else.   Sleep study recommendations from sleep studies 05/04/17 and 05/05/17 were reviewed by Dr Jarold Song. She recommended no changes to the current CPAP as he has no insurance and noted that the patient will need to follow up with the sleep center in 10 weeks as recommended.

## 2017-05-18 NOTE — Progress Notes (Signed)
[place]  Date of Telephone Encounter: 05/17/17  Date of 1st service: 05/19/17   Admit Date: 05/13/17 Discharge Date: 05/16/17  PCP: Dr Karle Plumber    Subjective:  Patient ID: Marc Walker, male    DOB: May 08, 1971  Age: 46 y.o. MRN: [id number]  CC: Hospitalization Follow-up   HPI Marc Walker is a 46 year old male with a history of systolic heart failure (EF 21-97%, grade 2 diastolic dysfunction from 2D echo of 04/2017), hypertension, coronary artery disease, type 2 diabetes mellitus (A1c 7.0), obstructive sleep apnea (on CPAP) who presents to transitional care clinic after hospitalization for acute on chronic diastolic CHF.  He has been referred to the ED from his cardiology visit on 05/13/17 due to anasarca and weight gain.  On presentation to the ED he was dyspneic (o2 sat of 91-95%), his BNP was 24.8, tsh was 129.821, chest x-ray revealed mild peribronchial thickening, no evidence of acute cardiopulmonary disease. Anasarca was thought to be due to a combination of acute on chronic diastolic CHF and myxedema secondary to severe hypothyroidism (of note the patient had a history of hypothyroidism and has not been on medications in several years). He was treated with IV Lasix with improvement in weight; potassium was also supplemented; followed closely by cardiology during his stay. He was also noticed to have wide-complex tachycardia and a prolonged QTC of 484 ms.  Pepcid and Zofran was discontinued (thought to be offending agents). He was discharged on Lasix after his condition improved.  2D echo 05/14/17: Study Conclusions  - Left ventricle: The cavity size was normal. There was mild   concentric hypertrophy. Systolic function was normal. The   estimated ejection fraction was in the range of 60% to 65%.   Although no diagnostic regional wall motion abnormality was   identified, this possibility cannot be completely excluded on the   basis of this  study. Features are consistent with a pseudonormal   left ventricular filling pattern, with concomitant abnormal   relaxation and increased filling pressure (grade 2 diastolic   dysfunction).   He presents today accompanied by friend and is yet to pick up some of his medications.  He has been performing daily weights with his weight this morning at 295 however in the clinic it is 301 and he attributes this to his clothing. He denies shortness of breath, chest pains. He complains of poor quality of sleep despite using his CPAP machine and is wondering how he could obtain oxygen at night to use with his CPAP machine as recommended by his latest sleep study titration report; he increased the setting from 15 cm of water to 18 cm of water by looking it up on YouTube as per the most recent recommendation from 05/04/17.  Past Medical History:  Diagnosis Date  . Acute on chronic diastolic CHF (congestive heart failure) ([place]) 03/09/2017   Echo 1/18: Mild LVH, EF 60-65, Gr 2 DD, mild LAE; difficult study-no obvious wall motion abnormalities with Definity  . Chronic diastolic CHF (congestive heart failure) ([place]) 03/09/2017   Echo 1/18: Mild LVH, EF 60-65, Gr 2 DD, mild LAE; difficult study-no obvious wall motion abnormalities with Definity  . Coronary artery disease    a. s/pprior myocardial infarction, s/p multipleprior stents placedat outside institutions with last intervention 08/2015 at [facility].  . Diabetes mellitus ([place])   . Erectile dysfunction 03/02/2017  . Heartburn   . Hyperlipidemia associated with type 2 diabetes mellitus ([place])   . Hypertension   . MI (myocardial  infarction) [facility]) 07/2009   [facility] in [place], [place] for last 2, 1st one in [place], [place]; total of 6 stents, last one 02/2014   . Morbid obesity ([place]) 07/06/2016  . OSA (obstructive sleep apnea) 07/06/2016   Moderate with AHI 21.8/hr by PSG 08/2012 now on CPAP  . Prolonged QT interval 05/14/2017  . Thyroid disease    past  history  . Tobacco abuse   . Wide-complex tachycardia ([place]) 05/14/2017    Past Surgical History:  Procedure Laterality Date  . APPENDECTOMY    . CARDIAC CATHETERIZATION N/A 09/16/2015   Procedure: Left Heart Cath and Coronary Angiography;  Surgeon: Lorretta Harp, MD;  Location: [place] [facility];  Service: Cardiovascular;  Laterality: N/A;  . CARDIAC CATHETERIZATION N/A 09/16/2015   Procedure: Coronary Stent Intervention;  Surgeon: Lorretta Harp, MD;  Location: [place] [facility];  Service: Cardiovascular;  Laterality: N/A;  . CORONARY STENT PLACEMENT      Allergies  Allergen Reactions  . Nsaids     Avoid due to CKD  . 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  Pt only wants pain medicine if absolutely necessary.  . Levofloxacin Rash    Rash on back (not sun exposed)and hypersensitivity to skin on exposed skin/arm      Outpatient Medications Prior to Visit  Medication Sig Dispense Refill  . aspirin 81 MG tablet Take 1 tablet (81 mg total) by mouth daily. 90 tablet 3  . buPROPion (WELLBUTRIN SR) 150 MG 12 hr tablet Take 1 tablet (150 mg total) by mouth 2 (two) times daily. 60 tablet 2  . clopidogrel (PLAVIX) 75 MG tablet Take 1 tablet (75 mg total) by mouth daily. 30 tablet 10  . empagliflozin (JARDIANCE) 10 MG TABS tablet Take 10 mg by mouth daily. 90 tablet 3  . ezetimibe (ZETIA) 10 MG tablet Take 1 tablet (10 mg total) by mouth daily. 90 tablet 3  . famotidine (PEPCID) 20 MG tablet Take 1 tablet (20 mg total) by mouth 2 (two) times daily. 60 tablet 3  . furosemide (LASIX) 80 MG tablet Take 1 tablet (80 mg total) 2 (two) times daily by mouth. 180 tablet 3  . gabapentin (NEURONTIN) 300 MG capsule Take 3 capsules (900 mg total) by mouth 3 (three) times daily. 180 capsule 2  . Insulin Glargine (LANTUS SOLOSTAR) 100 UNIT/ML Solostar Pen Inject 45 Units into the skin 2 (two) times daily. 90 mL 3  . insulin lispro (HUMALOG) 100 UNIT/ML injection Inject 0.2  mLs (20 Units total) into the skin 3 (three) times daily with meals. 20 mL 2  . metFORMIN (GLUCOPHAGE-XR) 500 MG 24 hr tablet TAKE 2 TABLETS BY MOUTH 2 TIMES DAILY WITH A MEAL. (Patient taking differently: Take 1,000 mg 2 (two) times daily by mouth. TAKE 2 TABLETS BY MOUTH 2 TIMES DAILY WITH A MEAL.) 120 tablet 11  . nitroGLYCERIN (NITROSTAT) 0.4 MG SL tablet Place 1 tablet (0.4 mg total) under the tongue every 5 (five) minutes as needed for chest pain. 30 tablet 12  . polyethylene glycol (MIRALAX) packet Take 17 g by mouth daily. 14 each 0  . Potassium Chloride ER 20 MEQ TBCR Take 40 mEq 2 (two) times daily by mouth. 180 tablet 2  . levothyroxine (SYNTHROID, LEVOTHROID) 50 MCG tablet Take 1 tablet (50 mcg total) daily before breakfast by mouth. (Patient not taking: Reported on 05/18/2017) 30 tablet 1  . metoprolol tartrate (  LOPRESSOR) 50 MG tablet Take 1 tablet (50 mg total) by mouth 2 (two) times daily. 90 tablet 11  . rosuvastatin (CRESTOR) 40 MG tablet Take 1 tablet (40 mg total) by mouth daily. 90 tablet 3  . traMADol (ULTRAM) 50 MG tablet Take 1 tablet (50 mg total) every 6 (six) hours as needed by mouth. (Patient not taking: Reported on 05/18/2017) 20 tablet 0   No facility-administered medications prior to visit.     ROS Review of Systems  Constitutional: Negative for activity change and appetite change.  HENT: Negative for sinus pressure and sore throat.   Eyes: Negative for visual disturbance.  Respiratory: Negative for cough, chest tightness and shortness of breath.   Cardiovascular: Negative for chest pain and leg swelling.  Gastrointestinal: Negative for abdominal distention, abdominal pain, constipation and diarrhea.  Endocrine: Negative.   Genitourinary: Negative for dysuria.  Musculoskeletal: Negative for joint swelling and myalgias.  Skin: Negative for rash.  Allergic/Immunologic: Negative.   Neurological: Negative for weakness, light-headedness and numbness.    Psychiatric/Behavioral: Negative for dysphoric mood and suicidal ideas.    Objective:  BP (!) 144/76   Pulse 60   Temp 97.6 F (36.4 C) (Oral)   Ht '5\' 5"'  (1.651 m)   Wt (!) 301 lb 12.8 oz (136.9 kg)   SpO2 98%   BMI 50.22 kg/m   BP/Weight 05/18/2017 05/16/2017 28/78/6767  Systolic BP 209 99 -  Diastolic BP 76 54 -  Wt. (Lbs) 301.8 299.2 -  BMI 50.22 - 49.79      Physical Exam  Constitutional: He is oriented to person, place, and time. He appears well-developed and well-nourished.  Obese  Neck: No JVD present.  Cardiovascular: Normal rate, normal heart sounds and intact distal pulses.  No murmur heard. Pulmonary/Chest: Effort normal and breath sounds normal. He has no wheezes. He has no rales. He exhibits no tenderness.  Abdominal: Soft. Bowel sounds are normal. He exhibits no distension and no mass. There is no tenderness.  Musculoskeletal: Normal range of motion. He exhibits edema (non pitting b/l pedal edema).  Neurological: He is alert and oriented to person, place, and time.  Skin: Skin is warm and dry.  Psychiatric: He has a normal mood and affect.    Lab Results  Component Value Date   HGBA1C 7.0 03/02/2017     Lab Results  Component Value Date   TSH 129.821 (H) 05/13/2017    Assessment & Plan:   1. DM type 2 with diabetic peripheral neuropathy (HCC) Controlled with A1c of 7.0 Continue current dose of Lantus, metformin and Humalog - POCT glucose (manual entry)  2. Hypothyroidism, unspecified type Uncontrolled with associated myxedema He was recently commenced on levothyroxine however he is yet to pick this up at the pharmacy Will need TSH for 6 weeks from resumption of levothyroxine  3. Essential hypertension Slightly above goal of less than 130/80 Compliance emphasized Low sodium, DASH diet along with lifestyle modifications  4. Coronary artery disease involving native coronary artery of native heart without angina pectoris No angina at this  time Risk factor modification Continue statin  5. Hypokalemia Last potassium was 3.3 Repeat today - CMP14+EGFR  6. Acute on chronic diastolic CHF (congestive heart failure) (HCC) EF 60-65%, grade 2 DD from 2D echo 04/2017 Euvolemic Continue Lasix Keep appointment with cardiology  7. Prolonged QT interval Offending medication thought to be Pepcid, Zofran which were discontinued  8.  Obstructive sleep apnea Lack of medical coverage will be a limitation in  obtaining oxygen to use with his CPAP machine. Right now he does not meet the criteria for use of oxygen during daytime. Sleep study recommended CPAP on18 cm of water and 2 L oxygen supplementation; he will be following up with the sleep specialist in 10 weeks  No orders of the defined types were placed in this encounter.   Follow-up: Return for Chronic medical conditions, keep previously scheduled appointment with PCP.Marland Kitchen   Arnoldo Morale MD

## 2017-05-18 NOTE — Patient Instructions (Signed)
Heart Failure °Heart failure means your heart has trouble pumping blood. This makes it hard for your body to work well. Heart failure is usually a long-term (chronic) condition. You must take good care of yourself and follow your doctor's treatment plan. °Follow these instructions at home: °· Take your heart medicine as told by your doctor. °? Do not stop taking medicine unless your doctor tells you to. °? Do not skip any dose of medicine. °? Refill your medicines before they run out. °? Take other medicines only as told by your doctor or pharmacist. °· Stay active if told by your doctor. The elderly and people with severe heart failure should talk with a doctor about physical activity. °· Eat heart-healthy foods. Choose foods that are without trans fat and are low in saturated fat, cholesterol, and salt (sodium). This includes fresh or frozen fruits and vegetables, fish, lean meats, fat-free or low-fat dairy foods, whole grains, and high-fiber foods. Lentils and dried peas and beans (legumes) are also good choices. °· Limit salt if told by your doctor. °· Cook in a healthy way. Roast, grill, broil, bake, poach, steam, or stir-fry foods. °· Limit fluids as told by your doctor. °· Weigh yourself every morning. Do this after you pee (urinate) and before you eat breakfast. Write down your weight to give to your doctor. °· Take your blood pressure and write it down if your doctor tells you to. °· Ask your doctor how to check your pulse. Check your pulse as told. °· Lose weight if told by your doctor. °· Stop smoking or chewing tobacco. Do not use gum or patches that help you quit without your doctor's approval. °· Schedule and go to doctor visits as told. °· Nonpregnant women should have no more than 1 drink a day. Men should have no more than 2 drinks a day. Talk to your doctor about drinking alcohol. °· Stop illegal drug use. °· Stay current with shots (immunizations). °· Manage your health conditions as told by your  doctor. °· Learn to manage your stress. °· Rest when you are tired. °· If it is really hot outside: °? Avoid intense activities. °? Use air conditioning or fans, or get in a cooler place. °? Avoid caffeine and alcohol. °? Wear loose-fitting, lightweight, and light-colored clothing. °· If it is really cold outside: °? Avoid intense activities. °? Layer your clothing. °? Wear mittens or gloves, a hat, and a scarf when going outside. °? Avoid alcohol. °· Learn about heart failure and get support as needed. °· Get help to maintain or improve your quality of life and your ability to care for yourself as needed. °Contact a doctor if: °· You gain weight quickly. °· You are more short of breath than usual. °· You cannot do your normal activities. °· You tire easily. °· You cough more than normal, especially with activity. °· You have any or more puffiness (swelling) in areas such as your hands, feet, ankles, or belly (abdomen). °· You cannot sleep because it is hard to breathe. °· You feel like your heart is beating fast (palpitations). °· You get dizzy or light-headed when you stand up. °Get help right away if: °· You have trouble breathing. °· There is a change in mental status, such as becoming less alert or not being able to focus. °· You have chest pain or discomfort. °· You faint. °This information is not intended to replace advice given to you by your health care provider. Make sure you   discuss any questions you have with your health care provider. °Document Released: 03/24/2008 Document Revised: 11/21/2015 Document Reviewed: 08/01/2012 °Elsevier Interactive Patient Education © 2017 [employer] Inc[employer] ° °

## 2017-05-19 ENCOUNTER — Telehealth: Payer: Self-pay

## 2017-05-19 ENCOUNTER — Telehealth: Payer: Self-pay | Admitting: Internal Medicine

## 2017-05-19 LAB — CMP14+EGFR
A/G RATIO: 1.3 (ref 1.2–2.2)
ALK PHOS: 83 IU/L (ref 39–117)
ALT: 108 IU/L — ABNORMAL HIGH (ref 0–44)
AST: 63 IU/L — AB (ref 0–40)
Albumin: 4.5 g/dL (ref 3.5–5.5)
BUN/Creatinine Ratio: 15 (ref 9–20)
BUN: 25 mg/dL — ABNORMAL HIGH (ref 6–24)
Bilirubin Total: 0.4 mg/dL (ref 0.0–1.2)
CO2: 22 mmol/L (ref 20–29)
Calcium: 9.2 mg/dL (ref 8.7–10.2)
Chloride: 96 mmol/L (ref 96–106)
Creatinine, Ser: 1.68 mg/dL — ABNORMAL HIGH (ref 0.76–1.27)
GFR calc Af Amer: 55 mL/min/{1.73_m2} — ABNORMAL LOW (ref >59)
GFR calc non Af Amer: 47 mL/min/{1.73_m2} — ABNORMAL LOW (ref >59)
GLOBULIN, TOTAL: 3.4 g/dL (ref 1.5–4.5)
Glucose: 208 mg/dL — ABNORMAL HIGH (ref 65–99)
POTASSIUM: 4.1 mmol/L (ref 3.5–5.2)
SODIUM: 135 mmol/L (ref 134–144)
Total Protein: 7.9 g/dL (ref 6.0–8.5)

## 2017-05-19 NOTE — Telephone Encounter (Signed)
[facility] call to request orders for nursing, hnt and diabetics, please follow up, please call Lawson FiscalLori at [phone]

## 2017-05-19 NOTE — Telephone Encounter (Signed)
Call received from the patient to inform him about the need for follow up with Sleep Center in 10 weeks from sleep study. He said that he was aware.  He also said that the sleep center is aware that he does not have insurance or money for a CPAP machine that can download the sleep activity and for the O2.  He then noted that he was interested in applying for assistance with his hospital bills.  Informed him of the need to apply for the [employer] and [facility] Financial Assistance but [facility] at this [facility] and [facility] to check for availability. He stated that he would call back on Monday, 05/24/17.

## 2017-05-19 NOTE — Telephone Encounter (Signed)
Attempted to contact the patient  to inform him that Dr Royston SinnerAamo has been notified that he does not have insurance or money to purchase/rent a new CPAP machine. He does not qualify for O2.   she recommends no change to the current CPAP and he is to follow up with the sleep center in 10 weeks as recommended.  Call placed to # [phone] and a HIPAA compliant voicemail message was left requesting a call back to # [phone].

## 2017-05-25 ENCOUNTER — Telehealth: Payer: Self-pay | Admitting: Internal Medicine

## 2017-05-25 NOTE — Telephone Encounter (Signed)
Nurse Deberah PeltonLori Darwish from [facility] called to inform that the patient has had a recent weight increase of 9 pounds. Swelling in the hands, and his abdomen increased 4cm. His ankles have increased in diameter.A fluid increase is the cause Please follow up

## 2017-05-26 DIAGNOSIS — E1142 Type 2 diabetes mellitus with diabetic polyneuropathy: Secondary | ICD-10-CM

## 2017-05-27 NOTE — Telephone Encounter (Signed)
Return PC placed to nurse Lawson FiscalLori this a.m. She reports pt wgh up 8-9 lbs since hospital. She saw him 2 days ago. He had purchased and drank V8 veg juice which is high in salt. She also said pt not very complaint with diet whih he blames his mother for. Mother does all the cooking and fries things in fat back. He is compliant with meds including Furosemide and Levothyroxine. She has and will continue to work on educating him and his parents about dietary habits and salt restriction. She told him to stop drinking V8 I advise the same. Also recommend taking an extra 40 mg of Lasix x 2 days. I also inquired about whether pt had his sleep study done. Lawson FiscalLori was not sure but states he does have a CPAP machine at home whih he has been using. She will see him tomorrow. I asked that she inquire from him whether he had the sleep study or was he hospitalized at the time. She will let me know.

## 2017-06-01 ENCOUNTER — Telehealth: Payer: Self-pay | Admitting: Internal Medicine

## 2017-06-01 NOTE — Telephone Encounter (Signed)
Call placed to patient #[phone] to check on his status and to check if he wanted to schedule a financial assistance appointment. Spoke with patient and he informed me that he is going good. He reminded me that he didn't need to schedule an appointment with financial assistant because he had met with one while he was in the hospital. He hasn't heard any updates since then.  ° °Spoke with Deisy, financial counselor, informed me that hospital financial counselor had contacted [employer] since patient was previously employed and had [employer] but he is no longer eligible for it ([employer]). They have recently mailed patient financial application forms.  ° °Called patient and informed him of what Deisy informed me. Patient understood. Pt would like his appointment to be on the same day of appt. Informed patient that I would speak with financial counselor to see if that is possible since they both have full schedules, and I would return the call.  ° °

## 2017-06-03 ENCOUNTER — Telehealth: Payer: Self-pay | Admitting: Internal Medicine

## 2017-06-03 MED FILL — TRUE METRIX TEST STRIP: 30 days supply | Qty: 100 | Fill #1

## 2017-06-03 MED FILL — GABAPENTIN 300 MG CAPSULE: 300 | 20 days supply | Qty: 180 | Fill #2

## 2017-06-03 MED FILL — ?FUROSEMIDE 80MG TABLET: 80 | 30 days supply | Qty: 60 | Fill #1

## 2017-06-03 MED FILL — POTASSIUM CL ER 20 MEQ TAB: 20 | 30 days supply | Qty: 60 | Fill #1

## 2017-06-03 MED FILL — TRUEplus LANCETS 28G MISC: 30 days supply | Qty: 100 | Fill #5

## 2017-06-03 NOTE — Telephone Encounter (Signed)
Spoke with Deisy, Artistfinancial counselor, and she informed me that she can see patient next week on the same day that he will be seeing a provider. She will contact patient to inform him of this as well as give him more information about [facility] financial assistance and the [employer].

## 2017-06-04 ENCOUNTER — Other Ambulatory Visit: Payer: Self-pay | Admitting: Pharmacist

## 2017-06-04 MED ORDER — INSULIN GLARGINE 100 UNIT/ML ~~LOC~~ SOLN: 45.0000 [IU] | Freq: Two times a day (BID) | SUBCUTANEOUS | 2 refills | Status: DC

## 2017-06-04 MED FILL — $LANTUS 100 UNITS/ML VIAL: 100 | 33 days supply | Qty: 30 | Fill #0

## 2017-06-08 ENCOUNTER — Ambulatory Visit: Payer: Self-pay | Admitting: Physician Assistant

## 2017-06-08 ENCOUNTER — Ambulatory Visit: Payer: Self-pay | Admitting: Internal Medicine

## 2017-06-08 ENCOUNTER — Ambulatory Visit: Payer: 59 | Admitting: Sports Medicine

## 2017-06-08 ENCOUNTER — Ambulatory Visit (INDEPENDENT_AMBULATORY_CARE_PROVIDER_SITE_OTHER): Payer: Self-pay | Admitting: Sports Medicine

## 2017-06-08 ENCOUNTER — Encounter: Payer: Self-pay | Admitting: Sports Medicine

## 2017-06-08 DIAGNOSIS — M79609 Pain in unspecified limb: Secondary | ICD-10-CM

## 2017-06-08 DIAGNOSIS — B351 Tinea unguium: Secondary | ICD-10-CM

## 2017-06-08 DIAGNOSIS — E1142 Type 2 diabetes mellitus with diabetic polyneuropathy: Secondary | ICD-10-CM

## 2017-06-08 NOTE — Progress Notes (Signed)
Subjective: Marc Walker is a 46 y.o. male patient with history of diabetes who presents to office today complaining of long, painful nails  while ambulating in shoes; unable to trim. Patient states that the glucose reading this morning was 146 mg/dl. Patient denies any new changes in medication or new problems.  Patient Active Problem List   Diagnosis Date Noted  . Prolonged QT interval 05/14/2017  . Wide-complex tachycardia ([place]) 05/14/2017  . Acute respiratory failure with hypoxia ([place]) 05/13/2017  . GERD (gastroesophageal reflux disease) 05/13/2017  . Depression 05/13/2017  . Acute renal failure superimposed on stage 2 chronic kidney disease ([place]) 05/13/2017  . Hypothyroidism   . Bilateral carpal tunnel syndrome 04/08/2017  . Acute on chronic diastolic CHF (congestive heart failure) ([place]) 03/09/2017  . Erectile dysfunction 03/02/2017  . OSA on CPAP 07/06/2016  . Morbid obesity ([place]) 07/06/2016  . DM type 2 with diabetic peripheral neuropathy ([place]) 12/16/2015  . CAD (coronary artery disease) 09/30/2015  . Essential hypertension 09/16/2015  . Hyperlipidemia   . History of MI (myocardial infarction) 07/30/2009   Current Outpatient Medications on File Prior to Visit  Medication Sig Dispense Refill  . aspirin 81 MG tablet Take 1 tablet (81 mg total) by mouth daily. 90 tablet 3  . buPROPion (WELLBUTRIN SR) 150 MG 12 hr tablet Take 1 tablet (150 mg total) by mouth 2 (two) times daily. 60 tablet 2  . clopidogrel (PLAVIX) 75 MG tablet Take 1 tablet (75 mg total) by mouth daily. 30 tablet 10  . empagliflozin (JARDIANCE) 10 MG TABS tablet Take 10 mg by mouth daily. 90 tablet 3  . ezetimibe (ZETIA) 10 MG tablet Take 1 tablet (10 mg total) by mouth daily. 90 tablet 3  . famotidine (PEPCID) 20 MG tablet Take 1 tablet (20 mg total) by mouth 2 (two) times daily. 60 tablet 3  . furosemide (LASIX) 80 MG tablet Take 1 tablet (80 mg total) 2 (two) times daily by mouth. 180 tablet 3  . gabapentin  (NEURONTIN) 300 MG capsule Take 3 capsules (900 mg total) by mouth 3 (three) times daily. 180 capsule 2  . insulin glargine (LANTUS) 100 UNIT/ML injection Inject 0.45 mLs (45 Units total) into the skin 2 (two) times daily. 30 mL 2  . insulin lispro (HUMALOG) 100 UNIT/ML injection Inject 0.2 mLs (20 Units total) into the skin 3 (three) times daily with meals. 20 mL 2  . levothyroxine (SYNTHROID, LEVOTHROID) 50 MCG tablet Take 1 tablet (50 mcg total) daily before breakfast by mouth. (Patient not taking: Reported on 05/18/2017) 30 tablet 1  . metFORMIN (GLUCOPHAGE-XR) 500 MG 24 hr tablet TAKE 2 TABLETS BY MOUTH 2 TIMES DAILY WITH A MEAL. (Patient taking differently: Take 1,000 mg 2 (two) times daily by mouth. TAKE 2 TABLETS BY MOUTH 2 TIMES DAILY WITH A MEAL.) 120 tablet 11  . metoprolol tartrate (LOPRESSOR) 50 MG tablet Take 1 tablet (50 mg total) by mouth 2 (two) times daily. 90 tablet 11  . nitroGLYCERIN (NITROSTAT) 0.4 MG SL tablet Place 1 tablet (0.4 mg total) under the tongue every 5 (five) minutes as needed for chest pain. 30 tablet 12  . polyethylene glycol (MIRALAX) packet Take 17 g by mouth daily. 14 each 0  . Potassium Chloride ER 20 MEQ TBCR Take 40 mEq 2 (two) times daily by mouth. 180 tablet 2  . rosuvastatin (CRESTOR) 40 MG tablet Take 1 tablet (40 mg total) by mouth daily. 90 tablet 3  . traMADol (ULTRAM) 50 MG tablet  Take 1 tablet (50 mg total) every 6 (six) hours as needed by mouth. (Patient not taking: Reported on 05/18/2017) 20 tablet 0   No current facility-administered medications on file prior to visit.    Allergies  Allergen Reactions  . Nsaids     Avoid due to CKD  . 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  Pt only wants pain medicine if absolutely necessary.  . Levofloxacin Rash    Rash on back (not sun exposed)and hypersensitivity to skin on exposed skin/arm    Recent Results (from the past 2160 hour(s))  POCT glucose (manual entry)     Status:  Abnormal   Collection Time: 04/08/17 10:57 AM  Result Value Ref Range   POC Glucose 149 (A) 70 - 99 mg/dl  Comprehensive metabolic panel     Status: Abnormal   Collection Time: 04/08/17 11:53 AM  Result Value Ref Range   Glucose 144 (H) 65 - 99 mg/dL   BUN 16 6 - 24 mg/dL   Creatinine, Ser 1.25 0.76 - 1.27 mg/dL   GFR calc non Af Amer 68 >59 mL/min/1.73   GFR calc Af Amer 79 >59 mL/min/1.73   BUN/Creatinine Ratio 13 9 - 20   Sodium 137 134 - 144 mmol/L   Potassium 4.0 3.5 - 5.2 mmol/L   Chloride 95 (L) 96 - 106 mmol/L   CO2 25 20 - 29 mmol/L   Calcium 9.8 8.7 - 10.2 mg/dL   Total Protein 7.2 6.0 - 8.5 g/dL   Albumin 4.3 3.5 - 5.5 g/dL   Globulin, Total 2.9 1.5 - 4.5 g/dL   Albumin/Globulin Ratio 1.5 1.2 - 2.2   Bilirubin Total 0.3 0.0 - 1.2 mg/dL   Alkaline Phosphatase 70 39 - 117 IU/L   AST 25 0 - 40 IU/L   ALT 34 0 - 44 IU/L  Basic metabolic panel     Status: Abnormal   Collection Time: 04/25/17  6:57 PM  Result Value Ref Range   Sodium 133 (L) 135 - 145 mmol/L   Potassium 3.8 3.5 - 5.1 mmol/L   Chloride 97 (L) 101 - 111 mmol/L   CO2 25 22 - 32 mmol/L   Glucose, Bld 196 (H) 65 - 99 mg/dL   BUN 21 (H) 6 - 20 mg/dL   Creatinine, Ser 1.36 (H) 0.61 - 1.24 mg/dL   Calcium 9.1 8.9 - 10.3 mg/dL   GFR calc non Af Amer >60 >60 mL/min   GFR calc Af Amer >60 >60 mL/min    Comment: (NOTE) The eGFR has been calculated using the CKD EPI equation. This calculation has not been validated in all clinical situations. eGFR's persistently <60 mL/min signify possible Chronic Kidney Disease.    Anion gap 11 5 - 15  CBC     Status: Abnormal   Collection Time: 04/25/17  6:57 PM  Result Value Ref Range   WBC 11.8 (H) 4.0 - 10.5 K/uL   RBC 3.84 (L) 4.22 - 5.81 MIL/uL   Hemoglobin 11.9 (L) 13.0 - 17.0 g/dL   HCT 36.4 (L) 39.0 - 52.0 %   MCV 94.8 78.0 - 100.0 fL   MCH 31.0 26.0 - 34.0 pg   MCHC 32.7 30.0 - 36.0 g/dL   RDW 14.6 11.5 - 15.5 %   Platelets 218 150 - 400 K/uL  I-stat  troponin, ED     Status: None   Collection Time: 04/25/17  7:23 PM  Result  Value Ref Range   Troponin i, poc 0.00 0.00 - 0.08 ng/mL   Comment 3            Comment: Due to the release kinetics of cTnI, a negative result within the first hours of the onset of symptoms does not rule out myocardial infarction with certainty. If myocardial infarction is still suspected, repeat the test at appropriate intervals.   CBC     Status: Abnormal   Collection Time: 05/01/17  4:09 PM  Result Value Ref Range   WBC 10.8 (H) 4.0 - 10.5 K/uL   RBC 3.74 (L) 4.22 - 5.81 MIL/uL   Hemoglobin 11.7 (L) 13.0 - 17.0 g/dL   HCT 35.7 (L) 39.0 - 52.0 %   MCV 95.5 78.0 - 100.0 fL   MCH 31.3 26.0 - 34.0 pg   MCHC 32.8 30.0 - 36.0 g/dL   RDW 14.1 11.5 - 15.5 %   Platelets 210 150 - 400 K/uL  Comprehensive metabolic panel     Status: Abnormal   Collection Time: 05/01/17  4:09 PM  Result Value Ref Range   Sodium 135 135 - 145 mmol/L   Potassium 3.7 3.5 - 5.1 mmol/L   Chloride 97 (L) 101 - 111 mmol/L   CO2 28 22 - 32 mmol/L   Glucose, Bld 181 (H) 65 - 99 mg/dL   BUN 30 (H) 6 - 20 mg/dL   Creatinine, Ser 1.45 (H) 0.61 - 1.24 mg/dL   Calcium 9.7 8.9 - 10.3 mg/dL   Total Protein 7.5 6.5 - 8.1 g/dL   Albumin 4.0 3.5 - 5.0 g/dL   AST 32 15 - 41 U/L   ALT 24 17 - 63 U/L   Alkaline Phosphatase 60 38 - 126 U/L   Total Bilirubin 0.6 0.3 - 1.2 mg/dL   GFR calc non Af Amer 56 (L) >60 mL/min   GFR calc Af Amer >60 >60 mL/min    Comment: (NOTE) The eGFR has been calculated using the CKD EPI equation. This calculation has not been validated in all clinical situations. eGFR's persistently <60 mL/min signify possible Chronic Kidney Disease.    Anion gap 10 5 - 15  Basic metabolic panel     Status: Abnormal   Collection Time: 05/06/17  3:09 PM  Result Value Ref Range   Glucose 115 (H) 65 - 99 mg/dL   BUN 19 6 - 24 mg/dL   Creatinine, Ser 1.77 (H) 0.76 - 1.27 mg/dL   GFR calc non Af Amer 45 (L) >59 mL/min/1.73    GFR calc Af Amer 52 (L) >59 mL/min/1.73   BUN/Creatinine Ratio 11 9 - 20   Sodium 139 134 - 144 mmol/L   Potassium 4.2 3.5 - 5.2 mmol/L   Chloride 97 96 - 106 mmol/L   CO2 24 20 - 29 mmol/L   Calcium 9.8 8.7 - 10.2 mg/dL  Pro b natriuretic peptide     Status: None   Collection Time: 05/06/17  3:09 PM  Result Value Ref Range   NT-Pro BNP 95 0 - 121 pg/mL    Comment: The following cut-points have been suggested for the use of proBNP for the diagnostic evaluation of heart failure (HF) in patients with acute dyspnea: Modality                     Age           Optimal Cut                            (  years)            Point ------------------------------------------------------ Diagnosis (rule in HF)        <50            450 pg/mL                           50 - 75            900 pg/mL                               >75           1800 pg/mL Exclusion (rule out HF)  Age independent     300 pg/mL   Basic metabolic panel     Status: Abnormal   Collection Time: 05/13/17  3:47 PM  Result Value Ref Range   Sodium 137 135 - 145 mmol/L   Potassium 3.9 3.5 - 5.1 mmol/L   Chloride 97 (L) 101 - 111 mmol/L   CO2 32 22 - 32 mmol/L   Glucose, Bld 232 (H) 65 - 99 mg/dL   BUN 26 (H) 6 - 20 mg/dL   Creatinine, Ser 1.82 (H) 0.61 - 1.24 mg/dL   Calcium 9.4 8.9 - 10.3 mg/dL   GFR calc non Af Amer 43 (L) >60 mL/min   GFR calc Af Amer 49 (L) >60 mL/min    Comment: (NOTE) The eGFR has been calculated using the CKD EPI equation. This calculation has not been validated in all clinical situations. eGFR's persistently <60 mL/min signify possible Chronic Kidney Disease.    Anion gap 8 5 - 15  CBC     Status: Abnormal   Collection Time: 05/13/17  3:47 PM  Result Value Ref Range   WBC 12.6 (H) 4.0 - 10.5 K/uL   RBC 3.79 (L) 4.22 - 5.81 MIL/uL   Hemoglobin 11.9 (L) 13.0 - 17.0 g/dL   HCT 36.0 (L) 39.0 - 52.0 %   MCV 95.0 78.0 - 100.0 fL   MCH 31.4 26.0 - 34.0 pg   MCHC 33.1 30.0 - 36.0 g/dL   RDW 13.8  11.5 - 15.5 %   Platelets 211 150 - 400 K/uL  I-stat troponin, ED     Status: None   Collection Time: 05/13/17  3:56 PM  Result Value Ref Range   Troponin i, poc 0.01 0.00 - 0.08 ng/mL   Comment 3            Comment: Due to the release kinetics of cTnI, a negative result within the first hours of the onset of symptoms does not rule out myocardial infarction with certainty. If myocardial infarction is still suspected, repeat the test at appropriate intervals.   T4, free     Status: Abnormal   Collection Time: 05/13/17  5:24 PM  Result Value Ref Range   Free T4 <0.25 (L) 0.61 - 1.12 ng/dL    Comment: REPEATED TO VERIFY (NOTE) Biotin ingestion may interfere with free T4 tests. If the results are inconsistent with the TSH level, previous test results, or the clinical presentation, then consider biotin interference. If needed, order repeat testing after stopping biotin.   Urinalysis, Routine w reflex microscopic     Status: Abnormal   Collection Time: 05/13/17  7:43 PM  Result Value Ref Range   Color, Urine STRAW (A) YELLOW   APPearance CLEAR CLEAR   Specific Gravity,  Urine 1.011 1.005 - 1.030   pH 6.0 5.0 - 8.0   Glucose, UA >=500 (A) NEGATIVE mg/dL   Hgb urine dipstick NEGATIVE NEGATIVE   Bilirubin Urine NEGATIVE NEGATIVE   Ketones, ur NEGATIVE NEGATIVE mg/dL   Protein, ur NEGATIVE NEGATIVE mg/dL   Nitrite NEGATIVE NEGATIVE   Leukocytes, UA NEGATIVE NEGATIVE   RBC / HPF 0-5 0 - 5 RBC/hpf   WBC, UA 0-5 0 - 5 WBC/hpf   Bacteria, UA RARE (A) NONE SEEN   Squamous Epithelial / LPF 0-5 (A) NONE SEEN   Hyaline Casts, UA PRESENT    Sperm, UA PRESENT   RPR     Status: None   Collection Time: 05/13/17  9:30 PM  Result Value Ref Range   RPR Ser Ql Non Reactive Non Reactive    Comment: (NOTE) Performed At: [facility] [place], [place] [id number] Rush Farmer MD SO:[id number]   C-reactive protein     Status: None   Collection Time: 05/13/17  9:30 PM   Result Value Ref Range   CRP <0.8 <1.0 mg/dL  Sedimentation rate     Status: Abnormal   Collection Time: 05/13/17  9:30 PM  Result Value Ref Range   Sed Rate 38 (H) 0 - 16 mm/hr  Procalcitonin     Status: None   Collection Time: 05/13/17  9:30 PM  Result Value Ref Range   Procalcitonin <0.10 ng/mL    Comment:        Interpretation: PCT (Procalcitonin) <= 0.5 ng/mL: Systemic infection (sepsis) is not likely. Local bacterial infection is possible. (NOTE)         ICU PCT Algorithm               Non ICU PCT Algorithm    ----------------------------     ------------------------------         PCT < 0.25 ng/mL                 PCT < 0.1 ng/mL     Stopping of antibiotics            Stopping of antibiotics       strongly encouraged.               strongly encouraged.    ----------------------------     ------------------------------       PCT level decrease by               PCT < 0.25 ng/mL       >= 80% from peak PCT       OR PCT 0.25 - 0.5 ng/mL          Stopping of antibiotics                                             encouraged.     Stopping of antibiotics           encouraged.    ----------------------------     ------------------------------       PCT level decrease by              PCT >= 0.25 ng/mL       < 80% from peak PCT        AND PCT >= 0.5 ng/mL            Continuin g antibiotics  encouraged.       Continuing antibiotics            encouraged.    ----------------------------     ------------------------------     PCT level increase compared          PCT > 0.5 ng/mL         with peak PCT AND          PCT >= 0.5 ng/mL             Escalation of antibiotics                                          strongly encouraged.      Escalation of antibiotics        strongly encouraged.   Protime-INR     Status: None   Collection Time: 05/13/17  9:30 PM  Result Value Ref Range   Prothrombin Time 12.8 11.4 - 15.2 seconds   INR 0.97    Troponin I (q 6hr x 3)     Status: None   Collection Time: 05/13/17  9:30 PM  Result Value Ref Range   Troponin I <0.03 <0.03 ng/mL  HIV antibody (Routine Testing)     Status: None   Collection Time: 05/13/17  9:30 PM  Result Value Ref Range   HIV Screen 4th Generation wRfx Non Reactive Non Reactive    Comment: (NOTE) Performed At: [facility] [address] [place], [place] [id number] Rush Farmer MD [id number]   T3, free     Status: Abnormal   Collection Time: 05/13/17  9:31 PM  Result Value Ref Range   T3, Free 1.0 (L) 2.0 - 4.4 pg/mL    Comment: (NOTE) Performed At: [facility] [address] [place], [place] [id number] Rush Farmer MD [id number]   Lactic acid, plasma     Status: None   Collection Time: 05/13/17  9:31 PM  Result Value Ref Range   Lactic Acid, Venous 1.5 0.5 - 1.9 mmol/L  TSH     Status: Abnormal   Collection Time: 05/13/17  9:41 PM  Result Value Ref Range   TSH 129.821 (H) 0.350 - 4.500 uIU/mL    Comment: Performed by a 3rd Generation assay with a functional sensitivity of <=0.01 uIU/mL.  Brain natriuretic peptide     Status: None   Collection Time: 05/13/17  9:41 PM  Result Value Ref Range   B Natriuretic Peptide 24.2 0.0 - 100.0 pg/mL  Hepatic function panel     Status: Abnormal   Collection Time: 05/13/17  9:41 PM  Result Value Ref Range   Total Protein 7.4 6.5 - 8.1 g/dL   Albumin 4.0 3.5 - 5.0 g/dL   AST 158 (H) 15 - 41 U/L   ALT 132 (H) 17 - 63 U/L   Alkaline Phosphatase 68 38 - 126 U/L   Total Bilirubin 0.3 0.3 - 1.2 mg/dL   Bilirubin, Direct <0.1 (L) 0.1 - 0.5 mg/dL   Indirect Bilirubin NOT CALCULATED 0.3 - 0.9 mg/dL  Culture, blood (x 2)     Status: None   Collection Time: 05/13/17  9:55 PM  Result Value Ref Range   Specimen Description BLOOD LEFT ANTECUBITAL    Special Requests      BOTTLES DRAWN AEROBIC AND ANAEROBIC Blood Culture adequate volume   Culture NO GROWTH 5 DAYS    Report  Status 05/18/2017  FINAL   Culture, blood (x 2)     Status: None   Collection Time: 05/13/17 10:53 PM  Result Value Ref Range   Specimen Description BLOOD RIGHT ANTECUBITAL    Special Requests IN PEDIATRIC BOTTLE Blood Culture adequate volume    Culture NO GROWTH 5 DAYS    Report Status 05/18/2017 FINAL   Glucose, capillary     Status: Abnormal   Collection Time: 05/13/17 11:15 PM  Result Value Ref Range   Glucose-Capillary 242 (H) 65 - 99 mg/dL  Lactic acid, plasma     Status: Abnormal   Collection Time: 05/14/17 12:45 AM  Result Value Ref Range   Lactic Acid, Venous 2.4 (HH) 0.5 - 1.9 mmol/L    Comment: CRITICAL RESULT CALLED TO, READ BACK BY AND VERIFIED WITH: THOMAS,J RN 05/14/2017 [id number] JORDANS   Cortisol-am, blood     Status: Abnormal   Collection Time: 05/14/17  2:30 AM  Result Value Ref Range   Cortisol - AM 34.0 (H) 6.7 - 22.6 ug/dL  Troponin I (q 6hr x 3)     Status: None   Collection Time: 05/14/17  2:30 AM  Result Value Ref Range   Troponin I <0.03 <0.03 ng/mL  Basic metabolic panel     Status: Abnormal   Collection Time: 05/14/17  2:30 AM  Result Value Ref Range   Sodium 135 135 - 145 mmol/L   Potassium 3.8 3.5 - 5.1 mmol/L   Chloride 97 (L) 101 - 111 mmol/L   CO2 26 22 - 32 mmol/L   Glucose, Bld 256 (H) 65 - 99 mg/dL   BUN 29 (H) 6 - 20 mg/dL   Creatinine, Ser 1.89 (H) 0.61 - 1.24 mg/dL   Calcium 9.1 8.9 - 10.3 mg/dL   GFR calc non Af Amer 41 (L) >60 mL/min   GFR calc Af Amer 47 (L) >60 mL/min    Comment: (NOTE) The eGFR has been calculated using the CKD EPI equation. This calculation has not been validated in all clinical situations. eGFR's persistently <60 mL/min signify possible Chronic Kidney Disease.    Anion gap 12 5 - 15  Lactic acid, plasma     Status: None   Collection Time: 05/14/17  2:30 AM  Result Value Ref Range   Lactic Acid, Venous 1.2 0.5 - 1.9 mmol/L  Glucose, capillary     Status: Abnormal   Collection Time: 05/14/17  7:42 AM  Result Value Ref Range    Glucose-Capillary 242 (H) 65 - 99 mg/dL   Comment 1 Notify RN   Troponin I (q 6hr x 3)     Status: None   Collection Time: 05/14/17  9:14 AM  Result Value Ref Range   Troponin I <0.03 <0.03 ng/mL  Magnesium     Status: Abnormal   Collection Time: 05/14/17  9:14 AM  Result Value Ref Range   Magnesium 2.7 (H) 1.7 - 2.4 mg/dL  Glucose, capillary     Status: Abnormal   Collection Time: 05/14/17 11:49 AM  Result Value Ref Range   Glucose-Capillary 304 (H) 65 - 99 mg/dL   Comment 1 Notify RN   Glucose, capillary     Status: Abnormal   Collection Time: 05/14/17  3:54 PM  Result Value Ref Range   Glucose-Capillary 250 (H) 65 - 99 mg/dL  ECHOCARDIOGRAM COMPLETE     Status: Abnormal   Collection Time: 05/14/17  5:13 PM  Result Value Ref Range   Weight 4,756.8 oz  Height 65 in   BP 90/52 mmHg   LV PW d 12 (A) 0.6 - 1.1 mm   FS 39 28 - 44 %   LA vol 59.7 mL   LA ID, A-P, ES 37 mm   IVS/LV PW RATIO, ED 1    LVOT VTI 27.4 cm   LV e' LATERAL 10.2 cm/s   LV E/e' medial 8.31    LV E/e'average 8.31    LA diam index 1.58 cm/m2   LA vol A4C 61.2 ml   LVOT peak grad rest 5 mmHg   LVOT diameter 20 mm   LVOT area 3.14 cm2   LVOT peak vel 117 cm/s   LVOT SV 86.00 mL   Peak grad 3 mmHg   E/e' ratio 8.31    MV pk E vel 84.8 m/s   MV pk A vel 67.8 m/s   LA vol index 25.5 mL/m2   LA diam end sys 37.00 mm   TDI e' medial 6.19    TDI e' lateral 10.20    Lateral S' vel 13.60 cm/sec   TAPSE 24.20 mm  Glucose, capillary     Status: Abnormal   Collection Time: 05/14/17  9:11 PM  Result Value Ref Range   Glucose-Capillary 274 (H) 65 - 99 mg/dL  Basic metabolic panel     Status: Abnormal   Collection Time: 05/15/17  6:29 AM  Result Value Ref Range   Sodium 136 135 - 145 mmol/L   Potassium 3.3 (L) 3.5 - 5.1 mmol/L   Chloride 97 (L) 101 - 111 mmol/L   CO2 27 22 - 32 mmol/L   Glucose, Bld 205 (H) 65 - 99 mg/dL   BUN 33 (H) 6 - 20 mg/dL   Creatinine, Ser 1.75 (H) 0.61 - 1.24 mg/dL   Calcium  8.5 (L) 8.9 - 10.3 mg/dL   GFR calc non Af Amer 45 (L) >60 mL/min   GFR calc Af Amer 52 (L) >60 mL/min    Comment: (NOTE) The eGFR has been calculated using the CKD EPI equation. This calculation has not been validated in all clinical situations. eGFR's persistently <60 mL/min signify possible Chronic Kidney Disease.    Anion gap 12 5 - 15  Glucose, capillary     Status: Abnormal   Collection Time: 05/15/17  7:37 AM  Result Value Ref Range   Glucose-Capillary 199 (H) 65 - 99 mg/dL   Comment 1 Notify RN    Comment 2 Document in Chart   Glucose, capillary     Status: Abnormal   Collection Time: 05/15/17 11:43 AM  Result Value Ref Range   Glucose-Capillary 195 (H) 65 - 99 mg/dL   Comment 1 Notify RN    Comment 2 Document in Chart   Glucose, capillary     Status: Abnormal   Collection Time: 05/15/17  4:02 PM  Result Value Ref Range   Glucose-Capillary 208 (H) 65 - 99 mg/dL   Comment 1 Notify RN    Comment 2 Document in Chart   Sedimentation rate     Status: Abnormal   Collection Time: 05/15/17  5:19 PM  Result Value Ref Range   Sed Rate 41 (H) 0 - 16 mm/hr  CK     Status: Abnormal   Collection Time: 05/15/17  5:19 PM  Result Value Ref Range   Total CK 619 (H) 49 - 397 U/L  Glucose, capillary     Status: Abnormal   Collection Time: 05/15/17  9:21 PM  Result Value Ref Range   Glucose-Capillary 178 (H) 65 - 99 mg/dL  Basic metabolic panel     Status: Abnormal   Collection Time: 05/16/17  5:36 AM  Result Value Ref Range   Sodium 136 135 - 145 mmol/L   Potassium 3.3 (L) 3.5 - 5.1 mmol/L   Chloride 101 101 - 111 mmol/L   CO2 28 22 - 32 mmol/L   Glucose, Bld 141 (H) 65 - 99 mg/dL   BUN 28 (H) 6 - 20 mg/dL   Creatinine, Ser 1.47 (H) 0.61 - 1.24 mg/dL   Calcium 8.2 (L) 8.9 - 10.3 mg/dL   GFR calc non Af Amer 55 (L) >60 mL/min   GFR calc Af Amer >60 >60 mL/min    Comment: (NOTE) The eGFR has been calculated using the CKD EPI equation. This calculation has not been validated  in all clinical situations. eGFR's persistently <60 mL/min signify possible Chronic Kidney Disease.    Anion gap 7 5 - 15  Glucose, capillary     Status: Abnormal   Collection Time: 05/16/17  7:20 AM  Result Value Ref Range   Glucose-Capillary 163 (H) 65 - 99 mg/dL   Comment 1 Notify RN    Comment 2 Document in Chart   Glucose, capillary     Status: Abnormal   Collection Time: 05/16/17 11:46 AM  Result Value Ref Range   Glucose-Capillary 166 (H) 65 - 99 mg/dL   Comment 1 Notify RN    Comment 2 Document in Chart   Troponin I     Status: None   Collection Time: 05/16/17  3:02 PM  Result Value Ref Range   Troponin I <0.03 <0.03 ng/mL  POCT glucose (manual entry)     Status: Abnormal   Collection Time: 05/18/17  9:50 AM  Result Value Ref Range   POC Glucose 215 (A) 70 - 99 mg/dl  CMP14+EGFR     Status: Abnormal   Collection Time: 05/18/17 10:26 AM  Result Value Ref Range   Glucose 208 (H) 65 - 99 mg/dL   BUN 25 (H) 6 - 24 mg/dL   Creatinine, Ser 1.68 (H) 0.76 - 1.27 mg/dL   GFR calc non Af Amer 47 (L) >59 mL/min/1.73   GFR calc Af Amer 55 (L) >59 mL/min/1.73   BUN/Creatinine Ratio 15 9 - 20   Sodium 135 134 - 144 mmol/L   Potassium 4.1 3.5 - 5.2 mmol/L   Chloride 96 96 - 106 mmol/L   CO2 22 20 - 29 mmol/L   Calcium 9.2 8.7 - 10.2 mg/dL   Total Protein 7.9 6.0 - 8.5 g/dL   Albumin 4.5 3.5 - 5.5 g/dL   Globulin, Total 3.4 1.5 - 4.5 g/dL   Albumin/Globulin Ratio 1.3 1.2 - 2.2   Bilirubin Total 0.4 0.0 - 1.2 mg/dL   Alkaline Phosphatase 83 39 - 117 IU/L   AST 63 (H) 0 - 40 IU/L   ALT 108 (H) 0 - 44 IU/L    Objective: General: Patient is awake, alert, and oriented x 3 and in no acute distress.  Integument: Skin is warm, dry and supple bilateral. Nails are tender, long, thickened and  dystrophic with subungual debris, consistent with onychomycosis, 1-5 bilateral. No signs of infection. No open lesions or preulcerative lesions present bilateral. Remaining integument  unremarkable.  Vasculature:  Dorsalis Pedis pulse 1/4 bilateral. Posterior Tibial pulse  1/4 bilateral.  Capillary fill time <3 sec 1-5 bilateral. Positive hair growth to the level of  the digits. Temperature gradient within normal limits. + varicosities present bilateral. No edema present bilateral.   Neurology: The patient has intact sensation measured with a 5.07/10g Semmes Weinstein Monofilament at all pedal sites bilateral. Vibratory sensation diminished bilateral with tuning fork. No Babinski sign present bilateral.   Musculoskeletal: Asymptomatic pes planus pedal deformities noted bilateral. Muscular strength 5/5 in all lower extremity muscular groups bilateral without pain on range of motion . No tenderness with calf compression bilateral.  Assessment and Plan: Problem List Items Addressed This Visit    None    Visit Diagnoses    Pain due to onychomycosis of nail    -  Primary   Diabetic polyneuropathy associated with type 2 diabetes mellitus ([place])         -Examined patient. -Discussed and educated patient on diabetic foot care, especially with  regards to the vascular, neurological and musculoskeletal systems.  -Stressed the importance of good glycemic control and the detriment of not controlling glucose levels in relation to the foot. -Mechanically debrided all nails 1-5 bilateral using sterile nail nipper and filed with dremel without incident  -Answered all patient questions -Patient to return  in 3 months for at risk foot care -Patient advised to call the office if any problems or questions arise in the meantime.  Landis Martins, DPM

## 2017-06-11 ENCOUNTER — Encounter: Payer: Self-pay | Admitting: Internal Medicine

## 2017-06-11 ENCOUNTER — Ambulatory Visit: Payer: Self-pay | Attending: Internal Medicine | Admitting: Internal Medicine

## 2017-06-11 VITALS — BP 105/60 | HR 77 | Temp 98.5°F | Resp 18 | Ht 66.0 in | Wt 292.0 lb

## 2017-06-11 DIAGNOSIS — E1142 Type 2 diabetes mellitus with diabetic polyneuropathy: Secondary | ICD-10-CM

## 2017-06-11 DIAGNOSIS — I2510 Atherosclerotic heart disease of native coronary artery without angina pectoris: Secondary | ICD-10-CM | POA: Insufficient documentation

## 2017-06-11 DIAGNOSIS — G4733 Obstructive sleep apnea (adult) (pediatric): Secondary | ICD-10-CM | POA: Insufficient documentation

## 2017-06-11 DIAGNOSIS — Z6841 Body Mass Index (BMI) 40.0 and over, adult: Secondary | ICD-10-CM | POA: Insufficient documentation

## 2017-06-11 DIAGNOSIS — I10 Essential (primary) hypertension: Secondary | ICD-10-CM

## 2017-06-11 DIAGNOSIS — G5603 Carpal tunnel syndrome, bilateral upper limbs: Secondary | ICD-10-CM | POA: Insufficient documentation

## 2017-06-11 DIAGNOSIS — I130 Hypertensive heart and chronic kidney disease with heart failure and stage 1 through stage 4 chronic kidney disease, or unspecified chronic kidney disease: Secondary | ICD-10-CM | POA: Insufficient documentation

## 2017-06-11 DIAGNOSIS — N182 Chronic kidney disease, stage 2 (mild): Secondary | ICD-10-CM | POA: Insufficient documentation

## 2017-06-11 DIAGNOSIS — F329 Major depressive disorder, single episode, unspecified: Secondary | ICD-10-CM | POA: Insufficient documentation

## 2017-06-11 DIAGNOSIS — E785 Hyperlipidemia, unspecified: Secondary | ICD-10-CM | POA: Insufficient documentation

## 2017-06-11 DIAGNOSIS — I252 Old myocardial infarction: Secondary | ICD-10-CM | POA: Insufficient documentation

## 2017-06-11 DIAGNOSIS — Z87891 Personal history of nicotine dependence: Secondary | ICD-10-CM | POA: Insufficient documentation

## 2017-06-11 DIAGNOSIS — N529 Male erectile dysfunction, unspecified: Secondary | ICD-10-CM | POA: Insufficient documentation

## 2017-06-11 DIAGNOSIS — I5033 Acute on chronic diastolic (congestive) heart failure: Secondary | ICD-10-CM | POA: Insufficient documentation

## 2017-06-11 DIAGNOSIS — E039 Hypothyroidism, unspecified: Secondary | ICD-10-CM | POA: Insufficient documentation

## 2017-06-11 DIAGNOSIS — Z7982 Long term (current) use of aspirin: Secondary | ICD-10-CM | POA: Insufficient documentation

## 2017-06-11 DIAGNOSIS — I5032 Chronic diastolic (congestive) heart failure: Secondary | ICD-10-CM

## 2017-06-11 DIAGNOSIS — E1140 Type 2 diabetes mellitus with diabetic neuropathy, unspecified: Secondary | ICD-10-CM | POA: Insufficient documentation

## 2017-06-11 DIAGNOSIS — K219 Gastro-esophageal reflux disease without esophagitis: Secondary | ICD-10-CM | POA: Insufficient documentation

## 2017-06-11 DIAGNOSIS — Z794 Long term (current) use of insulin: Secondary | ICD-10-CM | POA: Insufficient documentation

## 2017-06-11 LAB — GLUCOSE, POCT (MANUAL RESULT ENTRY): POC GLUCOSE: 205 mg/dL — AB (ref 70–99)

## 2017-06-11 LAB — POCT GLYCOSYLATED HEMOGLOBIN (HGB A1C): HEMOGLOBIN A1C: 7.2

## 2017-06-11 MED ORDER — INSULIN SYRINGES (DISPOSABLE) U-100 0.5 ML MISC: 11 refills | Status: AC

## 2017-06-11 MED FILL — TRUEPLUS SYR 0.5ML 31GX5/16: 31G X 5/16" | 100 days supply | Qty: 100 | Fill #0

## 2017-06-11 MED FILL — !HUMALOG 100 UNITS/ML KWIKP: 100 | 25 days supply | Qty: 15 | Fill #3

## 2017-06-11 NOTE — Progress Notes (Signed)
Patient ID: Marc MortimerJames Guyette, male    DOB: 02-13-1971  MRN: [id number]  CC: Follow-up   Subjective: Marc Walker is a 46 y.o. male who presents for chronic ds management His concerns today include:  46 year old male with history of CAD (8stents), DM type II, HL, HTN, obesity, OSA on CPAP, tobacco dependence, BPH.  Pt hosp last mth with acute on chronic diastolic CHF, acute hypoxic resp failure and new dx of hypothyroid  1 Thyroid:  -compliant with Levoxyl -denies palpitations, hot/cold spills -swelling in hands better  2. OSA Had sleep study. CPAP pressure inc from 14 to 18 cmH20. Reports day time sleepiness has resolved. More alert Needs nocturnal O2 but no insurance He is applying for [employer] [employer] and needs appt with our Artistfinancial counselor.   3. DM: compliant with Lantus and Humalog. Would like to change tto viles of Novolog when he is done with current pack of Humalog pens. Has Lantus viles already Checking BS 5 x a day. Reports range 80-130 -trying to do better with eating habits. Mom does the cooking  4. HTN/CHF: -intermittent LE edema. He has cut back on salt. Wgh down 9 lbs from 3 wks ago No SOB at rest but with exerction Sleeps on 2 pillows -out of K+ several wks. Just fill today  Patient Active Problem List   Diagnosis Date Noted  . Prolonged QT interval 05/14/2017  . Wide-complex tachycardia (HCC) 05/14/2017  . Acute respiratory failure with hypoxia (HCC) 05/13/2017  . GERD (gastroesophageal reflux disease) 05/13/2017  . Depression 05/13/2017  . Acute renal failure superimposed on stage 2 chronic kidney disease (HCC) 05/13/2017  . Hypothyroidism   . Bilateral carpal tunnel syndrome 04/08/2017  . Acute on chronic diastolic CHF (congestive heart failure) (HCC) 03/09/2017  . Erectile dysfunction 03/02/2017  . OSA on CPAP 07/06/2016  . Morbid obesity (HCC) 07/06/2016  . DM type 2 with diabetic peripheral neuropathy (HCC) 12/16/2015  . CAD (coronary artery  disease) 09/30/2015  . Essential hypertension 09/16/2015  . Hyperlipidemia   . History of MI (myocardial infarction) 07/30/2009     Current Outpatient Medications on File Prior to Visit  Medication Sig Dispense Refill  . aspirin 81 MG tablet Take 1 tablet (81 mg total) by mouth daily. 90 tablet 3  . buPROPion (WELLBUTRIN SR) 150 MG 12 hr tablet Take 1 tablet (150 mg total) by mouth 2 (two) times daily. 60 tablet 2  . clopidogrel (PLAVIX) 75 MG tablet Take 1 tablet (75 mg total) by mouth daily. 30 tablet 10  . empagliflozin (JARDIANCE) 10 MG TABS tablet Take 10 mg by mouth daily. 90 tablet 3  . ezetimibe (ZETIA) 10 MG tablet Take 1 tablet (10 mg total) by mouth daily. 90 tablet 3  . famotidine (PEPCID) 20 MG tablet Take 1 tablet (20 mg total) by mouth 2 (two) times daily. 60 tablet 3  . furosemide (LASIX) 80 MG tablet Take 1 tablet (80 mg total) 2 (two) times daily by mouth. 180 tablet 3  . gabapentin (NEURONTIN) 300 MG capsule Take 3 capsules (900 mg total) by mouth 3 (three) times daily. 180 capsule 2  . insulin glargine (LANTUS) 100 UNIT/ML injection Inject 0.45 mLs (45 Units total) into the skin 2 (two) times daily. 30 mL 2  . insulin lispro (HUMALOG) 100 UNIT/ML injection Inject 0.2 mLs (20 Units total) into the skin 3 (three) times daily with meals. 20 mL 2  . levothyroxine (SYNTHROID, LEVOTHROID) 50 MCG tablet Take 1 tablet (50 mcg  total) daily before breakfast by mouth. 30 tablet 1  . metFORMIN (GLUCOPHAGE-XR) 500 MG 24 hr tablet TAKE 2 TABLETS BY MOUTH 2 TIMES DAILY WITH A MEAL. (Patient taking differently: Take 1,000 mg 2 (two) times daily by mouth. TAKE 2 TABLETS BY MOUTH 2 TIMES DAILY WITH A MEAL.) 120 tablet 11  . nitroGLYCERIN (NITROSTAT) 0.4 MG SL tablet Place 1 tablet (0.4 mg total) under the tongue every 5 (five) minutes as needed for chest pain. 30 tablet 12  . polyethylene glycol (MIRALAX) packet Take 17 g by mouth daily. 14 each 0  . Potassium Chloride ER 20 MEQ TBCR Take 40  mEq 2 (two) times daily by mouth. 180 tablet 2  . metoprolol tartrate (LOPRESSOR) 50 MG tablet Take 1 tablet (50 mg total) by mouth 2 (two) times daily. 90 tablet 11  . rosuvastatin (CRESTOR) 40 MG tablet Take 1 tablet (40 mg total) by mouth daily. 90 tablet 3  . traMADol (ULTRAM) 50 MG tablet Take 1 tablet (50 mg total) every 6 (six) hours as needed by mouth. (Patient not taking: Reported on 05/18/2017) 20 tablet 0   No current facility-administered medications on file prior to visit.     Allergies  Allergen Reactions  . Nsaids     Avoid due to CKD  . 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  Pt only wants pain medicine if absolutely necessary.  . Levofloxacin Rash    Rash on back (not sun exposed)and hypersensitivity to skin on exposed skin/arm    Social History   Socioeconomic History  . Marital status: Divorced    Spouse name: Not on file  . Number of children: Not on file  . Years of education: Not on file  . Highest education level: Not on file  Social Needs  . Financial resource strain: Not on file  . Food insecurity - worry: Not on file  . Food insecurity - inability: Not on file  . Transportation needs - medical: Not on file  . Transportation needs - non-medical: Not on file  Occupational History  . Occupation: Company secretary  Tobacco Use  . Smoking status: Former Games developer  . Smokeless tobacco: Never Used  . Tobacco comment: quit 01/2017  Substance and Sexual Activity  . Alcohol use: No    Alcohol/week: 0.0 oz  . Drug use: No  . Sexual activity: Not on file  Other Topics Concern  . Not on file  Social History Narrative   Adopted. Very little info on biological parents, ?diabetes.   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 Diabetes Maternal Grandmother     Past Surgical History:  Procedure Laterality Date  . APPENDECTOMY    . CARDIAC CATHETERIZATION N/A 09/16/2015    Procedure: Left Heart Cath and Coronary Angiography;  Surgeon: Runell Gess, MD;  Location: [facility] INVASIVE CV LAB;  Service: Cardiovascular;  Laterality: N/A;  . CARDIAC CATHETERIZATION N/A 09/16/2015   Procedure: Coronary Stent Intervention;  Surgeon: Runell Gess, MD;  Location: [facility] INVASIVE CV LAB;  Service: Cardiovascular;  Laterality: N/A;  . CORONARY STENT PLACEMENT      ROS: Review of Systems Neg except as above  PHYSICAL EXAM: BP 105/60 (BP Location: Left Arm, Patient Position: Sitting, Cuff Size: Normal)   Pulse 77   Temp 98.5 F (36.9 C) (Oral)   Resp 18  Ht 5\' 6"  (1.676 m)   Wt 292 lb (132.5 kg)   SpO2 99%   BMI 47.13 kg/m   Wt Readings from Last 3 Encounters:  06/11/17 292 lb (132.5 kg)  05/18/17 (!) 301 lb 12.8 oz (136.9 kg)  05/16/17 299 lb 3.2 oz (135.7 kg)    Physical Exam  General appearance - alert, well appearing, obese male and in no distress Mental status -pt very alert Neck - supple, no significant adenopathy Chest - clear to auscultation, no wheezes, rales or rhonchi, symmetric air entry Heart - normal rate, regular rhythm, normal S1, S2, no murmurs, rubs, clicks or gallops Extremities - trace edema  Results for orders placed or performed in visit on 06/11/17  Glucose (CBG)  Result Value Ref Range   POC Glucose 205 (A) 70 - 99 mg/dl  HgB W1XA1c  Result Value Ref Range   Hemoglobin A1C 7.2     ASSESSMENT AND PLAN: 1. DM type 2 with diabetic peripheral neuropathy (HCC) At goal based on BS readings Continue current dose of Humalog, Lantus and Jardiance - Glucose (CBG) - HgB A1c - Insulin Syringes, Disposable, U-100 0.5 ML MISC; Use as directed  Dispense: 100 each; Refill: 11  2. OSA (obstructive sleep apnea) -clinically better  No charity programs for O2. May have to pay out of pocket  3. Hypothyroidism, unspecified type - TSH; Future  4. Essential hypertension At goal  5. Chronic diastolic CHF (congestive heart failure)  (HCC) -compensated Continue to limit salt intake  - Comprehensive metabolic panel; Future  Patient was given the opportunity to ask questions.  Patient verbalized understanding of the plan and was able to repeat key elements of the plan.   Orders Placed This Encounter  Procedures  . Comprehensive metabolic panel  . TSH  . Glucose (CBG)  . HgB A1c     Requested Prescriptions   Signed Prescriptions Disp Refills  . Insulin Syringes, Disposable, U-100 0.5 ML MISC 100 each 11    Sig: Use as directed    Return in about 8 weeks (around 08/06/2017).  Jonah Blueeborah Johnson, MD, FACP

## 2017-06-11 NOTE — Patient Instructions (Signed)
Please give pt appointment with Marc Walker or Marc Walker next week.  Please return to the lab next week to have blood test drawn.

## 2017-06-15 ENCOUNTER — Encounter: Payer: Self-pay | Admitting: Physician Assistant

## 2017-06-15 ENCOUNTER — Ambulatory Visit (INDEPENDENT_AMBULATORY_CARE_PROVIDER_SITE_OTHER): Payer: Self-pay | Admitting: Physician Assistant

## 2017-06-15 ENCOUNTER — Ambulatory Visit: Payer: Self-pay | Attending: Internal Medicine

## 2017-06-15 VITALS — BP 128/72 | HR 63 | Ht 66.0 in | Wt 294.8 lb

## 2017-06-15 DIAGNOSIS — E039 Hypothyroidism, unspecified: Secondary | ICD-10-CM | POA: Insufficient documentation

## 2017-06-15 DIAGNOSIS — N182 Chronic kidney disease, stage 2 (mild): Secondary | ICD-10-CM

## 2017-06-15 DIAGNOSIS — I5032 Chronic diastolic (congestive) heart failure: Secondary | ICD-10-CM

## 2017-06-15 DIAGNOSIS — N529 Male erectile dysfunction, unspecified: Secondary | ICD-10-CM

## 2017-06-15 DIAGNOSIS — I2510 Atherosclerotic heart disease of native coronary artery without angina pectoris: Secondary | ICD-10-CM

## 2017-06-15 DIAGNOSIS — I472 Ventricular tachycardia: Secondary | ICD-10-CM

## 2017-06-15 DIAGNOSIS — R000 Tachycardia, unspecified: Secondary | ICD-10-CM

## 2017-06-15 DIAGNOSIS — R9431 Abnormal electrocardiogram [ECG] [EKG]: Secondary | ICD-10-CM

## 2017-06-15 DIAGNOSIS — N179 Acute kidney failure, unspecified: Secondary | ICD-10-CM

## 2017-06-15 MED ORDER — SILDENAFIL CITRATE 50 MG PO TABS: 50.0000 mg | ORAL_TABLET | Freq: Every day | ORAL | 1 refills | Status: DC | PRN

## 2017-06-15 MED FILL — !VIAGRA 50 MG TABLET: 50 | 30 days supply | Qty: 10 | Fill #0

## 2017-06-15 NOTE — Progress Notes (Addendum)
Cardiology Office Note    Date:  06/15/2017  ID:  Marc Walker, DOB [birth year], MRN [id number] PCP:  Marc Pier, MD  Cardiologist:  Dr. Jillyn Walker (OSA)   Chief Complaint: f/u volume overload  History of Present Illness:  Marc Walker is a 46 y.o. male with history of CAD s/pprior myocardial infarction, s/p multipleprior stents placedat outside institutions with last intervention 08/2015 at [facility], CKD stage II, DM, HTN, HL, tobacco abuse, OSA, chronic diastolic HF, morbid obesity who presents for f/u. To recap history, he was previously admitted to [facility] in 08/2015 with unstable angina. LHC demonstrated 80% mid RCA stenosis treated with DES, 90% proximal LAD and 70% mid LAD stenosis involving the previous stent treated with DES, 20% RPDA in-stent restenosis treated with angioplasty. The stent in OM2 was patent. Residual disease included 90% D2 stenosis and 70% mid LCx stenosis, treated medically. Last nuclear stress test in 9/17 demonstrated mild ischemia in the distal OM or PLA branch territory, felt to be low risk, medical therapy was continued. Last echo 06/2016 was technically limited, EF 60-65%, grade 2 DD. He was recently evaluated multiple times in the ED and cardiology office for diffuse anasarca as well as diffuse joint pain, particularly in the hands. Albumin was normal. He did not respond well to outpatient diuretic titration and in fact developed worsening kidney injury. It was felt there was another process contributing. I saw him in clinic 05/13/17 and he was newly hypoxic as well. He was subsequently sent to the ED and found to be profoundly hypothyroid with probable myxedema in the setting of self-discontinuing his thyroid medication. He was initially placed on IV Lasix. This was then held as cardiology MD stated diuretics can sometimes exacerbate this issue and the recommendation was to treat the underlying hypothyroidism. He also had episode of wide complex  tachycardia and slightly prolonged QT, so agents so Zofran was stopped. Social worker was consulted to assess CPAP machine and explore options for replacement. Weight was 299 at dc then 292 at [facility] 12/14 with PCP. Last labs 11/20 showed BUN 25, Cr 1.68, K 4.1, BNP 24, Hgb 11.9.  He returns for follow-up feeling remarkably better. Swelling seems back to his baseline, is much softer, weight remains near baseline. He seems much more alert and bubbly than the first time I met him. No chest pain or dyspnea. No palpitations. Does report ED and requests rx for this.   Past Medical History:  Diagnosis Date  . Chronic diastolic CHF (congestive heart failure) ([place]) 03/09/2017   Echo 1/18: Mild LVH, EF 60-65, Gr 2 DD, mild LAE; difficult study-no obvious wall motion abnormalities with Definity  . Coronary artery disease    a. s/pprior myocardial infarction, s/p multipleprior stents placedat outside institutions with last intervention 08/2015 at [facility].  . Diabetes mellitus (Marc Walker)   . Erectile dysfunction 03/02/2017  . Heartburn   . Hyperlipidemia associated with type 2 diabetes mellitus (Marc Walker)   . Hypertension   . Hypothyroidism    a. adm with profound hypothyroidism near [facility] in 04/2017.  . MI (myocardial infarction) (Marc Walker) 07/2009   [facility] in [place], [place] for last 2, 1st one in [place], [place]; total of 6 stents, last one 02/2014   . Morbid obesity (Marc Walker) 07/06/2016  . Noncompliance with therapeutic plan    a. thyroid med -> profound hypothyroidism 04/2017.  Marland Kitchen OSA (obstructive sleep apnea) 07/06/2016   Moderate with AHI 21.8/hr by PSG 08/2012 now on CPAP  .  Prolonged QT interval 05/14/2017  . Tobacco abuse   . Wide-complex tachycardia (Marc Walker) 05/14/2017    Past Surgical History:  Procedure Laterality Date  . APPENDECTOMY    . CARDIAC CATHETERIZATION N/A 09/16/2015   Procedure: Left Heart Cath and Coronary Angiography;  Surgeon: Marc Harp, MD;  Location: [facility] CV LAB;  Service:  Cardiovascular;  Laterality: N/A;  . CARDIAC CATHETERIZATION N/A 09/16/2015   Procedure: Coronary Stent Intervention;  Surgeon: Marc Harp, MD;  Location: [place] [facility] CV LAB;  Service: Cardiovascular;  Laterality: N/A;  . CORONARY STENT PLACEMENT      Current Medications: Current Meds  Medication Sig  . aspirin 81 MG tablet Take 1 tablet (81 mg total) by mouth daily.  Marland Kitchen buPROPion (WELLBUTRIN SR) 150 MG 12 hr tablet Take 1 tablet (150 mg total) by mouth 2 (two) times daily.  . clopidogrel (PLAVIX) 75 MG tablet Take 1 tablet (75 mg total) by mouth daily.  . empagliflozin (JARDIANCE) 10 MG TABS tablet Take 10 mg by mouth daily.  Marland Kitchen ezetimibe (ZETIA) 10 MG tablet Take 1 tablet (10 mg total) by mouth daily.  . famotidine (PEPCID) 20 MG tablet Take 1 tablet (20 mg total) by mouth 2 (two) times daily.  . furosemide (LASIX) 80 MG tablet Take 1 tablet (80 mg total) 2 (two) times daily by mouth.  . gabapentin (NEURONTIN) 300 MG capsule Take 3 capsules (900 mg total) by mouth 3 (three) times daily.  . insulin glargine (LANTUS) 100 UNIT/ML injection Inject 0.45 mLs (45 Units total) into the skin 2 (two) times daily.  . insulin lispro (HUMALOG) 100 UNIT/ML injection Inject 0.2 mLs (20 Units total) into the skin 3 (three) times daily with meals.  . Insulin Syringes, Disposable, U-100 0.5 ML MISC Use as directed  . levothyroxine (SYNTHROID, LEVOTHROID) 50 MCG tablet Take 1 tablet (50 mcg total) daily before breakfast by mouth.  . metFORMIN (GLUCOPHAGE-XR) 500 MG 24 hr tablet TAKE 2 TABLETS BY MOUTH 2 TIMES DAILY WITH A MEAL. (Patient taking differently: Take 1,000 mg 2 (two) times daily by mouth. TAKE 2 TABLETS BY MOUTH 2 TIMES DAILY WITH A MEAL.)  . nitroGLYCERIN (NITROSTAT) 0.4 MG SL tablet Place 1 tablet (0.4 mg total) under the tongue every 5 (five) minutes as needed for chest pain.  Marland Kitchen Potassium Chloride ER 20 MEQ TBCR Take 40 mEq 2 (two) times daily by mouth.  . traMADol (ULTRAM) 50 MG tablet Take  1 tablet (50 mg total) every 6 (six) hours as needed by mouth.     Allergies:   Nsaids; Other; and Levofloxacin   Social History   Socioeconomic History  . Marital status: Divorced    Spouse name: None  . Number of children: None  . Years of education: None  . Highest education level: None  Social Needs  . Financial resource strain: None  . Food insecurity - worry: None  . Food insecurity - inability: None  . Transportation needs - medical: None  . Transportation needs - non-medical: None  Occupational History  . Occupation: Biochemist, clinical  Tobacco Use  . Smoking status: Former Research scientist (life sciences)  . Smokeless tobacco: Never Used  . Tobacco comment: quit 01/2017  Substance and Sexual Activity  . Alcohol use: No    Alcohol/week: 0.0 oz  . Drug use: No  . Sexual activity: None  Other Topics Concern  . None  Social History Narrative   Adopted. Very little info on biological parents, ?diabetes.   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 Diabetes Maternal Grandmother     ROS:   Please see the history of present illness.  All other systems are reviewed and otherwise negative.    PHYSICAL EXAM:   VS:  BP 128/72   Pulse 63   Ht '5\' 6"'  (1.676 m)   Wt 294 lb 12.8 oz (133.7 kg)   BMI 47.58 kg/m   BMI: Body mass index is 47.58 kg/m. GEN: Well nourished, well developed morbidly obese WM, in no acute distress  HEENT: normocephalic, atraumatic Neck: no JVD, carotid bruits, or masses Cardiac: RRR; no murmurs, rubs, or gallops, trace bilateral LE edema superimposed on large baseline leg habitus (morbidly obese) Respiratory:  clear to auscultation bilaterally, normal work of breathing GI: soft, nontender, nondistended, + BS MS: no deformity or atrophy  Skin: warm and dry, no rash Neuro:  Alert and Oriented x 3, Strength and sensation are intact, follows commands, much more lively Psych: euthymic mood, full  affect  Wt Readings from Last 3 Encounters:  06/15/17 294 lb 12.8 oz (133.7 kg)  06/11/17 292 lb (132.5 kg)  05/18/17 (!) 301 lb 12.8 oz (136.9 kg)      Studies/Labs Reviewed:   EKG:  EKG was ordered today and personally reviewed by me and demonstrates NSR no acute ST-T changes, QTc 459m  Recent Labs: 05/06/2017: NT-Pro BNP 95 05/13/2017: B Natriuretic Peptide 24.2; Hemoglobin 11.9; Platelets 211; TSH 129.821 05/14/2017: Magnesium 2.7 05/18/2017: ALT 108; BUN 25; Creatinine, Ser 1.68; Potassium 4.1; Sodium 135   Lipid Panel    Component Value Date/Time   CHOL 104 09/18/2016 1028   TRIG 76 09/18/2016 1028   HDL 39 (L) 09/18/2016 1028   CHOLHDL 2.7 09/18/2016 1028   CHOLHDL 2.9 07/10/2016 0416   VLDL 18 07/10/2016 0416   LDLCALC 50 09/18/2016 1028    Additional studies/ records that were reviewed today include: Summarized above.    ASSESSMENT & PLAN:   1. Chronic diastolic CHF - recent volume dilemma was likely due to profound hypothyroidism, now treated. He asked about the "sodium" in levothyroxine sodium's name. I told him I am glad he is reading labels, but reassured him this is just a method of drug delivery, that we are more concerned with dietary sodium intake (20045mper day) and restricting fluid to <2L per day. He admitted to nurse [facility] also cheated at a family reunion. Overall reduction in weight has also been advised as well. 2. Acute kidney injury superimposed on reported CKD stage II - PCP checked repeat labs today, results not available yet. Would recommend primary care continue present dose of diuretics unless labs suggest worsening baseline kidney function. 3. CAD - no recent anginal symptoms. Continue present regimen. 4. Wide complex tachycardia/prolonged QT  - no further details available, listed as "3 bouts of wide complex tachycardia" in notes. He has not had any symptoms. QTc has normalized. Continue beta  blocker. 5. Erectile dysfunction - will trial sildenafil 5061m0 minutes-4 hours prior to intended sexual activity, #10. Discussed strict importance of not taking this medicine within 48 hours of NTG administration and vice versa. He verbalized understanding with teachback.  Disposition: F/u with Dr. RosHarrington Challenger 08/2017. Also has recall in 12/2017 with Dr. TurRadford Paxr OSA.   Medication Adjustments/Labs and Tests Ordered: Current medicines are reviewed at length with the patient today.  Concerns regarding medicines are outlined above. Medication changes, Labs and Tests ordered today are summarized above and listed in the Patient Instructions accessible in Encounters.   Signed, Charlie Pitter, PA-C  06/15/2017 3:44 PM    [facility] [place], [place], [place]  [address] Phone: [phone]; Fax: [phone]

## 2017-06-15 NOTE — Progress Notes (Signed)
Patient here for lab visit  

## 2017-06-15 NOTE — Patient Instructions (Signed)
Medication Instructions:  Your physician has recommended you make the following change in your medication:  1.  START Sildenafil 50 mg taking 1 tablet ONLY AS NEEDED ** MAKE SURE YOU GIVE 30 MINUTES - 4 HOURS PRIOR TO SEXUAL ACTIVITY AND DO NOT TAKE WITHIN 48 HOURS OF USING NITROGLYCERIN AND VICE VERSA   Labwork: None ordered  Testing/Procedures: None ordered  Follow-Up: Your physician recommends that you schedule a follow-up appointment in: 3 MONTHS WITH DR. ROSS   Any Other Special Instructions Will Be Listed Below (If Applicable).     If you need a refill on your cardiac medications before your next appointment, please call your pharmacy.

## 2017-06-16 ENCOUNTER — Other Ambulatory Visit: Payer: Self-pay | Admitting: Internal Medicine

## 2017-06-16 LAB — COMPREHENSIVE METABOLIC PANEL
A/G RATIO: 1.3 (ref 1.2–2.2)
ALK PHOS: 73 IU/L (ref 39–117)
ALT: 64 IU/L — ABNORMAL HIGH (ref 0–44)
AST: 32 IU/L (ref 0–40)
Albumin: 4.2 g/dL (ref 3.5–5.5)
BILIRUBIN TOTAL: 0.2 mg/dL (ref 0.0–1.2)
BUN/Creatinine Ratio: 14 (ref 9–20)
BUN: 17 mg/dL (ref 6–24)
CO2: 26 mmol/L (ref 20–29)
Calcium: 9.8 mg/dL (ref 8.7–10.2)
Chloride: 100 mmol/L (ref 96–106)
Creatinine, Ser: 1.2 mg/dL (ref 0.76–1.27)
GFR calc Af Amer: 82 mL/min/{1.73_m2} (ref >59)
GFR calc non Af Amer: 71 mL/min/{1.73_m2} (ref >59)
GLOBULIN, TOTAL: 3.2 g/dL (ref 1.5–4.5)
Glucose: 166 mg/dL — ABNORMAL HIGH (ref 65–99)
POTASSIUM: 4.5 mmol/L (ref 3.5–5.2)
SODIUM: 141 mmol/L (ref 134–144)
Total Protein: 7.4 g/dL (ref 6.0–8.5)

## 2017-06-16 LAB — TSH: TSH: 57.67 u[IU]/mL — ABNORMAL HIGH (ref 0.450–4.500)

## 2017-06-16 MED ORDER — LEVOTHYROXINE SODIUM 75 MCG PO TABS: 75.0000 ug | ORAL_TABLET | Freq: Every day | ORAL | 3 refills | Status: DC

## 2017-06-16 MED FILL — ?LEVOTHYROXINE 75 MCG TABLE: 75 | 30 days supply | Qty: 30 | Fill #0

## 2017-06-16 MED FILL — TRUEPLUS PEN NDL 31GX3/16: 31G X 5 MM | 30 days supply | Qty: 100 | Fill #4

## 2017-06-16 MED FILL — ?BUPROPION HCL SR 150MG TAB: 150 | 30 days supply | Qty: 60 | Fill #2

## 2017-06-16 MED FILL — TRUEPLUS PEN NDL 31GX3/16": 31G X 5 MM | 30 days supply | Qty: 100 | Fill #4

## 2017-06-16 MED FILL — ?CLOPIDOGREL 75MG TAB: 75 | 30 days supply | Qty: 30 | Fill #6

## 2017-06-16 MED FILL — ?METOPROLOL 50 MG TABLET: 50 | 30 days supply | Qty: 60 | Fill #6

## 2017-06-16 MED FILL — METFORMIN HCL ER 500 MG TAB: 500 | 30 days supply | Qty: 120 | Fill #3

## 2017-06-16 MED FILL — POTASSIUM CL ER 20 MEQ TAB: 20 | 30 days supply | Qty: 120 | Fill #0

## 2017-06-16 MED FILL — ROSUVASTATIN CALCIUM 40 MG: 40 | 30 days supply | Qty: 30 | Fill #6

## 2017-06-16 NOTE — Progress Notes (Signed)
Levothyroxine increase.

## 2017-08-02 ENCOUNTER — Other Ambulatory Visit: Payer: Self-pay | Admitting: Internal Medicine

## 2017-08-02 MED FILL — !HUMALOG 100 UNITS/ML KWIKP: 100 | 25 days supply | Qty: 15 | Fill #4

## 2017-08-02 MED FILL — POTASSIUM CL ER 20 MEQ TAB: 20 | 30 days supply | Qty: 120 | Fill #1

## 2017-08-02 MED FILL — ROSUVASTATIN CALCIUM 40 MG: 40 | 30 days supply | Qty: 30 | Fill #7

## 2017-08-02 MED FILL — TRUEplus LANCETS 28G MISC: 30 days supply | Qty: 100 | Fill #6

## 2017-08-02 MED FILL — ?LEVOTHYROXINE 75 MCG TABLE: 75 | 30 days supply | Qty: 30 | Fill #1

## 2017-08-02 MED FILL — $LANTUS 100 UNITS/ML VIAL: 100 | 33 days supply | Qty: 30 | Fill #1

## 2017-08-02 MED FILL — ?CLOPIDOGREL 75MG TABLET: 75 | 30 days supply | Qty: 30 | Fill #7

## 2017-08-02 MED FILL — !VIAGRA 50 MG TABLET: 50 | 30 days supply | Qty: 10 | Fill #1

## 2017-08-02 MED FILL — ?METOPROLOL TARTRTE 50MG TA: 50 | 30 days supply | Qty: 60 | Fill #7

## 2017-08-02 MED FILL — GABAPENTIN 300 MG CAPSULE: 300 | 60 days supply | Qty: 180 | Fill #0

## 2017-08-02 MED FILL — TRUE METRIX TEST STRIP: 30 days supply | Qty: 100 | Fill #2

## 2017-08-02 MED FILL — METFORMIN HCL ER 500 MG TAB: 500 | 30 days supply | Qty: 120 | Fill #4

## 2017-08-02 MED FILL — ?FUROSEMIDE 80MG TABLET: 80 | 30 days supply | Qty: 60 | Fill #2

## 2017-08-02 MED FILL — BUPROPION SR 150 MG TABLET: 150 | 30 days supply | Qty: 60 | Fill #0

## 2017-08-09 ENCOUNTER — Ambulatory Visit: Payer: Self-pay | Attending: Internal Medicine | Admitting: Internal Medicine

## 2017-08-09 ENCOUNTER — Encounter: Payer: Self-pay | Admitting: Internal Medicine

## 2017-08-09 VITALS — BP 110/72 | HR 66 | Temp 97.9°F | Ht 66.0 in | Wt 288.4 lb

## 2017-08-09 DIAGNOSIS — N182 Chronic kidney disease, stage 2 (mild): Secondary | ICD-10-CM | POA: Insufficient documentation

## 2017-08-09 DIAGNOSIS — I5032 Chronic diastolic (congestive) heart failure: Secondary | ICD-10-CM | POA: Insufficient documentation

## 2017-08-09 DIAGNOSIS — Z7902 Long term (current) use of antithrombotics/antiplatelets: Secondary | ICD-10-CM | POA: Insufficient documentation

## 2017-08-09 DIAGNOSIS — Z9989 Dependence on other enabling machines and devices: Secondary | ICD-10-CM

## 2017-08-09 DIAGNOSIS — Z79899 Other long term (current) drug therapy: Secondary | ICD-10-CM | POA: Insufficient documentation

## 2017-08-09 DIAGNOSIS — G4733 Obstructive sleep apnea (adult) (pediatric): Secondary | ICD-10-CM | POA: Insufficient documentation

## 2017-08-09 DIAGNOSIS — F329 Major depressive disorder, single episode, unspecified: Secondary | ICD-10-CM | POA: Insufficient documentation

## 2017-08-09 DIAGNOSIS — E1142 Type 2 diabetes mellitus with diabetic polyneuropathy: Secondary | ICD-10-CM | POA: Insufficient documentation

## 2017-08-09 DIAGNOSIS — K219 Gastro-esophageal reflux disease without esophagitis: Secondary | ICD-10-CM | POA: Insufficient documentation

## 2017-08-09 DIAGNOSIS — I2510 Atherosclerotic heart disease of native coronary artery without angina pectoris: Secondary | ICD-10-CM | POA: Insufficient documentation

## 2017-08-09 DIAGNOSIS — E039 Hypothyroidism, unspecified: Secondary | ICD-10-CM | POA: Insufficient documentation

## 2017-08-09 DIAGNOSIS — Z886 Allergy status to analgesic agent status: Secondary | ICD-10-CM | POA: Insufficient documentation

## 2017-08-09 DIAGNOSIS — Z833 Family history of diabetes mellitus: Secondary | ICD-10-CM | POA: Insufficient documentation

## 2017-08-09 DIAGNOSIS — M79643 Pain in unspecified hand: Secondary | ICD-10-CM

## 2017-08-09 DIAGNOSIS — Z794 Long term (current) use of insulin: Secondary | ICD-10-CM | POA: Insufficient documentation

## 2017-08-09 DIAGNOSIS — N400 Enlarged prostate without lower urinary tract symptoms: Secondary | ICD-10-CM | POA: Insufficient documentation

## 2017-08-09 DIAGNOSIS — E1122 Type 2 diabetes mellitus with diabetic chronic kidney disease: Secondary | ICD-10-CM | POA: Insufficient documentation

## 2017-08-09 DIAGNOSIS — N529 Male erectile dysfunction, unspecified: Secondary | ICD-10-CM | POA: Insufficient documentation

## 2017-08-09 DIAGNOSIS — Z9889 Other specified postprocedural states: Secondary | ICD-10-CM | POA: Insufficient documentation

## 2017-08-09 DIAGNOSIS — Z87891 Personal history of nicotine dependence: Secondary | ICD-10-CM | POA: Insufficient documentation

## 2017-08-09 DIAGNOSIS — G8929 Other chronic pain: Secondary | ICD-10-CM

## 2017-08-09 DIAGNOSIS — I130 Hypertensive heart and chronic kidney disease with heart failure and stage 1 through stage 4 chronic kidney disease, or unspecified chronic kidney disease: Secondary | ICD-10-CM | POA: Insufficient documentation

## 2017-08-09 DIAGNOSIS — Z7982 Long term (current) use of aspirin: Secondary | ICD-10-CM | POA: Insufficient documentation

## 2017-08-09 DIAGNOSIS — Z9861 Coronary angioplasty status: Secondary | ICD-10-CM

## 2017-08-09 LAB — GLUCOSE, POCT (MANUAL RESULT ENTRY): POC GLUCOSE: 157 mg/dL — AB (ref 70–99)

## 2017-08-09 MED ORDER — INSULIN ASPART 100 UNIT/ML ~~LOC~~ SOLN: 20.0000 [IU] | Freq: Three times a day (TID) | SUBCUTANEOUS | 11 refills | Status: DC

## 2017-08-09 NOTE — Progress Notes (Signed)
Patient ID: Schyler Counsell, male    DOB: 04-09-1971  MRN: [id number]  CC: Follow-up and Diabetes   Subjective: Deavion Dobbs is a 47 y.o. male who presents for chronic ds management.  Last seen 06/11/2017 His concerns today include:  47 year old male with history of CAD (8stents), DM type II, HL, HTN, obesity, OSA on CPAP, tobacco dependence, BPH, hypothyroid.   1. OSA:  Sleeping well. Mask that he got from the hosp fits better and gives better seal than the one from medical supplier -using CPAP consistently every night -no problems falling asleep in church any more!!  2.  Hypothyroidism:  He did get my message about inc dose of Levothyroxine to 75 mg and has been taking -still gets some numbness, stiffness and slight swelling in hands  3.  DM:  Checking BS 2-3 x a day.  Range 80-130.  No lows -eats frozen meals at times and occasional Ginger Ale soda Compliant with Lantus and Humalog.  Has about 4 pens of Humalog left. Would like to switch to Novolog viles.  Gets Lantus viles and feels having both in viles would be more convenient for him  4.  Chronic CHF/CAD:  No CP.  Some SOB if he exerts too much wgh down 6 lbs since last visit.  + edema LE off and on  5.  Depression on chart:  "I'm not depress.  I like me. Take that off of there."  Patient Active Problem List   Diagnosis Date Noted  . Prolonged QT interval 05/14/2017  . Wide-complex tachycardia (HCC) 05/14/2017  . Acute respiratory failure with hypoxia (HCC) 05/13/2017  . GERD (gastroesophageal reflux disease) 05/13/2017  . Acute renal failure superimposed on stage 2 chronic kidney disease (HCC) 05/13/2017  . Hypothyroidism   . Bilateral carpal tunnel syndrome 04/08/2017  . Acute on chronic diastolic CHF (congestive heart failure) (HCC) 03/09/2017  . Erectile dysfunction 03/02/2017  . OSA on CPAP 07/06/2016  . Morbid obesity (HCC) 07/06/2016  . DM type 2 with diabetic peripheral neuropathy (HCC) 12/16/2015  . CAD  (coronary artery disease) 09/30/2015  . Essential hypertension 09/16/2015  . Hyperlipidemia   . History of MI (myocardial infarction) 07/30/2009     Current Outpatient Medications on File Prior to Visit  Medication Sig Dispense Refill  . aspirin 81 MG tablet Take 1 tablet (81 mg total) by mouth daily. 90 tablet 3  . buPROPion (WELLBUTRIN SR) 150 MG 12 hr tablet TAKE 1 TABLET (150 MG TOTAL) BY MOUTH 2 (TWO) TIMES DAILY. 60 tablet 0  . clopidogrel (PLAVIX) 75 MG tablet Take 1 tablet (75 mg total) by mouth daily. 30 tablet 10  . empagliflozin (JARDIANCE) 10 MG TABS tablet Take 10 mg by mouth daily. 90 tablet 3  . ezetimibe (ZETIA) 10 MG tablet Take 1 tablet (10 mg total) by mouth daily. 90 tablet 3  . furosemide (LASIX) 80 MG tablet Take 1 tablet (80 mg total) 2 (two) times daily by mouth. 180 tablet 3  . gabapentin (NEURONTIN) 300 MG capsule TAKE 3 CAPSULES BY MOUTH 3 TIMES DAILY. 180 capsule 0  . insulin glargine (LANTUS) 100 UNIT/ML injection Inject 0.45 mLs (45 Units total) into the skin 2 (two) times daily. 30 mL 2  . Insulin Syringes, Disposable, U-100 0.5 ML MISC Use as directed 100 each 11  . levothyroxine (SYNTHROID, LEVOTHROID) 75 MCG tablet Take 1 tablet (75 mcg total) by mouth daily. 90 tablet 3  . metFORMIN (GLUCOPHAGE-XR) 500 MG 24 hr tablet TAKE  2 TABLETS BY MOUTH 2 TIMES DAILY WITH A MEAL. (Patient taking differently: Take 1,000 mg 2 (two) times daily by mouth. TAKE 2 TABLETS BY MOUTH 2 TIMES DAILY WITH A MEAL.) 120 tablet 11  . metoprolol tartrate (LOPRESSOR) 50 MG tablet Take 1 tablet (50 mg total) by mouth 2 (two) times daily. 90 tablet 11  . nitroGLYCERIN (NITROSTAT) 0.4 MG SL tablet Place 1 tablet (0.4 mg total) under the tongue every 5 (five) minutes as needed for chest pain. 30 tablet 12  . Potassium Chloride ER 20 MEQ TBCR Take 40 mEq 2 (two) times daily by mouth. 180 tablet 2  . rosuvastatin (CRESTOR) 40 MG tablet Take 1 tablet (40 mg total) by mouth daily. 90 tablet 3  .  sildenafil (VIAGRA) 50 MG tablet Take 1 tablet (50 mg total) by mouth daily as needed for erectile dysfunction. 10 tablet 1  . famotidine (PEPCID) 20 MG tablet Take 1 tablet (20 mg total) by mouth 2 (two) times daily. (Patient not taking: Reported on 08/09/2017) 60 tablet 3   No current facility-administered medications on file prior to visit.     Allergies  Allergen Reactions  . Nsaids     Avoid due to CKD  . 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  Pt only wants pain medicine if absolutely necessary.  . Levofloxacin Rash    Rash on back (not sun exposed)and hypersensitivity to skin on exposed skin/arm    Social History   Socioeconomic History  . Marital status: Divorced    Spouse name: Not on file  . Number of children: Not on file  . Years of education: Not on file  . Highest education level: Not on file  Social Needs  . Financial resource strain: Not on file  . Food insecurity - worry: Not on file  . Food insecurity - inability: Not on file  . Transportation needs - medical: Not on file  . Transportation needs - non-medical: Not on file  Occupational History  . Occupation: Company secretaryWarehouse worker  Tobacco Use  . Smoking status: Former Games developermoker  . Smokeless tobacco: Never Used  . Tobacco comment: quit 01/2017  Substance and Sexual Activity  . Alcohol use: No    Alcohol/week: 0.0 oz  . Drug use: No  . Sexual activity: Not on file  Other Topics Concern  . Not on file  Social History Narrative   Adopted. Very little info on biological parents, ?diabetes.   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 Diabetes Maternal Grandmother     Past Surgical History:  Procedure Laterality Date  . APPENDECTOMY    . CARDIAC CATHETERIZATION N/A 09/16/2015   Procedure: Left Heart Cath and Coronary Angiography;  Surgeon: Runell GessJonathan J Berry, MD;  [facility];  Service: Cardiovascular;   Laterality: N/A;  . CARDIAC CATHETERIZATION N/A 09/16/2015   Procedure: Coronary Stent Intervention;  Surgeon: Runell GessJonathan J Berry, MD;  Location: [facility];  Service: Cardiovascular;  Laterality: N/A;  . CORONARY STENT PLACEMENT      ROS: Review of Systems As above  PHYSICAL EXAM: BP 110/72 (BP Location: Right Arm, Patient Position: Sitting, Cuff Size: Large)   Pulse 66   Temp 97.9 F (36.6 C) (Oral)   Ht 5\' 6"  (1.676 m)   Wt 288 lb 6.4 oz (130.8 kg)   SpO2 96%  BMI 46.55 kg/m   Wt Readings from Last 3 Encounters:  08/09/17 288 lb 6.4 oz (130.8 kg)  06/15/17 294 lb 12.8 oz (133.7 kg)  06/11/17 292 lb (132.5 kg)   Physical Exam General appearance - alert, well appearing, and in no distress Mental status - alert, oriented to person, place, and time, normal mood, behavior, speech, dress, motor activity, and thought processes Mouth - mucous membranes moist, pharynx normal without lesions Neck - supple, no significant adenopathy Chest - clear to auscultation, no wheezes, rales or rhonchi, symmetric air entry Heart - normal rate, regular rhythm, normal S1, S2, no murmurs, rubs, clicks or gallops Extremities -no LE edema MSK: hands: slight edema.  Has problems opposing the thumb with the fifth finger on both hands.  No signs of active inflammation  Lab Results  Component Value Date   TSH 57.670 (H) 06/15/2017   Lab Results  Component Value Date   WBC 12.6 (H) 05/13/2017   HGB 11.9 (L) 05/13/2017   HCT 36.0 (L) 05/13/2017   MCV 95.0 05/13/2017   PLT 211 05/13/2017     Chemistry      Component Value Date/Time   NA 141 06/15/2017 1438   K 4.5 06/15/2017 1438   CL 100 06/15/2017 1438   CO2 26 06/15/2017 1438   BUN 17 06/15/2017 1438   CREATININE 1.20 06/15/2017 1438   CREATININE 0.70 06/15/2016 1037      Component Value Date/Time   CALCIUM 9.8 06/15/2017 1438   ALKPHOS 73 06/15/2017 1438   AST 32 06/15/2017 1438   ALT 64 (H) 06/15/2017 1438   BILITOT 0.2  06/15/2017 1438      Results for orders placed or performed in visit on 08/09/17  POCT glucose (manual entry)  Result Value Ref Range   POC Glucose 157 (A) 70 - 99 mg/dl   W0J:  6.9  ASSESSMENT AND PLAN: 1. DM type 2 with diabetic peripheral neuropathy (HCC) -Controlled. He will finish off the Humalog pens that he has at home.  Afterwards he will start the NovoLog vials. Continue to encourage healthy eating habits - POCT glucose (manual entry) - Microalbumin / creatinine urine ratio - insulin aspart (NOVOLOG) 100 UNIT/ML injection; Inject 20 Units into the skin 3 (three) times daily before meals.  Dispense: 30 mL; Refill: 11  2. OSA on CPAP -Doing well.  Continue compliance with CPAP  3. Hypothyroidism, unspecified type -He has been on the 75 mg dose of levothyroxine for over a month.  We will check TSH today - TSH  4. Chronic diastolic CHF (congestive heart failure) (HCC) 5. CAD S/P multiple PCI's -Doing well. Continue to limit salt in the foods Continue Crestor, metoprolol, Plavix and aspirin, and furosemide  6. Chronic hand pain, unspecified laterality -I think a combo of peripheral neuropathy and hypothyroidism that we are working on getting under control.  Also needs to rule out arthritis - DG Hand Complete Left; Future - DG Hand Complete Right; Future  7.  Depression taken off of problem list  Patient was given the opportunity to ask questions.  Patient verbalized understanding of the plan and was able to repeat key elements of the plan.   Orders Placed This Encounter  Procedures  . DG Hand Complete Left  . DG Hand Complete Right  . Microalbumin / creatinine urine ratio  . TSH  . POCT glucose (manual entry)     Requested Prescriptions   Signed Prescriptions Disp Refills  . insulin aspart (NOVOLOG) 100 UNIT/ML  injection 30 mL 11    Sig: Inject 20 Units into the skin 3 (three) times daily before meals.    Return in about 3 months (around  11/06/2017).  Jonah Blue, MD, FACP

## 2017-08-09 NOTE — Patient Instructions (Signed)
Try to avoid eating too much frozen dinners as these are sometimes high in salt.   [facility] of your hands.

## 2017-08-10 ENCOUNTER — Other Ambulatory Visit: Payer: Self-pay | Admitting: Internal Medicine

## 2017-08-10 ENCOUNTER — Encounter: Payer: Self-pay | Admitting: Internal Medicine

## 2017-08-10 ENCOUNTER — Ambulatory Visit (HOSPITAL_COMMUNITY)
Admission: RE | Admit: 2017-08-10 | Discharge: 2017-08-10 | Disposition: A | Discharge status: Home / Self Care | Payer: Self-pay | Source: Ambulatory Visit | Attending: Internal Medicine | Admitting: Internal Medicine

## 2017-08-10 ENCOUNTER — Telehealth: Payer: Self-pay | Admitting: Internal Medicine

## 2017-08-10 DIAGNOSIS — G8929 Other chronic pain: Secondary | ICD-10-CM | POA: Insufficient documentation

## 2017-08-10 DIAGNOSIS — M79643 Pain in unspecified hand: Secondary | ICD-10-CM | POA: Insufficient documentation

## 2017-08-10 DIAGNOSIS — E039 Hypothyroidism, unspecified: Secondary | ICD-10-CM

## 2017-08-10 LAB — TSH: TSH: 70.23 u[IU]/mL — ABNORMAL HIGH (ref 0.450–4.500)

## 2017-08-10 LAB — MICROALBUMIN / CREATININE URINE RATIO
Creatinine, Urine: 75.9 mg/dL
MICROALB/CREAT RATIO: 77.9 mg/g{creat} — AB (ref 0.0–30.0)
Microalbumin, Urine: 59.1 ug/mL

## 2017-08-10 IMAGING — CR DG LUMBAR SPINE COMPLETE 4+V
5 series · 5 of 5 positions shown · non-contrast
Comparison: None.

CLINICAL DATA: Lower back pain with buttock paresthesias,
intermittently over the past several weeks with no known trauma.

EXAM:
LUMBAR SPINE - COMPLETE 4+ VIEW

[t l-spine a.p.]
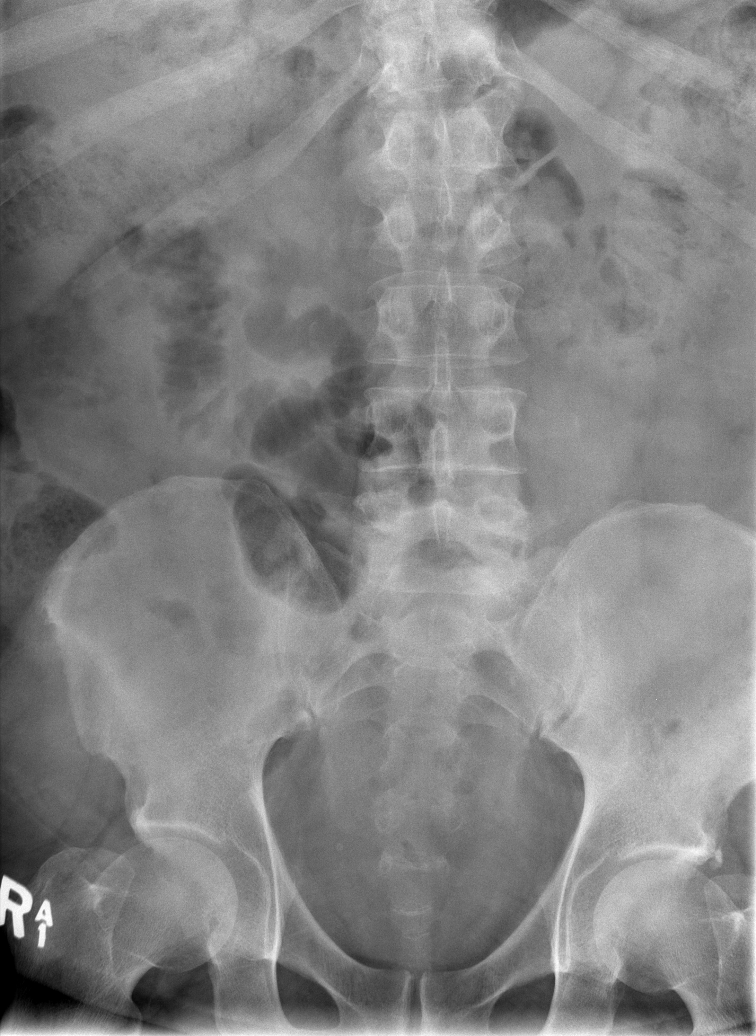

[t l-spine oblique exposure (1 of 2)]
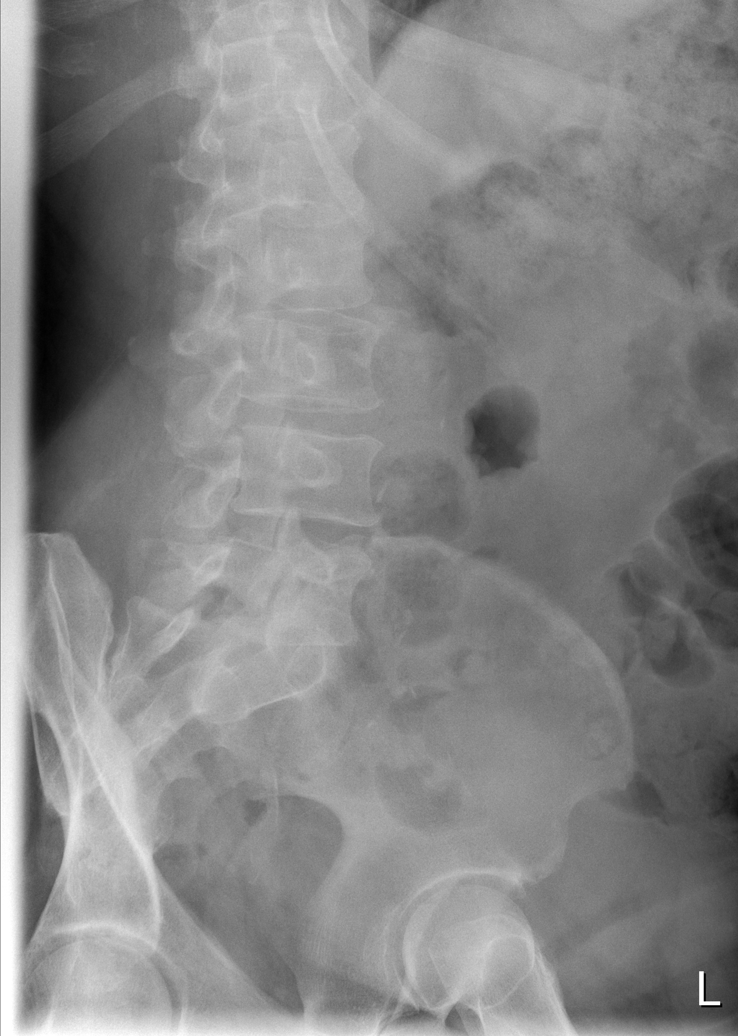

[t l-spine oblique exposure (2 of 2)]
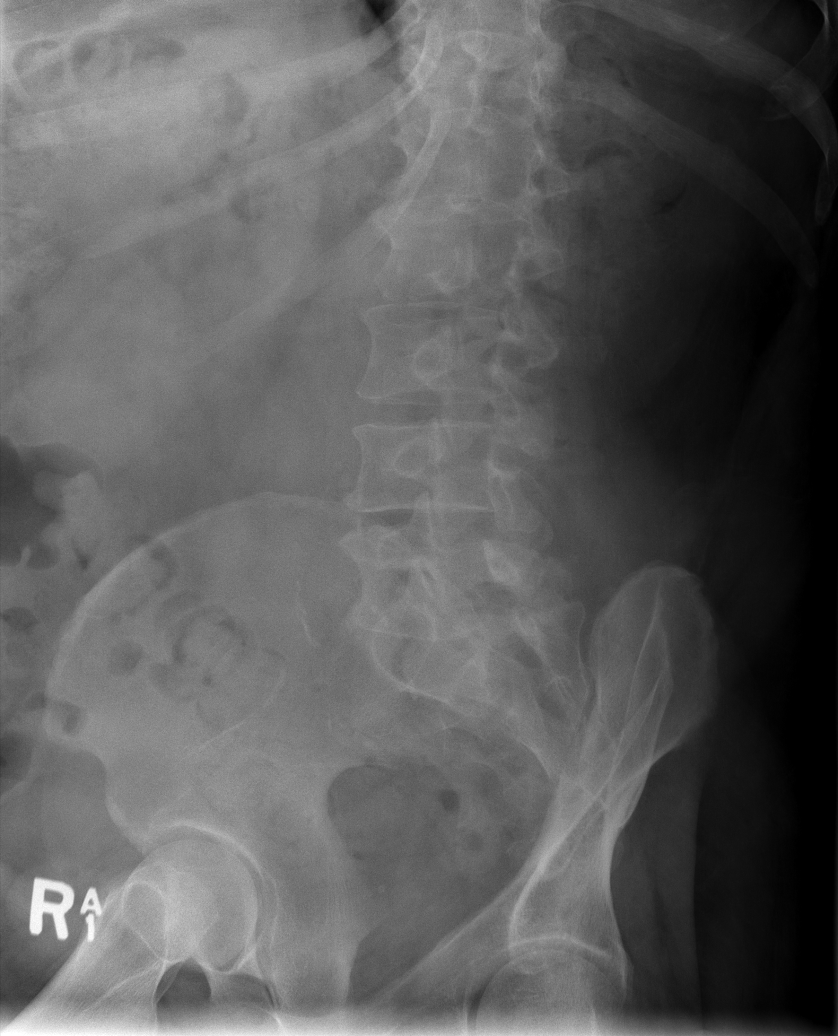

[t l-spine lat]
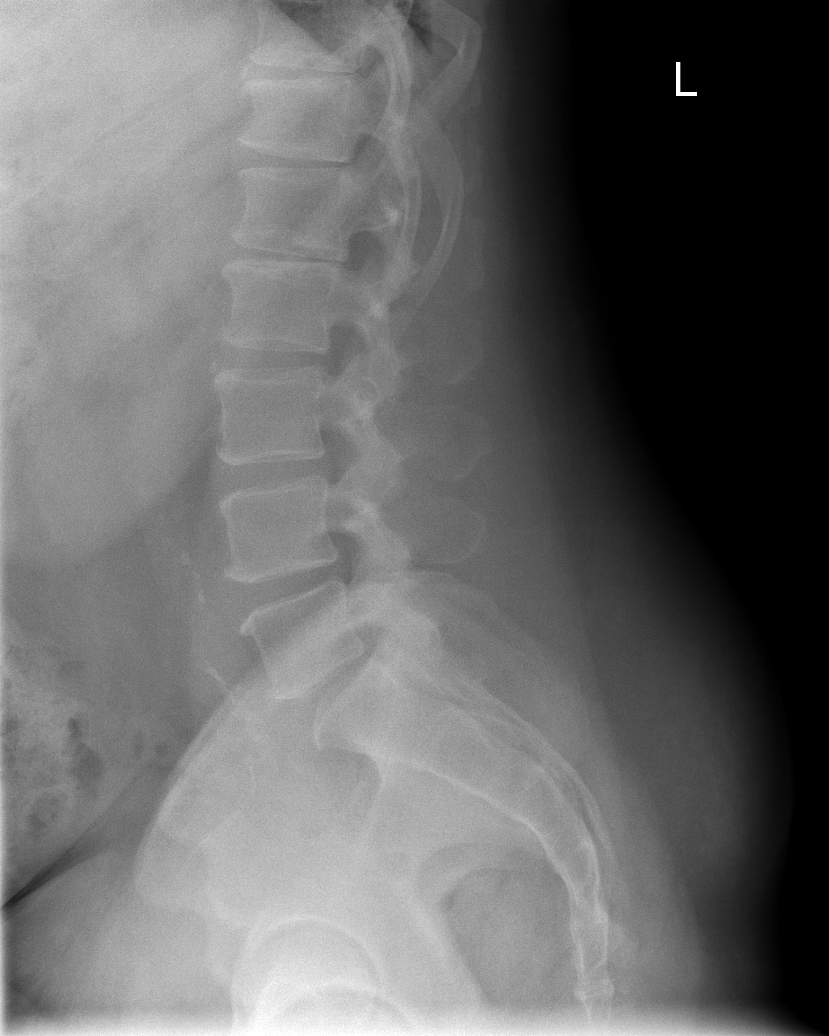

[t l-spine l5-s1 spot]
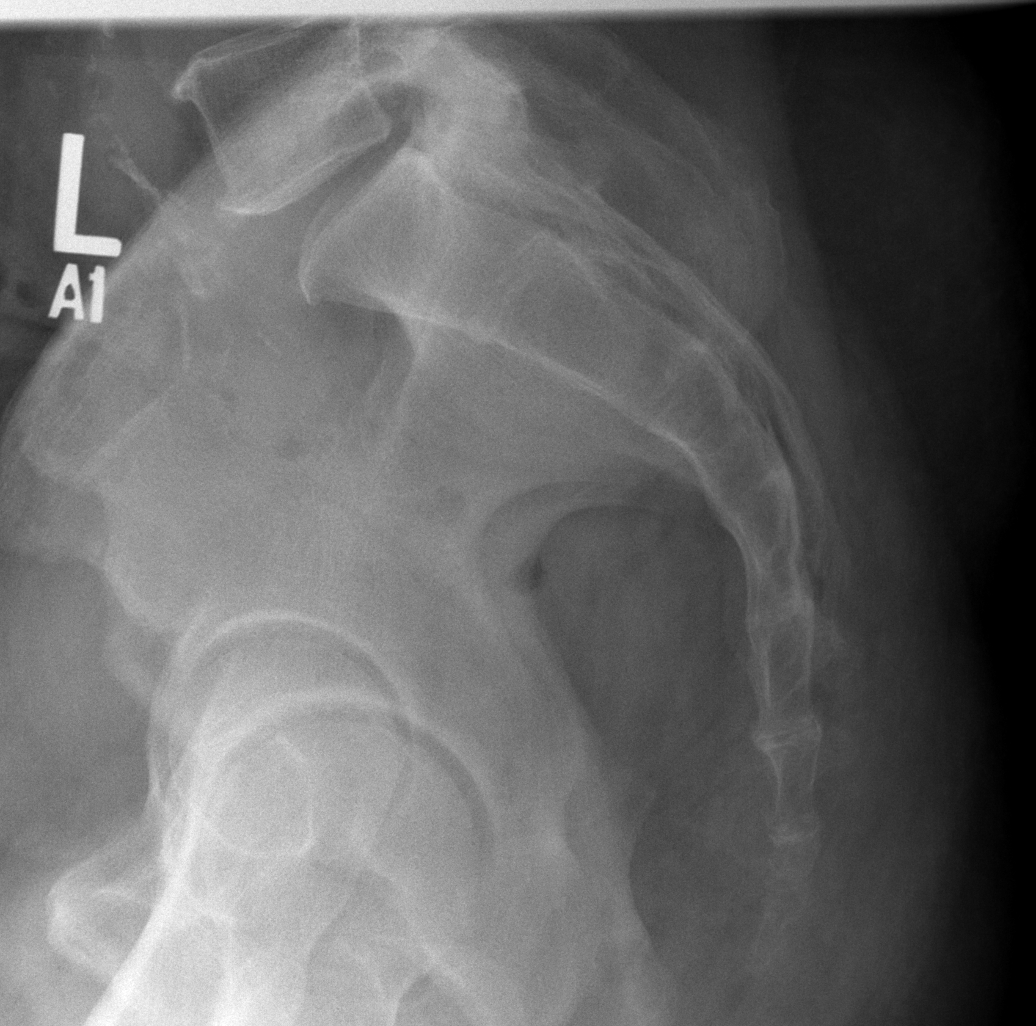

[5 of 5 positions shown; findings below may reference images not displayed]

FINDINGS: The lumbar vertebrae are normal in height. Mild degenerative disc
changes are present at L1-2, also at T11-12 and T12-L1. There is
minimal retrolisthesis at L2-3 which is probably degenerative. Facet
articulations appear intact. No spondylolysis.

Sacroiliac joints are unremarkable.

No bone lesion or bony destruction
IMPRESSION: Mild degenerative disc changes. Mild degenerative appearing
retrolisthesis at L2-3.

## 2017-08-10 MED ORDER — LEVOTHYROXINE SODIUM 100 MCG PO TABS
100.0000 ug | ORAL_TABLET | Freq: Every day | ORAL | 2 refills | Status: DC
Start: 2017-08-10 — End: 2017-09-21

## 2017-08-10 MED FILL — ?LEVOTHYROXINE 100 MCG TAB: 100 | 30 days supply | Qty: 30 | Fill #0

## 2017-08-10 NOTE — Telephone Encounter (Signed)
PC placed to pt this a.m to discuss elevated thyroid level.  I left a message on his VM letting him know that I was sending him a Mychart message and to please take a look at it.

## 2017-08-17 ENCOUNTER — Encounter: Payer: Self-pay | Admitting: Internal Medicine

## 2017-08-18 ENCOUNTER — Ambulatory Visit: Payer: Self-pay | Attending: Internal Medicine

## 2017-08-18 ENCOUNTER — Encounter: Payer: Self-pay | Admitting: Internal Medicine

## 2017-08-18 ENCOUNTER — Telehealth: Payer: Self-pay | Admitting: Internal Medicine

## 2017-08-18 NOTE — Telephone Encounter (Signed)
I sent pt a Mychart message requesting more information about why he needs the letter.

## 2017-08-18 NOTE — Telephone Encounter (Signed)
Pt came in to request a note that he is unable to work for [employer]. Please follow up

## 2017-08-20 ENCOUNTER — Encounter: Payer: Self-pay | Admitting: Internal Medicine

## 2017-08-20 NOTE — Progress Notes (Signed)
Letter written

## 2017-08-27 ENCOUNTER — Ambulatory Visit (INDEPENDENT_AMBULATORY_CARE_PROVIDER_SITE_OTHER): Payer: Self-pay | Admitting: Internal Medicine

## 2017-08-27 ENCOUNTER — Encounter: Payer: Self-pay | Admitting: Internal Medicine

## 2017-08-27 VITALS — BP 102/68 | HR 71 | Ht 66.0 in | Wt 290.0 lb

## 2017-08-27 DIAGNOSIS — I5032 Chronic diastolic (congestive) heart failure: Secondary | ICD-10-CM

## 2017-08-27 DIAGNOSIS — I472 Ventricular tachycardia: Secondary | ICD-10-CM

## 2017-08-27 DIAGNOSIS — N179 Acute kidney failure, unspecified: Secondary | ICD-10-CM

## 2017-08-27 DIAGNOSIS — I2510 Atherosclerotic heart disease of native coronary artery without angina pectoris: Secondary | ICD-10-CM

## 2017-08-27 DIAGNOSIS — R000 Tachycardia, unspecified: Secondary | ICD-10-CM

## 2017-08-27 DIAGNOSIS — I10 Essential (primary) hypertension: Secondary | ICD-10-CM

## 2017-08-27 DIAGNOSIS — E039 Hypothyroidism, unspecified: Secondary | ICD-10-CM

## 2017-08-27 MED ORDER — SILDENAFIL CITRATE 50 MG PO TABS: 50.0000 mg | ORAL_TABLET | Freq: Every day | ORAL | 1 refills | Status: DC | PRN

## 2017-08-27 MED FILL — !VIAGRA 50 MG TABLET: 50 MG | 30 days supply | Qty: 10 | Fill #0

## 2017-08-27 NOTE — Progress Notes (Signed)
Cardiology Office Note    Date:  08/27/2017  ID:  Marc Walker, DOB 1971/01/27, MRN [id number] PCP:  Marc MatarJohnson, Marc B, MD  Cardiologist:  Dr. Arliss Journeyoss/Turner ([facility])   Pt presents for f/u of CAD and edema  History of Present Illness:  Marc Walker is a 47 y.o. male with history of KM, HTN, HL, tob abuse, Chronic diastlic CHF, OSA,  CAD s/pprior myocardial infarction, s/p multipleprior stents  with last intervention 08/2015 (90% prox LAD; 70% mid LAD_(in stent:, 90% D2; 705 mid Lc; 80% mid RCA; OM2 stent patent. Last nuclear stress test in 9/17 demonstrated mild ischemia in the distal OM or PLA branch territory, felt to be low risk. Last echo 06/2016 was technically limited, EF 60-65%, grade 2 DD.  Pt was seen last year mult times for edema/anasarca.  Seen by D Dunn ie [id number]  Sent to ED because hypoxic  Admitted   Found to be myxedematous  He had stopped his thyroid meds. He was initially placed on IV Lasix. This was then held as cardiology MD stated diuretics can sometimes exacerbate this issue and the recommendation was to treat the underlying hypothyroidism. He also had episode of wide complex tachycardia and slightly prolonged QT, so agents so Zofran was stopped. Social worker was consulted to assess CPAP machine  Seen in f/u by D Dunn  Feeling better at time    TSH from 3 month ago 130.  2 months ago 58.  3 wks ago 3170    The pt denies fluttering  Says he gets SOB when he exerts impself  No CP  No presyncope   Past Medical History:  Diagnosis Date  . Chronic diastolic CHF (congestive heart failure) (HCC) 03/09/2017   Echo 1/18: Mild LVH, EF 60-65, Gr 2 DD, mild LAE; difficult study-no obvious wall motion abnormalities with Definity  . Coronary artery disease    a. s/pprior myocardial infarction, s/p multipleprior stents placedat outside institutions with [facility]  . Diabetes mellitus (HCC)   . Erectile dysfunction 03/02/2017  . Heartburn   . Hyperlipidemia  associated with type 2 diabetes mellitus (HCC)   . Hypertension   . Hypothyroidism    a. [facility].  . MI (myocardial infarction) (HCC) 07/2009   [facility] [place], [place]; total of 6 stents, last one 02/2014   . Morbid obesity (HCC) 07/06/2016  . Noncompliance with therapeutic plan    a. thyroid med -> profound hypothyroidism 04/2017.  Marland Kitchen. OSA (obstructive sleep apnea) 07/06/2016   Moderate with AHI 21.8/hr by PSG 08/2012 now on CPAP  . Prolonged QT interval 05/14/2017  . Tobacco abuse   . Wide-complex tachycardia (HCC) 05/14/2017    Past Surgical History:  Procedure Laterality Date  . APPENDECTOMY    . CARDIAC CATHETERIZATION N/A 09/16/2015   Procedure: Left Heart Cath and Coronary Angiography;  Surgeon: Runell GessJonathan J Berry, MD;  [facility];  Service: Cardiovascular;  Laterality: N/A;  . CARDIAC CATHETERIZATION N/A 09/16/2015   Procedure: Coronary Stent Intervention;  Surgeon: Runell GessJonathan J Berry, MD;  Location: [facility];  Service: Cardiovascular;  Laterality: N/A;  . CORONARY STENT PLACEMENT      Current Medications: Current Meds  Medication Sig  . aspirin 81 MG tablet Take 1 tablet (81 mg total) by mouth daily.  Marland Kitchen. buPROPion (WELLBUTRIN SR) 150 MG 12 hr tablet TAKE 1 TABLET (150  MG TOTAL) BY MOUTH 2 (TWO) TIMES DAILY.  Marland Kitchen clopidogrel (PLAVIX) 75 MG tablet Take 1 tablet (75 mg total) by mouth daily.  . empagliflozin (JARDIANCE) 10 MG TABS tablet Take 10 mg by mouth daily.  Marland Kitchen ezetimibe (ZETIA) 10 MG tablet Take 1 tablet (10 mg total) by mouth daily.  . famotidine (PEPCID) 20 MG tablet Take 1 tablet (20 mg total) by mouth 2 (two) times daily.  . furosemide (LASIX) 80 MG tablet Take 1 tablet (80 mg total) 2 (two) times daily by mouth.  . gabapentin (NEURONTIN) 300 MG capsule TAKE 3 CAPSULES BY MOUTH 3 TIMES DAILY.  Marland Kitchen insulin aspart (NOVOLOG) 100 UNIT/ML injection Inject 20  Units into the skin 3 (three) times daily before meals.  . insulin glargine (LANTUS) 100 UNIT/ML injection Inject 0.45 mLs (45 Units total) into the skin 2 (two) times daily.  . Insulin Syringes, Disposable, U-100 0.5 ML MISC Use as directed  . levothyroxine (SYNTHROID, LEVOTHROID) 100 MCG tablet Take 1 tablet (100 mcg total) by mouth daily.  . metFORMIN (GLUCOPHAGE-XR) 500 MG 24 hr tablet TAKE 2 TABLETS BY MOUTH 2 TIMES DAILY WITH A MEAL. (Patient taking differently: Take 1,000 mg 2 (two) times daily by mouth. TAKE 2 TABLETS BY MOUTH 2 TIMES DAILY WITH A MEAL.)  . metoprolol tartrate (LOPRESSOR) 50 MG tablet Take 1 tablet (50 mg total) by mouth 2 (two) times daily.  . nitroGLYCERIN (NITROSTAT) 0.4 MG SL tablet Place 1 tablet (0.4 mg total) under the tongue every 5 (five) minutes as needed for chest pain.  Marland Kitchen Potassium Chloride ER 20 MEQ TBCR Take 40 mEq 2 (two) times daily by mouth.  . sildenafil (VIAGRA) 50 MG tablet Take 1 tablet (50 mg total) by mouth daily as needed for erectile dysfunction.     Allergies:   Nsaids; Other; and Levofloxacin   Social History   Socioeconomic History  . Marital status: Divorced    Spouse name: None  . Number of children: None  . Years of education: None  . Highest education level: None  Social Needs  . Financial resource strain: None  . Food insecurity - worry: None  . Food insecurity - inability: None  . Transportation needs - medical: None  . Transportation needs - non-medical: None  Occupational History  . Occupation: Company secretary  Tobacco Use  . Smoking status: Former Games developer  . Smokeless tobacco: Never Used  . Tobacco comment: quit 01/2017  Substance and Sexual Activity  . Alcohol use: No    Alcohol/week: 0.0 oz  . Drug use: No  . Sexual activity: None  Other Topics Concern  . None  Social History Narrative   Adopted. Very little info on biological parents, ?diabetes.   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 Diabetes Maternal Grandmother     ROS:   Please see the history of present illness.  All other systems are reviewed and otherwise negative.    PHYSICAL EXAM:   VS:  BP 102/68   Pulse 71   Ht 5\' 6"  (1.676 m)   Wt 290 lb (131.5 kg)   SpO2 98%   BMI 46.81 kg/m   BMI: Body mass index is 46.81 kg/m. GEN: Morbidly obesed 47 yo n no acute distress  HEENT: normocephalic, atraumatic Neck: Neck full   Cardiac: RRR; no murmurs, rubs, or gallops, 1) bilateral LE edema  Respiratory:  clear to auscultation bilaterally, normal work of breathing GI: soft, nontender, distended, + BS MS: no deformity or atrophy  Skin: warm and dry, no rash Neuro:  Alert and Oriented x 3, Strength and sensation are intact, follows commands, much more lively Psych: euthymic mood, full affect  Wt Readings from Last 3 Encounters:  08/27/17 290 lb (131.5 kg)  08/09/17 288 lb 6.4 oz (130.8 kg)  06/15/17 294 lb 12.8 oz (133.7 kg)      Studies/Labs Reviewed:   EKG:  EKG was motordered today   Recent Labs: 05/06/2017: NT-Pro BNP 95 05/13/2017: B Natriuretic Peptide 24.2; Hemoglobin 11.9; Platelets 211 05/14/2017: Magnesium 2.7 06/15/2017: ALT 64; BUN 17; Creatinine, Ser 1.20; Potassium 4.5; Sodium 141 08/09/2017: TSH 70.230   Lipid Panel    Component Value Date/Time   CHOL 104 09/18/2016 1028   TRIG 76 09/18/2016 1028   HDL 39 (L) 09/18/2016 1028   CHOLHDL 2.7 09/18/2016 1028   CHOLHDL 2.9 07/10/2016 0416   VLDL 18 07/10/2016 0416   LDLCALC 50 09/18/2016 1028    Additional studies/ records that were reviewed today include: Summarized above.    ASSESSMENT & PLAN:   1. Chronic diastolic CHF - Volume is still up  This will not improve until TSH is much imporved.  .Conitnue current meds    2  CAD - symptoms to sugg angina   3  CKD  Will need to be followed  4  Hx WCT   Will review  Avoid drugs that prolong  QT  5  Hypothyroidism.   TSH  is going back up  ? Compliance   Will forward to primary  Pt may benefit from endocrine consult  2. Erectile dysfunction - Short course of sildenafil given  Avoid NTG around use of this drug    Disposition: F/u with Dr. Tenny Craw in 08/2017. Also has recall in 12/2017 with Dr. Mayford Knife for OSA.   Medication Adjustments/Labs and Tests Ordered: Current medicines are reviewed at length with the patient today.  Concerns regarding medicines are outlined above. Medication changes, Labs and Tests ordered today are summarized above and listed in the Patient Instructions accessible in Encounters.   Signed, Dietrich Pates, MD  08/27/2017 3:00 PM    [facility] [address], [place], [place]  [address] Phone: [phone]; Fax: [phone]

## 2017-08-27 NOTE — Patient Instructions (Addendum)
Your physician recommends that you continue on your current medications as directed. Please refer to the Current Medication list given to you today.  Your physician recommends that you schedule a follow-up appointment in: June, 2019 with Dr. Tenny Crawoss or Ronie Spiesayna Dunn, APP

## 2017-08-30 ENCOUNTER — Other Ambulatory Visit: Payer: Self-pay | Admitting: Family Medicine

## 2017-08-30 MED FILL — CLOPIDOGREL 75 MG TABLET: 75 | 30 days supply | Qty: 30 | Fill #8

## 2017-08-30 MED FILL — POTASSIUM CL ER 20 MEQ TAB: 20 | 30 days supply | Qty: 120 | Fill #2

## 2017-08-30 MED FILL — !NOVOLOG 100UNITS/ML VIAL: 100/ML | 33 days supply | Qty: 20 | Fill #0

## 2017-08-30 MED FILL — METFORMIN HCL ER 500 MG TAB: 500 | 30 days supply | Qty: 120 | Fill #5

## 2017-08-30 MED FILL — TRUEplus LANCETS 28G MISC: 30 days supply | Qty: 100 | Fill #7

## 2017-08-30 MED FILL — ROSUVASTATIN CALCIUM 40 MG: 40 | 30 days supply | Qty: 30 | Fill #8

## 2017-08-30 MED FILL — TRUE METRIX TEST STRIP: 30 days supply | Qty: 100 | Fill #3

## 2017-08-30 MED FILL — BUPROPION SR 150 MG TABLET: 150 | 30 days supply | Qty: 60 | Fill #0

## 2017-08-30 MED FILL — METOPROLOL TARTRATE 50 MG T: 50 | 30 days supply | Qty: 60 | Fill #8

## 2017-08-30 MED FILL — ?LEVOTHYROXINE 75 MCG TABLE: 75 | 30 days supply | Qty: 30 | Fill #2

## 2017-08-30 MED FILL — FUROSEMIDE 80 MG TABS: 80 | 30 days supply | Qty: 60 | Fill #3

## 2017-08-30 MED FILL — $LANTUS 100 UNITS/ML VIAL: 100 | 33 days supply | Qty: 30 | Fill #2

## 2017-09-01 ENCOUNTER — Telehealth: Payer: Self-pay | Admitting: Internal Medicine

## 2017-09-01 NOTE — Telephone Encounter (Signed)
Pt called since he went to see the specialist an they recommend to increases his Thyroid medication but he need to inform the pcp before can do anything please call  Him back

## 2017-09-01 NOTE — Telephone Encounter (Signed)
New Message     Pt c/o medication issue:  1. Name of Medication:  levothyroxine (SYNTHROID, LEVOTHROID) 100 MCG tablet Take 1 tablet (100 mcg total) by mouth daily.     2. How are you currently taking this medication (dosage and times per day)? 1 tablet  3. Are you having a reaction (difficulty breathing--STAT)? no  4. What is your medication issue? Patient states Dr Tenny Crawoss was going to speak to his PCP about increasing the dosage and he was waiting to hear back from Dr Tenny Crawoss

## 2017-09-01 NOTE — Telephone Encounter (Signed)
Spoke with patient and I let him know per Dr Charlott Rakesoss's note, she was going to forward her recommendations to his PCP. I suggested he give his PCP a call to discuss further since Dr Tenny Crawoss was out of the office today. He agreed and thanked me for the call.

## 2017-09-02 NOTE — Progress Notes (Signed)
Please refer to endocrine for close f/u of thryoid   Pt has been severely hypothyroid despite replacement Rx COncern for risk for arrhythmias

## 2017-09-03 NOTE — Telephone Encounter (Signed)
Contacted pt and left a detailed vm regarding Dr. Laural BenesJohnson response

## 2017-09-07 ENCOUNTER — Ambulatory Visit: Payer: Self-pay | Attending: Internal Medicine

## 2017-09-07 ENCOUNTER — Ambulatory Visit: Payer: Self-pay | Admitting: Sports Medicine

## 2017-09-07 DIAGNOSIS — E039 Hypothyroidism, unspecified: Secondary | ICD-10-CM | POA: Insufficient documentation

## 2017-09-07 NOTE — Progress Notes (Signed)
Patient here for lab visit only 

## 2017-09-08 ENCOUNTER — Other Ambulatory Visit: Payer: Self-pay | Admitting: Internal Medicine

## 2017-09-08 DIAGNOSIS — E039 Hypothyroidism, unspecified: Secondary | ICD-10-CM

## 2017-09-08 LAB — TSH: TSH: 25.65 u[IU]/mL — ABNORMAL HIGH (ref 0.450–4.500)

## 2017-09-08 MED ORDER — LEVOTHYROXINE SODIUM 25 MCG PO TABS: ORAL_TABLET | ORAL | 2 refills | Status: DC

## 2017-09-08 MED FILL — LEVOTHYROXINE 25 MCG TABLET: 25 | 30 days supply | Qty: 30 | Fill #0

## 2017-09-09 ENCOUNTER — Other Ambulatory Visit: Payer: Self-pay

## 2017-09-09 MED ORDER — FAMOTIDINE 20 MG PO TABS: 20.0000 mg | ORAL_TABLET | Freq: Two times a day (BID) | ORAL | 3 refills | Status: DC

## 2017-09-09 MED FILL — FAMOTIDINE 20 MG TABS: 20 | 30 days supply | Qty: 60 | Fill #0

## 2017-09-20 ENCOUNTER — Ambulatory Visit: Payer: Self-pay | Admitting: Internal Medicine

## 2017-09-21 ENCOUNTER — Ambulatory Visit (INDEPENDENT_AMBULATORY_CARE_PROVIDER_SITE_OTHER): Payer: Self-pay | Admitting: Sports Medicine

## 2017-09-21 ENCOUNTER — Encounter: Payer: Self-pay | Admitting: Sports Medicine

## 2017-09-21 DIAGNOSIS — E1142 Type 2 diabetes mellitus with diabetic polyneuropathy: Secondary | ICD-10-CM

## 2017-09-21 DIAGNOSIS — M79609 Pain in unspecified limb: Secondary | ICD-10-CM

## 2017-09-21 DIAGNOSIS — B351 Tinea unguium: Secondary | ICD-10-CM

## 2017-09-21 MED FILL — ?LEVOTHYROXINE 100 MCG TAB: 100 | 30 days supply | Qty: 30 | Fill #1

## 2017-09-21 NOTE — Progress Notes (Signed)
Subjective: Marc Walker is a 47 y.o. male patient with history of diabetes who presents to office today complaining of long, painful nails  while ambulating in shoes; unable to trim. Patient states that the glucose reading this morning was not recorded. Patient denies any new changes in medication or new problems.  Patient Active Problem List   Diagnosis Date Noted  . CAD S/P multiple PCI's 08/09/2017  . Prolonged QT interval 05/14/2017  . Wide-complex tachycardia (HCC) 05/14/2017  . GERD (gastroesophageal reflux disease) 05/13/2017  . Hypothyroidism   . Bilateral carpal tunnel syndrome 04/08/2017  . Chronic diastolic CHF (congestive heart failure) (HCC) 03/09/2017  . Erectile dysfunction 03/02/2017  . OSA on CPAP 07/06/2016  . Morbid obesity (HCC) 07/06/2016  . DM type 2 with diabetic peripheral neuropathy (HCC) 12/16/2015  . CAD (coronary artery disease) 09/30/2015  . Essential hypertension 09/16/2015  . Hyperlipidemia    Current Outpatient Medications on File Prior to Visit  Medication Sig Dispense Refill  . aspirin 81 MG tablet Take 1 tablet (81 mg total) by mouth daily. 90 tablet 3  . buPROPion (WELLBUTRIN SR) 150 MG 12 hr tablet TAKE 1 TABLET BY MOUTH 2 (TWO) TIMES DAILY. 60 tablet 2  . clopidogrel (PLAVIX) 75 MG tablet Take 1 tablet (75 mg total) by mouth daily. 30 tablet 10  . empagliflozin (JARDIANCE) 10 MG TABS tablet Take 10 mg by mouth daily. 90 tablet 3  . ezetimibe (ZETIA) 10 MG tablet Take 1 tablet (10 mg total) by mouth daily. 90 tablet 3  . famotidine (PEPCID) 20 MG tablet Take 1 tablet (20 mg total) by mouth 2 (two) times daily. 60 tablet 3  . furosemide (LASIX) 80 MG tablet TAKE 1 TABLET 2 TIMES DAILY BY MOUTH  3  . gabapentin (NEURONTIN) 300 MG capsule TAKE 3 CAPSULES BY MOUTH 3 TIMES DAILY. 180 capsule 0  . glucose blood test strip USE AS INSTRUCTED  12  . insulin glargine (LANTUS) 100 unit/mL SOPN INJECT 45 UNITS TOTAL INTO THE SKIN 2 TIMES DAILY  2  . Insulin  Syringes, Disposable, U-100 0.5 ML MISC Use as directed 100 each 11  . levothyroxine (SYNTHROID, LEVOTHROID) 75 MCG tablet TAKE 1 TABLET BY MOUTH DAILY  3  . metFORMIN (GLUCOPHAGE-XR) 500 MG 24 hr tablet TAKE 2 TABLETS BY MOUTH 2 TIMES DAILY WITH A MEAL. (Patient taking differently: Take 1,000 mg 2 (two) times daily by mouth. TAKE 2 TABLETS BY MOUTH 2 TIMES DAILY WITH A MEAL.) 120 tablet 11  . nitroGLYCERIN (NITROSTAT) 0.4 MG SL tablet Place 1 tablet (0.4 mg total) under the tongue every 5 (five) minutes as needed for chest pain. 30 tablet 12  . NOVOLOG 100 UNIT/ML injection INJECT 20 UNITS INTO THE SKIN 3  THREE  TIMES DAILY BEFORE MEALS  11  . potassium chloride SA (K-DUR,KLOR-CON) 20 MEQ tablet Take 40 mEq by mouth 2 (two) times daily.  2  . TRUEPLUS LANCETS 28G MISC See admin instructions.  12  . VIAGRA 50 MG tablet TAKE 1 TABLET  50 MG TOTAL  BY MOUTH DAILY AS NEEDED FOR ERECTILE DYSFUNCTION  1  . Potassium Chloride ER 20 MEQ TBCR Take 40 mEq 2 (two) times daily by mouth. 180 tablet 2   No current facility-administered medications on file prior to visit.    Allergies  Allergen Reactions  . Nsaids     Avoid due to CKD  . 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  Pt only wants pain medicine if absolutely necessary.  . Levofloxacin Rash    Rash on back (not sun exposed)and hypersensitivity to skin on exposed skin/arm    Recent Results (from the past 2160 hour(s))  POCT glucose (manual entry)     Status: Abnormal   Collection Time: 08/09/17  8:57 AM  Result Value Ref Range   POC Glucose 157 (A) 70 - 99 mg/dl  Microalbumin / creatinine urine ratio     Status: Abnormal   Collection Time: 08/09/17  9:29 AM  Result Value Ref Range   Creatinine, Urine 75.9 Not Estab. mg/dL   Microalbumin, Urine 16.159.1 Not Estab. ug/mL   Microalb/Creat Ratio 77.9 (H) 0.0 - 30.0 mg/g creat    Comment:                      Normal:                0.0 -  30.0                       Albuminuria:          31.0 - 300.0                      Clinical albuminuria:       >300.0   TSH     Status: Abnormal   Collection Time: 08/09/17  9:29 AM  Result Value Ref Range   TSH 70.230 (H) 0.450 - 4.500 uIU/mL  TSH     Status: Abnormal   Collection Time: 09/07/17  2:18 PM  Result Value Ref Range   TSH 25.650 (H) 0.450 - 4.500 uIU/mL    Objective: General: Patient is awake, alert, and oriented x 3 and in no acute distress.  Integument: Skin is warm, dry and supple bilateral. Nails are tender, long, thickened and  dystrophic with subungual debris, consistent with onychomycosis, 1-5 bilateral. No signs of infection. No open lesions or preulcerative lesions present bilateral. Remaining integument unremarkable.  Vasculature:  Dorsalis Pedis pulse 1/4 bilateral. Posterior Tibial pulse  1/4 bilateral.  Capillary fill time <3 sec 1-5 bilateral. Positive hair growth to the level of the digits. Temperature gradient within normal limits. + varicosities present bilateral. No edema present bilateral.   Neurology: The patient has intact sensation measured with a 5.07/10g Semmes Weinstein Monofilament at all pedal sites bilateral. Vibratory sensation diminished bilateral with tuning fork. No Babinski sign present bilateral.   Musculoskeletal: Asymptomatic pes planus pedal deformities noted bilateral. Muscular strength 5/5 in all lower extremity muscular groups bilateral without pain on range of motion . No tenderness with calf compression bilateral.  Assessment and Plan: Problem List Items Addressed This Visit    None    Visit Diagnoses    Pain due to onychomycosis of nail    -  Primary   Diabetic polyneuropathy associated with type 2 diabetes mellitus (HCC)       Relevant Medications   NOVOLOG 100 UNIT/ML injection   insulin glargine (LANTUS) 100 unit/mL SOPN     -Examined patient. -Discussed and educated patient on diabetic foot care, especially with  regards to the vascular,  neurological and musculoskeletal systems.  -Stressed the importance of good glycemic control and the detriment of not controlling glucose levels in relation to the foot. -Mechanically debrided all nails 1-5 bilateral using sterile nail nipper and filed with dremel without incident -Patient to return  in 3  months for at risk foot care -Patient advised to call the office if any problems or questions arise in the meantime.  Landis Martins, DPM

## 2017-09-28 MED FILL — CLOPIDOGREL 75 MG TABLET: 75 | 30 days supply | Qty: 30 | Fill #9

## 2017-09-28 MED FILL — METFORMIN HCL ER 500 MG TAB: 500 | 30 days supply | Qty: 120 | Fill #6

## 2017-09-28 MED FILL — POTASSIUM CL ER 20 MEQ TAB: 20 | 30 days supply | Qty: 120 | Fill #3

## 2017-09-28 MED FILL — ROSUVASTATIN CALCIUM 40 MG: 40 | 30 days supply | Qty: 30 | Fill #9

## 2017-09-28 MED FILL — !NOVOLOG 100UNITS/ML VIAL: 100/ML | 33 days supply | Qty: 20 | Fill #1

## 2017-09-28 MED FILL — BUPROPION SR 150 MG TABLET: 150 | 30 days supply | Qty: 60 | Fill #1

## 2017-09-28 MED FILL — $Viagra 50mg tablet: 50 | 30 days supply | Qty: 10 | Fill #1

## 2017-09-28 MED FILL — METOPROLOL TARTRATE 50 MG T: 50 | 30 days supply | Qty: 60 | Fill #9

## 2017-09-28 MED FILL — ?FUROSEMIDE 80MG TABLET: 80 | 30 days supply | Qty: 60 | Fill #4

## 2017-09-30 ENCOUNTER — Other Ambulatory Visit: Payer: Self-pay | Admitting: Internal Medicine

## 2017-09-30 MED FILL — $LANTUS 100 UNITS/ML VIAL: 100 | 33 days supply | Qty: 30 | Fill #0

## 2017-10-15 ENCOUNTER — Encounter (HOSPITAL_BASED_OUTPATIENT_CLINIC_OR_DEPARTMENT_OTHER): Payer: Self-pay | Admitting: *Deleted

## 2017-10-15 ENCOUNTER — Emergency Department (HOSPITAL_BASED_OUTPATIENT_CLINIC_OR_DEPARTMENT_OTHER)
Admission: EM | Admit: 2017-10-15 | Discharge: 2017-10-15 | Disposition: A | Discharge status: Home / Self Care | Payer: Self-pay | Attending: Physician Assistant | Admitting: Physician Assistant

## 2017-10-15 ENCOUNTER — Other Ambulatory Visit: Payer: Self-pay

## 2017-10-15 DIAGNOSIS — Z87891 Personal history of nicotine dependence: Secondary | ICD-10-CM | POA: Insufficient documentation

## 2017-10-15 DIAGNOSIS — E039 Hypothyroidism, unspecified: Secondary | ICD-10-CM | POA: Insufficient documentation

## 2017-10-15 DIAGNOSIS — I252 Old myocardial infarction: Secondary | ICD-10-CM | POA: Insufficient documentation

## 2017-10-15 DIAGNOSIS — I5032 Chronic diastolic (congestive) heart failure: Secondary | ICD-10-CM | POA: Insufficient documentation

## 2017-10-15 DIAGNOSIS — R51 Headache: Secondary | ICD-10-CM | POA: Insufficient documentation

## 2017-10-15 DIAGNOSIS — N189 Chronic kidney disease, unspecified: Secondary | ICD-10-CM | POA: Insufficient documentation

## 2017-10-15 DIAGNOSIS — Z7982 Long term (current) use of aspirin: Secondary | ICD-10-CM | POA: Insufficient documentation

## 2017-10-15 DIAGNOSIS — I2510 Atherosclerotic heart disease of native coronary artery without angina pectoris: Secondary | ICD-10-CM | POA: Insufficient documentation

## 2017-10-15 DIAGNOSIS — Z79899 Other long term (current) drug therapy: Secondary | ICD-10-CM | POA: Insufficient documentation

## 2017-10-15 DIAGNOSIS — I130 Hypertensive heart and chronic kidney disease with heart failure and stage 1 through stage 4 chronic kidney disease, or unspecified chronic kidney disease: Secondary | ICD-10-CM | POA: Insufficient documentation

## 2017-10-15 DIAGNOSIS — Z794 Long term (current) use of insulin: Secondary | ICD-10-CM | POA: Insufficient documentation

## 2017-10-15 DIAGNOSIS — R21 Rash and other nonspecific skin eruption: Secondary | ICD-10-CM | POA: Insufficient documentation

## 2017-10-15 DIAGNOSIS — E1122 Type 2 diabetes mellitus with diabetic chronic kidney disease: Secondary | ICD-10-CM | POA: Insufficient documentation

## 2017-10-15 DIAGNOSIS — R238 Other skin changes: Secondary | ICD-10-CM

## 2017-10-15 HISTORY — DX: Other psychoactive substance dependence, in remission: F19.21

## 2017-10-15 HISTORY — DX: Chronic kidney disease, unspecified: N18.9

## 2017-10-15 LAB — CBG MONITORING, ED: GLUCOSE-CAPILLARY: 191 mg/dL — AB (ref 65–99)

## 2017-10-15 MED ORDER — KETOCONAZOLE 2 % EX SHAM: 1.0000 "application " | MEDICATED_SHAMPOO | CUTANEOUS | 0 refills | Status: DC

## 2017-10-15 NOTE — Discharge Instructions (Signed)
Your scalp lesions could potentially be caused by a fungal infection or shingles, is less likely a bacterial infection.  Will treat with antifungal shampoo which she should use twice weekly, you can also use Selsun Blue shampoo in between, please discontinue using Axe shampoo as this can be irritating and worsen your symptoms.  Please follow-up with your primary care doctor early next week for recheck if symptoms are not improving they may need to change the therapy.  Return to the ED for worsening pain, fevers, spreading of rash or other new or concerning symptoms.

## 2017-10-15 NOTE — ED Provider Notes (Signed)
[facility] EMERGENCY DEPARTMENT Provider Note   CSN: [id number] Arrival date & time: 10/15/17  1508     History   Chief Complaint Chief Complaint  Patient presents with  . Headache    HPI Marc Walker is a 47 y.o. male.  Marc Walker is a 47 y.o. Male, with hx of diabetes, CKD, HTN, HLD, CAD s/p multiple stents, and CHF, who presents to the ED for evaluation of 4 days of pain over the back and right side of his head.  Patient reports "it feels like bruises and cuts and bumps all over the back of my head.  He denies any falls or trauma to the head.  Patient reports it does not feel like a headache feels like pain over the skin of the scalp, it is tender to the touch.  Patient denies any new shampoos, does report he uses axilla which has a large amount.  No new hair products.  Patient does report he wears a CPAP at night and the mask strap goes across the back of his head and wonders if this could have contributed.  He denies any history of similar symptoms.  He denies fevers or chills, unsure if any of the bumps that been draining at all.  Patient patient does not have history of again reports is feeling a headache he denies any fever or chills, neck pain or stiffness.  He denies any vision changes dizziness, numbness, tingling or weakness in any of his extremities.  No sensitivity to light no nausea or vomiting.  Patient denies any chest pain or shortness of breath denies any bumps or lesions elsewhere on the body.      Past Medical History:  Diagnosis Date  . Chronic diastolic CHF (congestive heart failure) (HCC) 03/09/2017   Echo 1/18: Mild LVH, EF 60-65, Gr 2 DD, mild LAE; difficult study-no obvious wall motion abnormalities with Definity  . CKD (chronic kidney disease)   . Coronary artery disease    a. s/pprior myocardial infarction, s/p multipleprior stents placedat outside institutions with last intervention 08/2015 at [facility].  . Diabetes mellitus (HCC)   . Drug  addiction in remission (HCC)   . Erectile dysfunction 03/02/2017  . Heartburn   . Hyperlipidemia associated with type 2 diabetes mellitus (HCC)   . Hypertension   . Hypothyroidism    a. adm with profound hypothyroidism near [facility] in 04/2017.  . MI (myocardial infarction) (HCC) 07/2009   [facility] in [place], [place] for last 2, 1st one in [place], [place]; total of 6 stents, last one 02/2014   . Morbid obesity (HCC) 07/06/2016  . Noncompliance with therapeutic plan    a. thyroid med -> profound hypothyroidism 04/2017.  Marland Kitchen OSA (obstructive sleep apnea) 07/06/2016   Moderate with AHI 21.8/hr by PSG 08/2012 now on CPAP  . Prolonged QT interval 05/14/2017  . Tobacco abuse   . Wide-complex tachycardia (HCC) 05/14/2017    Patient Active Problem List   Diagnosis Date Noted  . CAD S/P multiple PCI's 08/09/2017  . Prolonged QT interval 05/14/2017  . Wide-complex tachycardia (HCC) 05/14/2017  . GERD (gastroesophageal reflux disease) 05/13/2017  . Hypothyroidism   . Bilateral carpal tunnel syndrome 04/08/2017  . Chronic diastolic CHF (congestive heart failure) (HCC) 03/09/2017  . Erectile dysfunction 03/02/2017  . OSA on CPAP 07/06/2016  . Morbid obesity (HCC) 07/06/2016  . DM type 2 with diabetic peripheral neuropathy (HCC) 12/16/2015  . CAD (coronary artery disease) 09/30/2015  . Essential hypertension 09/16/2015  .  Hyperlipidemia     Past Surgical History:  Procedure Laterality Date  . APPENDECTOMY    . CARDIAC CATHETERIZATION N/A 09/16/2015   Procedure: Left Heart Cath and Coronary Angiography;  Surgeon: Runell Gess, MD;  Location: [facility] INVASIVE CV LAB;  Service: Cardiovascular;  Laterality: N/A;  . CARDIAC CATHETERIZATION N/A 09/16/2015   Procedure: Coronary Stent Intervention;  Surgeon: Runell Gess, MD;  Location: [facility] INVASIVE CV LAB;  Service: Cardiovascular;  Laterality: N/A;  . CORONARY STENT PLACEMENT          Home Medications    Prior to Admission medications     Medication Sig Start Date End Date Taking? Authorizing Provider  aspirin 81 MG tablet Take 1 tablet (81 mg total) by mouth daily. 11/17/16   Langeland, Dawn T, MD  buPROPion (WELLBUTRIN SR) 150 MG 12 hr tablet TAKE 1 TABLET BY MOUTH 2 (TWO) TIMES DAILY. 08/30/17   Marcine Matar, MD  clopidogrel (PLAVIX) 75 MG tablet Take 1 tablet (75 mg total) by mouth daily. 11/17/16   Langeland, Kathaleen Grinder, MD  empagliflozin (JARDIANCE) 10 MG TABS tablet Take 10 mg by mouth daily. 04/14/17   Marcine Matar, MD  ezetimibe (ZETIA) 10 MG tablet Take 1 tablet (10 mg total) by mouth daily. 04/14/17   Marcine Matar, MD  famotidine (PEPCID) 20 MG tablet Take 1 tablet (20 mg total) by mouth 2 (two) times daily. 09/09/17   Marcine Matar, MD  furosemide (LASIX) 80 MG tablet TAKE 1 TABLET 2 TIMES DAILY BY MOUTH 08/30/17   [provider]  gabapentin (NEURONTIN) 300 MG capsule TAKE 3 CAPSULES BY MOUTH 3 TIMES DAILY. 08/02/17   Hoy Register, MD  glucose blood test strip USE AS INSTRUCTED 08/30/17   [provider]  Insulin Syringes, Disposable, U-100 0.5 ML MISC Use as directed 06/11/17   Marcine Matar, MD  LANTUS 100 UNIT/ML injection INJECT 45 UNITS TOTAL INTO THE SKIN 2 TIMES DAILY. 09/30/17   Marcine Matar, MD  levothyroxine (SYNTHROID, LEVOTHROID) 75 MCG tablet TAKE 1 TABLET BY MOUTH DAILY 08/30/17   [provider]  metFORMIN (GLUCOPHAGE-XR) 500 MG 24 hr tablet TAKE 2 TABLETS BY MOUTH 2 TIMES DAILY WITH A MEAL. Patient taking differently: Take 1,000 mg 2 (two) times daily by mouth. TAKE 2 TABLETS BY MOUTH 2 TIMES DAILY WITH A MEAL. 03/17/17   Marcine Matar, MD  nitroGLYCERIN (NITROSTAT) 0.4 MG SL tablet Place 1 tablet (0.4 mg total) under the tongue every 5 (five) minutes as needed for chest pain. 09/17/15   Rai, Ripudeep K, MD  NOVOLOG 100 UNIT/ML injection INJECT 20 UNITS INTO THE SKIN 3  THREE  TIMES DAILY BEFORE MEALS 08/30/17   [provider]  Potassium Chloride  ER 20 MEQ TBCR Take 40 mEq 2 (two) times daily by mouth. 05/16/17 09/13/17  Rama, Maryruth Bun, MD  potassium chloride SA (K-DUR,KLOR-CON) 20 MEQ tablet Take 40 mEq by mouth 2 (two) times daily. 08/30/17   [provider]  TRUEPLUS LANCETS 28G MISC See admin instructions. 08/30/17   [provider]  VIAGRA 50 MG tablet TAKE 1 TABLET  50 MG TOTAL  BY MOUTH DAILY AS NEEDED FOR ERECTILE DYSFUNCTION 08/27/17   [provider]    Family History Family History  Adopted: Yes  Problem Relation Age of Onset  . Diabetes Maternal Grandmother     Social History Social History   Tobacco Use  . Smoking status: Former Games developer  . Smokeless  tobacco: Never Used  . Tobacco comment: quit 01/2017  Substance Use Topics  . Alcohol use: No    Alcohol/week: 0.0 oz  . Drug use: No     Allergies   Nsaids; Other; and Levofloxacin   Review of Systems Review of Systems  Constitutional: Negative for chills and fever.  HENT: Negative for congestion, rhinorrhea and sore throat.   Eyes: Negative for photophobia, pain and visual disturbance.  Respiratory: Negative for shortness of breath.   Cardiovascular: Negative for chest pain.  Gastrointestinal: Negative for abdominal pain, nausea and vomiting.  Genitourinary: Negative for dysuria, flank pain and frequency.  Musculoskeletal: Negative for arthralgias and myalgias.  Skin: Positive for rash. Negative for color change.  Neurological: Negative for dizziness, weakness, numbness and headaches.     Physical Exam Updated Vital Signs BP 128/90 (BP Location: Left Arm)   Pulse 64   Temp 98 F (36.7 C) (Oral)   Resp 18   Ht 5\' 5"  (1.651 m)   Wt 125.6 kg (277 lb)   SpO2 98%   BMI 46.10 kg/m   Physical Exam  Constitutional: He appears well-developed and well-nourished. No distress.  HENT:  Head: Normocephalic and atraumatic.  Mouth/Throat: Oropharynx is clear and moist.  Raised bumps over the back of the head and right side of  scalp, few pustules w/o any active drainage, no vesicles noted, rash crosses midline, mild erythema, no warmth, tender to palpation  Eyes: Pupils are equal, round, and reactive to light. EOM are normal. Right eye exhibits no discharge. Left eye exhibits no discharge.  No nystagmus  Neck: Normal range of motion. Neck supple.  No rigidity  Cardiovascular: Normal rate, regular rhythm, normal heart sounds and intact distal pulses.  Pulmonary/Chest: Effort normal and breath sounds normal. No stridor. No respiratory distress. He has no wheezes. He has no rales.  Respirations equal and unlabored, patient able to speak in full sentences, lungs clear to auscultation bilaterally  Abdominal: Soft. Bowel sounds are normal. He exhibits no distension and no mass. There is no tenderness. There is no guarding.  Musculoskeletal: He exhibits no edema or deformity.  Neurological: He is alert. Coordination normal.  Speech is clear, able to follow commands CN III-XII intact Normal strength in upper and lower extremities bilaterally including dorsiflexion and plantar flexion, strong and equal grip strength Sensation normal to light and sharp touch Moves extremities without ataxia, coordination intact Normal finger to nose and rapid alternating movements No pronator drift  Skin: Skin is warm and dry. Capillary refill takes less than 2 seconds. He is not diaphoretic.  Psychiatric: He has a normal mood and affect. His behavior is normal.  Nursing note and vitals reviewed.    ED Treatments / Results  Labs (all labs ordered are listed, but only abnormal results are displayed) Labs Reviewed  CBG MONITORING, ED - Abnormal; Notable for the following components:      Result Value   Glucose-Capillary 191 (*)    All other components within normal limits    EKG None  Radiology No results found.  Procedures Procedures (including critical care time)  Medications Ordered in ED Medications - No data to  display   Initial Impression / Assessment and Plan / ED Course  I have reviewed the triage vital signs and the nursing notes.  Pertinent labs & imaging results that were available during my care of the patient were reviewed by me and considered in my medical decision making (see chart for details).  Patient presents  to the ED for evaluation of irritation and bumps that are painful to the touch over the back of the head and right side of the scalp.  No fevers or chills.  Bumps are not actively draining there are a few pustules present.  Normal neurologic exam, no rigidity or meningismus.  Discussed with patient not exactly sure what this rash is, could potentially be  fungal scalp infection versus early shingles, less likely better, also be impetigo or dermatitis.  Discussed with Dr. Corlis LeakMackuen who saw and evaluated the patient as well will treat today with antifungal shampoo, encourage patient to use [facility] as well and to discontinue his use of Axe shampoo.  Patient to follow-up with his primary care doctor early next week for recheck if things are worsening he may require a different treatment potentially Valtrex versus antibiotics depending on how much with this.  Return precautions discussed.  Patient expresses understanding and is in agreement with plan.  The patient was discussed with and seen by Dr. Corlis LeakMackuen who agrees with the treatment plan.  Final Clinical Impressions(s) / ED Diagnoses   Final diagnoses:  Scalp irritation    ED Discharge Orders        Ordered    ketoconazole (NIZORAL) 2 % shampoo  2 times weekly     10/15/17 1859       Dartha LodgeFord, Eriona Kinchen N, PA-C 10/16/17 1327    Mackuen, Cindee Saltourteney Lyn, MD 10/16/17 2351

## 2017-10-15 NOTE — ED Triage Notes (Signed)
Head pain x 4 days.

## 2017-10-19 ENCOUNTER — Encounter: Payer: Self-pay | Admitting: Internal Medicine

## 2017-10-19 ENCOUNTER — Ambulatory Visit: Payer: Self-pay | Attending: Internal Medicine | Admitting: Internal Medicine

## 2017-10-19 VITALS — BP 141/64 | HR 64 | Temp 97.7°F | Resp 16 | Wt 281.8 lb

## 2017-10-19 DIAGNOSIS — Z79899 Other long term (current) drug therapy: Secondary | ICD-10-CM | POA: Insufficient documentation

## 2017-10-19 DIAGNOSIS — L309 Dermatitis, unspecified: Secondary | ICD-10-CM | POA: Insufficient documentation

## 2017-10-19 DIAGNOSIS — I2510 Atherosclerotic heart disease of native coronary artery without angina pectoris: Secondary | ICD-10-CM | POA: Insufficient documentation

## 2017-10-19 DIAGNOSIS — Z794 Long term (current) use of insulin: Secondary | ICD-10-CM | POA: Insufficient documentation

## 2017-10-19 DIAGNOSIS — E039 Hypothyroidism, unspecified: Secondary | ICD-10-CM | POA: Insufficient documentation

## 2017-10-19 DIAGNOSIS — I5032 Chronic diastolic (congestive) heart failure: Secondary | ICD-10-CM | POA: Insufficient documentation

## 2017-10-19 DIAGNOSIS — H61031 Chondritis of right external ear: Secondary | ICD-10-CM | POA: Insufficient documentation

## 2017-10-19 DIAGNOSIS — Z955 Presence of coronary angioplasty implant and graft: Secondary | ICD-10-CM | POA: Insufficient documentation

## 2017-10-19 DIAGNOSIS — E118 Type 2 diabetes mellitus with unspecified complications: Secondary | ICD-10-CM | POA: Insufficient documentation

## 2017-10-19 DIAGNOSIS — Z79890 Hormone replacement therapy (postmenopausal): Secondary | ICD-10-CM | POA: Insufficient documentation

## 2017-10-19 DIAGNOSIS — Z7902 Long term (current) use of antithrombotics/antiplatelets: Secondary | ICD-10-CM | POA: Insufficient documentation

## 2017-10-19 DIAGNOSIS — L739 Follicular disorder, unspecified: Secondary | ICD-10-CM | POA: Insufficient documentation

## 2017-10-19 DIAGNOSIS — K219 Gastro-esophageal reflux disease without esophagitis: Secondary | ICD-10-CM | POA: Insufficient documentation

## 2017-10-19 DIAGNOSIS — G4733 Obstructive sleep apnea (adult) (pediatric): Secondary | ICD-10-CM | POA: Insufficient documentation

## 2017-10-19 DIAGNOSIS — Z886 Allergy status to analgesic agent status: Secondary | ICD-10-CM | POA: Insufficient documentation

## 2017-10-19 DIAGNOSIS — Z833 Family history of diabetes mellitus: Secondary | ICD-10-CM | POA: Insufficient documentation

## 2017-10-19 DIAGNOSIS — Z87891 Personal history of nicotine dependence: Secondary | ICD-10-CM | POA: Insufficient documentation

## 2017-10-19 DIAGNOSIS — Z6841 Body Mass Index (BMI) 40.0 and over, adult: Secondary | ICD-10-CM | POA: Insufficient documentation

## 2017-10-19 DIAGNOSIS — E785 Hyperlipidemia, unspecified: Secondary | ICD-10-CM | POA: Insufficient documentation

## 2017-10-19 DIAGNOSIS — I110 Hypertensive heart disease with heart failure: Secondary | ICD-10-CM | POA: Insufficient documentation

## 2017-10-19 DIAGNOSIS — Z7982 Long term (current) use of aspirin: Secondary | ICD-10-CM | POA: Insufficient documentation

## 2017-10-19 LAB — POCT GLYCOSYLATED HEMOGLOBIN (HGB A1C): HEMOGLOBIN A1C: 7.5

## 2017-10-19 LAB — GLUCOSE, POCT (MANUAL RESULT ENTRY): POC GLUCOSE: 221 mg/dL — AB (ref 70–99)

## 2017-10-19 MED ORDER — CLOBETASOL PROPIONATE 0.05 % EX SOLN: 1.0000 "application " | Freq: Two times a day (BID) | CUTANEOUS | 0 refills | Status: DC

## 2017-10-19 MED ORDER — DOXYCYCLINE HYCLATE 100 MG PO TABS: 100.0000 mg | ORAL_TABLET | Freq: Two times a day (BID) | ORAL | 0 refills | Status: DC

## 2017-10-19 MED FILL — DOXYCYCLINE 100 MG TABLET: 100 | 7 days supply | Qty: 14 | Fill #0

## 2017-10-19 MED FILL — CLOBETASOL 0.05% SOLUTION: 0.05 | 25 days supply | Qty: 50 | Fill #0

## 2017-10-19 NOTE — Progress Notes (Signed)
Patient ID: Marc Walker, male    DOB: 1970-08-19  MRN: [id number]  CC: ER f/u  Subjective: Marc Walker is a 47 y.o. male who presents for ER f/u His concerns today include:   Seen in ER 10/15/2017 for pain/soreness at back of head x several days -"itches really bad; feels like its cut up back there.  And feels like there are sores back there too."  He has not been able to wear his CPAP mask because of the symptoms -Still complains of RT ear pain mainly of the pinna that started around the same time as the rash on his scalp.  "Feels like someone punch me in the ear."  No drainage from the ear Rash along the RT jaw line.  -no one else in house with a rash.  -In the ED he was assessed to have possible fungal infection and was given some ketoconazole shampoo which she has been using with minimal relief  Patient Active Problem List   Diagnosis Date Noted  . CAD S/P multiple PCI's 08/09/2017  . Prolonged QT interval 05/14/2017  . Wide-complex tachycardia (HCC) 05/14/2017  . GERD (gastroesophageal reflux disease) 05/13/2017  . Hypothyroidism   . Bilateral carpal tunnel syndrome 04/08/2017  . Chronic diastolic CHF (congestive heart failure) (HCC) 03/09/2017  . Erectile dysfunction 03/02/2017  . OSA on CPAP 07/06/2016  . Morbid obesity (HCC) 07/06/2016  . DM type 2 with diabetic peripheral neuropathy (HCC) 12/16/2015  . CAD (coronary artery disease) 09/30/2015  . Essential hypertension 09/16/2015  . Hyperlipidemia      Current Outpatient Medications on File Prior to Visit  Medication Sig Dispense Refill  . aspirin 81 MG tablet Take 1 tablet (81 mg total) by mouth daily. 90 tablet 3  . buPROPion (WELLBUTRIN SR) 150 MG 12 hr tablet TAKE 1 TABLET BY MOUTH 2 (TWO) TIMES DAILY. 60 tablet 2  . clopidogrel (PLAVIX) 75 MG tablet Take 1 tablet (75 mg total) by mouth daily. 30 tablet 10  . empagliflozin (JARDIANCE) 10 MG TABS tablet Take 10 mg by mouth daily. 90 tablet 3  . ezetimibe  (ZETIA) 10 MG tablet Take 1 tablet (10 mg total) by mouth daily. 90 tablet 3  . famotidine (PEPCID) 20 MG tablet Take 1 tablet (20 mg total) by mouth 2 (two) times daily. 60 tablet 3  . furosemide (LASIX) 80 MG tablet TAKE 1 TABLET 2 TIMES DAILY BY MOUTH  3  . gabapentin (NEURONTIN) 300 MG capsule TAKE 3 CAPSULES BY MOUTH 3 TIMES DAILY. 180 capsule 0  . glucose blood test strip USE AS INSTRUCTED  12  . Insulin Syringes, Disposable, U-100 0.5 ML MISC Use as directed 100 each 11  . ketoconazole (NIZORAL) 2 % shampoo Apply 1 application topically 2 (two) times a week. 120 mL 0  . LANTUS 100 UNIT/ML injection INJECT 45 UNITS TOTAL INTO THE SKIN 2 TIMES DAILY. 30 mL 2  . levothyroxine (SYNTHROID, LEVOTHROID) 75 MCG tablet TAKE 1 TABLET BY MOUTH DAILY  3  . metFORMIN (GLUCOPHAGE-XR) 500 MG 24 hr tablet TAKE 2 TABLETS BY MOUTH 2 TIMES DAILY WITH A MEAL. (Patient taking differently: Take 1,000 mg 2 (two) times daily by mouth. TAKE 2 TABLETS BY MOUTH 2 TIMES DAILY WITH A MEAL.) 120 tablet 11  . nitroGLYCERIN (NITROSTAT) 0.4 MG SL tablet Place 1 tablet (0.4 mg total) under the tongue every 5 (five) minutes as needed for chest pain. 30 tablet 12  . NOVOLOG 100 UNIT/ML injection INJECT 20 UNITS  INTO THE SKIN 3  THREE  TIMES DAILY BEFORE MEALS  11  . Potassium Chloride ER 20 MEQ TBCR Take 40 mEq 2 (two) times daily by mouth. 180 tablet 2  . potassium chloride SA (K-DUR,KLOR-CON) 20 MEQ tablet Take 40 mEq by mouth 2 (two) times daily.  2  . TRUEPLUS LANCETS 28G MISC See admin instructions.  12  . VIAGRA 50 MG tablet TAKE 1 TABLET  50 MG TOTAL  BY MOUTH DAILY AS NEEDED FOR ERECTILE DYSFUNCTION  1   No current facility-administered medications on file prior to visit.     Allergies  Allergen Reactions  . Nsaids     Avoid due to CKD  . 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  Pt only wants pain medicine if absolutely necessary.  . Levofloxacin Rash    Rash on back (not sun  exposed)and hypersensitivity to skin on exposed skin/arm    Social History   Socioeconomic History  . Marital status: Divorced    Spouse name: Not on file  . Number of children: Not on file  . Years of education: Not on file  . Highest education level: Not on file  Occupational History  . Occupation: Company secretary  Social Needs  . Financial resource strain: Not on file  . Food insecurity:    Worry: Not on file    Inability: Not on file  . Transportation needs:    Medical: Not on file    Non-medical: Not on file  Tobacco Use  . Smoking status: Former Games developer  . Smokeless tobacco: Never Used  . Tobacco comment: quit 01/2017  Substance and Sexual Activity  . Alcohol use: No    Alcohol/week: 0.0 oz  . Drug use: No  . Sexual activity: Not on file  Lifestyle  . Physical activity:    Days per week: Not on file    Minutes per session: Not on file  . Stress: Not on file  Relationships  . Social connections:    Talks on phone: Not on file    Gets together: Not on file    Attends religious service: Not on file    Active member of club or organization: Not on file    Attends meetings of clubs or organizations: Not on file    Relationship status: Not on file  . Intimate partner violence:    Fear of current or ex partner: Not on file    Emotionally abused: Not on file    Physically abused: Not on file    Forced sexual activity: Not on file  Other Topics Concern  . Not on file  Social History Narrative   Adopted. Very little info on biological parents, ?diabetes.   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 Diabetes Maternal Grandmother     Past Surgical History:  Procedure Laterality Date  . APPENDECTOMY    . CARDIAC CATHETERIZATION N/A 09/16/2015   Procedure: Left Heart Cath and Coronary Angiography;  Surgeon: Runell Gess, MD;  Location: [facility] INVASIVE CV LAB;  Service: Cardiovascular;  Laterality: N/A;  .  CARDIAC CATHETERIZATION N/A 09/16/2015   Procedure: Coronary Stent Intervention;  Surgeon: Runell Gess, MD;  Location: [facility] INVASIVE CV LAB;  Service: Cardiovascular;  Laterality: N/A;  . CORONARY STENT PLACEMENT      ROS: Review of Systems Negative except as stated  above PHYSICAL EXAM: BP (!) 141/64   Pulse 64   Temp 97.7 F (36.5 C) (Oral)   Resp 16   Wt 281 lb 12.8 oz (127.8 kg)   SpO2 99%   BMI 46.89 kg/m   Physical Exam  General appearance - alert, well appearing, and in no distress Mental status - alert, oriented to person, place, and time, normal mood, behavior, speech, dress, motor activity, and thought processes Eyes - pupils equal and reactive, extraocular eye movements intact Ears -right ear: Mild erythema and tenderness of the helix and antitraqus skin -scalp: On the right parietal and occipital area of the scalp there are flaky, erythematous patches with scabbed over sores.  Very tender to touch.  No fluctuance.  Does not appear to be in a dermatomal pattern. Mild folliculitis along the right jawline  Results for orders placed or performed in visit on 10/19/17  POCT glucose (manual entry)  Result Value Ref Range   POC Glucose 221 (A) 70 - 99 mg/dl  POCT glycosylated hemoglobin (Hb A1C)  Result Value Ref Range   Hemoglobin A1C 7.5     ASSESSMENT AND PLAN: 1. Acute folliculitis 2. Chondritis of right external ear - doxycycline (VIBRA-TABS) 100 MG tablet; Take 1 tablet (100 mg total) by mouth 2 (two) times daily.  Dispense: 14 tablet; Refill: 0  3. Dermatitis Of the scalp.  I have given him some clobetasol solution to apply twice a day for the next 5 days.  Advised to call me or send me a my chart message if no improvement or any worsening. - doxycycline (VIBRA-TABS) 100 MG tablet; Take 1 tablet (100 mg total) by mouth 2 (two) times daily.  Dispense: 14 tablet; Refill: 0 - clobetasol (TEMOVATE) 0.05 % external solution; Apply 1 application topically 2 (two)  times daily. Use for 5 days then PRN  Dispense: 50 mL; Refill: 0  4. Type 2 diabetes mellitus with complication, with long-term current use of insulin (HCC) Diabetes will be addressed on his routine follow-up visit in 2 weeks - POCT glucose (manual entry) - POCT glycosylated hemoglobin (Hb A1C)  Patient was given the opportunity to ask questions.  Patient verbalized understanding of the plan and was able to repeat key elements of the plan.   Orders Placed This Encounter  Procedures  . POCT glucose (manual entry)  . POCT glycosylated hemoglobin (Hb A1C)     Requested Prescriptions   Signed Prescriptions Disp Refills  . doxycycline (VIBRA-TABS) 100 MG tablet 14 tablet 0    Sig: Take 1 tablet (100 mg total) by mouth 2 (two) times daily.  . clobetasol (TEMOVATE) 0.05 % external solution 50 mL 0    Sig: Apply 1 application topically 2 (two) times daily. Use for 5 days then PRN    Return if symptoms worsen or fail to improve.  Jonah Blue, MD, FACP

## 2017-11-01 ENCOUNTER — Other Ambulatory Visit: Payer: Self-pay | Admitting: Internal Medicine

## 2017-11-01 MED FILL — ROSUVASTATIN CALCIUM 40 MG: 40 | 30 days supply | Qty: 30 | Fill #10

## 2017-11-01 MED FILL — $novoLOG 100 UNITS/ML VIAL: 100 | 33 days supply | Qty: 20 | Fill #2

## 2017-11-01 MED FILL — POTASSIUM CL ER 20 MEQ TAB: 20 | 30 days supply | Qty: 120 | Fill #0

## 2017-11-01 MED FILL — $LANTUS 100 UNITS/ML VIAL: 100 | 33 days supply | Qty: 30 | Fill #1

## 2017-11-01 MED FILL — ?FAMOTIDINE 20 MG TABLET: 20 | 30 days supply | Qty: 60 | Fill #1

## 2017-11-01 MED FILL — ?FUROSEMIDE 80MG TABLET: 80 | 30 days supply | Qty: 60 | Fill #5

## 2017-11-01 MED FILL — TRUEplus LANCETS 28G MISC: 30 days supply | Qty: 100 | Fill #8

## 2017-11-01 MED FILL — METFORMIN HCL ER 500 MG TAB: 500 | 30 days supply | Qty: 120 | Fill #7

## 2017-11-01 MED FILL — ?CLOPIDOGREL 75MG TAB: 75 | 30 days supply | Qty: 30 | Fill #10

## 2017-11-01 MED FILL — ?METOPROLOL 50 MG TABLET: 50 | 30 days supply | Qty: 60 | Fill #10

## 2017-11-01 MED FILL — TRUE METRIX TEST STRIP: 30 days supply | Qty: 100 | Fill #4

## 2017-11-01 MED FILL — BUPROPION SR 150 MG TABLET: 150 | 30 days supply | Qty: 60 | Fill #2

## 2017-11-01 NOTE — Telephone Encounter (Signed)
Okay to refill? Please advise. Thanks, MI 

## 2017-11-02 ENCOUNTER — Other Ambulatory Visit: Payer: Self-pay | Admitting: Internal Medicine

## 2017-11-02 ENCOUNTER — Ambulatory Visit: Payer: Self-pay | Attending: Internal Medicine

## 2017-11-02 ENCOUNTER — Encounter: Payer: Self-pay | Admitting: Internal Medicine

## 2017-11-02 DIAGNOSIS — E039 Hypothyroidism, unspecified: Secondary | ICD-10-CM | POA: Insufficient documentation

## 2017-11-02 MED ORDER — LEVOTHYROXINE SODIUM 125 MCG PO TABS: 125.0000 ug | ORAL_TABLET | Freq: Every day | ORAL | 3 refills | Status: DC

## 2017-11-02 MED FILL — LEVOTHYROXINE 125 MCG TAB: 125 | 30 days supply | Qty: 30 | Fill #0

## 2017-11-02 NOTE — Progress Notes (Signed)
Patient here for lab visit only 

## 2017-11-03 ENCOUNTER — Telehealth: Payer: Self-pay | Admitting: Internal Medicine

## 2017-11-03 DIAGNOSIS — E039 Hypothyroidism, unspecified: Secondary | ICD-10-CM

## 2017-11-03 LAB — TSH: TSH: 21.86 u[IU]/mL — ABNORMAL HIGH (ref 0.450–4.500)

## 2017-11-03 NOTE — Telephone Encounter (Signed)
Phone call placed to patient this morning to go over the results of thyroid tests.  His TSH has improved a little but still elevated.  I had sent him a my chart message in March informing him to increase the levothyroxine from to 125 mcg.  Patient states that he never received that message and had continued to take the 100 mcg.  He picked up to 125 mcg tablets yesterday.  Patient advised to take the 125 mcg daily and return to the lab in 4 to 6 weeks for repeat thyroid check.  He expressed understanding.

## 2017-11-04 NOTE — Telephone Encounter (Signed)
Will route to Dr. Tenny Craw. Per last ov note- 08/27/17:  1. Chronic diastolic CHF - Volume is still up  This will not improve until TSH is much imporved.  .Conitnue current meds    2  CAD - symptoms to sugg angina   3  CKD  Will need to be followed  4  Hx WCT   Will review  Avoid drugs that prolong  QT  5  Hypothyroidism.   TSH is going back up  ? Compliance   Will forward to primary  Pt may benefit from endocrine consult  2. Erectile dysfunction - Short course of sildenafil given  Avoid NTG around use of this drug

## 2017-11-08 ENCOUNTER — Ambulatory Visit: Payer: Self-pay | Attending: Internal Medicine | Admitting: Internal Medicine

## 2017-11-08 ENCOUNTER — Encounter: Payer: Self-pay | Admitting: Internal Medicine

## 2017-11-08 VITALS — BP 131/88 | HR 73 | Temp 98.5°F | Resp 16 | Wt 282.0 lb

## 2017-11-08 DIAGNOSIS — I5032 Chronic diastolic (congestive) heart failure: Secondary | ICD-10-CM | POA: Insufficient documentation

## 2017-11-08 DIAGNOSIS — N529 Male erectile dysfunction, unspecified: Secondary | ICD-10-CM

## 2017-11-08 DIAGNOSIS — E1142 Type 2 diabetes mellitus with diabetic polyneuropathy: Secondary | ICD-10-CM

## 2017-11-08 DIAGNOSIS — I2510 Atherosclerotic heart disease of native coronary artery without angina pectoris: Secondary | ICD-10-CM | POA: Insufficient documentation

## 2017-11-08 DIAGNOSIS — Z87891 Personal history of nicotine dependence: Secondary | ICD-10-CM | POA: Insufficient documentation

## 2017-11-08 DIAGNOSIS — E785 Hyperlipidemia, unspecified: Secondary | ICD-10-CM | POA: Insufficient documentation

## 2017-11-08 DIAGNOSIS — I10 Essential (primary) hypertension: Secondary | ICD-10-CM

## 2017-11-08 DIAGNOSIS — G4733 Obstructive sleep apnea (adult) (pediatric): Secondary | ICD-10-CM

## 2017-11-08 DIAGNOSIS — Z955 Presence of coronary angioplasty implant and graft: Secondary | ICD-10-CM | POA: Insufficient documentation

## 2017-11-08 DIAGNOSIS — Z833 Family history of diabetes mellitus: Secondary | ICD-10-CM | POA: Insufficient documentation

## 2017-11-08 DIAGNOSIS — Z7982 Long term (current) use of aspirin: Secondary | ICD-10-CM | POA: Insufficient documentation

## 2017-11-08 DIAGNOSIS — Z794 Long term (current) use of insulin: Secondary | ICD-10-CM | POA: Insufficient documentation

## 2017-11-08 DIAGNOSIS — K219 Gastro-esophageal reflux disease without esophagitis: Secondary | ICD-10-CM

## 2017-11-08 DIAGNOSIS — Z9989 Dependence on other enabling machines and devices: Secondary | ICD-10-CM

## 2017-11-08 DIAGNOSIS — L309 Dermatitis, unspecified: Secondary | ICD-10-CM

## 2017-11-08 DIAGNOSIS — Z79899 Other long term (current) drug therapy: Secondary | ICD-10-CM | POA: Insufficient documentation

## 2017-11-08 DIAGNOSIS — I110 Hypertensive heart disease with heart failure: Secondary | ICD-10-CM | POA: Insufficient documentation

## 2017-11-08 DIAGNOSIS — E039 Hypothyroidism, unspecified: Secondary | ICD-10-CM

## 2017-11-08 DIAGNOSIS — Z9889 Other specified postprocedural states: Secondary | ICD-10-CM | POA: Insufficient documentation

## 2017-11-08 LAB — GLUCOSE, POCT (MANUAL RESULT ENTRY): POC Glucose: 237 mg/dl — AB (ref 70–99)

## 2017-11-08 MED ORDER — CLOBETASOL PROPIONATE 0.05 % EX SOLN: 1.0000 "application " | Freq: Two times a day (BID) | CUTANEOUS | 0 refills | Status: DC

## 2017-11-08 MED ORDER — SILDENAFIL CITRATE 50 MG PO TABS: ORAL_TABLET | ORAL | 3 refills | Status: DC

## 2017-11-08 MED ORDER — OMEPRAZOLE 20 MG PO CPDR: 20.0000 mg | DELAYED_RELEASE_CAPSULE | Freq: Every day | ORAL | 6 refills | Status: DC

## 2017-11-08 MED FILL — CLOBETASOL 0.05% SOLUTION: 0.05 | 20 days supply | Qty: 50 | Fill #0

## 2017-11-08 MED FILL — $Viagra 50mg tablet: 50 | 30 days supply | Qty: 10 | Fill #0

## 2017-11-08 MED FILL — ?OMEPRAZOLE DR 20MG CAPSULE: 20 | 30 days supply | Qty: 30 | Fill #0

## 2017-11-08 NOTE — Patient Instructions (Signed)
Start using your CPAP machine again as discussed.  Stop Pepcid.  Start Omeprazole.    Food Choices for Gastroesophageal Reflux Disease, Adult When you have gastroesophageal reflux disease (GERD), the foods you eat and your eating habits are very important. Choosing the right foods can help ease your discomfort. What guidelines do I need to follow?  Choose fruits, vegetables, whole grains, and low-fat dairy products.  Choose low-fat meat, fish, and poultry.  Limit fats such as oils, salad dressings, butter, nuts, and avocado.  Keep a food diary. This helps you identify foods that cause symptoms.  Avoid foods that cause symptoms. These may be different for everyone.  Eat small meals often instead of 3 large meals a day.  Eat your meals slowly, in a place where you are relaxed.  Limit fried foods.  Cook foods using methods other than frying.  Avoid drinking alcohol.  Avoid drinking large amounts of liquids with your meals.  Avoid bending over or lying down until 2-3 hours after eating. What foods are not recommended? These are some foods and drinks that may make your symptoms worse: Vegetables Tomatoes. Tomato juice. Tomato and spaghetti sauce. Chili peppers. Onion and garlic. Horseradish. Fruits Oranges, grapefruit, and lemon (fruit and juice). Meats High-fat meats, fish, and poultry. This includes hot dogs, ribs, ham, sausage, salami, and bacon. Dairy Whole milk and chocolate milk. Sour cream. Cream. Butter. Ice cream. Cream cheese. Drinks Coffee and tea. Bubbly (carbonated) drinks or energy drinks. Condiments Hot sauce. Barbecue sauce. Sweets/Desserts Chocolate and cocoa. Donuts. Peppermint and spearmint. Fats and Oils High-fat foods. This includes [place] fries and potato chips. Other Vinegar. Strong spices. This includes black pepper, white pepper, red pepper, cayenne, curry powder, cloves, ginger, and chili powder. The items listed above may not be a complete  list of foods and drinks to avoid. Contact your dietitian for more information. This information is not intended to replace advice given to you by your health care provider. Make sure you discuss any questions you have with your health care provider. Document Released: 12/15/2011 Document Revised: 11/21/2015 Document Reviewed: 04/19/2013 Elsevier Interactive Patient Education  2017 [employer].

## 2017-11-08 NOTE — Progress Notes (Signed)
Patient ID: Marc Walker, male    DOB: 1970/08/24  MRN: [id number]  CC: Diabetes and Hypertension   Subjective: Marc Walker is a 47 y.o. male who presents for chronid ds management His concerns today include:  47 year old male with history of CAD (8stents), DM type II, HL, HTN, obesity, OSA on CPAP, tobacco dependence, BPH, hypothyroid.  Rash on scalp much better with steroid solution but still itchy. Not able to use CPAP still.  Feeling sleepy because of this.  DM: checks BS 4-5 x a day.  Does not have glucometer or log with him Gives range 120-150. Highest was 180.  Eating habits:  Drinking a lot more water.  He stopped drinking ice tea and sodas.  Drinks 1 % milk Exercise:  Not walking as much as he wants to but much more than he did before. He stopped Gabapentin as it does not seem to help with neuropathy.  Hypothyroid:  Taking the Levothyroxine 125 mcg as discussed on telephone last wk.    Chronic CHF/CAD:  No LE edema.  SOB if he exerts himself.  No CP. Wgh has remained stable compared to last visit.  Tries to limit salt  GERD:  Pepcid not working.  Having to use Rolaids  Request RF on Viagra Patient Active Problem List   Diagnosis Date Noted  . CAD S/P multiple PCI's 08/09/2017  . Prolonged QT interval 05/14/2017  . Wide-complex tachycardia (HCC) 05/14/2017  . GERD (gastroesophageal reflux disease) 05/13/2017  . Hypothyroidism   . Bilateral carpal tunnel syndrome 04/08/2017  . Chronic diastolic CHF (congestive heart failure) (HCC) 03/09/2017  . Erectile dysfunction 03/02/2017  . OSA on CPAP 07/06/2016  . Morbid obesity (HCC) 07/06/2016  . DM type 2 with diabetic peripheral neuropathy (HCC) 12/16/2015  . CAD (coronary artery disease) 09/30/2015  . Essential hypertension 09/16/2015  . Hyperlipidemia      Current Outpatient Medications on File Prior to Visit  Medication Sig Dispense Refill  . aspirin 81 MG tablet Take 1 tablet (81 mg total) by mouth daily.  90 tablet 3  . buPROPion (WELLBUTRIN SR) 150 MG 12 hr tablet TAKE 1 TABLET BY MOUTH 2 (TWO) TIMES DAILY. 60 tablet 2  . clopidogrel (PLAVIX) 75 MG tablet Take 1 tablet (75 mg total) by mouth daily. 30 tablet 10  . empagliflozin (JARDIANCE) 10 MG TABS tablet Take 10 mg by mouth daily. 90 tablet 3  . ezetimibe (ZETIA) 10 MG tablet Take 1 tablet (10 mg total) by mouth daily. 90 tablet 3  . furosemide (LASIX) 80 MG tablet TAKE 1 TABLET 2 TIMES DAILY BY MOUTH  3  . glucose blood test strip USE AS INSTRUCTED  12  . Insulin Syringes, Disposable, U-100 0.5 ML MISC Use as directed 100 each 11  . ketoconazole (NIZORAL) 2 % shampoo Apply 1 application topically 2 (two) times a week. 120 mL 0  . LANTUS 100 UNIT/ML injection INJECT 45 UNITS TOTAL INTO THE SKIN 2 TIMES DAILY. 30 mL 2  . levothyroxine (SYNTHROID, LEVOTHROID) 125 MCG tablet Take 1 tablet (125 mcg total) by mouth daily. 90 tablet 3  . metFORMIN (GLUCOPHAGE-XR) 500 MG 24 hr tablet TAKE 2 TABLETS BY MOUTH 2 TIMES DAILY WITH A MEAL. (Patient taking differently: Take 1,000 mg 2 (two) times daily by mouth. TAKE 2 TABLETS BY MOUTH 2 TIMES DAILY WITH A MEAL.) 120 tablet 11  . nitroGLYCERIN (NITROSTAT) 0.4 MG SL tablet Place 1 tablet (0.4 mg total) under the tongue every 5 (five)  minutes as needed for chest pain. 30 tablet 12  . NOVOLOG 100 UNIT/ML injection INJECT 20 UNITS INTO THE SKIN 3  THREE  TIMES DAILY BEFORE MEALS  11  . potassium chloride SA (K-DUR,KLOR-CON) 20 MEQ tablet Take 40 mEq by mouth 2 (two) times daily.  2  . TRUEPLUS LANCETS 28G MISC See admin instructions.  12   No current facility-administered medications on file prior to visit.     Allergies  Allergen Reactions  . Nsaids     Avoid due to CKD  . 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  Pt only wants pain medicine if absolutely necessary.  . Levofloxacin Rash    Rash on back (not sun exposed)and hypersensitivity to skin on exposed skin/arm    Social  History   Socioeconomic History  . Marital status: Divorced    Spouse name: Not on file  . Number of children: Not on file  . Years of education: Not on file  . Highest education level: Not on file  Occupational History  . Occupation: Company secretary  Social Needs  . Financial resource strain: Not on file  . Food insecurity:    Worry: Not on file    Inability: Not on file  . Transportation needs:    Medical: Not on file    Non-medical: Not on file  Tobacco Use  . Smoking status: Former Games developer  . Smokeless tobacco: Never Used  . Tobacco comment: quit 01/2017  Substance and Sexual Activity  . Alcohol use: No    Alcohol/week: 0.0 oz  . Drug use: No  . Sexual activity: Not on file  Lifestyle  . Physical activity:    Days per week: Not on file    Minutes per session: Not on file  . Stress: Not on file  Relationships  . Social connections:    Talks on phone: Not on file    Gets together: Not on file    Attends religious service: Not on file    Active member of club or organization: Not on file    Attends meetings of clubs or organizations: Not on file    Relationship status: Not on file  . Intimate partner violence:    Fear of current or ex partner: Not on file    Emotionally abused: Not on file    Physically abused: Not on file    Forced sexual activity: Not on file  Other Topics Concern  . Not on file  Social History Narrative   Adopted. Very little info on biological parents, ?diabetes.   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 Diabetes Maternal Grandmother     Past Surgical History:  Procedure Laterality Date  . APPENDECTOMY    . CARDIAC CATHETERIZATION N/A 09/16/2015   Procedure: Left Heart Cath and Coronary Angiography;  Surgeon: Runell Gess, MD;  Location: [facility] INVASIVE CV LAB;  Service: Cardiovascular;  Laterality: N/A;  . CARDIAC CATHETERIZATION N/A 09/16/2015   Procedure: Coronary Stent  Intervention;  Surgeon: Runell Gess, MD;  Location: [facility] INVASIVE CV LAB;  Service: Cardiovascular;  Laterality: N/A;  . CORONARY STENT PLACEMENT      ROS: Review of Systems Negative except as above PHYSICAL EXAM: BP 131/88   Pulse 73   Temp 98.5 F (36.9 C) (Oral)   Resp 16   Wt 282 lb (127.9  kg)   SpO2 97%   BMI 46.93 kg/m   Physical Exam  General appearance - alert, well appearing, and in no distress Mental status - normal mood, behavior, speech, dress, motor activity, and thought processes Neck - supple, no significant adenopathy Chest - clear to auscultation, no wheezes, rales or rhonchi, symmetric air entry Heart - normal rate, regular rhythm, normal S1, S2, no murmurs, rubs, clicks or gallops Extremities -no lower extremity edema Skin -rash on scalp is about 95% improved.  He has 2 small scabbed areas left.  Will give refill on the clobetasol solution.  BS 237 Lab Results  Component Value Date   HGBA1C 7.5 10/19/2017    ASSESSMENT AND PLAN: 1. DM type 2 with diabetic peripheral neuropathy (HCC) Reported blood sugars within good range.  Encouraged to keep up the good works with trying to eat healthy.  Encouraged to move more. Continue Lantus, Jardiance, metformin and NovoLog - POCT glucose (manual entry)  2. Dermatitis - clobetasol (TEMOVATE) 0.05 % external solution; Apply 1 application topically 2 (two) times daily. Use for 5 days then PRN  Dispense: 50 mL; Refill: 0  3. Gastroesophageal reflux disease without esophagitis - omeprazole (PRILOSEC) 20 MG capsule; Take 1 capsule (20 mg total) by mouth daily.  Dispense: 30 capsule; Refill: 6  4. Hypothyroidism, unspecified type Continue levothyroxine 125 mcg daily.  Patient advised to take this medicine first thing this 30 minutes to 1 hour before other medications.  5. Essential hypertension -Close to goal.  Continue current medications and low-salt diet  6. Morbid obesity (HCC) Encouraged him to start  moving more.  He is eating less which is good  7. OSA on CPAP Advised him to start using his CPAP machine again now that the rash on his scalp is better.  He may try wrapping the straps in a soft toilet paper in the event that the rash that he was getting was a reaction to the sraps on his CPAP mask  8. Erectile dysfunction, unspecified erectile dysfunction type - sildenafil (VIAGRA) 50 MG tablet; TAKE 1 TABLET (50 MG TOTAL) BY MOUTH DAILY AS NEEDED FOR ERECTILE DYSFUNCTION.  Dispense: 10 tablet; Refill: 3   Patient was given the opportunity to ask questions.  Patient verbalized understanding of the plan and was able to repeat key elements of the plan.   Orders Placed This Encounter  Procedures  . POCT glucose (manual entry)     Requested Prescriptions   Signed Prescriptions Disp Refills  . omeprazole (PRILOSEC) 20 MG capsule 30 capsule 6    Sig: Take 1 capsule (20 mg total) by mouth daily.  . sildenafil (VIAGRA) 50 MG tablet 10 tablet 3    Sig: TAKE 1 TABLET (50 MG TOTAL) BY MOUTH DAILY AS NEEDED FOR ERECTILE DYSFUNCTION.  . clobetasol (TEMOVATE) 0.05 % external solution 50 mL 0    Sig: Apply 1 application topically 2 (two) times daily. Use for 5 days then PRN    Return in about 3 months (around 02/08/2018).  Jonah Blue, MD, FACP

## 2017-11-29 ENCOUNTER — Ambulatory Visit (INDEPENDENT_AMBULATORY_CARE_PROVIDER_SITE_OTHER): Payer: Self-pay | Admitting: Internal Medicine

## 2017-11-29 ENCOUNTER — Encounter: Payer: Self-pay | Admitting: Internal Medicine

## 2017-11-29 ENCOUNTER — Other Ambulatory Visit: Payer: Self-pay | Admitting: Internal Medicine

## 2017-11-29 VITALS — BP 126/70 | HR 71 | Ht 65.0 in | Wt 284.8 lb

## 2017-11-29 DIAGNOSIS — I2510 Atherosclerotic heart disease of native coronary artery without angina pectoris: Secondary | ICD-10-CM

## 2017-11-29 DIAGNOSIS — I5032 Chronic diastolic (congestive) heart failure: Secondary | ICD-10-CM

## 2017-11-29 DIAGNOSIS — I10 Essential (primary) hypertension: Secondary | ICD-10-CM

## 2017-11-29 DIAGNOSIS — E039 Hypothyroidism, unspecified: Secondary | ICD-10-CM

## 2017-11-29 LAB — LIPID PANEL
Chol/HDL Ratio: 2.5 ratio (ref 0.0–5.0)
Cholesterol, Total: 101 mg/dL (ref 100–199)
HDL: 40 mg/dL (ref >39)
TRIGLYCERIDES: 347 mg/dL — AB (ref 0–149)
VLDL CHOLESTEROL CAL: 69 mg/dL — AB (ref 5–40)

## 2017-11-29 MED ORDER — BUPROPION HCL ER (SR) 150 MG PO TB12: ORAL_TABLET | ORAL | 3 refills | Status: DC

## 2017-11-29 MED ORDER — CLOPIDOGREL BISULFATE 75 MG PO TABS: 75.0000 mg | ORAL_TABLET | Freq: Every day | ORAL | 3 refills | Status: DC

## 2017-11-29 MED FILL — TRUE METRIX TEST STRIP: 30 days supply | Qty: 100 | Fill #5

## 2017-11-29 MED FILL — $Viagra 50mg tablet: 50 | 30 days supply | Qty: 10 | Fill #1

## 2017-11-29 MED FILL — BUPROPION SR 150 MG TABLET: 150 | 30 days supply | Qty: 60 | Fill #0

## 2017-11-29 MED FILL — ?FAMOTIDINE 20 MG TABLET: 20 | 30 days supply | Qty: 60 | Fill #2

## 2017-11-29 MED FILL — LEVOTHYROXINE 125 MCG TAB: 125 | 30 days supply | Qty: 30 | Fill #1

## 2017-11-29 MED FILL — $novoLOG 100 UNITS/ML VIAL: 100 | 33 days supply | Qty: 20 | Fill #3

## 2017-11-29 MED FILL — ?FUROSEMIDE 80MG TABLET: 80 | 30 days supply | Qty: 60 | Fill #6

## 2017-11-29 MED FILL — $LANTUS 100 UNITS/ML VIAL: 100 | 33 days supply | Qty: 30 | Fill #2

## 2017-11-29 MED FILL — CLOPIDOGREL 75 MG TABLET: 75 | 30 days supply | Qty: 30 | Fill #0

## 2017-11-29 MED FILL — METFORMIN HCL ER 500 MG TAB: 500 | 30 days supply | Qty: 120 | Fill #8

## 2017-11-29 NOTE — Progress Notes (Signed)
Cardiology Office Note    Date:  11/29/2017  ID:  Marc Walker, DOB 1971-04-12, MRN [id number] PCP:  Marcine Matar, MD  Cardiologist:  Dr. Arliss Journey ([facility])   Pt presents for f/u of CAD and edema  History of Present Illness:  Marc Walker is a 47 y.o. male with history of KM, HTN, HL, tob abuse, Chronic diastolc CHF, OSA,  CAD s/pprior myocardial infarction, s/p multipleprior stents  with last intervention 08/2015 (90% prox LAD; 70% mid LAD_(in stent:, 90% D2; 705 mid Lc; 80% mid RCA; OM2 stent patent. Last nuclear stress test in 9/17 demonstrated mild ischemia in the distal OM or PLA branch territory, felt to be low risk. Last echo 06/2016 was technically limited, EF 60-65%, grade 2 DD.  Pt found to be myxodemaous last year Had stopped thyroid meds   Admitted [facility]CT during diuresis  QT prolonging agents stopped    I saw the pt in March   Breathing is OK   He has not smoked in over 300 days He denies  CP   Says he doesn't have much swelling   Wt down   Not drinking sodas or sweet tea  Past Medical History:  Diagnosis Date  . Chronic diastolic CHF (congestive heart failure) (HCC) 03/09/2017   Echo 1/18: Mild LVH, EF 60-65, Gr 2 DD, mild LAE; difficult study-no obvious wall motion abnormalities with Definity  . CKD (chronic kidney disease)   . Coronary artery disease    a. s/pprior myocardial infarction, s/p multipleprior stents placedat outside institutions with last intervention 08/2015 at [facility].  . Diabetes mellitus (HCC)   . Drug addiction in remission (HCC)   . Erectile dysfunction 03/02/2017  . Heartburn   . Hyperlipidemia associated with type 2 diabetes mellitus (HCC)   . Hypertension   . Hypothyroidism    a. adm with profound hypothyroidism near [facility] in 04/2017.  . MI (myocardial infarction) (HCC) 07/2009   [facility] in [place], [place] for last 2, 1st one in [place], [place]; total of 6 stents, last one 02/2014   . Morbid obesity (HCC) 07/06/2016  . Noncompliance  with therapeutic plan    a. thyroid med -> profound hypothyroidism 04/2017.  Marland Kitchen OSA (obstructive sleep apnea) 07/06/2016   Moderate with AHI 21.8/hr by PSG 08/2012 now on CPAP  . Prolonged QT interval 05/14/2017  . Tobacco abuse   . Wide-complex tachycardia (HCC) 05/14/2017    Past Surgical History:  Procedure Laterality Date  . APPENDECTOMY    . CARDIAC CATHETERIZATION N/A 09/16/2015   Procedure: Left Heart Cath and Coronary Angiography;  Surgeon: Runell Gess, MD;  Location: [facility] INVASIVE CV LAB;  Service: Cardiovascular;  Laterality: N/A;  . CARDIAC CATHETERIZATION N/A 09/16/2015   Procedure: Coronary Stent Intervention;  Surgeon: Runell Gess, MD;  Location: [facility] INVASIVE CV LAB;  Service: Cardiovascular;  Laterality: N/A;  . CORONARY STENT PLACEMENT      Current Medications: Current Meds  Medication Sig  . aspirin 81 MG tablet Take 1 tablet (81 mg total) by mouth daily.  Marland Kitchen buPROPion (WELLBUTRIN SR) 150 MG 12 hr tablet TAKE 1 TABLET BY MOUTH 2 (TWO) TIMES DAILY.  . clobetasol (TEMOVATE) 0.05 % external solution Apply 1 application topically 2 (two) times daily. Use for 5 days then PRN  . clopidogrel (PLAVIX) 75 MG tablet Take 1 tablet (75 mg total) by mouth daily.  . empagliflozin (JARDIANCE) 10 MG TABS tablet Take 10 mg by mouth daily.  Marland Kitchen ezetimibe (ZETIA) 10  MG tablet Take 1 tablet (10 mg total) by mouth daily.  . furosemide (LASIX) 80 MG tablet TAKE 1 TABLET 2 TIMES DAILY BY MOUTH  . glucose blood test strip USE AS INSTRUCTED  . Insulin Syringes, Disposable, U-100 0.5 ML MISC Use as directed  . ketoconazole (NIZORAL) 2 % shampoo Apply 1 application topically 2 (two) times a week.  Marland Kitchen LANTUS 100 UNIT/ML injection INJECT 45 UNITS TOTAL INTO THE SKIN 2 TIMES DAILY.  Marland Kitchen levothyroxine (SYNTHROID, LEVOTHROID) 125 MCG tablet Take 1 tablet (125 mcg total) by mouth daily.  . metFORMIN (GLUCOPHAGE) 1000 MG tablet Take 1,000 mg by mouth 2 (two) times daily with a meal.  . nitroGLYCERIN  (NITROSTAT) 0.4 MG SL tablet Place 1 tablet (0.4 mg total) under the tongue every 5 (five) minutes as needed for chest pain.  Marland Kitchen NOVOLOG 100 UNIT/ML injection INJECT 20 UNITS INTO THE SKIN 3  THREE  TIMES DAILY BEFORE MEALS  . omeprazole (PRILOSEC) 20 MG capsule Take 1 capsule (20 mg total) by mouth daily.  . potassium chloride SA (K-DUR,KLOR-CON) 20 MEQ tablet Take 40 mEq by mouth 2 (two) times daily.  . sildenafil (VIAGRA) 50 MG tablet TAKE 1 TABLET (50 MG TOTAL) BY MOUTH DAILY AS NEEDED FOR ERECTILE DYSFUNCTION.  Marland Kitchen TRUEPLUS LANCETS 28G MISC See admin instructions.     Allergies:   Levofloxacin; Nsaids; and Other   Social History   Socioeconomic History  . Marital status: Divorced    Spouse name: Not on file  . Number of children: Not on file  . Years of education: Not on file  . Highest education level: Not on file  Occupational History  . Occupation: Company secretary  Social Needs  . Financial resource strain: Not on file  . Food insecurity:    Worry: Not on file    Inability: Not on file  . Transportation needs:    Medical: Not on file    Non-medical: Not on file  Tobacco Use  . Smoking status: Former Games developer  . Smokeless tobacco: Never Used  . Tobacco comment: quit 01/2017  Substance and Sexual Activity  . Alcohol use: No    Alcohol/week: 0.0 oz  . Drug use: No  . Sexual activity: Not on file  Lifestyle  . Physical activity:    Days per week: Not on file    Minutes per session: Not on file  . Stress: Not on file  Relationships  . Social connections:    Talks on phone: Not on file    Gets together: Not on file    Attends religious service: Not on file    Active member of club or organization: Not on file    Attends meetings of clubs or organizations: Not on file    Relationship status: Not on file  Other Topics Concern  . Not on file  Social History Narrative   Adopted. Very little info on biological parents, ?diabetes.   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 Diabetes Maternal Grandmother     ROS:   Please see the history of present illness.  All other systems are reviewed and otherwise negative.    PHYSICAL EXAM:   VS:  BP 126/70   Pulse 71   Ht 5\' 5"  (1.651 m)   Wt 129.2 kg (284 lb 12.8 oz)   SpO2 96%   BMI 47.39 kg/m  BMI: Body mass index is 47.39 kg/m. GEN: Morbidly obesed 47 yo n no acute distress  HEENT: normocephalic, atraumatic Neck: Neck full  No obvious JVD Cardiac: RRR;  S1, S2   no murmurs, rubs, or gallops,  Trace bilateral LE edema  Respiratory:  clear to auscultation bilaterally, normal work of breathing GI: soft, nontender, distended, + BS MS: no deformity or atrophy  Skin: warm and dry, no rash Neuro:  Alert and Oriented x 3, Strength and sensation are intact, follows commands, much more lively Psych: euthymic mood, full affect  Wt Readings from Last 3 Encounters:  11/29/17 129.2 kg (284 lb 12.8 oz)  11/08/17 127.9 kg (282 lb)  10/19/17 127.8 kg (281 lb 12.8 oz)      Studies/Labs Reviewed:   EKG:  EKG was not ordered today   Recent Labs: 05/06/2017: NT-Pro BNP 95 05/13/2017: B Natriuretic Peptide 24.2; Hemoglobin 11.9; Platelets 211 05/14/2017: Magnesium 2.7 06/15/2017: ALT 64; BUN 17; Creatinine, Ser 1.20; Potassium 4.5; Sodium 141 11/02/2017: TSH 21.860   Lipid Panel    Component Value Date/Time   CHOL 104 09/18/2016 1028   TRIG 76 09/18/2016 1028   HDL 39 (L) 09/18/2016 1028   CHOLHDL 2.7 09/18/2016 1028   CHOLHDL 2.9 07/10/2016 0416   VLDL 18 07/10/2016 0416   LDLCALC 50 09/18/2016 1028    Additional studies/ records that were reviewed today include: Summarized above.    ASSESSMENT & PLAN:   Chronic diastolic CHF - Volume is not bad   Continue  meds    2  CAD - Asymptomatic   3  CKD  Will need to be followed  4  Hx WCT     Avoid drugs that prolong  QT  5  Hypothyroidism.   Synthroid was just  increased  WIll need to be followed  Says he is taking meds  6Erectile dysfunction - Takes sildenafil  No NTG  6  HL  WIll get lipdis  Only on Zetia     Disposition: F/U with me in October 2019     Medication Adjustments/Labs and Tests Ordered: Current medicines are reviewed at length with the patient today.  Concerns regarding medicines are outlined above. Medication changes, Labs and Tests ordered today are summarized above and listed in the Patient Instructions accessible in Encounters.   Signed, Dietrich PatesPaula Asjia Berrios, MD  11/29/2017 10:05 AM    [facility] [address], [place], [place]  [address] Phone: [phone]; Fax: [phone]

## 2017-11-29 NOTE — Patient Instructions (Signed)
Your physician recommends that you continue on your current medications as directed. Please refer to the Current Medication list given to you today. Your physician recommends that you return for lab work in: today (lipids)  Your physician wants you to follow-up in: October, 2019 with Dr. Tenny Crawoss. You will receive a reminder letter in the mail two months in advance. If you don't receive a letter, please call our office to schedule the follow-up appointment.

## 2017-12-01 ENCOUNTER — Telehealth: Payer: Self-pay | Admitting: Internal Medicine

## 2017-12-01 ENCOUNTER — Other Ambulatory Visit: Payer: Self-pay | Admitting: *Deleted

## 2017-12-01 ENCOUNTER — Other Ambulatory Visit: Payer: Self-pay

## 2017-12-01 DIAGNOSIS — E785 Hyperlipidemia, unspecified: Secondary | ICD-10-CM

## 2017-12-01 MED ORDER — TRUEPLUS LANCETS 28G MISC: 12 refills | Status: DC

## 2017-12-01 MED ORDER — ROSUVASTATIN CALCIUM 10 MG PO TABS: 10.0000 mg | ORAL_TABLET | Freq: Every day | ORAL | 3 refills | Status: DC

## 2017-12-01 MED FILL — TRUEplus LANCETS 28G MISC: 25 days supply | Qty: 100 | Fill #0

## 2017-12-01 NOTE — Telephone Encounter (Signed)
See lab results 11/29/17.

## 2017-12-01 NOTE — Telephone Encounter (Signed)
Follow up   Pt returning call about labs. Please call

## 2017-12-02 MED FILL — ROSUVASTATIN CALCIUM 10 MG: 10 | 30 days supply | Qty: 30 | Fill #0

## 2017-12-06 ENCOUNTER — Encounter: Payer: Self-pay | Admitting: Internal Medicine

## 2017-12-06 ENCOUNTER — Telehealth: Payer: Self-pay | Admitting: Internal Medicine

## 2017-12-06 MED ORDER — METOPROLOL TARTRATE 50 MG PO TABS: 50.0000 mg | ORAL_TABLET | Freq: Two times a day (BID) | ORAL | 3 refills | Status: DC

## 2017-12-06 MED FILL — POTASSIUM CL ER 20 MEQ TAB: 20 | 15 days supply | Qty: 60 | Fill #1

## 2017-12-06 MED FILL — ?OMEPRazole 20mg CPDR: 20 | 30 days supply | Qty: 30 | Fill #1

## 2017-12-06 MED FILL — ?METOPROLOL 50 MG TABLET: 50 | 30 days supply | Qty: 60 | Fill #0

## 2017-12-06 NOTE — Telephone Encounter (Signed)
Pt called stating that he was taking rosuvastatin 40 mg tablet and Dr. Tenny Crawoss supposed to have increased it but only rosuvastatin 10 mg tablet was sent in. Pt would like a call back concerning this matter. Please address

## 2017-12-07 MED ORDER — ROSUVASTATIN CALCIUM 40 MG PO TABS: 40.0000 mg | ORAL_TABLET | Freq: Every day | ORAL | 3 refills | Status: DC

## 2017-12-07 NOTE — Telephone Encounter (Signed)
I called the patient and he confirms with me the dose on his previous bottle of rosuvastatin is 40 mg. I sent prescription for 10 mg. I will send new prescription for correct dose and send to Dr. Tenny Crawoss to inform. We will still plan for lipids in 4 months.

## 2017-12-13 MED FILL — ROSUVASTATIN CALCIUM 40 MG: 40 | 90 days supply | Qty: 90 | Fill #0

## 2017-12-27 ENCOUNTER — Encounter: Payer: Self-pay | Admitting: Internal Medicine

## 2017-12-27 MED FILL — ?FUROSEMIDE 80MG TABLET: 80 | 30 days supply | Qty: 60 | Fill #7

## 2017-12-27 MED FILL — METFORMIN HCL ER 500 MG TAB: 500 | 30 days supply | Qty: 120 | Fill #9

## 2017-12-27 MED FILL — BUPROPION SR 150 MG TABLET: 150 | 30 days supply | Qty: 60 | Fill #1

## 2017-12-27 MED FILL — LEVOTHYROXINE 125 MCG TAB: 125 | 30 days supply | Qty: 30 | Fill #2

## 2017-12-27 MED FILL — $Viagra 50mg tablet: 50 | 30 days supply | Qty: 10 | Fill #0

## 2017-12-27 MED FILL — ?CLOPIDOGREL 75MG TAB: 75 | 30 days supply | Qty: 30 | Fill #1

## 2017-12-28 ENCOUNTER — Ambulatory Visit: Payer: Self-pay | Admitting: Sports Medicine

## 2018-01-21 ENCOUNTER — Encounter: Payer: Self-pay | Admitting: Sports Medicine

## 2018-01-28 ENCOUNTER — Ambulatory Visit: Payer: Self-pay | Attending: Family Medicine

## 2018-01-28 DIAGNOSIS — E785 Hyperlipidemia, unspecified: Secondary | ICD-10-CM | POA: Insufficient documentation

## 2018-01-28 DIAGNOSIS — E039 Hypothyroidism, unspecified: Secondary | ICD-10-CM | POA: Insufficient documentation

## 2018-01-28 NOTE — Progress Notes (Signed)
Patient here for lab visit only 

## 2018-01-29 LAB — LIPID PANEL
Chol/HDL Ratio: 2.9 ratio (ref 0.0–5.0)
Cholesterol, Total: 118 mg/dL (ref 100–199)
HDL: 41 mg/dL (ref >39)
Triglycerides: 412 mg/dL — ABNORMAL HIGH (ref 0–149)

## 2018-01-29 LAB — TSH: TSH: 20.92 u[IU]/mL — AB (ref 0.450–4.500)

## 2018-01-30 ENCOUNTER — Other Ambulatory Visit: Payer: Self-pay | Admitting: Internal Medicine

## 2018-01-30 ENCOUNTER — Encounter: Payer: Self-pay | Admitting: Internal Medicine

## 2018-01-30 MED ORDER — LEVOTHYROXINE SODIUM 150 MCG PO TABS: 150.0000 ug | ORAL_TABLET | Freq: Every day | ORAL | 4 refills | Status: DC

## 2018-01-31 ENCOUNTER — Ambulatory Visit: Payer: Self-pay | Admitting: Internal Medicine

## 2018-01-31 ENCOUNTER — Other Ambulatory Visit: Payer: Self-pay | Admitting: Internal Medicine

## 2018-01-31 MED FILL — ?FUROSEMIDE 80MG TABLET: 80 | 30 days supply | Qty: 60 | Fill #8

## 2018-01-31 MED FILL — ?CLOPIDOGREL 75MG TAB: 75 | 30 days supply | Qty: 30 | Fill #2

## 2018-01-31 MED FILL — BUPROPION SR 150 MG TABLET: 150 | 30 days supply | Qty: 60 | Fill #2

## 2018-01-31 MED FILL — LEVOTHYROXINE 150 MCG TAB: 150 | 30 days supply | Qty: 30 | Fill #0

## 2018-01-31 MED FILL — $Viagra 50mg tablet: 50 | 30 days supply | Qty: 10 | Fill #1

## 2018-01-31 MED FILL — TRUEplus LANCETS 28G MISC: 25 days supply | Qty: 100 | Fill #1

## 2018-01-31 MED FILL — ?METOPROLOL 50 MG TABLET: 50 | 30 days supply | Qty: 60 | Fill #1

## 2018-01-31 MED FILL — TRUE METRIX TEST STRIP: 30 days supply | Qty: 100 | Fill #6

## 2018-01-31 MED FILL — METFORMIN HCL ER 500 MG TAB: 500 | 30 days supply | Qty: 120 | Fill #10

## 2018-02-01 ENCOUNTER — Encounter: Payer: Self-pay | Admitting: Internal Medicine

## 2018-02-01 ENCOUNTER — Ambulatory Visit (INDEPENDENT_AMBULATORY_CARE_PROVIDER_SITE_OTHER): Payer: Self-pay | Admitting: Sports Medicine

## 2018-02-01 ENCOUNTER — Encounter: Payer: Self-pay | Admitting: Physician Assistant

## 2018-02-01 ENCOUNTER — Ambulatory Visit (INDEPENDENT_AMBULATORY_CARE_PROVIDER_SITE_OTHER): Payer: Self-pay | Admitting: Physician Assistant

## 2018-02-01 ENCOUNTER — Ambulatory Visit: Payer: Self-pay | Admitting: Sports Medicine

## 2018-02-01 ENCOUNTER — Ambulatory Visit: Payer: Self-pay | Attending: Internal Medicine | Admitting: Internal Medicine

## 2018-02-01 ENCOUNTER — Encounter: Payer: Self-pay | Admitting: Sports Medicine

## 2018-02-01 VITALS — BP 136/82 | HR 87 | Ht 65.0 in | Wt 284.1 lb

## 2018-02-01 VITALS — BP 136/92 | HR 85 | Temp 98.2°F | Resp 16 | Wt 284.6 lb

## 2018-02-01 DIAGNOSIS — I5032 Chronic diastolic (congestive) heart failure: Secondary | ICD-10-CM | POA: Insufficient documentation

## 2018-02-01 DIAGNOSIS — E1142 Type 2 diabetes mellitus with diabetic polyneuropathy: Secondary | ICD-10-CM

## 2018-02-01 DIAGNOSIS — I2510 Atherosclerotic heart disease of native coronary artery without angina pectoris: Secondary | ICD-10-CM

## 2018-02-01 DIAGNOSIS — K219 Gastro-esophageal reflux disease without esophagitis: Secondary | ICD-10-CM | POA: Insufficient documentation

## 2018-02-01 DIAGNOSIS — Z6841 Body Mass Index (BMI) 40.0 and over, adult: Secondary | ICD-10-CM | POA: Insufficient documentation

## 2018-02-01 DIAGNOSIS — N529 Male erectile dysfunction, unspecified: Secondary | ICD-10-CM | POA: Insufficient documentation

## 2018-02-01 DIAGNOSIS — G5603 Carpal tunnel syndrome, bilateral upper limbs: Secondary | ICD-10-CM | POA: Insufficient documentation

## 2018-02-01 DIAGNOSIS — E1165 Type 2 diabetes mellitus with hyperglycemia: Secondary | ICD-10-CM

## 2018-02-01 DIAGNOSIS — Z794 Long term (current) use of insulin: Secondary | ICD-10-CM | POA: Insufficient documentation

## 2018-02-01 DIAGNOSIS — E785 Hyperlipidemia, unspecified: Secondary | ICD-10-CM | POA: Insufficient documentation

## 2018-02-01 DIAGNOSIS — B351 Tinea unguium: Secondary | ICD-10-CM

## 2018-02-01 DIAGNOSIS — M65331 Trigger finger, right middle finger: Secondary | ICD-10-CM | POA: Insufficient documentation

## 2018-02-01 DIAGNOSIS — E039 Hypothyroidism, unspecified: Secondary | ICD-10-CM | POA: Insufficient documentation

## 2018-02-01 DIAGNOSIS — G4733 Obstructive sleep apnea (adult) (pediatric): Secondary | ICD-10-CM

## 2018-02-01 DIAGNOSIS — Z79899 Other long term (current) drug therapy: Secondary | ICD-10-CM | POA: Insufficient documentation

## 2018-02-01 DIAGNOSIS — I110 Hypertensive heart disease with heart failure: Secondary | ICD-10-CM | POA: Insufficient documentation

## 2018-02-01 DIAGNOSIS — IMO0002 Reserved for concepts with insufficient information to code with codable children: Secondary | ICD-10-CM

## 2018-02-01 DIAGNOSIS — M79609 Pain in unspecified limb: Secondary | ICD-10-CM

## 2018-02-01 DIAGNOSIS — Z833 Family history of diabetes mellitus: Secondary | ICD-10-CM | POA: Insufficient documentation

## 2018-02-01 DIAGNOSIS — Z7982 Long term (current) use of aspirin: Secondary | ICD-10-CM | POA: Insufficient documentation

## 2018-02-01 DIAGNOSIS — Z886 Allergy status to analgesic agent status: Secondary | ICD-10-CM | POA: Insufficient documentation

## 2018-02-01 DIAGNOSIS — E119 Type 2 diabetes mellitus without complications: Secondary | ICD-10-CM | POA: Insufficient documentation

## 2018-02-01 DIAGNOSIS — I10 Essential (primary) hypertension: Secondary | ICD-10-CM

## 2018-02-01 DIAGNOSIS — Z955 Presence of coronary angioplasty implant and graft: Secondary | ICD-10-CM | POA: Insufficient documentation

## 2018-02-01 DIAGNOSIS — Z87891 Personal history of nicotine dependence: Secondary | ICD-10-CM | POA: Insufficient documentation

## 2018-02-01 DIAGNOSIS — Z888 Allergy status to other drugs, medicaments and biological substances status: Secondary | ICD-10-CM | POA: Insufficient documentation

## 2018-02-01 DIAGNOSIS — E118 Type 2 diabetes mellitus with unspecified complications: Secondary | ICD-10-CM

## 2018-02-01 DIAGNOSIS — Z9889 Other specified postprocedural states: Secondary | ICD-10-CM | POA: Insufficient documentation

## 2018-02-01 LAB — GLUCOSE, POCT (MANUAL RESULT ENTRY): POC Glucose: 113 mg/dl — AB (ref 70–99)

## 2018-02-01 LAB — POCT GLYCOSYLATED HEMOGLOBIN (HGB A1C): HbA1c, POC (controlled diabetic range): 7.9 % — AB (ref 0.0–7.0)

## 2018-02-01 MED ORDER — INSULIN GLARGINE 100 UNIT/ML ~~LOC~~ SOLN
47.0000 [IU] | Freq: Two times a day (BID) | SUBCUTANEOUS | 2 refills | Status: DC
Start: 2018-02-01 — End: 2018-04-08

## 2018-02-01 MED ORDER — NOVOLOG 100 UNIT/ML ~~LOC~~ SOLN: SUBCUTANEOUS | 11 refills | Status: DC

## 2018-02-01 MED ORDER — ICOSAPENT ETHYL 1 G PO CAPS: 2.0000 g | ORAL_CAPSULE | Freq: Two times a day (BID) | ORAL | 3 refills | Status: DC

## 2018-02-01 MED ORDER — PANTOPRAZOLE SODIUM 40 MG PO TBEC: 40.0000 mg | DELAYED_RELEASE_TABLET | Freq: Every day | ORAL | 3 refills | Status: DC

## 2018-02-01 MED FILL — ?PANTOPRAZOLE SO DR 40MG TA: 40 | 30 days supply | Qty: 30 | Fill #0

## 2018-02-01 MED FILL — $novoLOG 100 UNITS/ML VIAL: 100 | 29 days supply | Qty: 20 | Fill #0

## 2018-02-01 MED FILL — $LANTUS 100 UNITS/ML VIAL: 100 | 31 days supply | Qty: 30 | Fill #0

## 2018-02-01 NOTE — Patient Instructions (Signed)
Medication Instructions:  1. STOP PRILOSEC  2. START PROTONIX 40 MG DAILY; RX HAS BEEN SENT IN  3. START VASCEPA 2 GM DAILY; NEW RX HAS BEEN SENT IN   Labwork: FASTING LIPID AND LIVER PANEL TO BE DONE IN 6 WEEK WITH PRIMARY CARE;  Testing/Procedures: NONE ORDERED TODAY  Follow-Up: 6 MONTHS WITH DR. ROSS   Any Other Special Instructions Will Be Listed Below (If Applicable).     If you need a refill on your cardiac medications before your next appointment, please call your pharmacy.

## 2018-02-01 NOTE — Progress Notes (Signed)
Cardiology Office Note:    Date:  02/01/2018   ID:  Marc Walker, DOB 1971-04-24, MRN [id number]  PCP:  Marcine Matar, MD  Cardiologist:  Dietrich Pates, MD  Sleep Med:  Dr. Armanda Magic   Referring MD: Marcine Matar, MD   Chief Complaint  Patient presents with  . Follow-up    CAD, CHF    History of Present Illness:    Marc Walker is a 47 y.o. male with CAD s/pprior myocardial infarction, s/p multipleprior stents placedat outside institutions, DM, HTN, HL, tobacco abuse, OSA, diastolic HF. He was admitted to [facility] in 3/17 with unstable angina. LHC demonstrated 80% mid RCA stenosis treated with DES, 90% proximal LAD and 70% mid LAD stenosis involving the previous stent treated with DES, 20% RPDA in-stent restenosis treated with angioplasty. The stent in OM2 was patent. Residual disease included 90% D2 stenosis and 70% mid LCx stenosis, treated medically.  Last seen by Dr. Tenny Craw in 11/2017.    [name] returns for follow up.  He is here alone.  He tells me he quit smoking 1 year ago.  He has not had any chest pain, significant shortness of breath.  He denies any orthopnea, paroxysmal nocturnal dyspnea, syncope.  He has some mild leg swelling without significant change.    Prior CV studies:   The following studies were reviewed today:  Echo 05/14/17 Mild conc LVH, EF 60-65, Gr 2 DD  Echocardiogram 07/10/16 Mild LVH, EF 60-65, Gr 2 DD, mild LAE; difficult study-no obvious wall motion abnormalities with Definity  Myoview 03/26/16  Low risk stress nuclear study with mild ischemia in the distribution of a distal OM or PLA branch. EF 55  LHC 09/16/15 LAD ostial stent ok, prox 90%, mid 70% ISR, D2 90% LCx mid 70%, OM2 stent ok RCA mid 80%, dist stent ok, RPDA ostial stent ok with 20% ISR,  PCI: 2.5 x 20 mm Synergy DES to prox/mid LAD; 3.25 x 18 mm Xience Alpine DES to mid RCA; Angiosculpt POBA to RPDA   Past Medical History:  Diagnosis Date  . Chronic  diastolic CHF (congestive heart failure) (HCC) 03/09/2017   Echo 1/18: Mild LVH, EF 60-65, Gr 2 DD, mild LAE; difficult study-no obvious wall motion abnormalities with Definity  . CKD (chronic kidney disease)   . Coronary artery disease    a. s/pprior myocardial infarction, s/p multipleprior stents placedat outside institutions with last intervention 08/2015 at [facility].  . Diabetes mellitus (HCC)   . Drug addiction in remission (HCC)   . Erectile dysfunction 03/02/2017  . Heartburn   . Hyperlipidemia associated with type 2 diabetes mellitus (HCC)   . Hypertension   . Hypothyroidism    a. adm with profound hypothyroidism near [facility] in 04/2017.  . MI (myocardial infarction) (HCC) 07/2009   [facility] in [place], [place] for last 2, 1st one in [place], [place]; total of 6 stents, last one 02/2014   . Morbid obesity (HCC) 07/06/2016  . Noncompliance with therapeutic plan    a. thyroid med -> profound hypothyroidism 04/2017.  Marc Walker OSA (obstructive sleep apnea) 07/06/2016   Moderate with AHI 21.8/hr by PSG 08/2012 now on CPAP  . Prolonged QT interval 05/14/2017  . Tobacco abuse   . Wide-complex tachycardia (HCC) 05/14/2017   Surgical Hx: The patient  has a past surgical history that includes Coronary stent placement; Cardiac catheterization (N/A, 09/16/2015); Cardiac catheterization (N/A, 09/16/2015); and Appendectomy.   Current Medications: Current Meds  Medication  Sig  . aspirin 81 MG tablet Take 1 tablet (81 mg total) by mouth daily.  . clobetasol (TEMOVATE) 0.05 % external solution Apply 1 application topically 2 (two) times daily. Use for 5 days then PRN  . clopidogrel (PLAVIX) 75 MG tablet Take 1 tablet (75 mg total) by mouth daily.  . empagliflozin (JARDIANCE) 10 MG TABS tablet Take 10 mg by mouth daily.  Marc Walker. ezetimibe (ZETIA) 10 MG tablet Take 1 tablet (10 mg total) by mouth daily.  . furosemide (LASIX) 80 MG tablet TAKE 1 TABLET 2 TIMES DAILY BY MOUTH  . glucose blood test strip USE AS  INSTRUCTED  . insulin glargine (LANTUS) 100 UNIT/ML injection Inject 0.47 mLs (47 Units total) into the skin 2 (two) times daily.  . Insulin Syringes, Disposable, U-100 0.5 ML MISC Use as directed  . ketoconazole (NIZORAL) 2 % shampoo Apply 1 application topically 2 (two) times a week.  . levothyroxine (SYNTHROID, LEVOTHROID) 150 MCG tablet Take 1 tablet (150 mcg total) by mouth daily.  . metFORMIN (GLUCOPHAGE) 1000 MG tablet Take 1,000 mg by mouth 2 (two) times daily with a meal.  . metoprolol tartrate (LOPRESSOR) 50 MG tablet Take 1 tablet (50 mg total) by mouth 2 (two) times daily.  . nitroGLYCERIN (NITROSTAT) 0.4 MG SL tablet Place 1 tablet (0.4 mg total) under the tongue every 5 (five) minutes as needed for chest pain.  Marc Walker. NOVOLOG 100 UNIT/ML injection 20 units subcut with breakfast and 24 units with lunch and dinner.  Marc Walker. omeprazole (PRILOSEC) 20 MG capsule Take 1 capsule (20 mg total) by mouth daily.  . potassium chloride SA (K-DUR,KLOR-CON) 20 MEQ tablet Take 40 mEq by mouth 2 (two) times daily.  . rosuvastatin (CRESTOR) 40 MG tablet Take 1 tablet (40 mg total) by mouth daily.  . sildenafil (VIAGRA) 50 MG tablet TAKE 1 TABLET (50 MG TOTAL) BY MOUTH DAILY AS NEEDED FOR ERECTILE DYSFUNCTION.  Marc Walker. TRUEPLUS LANCETS 28G MISC Use as directed     Allergies:   Levofloxacin; Nsaids; and Other   Social History   Tobacco Use  . Smoking status: Former Smoker    Last attempt to quit: 01/31/2017    Years since quitting: 1.0  . Smokeless tobacco: Never Used  . Tobacco comment: quit 01/2017  Substance Use Topics  . Alcohol use: No    Alcohol/week: 0.0 oz  . Drug use: No     Family Hx: The patient's family history includes Diabetes in his maternal grandmother. He was adopted.  ROS:   Please see the history of present illness.    Review of Systems  Eyes: Positive for visual disturbance.  Respiratory: Positive for snoring.    All other systems reviewed and are negative.   EKGs/Labs/Other Test  Reviewed:    EKG:  EKG is  ordered today.  The ekg ordered today demonstrates normal sinus rhythm, heart rate 83, normal axis, QTC 467, similar to prior tracings  Recent Labs: 05/06/2017: NT-Pro BNP 95 05/13/2017: B Natriuretic Peptide 24.2; Hemoglobin 11.9; Platelets 211 05/14/2017: Magnesium 2.7 06/15/2017: ALT 64; BUN 17; Creatinine, Ser 1.20; Potassium 4.5; Sodium 141 01/28/2018: TSH 20.920   Recent Lipid Panel Lab Results  Component Value Date/Time   CHOL 118 01/28/2018 10:38 AM   TRIG 412 (H) 01/28/2018 10:38 AM   HDL 41 01/28/2018 10:38 AM   CHOLHDL 2.9 01/28/2018 10:38 AM   CHOLHDL 2.9 07/10/2016 04:16 AM   LDLCALC Comment 01/28/2018 10:38 AM    Physical Exam:    VS:  BP 136/82   Pulse 87   Ht 5\' 5"  (1.651 m)   Wt 284 lb 1.9 oz (128.9 kg)   SpO2 96%   BMI 47.28 kg/m     Wt Readings from Last 3 Encounters:  02/01/18 284 lb 1.9 oz (128.9 kg)  02/01/18 284 lb 9.6 oz (129.1 kg)  11/29/17 284 lb 12.8 oz (129.2 kg)     Physical Exam  Constitutional: He is oriented to person, place, and time. He appears well-developed and well-nourished. No distress.  HENT:  Head: Normocephalic and atraumatic.  Eyes: No scleral icterus.  Neck: Neck supple. No JVD present.  Cardiovascular: Normal rate, regular rhythm, S1 normal and S2 normal.  No murmur heard. Pulmonary/Chest: Breath sounds normal. He has no rales.  Abdominal: Soft. There is no hepatomegaly.  Musculoskeletal: He exhibits no edema.  Neurological: He is alert and oriented to person, place, and time.  Skin: Skin is warm and dry.  Psychiatric: He has a normal mood and affect.    ASSESSMENT & PLAN:    Coronary artery disease involving native coronary artery of native heart without angina pectoris History of multiple percutaneous coronary interventions in the past.  Nuclear stress test in 2017 was low risk.  He denies any angina on his current medical regimen.  He remains on dual antiplatelet therapy with aspirin and  clopidogrel.  As noted, he has quit smoking and just hit his 1 year anniversary.  Due to interactions between omeprazole and clopidogrel, I have recommended that we change his therapy to pantoprazole.  -Continue aspirin, clopidogrel, Zetia, rosuvastatin, beta-blocker  -DC omeprazole  -Start pantoprazole 40 mg daily  Chronic diastolic CHF (congestive heart failure) (HCC) Echo 11/18 with normal LV function and moderate diastolic dysfunction.  NYHA 2.  Volume status appears stable.  Continue current regimen.  Essential hypertension The patient's blood pressure is controlled on his current regimen.  Continue current therapy.   Morbid obesity (HCC) He has lost about 15 pounds since last year.  I have encouraged him to continue to pursue weight loss.  Hyperlipidemia LDL goal <70  Recent lipid panel demonstrated triglycerides over 400.  Dr. Tenny Craw recommended adding Vascepa 2 gm bid.    -Start Vascepa 2 gm Twice daily   -Lipids and LFTs in 6 weeks  Hypothyroidism, unspecified type Recent TSH over 20.  His PCP just adjusted his Synthroid.  His triglycerides may be impacted by uncontrolled hypothyroidism.  Hopefully, as his thyroid is better controlled, his lipid panel will improve.  OSA (obstructive sleep apnea) Continue CPAP.     Dispo:  Return in about 6 months (around 08/04/2018) for Routine Follow Up, w/ Dr. Tenny Craw.   Medication Adjustments/Labs and Tests Ordered: Current medicines are reviewed at length with the patient today.  Concerns regarding medicines are outlined above.  Tests Ordered: Orders Placed This Encounter  Procedures  . Lipid Profile  . Hepatic function panel  . EKG 12-Lead   Medication Changes: Meds ordered this encounter  Medications  . Icosapent Ethyl (VASCEPA) 1 g CAPS    Sig: Take 2 capsules (2 g total) by mouth 2 (two) times daily.    Dispense:  360 capsule    Refill:  3  . pantoprazole (PROTONIX) 40 MG tablet    Sig: Take 1 tablet (40 mg total) by mouth  daily.    Dispense:  90 tablet    Refill:  3    CHANGE IN THERAPY ; D/C PRILOSEC    Signed, [name], PA-C  02/01/2018 1:23 PM    [facility] [address] [place], [place], [place]  [address] Phone: [phone]; Fax: [phone]

## 2018-02-01 NOTE — Progress Notes (Signed)
Subjective: Marc Walker is a 47 y.o. male patient with history of diabetes who presents to office today complaining of long, painful nails  while ambulating in shoes; unable to trim. Patient states that the glucose reading this morning was not recorded. Last A1c 7.5 and reports that he has stopped smoking 1 year ago. Patient denies any new changes in medication or new problems. Reports he has other doctors appointments today.  Patient Active Problem List   Diagnosis Date Noted  . CAD S/P multiple PCI's 08/09/2017  . Prolonged QT interval 05/14/2017  . Wide-complex tachycardia (HCC) 05/14/2017  . GERD (gastroesophageal reflux disease) 05/13/2017  . Hypothyroidism   . Bilateral carpal tunnel syndrome 04/08/2017  . Chronic diastolic CHF (congestive heart failure) (HCC) 03/09/2017  . Erectile dysfunction 03/02/2017  . OSA on CPAP 07/06/2016  . Morbid obesity (HCC) 07/06/2016  . DM type 2 with diabetic peripheral neuropathy (HCC) 12/16/2015  . CAD (coronary artery disease) 09/30/2015  . Essential hypertension 09/16/2015  . Hyperlipidemia    Current Outpatient Medications on File Prior to Visit  Medication Sig Dispense Refill  . aspirin 81 MG tablet Take 1 tablet (81 mg total) by mouth daily. 90 tablet 3  . clobetasol (TEMOVATE) 0.05 % external solution Apply 1 application topically 2 (two) times daily. Use for 5 days then PRN 50 mL 0  . clopidogrel (PLAVIX) 75 MG tablet Take 1 tablet (75 mg total) by mouth daily. 90 tablet 3  . empagliflozin (JARDIANCE) 10 MG TABS tablet Take 10 mg by mouth daily. 90 tablet 3  . ezetimibe (ZETIA) 10 MG tablet Take 1 tablet (10 mg total) by mouth daily. 90 tablet 3  . furosemide (LASIX) 80 MG tablet TAKE 1 TABLET 2 TIMES DAILY BY MOUTH  3  . glucose blood test strip USE AS INSTRUCTED  12  . Insulin Syringes, Disposable, U-100 0.5 ML MISC Use as directed 100 each 11  . ketoconazole (NIZORAL) 2 % shampoo Apply 1 application topically 2 (two) times a week. 120  mL 0  . LANTUS 100 UNIT/ML injection INJECT 45 UNITS TOTAL INTO THE SKIN 2 TIMES DAILY. 30 mL 2  . levothyroxine (SYNTHROID, LEVOTHROID) 150 MCG tablet Take 1 tablet (150 mcg total) by mouth daily. 30 tablet 4  . metFORMIN (GLUCOPHAGE) 1000 MG tablet Take 1,000 mg by mouth 2 (two) times daily with a meal.    . metoprolol tartrate (LOPRESSOR) 50 MG tablet Take 1 tablet (50 mg total) by mouth 2 (two) times daily. 180 tablet 3  . nitroGLYCERIN (NITROSTAT) 0.4 MG SL tablet Place 1 tablet (0.4 mg total) under the tongue every 5 (five) minutes as needed for chest pain. 30 tablet 12  . NOVOLOG 100 UNIT/ML injection INJECT 20 UNITS INTO THE SKIN 3  THREE  TIMES DAILY BEFORE MEALS  11  . omeprazole (PRILOSEC) 20 MG capsule Take 1 capsule (20 mg total) by mouth daily. 30 capsule 6  . potassium chloride SA (K-DUR,KLOR-CON) 20 MEQ tablet Take 40 mEq by mouth 2 (two) times daily.  2  . rosuvastatin (CRESTOR) 40 MG tablet Take 1 tablet (40 mg total) by mouth daily. 90 tablet 3  . sildenafil (VIAGRA) 50 MG tablet TAKE 1 TABLET (50 MG TOTAL) BY MOUTH DAILY AS NEEDED FOR ERECTILE DYSFUNCTION. 10 tablet 3  . TRUEPLUS LANCETS 28G MISC Use as directed 100 each 12   No current facility-administered medications on file prior to visit.    Allergies  Allergen Reactions  . Levofloxacin Rash  Rash on back (not sun exposed)and hypersensitivity to skin on exposed skin/arm Rash on back (not sun exposed)and hypersensitivity to skin on exposed skin/arm  . Nsaids     Avoid due to CKD  . 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  Pt only wants pain medicine if absolutely necessary.    Recent Results (from the past 2160 hour(s))  POCT glucose (manual entry)     Status: Abnormal   Collection Time: 11/08/17  9:11 AM  Result Value Ref Range   POC Glucose 237 (A) 70 - 99 mg/dl  Lipid panel     Status: Abnormal   Collection Time: 11/29/17 10:27 AM  Result Value Ref Range   Cholesterol, Total 101  100 - 199 mg/dL   Triglycerides 161 (H) 0 - 149 mg/dL   HDL 40 >09 mg/dL   VLDL Cholesterol Cal 69 (H) 5 - 40 mg/dL   LDL Calculated Comment (A) 0 - 99 mg/dL    Comment: Negative value obtained on calculation. Call laboratory to verify component results.    Chol/HDL Ratio 2.5 0.0 - 5.0 ratio    Comment:                                   T. Chol/HDL Ratio                                             Men  Women                               1/2 Avg.Risk  3.4    3.3                                   Avg.Risk  5.0    4.4                                2X Avg.Risk  9.6    7.1                                3X Avg.Risk 23.4   11.0   Lipid panel     Status: Abnormal   Collection Time: 01/28/18 10:38 AM  Result Value Ref Range   Cholesterol, Total 118 100 - 199 mg/dL   Triglycerides 604 (H) 0 - 149 mg/dL   HDL 41 >54 mg/dL   VLDL Cholesterol Cal Comment 5 - 40 mg/dL    Comment: The calculation for the VLDL cholesterol is not valid when triglyceride level is >400 mg/dL.    LDL Calculated Comment 0 - 99 mg/dL    Comment: Triglyceride result indicated is too high for an accurate LDL cholesterol estimation.    Chol/HDL Ratio 2.9 0.0 - 5.0 ratio    Comment:                                   T. Chol/HDL Ratio  Men  Women                               1/2 Avg.Risk  3.4    3.3                                   Avg.Risk  5.0    4.4                                2X Avg.Risk  9.6    7.1                                3X Avg.Risk 23.4   11.0   TSH     Status: Abnormal   Collection Time: 01/28/18 10:38 AM  Result Value Ref Range   TSH 20.920 (H) 0.450 - 4.500 uIU/mL    Objective: General: Patient is awake, alert, and oriented x 3 and in no acute distress.  Integument: Skin is warm, dry and supple bilateral. Nails are tender, long, thickened and dystrophic with subungual debris, consistent with onychomycosis, 1-5 bilateral. No signs of infection. No  open lesions or preulcerative lesions present bilateral. Remaining integument unremarkable.  Vasculature:  Dorsalis Pedis pulse 1/4 bilateral. Posterior Tibial pulse  1/4 bilateral. Capillary fill time <3 sec 1-5 bilateral. Positive hair growth to the level of the digits.Temperature gradient within normal limits. + varicosities present bilateral. No edema present bilateral.   Neurology: The patient has intact sensation measured with a 5.07/10g Semmes Weinstein Monofilament at all pedal sites bilateral. Vibratory sensation diminished bilateral with tuning fork. No Babinski sign present bilateral. Subjective tingling bilateral.   Musculoskeletal: Asymptomatic pes planus pedal deformities noted bilateral. Muscular strength 5/5 in all lower extremity muscular groups bilateral without pain on range of motion . No tenderness with calf compression bilateral.  Assessment and Plan: Problem List Items Addressed This Visit    None    Visit Diagnoses    Pain due to onychomycosis of nail    -  Primary   Diabetic polyneuropathy associated with type 2 diabetes mellitus (HCC)         -Examined patient. -Discussed and educated patient on diabetic foot care, especially with regards to the vascular, neurological and musculoskeletal systems.  -Stressed the importance of good glycemic control and the detriment of not controlling glucose levels in relation to the foot. -Mechanically debrided all nails 1-5 bilateral using sterile nail nipper and filed with dremel without incident -Patient to return  in 3 months for at risk foot care -Patient advised to call the office if any problems or questions arise in the meantime.  Asencion Islam, DPM

## 2018-02-01 NOTE — Progress Notes (Signed)
Patient ID: Marc Walker, male    DOB: 09/10/1970  MRN: [id number]  CC: Diabetes and Hypertension   Subjective: Marc Walker is a 47 y.o. male who presents for chronic ds management His concerns today include:  47 year old male with history of CAD (8stents), DM type II, HL, HTN, obesity, OSA on CPAP, tobacco dependence, BPH, hypothyroid.  Tob dep: free of cig x 1 yr.  Hypothyroid:  Repeat TSH still not in range.  He reports compliance with taking the medication.  We increased levothyroxine from 125mcg to 150 mcg.  He will return to the lab in 6 weeks for repeat level check.  OSA: He is now using his CPAP consistently every night since the rash on his scalp has resolved.  DM:   Eating habits:  "I'm doing good 97% of the time.  I've never been big on sweets."  Loves potatoes and grits.   -checks BS before meals and at nights.  Did not bring logs.  A.m range in the 140-160s, evening 180s and bedtime 260s.  Med: compliant with Lantus 45 units twice daily, NovoLog 20 units 3 times daily with meals, Jardiance and metformin Sedentary lifestyle.  Hopes to get ino the [employer] within the next 2 wks.   HTN/CAD:  Checks BP every day.  Gives range 120s/60-80s Compliant with meds.  Uses Ms. DASH for seasoning No CP.  + SOB with exertion  Complains of intermittent locking of the right middle finger in a flexed position several times a day for the past few months.   Patient Active Problem List   Diagnosis Date Noted  . CAD S/P multiple PCI's 08/09/2017  . Prolonged QT interval 05/14/2017  . Wide-complex tachycardia (HCC) 05/14/2017  . GERD (gastroesophageal reflux disease) 05/13/2017  . Hypothyroidism   . Bilateral carpal tunnel syndrome 04/08/2017  . Chronic diastolic CHF (congestive heart failure) (HCC) 03/09/2017  . Erectile dysfunction 03/02/2017  . OSA on CPAP 07/06/2016  . Morbid obesity (HCC) 07/06/2016  . DM type 2 with diabetic peripheral neuropathy (HCC) 12/16/2015  . CAD  (coronary artery disease) 09/30/2015  . Essential hypertension 09/16/2015  . Hyperlipidemia      Current Outpatient Medications on File Prior to Visit  Medication Sig Dispense Refill  . aspirin 81 MG tablet Take 1 tablet (81 mg total) by mouth daily. 90 tablet 3  . clobetasol (TEMOVATE) 0.05 % external solution Apply 1 application topically 2 (two) times daily. Use for 5 days then PRN 50 mL 0  . clopidogrel (PLAVIX) 75 MG tablet Take 1 tablet (75 mg total) by mouth daily. 90 tablet 3  . empagliflozin (JARDIANCE) 10 MG TABS tablet Take 10 mg by mouth daily. 90 tablet 3  . ezetimibe (ZETIA) 10 MG tablet Take 1 tablet (10 mg total) by mouth daily. 90 tablet 3  . furosemide (LASIX) 80 MG tablet TAKE 1 TABLET 2 TIMES DAILY BY MOUTH  3  . glucose blood test strip USE AS INSTRUCTED  12  . Insulin Syringes, Disposable, U-100 0.5 ML MISC Use as directed 100 each 11  . ketoconazole (NIZORAL) 2 % shampoo Apply 1 application topically 2 (two) times a week. 120 mL 0  . levothyroxine (SYNTHROID, LEVOTHROID) 150 MCG tablet Take 1 tablet (150 mcg total) by mouth daily. 30 tablet 4  . metFORMIN (GLUCOPHAGE) 1000 MG tablet Take 1,000 mg by mouth 2 (two) times daily with a meal.    . metoprolol tartrate (LOPRESSOR) 50 MG tablet Take 1 tablet (50 mg total)  by mouth 2 (two) times daily. 180 tablet 3  . nitroGLYCERIN (NITROSTAT) 0.4 MG SL tablet Place 1 tablet (0.4 mg total) under the tongue every 5 (five) minutes as needed for chest pain. 30 tablet 12  . omeprazole (PRILOSEC) 20 MG capsule Take 1 capsule (20 mg total) by mouth daily. 30 capsule 6  . potassium chloride SA (K-DUR,KLOR-CON) 20 MEQ tablet Take 40 mEq by mouth 2 (two) times daily.  2  . rosuvastatin (CRESTOR) 40 MG tablet Take 1 tablet (40 mg total) by mouth daily. 90 tablet 3  . sildenafil (VIAGRA) 50 MG tablet TAKE 1 TABLET (50 MG TOTAL) BY MOUTH DAILY AS NEEDED FOR ERECTILE DYSFUNCTION. 10 tablet 3  . TRUEPLUS LANCETS 28G MISC Use as directed 100  each 12   No current facility-administered medications on file prior to visit.     Allergies  Allergen Reactions  . Levofloxacin Rash    Rash on back (not sun exposed)and hypersensitivity to skin on exposed skin/arm Rash on back (not sun exposed)and hypersensitivity to skin on exposed skin/arm  . Nsaids     Avoid due to CKD  . 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  Pt only wants pain medicine if absolutely necessary.    Social History   Socioeconomic History  . Marital status: Divorced    Spouse name: Not on file  . Number of children: Not on file  . Years of education: Not on file  . Highest education level: Not on file  Occupational History  . Occupation: Company secretary  Social Needs  . Financial resource strain: Not on file  . Food insecurity:    Worry: Not on file    Inability: Not on file  . Transportation needs:    Medical: Not on file    Non-medical: Not on file  Tobacco Use  . Smoking status: Former Smoker    Last attempt to quit: 01/31/2017    Years since quitting: 1.0  . Smokeless tobacco: Never Used  . Tobacco comment: quit 01/2017  Substance and Sexual Activity  . Alcohol use: No    Alcohol/week: 0.0 oz  . Drug use: No  . Sexual activity: Not on file  Lifestyle  . Physical activity:    Days per week: Not on file    Minutes per session: Not on file  . Stress: Not on file  Relationships  . Social connections:    Talks on phone: Not on file    Gets together: Not on file    Attends religious service: Not on file    Active member of club or organization: Not on file    Attends meetings of clubs or organizations: Not on file    Relationship status: Not on file  . Intimate partner violence:    Fear of current or ex partner: Not on file    Emotionally abused: Not on file    Physically abused: Not on file    Forced sexual activity: Not on file  Other Topics Concern  . Not on file  Social History Narrative   Adopted. Very  little info on biological parents, ?diabetes.   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 Diabetes Maternal Grandmother     Past Surgical History:  Procedure Laterality Date  . APPENDECTOMY    . CARDIAC CATHETERIZATION N/A 09/16/2015   Procedure: Left  Heart Cath and Coronary Angiography;  Surgeon: Runell Gess, MD;  Location: [facility] INVASIVE CV LAB;  Service: Cardiovascular;  Laterality: N/A;  . CARDIAC CATHETERIZATION N/A 09/16/2015   Procedure: Coronary Stent Intervention;  Surgeon: Runell Gess, MD;  Location: [facility] INVASIVE CV LAB;  Service: Cardiovascular;  Laterality: N/A;  . CORONARY STENT PLACEMENT      ROS: Review of Systems Negative except as stated above. PHYSICAL EXAM: BP (!) 136/92   Pulse 85   Temp 98.2 F (36.8 C) (Oral)   Resp 16   Wt 284 lb 9.6 oz (129.1 kg)   SpO2 95%   BMI 47.36 kg/m    Wt Readings from Last 3 Encounters:  02/01/18 284 lb 1.9 oz (128.9 kg)  02/01/18 284 lb 9.6 oz (129.1 kg)  11/29/17 284 lb 12.8 oz (129.2 kg)   Physical Exam  General appearance - alert, well appearing, and in no distress Mental status - normal mood, behavior, speech, dress, motor activity, and thought processes Neck - supple, no significant adenopathy Chest - clear to auscultation, no wheezes, rales or rhonchi, symmetric air entry Heart - normal rate, regular rhythm, normal S1, S2, no murmurs, rubs, clicks or gallops Musculoskeletal -hands: No erythema or edema.. Extremities -trace lower extremity edema.  Results for orders placed or performed in visit on 02/01/18  POCT glucose (manual entry)  Result Value Ref Range   POC Glucose 113 (A) 70 - 99 mg/dl  POCT glycosylated hemoglobin (Hb A1C)  Result Value Ref Range   Hemoglobin A1C  4.0 - 5.6 %   HbA1c POC (<> result, manual entry)  4.0 - 5.6 %   HbA1c, POC (prediabetic range)  5.7 - 6.4 %   HbA1c, POC (controlled diabetic range) 7.9 (A) 0.0 - 7.0 %     ASSESSMENT AND PLAN: 1. Diabetes mellitus type 2, uncontrolled, with complications (HCC) Dietary counseling given.  He will try to cut back on some of the white carbohydrates. Increase Lantus to 47 units twice a day and change NovoLog to 20 units with breakfast and 24 units with lunch and dinner. Him to try to move more.  He will be joining the [facility] within the next 2 weeks. - POCT glucose (manual entry) - POCT glycosylated hemoglobin (Hb A1C) - insulin glargine (LANTUS) 100 UNIT/ML injection; Inject 0.47 mLs (47 Units total) into the skin 2 (two) times daily.  Dispense: 30 mL; Refill: 2 - NOVOLOG 100 UNIT/ML injection; 20 units subcut with breakfast and 24 units with lunch and dinner.  Dispense: 10 mL; Refill: 11 - CBC; Future - Comprehensive metabolic panel; Future  2. Essential hypertension Diastolic blood pressure above goal.  Patient to check blood pressure at least once a week at home and bring in readings.  Continue current medications for now.  3. Coronary artery disease involving native coronary artery of native heart without angina pectoris Clinically stable.  Has appointment with his cardiologist today.  4. Trigger middle finger of right hand Recommend referral to orthopedics.  He still has the [facility] discount at which time he can submit the referral.  5. Class 3 severe obesity due to excess calories with serious comorbidity and body mass index (BMI) of 45.0 to 49.9 in adult [facility]) See #1 above.  6. Hypothyroidism, unspecified type - TSH; Future   Patient was given the opportunity to ask questions.  Patient verbalized understanding of the plan and was able to repeat key elements of the plan.   Orders Placed This Encounter  Procedures  .  TSH  . CBC  . Comprehensive metabolic panel  . POCT glucose (manual entry)  . POCT glycosylated hemoglobin (Hb A1C)     Requested Prescriptions   Signed Prescriptions Disp Refills  . insulin glargine (LANTUS) 100 UNIT/ML  injection 30 mL 2    Sig: Inject 0.47 mLs (47 Units total) into the skin 2 (two) times daily.  Marland Kitchen NOVOLOG 100 UNIT/ML injection 10 mL 11    Sig: 20 units subcut with breakfast and 24 units with lunch and dinner.    Return in about 3 months (around 05/04/2018).  Jonah Blue, MD, FACP

## 2018-02-01 NOTE — Patient Instructions (Signed)
Increase Lantus to 47 units twice a day.  Change Novolog to 20 units with breakfast and 24 units with lunch and dinner.   Trigger Finger Trigger finger (stenosing tenosynovitis) is a condition that causes a finger to get stuck in a bent position. Each finger has a tough, cord-like tissue that connects muscle to bone (tendon), and each tendon is surrounded by a tunnel of tissue (tendon sheath). To move your finger, your tendon needs to slide freely through the sheath. Trigger finger happens when the tendon or the sheath thickens, making it difficult to move your finger. Trigger finger can affect any finger or a thumb. It may affect more than one finger. Mild cases may clear up with rest and medicine. Severe cases require more treatment. What are the causes? Trigger finger is caused by a thickened finger tendon or tendon sheath. The cause of this thickening is not known. What increases the risk? The following factors may make you more likely to develop this condition:  Doing activities that require a strong grip.  Having rheumatoid arthritis, gout, or diabetes.  Being 9140-49 years old.  Being a woman.  What are the signs or symptoms? Symptoms of this condition include:  Pain when bending or straightening your finger.  Tenderness or swelling where your finger attaches to the palm of your hand.  A lump in the palm of your hand or on the inside of your finger.  Hearing a popping sound when you try to straighten your finger.  Feeling a popping, catching, or locking sensation when you try to straighten your finger.  Being unable to straighten your finger.  How is this diagnosed? This condition is diagnosed based on your symptoms and a physical exam. How is this treated? This condition may be treated by:  Resting your finger and avoiding activities that make symptoms worse.  Wearing a finger splint to keep your finger in a slightly bent position.  Taking NSAIDs to relieve pain and  swelling.  Injecting medicine (steroids) into the tendon sheath to reduce swelling and irritation. Injections may need to be repeated.  Having surgery to open the tendon sheath. This may be done if other treatments do not work and you cannot straighten your finger. You may need physical therapy after surgery.  Follow these instructions at home:  Use moist heat to help reduce pain and swelling as told by your health care provider.  Rest your finger and avoid activities that make pain worse. Return to normal activities as told by your health care provider.  If you have a splint, wear it as told by your health care provider.  Take over-the-counter and prescription medicines only as told by your health care provider.  Keep all follow-up visits as told by your health care provider. This is important. Contact a health care provider if:  Your symptoms are not improving with home care. Summary  Trigger finger (stenosing tenosynovitis) causes your finger to get stuck in a bent position, and it can make it difficult and painful to straighten your finger.  This condition develops when a finger tendon or tendon sheath thickens.  Treatment starts with resting, wearing a splint, and taking NSAIDs.  In severe cases, surgery to open the tendon sheath may be needed. This information is not intended to replace advice given to you by your health care provider. Make sure you discuss any questions you have with your health care provider. Document Released: 04/04/2004 Document Revised: 05/26/2016 Document Reviewed: 05/26/2016 Elsevier Interactive Patient Education  2017 Elsevier Inc.  

## 2018-02-02 MED FILL — OMEGA-3 ETHYL ESTERS 1 GM C: 1 | 30 days supply | Qty: 60 | Fill #0

## 2018-02-03 ENCOUNTER — Encounter: Payer: Self-pay | Admitting: Gastroenterology

## 2018-02-03 ENCOUNTER — Telehealth: Payer: Self-pay | Admitting: *Deleted

## 2018-02-03 DIAGNOSIS — R12 Heartburn: Secondary | ICD-10-CM

## 2018-02-03 MED ORDER — FAMOTIDINE 40 MG PO TABS: 40.0000 mg | ORAL_TABLET | Freq: Every day | ORAL | Status: DC

## 2018-02-03 NOTE — Telephone Encounter (Signed)
I s/w pt today and went over the new recommendation to start Pepcid 20 mg BID. Pt states that does not work either for him. He states the only thing that worked for his is Omeprazole, which he cannot take because he is on Plavix. Pt asked for me to pleased ask Marc NewcomerScott Weaver, PA what else can he do? Advised PA is out of the of the office today. [facility] [place] [facility] [place] [facility] [place] his Plav[place] and said if Lorin PicketScott could call something else in that he thinks will work. Advised I can send him a message, though he might not get back to me until tomorrow. Pt thanked me for the call.

## 2018-02-03 NOTE — Telephone Encounter (Signed)
Try Protonix 40 mg Twice daily and take Pepcid 40 mg at bedtime. If he does not already see Gastroenterology, refer to Gastroenterology. Tereso NewcomerScott Diahn Waidelich, PA-C    02/03/2018 11:04 AM

## 2018-02-03 NOTE — Telephone Encounter (Signed)
S/w pt and went over medication recommendations. Pt is agreeable to start Protonix 40 mg BID as well as take Pepcid 40 mg at bed time. Pt said he will pick up Pepcid OTC today. Asked pt if he see;s GI already, pt answered no. Pt advised we will refer him to [facility] and their office will call him with an appt. Pt thanked me for the call and our help.

## 2018-02-03 NOTE — Telephone Encounter (Signed)
-----   Message from Beatrice LecherScott T Weaver, [place]PA-C sent at 02/02/2018  5:25 PM EDT ----- Pepcid 20 mg Twice daily  Tereso NewcomerScott Weaver, PA-C    02/02/2018 5:25 PM

## 2018-02-08 ENCOUNTER — Ambulatory Visit (INDEPENDENT_AMBULATORY_CARE_PROVIDER_SITE_OTHER): Payer: Self-pay | Admitting: Gastroenterology

## 2018-02-08 ENCOUNTER — Telehealth: Payer: Self-pay

## 2018-02-08 ENCOUNTER — Encounter: Payer: Self-pay | Admitting: Gastroenterology

## 2018-02-08 VITALS — BP 124/86 | HR 80 | Ht 65.0 in | Wt 282.0 lb

## 2018-02-08 DIAGNOSIS — K219 Gastro-esophageal reflux disease without esophagitis: Secondary | ICD-10-CM

## 2018-02-08 NOTE — Telephone Encounter (Signed)
Pt just seen by Tereso NewcomerScott Weaver 02/01/18- will as Dr Tenny Crawoss about holding Plavix, otherwise he should be cleared fro cardiology standpoint.   Corine ShelterLUKE Wilberta Dorvil PA-C 02/08/2018 4:50 PM

## 2018-02-08 NOTE — Telephone Encounter (Signed)
OK to stop plavix 5 days prior  Resume if safe from a GI standpoint after

## 2018-02-08 NOTE — Progress Notes (Signed)
Chief Complaint: Gastroesophageal reflux  Referring Provider:  [name], [name], MD      ASSESSMENT AND PLAN;   #1.  Refractory GERD despite PPIs - Protonix 40mg  po qAM. - Pepcid 40mg  po qHS. - EGD after cardiology clearence to hold plavix x 5 days prior.  - If still with problems, solid phase GES to r/o associated diabetic gastroparesis. - Has quit smoking 2018. - Nonpharmacologic means of reflux control.   #2.  Comorbid conditions-including obesity, CAD, d CHF (Nl EF), DM, sleep apnea on CPAP, HLD and hypothyroidism. -Encouraged him to continue to lose weight gradually (lost 15 pounds over the last year)   HPI:    Marc Walker is a 47 y.o. male  Heartburn x 20 yrs. Worse after eating. No dysphagia. Has quit smoking last year.  Never had any GI evaluation. Denies any history of nonsteroidal use or significant coffee intake. No nausea, vomiting. No significant diarrhea or constipation.  There is no melena or hematochezia. No unintentional weight loss. Has sleep apnea on CPAP. His diabetes is under good control. Has been taking omeprazole but was discontinued due to " interaction with Plavix".  Had good relief.  He was thereafter started on Protonix and then Pepcid was added.  Still having some breakthrough symptoms.    Past Medical History:  Diagnosis Date  . Chronic diastolic CHF (congestive heart failure) (HCC) 03/09/2017   Echo 1/18: Mild LVH, EF 60-65, Gr 2 DD, mild LAE; difficult study-no obvious wall motion abnormalities with Definity  . CKD (chronic kidney disease)   . Coronary artery disease    a. s/pprior myocardial infarction, s/p multipleprior stents placedat outside institutions with [facility]  . Diabetes mellitus (HCC)   . Drug addiction in remission (HCC)   . Erectile dysfunction 03/02/2017  . Heartburn   . Hyperlipidemia associated with type 2 diabetes mellitus (HCC)   . Hypertension   . Hypothyroidism    a. [facility]  . MI (myocardial infarction) (HCC) 07/2009   [facility] [place], [place] for last 2, 1st one in [place], [place]; total of 6 stents, last one 02/2014   . Morbid obesity (HCC) 07/06/2016  . Noncompliance with therapeutic plan    a. thyroid med -> profound hypothyroidism 04/2017.  Marland Kitchen. OSA (obstructive sleep apnea) 07/06/2016   Moderate with AHI 21.8/hr by PSG 08/2012 now on CPAP  . Prolonged QT interval 05/14/2017  . Tobacco abuse   . Wide-complex tachycardia (HCC) 05/14/2017    Past Surgical History:  Procedure Laterality Date  . APPENDECTOMY    . CARDIAC CATHETERIZATION N/A 09/16/2015   Procedure: Left Heart Cath and Coronary Angiography;  Surgeon: Runell GessJonathan J Berry, MD;  [facility];  Service: Cardiovascular;  Laterality: N/A;  . CARDIAC CATHETERIZATION N/A 09/16/2015   Procedure: Coronary Stent Intervention;  Surgeon: Runell GessJonathan J Berry, MD;  Location: [facility] INVASIVE CV LAB;  Service: Cardiovascular;  Laterality: N/A;  . CORONARY STENT PLACEMENT      Family History  Adopted: Yes  Problem Relation Age of Onset  . Diabetes Maternal Grandmother     Social History   Tobacco Use  . Smoking status: Former Smoker    Last attempt to quit: 01/31/2017    Years since quitting: 1.0  . Smokeless tobacco: Never Used  . Tobacco comment: quit 01/2017  Substance Use Topics  . Alcohol use: No    Alcohol/week: 0.0 standard drinks  .  Drug use: No    Current Outpatient Medications  Medication Sig Dispense Refill  . aspirin 81 MG tablet Take 1 tablet (81 mg total) by mouth daily. 90 tablet 3  . clobetasol (TEMOVATE) 0.05 % external solution Apply 1 application topically 2 (two) times daily. Use for 5 days then PRN 50 mL 0  . clopidogrel (PLAVIX) 75 MG tablet Take 1 tablet (75 mg total) by mouth daily. 90 tablet 3  . empagliflozin (JARDIANCE) 10 MG TABS tablet Take 10 mg by mouth daily. 90 tablet 3  . ezetimibe (ZETIA) 10 MG tablet  Take 1 tablet (10 mg total) by mouth daily. 90 tablet 3  . famotidine (PEPCID) 40 MG tablet Take 1 tablet (40 mg total) by mouth at bedtime.    . furosemide (LASIX) 80 MG tablet TAKE 1 TABLET 2 TIMES DAILY BY MOUTH  3  . glucose blood test strip USE AS INSTRUCTED  12  . Icosapent Ethyl (VASCEPA) 1 g CAPS Take 2 capsules (2 g total) by mouth 2 (two) times daily. 360 capsule 3  . insulin glargine (LANTUS) 100 UNIT/ML injection Inject 0.47 mLs (47 Units total) into the skin 2 (two) times daily. 30 mL 2  . Insulin Syringes, Disposable, U-100 0.5 ML MISC Use as directed 100 each 11  . ketoconazole (NIZORAL) 2 % shampoo Apply 1 application topically 2 (two) times a week. 120 mL 0  . levothyroxine (SYNTHROID, LEVOTHROID) 150 MCG tablet Take 1 tablet (150 mcg total) by mouth daily. 30 tablet 4  . metFORMIN (GLUCOPHAGE) 1000 MG tablet Take 1,000 mg by mouth 2 (two) times daily with a meal.    . metoprolol tartrate (LOPRESSOR) 50 MG tablet Take 1 tablet (50 mg total) by mouth 2 (two) times daily. 180 tablet 3  . nitroGLYCERIN (NITROSTAT) 0.4 MG SL tablet Place 1 tablet (0.4 mg total) under the tongue every 5 (five) minutes as needed for chest pain. 30 tablet 12  . NOVOLOG 100 UNIT/ML injection 20 units subcut with breakfast and 24 units with lunch and dinner. 10 mL 11  . pantoprazole (PROTONIX) 40 MG tablet Take 1 tablet (40 mg total) by mouth daily. 90 tablet 3  . potassium chloride SA (K-DUR,KLOR-CON) 20 MEQ tablet Take 40 mEq by mouth 2 (two) times daily.  2  . rosuvastatin (CRESTOR) 40 MG tablet Take 1 tablet (40 mg total) by mouth daily. 90 tablet 3  . sildenafil (VIAGRA) 50 MG tablet TAKE 1 TABLET (50 MG TOTAL) BY MOUTH DAILY AS NEEDED FOR ERECTILE DYSFUNCTION. 10 tablet 3  . TRUEPLUS LANCETS 28G MISC Use as directed 100 each 12   No current facility-administered medications for this visit.     Allergies  Allergen Reactions  . Levofloxacin Rash    Rash on back (not sun exposed)and  hypersensitivity to skin on exposed skin/arm Rash on back (not sun exposed)and hypersensitivity to skin on exposed skin/arm  . Nsaids     Avoid due to CKD  . 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  Pt only wants pain medicine if absolutely necessary.    Review of Systems:  Constitutional: Denies fever, chills, diaphoresis, appetite change and fatigue.  HEENT: Denies photophobia, eye pain, redness, hearing loss, ear pain, congestion, sore throat, rhinorrhea, sneezing, mouth sores, neck pain, neck stiffness and tinnitus.   Respiratory: Denies excessive SOB, DOE, cough, chest tightness,  and wheezing.  Has sleep apnea. Cardiovascular: Denies chest pain, palpitations and leg  swelling.  Genitourinary: Denies dysuria, urgency, frequency, hematuria, flank pain and difficulty urinating.  Musculoskeletal: Denies myalgias, back pain, joint swelling, arthralgias and gait problem.  Skin: No rash.  Neurological: Denies dizziness, seizures, syncope, weakness, light-headedness, numbness and headaches.  Hematological: Denies adenopathy. Easy bruising, personal or family bleeding history  Psychiatric/Behavioral: No anxiety or depression     Physical Exam:    BP 124/86   Pulse 80   Ht 5\' 5"  (1.651 m)   Wt 282 lb (127.9 kg)   SpO2 98%   BMI 46.93 kg/m  Filed Weights   02/08/18 0956  Weight: 282 lb (127.9 kg)   Constitutional:  Well-developed, in no acute distress. Psychiatric: Normal mood and affect. Behavior is normal. HEENT: Pupils normal.  Conjunctivae are normal. No scleral icterus. Neck supple.  Cardiovascular: Normal rate, regular rhythm. No edema Pulmonary/chest: Effort normal and breath sounds normal. No wheezing, rales or rhonchi. Abdominal: Soft, nondistended. Nontender. Bowel sounds active throughout. There are no masses palpable. No hepatomegaly. Mod Umbllcal hernia Rectal:  defered Neurological: Alert and oriented to person place and time. Skin: Skin is  warm and dry. No rashes noted.  Data Reviewed: I have personally reviewed following labs and imaging studies  CBC: CBC Latest Ref Rng & Units 05/13/2017 05/01/2017 04/25/2017  WBC 4.0 - 10.5 K/uL 12.6(H) 10.8(H) 11.8(H)  Hemoglobin 13.0 - 17.0 g/dL 11.9(L) 11.7(L) 11.9(L)  Hematocrit 39.0 - 52.0 % 36.0(L) 35.7(L) 36.4(L)  Platelets 150 - 400 K/uL 211 210 218    CMP: CMP Latest Ref Rng & Units 06/15/2017 05/18/2017 05/16/2017  Glucose 65 - 99 mg/dL 161(W) 960(A) 540(J)  BUN 6 - 24 mg/dL 17 81(X) 91(Y)  Creatinine 0.76 - 1.27 mg/dL 7.82 9.56(O) 1.30(Q)  Sodium 134 - 144 mmol/L 141 135 136  Potassium 3.5 - 5.2 mmol/L 4.5 4.1 3.3(L)  Chloride 96 - 106 mmol/L 100 96 101  CO2 20 - 29 mmol/L 26 22 28   Calcium 8.7 - 10.2 mg/dL 9.8 9.2 6.5(H)  Total Protein 6.0 - 8.5 g/dL 7.4 7.9 -  Total Bilirubin 0.0 - 1.2 mg/dL 0.2 0.4 -  Alkaline Phos 39 - 117 IU/L 73 83 -  AST 0 - 40 IU/L 32 63(H) -  ALT 0 - 44 IU/L 64(H) 108(H) -     Edman Circle, MD 02/08/2018, 10:11 AM  Cc: Marcine Matar, MD

## 2018-02-08 NOTE — Patient Instructions (Signed)
If you are age 49 or older, your body mass index should be between 23-30. Your Body mass index is 46.93 kg/m. If this is out of the aforementioned range listed, please consider follow up with your Primary Care Provider.  If you are age 664 or younger, your body mass index should be between 19-25. Your Body mass index is 46.93 kg/m. If this is out of the aformentioned range listed, please consider follow up with your Primary Care Provider.   You have been scheduled for an endoscopy. Please follow written instructions given to you at your visit today. If you use inhalers (even only as needed), please bring them with you on the day of your procedure. Your physician has requested that you go to www.startemmi.com and enter the access code given to you at your visit today. This web site gives a general overview about your procedure. However, you should still follow specific instructions given to you by our office regarding your preparation for the procedure.  You will be contacted by our office prior to your procedure for directions on holding your Plavix.  If you do not hear from our office 1 week prior to your scheduled procedure, please call [phone] to discuss.    Thank you,  Dr. Lynann Bolognaajesh Gupta

## 2018-02-08 NOTE — Telephone Encounter (Signed)
[facility] Pre-operative Risk Assessment     Request for surgical clearance:     Endoscopy Procedure  What type of surgery is being performed?    EGD  When is this surgery scheduled?     03/21/18  What type of clearance is required ?   Pharmacy  Are there any medications that need to be held prior to surgery and how long? Plavix   Practice name and name of physician performing surgery?      [facility]    What is your office phone and fax number?      Phone- [phone]  [phone]  Anesthesia type (None, local, MAC, general) ?       MAC

## 2018-02-09 NOTE — Telephone Encounter (Signed)
   Primary Cardiologist: Dietrich PatesPaula Ross, MD  Chart reviewed as part of pre-operative protocol coverage. Given past medical history and time since last visit, based on ACC/AHA guidelines, Marc Walker would be at acceptable risk for the planned procedure without further cardiovascular testing.   OK to hold Plavix 5 days pre op if needed, resume when safe.   I will route this recommendation to the requesting party via Epic fax function and remove from pre-op pool.  Please call with questions.  Corine ShelterLuke Ardis Lawley, PA-C 02/09/2018, 1:27 PM

## 2018-02-10 ENCOUNTER — Telehealth: Payer: Self-pay

## 2018-02-10 NOTE — Telephone Encounter (Signed)
Received permission to hold Plavix x 5 days before procedure. Called patient and instructed him to do so, patient voiced understanding.

## 2018-02-14 ENCOUNTER — Encounter: Payer: Self-pay | Admitting: Internal Medicine

## 2018-02-16 ENCOUNTER — Encounter: Payer: Self-pay | Admitting: Internal Medicine

## 2018-02-16 NOTE — Progress Notes (Signed)
Letter written for DSS per pt's request through Mychart.

## 2018-02-17 ENCOUNTER — Telehealth: Payer: Self-pay

## 2018-02-17 NOTE — Telephone Encounter (Signed)
Contacted pt and made aware that letter is ready for pick up. Pt states he will come by tomorrow or Monday morning to pickup letter

## 2018-03-02 ENCOUNTER — Other Ambulatory Visit: Payer: Self-pay | Admitting: Internal Medicine

## 2018-03-02 MED FILL — METOPROLOL TARTRATE 50 MG T: 50 | 30 days supply | Qty: 60 | Fill #2

## 2018-03-02 MED FILL — METFORMIN HCL ER 500 MG TAB: 500 | 30 days supply | Qty: 120 | Fill #11

## 2018-03-02 MED FILL — CLOPIDOGREL 75 MG TABLET: 75 | 30 days supply | Qty: 30 | Fill #3

## 2018-03-02 MED FILL — LEVOTHYROXINE 150 MCG TAB: 150 | 30 days supply | Qty: 30 | Fill #1

## 2018-03-02 MED FILL — PANTOPRAZOLE SOD DR 40 MG T: 40 | 30 days supply | Qty: 30 | Fill #1

## 2018-03-02 MED FILL — FUROSEMIDE 80 MG TABS: 80 | 30 days supply | Qty: 60 | Fill #9

## 2018-03-10 ENCOUNTER — Ambulatory Visit: Payer: Self-pay

## 2018-03-15 MED FILL — $Viagra 50mg tablet: 50 | 30 days supply | Qty: 10 | Fill #2

## 2018-03-21 ENCOUNTER — Ambulatory Visit (AMBULATORY_SURGERY_CENTER): Payer: Self-pay | Admitting: Gastroenterology

## 2018-03-21 ENCOUNTER — Encounter: Payer: Self-pay | Admitting: Gastroenterology

## 2018-03-21 VITALS — BP 107/64 | HR 67 | Resp 14

## 2018-03-21 DIAGNOSIS — K2970 Gastritis, unspecified, without bleeding: Secondary | ICD-10-CM

## 2018-03-21 DIAGNOSIS — K219 Gastro-esophageal reflux disease without esophagitis: Secondary | ICD-10-CM

## 2018-03-21 DIAGNOSIS — K229 Disease of esophagus, unspecified: Secondary | ICD-10-CM

## 2018-03-21 DIAGNOSIS — R739 Hyperglycemia, unspecified: Secondary | ICD-10-CM

## 2018-03-21 MED ORDER — INSULIN ASPART 100 UNIT/ML ~~LOC~~ SOLN: 10.0000 [IU] | Freq: Once | SUBCUTANEOUS | Status: DC

## 2018-03-21 MED ORDER — SODIUM CHLORIDE 0.9 % IV SOLN: 500.0000 mL | Freq: Once | INTRAVENOUS | Status: DC

## 2018-03-21 MED ORDER — INSULIN REGULAR HUMAN 100 UNIT/ML IJ SOLN
10.0000 [IU] | Freq: Once | INTRAMUSCULAR | Status: AC
  Administered 2018-03-21 (×2): 10 [IU] via INTRAVENOUS

## 2018-03-21 NOTE — Progress Notes (Signed)
Called to room to assist during endoscopic procedure.  Patient ID and intended procedure confirmed with present staff. Received instructions for my participation in the procedure from the performing physician.  

## 2018-03-21 NOTE — Progress Notes (Signed)
Pt's states no medical or surgical changes since previsit or office visit. 

## 2018-03-21 NOTE — Patient Instructions (Signed)
Impression/Recommendations:  Gastritis handout given to patient.  Resume previous diet. Continue present medications. Resume Plavix (clopidogrel)n at prior dose tomorrow.  Return to GI clinic in 12 weeks.    Make appointment with Dr. Laural BenesJohnson for uncontrolled diabetes.  Recommend colonoscopy for screening at age 47.  YOU HAD AN ENDOSCOPIC PROCEDURE TODAY AT THE [facility]:   Refer to the procedure report that was given to you for any specific questions about what was found during the examination.  If the procedure report does not answer your questions, please call your gastroenterologist to clarify.  If you requested that your care partner not be given the details of your procedure findings, then the procedure report has been included in a sealed envelope for you to review at your convenience later.  YOU SHOULD EXPECT: Some feelings of bloating in the abdomen. Passage of more gas than usual.  Walking can help get rid of the air that was put into your GI tract during the procedure and reduce the bloating. If you had a lower endoscopy (such as a colonoscopy or flexible sigmoidoscopy) you may notice spotting of blood in your stool or on the toilet paper. If you underwent a bowel prep for your procedure, you may not have a normal bowel movement for a few days.  Please Note:  You might notice some irritation and congestion in your nose or some drainage.  This is from the oxygen used during your procedure.  There is no need for concern and it should clear up in a day or so.  SYMPTOMS TO REPORT IMMEDIATELY:  Following upper endoscopy (EGD)  Vomiting of blood or coffee ground material  New chest pain or pain under the shoulder blades  Painful or persistently difficult swallowing  New shortness of breath  Fever of 100F or higher  Black, tarry-looking stools  For urgent or emergent issues, a gastroenterologist can be reached at any hour by calling [phone].   DIET:  We do  recommend a small meal at first, but then you may proceed to your regular diet.  Drink plenty of fluids but you should avoid alcoholic beverages for 24 hours.  ACTIVITY:  You should plan to take it easy for the rest of today and you should NOT DRIVE or use heavy machinery until tomorrow (because of the sedation medicines used during the test).    FOLLOW UP: Our staff will call the number listed on your records the next business day following your procedure to check on you and address any questions or concerns that you may have regarding the information given to you following your procedure. If we do not reach you, we will leave a message.  However, if you are feeling well and you are not experiencing any problems, there is no need to return our call.  We will assume that you have returned to your regular daily activities without incident.  If any biopsies were taken you will be contacted by phone or by letter within the next 1-3 weeks.  Please call us at [phone] if you have not heard about the biopsies in 3 weeks.    SIGNATURES/CONFIDENTIALITY: You and/or your care partner have signed paperwork which will be entered into your electronic medical record.  These signatures attest to the fact that that the information above on your After Visit Summary has been reviewed and is understood.  Full responsibility of the confidentiality of this discharge information lies with you and/or your care-partner.

## 2018-03-21 NOTE — Addendum Note (Signed)
Addended by: Jaydah Stahle M on: 03/21/2018 12:20 PM   Modules accepted: Orders  

## 2018-03-21 NOTE — Progress Notes (Signed)
Report given to PACU, vss 

## 2018-03-21 NOTE — Progress Notes (Signed)
Patient is scheduled for an appoint ment with the  [facility]ness clinic on October 14th at 1050. Patient is aware that that is the first available.

## 2018-03-21 NOTE — Op Note (Signed)
[facility] Patient Name: Marc Walker Procedure Date: 03/21/2018 10:10 AM MRN: [id number] Endoscopist: Lynann Bologna , MD Age: 47 Referring MD:  Date of Birth: [birth year] Gender: Male Account #: [id number] Procedure:                Upper GI endoscopy Indications:              Esophageal reflux symptoms that persist despite                            appropriate therapy Medicines:                Monitored Anesthesia Care Procedure:                Pre-Anesthesia Assessment:                           - Prior to the procedure, a History and Physical                            was performed, and patient medications and                            allergies were reviewed. The patient's tolerance of                            previous anesthesia was also reviewed. The risks                            and benefits of the procedure and the sedation                            options and risks were discussed with the patient.                            All questions were answered, and informed consent                            was obtained. Prior Anticoagulants: The patient has                            taken Plavix (clopidogrel), last dose was 5 days                            prior to procedure. ASA Grade Assessment: III - A                            patient with severe systemic disease. After                            reviewing the risks and benefits, the patient was                            deemed in satisfactory condition to undergo the  procedure.                           After obtaining informed consent, the endoscope was                            passed under direct vision. Throughout the                            procedure, the patient's blood pressure, pulse, and                            oxygen saturations were monitored continuously. The                            Endoscope was introduced through the mouth, and     advanced to the second part of duodenum. The upper                            GI endoscopy was accomplished without difficulty.                            The patient tolerated the procedure well. Scope In: Scope Out: Findings:                 The Z-line was irregular and was found 40 cm from                            the incisors with islands of salmon-colored mucosa.                            Examined by NBI. Biopsies were taken with a cold                            forceps for histology directed by NBI from all 4                            quadrants. Estimated blood loss was minimal.                           Localized mild inflammation characterized by                            erythema was found in the gastric antrum. Biopsies                            were taken with a cold forceps for histology.                            Estimated blood loss: none.                           The exam was otherwise without abnormality. Complications:            No immediate complications. Estimated Blood Loss:  Estimated blood loss: none. Impression:               - Z-line irregular, 40 cm from the incisors.                            Biopsied.                           - Mild gastritis. Biopsied.                           - The examination was otherwise normal. Recommendation:           - Patient has a contact number available for                            emergencies. The signs and symptoms of potential                            delayed complications were discussed with the                            patient. Return to normal activities tomorrow.                            Written discharge instructions were provided to the                            patient.                           - Resume previous diet.                           - Continue present medications.                           - Resume Plavix (clopidogrel) at prior dose                            tomorrow.                            - Await pathology results.                           - Return to GI clinic in 12 weeks. If with any                            upper GI problems, would recommend solid-phase                            gastric emptying scan.                           - Appointment with Dr. Laural Benes for uncontrolled  diabetes.                           - Recommend colonoscopy as a part of colorectal                            cancer screening at the age of 80 as per current                            guidelines. Lynann Bologna, MD 03/21/2018 10:27:16 AM This report has been signed electronically.

## 2018-03-21 NOTE — Progress Notes (Signed)
Late entry. Patient's BG on accucheck 413 upon arrival to admitting. Patient denies signs and symptoms of hyperglycemia, VSS, afebrile. Patient state he takes insulin injections daily, Jardiance po daily, but has been out for 2 weeks and, therefore, hasn't taken any for that period of time. He states he takes Metformin 1000mg  po twice daily. He did take it as usual yesterday, but held his dose this am as instructed. Dr. Chales AbrahamsGupta notified of above and ordered 10 units of regular insulin IV and to recheck his BG in 15 minutes. 10 units regular insulin adm IV at 0940. BG recheck at 0955 is 394. Patient stable and without complaints.  Dr. Chales AbrahamsGupta notified and CRNA is here to take patient for procedure.   Regular Novolin insulin lot# [id number].

## 2018-03-22 ENCOUNTER — Other Ambulatory Visit: Payer: Self-pay

## 2018-03-22 ENCOUNTER — Telehealth: Payer: Self-pay | Admitting: *Deleted

## 2018-03-22 NOTE — Telephone Encounter (Signed)
  Follow up Call-  Call back number 03/21/2018  Post procedure Call Back phone  # [phone]  Permission to leave phone message Yes  Some recent data might be hidden     Patient questions:  Do you have a fever, pain , or abdominal swelling? No. Pain Score  0 *  Have you tolerated food without any problems? Yes.    Have you been able to return to your normal activities? Yes.    Do you have any questions about your discharge instructions: Diet   No. Medications  No. Follow up visit  No.  Do you have questions or concerns about your Care? No.  Actions: * If pain score is 4 or above: No action needed, pain <4.

## 2018-03-23 ENCOUNTER — Telehealth: Payer: Self-pay

## 2018-03-23 ENCOUNTER — Encounter: Payer: Self-pay | Admitting: Gastroenterology

## 2018-03-23 NOTE — Telephone Encounter (Signed)
Contacted to make aware that I faxed disability paperwork back to [employer] disability and he can pick up ordinals up front

## 2018-03-28 ENCOUNTER — Other Ambulatory Visit: Payer: Self-pay | Admitting: Internal Medicine

## 2018-03-28 MED FILL — TRUEplus LANCETS 28G MISC: 25 days supply | Qty: 100 | Fill #2

## 2018-03-28 MED FILL — $novoLOG 100 UNITS/ML VIAL: 100 | 87 days supply | Qty: 60 | Fill #1

## 2018-03-28 MED FILL — LEVOTHYROXINE 150 MCG TAB: 150 | 30 days supply | Qty: 30 | Fill #2

## 2018-03-28 MED FILL — ?CLOPIDOGREL 75MG TA: 75 | 30 days supply | Qty: 30 | Fill #4

## 2018-03-28 MED FILL — ?METOPROLOL 50 MG TABLET: 50 | 30 days supply | Qty: 60 | Fill #3

## 2018-03-28 MED FILL — ?PANTOPRAZOLE SO DR 40MG TA: 40 | 30 days supply | Qty: 30 | Fill #2

## 2018-03-28 MED FILL — ROSUVASTATIN CALCIUM 40 MG: 40 | 90 days supply | Qty: 90 | Fill #1

## 2018-03-28 MED FILL — $LANTUS 100 UNITS/ML VIAL: 100 | 31 days supply | Qty: 30 | Fill #1

## 2018-03-28 MED FILL — TRUE METRIX TEST STRIP: 30 days supply | Qty: 100 | Fill #7

## 2018-03-28 MED FILL — FUROSEMIDE 80 MG TABS: 80 | 30 days supply | Qty: 60 | Fill #10

## 2018-03-28 NOTE — Telephone Encounter (Signed)
I replied to patient.  No follow up needed unless he continues to have symptoms. [name], PA-C    03/28/2018 5:14 PM

## 2018-03-29 MED FILL — METFORMIN HCL ER 500 MG TAB: 500 | 30 days supply | Qty: 120 | Fill #0

## 2018-03-31 ENCOUNTER — Ambulatory Visit: Payer: Self-pay

## 2018-03-31 ENCOUNTER — Ambulatory Visit: Payer: Self-pay | Attending: Internal Medicine

## 2018-04-04 ENCOUNTER — Ambulatory Visit: Payer: Self-pay | Attending: Family Medicine

## 2018-04-04 DIAGNOSIS — E1165 Type 2 diabetes mellitus with hyperglycemia: Secondary | ICD-10-CM

## 2018-04-04 DIAGNOSIS — E118 Type 2 diabetes mellitus with unspecified complications: Secondary | ICD-10-CM

## 2018-04-04 DIAGNOSIS — I2510 Atherosclerotic heart disease of native coronary artery without angina pectoris: Secondary | ICD-10-CM | POA: Insufficient documentation

## 2018-04-04 DIAGNOSIS — E785 Hyperlipidemia, unspecified: Secondary | ICD-10-CM | POA: Insufficient documentation

## 2018-04-04 DIAGNOSIS — IMO0002 Reserved for concepts with insufficient information to code with codable children: Secondary | ICD-10-CM

## 2018-04-04 DIAGNOSIS — E039 Hypothyroidism, unspecified: Secondary | ICD-10-CM

## 2018-04-04 NOTE — Progress Notes (Signed)
Patient here for lab visit only 

## 2018-04-05 LAB — TSH: TSH: 19.3 u[IU]/mL — ABNORMAL HIGH (ref 0.450–4.500)

## 2018-04-05 LAB — HEPATIC FUNCTION PANEL
ALT: 32 IU/L (ref 0–44)
AST: 16 IU/L (ref 0–40)
Albumin: 3.5 g/dL (ref 3.5–5.5)
Alkaline Phosphatase: 78 IU/L (ref 39–117)
BILIRUBIN, DIRECT: 0.14 mg/dL (ref 0.00–0.40)
Bilirubin Total: <0.2 mg/dL (ref 0.0–1.2)
TOTAL PROTEIN: 6.2 g/dL (ref 6.0–8.5)

## 2018-04-05 LAB — LIPID PANEL
CHOLESTEROL TOTAL: 578 mg/dL — AB (ref 100–199)
Chol/HDL Ratio: 44.5 ratio — ABNORMAL HIGH (ref 0.0–5.0)
HDL: 13 mg/dL — ABNORMAL LOW (ref >39)
Triglycerides: 3115 mg/dL (ref 0–149)

## 2018-04-05 LAB — CBC
HEMATOCRIT: 41.2 % (ref 37.5–51.0)
HEMOGLOBIN: 13.5 g/dL (ref 13.0–17.7)
MCH: 29.7 pg (ref 26.6–33.0)
MCHC: 32.8 g/dL (ref 31.5–35.7)
MCV: 91 fL (ref 79–97)
Platelets: 252 10*3/uL (ref 150–450)
RBC: 4.55 x10E6/uL (ref 4.14–5.80)
RDW: 14 % (ref 12.3–15.4)
WBC: 8.4 10*3/uL (ref 3.4–10.8)

## 2018-04-05 LAB — COMPREHENSIVE METABOLIC PANEL
ALBUMIN: 3.5 g/dL (ref 3.5–5.5)
ALK PHOS: 78 IU/L (ref 39–117)
ALT: 32 IU/L (ref 0–44)
AST: 15 IU/L (ref 0–40)
Albumin/Globulin Ratio: 1.3 (ref 1.2–2.2)
BUN / CREAT RATIO: 24 — AB (ref 9–20)
BUN: 18 mg/dL (ref 6–24)
Bilirubin Total: <0.2 mg/dL (ref 0.0–1.2)
CALCIUM: 8.4 mg/dL — AB (ref 8.7–10.2)
CO2: 18 mmol/L — AB (ref 20–29)
CREATININE: 0.74 mg/dL — AB (ref 0.76–1.27)
Chloride: 91 mmol/L — ABNORMAL LOW (ref 96–106)
GFR, EST AFRICAN AMERICAN: 126 mL/min/{1.73_m2} (ref >59)
GFR, EST NON AFRICAN AMERICAN: 109 mL/min/{1.73_m2} (ref >59)
Globulin, Total: 2.6 g/dL (ref 1.5–4.5)
Glucose: 386 mg/dL — ABNORMAL HIGH (ref 65–99)
Potassium: 3.6 mmol/L (ref 3.5–5.2)
Sodium: 128 mmol/L — ABNORMAL LOW (ref 134–144)
TOTAL PROTEIN: 6.1 g/dL (ref 6.0–8.5)

## 2018-04-06 ENCOUNTER — Telehealth: Payer: Self-pay | Admitting: *Deleted

## 2018-04-06 ENCOUNTER — Other Ambulatory Visit: Payer: Self-pay | Admitting: Internal Medicine

## 2018-04-06 ENCOUNTER — Encounter: Payer: Self-pay | Admitting: Internal Medicine

## 2018-04-06 DIAGNOSIS — I2510 Atherosclerotic heart disease of native coronary artery without angina pectoris: Secondary | ICD-10-CM

## 2018-04-06 DIAGNOSIS — E871 Hypo-osmolality and hyponatremia: Secondary | ICD-10-CM

## 2018-04-06 DIAGNOSIS — E785 Hyperlipidemia, unspecified: Secondary | ICD-10-CM

## 2018-04-06 MED ORDER — LEVOTHYROXINE SODIUM 200 MCG PO TABS: 200.0000 ug | ORAL_TABLET | Freq: Every day | ORAL | 3 refills | Status: DC

## 2018-04-06 NOTE — Telephone Encounter (Signed)
Agree.   Artasia Thang C. Andie Mortimer, RN, ANP-C [facility] [address] [place], [place]  [address] [phone]  

## 2018-04-06 NOTE — Telephone Encounter (Signed)
-----   Message from Beatrice Lecher, PA-C sent at 04/05/2018  6:18 PM EDT ----- LFTs ok.  Triglycerides are super high as well as his total cholesterol.   Recommendations:  - Confirm that he is taking his medications  - question if he was not fasting  - repeat fasting Lipids (make sure he knows not to eat after midnight) [name], PA-C    04/05/2018 6:17 PM

## 2018-04-06 NOTE — Telephone Encounter (Signed)
S/w pt about lab results. I asked pt if he had been taking his medication Crestor and was he fasting for the lab work. Pt first answered he was taking the Crestor then said he had been out of the Crestor x 1 month and has restarted back on Crestor 03/28/18. Pt answered yes that he was fasting. Pt educated to limit simple carbohydrates and try changing to more complex carbs though still in moderation. I advised pt let's repeat labs in 8 weeks 06/06/18, which he will have done at [facility]. Pt also states he has been donating plasma to help with his finances as her is not working right and said that he was deferred because his protein level was high. I advised pt to f/u with his PCP in regards to his protein level being high. Pt states he thinks it is the fish oil that [name], PA has put him on. I again advised to f/u w/PCP as [name], PA is out of the office for the rest of the week, however I will be sure to d/w him when he is back in the office. Pt also inquired did we have an office in [place] area. I answered yes. Pt said this would be closer to home for him. Pt is agreeable to plan of care to recheck Fasting Lipid and Liver Panel 06/06/18 as he has not been on Crestor x 1 month and only has just restarted.

## 2018-04-06 NOTE — Progress Notes (Signed)
PC placed to pt this a.m.  Pt informed of TSH level still not at goal.  He reports taking the Levothyroxine 150 mcg daily in the mornings 1-2 hrs before other meds.  I recommend increase dose to 200 mcg and recheck level in 6 wks.  BMP revealed a gap acidosis with low Na.  He checks BS 5 x a day with range 150-170 even in a.m before BF.  At recent EGD procedure, BS was 400.  Endorses compliance with Lantus 47 units BID and Novolog 20/24/24 units with meals.  Pt is on Furosemide for CHF.  I recommend increase Lantus to 50 units BID and return to lab in 2 days for repeat BMP. Message sent to schedule to put him on my schedule for Friday or Monday if any cancellations.

## 2018-04-08 ENCOUNTER — Telehealth: Payer: Self-pay | Admitting: Internal Medicine

## 2018-04-08 ENCOUNTER — Ambulatory Visit: Payer: Self-pay | Attending: Internal Medicine

## 2018-04-08 ENCOUNTER — Encounter: Payer: Self-pay | Admitting: Internal Medicine

## 2018-04-08 ENCOUNTER — Ambulatory Visit (HOSPITAL_BASED_OUTPATIENT_CLINIC_OR_DEPARTMENT_OTHER): Payer: Self-pay | Admitting: Internal Medicine

## 2018-04-08 VITALS — BP 121/72 | HR 66 | Temp 98.0°F | Resp 16 | Wt 287.8 lb

## 2018-04-08 DIAGNOSIS — Z79890 Hormone replacement therapy (postmenopausal): Secondary | ICD-10-CM | POA: Insufficient documentation

## 2018-04-08 DIAGNOSIS — Z79899 Other long term (current) drug therapy: Secondary | ICD-10-CM | POA: Insufficient documentation

## 2018-04-08 DIAGNOSIS — K219 Gastro-esophageal reflux disease without esophagitis: Secondary | ICD-10-CM | POA: Insufficient documentation

## 2018-04-08 DIAGNOSIS — I5032 Chronic diastolic (congestive) heart failure: Secondary | ICD-10-CM | POA: Insufficient documentation

## 2018-04-08 DIAGNOSIS — Z6841 Body Mass Index (BMI) 40.0 and over, adult: Secondary | ICD-10-CM | POA: Insufficient documentation

## 2018-04-08 DIAGNOSIS — N529 Male erectile dysfunction, unspecified: Secondary | ICD-10-CM | POA: Insufficient documentation

## 2018-04-08 DIAGNOSIS — Z794 Long term (current) use of insulin: Secondary | ICD-10-CM

## 2018-04-08 DIAGNOSIS — E039 Hypothyroidism, unspecified: Secondary | ICD-10-CM

## 2018-04-08 DIAGNOSIS — E871 Hypo-osmolality and hyponatremia: Secondary | ICD-10-CM | POA: Insufficient documentation

## 2018-04-08 DIAGNOSIS — I2510 Atherosclerotic heart disease of native coronary artery without angina pectoris: Secondary | ICD-10-CM | POA: Insufficient documentation

## 2018-04-08 DIAGNOSIS — G5603 Carpal tunnel syndrome, bilateral upper limbs: Secondary | ICD-10-CM | POA: Insufficient documentation

## 2018-04-08 DIAGNOSIS — E1165 Type 2 diabetes mellitus with hyperglycemia: Secondary | ICD-10-CM | POA: Insufficient documentation

## 2018-04-08 DIAGNOSIS — E1142 Type 2 diabetes mellitus with diabetic polyneuropathy: Secondary | ICD-10-CM | POA: Insufficient documentation

## 2018-04-08 DIAGNOSIS — Z886 Allergy status to analgesic agent status: Secondary | ICD-10-CM | POA: Insufficient documentation

## 2018-04-08 DIAGNOSIS — G4733 Obstructive sleep apnea (adult) (pediatric): Secondary | ICD-10-CM | POA: Insufficient documentation

## 2018-04-08 DIAGNOSIS — E785 Hyperlipidemia, unspecified: Secondary | ICD-10-CM | POA: Insufficient documentation

## 2018-04-08 DIAGNOSIS — Z7982 Long term (current) use of aspirin: Secondary | ICD-10-CM | POA: Insufficient documentation

## 2018-04-08 DIAGNOSIS — N401 Enlarged prostate with lower urinary tract symptoms: Secondary | ICD-10-CM | POA: Insufficient documentation

## 2018-04-08 DIAGNOSIS — Z833 Family history of diabetes mellitus: Secondary | ICD-10-CM | POA: Insufficient documentation

## 2018-04-08 DIAGNOSIS — Z955 Presence of coronary angioplasty implant and graft: Secondary | ICD-10-CM | POA: Insufficient documentation

## 2018-04-08 DIAGNOSIS — Z7902 Long term (current) use of antithrombotics/antiplatelets: Secondary | ICD-10-CM | POA: Insufficient documentation

## 2018-04-08 DIAGNOSIS — Z87891 Personal history of nicotine dependence: Secondary | ICD-10-CM | POA: Insufficient documentation

## 2018-04-08 DIAGNOSIS — I110 Hypertensive heart disease with heart failure: Secondary | ICD-10-CM | POA: Insufficient documentation

## 2018-04-08 LAB — GLUCOSE, POCT (MANUAL RESULT ENTRY): POC Glucose: 346 mg/dl — AB (ref 70–99)

## 2018-04-08 MED ORDER — EZETIMIBE 10 MG PO TABS: 10.0000 mg | ORAL_TABLET | Freq: Every day | ORAL | 3 refills | Status: DC

## 2018-04-08 MED ORDER — NOVOLOG 100 UNIT/ML ~~LOC~~ SOLN: SUBCUTANEOUS | 11 refills | Status: DC

## 2018-04-08 MED ORDER — EMPAGLIFLOZIN 10 MG PO TABS: 10.0000 mg | ORAL_TABLET | Freq: Every day | ORAL | 3 refills | Status: DC

## 2018-04-08 MED ORDER — INSULIN GLARGINE 100 UNIT/ML ~~LOC~~ SOLN: 55.0000 [IU] | Freq: Two times a day (BID) | SUBCUTANEOUS | 2 refills | Status: DC

## 2018-04-08 NOTE — Patient Instructions (Addendum)
Please give patient an appointment with our clinical pharmacist Franky Macho, in 1 week for titration of insulin.  Please increase Lantus to 55 units twice a day and NovoLog to 30 units with meals. Continue to check blood sugars at least 3 times a day before meals and bring in those readings on your visit in 1 week with our clinical pharmacist. You have been referred to an endocrinologist.

## 2018-04-08 NOTE — Progress Notes (Signed)
Pt states he has not eaten anything this morning  Pt states he took 50 units of lantus at 8:00am

## 2018-04-08 NOTE — Progress Notes (Signed)
Patient ID: Marc Walker, male    DOB: 1970/07/29  MRN: [id number]  CC: Diabetes   Subjective: Marc Walker is a 47 y.o. male who presents for UC visit for hyperglycemia His concerns today include:  47 year old male with history of CAD (8stents), DM type II, HL, HTN, obesity, OSA on CPAP, tobacco dependence, BPH, hypothyroid.  DM:  I last spoke with pt 04/06/2018.  He reports yesterday a.m BS was in the 280, later in the upper 100s then 200s again in the evenings. -mom does the cooking which is more [place] style. Seldom snacks b/w meals. -compliant with Lantus 50 units BID, Novolog 24/24/20, also taking Metformin.  Out of Jardiance x 2 mths Blood sugar this morning is in the 340 range.  He took Lantus this morning but did not take NovoLog as he has not eaten as yet for the morning. -On last chemistry blood sugar was in the 300s with a low sodium of 128 and an anion gap -He endorses frequent urination but he is on furosemide.  Endorses frequent thirst.  Some blurred vision that is unchanged.  Reportedly saw his eye doctor about 1 to 2 months ago  CAD: No chest pains or shortness of breath.  No PND orthopnea.  Out of Wellbutrin x 2 wks and does not feel he needs it any.  He has stopped smoking over 1 yr ago.  HL:  Out of Zetia x 2 mths  Hypothyroidism: We have had a hard time getting his TSH to goal.  He endorses taking levothyroxine first thing in the mornings at least 2 hours before he eats and before his other medications.  This time I increased the levothyroxine by 50 mcgs to a total of 200 mcg daily.  Plan to recheck TSH in 6 weeks.   Patient Active Problem List   Diagnosis Date Noted  . CAD S/P multiple PCI's 08/09/2017  . Prolonged QT interval 05/14/2017  . Wide-complex tachycardia (HCC) 05/14/2017  . GERD (gastroesophageal reflux disease) 05/13/2017  . Hypothyroidism   . Bilateral carpal tunnel syndrome 04/08/2017  . Chronic diastolic CHF (congestive heart failure)  (HCC) 03/09/2017  . Erectile dysfunction 03/02/2017  . OSA on CPAP 07/06/2016  . Morbid obesity (HCC) 07/06/2016  . DM type 2 with diabetic peripheral neuropathy (HCC) 12/16/2015  . CAD (coronary artery disease) 09/30/2015  . Essential hypertension 09/16/2015  . Hyperlipidemia      Current Outpatient Medications on File Prior to Visit  Medication Sig Dispense Refill  . aspirin 81 MG tablet Take 1 tablet (81 mg total) by mouth daily. 90 tablet 3  . clobetasol (TEMOVATE) 0.05 % external solution Apply 1 application topically 2 (two) times daily. Use for 5 days then PRN (Patient not taking: Reported on 03/21/2018) 50 mL 0  . clopidogrel (PLAVIX) 75 MG tablet Take 1 tablet (75 mg total) by mouth daily. 90 tablet 3  . famotidine (PEPCID) 40 MG tablet Take 1 tablet (40 mg total) by mouth at bedtime.    . furosemide (LASIX) 80 MG tablet TAKE 1 TABLET 2 TIMES DAILY BY MOUTH  3  . glucose blood test strip USE AS INSTRUCTED  12  . Icosapent Ethyl (VASCEPA) 1 g CAPS Take 2 capsules (2 g total) by mouth 2 (two) times daily. 360 capsule 3  . Insulin Syringes, Disposable, U-100 0.5 ML MISC Use as directed 100 each 11  . ketoconazole (NIZORAL) 2 % shampoo Apply 1 application topically 2 (two) times a week. 120 mL 0  .  levothyroxine (SYNTHROID, LEVOTHROID) 200 MCG tablet Take 1 tablet (200 mcg total) by mouth daily. Dose increase. 30 tablet 3  . metFORMIN (GLUCOPHAGE) 1000 MG tablet Take 1,000 mg by mouth 2 (two) times daily with a meal.    . metFORMIN (GLUCOPHAGE-XR) 500 MG 24 hr tablet TAKE 2 TABLETS BY MOUTH 2 TIMES DAILY WITH A MEAL. 120 tablet 11  . metoprolol tartrate (LOPRESSOR) 50 MG tablet Take 1 tablet (50 mg total) by mouth 2 (two) times daily. 180 tablet 3  . nitroGLYCERIN (NITROSTAT) 0.4 MG SL tablet Place 1 tablet (0.4 mg total) under the tongue every 5 (five) minutes as needed for chest pain. (Patient not taking: Reported on 03/21/2018) 30 tablet 12  . pantoprazole (PROTONIX) 40 MG tablet Take  1 tablet (40 mg total) by mouth daily. 90 tablet 3  . potassium chloride SA (K-DUR,KLOR-CON) 20 MEQ tablet Take 40 mEq by mouth 2 (two) times daily.  2  . rosuvastatin (CRESTOR) 40 MG tablet Take 1 tablet (40 mg total) by mouth daily. 90 tablet 3  . sildenafil (VIAGRA) 50 MG tablet TAKE 1 TABLET (50 MG TOTAL) BY MOUTH DAILY AS NEEDED FOR ERECTILE DYSFUNCTION. 10 tablet 3  . TRUEPLUS LANCETS 28G MISC Use as directed 100 each 12   Current Facility-Administered Medications on File Prior to Visit  Medication Dose Route Frequency Provider Last Rate Last Dose  . insulin aspart (novoLOG) injection 10 Units  10 Units Intravenous Once Lynann Bologna, MD        Allergies  Allergen Reactions  . Levofloxacin Rash    Rash on back (not sun exposed)and hypersensitivity to skin on exposed skin/arm Rash on back (not sun exposed)and hypersensitivity to skin on exposed skin/arm  . Nsaids     Avoid due to CKD  . 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  Pt only wants pain medicine if absolutely necessary.    Social History   Socioeconomic History  . Marital status: Divorced    Spouse name: Not on file  . Number of children: Not on file  . Years of education: Not on file  . Highest education level: Not on file  Occupational History  . Occupation: Company secretary  Social Needs  . Financial resource strain: Not on file  . Food insecurity:    Worry: Not on file    Inability: Not on file  . Transportation needs:    Medical: Not on file    Non-medical: Not on file  Tobacco Use  . Smoking status: Former Smoker    Types: Cigarettes    Last attempt to quit: 01/31/2017    Years since quitting: 1.1  . Smokeless tobacco: Never Used  . Tobacco comment: quit 01/2017  Substance and Sexual Activity  . Alcohol use: No    Alcohol/week: 0.0 standard drinks  . Drug use: No  . Sexual activity: Not on file  Lifestyle  . Physical activity:    Days per week: Not on file    Minutes per  session: Not on file  . Stress: Not on file  Relationships  . Social connections:    Talks on phone: Not on file    Gets together: Not on file    Attends religious service: Not on file    Active member of club or organization: Not on file    Attends meetings of clubs or organizations: Not on file    Relationship status: Not on file  .  Intimate partner violence:    Fear of current or ex partner: Not on file    Emotionally abused: Not on file    Physically abused: Not on file    Forced sexual activity: Not on file  Other Topics Concern  . Not on file  Social History Narrative   Adopted. Very little info on biological parents, ?diabetes.   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 Diabetes Maternal Grandmother     Past Surgical History:  Procedure Laterality Date  . APPENDECTOMY    . CARDIAC CATHETERIZATION N/A 09/16/2015   Procedure: Left Heart Cath and Coronary Angiography;  Surgeon: Runell Gess, MD;  Location: [facility] INVASIVE CV LAB;  Service: Cardiovascular;  Laterality: N/A;  . CARDIAC CATHETERIZATION N/A 09/16/2015   Procedure: Coronary Stent Intervention;  Surgeon: Runell Gess, MD;  Location: [facility] INVASIVE CV LAB;  Service: Cardiovascular;  Laterality: N/A;  . CORONARY STENT PLACEMENT      ROS: Review of Systems Negative except as stated above PHYSICAL EXAM: BP 121/72   Pulse 66   Temp 98 F (36.7 C) (Oral)   Resp 16   Wt 287 lb 12.8 oz (130.5 kg)   SpO2 96%   BMI 47.89 kg/m   Physical Exam  General appearance - alert, well appearing, and in no distress Mental status - normal mood, behavior, speech, dress, motor activity, and thought processes Mouth: moist oral mucosa Chest - clear to auscultation, no wheezes, rales or rhonchi, symmetric air entry Heart - normal rate, regular rhythm, normal S1, S2, no murmurs, rubs, clicks or gallops Extremities - no LE edema    Chemistry      Component Value  Date/Time   NA 128 (L) 04/04/2018 0933   K 3.6 04/04/2018 0933   CL 91 (L) 04/04/2018 0933   CO2 18 (L) 04/04/2018 0933   BUN 18 04/04/2018 0933   CREATININE 0.74 (L) 04/04/2018 0933   CREATININE 0.70 06/15/2016 1037      Component Value Date/Time   CALCIUM 8.4 (L) 04/04/2018 0933   ALKPHOS 78 04/04/2018 0933   ALKPHOS 78 04/04/2018 0933   AST 16 04/04/2018 0933   AST 15 04/04/2018 0933   ALT 32 04/04/2018 0933   ALT 32 04/04/2018 0933   BILITOT <0.2 04/04/2018 0933   BILITOT <0.2 04/04/2018 0933      Results for orders placed or performed in visit on 04/08/18  POCT glucose (manual entry)  Result Value Ref Range   POC Glucose 346 (A) 70 - 99 mg/dl   Lab Results  Component Value Date   HGBA1C 7.9 (A) 02/01/2018    ASSESSMENT AND PLAN: 1. DM type 2 uncontrolled with hyperglycemia with long-term current use of insulin  Dietary counseling given. Increase Lantus to 55 units twice a day.  Change NovoLog to 30 units with meals.  Patient to continue monitoring blood sugars.  I will have him follow-up with our clinical pharmacist in 1 week and he will bring his blood sugar readings in with him for further titration. Dietary counseling given. Refer to endocrinology. - POCT glucose (manual entry) - Ambulatory referral to Endocrinology - NOVOLOG 100 UNIT/ML injection; 30 units with meals.  Dispense: 30 mL; Refill: 11 - empagliflozin (JARDIANCE) 10 MG TABS tablet; Take 10 mg by mouth daily.  Dispense: 90 tablet; Refill: 3 - insulin glargine (LANTUS) 100 UNIT/ML injection; Inject 0.55 mLs (55 Units total) into  the skin 2 (two) times daily.  Dispense: 30 mL; Refill: 2  2. Hyponatremia Most likely due to hyperglycemia.  We will recheck BMP today. - Osmolality - Osmolality, urine - Basic Metabolic Panel  3. Hypothyroidism, unspecified type - Ambulatory referral to Endocrinology  Patient was given the opportunity to ask questions.  Patient verbalized understanding of the plan and  was able to repeat key elements of the plan.   Orders Placed This Encounter  Procedures  . Osmolality  . Osmolality, urine  . Ambulatory referral to Endocrinology  . POCT glucose (manual entry)     Requested Prescriptions   Signed Prescriptions Disp Refills  . ezetimibe (ZETIA) 10 MG tablet 90 tablet 3    Sig: Take 1 tablet (10 mg total) by mouth daily.  Marland Kitchen NOVOLOG 100 UNIT/ML injection 30 mL 11    Sig: 30 units with meals.  . empagliflozin (JARDIANCE) 10 MG TABS tablet 90 tablet 3    Sig: Take 10 mg by mouth daily.  . insulin glargine (LANTUS) 100 UNIT/ML injection 30 mL 2    Sig: Inject 0.55 mLs (55 Units total) into the skin 2 (two) times daily.    Return in about 1 month (around 05/09/2018).  Jonah Blue, MD, FACP

## 2018-04-09 LAB — HEPATIC FUNCTION PANEL
ALBUMIN: 3.9 g/dL (ref 3.5–5.5)
ALK PHOS: 81 IU/L (ref 39–117)
ALT: 34 IU/L (ref 0–44)
AST: 25 IU/L (ref 0–40)
BILIRUBIN TOTAL: 0.2 mg/dL (ref 0.0–1.2)
BILIRUBIN, DIRECT: 0.07 mg/dL (ref 0.00–0.40)
Total Protein: 6.5 g/dL (ref 6.0–8.5)

## 2018-04-09 LAB — OSMOLALITY: Osmolality Meas: 295 mOsmol/kg (ref 275–295)

## 2018-04-09 LAB — BASIC METABOLIC PANEL
BUN/Creatinine Ratio: 22 — ABNORMAL HIGH (ref 9–20)
BUN: 19 mg/dL (ref 6–24)
CO2: 22 mmol/L (ref 20–29)
CREATININE: 0.86 mg/dL (ref 0.76–1.27)
Calcium: 9.2 mg/dL (ref 8.7–10.2)
Chloride: 92 mmol/L — ABNORMAL LOW (ref 96–106)
GFR calc Af Amer: 119 mL/min/{1.73_m2} (ref >59)
GFR, EST NON AFRICAN AMERICAN: 103 mL/min/{1.73_m2} (ref >59)
Glucose: 312 mg/dL — ABNORMAL HIGH (ref 65–99)
Potassium: 3.9 mmol/L (ref 3.5–5.2)
SODIUM: 133 mmol/L — AB (ref 134–144)

## 2018-04-09 LAB — LIPID PANEL
Chol/HDL Ratio: 17.6 ratio — ABNORMAL HIGH (ref 0.0–5.0)
Cholesterol, Total: 440 mg/dL — ABNORMAL HIGH (ref 100–199)
HDL: 25 mg/dL — ABNORMAL LOW (ref >39)
Triglycerides: 1403 mg/dL (ref 0–149)

## 2018-04-09 LAB — OSMOLALITY, URINE: Osmolality, Ur: 490 mosm/kg

## 2018-04-11 ENCOUNTER — Encounter: Payer: Self-pay | Admitting: Internal Medicine

## 2018-04-11 ENCOUNTER — Ambulatory Visit: Payer: Self-pay | Admitting: Family Medicine

## 2018-04-11 DIAGNOSIS — Z794 Long term (current) use of insulin: Secondary | ICD-10-CM

## 2018-04-11 DIAGNOSIS — E1165 Type 2 diabetes mellitus with hyperglycemia: Secondary | ICD-10-CM

## 2018-04-11 MED ORDER — NOVOLOG 100 UNIT/ML ~~LOC~~ SOLN: SUBCUTANEOUS | 11 refills | Status: DC

## 2018-04-11 MED ORDER — INSULIN GLARGINE 100 UNIT/ML ~~LOC~~ SOLN: 60.0000 [IU] | Freq: Two times a day (BID) | SUBCUTANEOUS | 2 refills | Status: DC

## 2018-04-11 NOTE — Telephone Encounter (Signed)
Patient mychart refill request for jardiance

## 2018-04-12 ENCOUNTER — Telehealth: Payer: Self-pay | Admitting: *Deleted

## 2018-04-12 DIAGNOSIS — I2510 Atherosclerotic heart disease of native coronary artery without angina pectoris: Secondary | ICD-10-CM

## 2018-04-12 DIAGNOSIS — E785 Hyperlipidemia, unspecified: Secondary | ICD-10-CM

## 2018-04-12 MED ORDER — FENOFIBRATE 145 MG PO TABS: 145.0000 mg | ORAL_TABLET | Freq: Every day | ORAL | 11 refills | Status: DC

## 2018-04-12 NOTE — Telephone Encounter (Signed)
Pt has been notified of lab results by phone with verbal understanding. Pt has been advised to stop fish oil, pt states he is only taking OTC fish oil because the Vascepa was too expensive, though pt is agreeable to stop fish oil. Pt aware to continue on Crestor and Zetia. Pt agreeable to start Fenofibrate 145 mg daily, Rx has been sent in. Fasting Lipids and Liver panel to be done 05/23/18. Pt is agreeable to referral to Lipid Clinic, which appt for the Lipid Clinic will be after he has had the Fasting Lipid and Liver Panel done. Pt thanked me for the call.

## 2018-04-12 NOTE — Telephone Encounter (Signed)
-----   Message from Beatrice Lecher, [place] sent at 04/12/2018  1:24 PM EDT ----- Triglycerides remains markedly elevated.  Cholesterol is also significantly elevated.   Understand he did not take Rosuvastatin for 1 month prior to lab draw. However, he needs more intensive therapy to control his Triglycerides.    Recommendations:  - Discontinue Vascepa/Fish Oil  - Continue Ezetimibe and Rosuvastatin at current doses  - Start Fenofibrate 145 mg Once daily, #30, 11 refills  - Obtain Lipids and LFTs in 4-6 weeks  - Refer to Lipid Clinic.  He should see them after the follow up labs. [name], PA-C    04/12/2018 1:20 PM

## 2018-05-05 ENCOUNTER — Ambulatory Visit: Payer: Self-pay | Admitting: Internal Medicine

## 2018-05-09 ENCOUNTER — Other Ambulatory Visit: Payer: Self-pay | Admitting: Internal Medicine

## 2018-05-09 DIAGNOSIS — E1142 Type 2 diabetes mellitus with diabetic polyneuropathy: Secondary | ICD-10-CM

## 2018-05-09 MED FILL — TRUEplus LANCETS 28G MISC: 25 days supply | Qty: 100 | Fill #3

## 2018-05-09 MED FILL — METFORMIN HCL ER 500 MG TAB: 500 | 30 days supply | Qty: 120 | Fill #1

## 2018-05-09 MED FILL — ?PANTOPRAZOLE SOD DR 40MG T: 40 | 30 days supply | Qty: 30 | Fill #3

## 2018-05-09 MED FILL — TRUE METRIX TEST STRIP: 25 days supply | Qty: 100 | Fill #0

## 2018-05-09 MED FILL — ?METOPROLOL 50 MG TABLET: 50 | 30 days supply | Qty: 60 | Fill #4

## 2018-05-09 MED FILL — ?CLOPIDOGREL 75MG TA: 75 | 30 days supply | Qty: 30 | Fill #5

## 2018-05-09 MED FILL — LEVOTHYROXINE 200 MCG TAB: 200 | 30 days supply | Qty: 30 | Fill #0

## 2018-05-10 ENCOUNTER — Ambulatory Visit: Payer: Self-pay | Attending: Internal Medicine | Admitting: Internal Medicine

## 2018-05-10 ENCOUNTER — Encounter: Payer: Self-pay | Admitting: Internal Medicine

## 2018-05-10 VITALS — BP 153/109 | HR 80 | Temp 98.7°F | Resp 16 | Wt 293.2 lb

## 2018-05-10 DIAGNOSIS — I5032 Chronic diastolic (congestive) heart failure: Secondary | ICD-10-CM | POA: Insufficient documentation

## 2018-05-10 DIAGNOSIS — I10 Essential (primary) hypertension: Secondary | ICD-10-CM

## 2018-05-10 DIAGNOSIS — E785 Hyperlipidemia, unspecified: Secondary | ICD-10-CM | POA: Insufficient documentation

## 2018-05-10 DIAGNOSIS — E039 Hypothyroidism, unspecified: Secondary | ICD-10-CM

## 2018-05-10 DIAGNOSIS — G5603 Carpal tunnel syndrome, bilateral upper limbs: Secondary | ICD-10-CM | POA: Insufficient documentation

## 2018-05-10 DIAGNOSIS — Z794 Long term (current) use of insulin: Secondary | ICD-10-CM | POA: Insufficient documentation

## 2018-05-10 DIAGNOSIS — E1165 Type 2 diabetes mellitus with hyperglycemia: Secondary | ICD-10-CM

## 2018-05-10 DIAGNOSIS — Z955 Presence of coronary angioplasty implant and graft: Secondary | ICD-10-CM | POA: Insufficient documentation

## 2018-05-10 DIAGNOSIS — Z79899 Other long term (current) drug therapy: Secondary | ICD-10-CM | POA: Insufficient documentation

## 2018-05-10 DIAGNOSIS — Z886 Allergy status to analgesic agent status: Secondary | ICD-10-CM | POA: Insufficient documentation

## 2018-05-10 DIAGNOSIS — Z9889 Other specified postprocedural states: Secondary | ICD-10-CM | POA: Insufficient documentation

## 2018-05-10 DIAGNOSIS — K529 Noninfective gastroenteritis and colitis, unspecified: Secondary | ICD-10-CM

## 2018-05-10 DIAGNOSIS — I2510 Atherosclerotic heart disease of native coronary artery without angina pectoris: Secondary | ICD-10-CM | POA: Insufficient documentation

## 2018-05-10 DIAGNOSIS — I110 Hypertensive heart disease with heart failure: Secondary | ICD-10-CM | POA: Insufficient documentation

## 2018-05-10 DIAGNOSIS — Z79890 Hormone replacement therapy (postmenopausal): Secondary | ICD-10-CM | POA: Insufficient documentation

## 2018-05-10 DIAGNOSIS — Z7982 Long term (current) use of aspirin: Secondary | ICD-10-CM | POA: Insufficient documentation

## 2018-05-10 DIAGNOSIS — G4733 Obstructive sleep apnea (adult) (pediatric): Secondary | ICD-10-CM | POA: Insufficient documentation

## 2018-05-10 DIAGNOSIS — N400 Enlarged prostate without lower urinary tract symptoms: Secondary | ICD-10-CM | POA: Insufficient documentation

## 2018-05-10 DIAGNOSIS — K219 Gastro-esophageal reflux disease without esophagitis: Secondary | ICD-10-CM | POA: Insufficient documentation

## 2018-05-10 DIAGNOSIS — Z87891 Personal history of nicotine dependence: Secondary | ICD-10-CM | POA: Insufficient documentation

## 2018-05-10 DIAGNOSIS — Z6841 Body Mass Index (BMI) 40.0 and over, adult: Secondary | ICD-10-CM | POA: Insufficient documentation

## 2018-05-10 LAB — POCT GLYCOSYLATED HEMOGLOBIN (HGB A1C): HbA1c, POC (controlled diabetic range): 11.9 % — AB (ref 0.0–7.0)

## 2018-05-10 LAB — GLUCOSE, POCT (MANUAL RESULT ENTRY): POC Glucose: 300 mg/dl — AB (ref 70–99)

## 2018-05-10 MED ORDER — INSULIN GLARGINE 100 UNIT/ML SOLOSTAR PEN: 64.0000 [IU] | PEN_INJECTOR | Freq: Every day | SUBCUTANEOUS | 11 refills | Status: DC

## 2018-05-10 MED ORDER — INSULIN GLARGINE 100 UNIT/ML SOLOSTAR PEN: 64.0000 [IU] | PEN_INJECTOR | Freq: Two times a day (BID) | SUBCUTANEOUS | 11 refills | Status: DC

## 2018-05-10 MED ORDER — INSULIN LISPRO (1 UNIT DIAL) 100 UNIT/ML (KWIKPEN): 38.0000 [IU] | PEN_INJECTOR | Freq: Three times a day (TID) | SUBCUTANEOUS | 11 refills | Status: DC

## 2018-05-10 MED ORDER — PEN NEEDLES 31G X 6 MM MISC: 6 refills | Status: DC

## 2018-05-10 MED FILL — TRUEPLUS PEN NDL 31GX3/16: 31G X 5 MM | 25 days supply | Qty: 100 | Fill #0

## 2018-05-10 MED FILL — !HUMALOG 100 UNITS/ML KWIKP: 100 | 26 days supply | Qty: 30 | Fill #0

## 2018-05-10 MED FILL — TRUEPLUS PEN NDL 31GX3/16": 31G X 5 MM | 25 days supply | Qty: 100 | Fill #0

## 2018-05-10 NOTE — Patient Instructions (Signed)
Given appointment with the clinical pharmacist in 1 week for titration of insulin.  Increase Lantus insulin from 55 units twice a day to 64 units twice a day. Increase Humalog from 30 units with meals to 38 units with meals. Continue to monitor blood sugars.  Please bring in your blood sugar log with you in 1 week to meet with the clinical pharmacist. Please call the endocrinologist back to schedule an appointment.  Purchase Pepto-Bismol over-the-counter and use as needed for the diarrhea.

## 2018-05-10 NOTE — Progress Notes (Signed)
Patient ID: Marc Walker, male    DOB: [birth year]  MRN: [id number]  CC: Diabetes and Hypertension   Subjective: Marc Walker is a 47 y.o. male who presents for f/u DM His concerns today include:  47 year old male with history of CAD (8stents), DM type II, HL, HTN, obesity, OSA on CPAP, tobacco dependence, BPH, hypothyroid.  C/o burping and loose diarrhea x 2 days.  Started after he ate piece of chicken at a restaurant 2 days ago. Diarrhea Q 1-2 hrs yesterday but none so far this morning. No abdominal cramps, N/V.  No blood in stools.  Drinking a lot of water.   Reportedly stopped Lasix 1 wk ago because "I keep urinating all the time even without the Lasix."  No LE edema, PND or orthopnea.  No lower extremity edema. SOB only with exertion.    DM:  BS still high.  Checking BS 5 x a day.  Range 250-300 even in mornings.  Mother does the cooking.  He reports that she is more mindful of his DM.  Snack on chips and salsa.  Drinks mainly water but admits today that he drank some orange juice. -Lantus increased to 55 units twice a day and NovoLog to 30 units with meals on last visit.  He reports compliance with medications. Referred to endocrinology on last visit.  He states he was called but he told them he would call them back as he just started a new job and needed to find out his schedule.  He plans to call them back today.  HTN: Blood pressure noted to be elevated today.  He states that he has been out of the metoprolol for a few days but plan to pick it up today.   Hypothyroid: Reports compliance with levothyroxine.  Patient Active Problem List   Diagnosis Date Noted  . CAD S/P multiple PCI's 08/09/2017  . Prolonged QT interval 05/14/2017  . Wide-complex tachycardia (HCC) 05/14/2017  . GERD (gastroesophageal reflux disease) 05/13/2017  . Hypothyroidism   . Bilateral carpal tunnel syndrome 04/08/2017  . Chronic diastolic CHF (congestive heart failure) (HCC) 03/09/2017  . Erectile  dysfunction 03/02/2017  . OSA on CPAP 07/06/2016  . Morbid obesity (HCC) 07/06/2016  . DM type 2 with diabetic peripheral neuropathy (HCC) 12/16/2015  . CAD (coronary artery disease) 09/30/2015  . Essential hypertension 09/16/2015  . Hyperlipidemia      Current Outpatient Medications on File Prior to Visit  Medication Sig Dispense Refill  . aspirin 81 MG tablet Take 1 tablet (81 mg total) by mouth daily. 90 tablet 3  . clobetasol (TEMOVATE) 0.05 % external solution Apply 1 application topically 2 (two) times daily. Use for 5 days then PRN (Patient not taking: Reported on 03/21/2018) 50 mL 0  . clopidogrel (PLAVIX) 75 MG tablet Take 1 tablet (75 mg total) by mouth daily. 90 tablet 3  . empagliflozin (JARDIANCE) 10 MG TABS tablet Take 10 mg by mouth daily. 90 tablet 3  . ezetimibe (ZETIA) 10 MG tablet Take 1 tablet (10 mg total) by mouth daily. 90 tablet 3  . famotidine (PEPCID) 40 MG tablet Take 1 tablet (40 mg total) by mouth at bedtime.    . fenofibrate (TRICOR) 145 MG tablet Take 1 tablet (145 mg total) by mouth daily. 30 tablet 11  . furosemide (LASIX) 80 MG tablet TAKE 1 TABLET 2 TIMES DAILY BY MOUTH  3  . glucose blood test strip Use as instructed 100 each 12  . insulin glargine (LANTUS)  100 UNIT/ML injection Inject 0.6 mLs (60 Units total) into the skin 2 (two) times daily. 30 mL 2  . Insulin Syringes, Disposable, U-100 0.5 ML MISC Use as directed 100 each 11  . ketoconazole (NIZORAL) 2 % shampoo Apply 1 application topically 2 (two) times a week. (Patient not taking: Reported on 05/10/2018) 120 mL 0  . levothyroxine (SYNTHROID, LEVOTHROID) 200 MCG tablet Take 1 tablet (200 mcg total) by mouth daily. Dose increase. 30 tablet 3  . metFORMIN (GLUCOPHAGE) 1000 MG tablet Take 1,000 mg by mouth 2 (two) times daily with a meal.    . metFORMIN (GLUCOPHAGE-XR) 500 MG 24 hr tablet TAKE 2 TABLETS BY MOUTH 2 TIMES DAILY WITH A MEAL. 120 tablet 11  . metoprolol tartrate (LOPRESSOR) 50 MG tablet  Take 1 tablet (50 mg total) by mouth 2 (two) times daily. 180 tablet 3  . nitroGLYCERIN (NITROSTAT) 0.4 MG SL tablet Place 1 tablet (0.4 mg total) under the tongue every 5 (five) minutes as needed for chest pain. (Patient not taking: Reported on 03/21/2018) 30 tablet 12  . NOVOLOG 100 UNIT/ML injection 35 units with meals. 30 mL 11  . pantoprazole (PROTONIX) 40 MG tablet Take 1 tablet (40 mg total) by mouth daily. 90 tablet 3  . potassium chloride SA (K-DUR,KLOR-CON) 20 MEQ tablet Take 40 mEq by mouth 2 (two) times daily.  2  . rosuvastatin (CRESTOR) 40 MG tablet Take 1 tablet (40 mg total) by mouth daily. 90 tablet 3  . sildenafil (VIAGRA) 50 MG tablet TAKE 1 TABLET (50 MG TOTAL) BY MOUTH DAILY AS NEEDED FOR ERECTILE DYSFUNCTION. 10 tablet 3  . TRUEPLUS LANCETS 28G MISC Use as directed 100 each 12   Current Facility-Administered Medications on File Prior to Visit  Medication Dose Route Frequency Provider Last Rate Last Dose  . insulin aspart (novoLOG) injection 10 Units  10 Units Intravenous Once Lynann Bologna, MD        Allergies  Allergen Reactions  . Levofloxacin Rash    Rash on back (not sun exposed)and hypersensitivity to skin on exposed skin/arm Rash on back (not sun exposed)and hypersensitivity to skin on exposed skin/arm  . Nsaids     Avoid due to CKD  . 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  Pt only wants pain medicine if absolutely necessary.    Social History   Socioeconomic History  . Marital status: Divorced    Spouse name: Not on file  . Number of children: Not on file  . Years of education: Not on file  . Highest education level: Not on file  Occupational History  . Occupation: Company secretary  Social Needs  . Financial resource strain: Not on file  . Food insecurity:    Worry: Not on file    Inability: Not on file  . Transportation needs:    Medical: Not on file    Non-medical: Not on file  Tobacco Use  . Smoking status: Former  Smoker    Types: Cigarettes    Last attempt to quit: 01/31/2017    Years since quitting: 1.2  . Smokeless tobacco: Never Used  . Tobacco comment: quit 01/2017  Substance and Sexual Activity  . Alcohol use: No    Alcohol/week: 0.0 standard drinks  . Drug use: No  . Sexual activity: Not on file  Lifestyle  . Physical activity:    Days per week: Not on file    Minutes per session:  Not on file  . Stress: Not on file  Relationships  . Social connections:    Talks on phone: Not on file    Gets together: Not on file    Attends religious service: Not on file    Active member of club or organization: Not on file    Attends meetings of clubs or organizations: Not on file    Relationship status: Not on file  . Intimate partner violence:    Fear of current or ex partner: Not on file    Emotionally abused: Not on file    Physically abused: Not on file    Forced sexual activity: Not on file  Other Topics Concern  . Not on file  Social History Narrative   Adopted. Very little info on biological parents, ?diabetes.   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 Diabetes Maternal Grandmother     Past Surgical History:  Procedure Laterality Date  . APPENDECTOMY    . CARDIAC CATHETERIZATION N/A 09/16/2015   Procedure: Left Heart Cath and Coronary Angiography;  Surgeon: Runell GessJonathan J Berry, MD;  [facility];  Service: Cardiovascular;  Laterality: N/A;  . CARDIAC CATHETERIZATION N/A 09/16/2015   Procedure: Coronary Stent Intervention;  Surgeon: Runell GessJonathan J Berry, MD;  Location: [facility] INVASIVE CV LAB;  Service: Cardiovascular;  Laterality: N/A;  . CORONARY STENT PLACEMENT      ROS: Review of Systems Negative except as above PHYSICAL EXAM: BP (!) 153/109 (BP Location: Right Arm, Patient Position: Sitting, Cuff Size: Large)   Pulse 80   Temp 98.7 F (37.1 C) (Oral)   Resp 16   Wt 293 lb 3.2 oz (133 kg)   SpO2 96%   BMI  48.79 kg/m   Wt Readings from Last 3 Encounters:  05/10/18 293 lb 3.2 oz (133 kg)  04/08/18 287 lb 12.8 oz (130.5 kg)  02/08/18 282 lb (127.9 kg)  BP 144/88  Physical Exam General appearance - alert, well appearing, obese Caucasian male and in no distress Mental status - normal mood, behavior, speech, dress, motor activity, and thought processes Chest - clear to auscultation, no wheezes, rales or rhonchi, symmetric air entry Heart - normal rate, regular rhythm, normal S1, S2, no murmurs, rubs, clicks or gallops Abdomen - soft, nontender, nondistended, no masses or organomegaly Extremities -no lower extremity edema  Results for orders placed or performed in visit on 05/10/18  POCT glucose (manual entry)  Result Value Ref Range   POC Glucose 300 (A) 70 - 99 mg/dl  POCT glycosylated hemoglobin (Hb A1C)  Result Value Ref Range   Hemoglobin A1C     HbA1c POC (<> result, manual entry)     HbA1c, POC (prediabetic range)     HbA1c, POC (controlled diabetic range) 11.9 (A) 0.0 - 7.0 %    ASSESSMENT AND PLAN: 1. Uncontrolled type 2 diabetes mellitus with hyperglycemia (HCC) Dietary counseling given.  Advised to avoid sugary drinks.  Encouraged to eat healthier snacks like fruits rather than chips. Advised him to call the endocrinologist back to schedule his appointment. Increase Lantus to 64 units twice a day and Humalog to 38 units with meals.  Patient wanted the insulin to be changed to the pen form. -We will have him follow-up with our clinical pharmacist in 1 week for further titration until he gets in to see the endocrinologist.  Advised him to keep a log of  blood sugar readings and bring it with him on that visit. - POCT glucose (manual entry) - POCT glycosylated hemoglobin (Hb A1C) - Insulin Glargine (LANTUS SOLOSTAR) 100 UNIT/ML Solostar Pen; Inject 64 Units into the skin twice a day.  Dispense: 5 pen; Refill: 11 - insulin lispro (HUMALOG KWIKPEN) 100 UNIT/ML KwikPen; Inject 0.38  mLs (38 Units total) into the skin 3 (three) times daily. Take with meals  Dispense: 15 mL; Refill: 11 - Insulin Pen Needle (PEN NEEDLES) 31G X 6 MM MISC; Use as directed  Dispense: 100 each; Refill: 6  2. Essential hypertension Not at goal.  He plans to pick up a refill on metoprolol today. He will take furosemide if he starts developing any shortness of breath or lower extremity edema. - Basic Metabolic Panel; Future  3. Gastroenteritis Continue fluids.  Advised to purchase and use Pepto-Bismol  4. Hypothyroidism, unspecified type - TSH; Future     Patient was given the opportunity to ask questions.  Patient verbalized understanding of the plan and was able to repeat key elements of the plan.   Orders Placed This Encounter  Procedures  . POCT glucose (manual entry)  . POCT glycosylated hemoglobin (Hb A1C)     Requested Prescriptions    No prescriptions requested or ordered in this encounter    No follow-ups on file.  Jonah Blue, MD, FACP

## 2018-05-11 ENCOUNTER — Encounter: Payer: Self-pay | Admitting: Internal Medicine

## 2018-05-11 MED FILL — LANTUS SOLOSTAR 100 UNITS/M: 100 | 15 days supply | Qty: 21 | Fill #0

## 2018-05-11 NOTE — Telephone Encounter (Signed)
Patient mychart response

## 2018-05-15 ENCOUNTER — Other Ambulatory Visit: Payer: Self-pay | Admitting: Internal Medicine

## 2018-05-15 DIAGNOSIS — Z9189 Other specified personal risk factors, not elsewhere classified: Secondary | ICD-10-CM

## 2018-05-16 ENCOUNTER — Ambulatory Visit: Payer: Self-pay | Attending: Internal Medicine | Admitting: Pharmacist

## 2018-05-16 DIAGNOSIS — Z794 Long term (current) use of insulin: Secondary | ICD-10-CM | POA: Insufficient documentation

## 2018-05-16 DIAGNOSIS — E1165 Type 2 diabetes mellitus with hyperglycemia: Secondary | ICD-10-CM | POA: Insufficient documentation

## 2018-05-16 DIAGNOSIS — Z7982 Long term (current) use of aspirin: Secondary | ICD-10-CM | POA: Insufficient documentation

## 2018-05-16 DIAGNOSIS — Z79899 Other long term (current) drug therapy: Secondary | ICD-10-CM | POA: Insufficient documentation

## 2018-05-16 LAB — GLUCOSE, POCT (MANUAL RESULT ENTRY): POC Glucose: 134 mg/dl — AB (ref 70–99)

## 2018-05-16 MED FILL — ?EZETIMIBE 10MG TABLET: 10MG | 30 days supply | Qty: 30 | Fill #0

## 2018-05-16 NOTE — Progress Notes (Signed)
    S:   PCP: Dr. Laural BenesJohnson   No chief complaint on file.  Patient arrives in good spirits.  Presents for diabetes management at the request of Dr. Laural BenesJohnson. Patient was referred on 05/10/18.  Dr. Laural BenesJohnson increased his Lantus to 64 units BID and Humalog to 38 units TID with meals.   Patient reports adherence to DM medications. Reports diarrhea with metformin that started last week. Reports symptoms of hyperglycemia. Endorses baseline neuropathy and vision changes.   He does not follow a strict diabetic diet. "I have a problem with potatoes; you call them carbs, I call them good potatoes." Patient denies hypoglycemic events. He denies exercise.   Family/Social History: DM (maternal grandmother), former smoker (quit 01/31/2017), denies alcohol use  Insurance coverage/medication affordability: Self-pay  Current diabetes medications include:  - Humalog 38 units TID w/ meals - Jardiance 10 mg daily - Lantus 64 units daily - Metformin 500 mg XR: 2 tabs BID  O:  POCT glucose: 134  Home fasting CBG: 106 - 219  2 hour post-prandial/random CBG: 186 - 226  Clinical ASCVD: Yes - CAD, MI  Lab Results  Component Value Date   HGBA1C 11.9 (A) 05/10/2018   There were no vitals filed for this visit.  Lipid Panel     Component Value Date/Time   CHOL 440 (H) 04/08/2018 1058   TRIG 1,403 (HH) 04/08/2018 1058   HDL 25 (L) 04/08/2018 1058   CHOLHDL 17.6 (H) 04/08/2018 1058   CHOLHDL 2.9 07/10/2016 0416   VLDL 18 07/10/2016 0416   LDLCALC Comment 04/08/2018 1058    A/P: Diabetes longstanding currently uncontrolled. Patient is able to verbalize appropriate hypoglycemia management plan. Patient is adherent with medication. Control is suboptimal due to dietary indiscretion and physical inactivity.  Pt continues to take Jardiance which is good considering his ASCVD hx. He is not amenable to an additional injection. However, due to its additional benefit, I would advocate for once weekly Trulicity  or daily Victoza. We could possibly see a decreased need for insulin. I recommend to decrease metformin to 1 tablet BID d/t diarrhea and see if this offers improvement. Pt states he has taken before with no issue, and he believes his diarrhea may be due [facility]  Reviewed home sugars. They look marginally improved. Prandial levels are higher near the later part of the day but are consistently above goal across the board. Will increase insulin today with his high A1c and continued symptoms.   -Increased dose of Lantus to 68 units BID. -Increased dose of Humalog to 38 units TID before meals.  -Extensively discussed pathophysiology of DM, recommended lifestyle interventions, dietary effects on glycemic control -Counseled on s/sx of and management of hypoglycemia -HM: current on PNA, tetanus, and influenza vaccines.  -Next A1C anticipated 07/2018.   ASCVD risk - secondary prevention in patient with DM + clinical ASCVD. Last LDL is not controlled. High intensity statin indicated. Aspirin is indicated.   -Continued aspirin 81 mg  -Continued rosuvastatin 40 mg.  -Continued Zetia 10 mg daily.   Written patient instructions provided.  Total time in face to face counseling 30 minutes.   Follow up PCP Clinic Visit in 05/31/18.    Butch PennyLuke Van Ausdall, PharmD, CPP Clinical Pharmacist [facility] [phone]

## 2018-05-16 NOTE — Patient Instructions (Addendum)
Thank you for coming to see me today. Please do the following:  1. Increase Lantus to 68 units 2 times a day.  2. Increase Humalog to 38 units 2 to 3  times a day.  3. Try to take 1 tablet of metformin 2 times a day.  4. Continue checking blood sugars at home.  5. Continue making the lifestyle changes we've discussed together during our visit. Diet and exercise play a significant role in improving your blood sugars.  6. Follow-up with Dr. Laural BenesJohnson.    Hypoglycemia or low blood sugar:   Low blood sugar can happen quickly and may become an emergency if not treated right away.   While this shouldn't happen often, it can be brought upon if you skip a meal or do not eat enough. Also, if your insulin or other diabetes medications are dosed too high, this can cause your blood sugar to go to low.   Warning signs of low blood sugar include: 1. Feeling shaky or dizzy 2. Feeling weak or tired  3. Excessive hunger 4. Feeling anxious or upset  5. Sweating even when you aren't exercising  What to do if I experience low blood sugar? 1. Check your blood sugar with your meter. If lower than 70, proceed to step 2.  2. Treat with 3-4 glucose tablets or 3 packets of regular sugar. If these aren't around, you can try hard candy. Yet another option would be to drink 4 ounces of fruit juice or 6 ounces of REGULAR soda.  3. Re-check your sugar in 15 minutes. If it is still below 70, do what you did in step 2 again. If has come back up, go ahead and eat a snack or small meal at this time.

## 2018-05-17 ENCOUNTER — Encounter: Payer: Self-pay | Admitting: Pharmacist

## 2018-05-23 ENCOUNTER — Other Ambulatory Visit: Payer: Self-pay | Admitting: *Deleted

## 2018-05-23 DIAGNOSIS — I2510 Atherosclerotic heart disease of native coronary artery without angina pectoris: Secondary | ICD-10-CM

## 2018-05-23 DIAGNOSIS — E785 Hyperlipidemia, unspecified: Secondary | ICD-10-CM

## 2018-05-23 LAB — LIPID PANEL
CHOLESTEROL TOTAL: 96 mg/dL — AB (ref 100–199)
Chol/HDL Ratio: 2.3 ratio (ref 0.0–5.0)
HDL: 42 mg/dL (ref >39)
LDL Calculated: 28 mg/dL (ref 0–99)
TRIGLYCERIDES: 132 mg/dL (ref 0–149)
VLDL CHOLESTEROL CAL: 26 mg/dL (ref 5–40)

## 2018-05-23 LAB — HEPATIC FUNCTION PANEL
ALK PHOS: 62 IU/L (ref 39–117)
ALT: 17 IU/L (ref 0–44)
AST: 13 IU/L (ref 0–40)
Albumin: 3.7 g/dL (ref 3.5–5.5)
BILIRUBIN, DIRECT: 0.08 mg/dL (ref 0.00–0.40)
Bilirubin Total: 0.2 mg/dL (ref 0.0–1.2)
Total Protein: 5.7 g/dL — ABNORMAL LOW (ref 6.0–8.5)

## 2018-05-31 ENCOUNTER — Ambulatory Visit: Payer: Self-pay

## 2018-05-31 NOTE — Progress Notes (Deleted)
Patient ID: Marc Walker                 DOB: 1970/08/20                    MRN: [id number]     HPI: Marc Walker is a 47 y.o. male patient referred to lipid clinic by [name], PA. PMH is significant for   How much was vascepa? Commercial insurance?  Current Medications:  Intolerances:  Risk Factors:  LDL goal:   Diet:   Exercise:   Family History:   Social History:   Labs:  Past Medical History:  Diagnosis Date  . Chronic diastolic CHF (congestive heart failure) (HCC) 03/09/2017   Echo 1/18: Mild LVH, EF 60-65, Gr 2 DD, mild LAE; difficult study-no obvious wall motion abnormalities with Definity  . CKD (chronic kidney disease)   . Coronary artery disease    a. s/pprior myocardial infarction, s/p multipleprior stents placedat outside institutions with last intervention 08/2015 at [facility].  . Diabetes mellitus (HCC)   . Drug addiction in remission (HCC)   . Erectile dysfunction 03/02/2017  . Heartburn   . Hyperlipidemia associated with type 2 diabetes mellitus (HCC)   . Hypertension   . Hypothyroidism    a. adm with profound hypothyroidism near [facility] in 04/2017.  . MI (myocardial infarction) (HCC) 07/2009   [facility] in [place], [place] for last 2, 1st one in [place], [place]; total of 6 stents, last one 02/2014   . Morbid obesity (HCC) 07/06/2016  . Noncompliance with therapeutic plan    a. thyroid med -> profound hypothyroidism 04/2017.  Marland Kitchen OSA (obstructive sleep apnea) 07/06/2016   Moderate with AHI 21.8/hr by PSG 08/2012 now on CPAP  . Prolonged QT interval 05/14/2017  . Tobacco abuse   . Wide-complex tachycardia (HCC) 05/14/2017    Current Outpatient Medications on File Prior to Visit  Medication Sig Dispense Refill  . aspirin 81 MG tablet Take 1 tablet (81 mg total) by mouth daily. 90 tablet 3  . clopidogrel (PLAVIX) 75 MG tablet Take 1 tablet (75 mg total) by mouth daily. 90 tablet 3  . empagliflozin (JARDIANCE) 10 MG TABS tablet Take 10 mg by mouth  daily. 90 tablet 3  . ezetimibe (ZETIA) 10 MG tablet Take 1 tablet (10 mg total) by mouth daily. 90 tablet 3  . famotidine (PEPCID) 40 MG tablet Take 1 tablet (40 mg total) by mouth at bedtime.    . fenofibrate (TRICOR) 145 MG tablet Take 1 tablet (145 mg total) by mouth daily. 30 tablet 11  . furosemide (LASIX) 80 MG tablet TAKE 1 TABLET 2 TIMES DAILY BY MOUTH  3  . glucose blood test strip Use as instructed 100 each 12  . Insulin Glargine (LANTUS SOLOSTAR) 100 UNIT/ML Solostar Pen Inject 64 Units into the skin 2 (two) times daily. 5 pen 11  . insulin lispro (HUMALOG KWIKPEN) 100 UNIT/ML KwikPen Inject 0.38 mLs (38 Units total) into the skin 3 (three) times daily. Take with meals 15 mL 11  . Insulin Pen Needle (PEN NEEDLES) 31G X 6 MM MISC Use as directed 100 each 6  . Insulin Syringes, Disposable, U-100 0.5 ML MISC Use as directed 100 each 11  . levothyroxine (SYNTHROID, LEVOTHROID) 200 MCG tablet Take 1 tablet (200 mcg total) by mouth daily. Dose increase. 30 tablet 3  . metFORMIN (GLUCOPHAGE) 1000 MG tablet Take 1,000 mg by mouth 2 (two) times daily with a meal.    .  metFORMIN (GLUCOPHAGE-XR) 500 MG 24 hr tablet TAKE 2 TABLETS BY MOUTH 2 TIMES DAILY WITH A MEAL. 120 tablet 11  . metoprolol tartrate (LOPRESSOR) 50 MG tablet Take 1 tablet (50 mg total) by mouth 2 (two) times daily. 180 tablet 3  . nitroGLYCERIN (NITROSTAT) 0.4 MG SL tablet Place 1 tablet (0.4 mg total) under the tongue every 5 (five) minutes as needed for chest pain. (Patient not taking: Reported on 03/21/2018) 30 tablet 12  . pantoprazole (PROTONIX) 40 MG tablet Take 1 tablet (40 mg total) by mouth daily. 90 tablet 3  . rosuvastatin (CRESTOR) 40 MG tablet Take 1 tablet (40 mg total) by mouth daily. 90 tablet 3  . sildenafil (VIAGRA) 50 MG tablet TAKE 1 TABLET (50 MG TOTAL) BY MOUTH DAILY AS NEEDED FOR ERECTILE DYSFUNCTION. 10 tablet 3  . TRUEPLUS LANCETS 28G MISC Use as directed 100 each 12   No current facility-administered  medications on file prior to visit.     Allergies  Allergen Reactions  . Levofloxacin Rash    Rash on back (not sun exposed)and hypersensitivity to skin on exposed skin/arm Rash on back (not sun exposed)and hypersensitivity to skin on exposed skin/arm  . Nsaids     Avoid due to CKD  . 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  Pt only wants pain medicine if absolutely necessary.    Assessment/Plan:  1. Hyperlipidemia -

## 2018-06-06 MED FILL — !HUMALOG 100 UNITS/ML KWIKP: 100 | 26 days supply | Qty: 30 | Fill #1

## 2018-06-06 MED FILL — METFORMIN HCL ER 500 MG TAB: 500 | 30 days supply | Qty: 120 | Fill #2

## 2018-06-06 MED FILL — !LANTUS SOLOSTAR 100UNITS/M: 100 | 15 days supply | Qty: 21 | Fill #1

## 2018-06-06 MED FILL — ?EZETIMIBE 10MG TABLET: 10MG | 30 days supply | Qty: 30 | Fill #1

## 2018-06-06 MED FILL — METOPROLOL TARTRATE 50 MG T: 50 | 30 days supply | Qty: 60 | Fill #5

## 2018-06-06 MED FILL — ?PANTOPRAZOLE SO DR 40MG TA: 40 | 30 days supply | Qty: 30 | Fill #4

## 2018-06-06 MED FILL — $Viagra 50mg tablet: 50 | 30 days supply | Qty: 10 | Fill #3

## 2018-06-06 MED FILL — TRUE METRIX TEST STRIP: 25 days supply | Qty: 100 | Fill #1

## 2018-06-06 MED FILL — CLOPIDOGREL 75 MG TABLET: 75 | 30 days supply | Qty: 30 | Fill #6

## 2018-06-06 MED FILL — LEVOTHYROXINE 200 MCG TAB: 200 | 30 days supply | Qty: 30 | Fill #1

## 2018-06-06 MED FILL — TRUEplus LANCETS 28G MISC: 25 days supply | Qty: 100 | Fill #4

## 2018-06-07 ENCOUNTER — Encounter: Payer: Self-pay | Admitting: Internal Medicine

## 2018-06-07 NOTE — Telephone Encounter (Signed)
Patient mychart need for today

## 2018-06-08 ENCOUNTER — Encounter: Payer: Self-pay | Admitting: Internal Medicine

## 2018-06-08 NOTE — Progress Notes (Signed)
Letter written for pt

## 2018-06-16 ENCOUNTER — Ambulatory Visit: Payer: Self-pay | Attending: Internal Medicine | Admitting: Internal Medicine

## 2018-06-16 ENCOUNTER — Encounter: Payer: Self-pay | Admitting: Internal Medicine

## 2018-06-16 VITALS — BP 112/74 | HR 76 | Temp 98.0°F | Resp 16 | Wt 295.2 lb

## 2018-06-16 DIAGNOSIS — K219 Gastro-esophageal reflux disease without esophagitis: Secondary | ICD-10-CM | POA: Insufficient documentation

## 2018-06-16 DIAGNOSIS — E785 Hyperlipidemia, unspecified: Secondary | ICD-10-CM | POA: Insufficient documentation

## 2018-06-16 DIAGNOSIS — Z7982 Long term (current) use of aspirin: Secondary | ICD-10-CM | POA: Insufficient documentation

## 2018-06-16 DIAGNOSIS — R05 Cough: Secondary | ICD-10-CM

## 2018-06-16 DIAGNOSIS — N400 Enlarged prostate without lower urinary tract symptoms: Secondary | ICD-10-CM | POA: Insufficient documentation

## 2018-06-16 DIAGNOSIS — N529 Male erectile dysfunction, unspecified: Secondary | ICD-10-CM | POA: Insufficient documentation

## 2018-06-16 DIAGNOSIS — E039 Hypothyroidism, unspecified: Secondary | ICD-10-CM | POA: Insufficient documentation

## 2018-06-16 DIAGNOSIS — I2510 Atherosclerotic heart disease of native coronary artery without angina pectoris: Secondary | ICD-10-CM | POA: Insufficient documentation

## 2018-06-16 DIAGNOSIS — J069 Acute upper respiratory infection, unspecified: Secondary | ICD-10-CM

## 2018-06-16 DIAGNOSIS — Z87891 Personal history of nicotine dependence: Secondary | ICD-10-CM | POA: Insufficient documentation

## 2018-06-16 DIAGNOSIS — Z955 Presence of coronary angioplasty implant and graft: Secondary | ICD-10-CM | POA: Insufficient documentation

## 2018-06-16 DIAGNOSIS — G5603 Carpal tunnel syndrome, bilateral upper limbs: Secondary | ICD-10-CM | POA: Insufficient documentation

## 2018-06-16 DIAGNOSIS — Z794 Long term (current) use of insulin: Secondary | ICD-10-CM | POA: Insufficient documentation

## 2018-06-16 DIAGNOSIS — I110 Hypertensive heart disease with heart failure: Secondary | ICD-10-CM | POA: Insufficient documentation

## 2018-06-16 DIAGNOSIS — Z79899 Other long term (current) drug therapy: Secondary | ICD-10-CM | POA: Insufficient documentation

## 2018-06-16 DIAGNOSIS — R000 Tachycardia, unspecified: Secondary | ICD-10-CM | POA: Insufficient documentation

## 2018-06-16 DIAGNOSIS — I5032 Chronic diastolic (congestive) heart failure: Secondary | ICD-10-CM | POA: Insufficient documentation

## 2018-06-16 DIAGNOSIS — R0981 Nasal congestion: Secondary | ICD-10-CM

## 2018-06-16 DIAGNOSIS — Z7902 Long term (current) use of antithrombotics/antiplatelets: Secondary | ICD-10-CM | POA: Insufficient documentation

## 2018-06-16 DIAGNOSIS — G4733 Obstructive sleep apnea (adult) (pediatric): Secondary | ICD-10-CM | POA: Insufficient documentation

## 2018-06-16 DIAGNOSIS — E1142 Type 2 diabetes mellitus with diabetic polyneuropathy: Secondary | ICD-10-CM | POA: Insufficient documentation

## 2018-06-16 DIAGNOSIS — R0602 Shortness of breath: Secondary | ICD-10-CM | POA: Insufficient documentation

## 2018-06-16 MED ORDER — BENZONATATE 100 MG PO CAPS: 100.0000 mg | ORAL_CAPSULE | Freq: Two times a day (BID) | ORAL | 0 refills | Status: DC | PRN

## 2018-06-16 MED FILL — BENZONATATE 100 MG CAP: 100 | 15 days supply | Qty: 30 | Fill #0

## 2018-06-16 NOTE — Patient Instructions (Signed)
Use the Vicks VapoRub as needed for congestion.  I have sent a [facility] the [facility] for you to use as needed for the cough.

## 2018-06-16 NOTE — Progress Notes (Signed)
Patient ID: Marc Walker, male    DOB: [birth year]  MRN: [id number]  CC: URI   Subjective: Marc Walker is a 47 y.o. male who presents for UC visit His concerns today include:  47 year old male with history of CAD (8stents), DM type II, HL, HTN, obesity, OSA on CPAP, former tobacco dependence, BPH, hypothyroid.  C/o not feeling well x 5 days In terms include rhinorrhea, dry cough, sneezing, congestion SOB with coughing.  No fever Using Vicks Vapor Rub and Alka-Seltzer plus  Patient Active Problem List   Diagnosis Date Noted  . CAD S/P multiple PCI's 08/09/2017  . Prolonged QT interval 05/14/2017  . Wide-complex tachycardia (HCC) 05/14/2017  . GERD (gastroesophageal reflux disease) 05/13/2017  . Hypothyroidism   . Bilateral carpal tunnel syndrome 04/08/2017  . Chronic diastolic CHF (congestive heart failure) (HCC) 03/09/2017  . Erectile dysfunction 03/02/2017  . OSA on CPAP 07/06/2016  . Morbid obesity (HCC) 07/06/2016  . DM type 2 with diabetic peripheral neuropathy (HCC) 12/16/2015  . CAD (coronary artery disease) 09/30/2015  . Essential hypertension 09/16/2015  . Hyperlipidemia      Current Outpatient Medications on File Prior to Visit  Medication Sig Dispense Refill  . aspirin 81 MG tablet Take 1 tablet (81 mg total) by mouth daily. 90 tablet 3  . clopidogrel (PLAVIX) 75 MG tablet Take 1 tablet (75 mg total) by mouth daily. 90 tablet 3  . empagliflozin (JARDIANCE) 10 MG TABS tablet Take 10 mg by mouth daily. 90 tablet 3  . ezetimibe (ZETIA) 10 MG tablet Take 1 tablet (10 mg total) by mouth daily. 90 tablet 3  . famotidine (PEPCID) 40 MG tablet Take 1 tablet (40 mg total) by mouth at bedtime.    . fenofibrate (TRICOR) 145 MG tablet Take 1 tablet (145 mg total) by mouth daily. 30 tablet 11  . furosemide (LASIX) 80 MG tablet TAKE 1 TABLET 2 TIMES DAILY BY MOUTH  3  . glucose blood test strip Use as instructed 100 each 12  . Insulin Glargine (LANTUS SOLOSTAR) 100  UNIT/ML Solostar Pen Inject 64 Units into the skin 2 (two) times daily. 5 pen 11  . insulin lispro (HUMALOG KWIKPEN) 100 UNIT/ML KwikPen Inject 0.38 mLs (38 Units total) into the skin 3 (three) times daily. Take with meals 15 mL 11  . Insulin Pen Needle (PEN NEEDLES) 31G X 6 MM MISC Use as directed 100 each 6  . Insulin Syringes, Disposable, U-100 0.5 ML MISC Use as directed 100 each 11  . levothyroxine (SYNTHROID, LEVOTHROID) 200 MCG tablet Take 1 tablet (200 mcg total) by mouth daily. Dose increase. 30 tablet 3  . metFORMIN (GLUCOPHAGE) 1000 MG tablet Take 1,000 mg by mouth 2 (two) times daily with a meal.    . metFORMIN (GLUCOPHAGE-XR) 500 MG 24 hr tablet TAKE 2 TABLETS BY MOUTH 2 TIMES DAILY WITH A MEAL. 120 tablet 11  . metoprolol tartrate (LOPRESSOR) 50 MG tablet Take 1 tablet (50 mg total) by mouth 2 (two) times daily. 180 tablet 3  . nitroGLYCERIN (NITROSTAT) 0.4 MG SL tablet Place 1 tablet (0.4 mg total) under the tongue every 5 (five) minutes as needed for chest pain. (Patient not taking: Reported on 03/21/2018) 30 tablet 12  . pantoprazole (PROTONIX) 40 MG tablet Take 1 tablet (40 mg total) by mouth daily. 90 tablet 3  . rosuvastatin (CRESTOR) 40 MG tablet Take 1 tablet (40 mg total) by mouth daily. 90 tablet 3  . sildenafil (VIAGRA) 50 MG tablet  TAKE 1 TABLET (50 MG TOTAL) BY MOUTH DAILY AS NEEDED FOR ERECTILE DYSFUNCTION. 10 tablet 3  . TRUEPLUS LANCETS 28G MISC Use as directed 100 each 12   No current facility-administered medications on file prior to visit.     Allergies  Allergen Reactions  . Levofloxacin Rash    Rash on back (not sun exposed)and hypersensitivity to skin on exposed skin/arm Rash on back (not sun exposed)and hypersensitivity to skin on exposed skin/arm  . Nsaids     Avoid due to CKD  . 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  Pt only wants pain medicine if absolutely necessary.    Social History   Socioeconomic History  . Marital  status: Divorced    Spouse name: Not on file  . Number of children: Not on file  . Years of education: Not on file  . Highest education level: Not on file  Occupational History  . Occupation: Company secretary  Social Needs  . Financial resource strain: Not on file  . Food insecurity:    Worry: Not on file    Inability: Not on file  . Transportation needs:    Medical: Not on file    Non-medical: Not on file  Tobacco Use  . Smoking status: Former Smoker    Types: Cigarettes    Last attempt to quit: 01/31/2017    Years since quitting: 1.3  . Smokeless tobacco: Never Used  . Tobacco comment: quit 01/2017  Substance and Sexual Activity  . Alcohol use: No    Alcohol/week: 0.0 standard drinks  . Drug use: No  . Sexual activity: Not on file  Lifestyle  . Physical activity:    Days per week: Not on file    Minutes per session: Not on file  . Stress: Not on file  Relationships  . Social connections:    Talks on phone: Not on file    Gets together: Not on file    Attends religious service: Not on file    Active member of club or organization: Not on file    Attends meetings of clubs or organizations: Not on file    Relationship status: Not on file  . Intimate partner violence:    Fear of current or ex partner: Not on file    Emotionally abused: Not on file    Physically abused: Not on file    Forced sexual activity: Not on file  Other Topics Concern  . Not on file  Social History Narrative   Adopted. Very little info on biological parents, ?diabetes.   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 Diabetes Maternal Grandmother     Past Surgical History:  Procedure Laterality Date  . APPENDECTOMY    . CARDIAC CATHETERIZATION N/A 09/16/2015   Procedure: Left Heart Cath and Coronary Angiography;  Surgeon: Runell Gess, MD;  Location: [facility] INVASIVE CV LAB;  Service: Cardiovascular;  Laterality: N/A;  . CARDIAC  CATHETERIZATION N/A 09/16/2015   Procedure: Coronary Stent Intervention;  Surgeon: Runell Gess, MD;  Location: [facility] INVASIVE CV LAB;  Service: Cardiovascular;  Laterality: N/A;  . CORONARY STENT PLACEMENT      ROS: Review of Systems Negative except as above PHYSICAL EXAM: BP 112/74   Pulse 76   Temp 98 F (36.7 C) (Oral)   Resp 16   Wt 295 lb 3.2  oz (133.9 kg)   SpO2 95%   BMI 49.12 kg/m   Physical Exam  General appearance - alert, well appearing, and in no distress.  He has mild audible congestion Mental status - normal mood, behavior, speech, dress, motor activity, and thought processes Nose -moderate amount of clear mucus in both nostrils Mouth - mucous membranes moist, pharynx normal without lesions Neck - supple, no significant adenopathy Chest - clear to auscultation, no wheezes, rales or rhonchi, symmetric air entry Heart - normal rate, regular rhythm, normal S1, S2, no murmurs, rubs, clicks or gallops   ASSESSMENT AND PLAN: 1. URI with cough and congestion Conservative measures recommended.  Continue Vicks VapoRub.  I have given some Tessalon Perles to use as needed for the cough.  Letter written for work excuse. - benzonatate (TESSALON) 100 MG capsule; Take 1 capsule (100 mg total) by mouth 2 (two) times daily as needed for cough.  Dispense: 30 capsule; Refill: 0   Patient was given the opportunity to ask questions.  Patient verbalized understanding of the plan and was able to repeat key elements of the plan.   No orders of the defined types were placed in this encounter.    Requested Prescriptions   Signed Prescriptions Disp Refills  . benzonatate (TESSALON) 100 MG capsule 30 capsule 0    Sig: Take 1 capsule (100 mg total) by mouth 2 (two) times daily as needed for cough.    Return in about 2 months (around 08/17/2018).  Jonah Blueeborah Ariann Khaimov, MD, FACP

## 2018-07-04 ENCOUNTER — Encounter: Payer: Self-pay | Admitting: Internal Medicine

## 2018-07-05 ENCOUNTER — Ambulatory Visit: Payer: Self-pay | Attending: Internal Medicine | Admitting: Internal Medicine

## 2018-07-05 ENCOUNTER — Encounter: Payer: Self-pay | Admitting: Internal Medicine

## 2018-07-05 VITALS — BP 151/84 | HR 63 | Temp 98.2°F | Resp 16 | Ht 66.0 in | Wt 308.2 lb

## 2018-07-05 DIAGNOSIS — E162 Hypoglycemia, unspecified: Secondary | ICD-10-CM

## 2018-07-05 DIAGNOSIS — E11649 Type 2 diabetes mellitus with hypoglycemia without coma: Secondary | ICD-10-CM | POA: Insufficient documentation

## 2018-07-05 DIAGNOSIS — Z7982 Long term (current) use of aspirin: Secondary | ICD-10-CM | POA: Insufficient documentation

## 2018-07-05 DIAGNOSIS — Z888 Allergy status to other drugs, medicaments and biological substances status: Secondary | ICD-10-CM | POA: Insufficient documentation

## 2018-07-05 DIAGNOSIS — Z833 Family history of diabetes mellitus: Secondary | ICD-10-CM | POA: Insufficient documentation

## 2018-07-05 DIAGNOSIS — Z9989 Dependence on other enabling machines and devices: Secondary | ICD-10-CM

## 2018-07-05 DIAGNOSIS — K219 Gastro-esophageal reflux disease without esophagitis: Secondary | ICD-10-CM | POA: Insufficient documentation

## 2018-07-05 DIAGNOSIS — Z79899 Other long term (current) drug therapy: Secondary | ICD-10-CM | POA: Insufficient documentation

## 2018-07-05 DIAGNOSIS — I5032 Chronic diastolic (congestive) heart failure: Secondary | ICD-10-CM

## 2018-07-05 DIAGNOSIS — I2510 Atherosclerotic heart disease of native coronary artery without angina pectoris: Secondary | ICD-10-CM | POA: Insufficient documentation

## 2018-07-05 DIAGNOSIS — E039 Hypothyroidism, unspecified: Secondary | ICD-10-CM

## 2018-07-05 DIAGNOSIS — Z87891 Personal history of nicotine dependence: Secondary | ICD-10-CM | POA: Insufficient documentation

## 2018-07-05 DIAGNOSIS — Z886 Allergy status to analgesic agent status: Secondary | ICD-10-CM | POA: Insufficient documentation

## 2018-07-05 DIAGNOSIS — Z955 Presence of coronary angioplasty implant and graft: Secondary | ICD-10-CM | POA: Insufficient documentation

## 2018-07-05 DIAGNOSIS — I10 Essential (primary) hypertension: Secondary | ICD-10-CM

## 2018-07-05 DIAGNOSIS — E785 Hyperlipidemia, unspecified: Secondary | ICD-10-CM | POA: Insufficient documentation

## 2018-07-05 DIAGNOSIS — Z881 Allergy status to other antibiotic agents status: Secondary | ICD-10-CM | POA: Insufficient documentation

## 2018-07-05 DIAGNOSIS — N529 Male erectile dysfunction, unspecified: Secondary | ICD-10-CM | POA: Insufficient documentation

## 2018-07-05 DIAGNOSIS — E1142 Type 2 diabetes mellitus with diabetic polyneuropathy: Secondary | ICD-10-CM | POA: Insufficient documentation

## 2018-07-05 DIAGNOSIS — Z6841 Body Mass Index (BMI) 40.0 and over, adult: Secondary | ICD-10-CM | POA: Insufficient documentation

## 2018-07-05 DIAGNOSIS — I110 Hypertensive heart disease with heart failure: Secondary | ICD-10-CM | POA: Insufficient documentation

## 2018-07-05 DIAGNOSIS — Z794 Long term (current) use of insulin: Secondary | ICD-10-CM | POA: Insufficient documentation

## 2018-07-05 DIAGNOSIS — G4733 Obstructive sleep apnea (adult) (pediatric): Secondary | ICD-10-CM

## 2018-07-05 DIAGNOSIS — J209 Acute bronchitis, unspecified: Secondary | ICD-10-CM

## 2018-07-05 DIAGNOSIS — Z79890 Hormone replacement therapy (postmenopausal): Secondary | ICD-10-CM | POA: Insufficient documentation

## 2018-07-05 DIAGNOSIS — Z7902 Long term (current) use of antithrombotics/antiplatelets: Secondary | ICD-10-CM | POA: Insufficient documentation

## 2018-07-05 LAB — GLUCOSE, POCT (MANUAL RESULT ENTRY)
POC Glucose: 52 mg/dl — AB (ref 70–99)
POC Glucose: 78 mg/dl (ref 70–99)
POC Glucose: 111 mg/dl — AB (ref 70–99)
POC Glucose: 58 mg/dl — AB (ref 70–99)

## 2018-07-05 MED ORDER — AZITHROMYCIN 250 MG PO TABS: ORAL_TABLET | ORAL | 0 refills | Status: DC

## 2018-07-05 MED ORDER — BENZONATATE 100 MG PO CAPS: 100.0000 mg | ORAL_CAPSULE | Freq: Two times a day (BID) | ORAL | 0 refills | Status: DC | PRN

## 2018-07-05 MED FILL — AZITHROMYCIN 250 MG TABLET: 250 | 5 days supply | Qty: 6 | Fill #0

## 2018-07-05 MED FILL — BENZONATATE 100 MG CAP: 100 | 15 days supply | Qty: 30 | Fill #0

## 2018-07-05 NOTE — Progress Notes (Signed)
Patient ID: Kasem Mozer, male    DOB: 01/13/1971  MRN: [id number]  CC: Cough   Subjective: Colonel Krauser is a 48 y.o. male who presents for UC visit His concerns today include:  48 year old male with history of CAD (8stents), DM type II, HL, HTN, obesity, OSA on CPAP, former tobacco dependence, BPH, hypothyroid.  Patient presents today for urgent care visit complaining of recurrent upper respiratory symptoms.  He was last seen 06/16/2018 with URI.  Patient was given some Tessalon Perles and told to use Vicks VapoRub for congestion.  Patient reports that he got better but a few days after Christmas symptoms returned and were worse.  Complains of cough, congestion, rhinorrhea and feeling hot.  No increased shortness of breath or documented fever.  Gained 13 lbs since last visit.  "I'm staying hungry all the time."  Some swelling in feet.  He stopped taking Furosemide a few wks ago because he was urinating too much and he was working outside as a Magazine features editor  Also complains of increased daytime sleepiness x 1 mth.  Reports consistent compliance with CPAP.   Patient Active Problem List   Diagnosis Date Noted  . CAD S/P multiple PCI's 08/09/2017  . Prolonged QT interval 05/14/2017  . Wide-complex tachycardia (HCC) 05/14/2017  . GERD (gastroesophageal reflux disease) 05/13/2017  . Hypothyroidism   . Bilateral carpal tunnel syndrome 04/08/2017  . Chronic diastolic CHF (congestive heart failure) (HCC) 03/09/2017  . Erectile dysfunction 03/02/2017  . OSA on CPAP 07/06/2016  . Morbid obesity (HCC) 07/06/2016  . DM type 2 with diabetic peripheral neuropathy (HCC) 12/16/2015  . CAD (coronary artery disease) 09/30/2015  . Essential hypertension 09/16/2015  . Hyperlipidemia      Current Outpatient Medications on File Prior to Visit  Medication Sig Dispense Refill  . aspirin 81 MG tablet Take 1 tablet (81 mg total) by mouth daily. 90 tablet 3  . clopidogrel (PLAVIX) 75  MG tablet Take 1 tablet (75 mg total) by mouth daily. 90 tablet 3  . empagliflozin (JARDIANCE) 10 MG TABS tablet Take 10 mg by mouth daily. 90 tablet 3  . ezetimibe (ZETIA) 10 MG tablet Take 1 tablet (10 mg total) by mouth daily. 90 tablet 3  . famotidine (PEPCID) 40 MG tablet Take 1 tablet (40 mg total) by mouth at bedtime.    . fenofibrate (TRICOR) 145 MG tablet Take 1 tablet (145 mg total) by mouth daily. 30 tablet 11  . furosemide (LASIX) 80 MG tablet TAKE 1 TABLET 2 TIMES DAILY BY MOUTH  3  . glucose blood test strip Use as instructed 100 each 12  . Insulin Glargine (LANTUS SOLOSTAR) 100 UNIT/ML Solostar Pen Inject 64 Units into the skin 2 (two) times daily. 5 pen 11  . insulin lispro (HUMALOG KWIKPEN) 100 UNIT/ML KwikPen Inject 0.38 mLs (38 Units total) into the skin 3 (three) times daily. Take with meals 15 mL 11  . Insulin Pen Needle (PEN NEEDLES) 31G X 6 MM MISC Use as directed 100 each 6  . Insulin Syringes, Disposable, U-100 0.5 ML MISC Use as directed 100 each 11  . levothyroxine (SYNTHROID, LEVOTHROID) 200 MCG tablet Take 1 tablet (200 mcg total) by mouth daily. Dose increase. 30 tablet 3  . metFORMIN (GLUCOPHAGE) 1000 MG tablet Take 1,000 mg by mouth 2 (two) times daily with a meal.    . metFORMIN (GLUCOPHAGE-XR) 500 MG 24 hr tablet TAKE 2 TABLETS BY MOUTH 2 TIMES DAILY WITH A MEAL.  120 tablet 11  . metoprolol tartrate (LOPRESSOR) 50 MG tablet Take 1 tablet (50 mg total) by mouth 2 (two) times daily. 180 tablet 3  . nitroGLYCERIN (NITROSTAT) 0.4 MG SL tablet Place 1 tablet (0.4 mg total) under the tongue every 5 (five) minutes as needed for chest pain. (Patient not taking: Reported on 03/21/2018) 30 tablet 12  . pantoprazole (PROTONIX) 40 MG tablet Take 1 tablet (40 mg total) by mouth daily. 90 tablet 3  . rosuvastatin (CRESTOR) 40 MG tablet Take 1 tablet (40 mg total) by mouth daily. 90 tablet 3  . sildenafil (VIAGRA) 50 MG tablet TAKE 1 TABLET (50 MG TOTAL) BY MOUTH DAILY AS NEEDED  FOR ERECTILE DYSFUNCTION. 10 tablet 3  . TRUEPLUS LANCETS 28G MISC Use as directed 100 each 12   No current facility-administered medications on file prior to visit.     Allergies  Allergen Reactions  . Levofloxacin Rash    Rash on back (not sun exposed)and hypersensitivity to skin on exposed skin/arm Rash on back (not sun exposed)and hypersensitivity to skin on exposed skin/arm  . Nsaids     Avoid due to CKD  . 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  Pt only wants pain medicine if absolutely necessary.    Social History   Socioeconomic History  . Marital status: Divorced    Spouse name: Not on file  . Number of children: Not on file  . Years of education: Not on file  . Highest education level: Not on file  Occupational History  . 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 Financial resource strain: Not on file  . Food insecurity:    Worry: Not on file    Inability: Not on file  . Transportation needs:    Medical: Not on file    Non-medical: Not on file  Tobacco Use  . Smoking status: Former Smoker    Types: Cigarettes    Last attempt to quit: 01/31/2017    Years since quitting: 1.4  . Smokeless tobacco: Never Used  . Tobacco comment: quit 01/2017  Substance and Sexual Activity  . Alcohol use: No    Alcohol/week: 0.0 standard drinks  . Drug use: No  . Sexual activity: Not on file  Lifestyle  . Physical activity:    Days per week: Not on file    Minutes per session: Not on file  . Stress: Not on file  Relationships  . Social connections:    Talks on phone: Not on file    Gets together: Not on file    Attends religious service: Not on file    Active member of club or organization: Not on file    Attends meetings of clubs or organizations: Not on file    Relationship status: Not on file  . Intimate partner violence:    Fear of current or ex partner: Not on file    Emotionally abused: Not on file    Physically abused: Not on file     Forced sexual activity: Not on file  Other Topics Concern  . Not on file  Social History Narrative   Adopted. Very little info on biological parents, ?diabetes.   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 Diabetes Maternal Grandmother     Past Surgical History:  Procedure Laterality Date  . APPENDECTOMY    .  CARDIAC CATHETERIZATION N/A 09/16/2015   Procedure: Left Heart Cath and Coronary Angiography;  Surgeon: Runell Gess, MD;  Location: [facility] INVASIVE CV LAB;  Service: Cardiovascular;  Laterality: N/A;  . CARDIAC CATHETERIZATION N/A 09/16/2015   Procedure: Coronary Stent Intervention;  Surgeon: Runell Gess, MD;  Location: [facility] INVASIVE CV LAB;  Service: Cardiovascular;  Laterality: N/A;  . CORONARY STENT PLACEMENT      ROS: Review of Systems Negative except as above PHYSICAL EXAM: BP (!) 151/84   Pulse 63   Temp 98.2 F (36.8 C) (Oral)   Resp 16   Ht 5\' 6"  (1.676 m)   Wt (!) 308 lb 3.2 oz (139.8 kg)   SpO2 97%   BMI 49.74 kg/m   Wt Readings from Last 3 Encounters:  07/05/18 (!) 308 lb 3.2 oz (139.8 kg)  06/16/18 295 lb 3.2 oz (133.9 kg)  05/10/18 293 lb 3.2 oz (133 kg)    Physical Exam  General appearance -patient appeared tired and a little drowsy but answered questions appropriately.  Patient was diaphoretic Mouth - mucous membranes moist, pharynx normal without lesions Neck - supple, no significant adenopathy Chest -fine crackles heard at the left base.  Other lung fields are clear Heart - normal rate, regular rhythm, normal S1, S2, no murmurs, rubs, clicks or gallops.  JVD difficult to appreciate due to large body habitus Extremities -trace to 1+ lower extremity edema  Blood sugar 55. Repeat blood sugar after having some crackers and a can of Sprite was 58.  Patient given a tube of glucose Repeat BP prior to leaving was 111 ASSESSMENT AND PLAN: 1. Acute bronchitis, unspecified organism Versus  early pneumonia in the left lower lung.  I recommend going to radiology for x-ray today but patient declined stating he feels too tired.  Started on Zithromax and [employer]. - CBC with Differential/Platelet - azithromycin (ZITHROMAX) 250 MG tablet; 2 tabs PO x 1 then 1 tab PO daily  Dispense: 6 tablet; Refill: 0 - benzonatate (TESSALON) 100 MG capsule; Take 1 capsule (100 mg total) by mouth 2 (two) times daily as needed for cough.  Dispense: 30 capsule; Refill: 0  2. Hypoglycemia Patient reports no hypoglycemic episodes since last visit with me prior to today.  He states that his blood sugar this morning was 168 and he ate a bowl of cereal prior to coming. We will have him continue to monitor blood sugars and bring in readings on next visit. - Glucose (CBG) - Glucose (CBG) - POCT glucose (manual entry)  3. Chronic diastolic CHF (congestive heart failure) (HCC) Advised to restart furosemide and limit salt in the foods. - Brain natriuretic peptide - Basic Metabolic Panel  4. Essential hypertension Continue metoprolol and restart furosemide  5. Morbid obesity (HCC) Dietary counseling discussed.  Encourage physical activity.  6. Acquired hypothyroidism - TSH  7. OSA on CPAP Given his report of increased daytime sleepiness over the past month, will refer him to a sleep specialist to see whether he needs repeat sleep study.  He will continue CPAP - Ambulatory referral to Pulmonology   Patient was given the opportunity to ask questions.  Patient verbalized understanding of the plan and was able to repeat key elements of the plan.   Orders Placed This Encounter  Procedures  . TSH  . Brain natriuretic peptide  . CBC with Differential/Platelet  . Basic Metabolic Panel  . Ambulatory referral to Pulmonology  . Glucose (CBG)  . Glucose (CBG)  . POCT glucose (  manual entry)     Requested Prescriptions   Signed Prescriptions Disp Refills  . azithromycin (ZITHROMAX) 250 MG  tablet 6 tablet 0    Sig: 2 tabs PO x 1 then 1 tab PO daily  . benzonatate (TESSALON) 100 MG capsule 30 capsule 0    Sig: Take 1 capsule (100 mg total) by mouth 2 (two) times daily as needed for cough.    No follow-ups on file.  Jonah Blue, MD, FACP

## 2018-07-05 NOTE — Patient Instructions (Signed)
Please restart furosemide.  Continue to limit salt in the foods.  I have sent prescription to the pharmacy for an antibiotic.  I have referred you to a sleep specialist.

## 2018-07-06 LAB — CBC WITH DIFFERENTIAL/PLATELET
BASOS ABS: 0.1 10*3/uL (ref 0.0–0.2)
Basos: 1 %
EOS (ABSOLUTE): 0.9 10*3/uL — AB (ref 0.0–0.4)
Eos: 6 %
HEMOGLOBIN: 13.8 g/dL (ref 13.0–17.7)
Hematocrit: 43.1 % (ref 37.5–51.0)
Immature Grans (Abs): 0 10*3/uL (ref 0.0–0.1)
Immature Granulocytes: 0 %
LYMPHS ABS: 5.7 10*3/uL — AB (ref 0.7–3.1)
Lymphs: 34 %
MCH: 27.5 pg (ref 26.6–33.0)
MCHC: 32 g/dL (ref 31.5–35.7)
MCV: 86 fL (ref 79–97)
MONOS ABS: 2.2 10*3/uL — AB (ref 0.1–0.9)
Monocytes: 13 %
NEUTROS ABS: 7.6 10*3/uL — AB (ref 1.4–7.0)
Neutrophils: 46 %
Platelets: 321 10*3/uL (ref 150–450)
RBC: 5.01 x10E6/uL (ref 4.14–5.80)
RDW: 14.5 % (ref 11.6–15.4)
WBC: 16.6 10*3/uL — ABNORMAL HIGH (ref 3.4–10.8)

## 2018-07-06 LAB — BASIC METABOLIC PANEL
BUN/Creatinine Ratio: 18 (ref 9–20)
BUN: 13 mg/dL (ref 6–24)
CO2: 22 mmol/L (ref 20–29)
Calcium: 9.5 mg/dL (ref 8.7–10.2)
Chloride: 106 mmol/L (ref 96–106)
Creatinine, Ser: 0.74 mg/dL — ABNORMAL LOW (ref 0.76–1.27)
GFR calc Af Amer: 125 mL/min/{1.73_m2} (ref >59)
GFR calc non Af Amer: 108 mL/min/{1.73_m2} (ref >59)
GLUCOSE: 46 mg/dL — AB (ref 65–99)
Potassium: 3.5 mmol/L (ref 3.5–5.2)
Sodium: 144 mmol/L (ref 134–144)

## 2018-07-06 LAB — BRAIN NATRIURETIC PEPTIDE: BNP: 91.6 pg/mL (ref 0.0–100.0)

## 2018-07-06 LAB — TSH: TSH: 2.43 u[IU]/mL (ref 0.450–4.500)

## 2018-07-09 ENCOUNTER — Encounter (HOSPITAL_BASED_OUTPATIENT_CLINIC_OR_DEPARTMENT_OTHER): Payer: Self-pay | Admitting: Emergency Medicine

## 2018-07-09 ENCOUNTER — Emergency Department (HOSPITAL_BASED_OUTPATIENT_CLINIC_OR_DEPARTMENT_OTHER): Payer: Self-pay

## 2018-07-09 ENCOUNTER — Emergency Department (HOSPITAL_BASED_OUTPATIENT_CLINIC_OR_DEPARTMENT_OTHER)
Admission: EM | Admit: 2018-07-09 | Discharge: 2018-07-09 | Disposition: A | Discharge status: Home / Self Care | Payer: Self-pay | Attending: Emergency Medicine | Admitting: Emergency Medicine

## 2018-07-09 ENCOUNTER — Other Ambulatory Visit: Payer: Self-pay

## 2018-07-09 DIAGNOSIS — Z79899 Other long term (current) drug therapy: Secondary | ICD-10-CM | POA: Insufficient documentation

## 2018-07-09 DIAGNOSIS — Z794 Long term (current) use of insulin: Secondary | ICD-10-CM | POA: Insufficient documentation

## 2018-07-09 DIAGNOSIS — E1122 Type 2 diabetes mellitus with diabetic chronic kidney disease: Secondary | ICD-10-CM | POA: Insufficient documentation

## 2018-07-09 DIAGNOSIS — I5032 Chronic diastolic (congestive) heart failure: Secondary | ICD-10-CM | POA: Insufficient documentation

## 2018-07-09 DIAGNOSIS — I129 Hypertensive chronic kidney disease with stage 1 through stage 4 chronic kidney disease, or unspecified chronic kidney disease: Secondary | ICD-10-CM | POA: Insufficient documentation

## 2018-07-09 DIAGNOSIS — I2510 Atherosclerotic heart disease of native coronary artery without angina pectoris: Secondary | ICD-10-CM | POA: Insufficient documentation

## 2018-07-09 DIAGNOSIS — Z87891 Personal history of nicotine dependence: Secondary | ICD-10-CM | POA: Insufficient documentation

## 2018-07-09 DIAGNOSIS — N189 Chronic kidney disease, unspecified: Secondary | ICD-10-CM | POA: Insufficient documentation

## 2018-07-09 DIAGNOSIS — E039 Hypothyroidism, unspecified: Secondary | ICD-10-CM | POA: Insufficient documentation

## 2018-07-09 DIAGNOSIS — Z7902 Long term (current) use of antithrombotics/antiplatelets: Secondary | ICD-10-CM | POA: Insufficient documentation

## 2018-07-09 DIAGNOSIS — I509 Heart failure, unspecified: Secondary | ICD-10-CM

## 2018-07-09 DIAGNOSIS — Z955 Presence of coronary angioplasty implant and graft: Secondary | ICD-10-CM | POA: Insufficient documentation

## 2018-07-09 DIAGNOSIS — I130 Hypertensive heart and chronic kidney disease with heart failure and stage 1 through stage 4 chronic kidney disease, or unspecified chronic kidney disease: Secondary | ICD-10-CM | POA: Insufficient documentation

## 2018-07-09 DIAGNOSIS — Z7982 Long term (current) use of aspirin: Secondary | ICD-10-CM | POA: Insufficient documentation

## 2018-07-09 DIAGNOSIS — I252 Old myocardial infarction: Secondary | ICD-10-CM | POA: Insufficient documentation

## 2018-07-09 LAB — CBC WITH DIFFERENTIAL/PLATELET
Abs Immature Granulocytes: 0.04 10*3/uL (ref 0.00–0.07)
Basophils Absolute: 0.1 10*3/uL (ref 0.0–0.1)
Basophils Relative: 1 %
Eosinophils Absolute: 0.7 10*3/uL — ABNORMAL HIGH (ref 0.0–0.5)
Eosinophils Relative: 7 %
HCT: 42.7 % (ref 39.0–52.0)
Hemoglobin: 13.1 g/dL (ref 13.0–17.0)
Immature Granulocytes: 0 %
Lymphocytes Relative: 24 %
Lymphs Abs: 2.4 10*3/uL (ref 0.7–4.0)
MCH: 27.2 pg (ref 26.0–34.0)
MCHC: 30.7 g/dL (ref 30.0–36.0)
MCV: 88.8 fL (ref 80.0–100.0)
Monocytes Absolute: 0.9 10*3/uL (ref 0.1–1.0)
Monocytes Relative: 9 %
Neutro Abs: 6 10*3/uL (ref 1.7–7.7)
Neutrophils Relative %: 59 %
Platelets: 271 10*3/uL (ref 150–400)
RBC: 4.81 MIL/uL (ref 4.22–5.81)
RDW: 14.8 % (ref 11.5–15.5)
WBC: 10 10*3/uL (ref 4.0–10.5)
nRBC: 0 % (ref 0.0–0.2)

## 2018-07-09 LAB — TROPONIN I

## 2018-07-09 LAB — BASIC METABOLIC PANEL
Anion gap: 10 (ref 5–15)
BUN: 14 mg/dL (ref 6–20)
CO2: 24 mmol/L (ref 22–32)
CREATININE: 0.82 mg/dL (ref 0.61–1.24)
Calcium: 9.2 mg/dL (ref 8.9–10.3)
Chloride: 103 mmol/L (ref 98–111)
GFR calc Af Amer: >60 mL/min (ref >60)
GFR calc non Af Amer: >60 mL/min (ref >60)
Glucose, Bld: 95 mg/dL (ref 70–99)
Potassium: 3.7 mmol/L (ref 3.5–5.1)
Sodium: 137 mmol/L (ref 135–145)

## 2018-07-09 LAB — BRAIN NATRIURETIC PEPTIDE: B Natriuretic Peptide: 264.5 pg/mL — ABNORMAL HIGH (ref 0.0–100.0)

## 2018-07-09 MED ORDER — FUROSEMIDE 10 MG/ML IJ SOLN
80.0000 mg | Freq: Once | INTRAMUSCULAR | Status: AC
  Administered 2018-07-09: 80 mg via INTRAVENOUS
  Filled 2018-07-09: qty 8

## 2018-07-09 NOTE — ED Provider Notes (Signed)
[facility] EMERGENCY DEPARTMENT Provider Note   CSN: [id number] Arrival date & time: 07/09/18  1513   History   Chief Complaint Chief Complaint  Patient presents with  . Foot Pain    HPI Marc Walker is a 48 y.o. male with PMH of CHF, CKD, CAD status post stenting, T2DM, hypertension, hypothyroidism, and OSA.  Patient reports that he has had worsening bilateral foot pain since Tuesday evening.  Reports that he went to the doctor on Tuesday where he was treated for possible pneumonia given that he had increased cough and shortness of breath.  At that time it was noted that he had gained 13 pounds from previous office visit on 1/07.  Patient has gained an additional 11 pounds here in ED and reported at home that he weighed 319 pounds Yesterday.patient notes that he has had increased dyspnea on exertion as well.  Does not note any improvement after antibiotics for CAP.  Patient reports that he has noticed increased swelling in his feet which he is attributing to his foot pain.  Patient reports that at home he sleeps on 2 pillows with a wedge pillow at night and this has not increased recently.  Does report that he has was previously on furosemide 80 mg twice daily but he stopped this about 6 months ago due to not noticing a difference in the amount he was urinating and did not believe that was helping. Denies any chest pain, orthopnea, fevers, chills.  Reports dyspnea on exertion with increased leg swelling.  The history is provided by the patient.    Past Medical History:  Diagnosis Date  . Chronic diastolic CHF (congestive heart failure) (HCC) 03/09/2017   Echo 1/18: Mild LVH, EF 60-65, Gr 2 DD, mild LAE; difficult study-no obvious wall motion abnormalities with Definity  . CKD (chronic kidney disease)   . Coronary artery disease    a. s/pprior myocardial infarction, s/p multipleprior stents placedat outside institutions with [facility].  . Diabetes  mellitus (HCC)   . Drug addiction in remission (HCC)   . Erectile dysfunction 03/02/2017  . Heartburn   . Hyperlipidemia associated with type 2 diabetes mellitus (HCC)   . Hypertension   . Hypothyroidism    a. [facility]  . MI (myocardial infarction) (HCC) 07/2009   [facility] [place], [place] for last 2, [place]st one in [place]; total of 6 stents, last one 02/2014   . Morbid obesity (HCC) 07/06/2016  . Noncompliance with therapeutic plan    a. thyroid med -> profound hypothyroidism 04/2017.  Marland Kitchen. OSA (obstructive sleep apnea) 07/06/2016   Moderate with AHI 21.8/hr by PSG 08/2012 now on CPAP  . Prolonged QT interval 05/14/2017  . Tobacco abuse   . Wide-complex tachycardia (HCC) 05/14/2017    Patient Active Problem List   Diagnosis Date Noted  . CAD S/P multiple PCI's 08/09/2017  . Prolonged QT interval 05/14/2017  . Wide-complex tachycardia (HCC) 05/14/2017  . GERD (gastroesophageal reflux disease) 05/13/2017  . Hypothyroidism   . Bilateral carpal tunnel syndrome 04/08/2017  . Chronic diastolic CHF (congestive heart failure) (HCC) 03/09/2017  . Erectile dysfunction 03/02/2017  . OSA on CPAP 07/06/2016  . Morbid obesity (HCC) 07/06/2016  . DM type 2 with diabetic peripheral neuropathy (HCC) 12/16/2015  . CAD (coronary artery disease) 09/30/2015  . Essential hypertension 09/16/2015  . Hyperlipidemia     Past Surgical History:  Procedure Laterality Date  .  APPENDECTOMY    . CARDIAC CATHETERIZATION N/A 09/16/2015   Procedure: Left Heart Cath and Coronary Angiography;  Surgeon: Runell Gess, MD;  Location: [facility] INVASIVE CV LAB;  Service: Cardiovascular;  Laterality: N/A;  . CARDIAC CATHETERIZATION N/A 09/16/2015   Procedure: Coronary Stent Intervention;  Surgeon: Runell Gess, MD;  Location: [facility] INVASIVE CV LAB;  Service: Cardiovascular;  Laterality: N/A;  . CORONARY STENT PLACEMENT        Home Medications    Prior to  Admission medications   Medication Sig Start Date End Date Taking? Authorizing Provider  aspirin 81 MG tablet Take 1 tablet (81 mg total) by mouth daily. 11/17/16   Pete Glatter, MD  azithromycin (ZITHROMAX) 250 MG tablet 2 tabs PO x 1 then 1 tab PO daily 07/05/18   Marcine Matar, MD  benzonatate (TESSALON) 100 MG capsule Take 1 capsule (100 mg total) by mouth 2 (two) times daily as needed for cough. 07/05/18   Marcine Matar, MD  clopidogrel (PLAVIX) 75 MG tablet Take 1 tablet (75 mg total) by mouth daily. 11/29/17   Pricilla Riffle, MD  empagliflozin (JARDIANCE) 10 MG TABS tablet Take 10 mg by mouth daily. 04/08/18   Marcine Matar, MD  ezetimibe (ZETIA) 10 MG tablet Take 1 tablet (10 mg total) by mouth daily. 04/08/18   Marcine Matar, MD  famotidine (PEPCID) 40 MG tablet Take 1 tablet (40 mg total) by mouth at bedtime. 02/03/18   [name] T, PA-C  fenofibrate (TRICOR) 145 MG tablet Take 1 tablet (145 mg total) by mouth daily. 04/12/18   [name] T, PA-C  furosemide (LASIX) 80 MG tablet TAKE 1 TABLET 2 TIMES DAILY BY MOUTH 08/30/17   [provider]  glucose blood test strip Use as instructed 05/09/18   Marcine Matar, MD  Insulin Glargine (LANTUS SOLOSTAR) 100 UNIT/ML Solostar Pen Inject 64 Units into the skin 2 (two) times daily. 05/10/18   Marcine Matar, MD  insulin lispro (HUMALOG KWIKPEN) 100 UNIT/ML KwikPen Inject 0.38 mLs (38 Units total) into the skin 3 (three) times daily. Take with meals 05/10/18   Marcine Matar, MD  Insulin Pen Needle (PEN NEEDLES) 31G X 6 MM MISC Use as directed 05/10/18   Marcine Matar, MD  Insulin Syringes, Disposable, U-100 0.5 ML MISC Use as directed 06/11/17   Marcine Matar, MD  levothyroxine (SYNTHROID, LEVOTHROID) 200 MCG tablet Take 1 tablet (200 mcg total) by mouth daily. Dose increase. 04/06/18   Marcine Matar, MD  metFORMIN (GLUCOPHAGE) 1000 MG tablet Take 1,000 mg by mouth 2 (two) times daily with a  meal.    [provider]  metFORMIN (GLUCOPHAGE-XR) 500 MG 24 hr tablet TAKE 2 TABLETS BY MOUTH 2 TIMES DAILY WITH A MEAL. 03/29/18   Hoy Register, MD  metoprolol tartrate (LOPRESSOR) 50 MG tablet Take 1 tablet (50 mg total) by mouth 2 (two) times daily. 12/06/17   Marcine Matar, MD  nitroGLYCERIN (NITROSTAT) 0.4 MG SL tablet Place 1 tablet (0.4 mg total) under the tongue every 5 (five) minutes as needed for chest pain. Patient not taking: Reported on 03/21/2018 09/17/15   Rai, Delene Ruffini, MD  pantoprazole (PROTONIX) 40 MG tablet Take 1 tablet (40 mg total) by mouth daily. 02/01/18   [name] T, PA-C  rosuvastatin (CRESTOR) 40 MG tablet Take 1 tablet (40 mg total) by mouth daily. 12/07/17   Pricilla Riffle, MD  sildenafil (VIAGRA) 50 MG  tablet TAKE 1 TABLET (50 MG TOTAL) BY MOUTH DAILY AS NEEDED FOR ERECTILE DYSFUNCTION. 11/08/17   [name], [name], MD  TRUEPLUS LANCETS 28G MISC Use as directed 12/01/17   [name], [name], MD    Family History Family History  Adopted: Yes  Problem Relation Age of Onset  . Diabetes Maternal Grandmother     Social History Social History   Tobacco Use  . Smoking status: Former Smoker    Types: Cigarettes    Last attempt to quit: 01/31/2017    Years since quitting: 1.4  . Smokeless tobacco: Never Used  . Tobacco comment: quit 01/2017  Substance Use Topics  . Alcohol use: No    Alcohol/week: 0.0 standard drinks  . Drug use: No     Allergies   Levofloxacin; Nsaids; and Other   Review of Systems Review of Systems  Constitutional: Positive for unexpected weight change. Negative for chills and fever.  HENT: Negative for congestion and rhinorrhea.   Respiratory: Positive for shortness of breath. Negative for chest tightness.   Cardiovascular: Positive for leg swelling. Negative for chest pain.  Gastrointestinal: Positive for diarrhea. Negative for constipation, nausea and vomiting.  Genitourinary: Negative for frequency.    Musculoskeletal:       BL foot pain  Neurological: Negative for dizziness.  All other systems reviewed and are negative.    Physical Exam Updated Vital Signs BP 138/84 (BP Location: Right Arm)   Pulse 86   Temp 98.4 F (36.9 C) (Oral)   Resp 18   Ht 5\' 5"  (1.651 m)   Wt (!) 145.2 kg   SpO2 100%   BMI 53.25 kg/m   Physical Exam Vitals signs and nursing note reviewed.  Constitutional:      Appearance: Normal appearance. He is obese.  HENT:     Head: Normocephalic and atraumatic.     Nose: Nose normal.  Eyes:     Conjunctiva/sclera: Conjunctivae normal.     Pupils: Pupils are equal, round, and reactive to light.  Cardiovascular:     Rate and Rhythm: Normal rate and regular rhythm.     Pulses: Normal pulses.     Heart sounds: Normal heart sounds.  Pulmonary:     Effort: Pulmonary effort is normal.     Comments: Distant breath sounds due to body habitus, no crackles appreciated on exam Abdominal:     Palpations: Abdomen is soft.  Musculoskeletal:     Right lower leg: 3+ Edema present.     Left lower leg: 3+ Edema present.  Skin:    General: Skin is warm.  Neurological:     General: No focal deficit present.     Mental Status: He is alert.  Psychiatric:        Mood and Affect: Mood normal.        Behavior: Behavior normal.        Thought Content: Thought content normal.        Judgment: Judgment normal.     ED Treatments / Results  Labs (all labs ordered are listed, but only abnormal results are displayed) Labs Reviewed  BRAIN NATRIURETIC PEPTIDE - Abnormal; Notable for the following components:      Result Value   B Natriuretic Peptide 264.5 (*)    All other components within normal limits  CBC WITH DIFFERENTIAL/PLATELET - Abnormal; Notable for the following components:   Eosinophils Absolute 0.7 (*)    All other components within normal limits  BASIC METABOLIC PANEL  TROPONIN I  EKG EKG Interpretation  Date/Time:  Saturday July 09 2018  17:17:23 EST Ventricular Rate:  80 PR Interval:    QRS Duration: 98 QT Interval:  414 QTC Calculation: 478 R Axis:   76 Text Interpretation:  Sinus rhythm Low voltage, precordial leads Borderline prolonged QT interval similar to previous Confirmed by Frederick Peers [phone]) on 07/09/2018 5:28:58 PM   Radiology Dg Chest 2 View  Result Date: 07/09/2018 CLINICAL DATA:  Bilateral foot and leg pain for 4 days. History of CHF. EXAM: CHEST - 2 VIEW COMPARISON:  05/13/2017 FINDINGS: Cardiac silhouette is normal in size and configuration. Normal mediastinal and hilar contours. Clear lungs.  No pleural effusion or pneumothorax. Skeletal structures are intact. IMPRESSION: No active cardiopulmonary disease. Electronically Signed   By: Amie Portland M.D.   On: 07/09/2018 17:51    Procedures Procedures (including critical care time)  Medications Ordered in ED Medications  furosemide (LASIX) injection 80 mg (80 mg Intravenous Given 07/09/18 1611)    Initial Impression / Assessment and Plan / ED Course  I have reviewed the triage vital signs and the nursing notes.  Pertinent labs & imaging results that were available during my care of the patient were reviewed by me and considered in my medical decision making (see chart for details).   48 year old male here with foot pain with increased lower extremity edema and dyspnea on exertion with hx of CHF.  Treated for CAP by primary care physician on 07/05/2018.  Patient has had what appears to be 24 pound weight gain in the past month.  Appears fluid overloaded.  Will obtain BMP, BNP, CBC and troponin.  We will also obtain chest x-ray to determine if patient has fluid on his lungs.  Will give IV Lasix 80 mg x 1.  CXR with no active cardiopulmonary disease and no signs of pna.  BNP elevated to 264 today.  Otherwise labs normal and kidney function normal.  Patient diuresing well after IV Lasix.  Had long discussion with patient regarding fluid restriction as  well as limiting salt intake.  Patient also instructed to get compression stockings in order to help with his lower extremity swelling.    Patient to follow-up with his regular doctor within the next week in order to have recheck of his fluid status. Patient and fianc voiced understanding.  Given strict return precautions.  Marc Keyden Pavlov, DO PGY-2, [facility]   Final Clinical Impressions(s) / ED Diagnoses   Final diagnoses:  Acute on chronic congestive heart failure, unspecified heart failure type [facility])    ED Discharge Orders    None       Delanee Xin, [place], DO 07/09/18 1829    Little, Ambrose [place], MD 07/09/18 1948

## 2018-07-09 NOTE — ED Notes (Signed)
Pt has history of neuropathy

## 2018-07-09 NOTE — ED Notes (Signed)
ED Provider at bedside. 

## 2018-07-09 NOTE — ED Triage Notes (Addendum)
Pt c/o bilateral foot and leg pain that started Tuesday. Pt states he has history of CHF, bilateral swelling with pitting edema noted. Pt c/o 2nd toe on right foot "feels like somebody ripping it off"

## 2018-07-09 NOTE — Discharge Instructions (Addendum)
You are having an exacerbation of your heart failure.  It is very important that you start taking your Lasix 80 mg twice daily.  You need to be sure to follow-up with your regular doctor next week.  Please try to limit your sodium/salt intake.  He will also need to try to limit the amount of fluids that you are drinking daily.  It may be beneficial to purchase a pair of compression stockings in order to help with your lower extremity swelling.  If you develop any worsening shortness of breath, chest pain, or worsening leg swelling, then please do not hesitate to return to the emergency room.

## 2018-07-14 ENCOUNTER — Encounter: Payer: Self-pay | Admitting: Internal Medicine

## 2018-07-14 NOTE — Telephone Encounter (Signed)
Patient mychart concern.

## 2018-07-20 MED FILL — ?FENOFIBRATE 145 MG TABLET: 145 | 30 days supply | Qty: 30 | Fill #0

## 2018-07-26 ENCOUNTER — Other Ambulatory Visit: Payer: Self-pay | Admitting: Internal Medicine

## 2018-07-26 ENCOUNTER — Other Ambulatory Visit: Payer: Self-pay | Admitting: Cardiology

## 2018-07-26 DIAGNOSIS — N529 Male erectile dysfunction, unspecified: Secondary | ICD-10-CM

## 2018-07-26 MED FILL — $Viagra 50mg tablet: 50 | 60 days supply | Qty: 20 | Fill #0

## 2018-07-26 MED FILL — $HUMALOG 100 UNITS/ML KWIKP: 100 | 92 days supply | Qty: 105 | Fill #2

## 2018-07-26 MED FILL — TRUEplus LANCETS 28G MISC: 25 days supply | Qty: 100 | Fill #5

## 2018-07-26 MED FILL — !LANTUS SOLOSTAR 100UNITS/M: 100 | 23 days supply | Qty: 30 | Fill #2

## 2018-07-26 MED FILL — ROSUVASTATIN CALCIUM 40 MG: 40 | 90 days supply | Qty: 90 | Fill #2

## 2018-07-26 MED FILL — METFORMIN HCL ER 500 MG TAB: 500 | 30 days supply | Qty: 120 | Fill #3

## 2018-07-26 MED FILL — LEVOTHYROXINE 200 MCG TAB: 200 | 30 days supply | Qty: 30 | Fill #2

## 2018-07-26 MED FILL — TRUEPLUS PEN NDL 31GX3/16": 31G X 5 MM | 25 days supply | Qty: 100 | Fill #1

## 2018-07-26 MED FILL — METOPROLOL TARTRATE 50 MG T: 50 | 30 days supply | Qty: 60 | Fill #6

## 2018-07-26 MED FILL — ?PANTOPRAZOLE SO DR 40MG TA: 40 | 30 days supply | Qty: 30 | Fill #5

## 2018-07-26 MED FILL — TRUEPLUS PEN NDL 31GX3/16: 31G X 5 MM | 25 days supply | Qty: 100 | Fill #1

## 2018-07-26 MED FILL — CLOPIDOGREL 75 MG TABLET: 75 | 30 days supply | Qty: 30 | Fill #7

## 2018-07-26 MED FILL — TRUE METRIX TEST STRIP: 25 days supply | Qty: 100 | Fill #2

## 2018-07-26 MED FILL — ?EZETIMIBE 10MG TABLET: 10MG | 30 days supply | Qty: 30 | Fill #2

## 2018-07-26 MED FILL — JARDIANCE 10 MG TABLET: 10 | 30 days supply | Qty: 30 | Fill #0

## 2018-07-27 MED FILL — FUROSEMIDE 80 MG TAB: 80 | 30 days supply | Qty: 60 | Fill #0

## 2018-08-11 ENCOUNTER — Ambulatory Visit: Payer: Self-pay | Attending: Internal Medicine | Admitting: Internal Medicine

## 2018-08-11 ENCOUNTER — Encounter: Payer: Self-pay | Admitting: Internal Medicine

## 2018-08-11 VITALS — BP 132/80 | HR 69 | Temp 98.7°F | Resp 16 | Ht 65.5 in | Wt 303.0 lb

## 2018-08-11 DIAGNOSIS — E1165 Type 2 diabetes mellitus with hyperglycemia: Secondary | ICD-10-CM

## 2018-08-11 DIAGNOSIS — I2510 Atherosclerotic heart disease of native coronary artery without angina pectoris: Secondary | ICD-10-CM

## 2018-08-11 DIAGNOSIS — E039 Hypothyroidism, unspecified: Secondary | ICD-10-CM

## 2018-08-11 DIAGNOSIS — Z87891 Personal history of nicotine dependence: Secondary | ICD-10-CM | POA: Insufficient documentation

## 2018-08-11 DIAGNOSIS — Z7982 Long term (current) use of aspirin: Secondary | ICD-10-CM | POA: Insufficient documentation

## 2018-08-11 DIAGNOSIS — K219 Gastro-esophageal reflux disease without esophagitis: Secondary | ICD-10-CM | POA: Insufficient documentation

## 2018-08-11 DIAGNOSIS — G4733 Obstructive sleep apnea (adult) (pediatric): Secondary | ICD-10-CM

## 2018-08-11 DIAGNOSIS — N529 Male erectile dysfunction, unspecified: Secondary | ICD-10-CM | POA: Insufficient documentation

## 2018-08-11 DIAGNOSIS — Z79890 Hormone replacement therapy (postmenopausal): Secondary | ICD-10-CM | POA: Insufficient documentation

## 2018-08-11 DIAGNOSIS — Z79899 Other long term (current) drug therapy: Secondary | ICD-10-CM | POA: Insufficient documentation

## 2018-08-11 DIAGNOSIS — E1142 Type 2 diabetes mellitus with diabetic polyneuropathy: Secondary | ICD-10-CM

## 2018-08-11 DIAGNOSIS — Z833 Family history of diabetes mellitus: Secondary | ICD-10-CM | POA: Insufficient documentation

## 2018-08-11 DIAGNOSIS — E785 Hyperlipidemia, unspecified: Secondary | ICD-10-CM | POA: Insufficient documentation

## 2018-08-11 DIAGNOSIS — Z886 Allergy status to analgesic agent status: Secondary | ICD-10-CM | POA: Insufficient documentation

## 2018-08-11 DIAGNOSIS — Z794 Long term (current) use of insulin: Secondary | ICD-10-CM | POA: Insufficient documentation

## 2018-08-11 DIAGNOSIS — I110 Hypertensive heart disease with heart failure: Secondary | ICD-10-CM | POA: Insufficient documentation

## 2018-08-11 DIAGNOSIS — Z6841 Body Mass Index (BMI) 40.0 and over, adult: Secondary | ICD-10-CM | POA: Insufficient documentation

## 2018-08-11 DIAGNOSIS — Z7902 Long term (current) use of antithrombotics/antiplatelets: Secondary | ICD-10-CM | POA: Insufficient documentation

## 2018-08-11 DIAGNOSIS — Z955 Presence of coronary angioplasty implant and graft: Secondary | ICD-10-CM | POA: Insufficient documentation

## 2018-08-11 DIAGNOSIS — I5032 Chronic diastolic (congestive) heart failure: Secondary | ICD-10-CM

## 2018-08-11 DIAGNOSIS — I10 Essential (primary) hypertension: Secondary | ICD-10-CM

## 2018-08-11 DIAGNOSIS — IMO0002 Reserved for concepts with insufficient information to code with codable children: Secondary | ICD-10-CM

## 2018-08-11 DIAGNOSIS — Z881 Allergy status to other antibiotic agents status: Secondary | ICD-10-CM | POA: Insufficient documentation

## 2018-08-11 DIAGNOSIS — Z9989 Dependence on other enabling machines and devices: Secondary | ICD-10-CM

## 2018-08-11 LAB — POCT GLYCOSYLATED HEMOGLOBIN (HGB A1C): HbA1c, POC (controlled diabetic range): 8.9 % — AB (ref 0.0–7.0)

## 2018-08-11 LAB — GLUCOSE, POCT (MANUAL RESULT ENTRY): POC Glucose: 244 mg/dl — AB (ref 70–99)

## 2018-08-11 MED ORDER — INSULIN GLARGINE 100 UNIT/ML SOLOSTAR PEN: 68.0000 [IU] | PEN_INJECTOR | Freq: Two times a day (BID) | SUBCUTANEOUS | 11 refills | Status: DC

## 2018-08-11 MED ORDER — METFORMIN HCL ER 500 MG PO TB24: 500.0000 mg | ORAL_TABLET | Freq: Two times a day (BID) | ORAL | 11 refills | Status: DC

## 2018-08-11 NOTE — Patient Instructions (Signed)
Decrease metformin to 500 mg twice a day.  Let me know if you continue to have diarrhea. Increase Lantus to 68 units twice a day. I have referred you to a nutritionist for counseling and teaching. We will refer you to [facility] to see if we could get a new facemask for you.

## 2018-08-11 NOTE — Progress Notes (Signed)
Patient ID: Marc MortimerJames Bobst, male    DOB: 1970/11/18  MRN: [id number]  CC: Diabetes and Hypertension   Subjective: Marc Walker is a 48 y.o. male who presents for chronic ds management. His concerns today include:  48 year old male with history of CAD (8stents), DM type II, HL, HTN, obesity, OSA on CPAP,formertobacco dependence, BPH, hypothyroid.  Hypothyroidism:  Last TSH was nl.  Reports compliance with levothyroxine.  No palpitations.  Diastolic CHF/CAD/HTN:  Reports taking Lasix more consistently since last visit with me.  Occasionally skips the evening dose.  Compliant with other medications.  Limits salt in foods.   Loss 17 lbs in last 1 mth.   -no LE edema No PND/orthopnea.  No CP  OSA on CPAP:  Has appt with sleep specialist later this mth Compliant with CPAP but having to pull strap for mask tighter to prevent leakage.  Daytime sleepiness less.  DIABETES TYPE 2 Last A1C:   Results for orders placed or performed in visit on 08/11/18  POCT glucose (manual entry)  Result Value Ref Range   POC Glucose 244 (A) 70 - 99 mg/dl  POCT glycosylated hemoglobin (Hb A1C)  Result Value Ref Range   Hemoglobin A1C     HbA1c POC (<> result, manual entry)     HbA1c, POC (prediabetic range)     HbA1c, POC (controlled diabetic range) 8.9 (A) 0.0 - 7.0 %    Med Adherence:  [x]  Yes - Metformin, Jardiance, Lantus and Humalog    []  No Medication side effects:  [x]  Yes, diarrhea with Metformin x 3-4 mths.  Having to take Pepto Bismol  daily Home Monitoring?  [x]  Yes, 3-5 x a day.  Did not bring log    []  No Home glucose results range: a.m range 140-150s, before dinner range 150s Diet Adherence: [x]  Yes, "I'm trying."    []  No Exercise: [x]  Yes, walking more by parking further away from buildings   []  No Hypoglycemic episodes?: []  Yes    [x]  No Numbness of the feet? []  Yes    [x]  No Retinopathy hx? []  Yes    [x]  No Last eye exam: Overdue for eye exam but cannot afford as he is  uninsured. Comments:   Patient Active Problem List   Diagnosis Date Noted  . CAD S/P multiple PCI's 08/09/2017  . Prolonged QT interval 05/14/2017  . Wide-complex tachycardia (HCC) 05/14/2017  . GERD (gastroesophageal reflux disease) 05/13/2017  . Hypothyroidism   . Bilateral carpal tunnel syndrome 04/08/2017  . Chronic diastolic CHF (congestive heart failure) (HCC) 03/09/2017  . Erectile dysfunction 03/02/2017  . OSA on CPAP 07/06/2016  . Morbid obesity (HCC) 07/06/2016  . DM type 2 with diabetic peripheral neuropathy (HCC) 12/16/2015  . CAD (coronary artery disease) 09/30/2015  . Essential hypertension 09/16/2015  . Hyperlipidemia      Current Outpatient Medications on File Prior to Visit  Medication Sig Dispense Refill  . aspirin 81 MG tablet Take 1 tablet (81 mg total) by mouth daily. 90 tablet 3  . azithromycin (ZITHROMAX) 250 MG tablet 2 tabs PO x 1 then 1 tab PO daily (Patient not taking: Reported on 08/11/2018) 6 tablet 0  . benzonatate (TESSALON) 100 MG capsule Take 1 capsule (100 mg total) by mouth 2 (two) times daily as needed for cough. (Patient not taking: Reported on 08/11/2018) 30 capsule 0  . clopidogrel (PLAVIX) 75 MG tablet Take 1 tablet (75 mg total) by mouth daily. 90 tablet 3  . empagliflozin (JARDIANCE) 10  MG TABS tablet Take 10 mg by mouth daily. 90 tablet 3  . ezetimibe (ZETIA) 10 MG tablet Take 1 tablet (10 mg total) by mouth daily. 90 tablet 3  . famotidine (PEPCID) 40 MG tablet Take 1 tablet (40 mg total) by mouth at bedtime.    . fenofibrate (TRICOR) 145 MG tablet Take 1 tablet (145 mg total) by mouth daily. 30 tablet 11  . furosemide (LASIX) 80 MG tablet TAKE 1 TABLET 2 TIMES DAILY BY MOUTH. 180 tablet 0  . glucose blood test strip Use as instructed 100 each 12  . Insulin Glargine (LANTUS SOLOSTAR) 100 UNIT/ML Solostar Pen Inject 64 Units into the skin 2 (two) times daily. 5 pen 11  . insulin lispro (HUMALOG KWIKPEN) 100 UNIT/ML KwikPen Inject 0.38 mLs (38  Units total) into the skin 3 (three) times daily. Take with meals 15 mL 11  . Insulin Pen Needle (PEN NEEDLES) 31G X 6 MM MISC Use as directed 100 each 6  . Insulin Syringes, Disposable, U-100 0.5 ML MISC Use as directed 100 each 11  . levothyroxine (SYNTHROID, LEVOTHROID) 200 MCG tablet Take 1 tablet (200 mcg total) by mouth daily. Dose increase. 30 tablet 3  . metFORMIN (GLUCOPHAGE) 1000 MG tablet Take 1,000 mg by mouth 2 (two) times daily with a meal.    . metFORMIN (GLUCOPHAGE-XR) 500 MG 24 hr tablet TAKE 2 TABLETS BY MOUTH 2 TIMES DAILY WITH A MEAL. 120 tablet 11  . metoprolol tartrate (LOPRESSOR) 50 MG tablet Take 1 tablet (50 mg total) by mouth 2 (two) times daily. 180 tablet 3  . nitroGLYCERIN (NITROSTAT) 0.4 MG SL tablet Place 1 tablet (0.4 mg total) under the tongue every 5 (five) minutes as needed for chest pain. (Patient not taking: Reported on 03/21/2018) 30 tablet 12  . pantoprazole (PROTONIX) 40 MG tablet Take 1 tablet (40 mg total) by mouth daily. 90 tablet 3  . rosuvastatin (CRESTOR) 40 MG tablet Take 1 tablet (40 mg total) by mouth daily. 90 tablet 3  . sildenafil (VIAGRA) 50 MG tablet TAKE 1 TABLET (50 MG TOTAL) BY MOUTH DAILY AS NEEDED FOR ERECTILE DYSFUNCTION. 10 tablet 2  . TRUEPLUS LANCETS 28G MISC Use as directed 100 each 12   No current facility-administered medications on file prior to visit.     Allergies  Allergen Reactions  . Levofloxacin Rash    Rash on back (not sun exposed)and hypersensitivity to skin on exposed skin/arm Rash on back (not sun exposed)and hypersensitivity to skin on exposed skin/arm  . Nsaids     Avoid due to CKD  . 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  Pt only wants pain medicine if absolutely necessary.    Social History   Socioeconomic History  . Marital status: Divorced    Spouse name: Not on file  . Number of children: Not on file  . Years of education: Not on file  . Highest education level: Not on file    Occupational History  . 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 Financial resource strain: Not on file  . Food insecurity:    Worry: Not on file    Inability: Not on file  . Transportation needs:    Medical: Not on file    Non-medical: Not on file  Tobacco Use  . Smoking status: Former Smoker    Types: Cigarettes    Last attempt to quit: 01/31/2017    Years since  quitting: 1.5  . Smokeless tobacco: Never Used  . Tobacco comment: quit 01/2017  Substance and Sexual Activity  . Alcohol use: No    Alcohol/week: 0.0 standard drinks  . Drug use: No  . Sexual activity: Not on file  Lifestyle  . Physical activity:    Days per week: Not on file    Minutes per session: Not on file  . Stress: Not on file  Relationships  . Social connections:    Talks on phone: Not on file    Gets together: Not on file    Attends religious service: Not on file    Active member of club or organization: Not on file    Attends meetings of clubs or organizations: Not on file    Relationship status: Not on file  . Intimate partner violence:    Fear of current or ex partner: Not on file    Emotionally abused: Not on file    Physically abused: Not on file    Forced sexual activity: Not on file  Other Topics Concern  . Not on file  Social History Narrative   Adopted. Very little info on biological parents, ?diabetes.   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 Diabetes Maternal Grandmother     Past Surgical History:  Procedure Laterality Date  . APPENDECTOMY    . CARDIAC CATHETERIZATION N/A 09/16/2015   Procedure: Left Heart Cath and Coronary Angiography;  Surgeon: Runell Gess, MD;  Location: [facility] INVASIVE CV LAB;  Service: Cardiovascular;  Laterality: N/A;  . CARDIAC CATHETERIZATION N/A 09/16/2015   Procedure: Coronary Stent Intervention;  Surgeon: Runell Gess, MD;  Location: [facility] INVASIVE CV LAB;  Service: Cardiovascular;   Laterality: N/A;  . CORONARY STENT PLACEMENT      ROS: Review of Systems Negative except as stated above  PHYSICAL EXAM: BP 132/80   Pulse 69   Temp 98.7 F (37.1 C) (Oral)   Resp 16   Ht 5' 5.5" (1.664 m)   Wt (!) 303 lb (137.4 kg)   SpO2 97%   BMI 49.65 kg/m   Wt Readings from Last 3 Encounters:  08/11/18 (!) 303 lb (137.4 kg)  07/09/18 (!) 320 lb (145.2 kg)  07/05/18 (!) 308 lb 3.2 oz (139.8 kg)    Physical Exam  General appearance - alert, well appearing, and in no distress Mental status - normal mood, behavior, speech, dress, motor activity, and thought processes Mouth - mucous membranes moist, pharynx normal without lesions Neck - supple, no significant adenopathy Chest - clear to auscultation, no wheezes, rales or rhonchi, symmetric air entry Heart - normal rate, regular rhythm, normal S1, S2, no murmurs, rubs, clicks or gallops Extremities -trace lower extremity edema  CMP Latest Ref Rng & Units 07/09/2018 07/05/2018 05/23/2018  Glucose 70 - 99 mg/dL 95 01(S) -  BUN 6 - 20 mg/dL 14 13 -  Creatinine 0.10 - 1.24 mg/dL 9.32 3.55(D) -  Sodium 135 - 145 mmol/L 137 144 -  Potassium 3.5 - 5.1 mmol/L 3.7 3.5 -  Chloride 98 - 111 mmol/L 103 106 -  CO2 22 - 32 mmol/L 24 22 -  Calcium 8.9 - 10.3 mg/dL 9.2 9.5 -  Total Protein 6.0 - 8.5 g/dL - - 5.7(L)  Total Bilirubin 0.0 - 1.2 mg/dL - - 0.2  Alkaline Phos 39 - 117 IU/L - - 62  AST 0 - 40 IU/L - -  13  ALT 0 - 44 IU/L - - 17   Lipid Panel     Component Value Date/Time   CHOL 96 (L) 05/23/2018 0952   TRIG 132 05/23/2018 0952   HDL 42 05/23/2018 0952   CHOLHDL 2.3 05/23/2018 0952   CHOLHDL 2.9 07/10/2016 0416   VLDL 18 07/10/2016 0416   LDLCALC 28 05/23/2018 0952    CBC    Component Value Date/Time   WBC 10.0 07/09/2018 1609   RBC 4.81 07/09/2018 1609   HGB 13.1 07/09/2018 1609   HGB 13.8 07/05/2018 1618   HCT 42.7 07/09/2018 1609   HCT 43.1 07/05/2018 1618   PLT 271 07/09/2018 1609   PLT 321 07/05/2018  1618   MCV 88.8 07/09/2018 1609   MCV 86 07/05/2018 1618   MCH 27.2 07/09/2018 1609   MCHC 30.7 07/09/2018 1609   RDW 14.8 07/09/2018 1609   RDW 14.5 07/05/2018 1618   LYMPHSABS 2.4 07/09/2018 1609   LYMPHSABS 5.7 (H) 07/05/2018 1618   MONOABS 0.9 07/09/2018 1609   EOSABS 0.7 (H) 07/09/2018 1609   EOSABS 0.9 (H) 07/05/2018 1618   BASOSABS 0.1 07/09/2018 1609   BASOSABS 0.1 07/05/2018 1618   Results for orders placed or performed in visit on 08/11/18  POCT glucose (manual entry)  Result Value Ref Range   POC Glucose 244 (A) 70 - 99 mg/dl  POCT glycosylated hemoglobin (Hb A1C)  Result Value Ref Range   Hemoglobin A1C     HbA1c POC (<> result, manual entry)     HbA1c, POC (prediabetic range)     HbA1c, POC (controlled diabetic range) 8.9 (A) 0.0 - 7.0 %    ASSESSMENT AND PLAN:  1. Uncontrolled type 2 diabetes mellitus with peripheral neuropathy (HCC) A1c has improved several points since November. Given the diarrhea that he is having with metformin, I recommend decreasing the dose from 1 g twice a day to 500 mg twice a day.  Increase Lantus from 64 units twice a day to 68 units twice a day. Stressed the importance of good eating habits.  Referral to nutritionist. Commended him on moving more. - POCT glucose (manual entry) - POCT glycosylated hemoglobin (Hb A1C) - Microalbumin / creatinine urine ratio - Amb ref to Medical Nutrition Therapy-MNT - metFORMIN (GLUCOPHAGE-XR) 500 MG 24 hr tablet; Take 1 tablet (500 mg total) by mouth 2 (two) times daily.  Dispense: 60 tablet; Refill: 11 - Insulin Glargine (LANTUS SOLOSTAR) 100 UNIT/ML Solostar Pen; Inject 68 Units into the skin 2 (two) times daily.  Dispense: 5 pen; Refill: 11  2. Chronic diastolic CHF (congestive heart failure) (HCC) Compensated.  Weight down 17 points in the past month.  Encourage compliance with medications including Lasix - Basic Metabolic Panel  3. Coronary artery disease involving native coronary artery of  native heart without angina pectoris Continue aspirin, Plavix, metoprolol and Crestor  4. Essential hypertension Close to goal.  Continue low-salt diet and current medications.  5. OSA on CPAP Continue CPAP.  We will try to send [facility] to [facility] long sleep center to see if they would provider free mask  6. Acquired hypothyroidism Finally controlled.  Continue levothyroxine  7. Morbid obesity (HCC) - Amb ref to Medical Nutrition Therapy-MNT     Patient was given the opportunity to ask questions.  Patient verbalized understanding of the plan and was able to repeat key elements of the plan.   Orders Placed This Encounter  Procedures  . Microalbumin / creatinine urine ratio  .  POCT glucose (manual entry)  . POCT glycosylated hemoglobin (Hb A1C)     Requested Prescriptions    No prescriptions requested or ordered in this encounter    No follow-ups on file.  Jonah Blue, MD, FACP

## 2018-08-12 LAB — BASIC METABOLIC PANEL
BUN/Creatinine Ratio: 23 — ABNORMAL HIGH (ref 9–20)
BUN: 25 mg/dL — ABNORMAL HIGH (ref 6–24)
CO2: 25 mmol/L (ref 20–29)
Calcium: 9.8 mg/dL (ref 8.7–10.2)
Chloride: 98 mmol/L (ref 96–106)
Creatinine, Ser: 1.1 mg/dL (ref 0.76–1.27)
GFR calc Af Amer: 91 mL/min/{1.73_m2} (ref >59)
GFR calc non Af Amer: 78 mL/min/{1.73_m2} (ref >59)
Glucose: 231 mg/dL — ABNORMAL HIGH (ref 65–99)
Potassium: 4.3 mmol/L (ref 3.5–5.2)
Sodium: 140 mmol/L (ref 134–144)

## 2018-08-12 LAB — MICROALBUMIN / CREATININE URINE RATIO
Creatinine, Urine: 10.4 mg/dL
Microalb/Creat Ratio: 38 mg/g creat — ABNORMAL HIGH (ref 0–29)
Microalbumin, Urine: 4 ug/mL

## 2018-08-22 ENCOUNTER — Ambulatory Visit (INDEPENDENT_AMBULATORY_CARE_PROVIDER_SITE_OTHER): Payer: Self-pay | Admitting: Internal Medicine

## 2018-08-22 ENCOUNTER — Ambulatory Visit (INDEPENDENT_AMBULATORY_CARE_PROVIDER_SITE_OTHER): Payer: Self-pay | Admitting: Physician Assistant

## 2018-08-22 ENCOUNTER — Encounter: Payer: Self-pay | Admitting: Physician Assistant

## 2018-08-22 ENCOUNTER — Encounter: Payer: Self-pay | Admitting: Internal Medicine

## 2018-08-22 VITALS — BP 128/81 | HR 97 | Ht 65.5 in | Wt 301.0 lb

## 2018-08-22 VITALS — BP 112/70 | HR 78 | Ht 65.5 in | Wt 304.0 lb

## 2018-08-22 DIAGNOSIS — G4733 Obstructive sleep apnea (adult) (pediatric): Secondary | ICD-10-CM

## 2018-08-22 DIAGNOSIS — I2510 Atherosclerotic heart disease of native coronary artery without angina pectoris: Secondary | ICD-10-CM

## 2018-08-22 DIAGNOSIS — Z9989 Dependence on other enabling machines and devices: Secondary | ICD-10-CM

## 2018-08-22 DIAGNOSIS — I5032 Chronic diastolic (congestive) heart failure: Secondary | ICD-10-CM

## 2018-08-22 MED FILL — TRUEPLUS PEN NDL 31GX3/16: 31G X 5 MM | 25 days supply | Qty: 100 | Fill #2

## 2018-08-22 MED FILL — METFORMIN HCL ER 500 MG TAB: 500 | 30 days supply | Qty: 120 | Fill #4

## 2018-08-22 MED FILL — ?PANTOPRAZOLE SOD DR 40MG T: 40 | 30 days supply | Qty: 30 | Fill #6

## 2018-08-22 MED FILL — FENOFIBRATE 145 MG TABLET: 145 | 30 days supply | Qty: 30 | Fill #1

## 2018-08-22 MED FILL — METOPROLOL TARTRATE 50 MG T: 50 | 30 days supply | Qty: 60 | Fill #7

## 2018-08-22 MED FILL — TRUE METRIX TEST STRIP: 25 days supply | Qty: 100 | Fill #3

## 2018-08-22 MED FILL — CLOPIDOGREL 75 MG TABLET: 75 | 30 days supply | Qty: 30 | Fill #8

## 2018-08-22 MED FILL — ?EZETIMIBE 10MG TABLET: 10MG | 30 days supply | Qty: 30 | Fill #3

## 2018-08-22 MED FILL — JARDIANCE 10 MG TABLET: 10 | 30 days supply | Qty: 30 | Fill #1

## 2018-08-22 MED FILL — TRUEPLUS PEN NDL 31GX3/16": 31G X 5 MM | 25 days supply | Qty: 100 | Fill #2

## 2018-08-22 MED FILL — TRUEplus LANCETS 28G MISC: 25 days supply | Qty: 100 | Fill #6

## 2018-08-22 MED FILL — LEVOTHYROXINE 200 MCG TAB: 200 | 30 days supply | Qty: 30 | Fill #3

## 2018-08-22 NOTE — Assessment & Plan Note (Signed)
Hew is not in obvious right-sided failure at this visit. Followed by cardiology.

## 2018-08-22 NOTE — Patient Instructions (Signed)
We will continue with your current CPAP machine for now, which we think is set on 18 cwp.  Order- referral to Sleep Center for mask fitting and hopefully they can help with hose, filters and supplies for your machine.   Please call as needed

## 2018-08-22 NOTE — Progress Notes (Signed)
   Subjective:    Patient ID: Marc Walker, male    DOB: 05-May-1971, 48 y.o.   MRN: [id number]  HPI    Review of Systems  Constitutional: Negative for fever and unexpected weight change.  HENT: Negative for congestion, dental problem, ear pain, nosebleeds, postnasal drip, rhinorrhea, sinus pressure, sneezing, sore throat and trouble swallowing.   Eyes: Negative for redness and itching.  Respiratory: Positive for chest tightness. Negative for cough, shortness of breath and wheezing.   Cardiovascular: Positive for leg swelling. Negative for palpitations.  Gastrointestinal: Negative for nausea and vomiting.  Genitourinary: Negative for dysuria.  Musculoskeletal: Positive for joint swelling.  Skin: Negative for rash.  Allergic/Immunologic: Negative.  Negative for environmental allergies, food allergies and immunocompromised state.  Neurological: Negative for headaches.  Hematological: Does not bruise/bleed easily.  Psychiatric/Behavioral: Negative for dysphoric mood. The patient is not nervous/anxious.        Objective:   Physical Exam        Assessment & Plan:

## 2018-08-22 NOTE — Progress Notes (Signed)
Cardiology Office Note    Date:  08/22/2018   ID:  Marc Walker, DOB Nov 19, 1970, MRN [id number]  PCP:  Marc Matar, MD  Cardiologist:  Marc Pates, MD  Sleep Med:Dr. Armanda Walker  Chief Complaint: 6 Months follow up  History of Present Illness:   Marc Walker is a 48 y.o. male with CAD s/pprior myocardial infarction, s/p multipleprior stents placedat outside institutions, DM, HTN, HL, tobacco abuse (quite 01/2017), OSA, and chronic diastolic HF presents for follow up.   He was admitted to [facility] in 3/17 with unstable angina. LHC demonstrated 80% mid RCA stenosis treated with DES, 90% proximal LAD and 70% mid LAD stenosis involving the previous stent treated with DES, 20% RPDA in-stent restenosis treated with angioplasty. The stent in OM2 was patent. Residual disease included 90% D2 stenosis and 70% mid LCx stenosis, treated medically.   Low risk stress test 02/2016. Last echo 04/2017 showed normal LVEF, grade 1 DD.   He was doing well on cardiac stand point when last seen Tereso Walker 02/01/18.  Here today for follow up. He is eating whatever he can. Not much activity. Uses cane. Compliant with compression stocking. No chest pain, dyspnea, palpitations, orthopnea, PND, melena or dizziness.   Past Medical History:  Diagnosis Date  . Chronic diastolic CHF (congestive heart failure) (HCC) 03/09/2017   Echo 1/18: Mild LVH, EF 60-65, Gr 2 DD, mild LAE; difficult study-no obvious wall motion abnormalities with Definity  . CKD (chronic kidney disease)   . Coronary artery disease    a. s/pprior myocardial infarction, s/p multipleprior stents placedat outside institutions with last intervention 08/2015 at [facility].  . Diabetes mellitus (HCC)   . Drug addiction in remission (HCC)   . Erectile dysfunction 03/02/2017  . Heartburn   . Hyperlipidemia associated with type 2 diabetes mellitus (HCC)   . Hypertension   . Hypothyroidism    a. adm with profound hypothyroidism  near [facility] in 04/2017.  . MI (myocardial infarction) (HCC) 07/2009   [facility] in [place], [place] for last 2, 1st one in [place], [place]; total of 6 stents, last one 02/2014   . Morbid obesity (HCC) 07/06/2016  . Noncompliance with therapeutic plan    a. thyroid med -> profound hypothyroidism 04/2017.  Marland Kitchen OSA (obstructive sleep apnea) 07/06/2016   Moderate with AHI 21.8/hr by PSG 08/2012 now on CPAP  . Prolonged QT interval 05/14/2017  . Tobacco abuse   . Wide-complex tachycardia (HCC) 05/14/2017    Past Surgical History:  Procedure Laterality Date  . APPENDECTOMY    . CARDIAC CATHETERIZATION N/A 09/16/2015   Procedure: Left Heart Cath and Coronary Angiography;  Surgeon: Runell Gess, MD;  Location: [facility] INVASIVE CV LAB;  Service: Cardiovascular;  Laterality: N/A;  . CARDIAC CATHETERIZATION N/A 09/16/2015   Procedure: Coronary Stent Intervention;  Surgeon: Runell Gess, MD;  Location: [facility] INVASIVE CV LAB;  Service: Cardiovascular;  Laterality: N/A;  . CORONARY STENT PLACEMENT      Current Medications: Prior to Admission medications   Medication Sig Start Date End Date Taking? Authorizing Provider  aspirin 81 MG tablet Take 1 tablet (81 mg total) by mouth daily. 11/17/16   Pete Glatter, MD  clopidogrel (PLAVIX) 75 MG tablet Take 1 tablet (75 mg total) by mouth daily. 11/29/17   Pricilla Riffle, MD  empagliflozin (JARDIANCE) 10 MG TABS tablet Take 10 mg by mouth daily. 04/08/18   Marc Matar, MD  ezetimibe (ZETIA) 10 MG  tablet Take 1 tablet (10 mg total) by mouth daily. 04/08/18   Marc Matar, MD  famotidine (PEPCID) 40 MG tablet Take 1 tablet (40 mg total) by mouth at bedtime. 02/03/18   Tereso Walker T, PA-C  fenofibrate (TRICOR) 145 MG tablet Take 1 tablet (145 mg total) by mouth daily. 04/12/18   Tereso Walker T, PA-C  furosemide (LASIX) 80 MG tablet TAKE 1 TABLET 2 TIMES DAILY BY MOUTH. 07/27/18   Pricilla Riffle, MD  glucose blood test strip Use as instructed  05/09/18   Marc Matar, MD  Insulin Glargine (LANTUS SOLOSTAR) 100 UNIT/ML Solostar Pen Inject 68 Units into the skin 2 (two) times daily. 08/11/18   Marc Matar, MD  insulin lispro (HUMALOG KWIKPEN) 100 UNIT/ML KwikPen Inject 0.38 mLs (38 Units total) into the skin 3 (three) times daily. Take with meals 05/10/18   Marc Matar, MD  Insulin Pen Needle (PEN NEEDLES) 31G X 6 MM MISC Use as directed 05/10/18   Marc Matar, MD  Insulin Syringes, Disposable, U-100 0.5 ML MISC Use as directed 06/11/17   Marc Matar, MD  levothyroxine (SYNTHROID, LEVOTHROID) 200 MCG tablet Take 1 tablet (200 mcg total) by mouth daily. Dose increase. 04/06/18   Marc Matar, MD  metFORMIN (GLUCOPHAGE-XR) 500 MG 24 hr tablet Take 1 tablet (500 mg total) by mouth 2 (two) times daily. 08/11/18   Marc Matar, MD  metoprolol tartrate (LOPRESSOR) 50 MG tablet Take 1 tablet (50 mg total) by mouth 2 (two) times daily. 12/06/17   Marc Matar, MD  nitroGLYCERIN (NITROSTAT) 0.4 MG SL tablet Place 1 tablet (0.4 mg total) under the tongue every 5 (five) minutes as needed for chest pain. Patient not taking: Reported on 03/21/2018 09/17/15   Rai, Delene Ruffini, MD  pantoprazole (PROTONIX) 40 MG tablet Take 1 tablet (40 mg total) by mouth daily. 02/01/18   Tereso Walker T, PA-C  rosuvastatin (CRESTOR) 40 MG tablet Take 1 tablet (40 mg total) by mouth daily. 12/07/17   Pricilla Riffle, MD  sildenafil (VIAGRA) 50 MG tablet TAKE 1 TABLET (50 MG TOTAL) BY MOUTH DAILY AS NEEDED FOR ERECTILE DYSFUNCTION. 07/26/18   Marc Matar, MD  TRUEPLUS LANCETS 28G MISC Use as directed 12/01/17   Marc Matar, MD    Allergies:   Levofloxacin; Nsaids; and Other   Social History   Socioeconomic History  . Marital status: Divorced    Spouse name: Not on file  . Number of children: Not on file  . Years of education: Not on file  . Highest education level: Not on file  Occupational History  . Occupation:  Company secretary  Social Needs  . Financial resource strain: Not on file  . Food insecurity:    Worry: Not on file    Inability: Not on file  . Transportation needs:    Medical: Not on file    Non-medical: Not on file  Tobacco Use  . Smoking status: Former Smoker    Types: Cigarettes    Last attempt to quit: 01/31/2017    Years since quitting: 1.5  . Smokeless tobacco: Never Used  . Tobacco comment: quit 01/2017  Substance and Sexual Activity  . Alcohol use: No    Alcohol/week: 0.0 standard drinks  . Drug use: No  . Sexual activity: Not on file  Lifestyle  . Physical activity:    Days per week: Not on file    Minutes per session: Not  on file  . Stress: Not on file  Relationships  . Social connections:    Talks on phone: Not on file    Gets together: Not on file    Attends religious service: Not on file    Active member of club or organization: Not on file    Attends meetings of clubs or organizations: Not on file    Relationship status: Not on file  Other Topics Concern  . Not on file  Social History Narrative   Adopted. Very little info on biological parents, ?diabetes.   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 He was adopted.   ROS:   Please see the history of present illness.    ROS All other systems reviewed and are negative.   PHYSICAL EXAM:   VS:  BP 112/70   Pulse 78   Ht 5' 5.5" (1.664 m)   Wt (!) 304 lb (137.9 kg)   SpO2 96%   BMI 49.82 kg/m    GEN: Well nourished, well developed, in no acute distress  HEENT: normal  Neck: no JVD, carotid bruits, or masses Cardiac: RRR; no murmurs, rubs, or gallops,no edema  Respiratory:  clear to auscultation bilaterally, normal work of breathing GI: soft, nontender, nondistended, + BS MS: no deformity or atrophy  Skin: warm and dry, no rash Neuro:  Alert and Oriented x 3, Strength and sensation are intact Psych: euthymic  mood, full affect  Wt Readings from Last 3 Encounters:  08/22/18 (!) 304 lb (137.9 kg)  08/22/18 (!) 301 lb (136.5 kg)  08/11/18 (!) 303 lb (137.4 kg)      Studies/Labs Reviewed:   EKG:  EKG is not ordered today.    Recent Labs: 05/23/2018: ALT 17 07/05/2018: TSH 2.430 07/09/2018: B Natriuretic Peptide 264.5; Hemoglobin 13.1; Platelets 271 08/11/2018: BUN 25; Creatinine, Ser 1.10; Potassium 4.3; Sodium 140   Lipid Panel    Component Value Date/Time   CHOL 96 (L) 05/23/2018 0952   TRIG 132 05/23/2018 0952   HDL 42 05/23/2018 0952   CHOLHDL 2.3 05/23/2018 0952   CHOLHDL 2.9 07/10/2016 0416   VLDL 18 07/10/2016 0416   LDLCALC 28 05/23/2018 0952    Additional studies/ records that were reviewed today include:   As summarized above  ASSESSMENT & PLAN:    1. CAD - No angina. Continue ASA, Plavix, BB and statin.   2. Chronic diastolic CHF - Advised to cut back on salty food. Euvolemic. Continue lasix.   3. OSA on CPAP - Compliant  4. HTN - BP stable on current medications.   5. Morbid obesity - Encouraged weight loss and daily exercise. Discussed dietary restriction.   Medication Adjustments/Labs and Tests Ordered: Current medicines are reviewed at length with the patient today.  Concerns regarding medicines are outlined above.  Medication changes, Labs and Tests ordered today are listed in the Patient Instructions below. Patient Instructions  Medication Instructions:  Your physician recommends that you continue on your current medications as directed. Please refer to the Current Medication list given to you today.  If you need a refill on your cardiac medications before your next appointment, please call your pharmacy.   Lab work: None ordered  If you have labs (blood work) drawn today and your tests are completely normal, you will receive your results only by: Marland Kitchen MyChart Message (if you have MyChart) OR . A paper copy in  the mail If you have any lab test that  is abnormal or we need to change your treatment, we will call you to review the results.  Testing/Procedures: None ordered  Follow-Up: At [facility], you and your health needs are our priority.  As part of our continuing mission to provide you with exceptional heart care, we have created designated Provider Care Teams.  These Care Teams include your primary Cardiologist (physician) and Advanced Practice Providers (APPs -  Physician Assistants and Nurse Practitioners) who all work together to provide you with the care you need, when you need it. You will need a follow up appointment in:  6 months.  Please call our office 2 months in advance to schedule this appointment.  You may see Marc Pates, MD or one of the following Advanced Practice Providers on your designated Care Team: [name], PA-C Vin Boston, [place] . Berton Bon, NP  Any Other Special Instructions Will Be Listed Below (If Applicable).       Lorelei Pont, [place]  08/22/2018 3:11 PM    [facility] [address] [place], [place], [place]  [address] Phone: [phone]; Fax: [phone]

## 2018-08-22 NOTE — Patient Instructions (Signed)
Medication Instructions:  Your physician recommends that you continue on your current medications as directed. Please refer to the Current Medication list given to you today.  If you need a refill on your cardiac medications before your next appointment, please call your pharmacy.   Lab work: None ordered  If you have labs (blood work) drawn today and your tests are completely normal, you will receive your results only by: . MyChart Message (if you have MyChart) OR . A paper copy in the mail If you have any lab test that is abnormal or we need to change your treatment, we will call you to review the results.  Testing/Procedures: None ordered   Follow-Up: At [facility], you and your health needs are our priority.  As part of our continuing mission to provide you with exceptional heart care, we have created designated Provider Care Teams.  These Care Teams include your primary Cardiologist (physician) and Advanced Practice Providers (APPs -  Physician Assistants and Nurse Practitioners) who all work together to provide you with the care you need, when you need it. You will need a follow up appointment in:  6 months.  Please call our office 2 months in advance to schedule this appointment.  You may see Paula Ross, MD or one of the following Advanced Practice Providers on your designated Care Team: Scott Weaver, PA-C Vin Bhagat, PA-C . Janine Hammond, NP  Any Other Special Instructions Will Be Listed Below (If Applicable).    

## 2018-08-22 NOTE — Assessment & Plan Note (Signed)
A real effort at weight loss is important and supported

## 2018-08-22 NOTE — Assessment & Plan Note (Signed)
We are going to try to get him a new mask and supplies now, continuing with his current machine. Later we may be able to get assistance for a BIPAP machine, but we will need a download report for status on his current pressure.  Plan- refer for mask fitting. Hopefully there will be some supplies available for this machine.

## 2018-08-22 NOTE — Progress Notes (Signed)
08/22/2018-48 year old male former smoker for sleep evaluation. On cpap therapy now since 2012. Needs new mask.Does not have a DME company  NPSG 09/15/12- [facility]- AHI 21.8/ hr, desaturation to 62%, CPAP titrated to 13. A CPAP titration study was done at [facility] 05/04/2017 for Dr. Mayford Knifeurner.  BiPAP was required for adequate control, titrated to 29/20 with minimal saturation 90% at that pressure on room air. Body weight for that study 295 pounds Unemployed- self pay Epworth score 23 Complicating medical problems include chronic diastolic CHF, CAD status post multiple PCI's, GERD, hypothyroid, DM 2 with peripheral neuropathy, morbid obesity Weight today 301 pounds Current machine about 50 years old but working well. He thinks pressure is 16 0r 18 cwp. He couldn't afford the BIPAP machine offered after his last titration study. Admits some EDS. Has filed for disability and has an attorney for this.  Admits DOE, but stable with no cough or wheeze and he says he is not aware of lung disease.  Prior to Admission medications   Medication Sig Start Date End Date Taking? Authorizing Provider  aspirin 81 MG tablet Take 1 tablet (81 mg total) by mouth daily. 11/17/16  Yes Langeland, Dawn T, MD  clopidogrel (PLAVIX) 75 MG tablet Take 1 tablet (75 mg total) by mouth daily. 11/29/17  Yes Pricilla Riffleoss, Paula V, MD  empagliflozin (JARDIANCE) 10 MG TABS tablet Take 10 mg by mouth daily. 04/08/18  Yes [name], [name], MD  ezetimibe (ZETIA) 10 MG tablet Take 1 tablet (10 mg total) by mouth daily. 04/08/18  Yes [name], [name], MD  famotidine (PEPCID) 40 MG tablet Take 1 tablet (40 mg total) by mouth at bedtime. 02/03/18  Yes Weaver, Scott T, PA-C  fenofibrate (TRICOR) 145 MG tablet Take 1 tablet (145 mg total) by mouth daily. 04/12/18  Yes Weaver, Scott T, PA-C  furosemide (LASIX) 80 MG tablet TAKE 1 TABLET 2 TIMES DAILY BY MOUTH. 07/27/18  Yes Pricilla Riffleoss, Paula V, MD  glucose blood test strip Use as instructed 05/09/18  Yes [name],  [name], MD  Insulin Glargine (LANTUS SOLOSTAR) 100 UNIT/ML Solostar Pen Inject 68 Units into the skin 2 (two) times daily. 08/11/18  Yes [name], [name], MD  insulin lispro (HUMALOG KWIKPEN) 100 UNIT/ML KwikPen Inject 0.38 mLs (38 Units total) into the skin 3 (three) times daily. Take with meals 05/10/18  Yes [name], [name], MD  Insulin Pen Needle (PEN NEEDLES) 31G X 6 MM MISC Use as directed 05/10/18  Yes [name], [name], MD  Insulin Syringes, Disposable, U-100 0.5 ML MISC Use as directed 06/11/17  Yes [name], [name], MD  levothyroxine (SYNTHROID, LEVOTHROID) 200 MCG tablet Take 1 tablet (200 mcg total) by mouth daily. Dose increase. 04/06/18  Yes [name], [name], MD  metFORMIN (GLUCOPHAGE-XR) 500 MG 24 hr tablet Take 1 tablet (500 mg total) by mouth 2 (two) times daily. 08/11/18  Yes [name], [name], MD  metoprolol tartrate (LOPRESSOR) 50 MG tablet Take 1 tablet (50 mg total) by mouth 2 (two) times daily. 12/06/17  Yes [name], [name], MD  nitroGLYCERIN (NITROSTAT) 0.4 MG SL tablet Place 1 tablet (0.4 mg total) under the tongue every 5 (five) minutes as needed for chest pain. 09/17/15  Yes Rai, Ripudeep K, MD  pantoprazole (PROTONIX) 40 MG tablet Take 1 tablet (40 mg total) by mouth daily. 02/01/18  Yes Weaver, Scott T, PA-C  rosuvastatin (CRESTOR) 40 MG tablet Take 1 tablet (40 mg total) by mouth daily. 12/07/17  Yes Pricilla Riffleoss, Paula V, MD  sildenafil (VIAGRA) 50 MG tablet TAKE 1 TABLET (50 MG TOTAL) BY MOUTH DAILY AS NEEDED FOR ERECTILE DYSFUNCTION. 07/26/18  Yes Marcine Matar, MD  TRUEPLUS LANCETS 28G MISC Use as directed 12/01/17  Yes Marcine Matar, MD   Past Medical History:  Diagnosis Date  . Chronic diastolic CHF (congestive heart failure) (HCC) 03/09/2017   Echo 1/18: Mild LVH, EF 60-65, Gr 2 DD, mild LAE; difficult study-no obvious wall motion abnormalities with Definity  . CKD (chronic kidney disease)   . Coronary artery disease    a. s/pprior myocardial  infarction, s/p multipleprior stents placedat outside institutions with last intervention 08/2015 at [facility].  . Diabetes mellitus (HCC)   . Drug addiction in remission (HCC)   . Erectile dysfunction 03/02/2017  . Heartburn   . Hyperlipidemia associated with type 2 diabetes mellitus (HCC)   . Hypertension   . Hypothyroidism    a. adm with profound hypothyroidism near [facility] in 04/2017.  . MI (myocardial infarction) (HCC) 07/2009   [facility] in [place], [place] for last 2, 1st one in [place], [place]; total of 6 stents, last one 02/2014   . Morbid obesity (HCC) 07/06/2016  . Noncompliance with therapeutic plan    a. thyroid med -> profound hypothyroidism 04/2017.  Marland Kitchen OSA (obstructive sleep apnea) 07/06/2016   Moderate with AHI 21.8/hr by PSG 08/2012 now on CPAP  . Prolonged QT interval 05/14/2017  . Tobacco abuse   . Wide-complex tachycardia (HCC) 05/14/2017   Past Surgical History:  Procedure Laterality Date  . APPENDECTOMY    . CARDIAC CATHETERIZATION N/A 09/16/2015   Procedure: Left Heart Cath and Coronary Angiography;  Surgeon: Runell Gess, MD;  Location: [facility] INVASIVE CV LAB;  Service: Cardiovascular;  Laterality: N/A;  . CARDIAC CATHETERIZATION N/A 09/16/2015   Procedure: Coronary Stent Intervention;  Surgeon: Runell Gess, MD;  Location: [facility] INVASIVE CV LAB;  Service: Cardiovascular;  Laterality: N/A;  . CORONARY STENT PLACEMENT     Family History  Adopted: Yes  Problem Relation Age of Onset  . Diabetes Maternal Grandmother    Social History   Socioeconomic History  . Marital status: Divorced    Spouse name: Not on file  . Number of children: Not on file  . Years of education: Not on file  . Highest education level: Not on file  Occupational History  . Occupation: Company secretary  Social Needs  . Financial resource strain: Not on file  . Food insecurity:    Worry: Not on file    Inability: Not on file  . Transportation needs:    Medical: Not on file     Non-medical: Not on file  Tobacco Use  . Smoking status: Former Smoker    Types: Cigarettes    Last attempt to quit: 01/31/2017    Years since quitting: 1.5  . Smokeless tobacco: Never Used  . Tobacco comment: quit 01/2017  Substance and Sexual Activity  . Alcohol use: No    Alcohol/week: 0.0 standard drinks  . Drug use: No  . Sexual activity: Not on file  Lifestyle  . Physical activity:    Days per week: Not on file    Minutes per session: Not on file  . Stress: Not on file  Relationships  . Social connections:    Talks on phone: Not on file    Gets together: Not on file    Attends religious service: Not on file    Active member of club or organization:  Not on file    Attends meetings of clubs or organizations: Not on file    Relationship status: Not on file  . Intimate partner violence:    Fear of current or ex partner: Not on file    Emotionally abused: Not on file    Physically abused: Not on file    Forced sexual activity: Not on file  Other Topics Concern  . Not on file  Social History Narrative   Adopted. Very little info on biological parents, ?diabetes.   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 HEENT:    headaches, difficulty swallowing, tooth/dental problems, sore throat,       sneezing, itching, ear ache, nasal congestion, post nasal drip, snoring CV:    chest pain, orthopnea, PND, swelling in lower extremities, anasarca,                                  dizziness, palpitations Resp:   +shortness of breath with exertion or at rest.                productive cough,   non-productive cough, coughing up of blood.              change in color of mucus.  wheezing.   Skin:    rash or lesions. GI:  No-   heartburn, indigestion, abdominal pain, nausea, vomiting, diarrhea,                 change in bowel habits, loss of appetite GU: dysuria, change in color of urine,  no urgency or frequency.   flank pain. MS:   joint pain, stiffness, decreased range of motion, back pain. Neuro-     nothing unusual Psych:  change in mood or affect.  depression or anxiety.   memory loss.  OBJ- Physical Exam General- Alert, Oriented, Affect-appropriate, Distress- none acute, + obese Skin- rash-none, lesions- none, excoriation- none Lymphadenopathy- none Head- atraumatic            Eyes- Gross vision intact, PERRLA, conjunctivae and secretions clear            Ears- Hearing, canals-normal            Nose- Clear, no-Septal dev, mucus, polyps, erosion, perforation             Throat- Mallampati IV , mucosa clear , drainage- none, tonsils- atrophic Neck- flexible , trachea midline, no stridor , thyroid nl, carotid no bruit Chest - symmetrical excursion , unlabored           Heart/CV- RRR , no murmur , no gallop  , no rub, nl s1 s2                           - JVD- none , edema- none, stasis changes- none, varices- none           Lung- clear to P&A, wheeze- none, cough- none , dullness-none, rub- none           Chest wall-  Abd-  Br/ Gen/ Rectal- Not done, not indicated Extrem- cyanosis- none, clubbing, none, atrophy- none, strength- nl Neuro- grossly intact to observation

## 2018-08-24 ENCOUNTER — Telehealth: Payer: Self-pay | Admitting: Physician Assistant

## 2018-08-24 NOTE — Telephone Encounter (Signed)
New message:   Patient calling to ask a question concering his heart. Please call patient.

## 2018-08-24 NOTE — Telephone Encounter (Signed)
Returned pts call.  He had a question regarding the Diastolic Heart Failure that he had seen on my chart when he was looking through it today. Pt was explained that it was what they call Grade 2 Diastolic Heart Failure and the only thing to do in the future is to watch his salt / fluid intake and keep his bp controlled and to continue to f/u with his cardiologist.  Pt verbalized understanding and all of his questions were answered.

## 2018-08-25 NOTE — Telephone Encounter (Signed)
Agree 

## 2018-08-29 ENCOUNTER — Encounter: Payer: Self-pay | Admitting: Registered"

## 2018-08-29 ENCOUNTER — Encounter: Payer: Self-pay | Attending: Internal Medicine | Admitting: Registered"

## 2018-08-29 DIAGNOSIS — E1142 Type 2 diabetes mellitus with diabetic polyneuropathy: Secondary | ICD-10-CM | POA: Insufficient documentation

## 2018-08-29 NOTE — Patient Instructions (Addendum)
Remember to leave the insulin needle inserted to a count of 5-10 to ensure full dose gets in Consider going to the farmers market ask if have a program with SNAP benefits Try getting in vegetables every day Asparagus is an easy vegetable to cook and doesn't need much spice to taste good Continue to eat salad often Consider trying some of the arm chair exercises, look for peddle exerciser

## 2018-08-29 NOTE — Progress Notes (Addendum)
Diabetes Self-Management Education  Visit Type: First/Initial  Appt. Start Time: 0935 Appt. End Time: 1050  09/02/2018  [name], identified by name and date of birth, is a 48 y.o. male with a diagnosis of Diabetes: Type 2.   ASSESSMENT  Height 5' 5.5" (1.664 m), weight (!) 305 lb 8 oz (138.6 kg). Body mass index is 50.06 kg/m.   Pt sates he knows what to eat but doesn't have tools to overcome his unwillingness. Pt states he doesn't want to stop eating until he is full, can eat 3 pizzas. Pt states he can't stand leftovers. Pt wants to know if an appetite suppressant would help. (was not able to send message to Dr Laural Benes through Epic, faxed note per patient's request.)  Physical Activity: pt states barriers include hips & knees hurt.   Medications: Pt lapse in medications and potential variation in insulin delivered may help explain some of the variability in labs noted in his chart: POC BG office visits: 07/05/18 46 mg/dL, 52 mg/dL; 33/82/50 539 mg/dL. A1c over 6 months: 8.9% 11.9% 7.9%  RD asked patient about his experience in the office when he had the BG of 46 mg/dL Pt states he was unaware he was having low blood sugar, he just felt hungry, no other symptoms of hypoglycemia. Pt states he can tell when BG is high, he feels swollen and neuropathy worse.  Pt states he checks BG before meals, but doesn't know what he is supposed to do with this information, states ~1 yr ago was in 800s took more insulin  Metformin:Pt states he is still having some side effect of metfomin and won't take it if he knows he will be out in public.  Jardiance: Patient reports has not taken Jardiance past 2 days/ Rx was out was planning to pick up today.  Insulin: RD checked pt technique for injecting insulin - pt states he pulls his insulin needle out right after injection.   When not having loose stools d/t metformin, pt states it is hard to pass stool, no constipation. Pt states he doesn't like water  but drinks 1 gal day.  Pt states he moved in with parents d/t medical costs after heart attack. Pt states he mom cooks breakfast for him, but they eat like birds, for example they had klondike bar for supper. Pt states he doesn't like green stuff, except okra & broccoli  Pt states he is getting married in Sept.    Diabetes Self-Management Education - 08/29/18 0959      Visit Information   Visit Type  First/Initial      Initial Visit   Diabetes Type  Type 2    Are you currently following a meal plan?  Yes    What type of meal plan do you follow?  all you can eat    Are you taking your medications as prescribed?  Yes   except Marc Walker due to pharmacy out   Date Diagnosed  10-15 yrs      Health Coping   How would you rate your overall health?  Fair      Psychosocial Assessment   Patient Belief/Attitude about Diabetes  Other (comment)   angry   How often do you need to have someone help you when you read instructions, pamphlets, or other written materials from your doctor or pharmacy?  1 - Never    What is the last grade level you completed in school?  some college      Complications  Last HgB A1C per patient/outside source  8.9 %    How often do you check your blood sugar?  > 4 times/day    Fasting Blood glucose range (mg/dL)  545-625   63-893   Number of hypoglycemic episodes per month  --   has hypoglycemia unawareness   Have you had a dilated eye exam in the past 12 months?  No    Have you had a dental exam in the past 12 months?  Yes    Are you checking your feet?  No      Dietary Intake   Breakfast  grits, bacon (not a fan but wife cooks) egg OR cereal (not a fan), milk OR toaster streudel for convenience    Snack (morning)  none    Lunch  pizza OR [employer] spaghetti     Snack (afternoon)  none    Dinner  hotdogs OR chick fil-a 2 spicy chicken sand OR frito and salsa     Snack (evening)  oatmeal cream pie is whats there.      Exercise   Exercise Type  ADL's     How many days per week to you exercise?  0    How many minutes per day do you exercise?  0    Total minutes per week of exercise  0      Patient Education   Previous Diabetes Education  Yes (please comment)   hospital 2-3 yrs ago   Nutrition management   Role of diet in the treatment of diabetes and the relationship between the three main macronutrients and blood glucose level    Physical activity and exercise   Role of exercise on diabetes management, blood pressure control and cardiac health.    Medications  Reviewed patients medication for diabetes, action, purpose, timing of dose and side effects.;Other (comment)   proper insulin injection technique   Acute complications  Other (comment)   discussed hypoglycemic unawareness     Individualized Goals (developed by patient)   Nutrition  General guidelines for healthy choices and portions discussed;Other (comment)   resources to obtain vegetables   Physical Activity  --   look for appropriate exercise equipment   Medications  Other (comment)   leave insulin needle inserted for a few seconds before removing     Outcomes   Expected Outcomes  Demonstrated interest in learning. Expect positive outcomes    Future DMSE  4-6 wks    Program Status  Not Completed       Individualized Plan for Diabetes Self-Management Training:   Learning Objective:  Patient will have a greater understanding of diabetes self-management. Patient education plan is to attend individual and/or group sessions per assessed needs and concerns.     Patient Instructions  Remember to leave the insulin needle inserted to a count of 5-10 to ensure full dose gets in Consider going to the farmers market ask if have a program with SNAP benefits Try getting in vegetables every day Asparagus is an easy vegetable to cook and doesn't need much spice to taste good Continue to eat salad often Consider trying some of the arm chair exercises, look for peddle  exerciser   Expected Outcomes:  Demonstrated interest in learning. Expect positive outcomes, Arm Chair exercises  Education material provided: My Plate and No sodium seasonings  If problems or questions, patient to contact team via:  Phone  Future DSME appointment: 4-6 wks

## 2018-08-31 ENCOUNTER — Other Ambulatory Visit (HOSPITAL_BASED_OUTPATIENT_CLINIC_OR_DEPARTMENT_OTHER): Payer: Self-pay

## 2018-09-12 ENCOUNTER — Other Ambulatory Visit: Payer: Self-pay

## 2018-09-12 ENCOUNTER — Ambulatory Visit (HOSPITAL_BASED_OUTPATIENT_CLINIC_OR_DEPARTMENT_OTHER): Payer: No Typology Code available for payment source | Attending: Internal Medicine | Admitting: Radiology

## 2018-09-12 DIAGNOSIS — G4733 Obstructive sleep apnea (adult) (pediatric): Secondary | ICD-10-CM

## 2018-10-05 ENCOUNTER — Other Ambulatory Visit: Payer: Self-pay | Admitting: Internal Medicine

## 2018-10-05 MED FILL — $Viagra 50mg tablet: 50 | 30 days supply | Qty: 10 | Fill #1

## 2018-10-05 MED FILL — ?METOPROLOL 50 MG TABLET: 50 | 90 days supply | Qty: 180 | Fill #8

## 2018-10-05 MED FILL — ?FUROSEMIDE 80MG TABLET: 80 | 60 days supply | Qty: 120 | Fill #1

## 2018-10-05 MED FILL — ?PANTOPRAZOLE SOD DR 40MGTA: 40 | 90 days supply | Qty: 90 | Fill #7

## 2018-10-05 MED FILL — TRUEplus LANCETS 28G MISC: 25 days supply | Qty: 100 | Fill #7

## 2018-10-05 MED FILL — TRUE METRIX TEST STRIP: 25 days supply | Qty: 100 | Fill #4

## 2018-10-05 MED FILL — CLOPIDOGREL 75 MG TABLET: 75 | 30 days supply | Qty: 30 | Fill #9

## 2018-10-05 MED FILL — ?FENOFIBRATE 145 MG TABLET: 145 | 90 days supply | Qty: 90 | Fill #2

## 2018-10-05 MED FILL — ?METFORMIN HCL ER 500MG TAB: 500 | 90 days supply | Qty: 360 | Fill #5

## 2018-10-06 ENCOUNTER — Telehealth: Payer: Self-pay | Admitting: Pharmacist

## 2018-10-06 DIAGNOSIS — E785 Hyperlipidemia, unspecified: Secondary | ICD-10-CM

## 2018-10-06 DIAGNOSIS — I2510 Atherosclerotic heart disease of native coronary artery without angina pectoris: Secondary | ICD-10-CM

## 2018-10-06 MED ORDER — FENOFIBRATE 145 MG PO TABS: 145.0000 mg | ORAL_TABLET | Freq: Every day | ORAL | 3 refills | Status: DC

## 2018-10-06 MED ORDER — FUROSEMIDE 80 MG PO TABS: ORAL_TABLET | ORAL | 0 refills | Status: DC

## 2018-10-06 MED ORDER — CLOPIDOGREL BISULFATE 75 MG PO TABS: 75.0000 mg | ORAL_TABLET | Freq: Every day | ORAL | 3 refills | Status: DC

## 2018-10-06 MED ORDER — ROSUVASTATIN CALCIUM 40 MG PO TABS: 40.0000 mg | ORAL_TABLET | Freq: Every day | ORAL | 3 refills | Status: DC

## 2018-10-06 MED FILL — ?LEVOTHYROXINE 200 MCG TAB: 200 MCG | 30 days supply | Qty: 30 | Fill #0

## 2018-10-06 NOTE — Telephone Encounter (Signed)
Pt on schedule to see lipid clinic. Called to discuss cholesterol over the phone. He is currently taking and tolerating rosuvastatin 40mg , ezetimibe 10mg  and fenofibrate 145mg  daily. He did have repeat lipid panel about a month after initiation of therapy and all labs were at goal, TG<150, LDL<70.   He will continue on current therapy and call with any issues. He did request that refills be sent to the pharmacy if needed and this has been done.

## 2018-10-08 NOTE — Telephone Encounter (Signed)
Error

## 2018-10-11 ENCOUNTER — Ambulatory Visit: Payer: No Typology Code available for payment source | Admitting: Registered"

## 2018-10-11 ENCOUNTER — Ambulatory Visit: Payer: No Typology Code available for payment source

## 2018-10-20 ENCOUNTER — Other Ambulatory Visit: Payer: Self-pay

## 2018-10-20 ENCOUNTER — Encounter: Payer: Self-pay | Admitting: Internal Medicine

## 2018-10-20 DIAGNOSIS — R12 Heartburn: Secondary | ICD-10-CM

## 2018-10-21 ENCOUNTER — Other Ambulatory Visit: Payer: Self-pay | Admitting: Family Medicine

## 2018-10-21 MED ORDER — FAMOTIDINE 40 MG PO TABS: 40.0000 mg | ORAL_TABLET | Freq: Every day | ORAL | 0 refills | Status: AC

## 2018-10-21 MED FILL — ?ROSUVASTATIN CALCIUM 20MG: 20 | 30 days supply | Qty: 60 | Fill #0

## 2018-10-21 MED FILL — CLOPIDOGREL 75 MG TABLET: 75 | 30 days supply | Qty: 30 | Fill #0

## 2018-10-24 ENCOUNTER — Other Ambulatory Visit: Payer: Self-pay

## 2018-11-10 ENCOUNTER — Telehealth: Payer: Self-pay | Admitting: *Deleted

## 2018-11-10 NOTE — Telephone Encounter (Signed)
PT CONSENTED TO MYCHART VIDEO CONSENT

## 2018-11-14 ENCOUNTER — Encounter: Payer: Self-pay | Admitting: Internal Medicine

## 2018-11-14 NOTE — Progress Notes (Signed)
Virtual Visit via Video Note   This visit type was conducted due to national recommendations for restrictions regarding the COVID-19 Pandemic (e.g. social distancing) in an effort to limit this patient's exposure and mitigate transmission in our community.  Due to his co-morbid illnesses, this patient is at least at moderate risk for complications without adequate follow up.  This format is felt to be most appropriate for this patient at this time.  All issues noted in this document were discussed and addressed.  A limited physical exam was performed with this format.  Please refer to the patient's chart for his consent to telehealth for [facility]   Date:  11/15/2018   ID:  [name], DOB [birth year], MRN [id number]  Patient Location: Home Provider Location: Home  PCP:  Marcine Matar, MD  Cardiologist:  Dietrich Pates, MD   Electrophysiologist:  None  Sleep Medicine:  Armanda Magic, MD    Evaluation Performed:  Follow-Up Visit  Chief Complaint:  Follow up on CAD, CHF  History of Present Illness:    Ayomide Bailiff is a 48 y.o. male with CAD s/pprior myocardial infarction, s/p multipleprior stents placedat outside institutions, DM, HTN, HL with hypertriglyceridemia, tobacco abuse, OSA, diastolic HF. He was admitted to [facility] in 3/17 with unstable angina. LHC demonstrated 80% mid RCA stenosis treated with DES, 90% proximal LAD and 70% mid LAD stenosis involving the previous stent treated with DES, 20% RPDA in-stent restenosis treated with angioplasty. The stent in OM2 was patent. Residual disease included 90% D2 stenosis and 70% mid LCx stenosis, treated medically.  He was last seen in clinic in 07/2018 by Manson Passey, PA-C.  Today, he notes he is doing well.  He denies orthopnea, PND, significant lower extremity swelling.  His weights have been stable.  He does have some chest discomfort if he does more extreme exertion.  This has been a chronic symptom for  years without change.  He notes dyspnea with some activities but this is overall stable.  He has not had syncope.  The patient does not have symptoms concerning for COVID-19 infection (fever, chills, cough, or new shortness of breath).    Past Medical History:  Diagnosis Date   Chronic diastolic CHF (congestive heart failure) (HCC) 03/09/2017   Echo 1/18: Mild LVH, EF 60-65, Gr 2 DD, mild LAE; difficult study-no obvious wall motion abnormalities with Definity   CKD (chronic kidney disease)    Coronary artery disease    a. s/pprior myocardial infarction, s/p multipleprior stents placedat outside institutions with last intervention 08/2015 at [facility].   Diabetes mellitus (HCC)    Drug addiction in remission [facility])    Erectile dysfunction 03/02/2017   Heartburn    Hyperlipidemia associated with type 2 diabetes mellitus (HCC)    Hypertension    Hypothyroidism    a. adm with profound hypothyroidism near myedema in 04/2017.   MI (myocardial infarction) (HCC) 07/2009   [facility] in [place], [place] for last 2, 1st one in [place], [place]; total of 6 stents, last one 02/2014    Morbid obesity (HCC) 07/06/2016   Noncompliance with therapeutic plan    a. thyroid med -> profound hypothyroidism 04/2017.   OSA (obstructive sleep apnea) 07/06/2016   Moderate with AHI 21.8/hr by PSG 08/2012 now on CPAP   Prolonged QT interval 05/14/2017   Tobacco abuse    Wide-complex tachycardia (HCC) 05/14/2017   Past Surgical History:  Procedure Laterality Date   APPENDECTOMY  CARDIAC CATHETERIZATION N/A 09/16/2015   Procedure: Left Heart Cath and Coronary Angiography;  Surgeon: Runell GessJonathan J Berry, MD;  [facility];  Service: Cardiovascular;  Laterality: N/A;   CARDIAC CATHETERIZATION N/A 09/16/2015   Procedure: Coronary Stent Intervention;  Surgeon: Runell GessJonathan J Berry, MD;  Location: [facility] INVASIVE CV LAB;  Service: Cardiovascular;  Laterality: N/A;   CORONARY STENT PLACEMENT        Current Meds  Medication Sig   aspirin 81 MG tablet Take 1 tablet (81 mg total) by mouth daily.   clopidogrel (PLAVIX) 75 MG tablet Take 1 tablet (75 mg total) by mouth daily.   empagliflozin (JARDIANCE) 10 MG TABS tablet Take 10 mg by mouth daily.   ezetimibe (ZETIA) 10 MG tablet Take 1 tablet (10 mg total) by mouth daily.   famotidine (PEPCID) 40 MG tablet Take 1 tablet (40 mg total) by mouth at bedtime.   fenofibrate (TRICOR) 145 MG tablet Take 1 tablet (145 mg total) by mouth daily.   furosemide (LASIX) 80 MG tablet TAKE 1 TABLET 2 TIMES DAILY BY MOUTH.   glucose blood test strip Use as instructed   Insulin Glargine (LANTUS SOLOSTAR) 100 UNIT/ML Solostar Pen Inject 68 Units into the skin 2 (two) times daily.   insulin lispro (HUMALOG KWIKPEN) 100 UNIT/ML KwikPen Inject 0.38 mLs (38 Units total) into the skin 3 (three) times daily. Take with meals   Insulin Pen Needle (PEN NEEDLES) 31G X 6 MM MISC Use as directed   Insulin Syringes, Disposable, U-100 0.5 ML MISC Use as directed   levothyroxine (SYNTHROID) 200 MCG tablet TAKE 1 TABLET (200 MCG TOTAL) BY MOUTH DAILY. (DOSE INCREASE)   metFORMIN (GLUCOPHAGE-XR) 500 MG 24 hr tablet Take 1 tablet (500 mg total) by mouth 2 (two) times daily.   metoprolol tartrate (LOPRESSOR) 50 MG tablet Take 1 tablet (50 mg total) by mouth 2 (two) times daily.   pantoprazole (PROTONIX) 40 MG tablet Take 1 tablet (40 mg total) by mouth daily.   rosuvastatin (CRESTOR) 40 MG tablet Take 1 tablet (40 mg total) by mouth daily.   sildenafil (VIAGRA) 50 MG tablet TAKE 1 TABLET (50 MG TOTAL) BY MOUTH DAILY AS NEEDED FOR ERECTILE DYSFUNCTION.   TRUEPLUS LANCETS 28G MISC Use as directed     Allergies:   Levofloxacin; Nsaids; and Other   Social History   Tobacco Use   Smoking status: Former Smoker    Types: Cigarettes    Last attempt to quit: 01/31/2017    Years since quitting: 1.7   Smokeless tobacco: Never Used   Tobacco comment: quit  01/2017  Substance Use Topics   Alcohol use: No    Alcohol/week: 0.0 standard drinks   Drug use: No     Family Hx: The patient's family history includes Diabetes in his maternal grandmother. He was adopted.  ROS:   Please see the history of present illness.     All other systems reviewed and are negative.   Prior CV studies:   The following studies were reviewed today:   Echo 05/14/17 Mild conc LVH, EF 60-65, Gr 2 DD  Echocardiogram 07/10/16 Mild LVH, EF 60-65,Gr2DD, mild LAE; difficult study-no obvious wall motion abnormalities with Definity  Myoview 03/26/16  Low risk stress nuclear study with mild ischemia in the distribution of a distal OM or PLA branch. EF 55  LHC 09/16/15 LAD ostial stent ok, prox 90%, mid 70% ISR, D2 90% LCx mid 70%, OM2 stent ok RCA mid 80%, dist stent  ok, RPDA ostial stent ok with 20% ISR,  PCI: 2.5 x 20 mm Synergy DES to prox/mid LAD; 3.25 x 18 mm Xience Alpine DES to mid RCA; Angiosculpt POBA to RPDA   Labs/Other Tests and Data Reviewed:    EKG:  No ECG reviewed.  Recent Labs: 05/23/2018: ALT 17 07/05/2018: TSH 2.430 07/09/2018: B Natriuretic Peptide 264.5; Hemoglobin 13.1; Platelets 271 08/11/2018: BUN 25; Creatinine, Ser 1.10; Potassium 4.3; Sodium 140   Recent Lipid Panel Lab Results  Component Value Date/Time   CHOL 96 (L) 05/23/2018 09:52 AM   TRIG 132 05/23/2018 09:52 AM   HDL 42 05/23/2018 09:52 AM   CHOLHDL 2.3 05/23/2018 09:52 AM   CHOLHDL 2.9 07/10/2016 04:16 AM   LDLCALC 28 05/23/2018 09:52 AM    Wt Readings from Last 3 Encounters:  11/15/18 (!) 308 lb (139.7 kg)  11/15/18 (!) 308 lb (139.7 kg)  08/29/18 (!) 305 lb 8 oz (138.6 kg)     Objective:    Vital Signs:  BP (!) 144/53    Pulse 74    Ht  (1.549 m)    Wt (!) 308 lb (139.7 kg)    BMI 58.20 kg/m    VITAL SIGNS:  reviewed GEN:  no acute distress EYES:  Wearing glasses RESPIRATORY:  Normal respiratory effort NEURO:  Alert and oriented PSYCH:   normal affect  ASSESSMENT & PLAN:     Coronary 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  Nuclear stress test in 2017 was low risk.    He notes chest discomfort with more extreme activities.  This is overall stable without significant change.  Continue aspirin, Plavix, statin, beta-blocker.  Chronic diastolic CHF (congestive heart failure) (HCC) EF by echo in 2018 60-65 with moderate diastolic dysfunction.  Overall, volume status seems to be stable.  Continue current therapy.  Essential hypertension The patient's blood pressure is controlled on his current regimen.  Continue current therapy.   Mixed hyperlipidemia LDL optimal on most recent lab work.  Continue current Rx.    OSA on CPAP Continue CPAP.  Morbid obesity (HCC) He knows that he needs to lose weight.  He will continue to work on this.  COVID-19 Education: The signs and symptoms of COVID-19 were discussed with the patient and how to seek care for testing (follow up with PCP or arrange E-visit).  The importance of social distancing was discussed today.  He did ask about taking hydroxychloroquine.  I advised against this as it is not recommended at this time.  Clinical trials have so far shown harm and no benefit.  Time:   Today, I have spent 24 minutes with the patient with telehealth technology discussing the above problems.     Medication Adjustments/Labs and Tests Ordered: Current medicines are reviewed at length with the patient today.  Concerns regarding medicines are outlined above.   Tests Ordered: No orders of the defined types were placed in this encounter.   Medication Changes: No orders of the defined types were placed in this encounter.   Disposition:  Follow up in 6 month(s)  Signed, [name], PA-C  11/15/2018 10:25 AM    [facility]

## 2018-11-15 ENCOUNTER — Telehealth (INDEPENDENT_AMBULATORY_CARE_PROVIDER_SITE_OTHER): Payer: No Typology Code available for payment source | Admitting: Physician Assistant

## 2018-11-15 ENCOUNTER — Encounter: Payer: Self-pay | Admitting: Internal Medicine

## 2018-11-15 ENCOUNTER — Encounter: Payer: Self-pay | Admitting: Physician Assistant

## 2018-11-15 ENCOUNTER — Other Ambulatory Visit: Payer: Self-pay

## 2018-11-15 ENCOUNTER — Ambulatory Visit: Payer: Self-pay | Attending: Internal Medicine | Admitting: Internal Medicine

## 2018-11-15 VITALS — BP 114/53 | HR 74 | Temp 98.3°F | Wt 308.0 lb

## 2018-11-15 VITALS — BP 114/53 | HR 74 | Ht 61.0 in | Wt 308.0 lb

## 2018-11-15 DIAGNOSIS — R000 Tachycardia, unspecified: Secondary | ICD-10-CM

## 2018-11-15 DIAGNOSIS — Z9989 Dependence on other enabling machines and devices: Secondary | ICD-10-CM

## 2018-11-15 DIAGNOSIS — Z7189 Other specified counseling: Secondary | ICD-10-CM

## 2018-11-15 DIAGNOSIS — M65331 Trigger finger, right middle finger: Secondary | ICD-10-CM

## 2018-11-15 DIAGNOSIS — I10 Essential (primary) hypertension: Secondary | ICD-10-CM

## 2018-11-15 DIAGNOSIS — Z794 Long term (current) use of insulin: Secondary | ICD-10-CM

## 2018-11-15 DIAGNOSIS — G4733 Obstructive sleep apnea (adult) (pediatric): Secondary | ICD-10-CM

## 2018-11-15 DIAGNOSIS — I25118 Atherosclerotic heart disease of native coronary artery with other forms of angina pectoris: Secondary | ICD-10-CM

## 2018-11-15 DIAGNOSIS — I472 Ventricular tachycardia: Secondary | ICD-10-CM

## 2018-11-15 DIAGNOSIS — E039 Hypothyroidism, unspecified: Secondary | ICD-10-CM

## 2018-11-15 DIAGNOSIS — I5032 Chronic diastolic (congestive) heart failure: Secondary | ICD-10-CM

## 2018-11-15 DIAGNOSIS — N529 Male erectile dysfunction, unspecified: Secondary | ICD-10-CM

## 2018-11-15 DIAGNOSIS — E1165 Type 2 diabetes mellitus with hyperglycemia: Secondary | ICD-10-CM

## 2018-11-15 DIAGNOSIS — L989 Disorder of the skin and subcutaneous tissue, unspecified: Secondary | ICD-10-CM

## 2018-11-15 DIAGNOSIS — E782 Mixed hyperlipidemia: Secondary | ICD-10-CM

## 2018-11-15 MED ORDER — SILDENAFIL CITRATE 50 MG PO TABS: ORAL_TABLET | ORAL | 6 refills | Status: DC

## 2018-11-15 MED ORDER — EMPAGLIFLOZIN 10 MG PO TABS: 10.0000 mg | ORAL_TABLET | Freq: Every day | ORAL | 3 refills | Status: DC

## 2018-11-15 MED ORDER — ROSUVASTATIN CALCIUM 40 MG PO TABS: 40.0000 mg | ORAL_TABLET | Freq: Every day | ORAL | 11 refills | Status: DC

## 2018-11-15 MED ORDER — LEVOTHYROXINE SODIUM 200 MCG PO TABS: ORAL_TABLET | ORAL | 6 refills | Status: DC

## 2018-11-15 NOTE — Progress Notes (Signed)
Pt blood sugar this morning was 118

## 2018-11-15 NOTE — Patient Instructions (Signed)
Medication Instructions:  Continue your current medications  If you need a refill on your cardiac medications before your next appointment, please call your pharmacy.   Lab work: None  If you have labs (blood work) drawn today and your tests are completely normal, you will receive your results only by: Marland Kitchen MyChart Message (if you have MyChart) OR . A paper copy in the mail If you have any lab test that is abnormal or we need to change your treatment, we will call you to review the results.  Testing/Procedures: None  Follow-Up: At [facility], you and your health needs are our priority.  As part of our continuing mission to provide you with exceptional heart care, we have created designated Provider Care Teams.  These Care Teams include your primary Cardiologist (physician) and Advanced Practice Providers (APPs -  Physician Assistants and Nurse Practitioners) who all work together to provide you with the care you need, when you need it. You will need a follow up appointment in:  6 months.  Please call our office 2 months in advance to schedule this appointment.  You may see Dietrich Pates, MD or [name], PA-C   Any Other Special Instructions Will Be Listed Below (If Applicable).

## 2018-11-15 NOTE — Progress Notes (Signed)
Virtual Visit via Telephone Note Due to current restrictions/limitations of in-office visits due to the COVID-19 pandemic, this scheduled clinical appointment was converted to a telehealth visit  I connected with Marc Walker on 11/15/18 at 9:32 a.m EDT by telephone and verified that I am speaking with the correct person using two identifiers. I am in my office.  The patient is at home.  Only the patient and myself participated in this encounter.  I discussed the limitations, risks, security and privacy concerns of performing an evaluation and management service by telephone and the availability of in person appointments. I also discussed with the patient that there may be a patient responsible charge related to this service. The patient expressed understanding and agreed to proceed.   History of Present Illness:  Pt with history of CAD (8stents), DM type II, HL, HTN, obesity, OSA on CPAP,formertobacco dependence, BPH, hypothyroid.   OSA: Since last visit with me patient had seen sleep specialist Dr. Maple HudsonYoung.  Ideally patient should be on BiPAP but cannot afford.  He was able to get 2 new masks and hoses for his CPAP machine.  "It has made a vast difference."  CAD/diastolic CHF/HTN: Saw cardiology in February and has a follow-up virtual visit with them later today.  History of WCT per Dr. Charlott Rakesoss's note 08/2017.  Goal is to avoid medications that prolong QT -checks BP 2 x a day.  Gives range 100-130/70s.   -Limits salt in foods.  Wgh up 4 lbs since last visit -LE edema off and on but not at this time.  CP and SOB only "if I exert myself real good." -reports compliance with meds including Furosemide.    DM/Obesity: Referred to nutritionist on last visit.  Saw her 1 time and had to cancel last appt due to COVID-19.  Found it helpful "but sometimes I find myself eating against my will."  He states he will try to quit eating "when I'm not hungry."  -Admits that he was not as consistent as he should  have been in taking his insulins. Doing better now.  On Jardiance, Lantus 68 U BID, Humalog 34 units TID and metformin 500 mg twice daily. -Checks blood sugars TID before meals.  Reports range 100-150.  Occasionlly in 200-220s -Still has not been able to afford eye exam.  Contacted the [employer] to see if they can help him to get free eye exam. -toenails are thick and over grown and needs to see podiatrist but he is [facility] discount/OC has expired  Hypothyroid:  Reports compliance with Levothyroxine  Reports having a "dent in my shin" on RT x few wks.  Small sore has developed there.  Thinks wearing compression socks contributes.   "My RT middle finger starting to lock up a lot more and painful when it happens."  He has history of trigger finger.  Observations/Objective: Blood pressure (!) 114/53, pulse 74, temperature 98.3 F (36.8 C), weight (!) 308 lb (139.7 kg).    Chemistry      Component Value Date/Time   NA 140 08/11/2018 1028   K 4.3 08/11/2018 1028   CL 98 08/11/2018 1028   CO2 25 08/11/2018 1028   BUN 25 (H) 08/11/2018 1028   CREATININE 1.10 08/11/2018 1028   CREATININE 0.70 06/15/2016 1037      Component Value Date/Time   CALCIUM 9.8 08/11/2018 1028   ALKPHOS 62 05/23/2018 0952   AST 13 05/23/2018 0952   ALT 17 05/23/2018 0952   BILITOT 0.2 05/23/2018 [id number]  Lab Results  Component Value Date   CHOL 96 (L) 05/23/2018   HDL 42 05/23/2018   LDLCALC 28 05/23/2018   TRIG 132 05/23/2018   CHOLHDL 2.3 05/23/2018     Assessment and Plan: 1. Uncontrolled type 2 diabetes mellitus with hyperglycemia, with long-term current use of insulin (HCC) Encourage compliance with his medications. Encourage eating high-protein snacks to help decrease hunger between meals.  Also encouraged him to set a weight loss goal - empagliflozin (JARDIANCE) 10 MG TABS tablet; Take 10 mg by mouth daily.  Dispense: 90 tablet; Refill: 3  2. OSA on CPAP Doing better on CPAP now that he has  new mask and hose Continue compliance  3. Essential hypertension Reported home blood pressure readings are at goal.  Continue current medications  4. Chronic diastolic CHF (congestive heart failure) (HCC) History suggests that he is compensated. Continue furosemide and low-salt diet  5. Coronary artery disease of native artery of native heart with stable angina pectoris (HCC) Stable.  Continue aspirin, Plavix, metoprolol, his cholesterol medications including Crestor, TriCor and Zetia  6. Erectile dysfunction, unspecified erectile dysfunction type - sildenafil (VIAGRA) 50 MG tablet; TAKE 1 TABLET (50 MG TOTAL) BY MOUTH DAILY AS NEEDED FOR ERECTILE DYSFUNCTION.  Dispense: 10 tablet; Refill: 6  7. Morbid obesity (HCC) See #1 above  8. Hypothyroidism, unspecified type - levothyroxine (SYNTHROID) 200 MCG tablet; TAKE 1 TABLET (200 MCG TOTAL) BY MOUTH DAILY. (DOSE INCREASE)  Dispense: 30 tablet; Refill: 6  9. Trigger middle finger of right hand Can refer to orthopedics once he has the [employer] discount  10. Sore on leg Offered patient an in person visit for me to evaluate it.  Patient states that it is not big in size and he is not worried about it.  I requested that he upload a picture of it to Mychart so that I can take a look.  He said he will do so today.    11. Wide-complex tachycardia (HCC) We will try to avoid medications that cause prolonged QT   Follow Up Instructions: F/u in 3 mths   I discussed the assessment and treatment plan with the patient. The patient was provided an opportunity to ask questions and all were answered. The patient agreed with the plan and demonstrated an understanding of the instructions.   The patient was advised to call back or seek an in-person evaluation if the symptoms worsen or if the condition fails to improve as anticipated.  I provided 23 minutes of non-face-to-face time during this encounter.   Jonah Blue, MD

## 2018-11-16 ENCOUNTER — Encounter: Payer: Self-pay | Admitting: Internal Medicine

## 2018-11-24 ENCOUNTER — Ambulatory Visit: Payer: Self-pay | Admitting: Internal Medicine

## 2018-11-27 ENCOUNTER — Encounter: Payer: Self-pay | Admitting: Internal Medicine

## 2018-12-01 ENCOUNTER — Other Ambulatory Visit: Payer: Self-pay

## 2018-12-01 ENCOUNTER — Ambulatory Visit (INDEPENDENT_AMBULATORY_CARE_PROVIDER_SITE_OTHER): Payer: No Typology Code available for payment source | Admitting: Internal Medicine

## 2018-12-01 ENCOUNTER — Ambulatory Visit: Payer: Self-pay | Attending: Internal Medicine | Admitting: Internal Medicine

## 2018-12-01 ENCOUNTER — Encounter: Payer: Self-pay | Admitting: Internal Medicine

## 2018-12-01 VITALS — BP 135/81 | HR 66 | Temp 98.3°F | Resp 16 | Wt 295.4 lb

## 2018-12-01 DIAGNOSIS — Z9989 Dependence on other enabling machines and devices: Secondary | ICD-10-CM

## 2018-12-01 DIAGNOSIS — G4733 Obstructive sleep apnea (adult) (pediatric): Secondary | ICD-10-CM

## 2018-12-01 DIAGNOSIS — R0609 Other forms of dyspnea: Secondary | ICD-10-CM

## 2018-12-01 DIAGNOSIS — L989 Disorder of the skin and subcutaneous tissue, unspecified: Secondary | ICD-10-CM

## 2018-12-01 DIAGNOSIS — L988 Other specified disorders of the skin and subcutaneous tissue: Secondary | ICD-10-CM

## 2018-12-01 DIAGNOSIS — S80821A Blister (nonthermal), right lower leg, initial encounter: Secondary | ICD-10-CM

## 2018-12-01 MED ORDER — TRIAMCINOLONE ACETONIDE 0.1 % EX CREA: 1.0000 "application " | TOPICAL_CREAM | Freq: Every day | CUTANEOUS | 0 refills | Status: DC

## 2018-12-01 MED ORDER — MUPIROCIN 2 % EX OINT: 1.0000 "application " | TOPICAL_OINTMENT | Freq: Two times a day (BID) | CUTANEOUS | 0 refills | Status: DC

## 2018-12-01 MED FILL — TRIAMCINOLONE ACETONIDE 0.1: 0.1 | 15 days supply | Qty: 30 | Fill #0

## 2018-12-01 MED FILL — MUPIROCIN 2% OINTMENT: 2 | 11 days supply | Qty: 22 | Fill #0

## 2018-12-01 NOTE — Patient Instructions (Signed)
Order- walk test on room air- O2 qulaifying   Dx Dyspnea on exertion  Continue CPAP 18  Suggest you look into buying CPAP supplies on line at a site like [employer]  Please call if we can help

## 2018-12-01 NOTE — Progress Notes (Signed)
Patient ID: Marc Walker, male    DOB: [birth year]  MRN: [id number]  CC: sores on lags Subjective: Marc Walker is a 48 y.o. male who presents for UC visit for sores on legs His concerns today include:  Pt with history of CAD (8stents), DM type II, HL, HTN, obesity, OSA on CPAP,formertobacco dependence, BPH, hypothyroid.  Patient reports having a scabbed area that forms intermittently to the right lower leg x several years.  Appeared several weeks ago with some itching.  He denies any initiating factors.  Recently noted some blisters also forming around it.  He noticed that he has irritation and itching in this area whenever he tries to use his compression socks. About 2 weeks ago after he spoke with me, he noticed a similar area on the left lower extremity.  He denies any trauma to the area that he recalls. He has not had any increased lower extremity edema.  He is taking his fluid pill as prescribed.  Patient Active Problem List   Diagnosis Date Noted  . CAD S/P multiple PCI's 08/09/2017  . Prolonged QT interval 05/14/2017  . Wide-complex tachycardia (HCC) 05/14/2017  . GERD (gastroesophageal reflux disease) 05/13/2017  . Hypothyroidism   . Bilateral carpal tunnel syndrome 04/08/2017  . Chronic diastolic CHF (congestive heart failure) (HCC) 03/09/2017  . Erectile dysfunction 03/02/2017  . OSA on CPAP 07/06/2016  . Morbid obesity (HCC) 07/06/2016  . DM type 2 with diabetic peripheral neuropathy (HCC) 12/16/2015  . CAD (coronary artery disease) 09/30/2015  . Essential hypertension 09/16/2015  . Hyperlipidemia      Current Outpatient Medications on File Prior to Visit  Medication Sig Dispense Refill  . aspirin 81 MG tablet Take 1 tablet (81 mg total) by mouth daily. 90 tablet 3  . clopidogrel (PLAVIX) 75 MG tablet Take 1 tablet (75 mg total) by mouth daily. 90 tablet 3  . empagliflozin (JARDIANCE) 10 MG TABS tablet Take 10 mg by mouth daily. 90 tablet 3  . ezetimibe  (ZETIA) 10 MG tablet Take 1 tablet (10 mg total) by mouth daily. 90 tablet 3  . famotidine (PEPCID) 40 MG tablet Take 1 tablet (40 mg total) by mouth at bedtime. 90 tablet 0  . fenofibrate (TRICOR) 145 MG tablet Take 1 tablet (145 mg total) by mouth daily. 90 tablet 3  . furosemide (LASIX) 80 MG tablet TAKE 1 TABLET 2 TIMES DAILY BY MOUTH. 180 tablet 0  . glucose blood test strip Use as instructed 100 each 12  . Insulin Glargine (LANTUS SOLOSTAR) 100 UNIT/ML Solostar Pen Inject 68 Units into the skin 2 (two) times daily. 5 pen 11  . insulin lispro (HUMALOG KWIKPEN) 100 UNIT/ML KwikPen Inject 0.38 mLs (38 Units total) into the skin 3 (three) times daily. Take with meals 15 mL 11  . Insulin Pen Needle (PEN NEEDLES) 31G X 6 MM MISC Use as directed 100 each 6  . Insulin Syringes, Disposable, U-100 0.5 ML MISC Use as directed 100 each 11  . levothyroxine (SYNTHROID) 200 MCG tablet TAKE 1 TABLET (200 MCG TOTAL) BY MOUTH DAILY. (DOSE INCREASE) 30 tablet 6  . metFORMIN (GLUCOPHAGE-XR) 500 MG 24 hr tablet Take 1 tablet (500 mg total) by mouth 2 (two) times daily. 60 tablet 11  . metoprolol tartrate (LOPRESSOR) 50 MG tablet Take 1 tablet (50 mg total) by mouth 2 (two) times daily. 180 tablet 3  . pantoprazole (PROTONIX) 40 MG tablet Take 1 tablet (40 mg total) by mouth daily. 90 tablet  3  . rosuvastatin (CRESTOR) 40 MG tablet Take 1 tablet (40 mg total) by mouth daily. 30 tablet 11  . sildenafil (VIAGRA) 50 MG tablet TAKE 1 TABLET (50 MG TOTAL) BY MOUTH DAILY AS NEEDED FOR ERECTILE DYSFUNCTION. 10 tablet 6  . TRUEPLUS LANCETS 28G MISC Use as directed 100 each 12   No current facility-administered medications on file prior to visit.     Allergies  Allergen Reactions  . Levofloxacin Rash    Rash on back (not sun exposed)and hypersensitivity to skin on exposed skin/arm Rash on back (not sun exposed)and hypersensitivity to skin on exposed skin/arm  . Nsaids     Avoid due to CKD  . 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  Pt only wants pain medicine if absolutely necessary.    Social History   Socioeconomic History  . Marital status: Divorced    Spouse name: Not on file  . Number of children: Not on file  . Years of education: Not on file  . Highest education level: Not on file  Occupational History  . 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 Financial resource strain: Not on file  . Food insecurity:    Worry: Not on file    Inability: Not on file  . Transportation needs:    Medical: Not on file    Non-medical: Not on file  Tobacco Use  . Smoking status: Former Smoker    Types: Cigarettes    Last attempt to quit: 01/31/2017    Years since quitting: 1.8  . Smokeless tobacco: Never Used  . Tobacco comment: quit 01/2017  Substance and Sexual Activity  . Alcohol use: No    Alcohol/week: 0.0 standard drinks  . Drug use: No  . Sexual activity: Not on file  Lifestyle  . Physical activity:    Days per week: Not on file    Minutes per session: Not on file  . Stress: Not on file  Relationships  . Social connections:    Talks on phone: Not on file    Gets together: Not on file    Attends religious service: Not on file    Active member of club or organization: Not on file    Attends meetings of clubs or organizations: Not on file    Relationship status: Not on file  . Intimate partner violence:    Fear of current or ex partner: Not on file    Emotionally abused: Not on file    Physically abused: Not on file    Forced sexual activity: Not on file  Other Topics Concern  . Not on file  Social History Narrative   Adopted. Very little info on biological parents, ?diabetes.   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 Diabetes Maternal Grandmother     Past Surgical History:  Procedure Laterality Date  . APPENDECTOMY    . CARDIAC CATHETERIZATION N/A 09/16/2015   Procedure: Left Heart  Cath and Coronary Angiography;  Surgeon: Runell GessJonathan J Berry, MD;  [facility];  Service: Cardiovascular;  Laterality: N/A;  . CARDIAC CATHETERIZATION N/A 09/16/2015   Procedure: Coronary Stent Intervention;  Surgeon: Runell GessJonathan J Berry, MD;  Location: [facility] INVASIVE CV LAB;  Service: Cardiovascular;  Laterality: N/A;  . CORONARY STENT PLACEMENT      ROS: Review of Systems Negative except as  stated above  PHYSICAL EXAM: BP 135/81   Pulse 66   Temp 98.3 F (36.8 C) (Oral)   Resp 16   Wt 295 lb 6.4 oz (134 kg)   SpO2 96%   BMI 55.82 kg/m   Physical Exam General appearance - alert, well appearing, and in no distress Mental status - normal mood, behavior, speech, dress, motor activity, and thought processes Skin -1 cm scabbed area over the mid left shin.  No surrounding erythema.  It is not tender to touch. 2 areas of blistering noted on the midportion of the right lower leg.  0.5 cm partially scabbed area also noted.  No surrounding erythema  RT LE   LT LE     CMP Latest Ref Rng & Units 08/11/2018 07/09/2018 07/05/2018  Glucose 65 - 99 mg/dL 132(G) 95 40(N)  BUN 6 - 24 mg/dL 02(V) 14 13  Creatinine 0.76 - 1.27 mg/dL 2.53 6.64 4.03(K)  Sodium 134 - 144 mmol/L 140 137 144  Potassium 3.5 - 5.2 mmol/L 4.3 3.7 3.5  Chloride 96 - 106 mmol/L 98 103 106  CO2 20 - 29 mmol/L 25 24 22   Calcium 8.7 - 10.2 mg/dL 9.8 9.2 9.5  Total Protein 6.0 - 8.5 g/dL - - -  Total Bilirubin 0.0 - 1.2 mg/dL - - -  Alkaline Phos 39 - 117 IU/L - - -  AST 0 - 40 IU/L - - -  ALT 0 - 44 IU/L - - -   Lipid Panel     Component Value Date/Time   CHOL 96 (L) 05/23/2018 0952   TRIG 132 05/23/2018 0952   HDL 42 05/23/2018 0952   CHOLHDL 2.3 05/23/2018 0952   CHOLHDL 2.9 07/10/2016 0416   VLDL 18 07/10/2016 0416   LDLCALC 28 05/23/2018 0952    CBC    Component Value Date/Time   WBC 10.0 07/09/2018 1609   RBC 4.81 07/09/2018 1609   HGB 13.1 07/09/2018 1609   HGB 13.8 07/05/2018 1618   HCT  42.7 07/09/2018 1609   HCT 43.1 07/05/2018 1618   PLT 271 07/09/2018 1609   PLT 321 07/05/2018 1618   MCV 88.8 07/09/2018 1609   MCV 86 07/05/2018 1618   MCH 27.2 07/09/2018 1609   MCHC 30.7 07/09/2018 1609   RDW 14.8 07/09/2018 1609   RDW 14.5 07/05/2018 1618   LYMPHSABS 2.4 07/09/2018 1609   LYMPHSABS 5.7 (H) 07/05/2018 1618   MONOABS 0.9 07/09/2018 1609   EOSABS 0.7 (H) 07/09/2018 1609   EOSABS 0.9 (H) 07/05/2018 1618   BASOSABS 0.1 07/09/2018 1609   BASOSABS 0.1 07/05/2018 1618    ASSESSMENT AND PLAN: 1. Blister of right lower leg, initial encounter Patient will hold off on wearing his compression hose/socks. We will have him use antibiotic ointment twice a day to these areas. Use triamcinolone cream in the evenings - mupirocin ointment (BACTROBAN) 2 %; Place 1 application into the nose 2 (two) times daily.  Dispense: 22 g; Refill: 0 - triamcinolone cream (KENALOG) 0.1 %; Apply 1 application topically at bedtime. To right lower leg  Dispense: 30 g; Refill: 0  2. Leg lesion - mupirocin ointment (BACTROBAN) 2 %; Place 1 application into the nose 2 (two) times daily.  Dispense: 22 g; Refill: 0   Patient was given the opportunity to ask questions.  Patient verbalized understanding of the plan and was able to repeat key elements of the plan.   No orders of the defined types were placed in this encounter.  Requested Prescriptions   Signed Prescriptions Disp Refills  . mupirocin ointment (BACTROBAN) 2 % 22 g 0    Sig: Place 1 application into the nose 2 (two) times daily.  Marland Kitchen triamcinolone cream (KENALOG) 0.1 % 30 g 0    Sig: Apply 1 application topically at bedtime. To right lower leg    No follow-ups on file.  Jonah Blue, MD, FACP

## 2018-12-01 NOTE — Progress Notes (Signed)
HPI M former smoker followed for OSA, complicated by chronic diastolic CHF, CAD status post multiple PCI's, GERD, hypothyroid, DM 2 with peripheral neuropathy, morbid obesity NPSG 09/15/12- [facility]- AHI 21.8/ hr, desaturation to 62%, CPAP titrated to 13 Walk test on room air 12/01/2018-  Min sat 93%, Max HR 94/ min.   ----------------------------------------------------------------------------------------------- 24/2020-48 year old male former smoker for sleep evaluation. On cpap therapy now since 2012. Needs new mask.Does not have a DME company  NPSG 09/15/12- [facility]- AHI 21.8/ hr, desaturation to 62%, CPAP titrated to 13. A CPAP titration study was done at [facility] 05/04/2017 for Dr. Mayford Knife.  BiPAP was required for adequate control, titrated to 29/20 with minimal saturation 90% at that pressure on room air. Body weight for that study 295 pounds Unemployed- self pay Epworth score 23 Complicating medical problems include chronic diastolic CHF, CAD status post multiple PCI's, GERD, hypothyroid, DM 2 with peripheral neuropathy, morbid obesity Weight today 301 pounds Current machine about 50 years old but working well. He thinks pressure is 16 0r 18 cwp. He couldn't afford the BIPAP machine offered after his last titration study. Admits some EDS. Has filed for disability and has an attorney for this.  Admits DOE, but stable with no cough or wheeze and he says he is not aware of lung disease.  12/01/2018- 65 yoM former smoker followed for OSA, complicated by chronic diastolic CHF, CAD status post multiple PCI's, GERD, hypothyroid, DM 2 with peripheral neuropathy, morbid obesity Quit smoking 21 months ago- hoped his breathing would get better.  CPAP 61- old machine Says he can't afford DME or replacement machine/ BIPAP. Does a little on-line shopping. Discussed on-line access to supplies, etc.  -----OSA on CPAP; DME: [facility]; uses CPAP nightly, no issues w/ pressure settings or mask Denied for  Disability. Has attorney. Arrival sat on room air 97%. Walk test on room air-  Min sat 93%, Max HR 94/ min.   ROS-see HPI   + = positive Constitutional:    weight loss, night sweats, fevers, chills, +fatigue, lassitude. HEENT:    headaches, difficulty swallowing, tooth/dental problems, sore throat,       sneezing, itching, ear ache, nasal congestion, post nasal drip, snoring CV:    chest pain, orthopnea, PND, swelling in lower extremities, anasarca,                                  dizziness, palpitations Resp:   +shortness of breath with exertion or at rest.                productive cough,   non-productive cough, coughing up of blood.              change in color of mucus.  wheezing.   Skin:    rash or lesions. GI:  No-   heartburn, indigestion, abdominal pain, nausea, vomiting, diarrhea,                 change in bowel habits, loss of appetite GU: dysuria, change in color of urine, no urgency or frequency.   flank pain. MS:   joint pain, stiffness, decreased range of motion, back pain. Neuro-     nothing unusual Psych:  change in mood or affect.  depression or anxiety.   memory loss.  OBJ- Physical Exam General- Alert, Oriented, Affect-appropriate, Distress- none acute, + morbidly obese Skin- rash-none, lesions- none, excoriation- none Lymphadenopathy- none Head- atraumatic  Eyes- Gross vision intact, PERRLA, conjunctivae and secretions clear            Ears- Hearing, canals-normal            Nose- Clear, no-Septal dev, mucus, polyps, erosion, perforation             Throat- Mallampati IV , mucosa clear , drainage- none, tonsils- atrophic Neck- flexible , trachea midline, no stridor , thyroid nl, carotid no bruit Chest - symmetrical excursion , unlabored           Heart/CV- RRR , no murmur , no gallop  , no rub, nl s1 s2                           - JVD- none , edema+ 1, stasis changes- none, varices- none           Lung- clear to P&A, wheeze- none, cough- none ,  dullness-none, rub- none           Chest wall-  Abd-  Br/ Gen/ Rectal- Not done, not indicated Extrem- cyanosis- none, clubbing, none, atrophy- none, strength- nl Neuro- grossly intact to observation

## 2018-12-01 NOTE — Patient Instructions (Signed)
Apply the Bactroban ointment over the scabbed areas twice a day. Apply triamcinolone cream over blistered and itching areas once a day in the evenings. Stop wearing the compression socks for now.

## 2018-12-01 NOTE — Progress Notes (Signed)
Pt is in the office today for sores on b/l legs

## 2018-12-02 ENCOUNTER — Encounter: Payer: Self-pay | Admitting: Internal Medicine

## 2018-12-02 MED FILL — ?ROSUVASTATIN CALCIUM 40MG: 40 | 90 days supply | Qty: 90 | Fill #0

## 2018-12-02 MED FILL — ?LEVOTHYROXINE 200 MCG TAB: 200 MCG | 30 days supply | Qty: 30 | Fill #0

## 2018-12-04 DIAGNOSIS — R0609 Other forms of dyspnea: Secondary | ICD-10-CM | POA: Insufficient documentation

## 2018-12-04 NOTE — Assessment & Plan Note (Signed)
He benefits from CPAP and is compliant. He can't afford replacement machine, though sleep study suggested better control with BIPAP. No download available. Plan- Discussed on-line access to cheaper supplies, continue CPAP 18

## 2018-12-04 NOTE — Assessment & Plan Note (Signed)
Did not qualify for supplemental O2 on walk test today. Obesity/ deconditioning with probable OHS, and known cardiac disease assumed to contribute significantly to dyspnea on exertion. Looking at him, gainful employment seems unlikely until he can achieve meaningful weight loss.

## 2018-12-07 ENCOUNTER — Encounter: Payer: Self-pay | Admitting: Internal Medicine

## 2018-12-08 ENCOUNTER — Other Ambulatory Visit: Payer: Self-pay

## 2018-12-08 MED ORDER — METOPROLOL TARTRATE 50 MG PO TABS: 50.0000 mg | ORAL_TABLET | Freq: Two times a day (BID) | ORAL | 3 refills | Status: DC

## 2018-12-08 MED ORDER — TRUEPLUS LANCETS 28G MISC: 12 refills | Status: AC

## 2018-12-08 MED FILL — TRUEplus LANCETS 28G MISC: 30 days supply | Qty: 100 | Fill #0

## 2018-12-08 MED FILL — ?METOPROLOL 50 MG TABLET: 50 | 30 days supply | Qty: 60 | Fill #0

## 2018-12-08 MED FILL — $LANTUS SOLOSTAR 100 UNITS/: 100 | 88 days supply | Qty: 120 | Fill #0

## 2019-01-15 ENCOUNTER — Encounter: Payer: Self-pay | Admitting: Internal Medicine

## 2019-01-16 ENCOUNTER — Encounter: Payer: Self-pay | Admitting: Internal Medicine

## 2019-02-16 ENCOUNTER — Other Ambulatory Visit: Payer: Self-pay | Admitting: Physician Assistant

## 2019-02-16 ENCOUNTER — Ambulatory Visit: Payer: Self-pay | Attending: Internal Medicine | Admitting: Internal Medicine

## 2019-02-16 ENCOUNTER — Encounter: Payer: Self-pay | Admitting: Internal Medicine

## 2019-02-16 ENCOUNTER — Other Ambulatory Visit: Payer: Self-pay

## 2019-02-16 VITALS — Wt 310.0 lb

## 2019-02-16 DIAGNOSIS — E039 Hypothyroidism, unspecified: Secondary | ICD-10-CM

## 2019-02-16 DIAGNOSIS — Z9989 Dependence on other enabling machines and devices: Secondary | ICD-10-CM

## 2019-02-16 DIAGNOSIS — G4733 Obstructive sleep apnea (adult) (pediatric): Secondary | ICD-10-CM

## 2019-02-16 DIAGNOSIS — I10 Essential (primary) hypertension: Secondary | ICD-10-CM

## 2019-02-16 DIAGNOSIS — I5032 Chronic diastolic (congestive) heart failure: Secondary | ICD-10-CM

## 2019-02-16 DIAGNOSIS — M160 Bilateral primary osteoarthritis of hip: Secondary | ICD-10-CM

## 2019-02-16 DIAGNOSIS — IMO0002 Reserved for concepts with insufficient information to code with codable children: Secondary | ICD-10-CM

## 2019-02-16 DIAGNOSIS — E1165 Type 2 diabetes mellitus with hyperglycemia: Secondary | ICD-10-CM

## 2019-02-16 DIAGNOSIS — E1142 Type 2 diabetes mellitus with diabetic polyneuropathy: Secondary | ICD-10-CM

## 2019-02-16 DIAGNOSIS — I25118 Atherosclerotic heart disease of native coronary artery with other forms of angina pectoris: Secondary | ICD-10-CM

## 2019-02-16 MED ORDER — LANTUS SOLOSTAR 100 UNIT/ML ~~LOC~~ SOPN: 72.0000 [IU] | PEN_INJECTOR | Freq: Two times a day (BID) | SUBCUTANEOUS | 11 refills | Status: DC

## 2019-02-16 MED ORDER — PANTOPRAZOLE SODIUM 40 MG PO TBEC: 40.0000 mg | DELAYED_RELEASE_TABLET | Freq: Every day | ORAL | 3 refills | Status: DC

## 2019-02-16 MED FILL — ?FENOFIBRATE 145MG TABLET: 145 | 30 days supply | Qty: 30 | Fill #0

## 2019-02-16 MED FILL — ?ROSUVASTATIN CALCIUM 40MG: 40 | 30 days supply | Qty: 30 | Fill #1

## 2019-02-16 MED FILL — METFORMIN HCL ER 500 MG TB2: 500 | 30 days supply | Qty: 120 | Fill #6

## 2019-02-16 MED FILL — TRUEplus LANCETS 28G MISC: 30 days supply | Qty: 100 | Fill #1

## 2019-02-16 MED FILL — ?LEVOTHYROXINE 200 MCG TAB: 200 MCG | 30 days supply | Qty: 30 | Fill #1

## 2019-02-16 MED FILL — ?METOPROLOL 50 MG TABLET: 50 | 30 days supply | Qty: 60 | Fill #1

## 2019-02-16 MED FILL — TRUE METRIX TEST STRIP: 25 days supply | Qty: 100 | Fill #5

## 2019-02-16 MED FILL — ?PANTOPRAZOLE SODI DR 40MGT: 40 | 30 days supply | Qty: 30 | Fill #0

## 2019-02-16 MED FILL — SILDENAFIL CITRATE 50 MG TA: 50 | 30 days supply | Qty: 10 | Fill #0

## 2019-02-16 MED FILL — CLOPIDOGREL 75 MG TABLET: 75 | 30 days supply | Qty: 30 | Fill #1

## 2019-02-16 NOTE — Progress Notes (Signed)
Pt states his blood sugar this morning was 242

## 2019-02-16 NOTE — Progress Notes (Signed)
Virtual Visit via Telephone Note Due to current restrictions/limitations of in-office visits due to the COVID-19 pandemic, this scheduled clinical appointment was converted to a telehealth visit  I connected with Marc Walker on 02/16/19 at 10:41 a.m by telephone and verified that I am speaking with the correct person using two identifiers. I am in my office.  The patient is at home.  Only the patient and myself participated in this encounter.  I discussed the limitations, risks, security and privacy concerns of performing an evaluation and management service by telephone and the availability of in person appointments. I also discussed with the patient that there may be a patient responsible charge related to this service. The patient expressed understanding and agreed to proceed.   History of Present Illness: Pt withhistory of CAD (8stents), DM type II, HL, HTN, obesity, OSA on CPAP,formertobacco dependence, BPH, hypothyroid.  Leg sores:  Have healed up.  DM:  Checking BS before meals and bedtime.  Before BF BS 200-300; during the rest of the day BS 125-150 and bedtime about the same.  BS this a.m was 242.  Had 1-2 hypoglycemic episodes in past 2 wks.  Pt can feel it when BS low - blurred vision and mental cloudiness.   -Meds:  Compliance with Lantus 68 units BID, Humalog 34 units with meals, Metformin and jardiance "I stay hungry but I try not to overeat."  HTN/dCHF/CAD:  Check BP daily.  Gives range 120-130/50-70 Reports compliance with meds and salt limitation No swelling in legs. CP sometimes when he exerts himself like walking up 2 fights of stairs to his GF apartment.  Has not use Sl Nitro recently.  No PND, orthopnea Has a scale.  Wgh today is 310 lbs  OSA: saw Dr. Maple HudsonYoung 11/2018.  Doing okay with supplies.  May have some money in the future to buy a BiPAP machine.  Plans to get back contact with the sleep specialist to see what his settings should be once he has the funds to make  the purchase  Hypothyroid: compliant with Levothyroxine  Request rxn for cane. C/o pain in hips with ambulation x 2 yrs.  Worse lately.  No falls.   Last x-ray LT hip 2017 revealed mild arthritis changes.    Current Outpatient Medications on File Prior to Visit  Medication Sig Dispense Refill  . aspirin 81 MG tablet Take 1 tablet (81 mg total) by mouth daily. 90 tablet 3  . clopidogrel (PLAVIX) 75 MG tablet Take 1 tablet (75 mg total) by mouth daily. 90 tablet 3  . empagliflozin (JARDIANCE) 10 MG TABS tablet Take 10 mg by mouth daily. 90 tablet 3  . ezetimibe (ZETIA) 10 MG tablet Take 1 tablet (10 mg total) by mouth daily. 90 tablet 3  . famotidine (PEPCID) 40 MG tablet Take 1 tablet (40 mg total) by mouth at bedtime. 90 tablet 0  . fenofibrate (TRICOR) 145 MG tablet Take 1 tablet (145 mg total) by mouth daily. 90 tablet 3  . furosemide (LASIX) 80 MG tablet TAKE 1 TABLET 2 TIMES DAILY BY MOUTH. 180 tablet 0  . glucose blood test strip Use as instructed 100 each 12  . Insulin Glargine (LANTUS SOLOSTAR) 100 UNIT/ML Solostar Pen Inject 68 Units into the skin 2 (two) times daily. 5 pen 11  . insulin lispro (HUMALOG KWIKPEN) 100 UNIT/ML KwikPen Inject 0.38 mLs (38 Units total) into the skin 3 (three) times daily. Take with meals 15 mL 11  . Insulin Pen Needle (PEN NEEDLES)  31G X 6 MM MISC Use as directed 100 each 6  . Insulin Syringes, Disposable, U-100 0.5 ML MISC Use as directed 100 each 11  . levothyroxine (SYNTHROID) 200 MCG tablet TAKE 1 TABLET (200 MCG TOTAL) BY MOUTH DAILY. (DOSE INCREASE) 30 tablet 6  . metFORMIN (GLUCOPHAGE-XR) 500 MG 24 hr tablet Take 1 tablet (500 mg total) by mouth 2 (two) times daily. 60 tablet 11  . metoprolol tartrate (LOPRESSOR) 50 MG tablet Take 1 tablet (50 mg total) by mouth 2 (two) times daily. 180 tablet 3  . mupirocin ointment (BACTROBAN) 2 % Place 1 application into the nose 2 (two) times daily. 22 g 0  . pantoprazole (PROTONIX) 40 MG tablet Take 1 tablet  (40 mg total) by mouth daily. 90 tablet 3  . rosuvastatin (CRESTOR) 40 MG tablet Take 1 tablet (40 mg total) by mouth daily. 30 tablet 11  . sildenafil (VIAGRA) 50 MG tablet TAKE 1 TABLET (50 MG TOTAL) BY MOUTH DAILY AS NEEDED FOR ERECTILE DYSFUNCTION. 10 tablet 6  . triamcinolone cream (KENALOG) 0.1 % Apply 1 application topically at bedtime. To right lower leg 30 g 0  . TRUEplus Lancets 28G MISC Use as directed 100 each 12   No current facility-administered medications on file prior to visit.       Observations/Objective: No direct observation done as this was a telephone encounter.  Lab Results  Component Value Date   WBC 10.0 07/09/2018   HGB 13.1 07/09/2018   HCT 42.7 07/09/2018   MCV 88.8 07/09/2018   PLT 271 07/09/2018     Chemistry      Component Value Date/Time   NA 140 08/11/2018 1028   K 4.3 08/11/2018 1028   CL 98 08/11/2018 1028   CO2 25 08/11/2018 1028   BUN 25 (H) 08/11/2018 1028   CREATININE 1.10 08/11/2018 1028   CREATININE 0.70 06/15/2016 1037      Component Value Date/Time   CALCIUM 9.8 08/11/2018 1028   ALKPHOS 62 05/23/2018 0952   AST 13 05/23/2018 0952   ALT 17 05/23/2018 0952   BILITOT 0.2 05/23/2018 0952       Assessment and Plan:  1. Uncontrolled type 2 diabetes mellitus with peripheral neuropathy (HCC) Blood sugars in the mornings not at goal.  Advised to increase Lantus insulin to 72 units twice a day.  Continue to monitor blood sugars. -Follow-up with clinical pharmacist in 2 weeks for reevaluation of blood sugars.  Please give patient flu shot on that visit. Dietary counseling given. - Insulin Glargine (LANTUS SOLOSTAR) 100 UNIT/ML Solostar Pen; Inject 72 Units into the skin 2 (two) times daily.  Dispense: 5 pen; Refill: 11 - Comprehensive metabolic panel - Hemoglobin A1c; Future  2. Essential hypertension Reported blood sugars are at goal.  3. OSA on CPAP Patient plans to follow-up with Dr. Maple HudsonYoung.  For now he will continue using  the CPAP until he has the funds to purchase BiPAP  4. Chronic diastolic CHF (congestive heart failure) (HCC) Sounds compensated.  Encouraged him to continue medications and low-salt diet  5. Coronary artery disease involving native coronary artery of native heart with stable angina pectoris Continue aspirin, Plavix, Crestor, Zetia, and metoprolol  6. Acquired hypothyroidism - TSH  7. Primary osteoarthritis of both hips Weight loss strongly encouraged. Not a good candidate for NSAIDs.  Use Tylenol. He can pick up prescription for a cane when he comes in to see the clinical pharmacist. - DG Hip Unilat W OR W/O Pelvis  2-3 Views Right; Future - DG Hip Unilat W OR W/O Pelvis Min 4 Views Left; Future   Follow Up Instructions: Follow-up with me in 2 months   I discussed the assessment and treatment plan with the patient. The patient was provided an opportunity to ask questions and all were answered. The patient agreed with the plan and demonstrated an understanding of the instructions.   The patient was advised to call back or seek an in-person evaluation if the symptoms worsen or if the condition fails to improve as anticipated.  I provided 23 minutes of non-face-to-face time during this encounter.   Karle Plumber, MD

## 2019-02-16 NOTE — Telephone Encounter (Signed)
Outpatient Medication Detail   Disp Refills Start End   pantoprazole (PROTONIX) 40 MG tablet 90 tablet 3 02/16/2019    Sig - Route: Take 1 tablet (40 mg total) by mouth daily. - Oral   Sent to pharmacy as: pantoprazole (PROTONIX) 40 MG tablet   Notes to Pharmacy: CHANGE IN THERAPY ; D/C PRILOSEC   E-Prescribing Status: Receipt confirmed by pharmacy (02/16/2019 11:05 AM EDT)   [place], [place]

## 2019-02-17 ENCOUNTER — Telehealth: Payer: Self-pay | Admitting: Internal Medicine

## 2019-02-17 ENCOUNTER — Encounter: Payer: Self-pay | Admitting: Internal Medicine

## 2019-02-17 DIAGNOSIS — G8929 Other chronic pain: Secondary | ICD-10-CM

## 2019-02-17 DIAGNOSIS — M545 Low back pain, unspecified: Secondary | ICD-10-CM

## 2019-02-17 NOTE — Telephone Encounter (Signed)
-----   Message from Jackelyn Knife, [place] sent at 02/16/2019 11:04 AM EDT ----- Please contact and schedule a 2 month in person f/u(morning)

## 2019-02-17 NOTE — Telephone Encounter (Signed)
Patient states they will call back to schedule appt.

## 2019-02-20 ENCOUNTER — Other Ambulatory Visit: Payer: Self-pay | Admitting: Internal Medicine

## 2019-02-20 ENCOUNTER — Ambulatory Visit (HOSPITAL_BASED_OUTPATIENT_CLINIC_OR_DEPARTMENT_OTHER)
Admission: RE | Admit: 2019-02-20 | Discharge: 2019-02-20 | Disposition: A | Discharge status: Home / Self Care | Payer: No Typology Code available for payment source | Source: Ambulatory Visit | Attending: Internal Medicine | Admitting: Internal Medicine

## 2019-02-20 ENCOUNTER — Ambulatory Visit: Payer: No Typology Code available for payment source | Attending: Internal Medicine

## 2019-02-20 ENCOUNTER — Other Ambulatory Visit: Payer: Self-pay

## 2019-02-20 DIAGNOSIS — M160 Bilateral primary osteoarthritis of hip: Secondary | ICD-10-CM | POA: Insufficient documentation

## 2019-02-20 DIAGNOSIS — G8929 Other chronic pain: Secondary | ICD-10-CM

## 2019-02-20 DIAGNOSIS — IMO0002 Reserved for concepts with insufficient information to code with codable children: Secondary | ICD-10-CM

## 2019-02-20 DIAGNOSIS — E1142 Type 2 diabetes mellitus with diabetic polyneuropathy: Secondary | ICD-10-CM

## 2019-02-20 DIAGNOSIS — M545 Low back pain, unspecified: Secondary | ICD-10-CM

## 2019-02-21 LAB — HEMOGLOBIN A1C
Est. average glucose Bld gHb Est-mCnc: 309 mg/dL
Hgb A1c MFr Bld: 12.4 % — ABNORMAL HIGH (ref 4.8–5.6)

## 2019-02-27 ENCOUNTER — Ambulatory Visit: Payer: No Typology Code available for payment source

## 2019-03-02 ENCOUNTER — Encounter: Payer: No Typology Code available for payment source | Admitting: Pharmacist

## 2019-03-25 ENCOUNTER — Inpatient Hospital Stay (HOSPITAL_BASED_OUTPATIENT_CLINIC_OR_DEPARTMENT_OTHER)
Admission: EM | Admit: 2019-03-25 | Discharge: 2019-03-29 | DRG: 247 | Disposition: A | Discharge status: Home / Self Care | Payer: Self-pay | Attending: Cardiology | Admitting: Cardiology

## 2019-03-25 ENCOUNTER — Encounter (HOSPITAL_COMMUNITY): Payer: Self-pay | Admitting: Cardiology

## 2019-03-25 ENCOUNTER — Other Ambulatory Visit: Payer: Self-pay

## 2019-03-25 ENCOUNTER — Emergency Department (HOSPITAL_BASED_OUTPATIENT_CLINIC_OR_DEPARTMENT_OTHER): Payer: Self-pay

## 2019-03-25 DIAGNOSIS — Z23 Encounter for immunization: Secondary | ICD-10-CM

## 2019-03-25 DIAGNOSIS — R9431 Abnormal electrocardiogram [ECG] [EKG]: Secondary | ICD-10-CM | POA: Diagnosis present

## 2019-03-25 DIAGNOSIS — I110 Hypertensive heart disease with heart failure: Secondary | ICD-10-CM | POA: Diagnosis present

## 2019-03-25 DIAGNOSIS — I252 Old myocardial infarction: Secondary | ICD-10-CM

## 2019-03-25 DIAGNOSIS — E1169 Type 2 diabetes mellitus with other specified complication: Secondary | ICD-10-CM | POA: Diagnosis present

## 2019-03-25 DIAGNOSIS — E1165 Type 2 diabetes mellitus with hyperglycemia: Secondary | ICD-10-CM | POA: Diagnosis present

## 2019-03-25 DIAGNOSIS — Z881 Allergy status to other antibiotic agents status: Secondary | ICD-10-CM

## 2019-03-25 DIAGNOSIS — Z79899 Other long term (current) drug therapy: Secondary | ICD-10-CM

## 2019-03-25 DIAGNOSIS — E1142 Type 2 diabetes mellitus with diabetic polyneuropathy: Secondary | ICD-10-CM | POA: Diagnosis present

## 2019-03-25 DIAGNOSIS — I2510 Atherosclerotic heart disease of native coronary artery without angina pectoris: Secondary | ICD-10-CM

## 2019-03-25 DIAGNOSIS — Z20828 Contact with and (suspected) exposure to other viral communicable diseases: Secondary | ICD-10-CM | POA: Diagnosis present

## 2019-03-25 DIAGNOSIS — Z833 Family history of diabetes mellitus: Secondary | ICD-10-CM

## 2019-03-25 DIAGNOSIS — Z886 Allergy status to analgesic agent status: Secondary | ICD-10-CM

## 2019-03-25 DIAGNOSIS — K219 Gastro-esophageal reflux disease without esophagitis: Secondary | ICD-10-CM | POA: Diagnosis present

## 2019-03-25 DIAGNOSIS — I249 Acute ischemic heart disease, unspecified: Secondary | ICD-10-CM | POA: Diagnosis present

## 2019-03-25 DIAGNOSIS — Z794 Long term (current) use of insulin: Secondary | ICD-10-CM

## 2019-03-25 DIAGNOSIS — Z9119 Patient's noncompliance with other medical treatment and regimen: Secondary | ICD-10-CM

## 2019-03-25 DIAGNOSIS — Z91120 Patient's intentional underdosing of medication regimen due to financial hardship: Secondary | ICD-10-CM

## 2019-03-25 DIAGNOSIS — Z79890 Hormone replacement therapy (postmenopausal): Secondary | ICD-10-CM

## 2019-03-25 DIAGNOSIS — Z7902 Long term (current) use of antithrombotics/antiplatelets: Secondary | ICD-10-CM

## 2019-03-25 DIAGNOSIS — K429 Umbilical hernia without obstruction or gangrene: Secondary | ICD-10-CM | POA: Diagnosis present

## 2019-03-25 DIAGNOSIS — I214 Non-ST elevation (NSTEMI) myocardial infarction: Principal | ICD-10-CM | POA: Diagnosis present

## 2019-03-25 DIAGNOSIS — E781 Pure hyperglyceridemia: Secondary | ICD-10-CM | POA: Diagnosis present

## 2019-03-25 DIAGNOSIS — Z7982 Long term (current) use of aspirin: Secondary | ICD-10-CM

## 2019-03-25 DIAGNOSIS — G4733 Obstructive sleep apnea (adult) (pediatric): Secondary | ICD-10-CM | POA: Diagnosis present

## 2019-03-25 DIAGNOSIS — E1151 Type 2 diabetes mellitus with diabetic peripheral angiopathy without gangrene: Secondary | ICD-10-CM | POA: Diagnosis present

## 2019-03-25 DIAGNOSIS — Z955 Presence of coronary angioplasty implant and graft: Secondary | ICD-10-CM

## 2019-03-25 DIAGNOSIS — E785 Hyperlipidemia, unspecified: Secondary | ICD-10-CM | POA: Diagnosis present

## 2019-03-25 DIAGNOSIS — I5032 Chronic diastolic (congestive) heart failure: Secondary | ICD-10-CM | POA: Diagnosis present

## 2019-03-25 DIAGNOSIS — Z6841 Body Mass Index (BMI) 40.0 and over, adult: Secondary | ICD-10-CM

## 2019-03-25 DIAGNOSIS — I25110 Atherosclerotic heart disease of native coronary artery with unstable angina pectoris: Secondary | ICD-10-CM | POA: Diagnosis present

## 2019-03-25 DIAGNOSIS — Z888 Allergy status to other drugs, medicaments and biological substances status: Secondary | ICD-10-CM

## 2019-03-25 DIAGNOSIS — E039 Hypothyroidism, unspecified: Secondary | ICD-10-CM | POA: Diagnosis present

## 2019-03-25 DIAGNOSIS — Z87891 Personal history of nicotine dependence: Secondary | ICD-10-CM

## 2019-03-25 DIAGNOSIS — N5089 Other specified disorders of the male genital organs: Secondary | ICD-10-CM | POA: Diagnosis not present

## 2019-03-25 LAB — BASIC METABOLIC PANEL
Anion gap: 10 (ref 5–15)
BUN: 13 mg/dL (ref 6–20)
CO2: 23 mmol/L (ref 22–32)
Calcium: 8.4 mg/dL — ABNORMAL LOW (ref 8.9–10.3)
Chloride: 103 mmol/L (ref 98–111)
Creatinine, Ser: 0.72 mg/dL (ref 0.61–1.24)
GFR calc Af Amer: >60 mL/min (ref >60)
GFR calc non Af Amer: >60 mL/min (ref >60)
Glucose, Bld: 324 mg/dL — ABNORMAL HIGH (ref 70–99)
Potassium: 4.1 mmol/L (ref 3.5–5.1)
Sodium: 136 mmol/L (ref 135–145)

## 2019-03-25 LAB — CBC
HCT: 44.6 % (ref 39.0–52.0)
Hemoglobin: 13.6 g/dL (ref 13.0–17.0)
MCH: 26.5 pg (ref 26.0–34.0)
MCHC: 30.5 g/dL (ref 30.0–36.0)
MCV: 86.8 fL (ref 80.0–100.0)
Platelets: 234 10*3/uL (ref 150–400)
RBC: 5.14 MIL/uL (ref 4.22–5.81)
RDW: 15.9 % — ABNORMAL HIGH (ref 11.5–15.5)
WBC: 7.8 10*3/uL (ref 4.0–10.5)
nRBC: 0 % (ref 0.0–0.2)

## 2019-03-25 LAB — TROPONIN I (HIGH SENSITIVITY)
Troponin I (High Sensitivity): 164 ng/L (ref <18)
Troponin I (High Sensitivity): 554 ng/L (ref <18)
Troponin I (High Sensitivity): 4315 ng/L (ref <18)
Troponin I (High Sensitivity): 4128 ng/L (ref <18)

## 2019-03-25 LAB — SARS CORONAVIRUS 2 (TAT 6-24 HRS): SARS Coronavirus 2: NEGATIVE

## 2019-03-25 LAB — GLUCOSE, CAPILLARY
Glucose-Capillary: 203 mg/dL — ABNORMAL HIGH (ref 70–99)
Glucose-Capillary: 250 mg/dL — ABNORMAL HIGH (ref 70–99)

## 2019-03-25 LAB — HEPARIN LEVEL (UNFRACTIONATED): Heparin Unfractionated: <0.1 IU/mL — ABNORMAL LOW (ref 0.30–0.70)

## 2019-03-25 MED ORDER — HEPARIN BOLUS VIA INFUSION
4000.0000 [IU] | Freq: Once | INTRAVENOUS | Status: AC
  Administered 2019-03-25: 4000 [IU] via INTRAVENOUS

## 2019-03-25 MED ORDER — HEPARIN BOLUS VIA INFUSION
3000.0000 [IU] | Freq: Once | INTRAVENOUS | Status: AC
  Administered 2019-03-25: 3000 [IU] via INTRAVENOUS
  Filled 2019-03-25: qty 3000

## 2019-03-25 MED ORDER — ASPIRIN EC 81 MG PO TBEC
81.0000 mg | DELAYED_RELEASE_TABLET | Freq: Every day | ORAL | Status: DC
  Administered 2019-03-26: 81 mg via ORAL
  Filled 2019-03-25 (×2): qty 1

## 2019-03-25 MED ORDER — NITROGLYCERIN 0.4 MG SL SUBL: 0.4000 mg | SUBLINGUAL_TABLET | SUBLINGUAL | Status: DC | PRN

## 2019-03-25 MED ORDER — FUROSEMIDE 80 MG PO TABS
80.0000 mg | ORAL_TABLET | Freq: Two times a day (BID) | ORAL | Status: DC
  Administered 2019-03-26 (×2): 80 mg via ORAL
  Filled 2019-03-25 (×3): qty 1

## 2019-03-25 MED ORDER — ASPIRIN 81 MG PO CHEW
324.0000 mg | CHEWABLE_TABLET | Freq: Once | ORAL | Status: AC
  Administered 2019-03-25: 324 mg via ORAL
  Filled 2019-03-25: qty 4

## 2019-03-25 MED ORDER — ASPIRIN 81 MG PO TABS: 81.0000 mg | ORAL_TABLET | Freq: Every day | ORAL | Status: DC

## 2019-03-25 MED ORDER — EZETIMIBE 10 MG PO TABS
10.0000 mg | ORAL_TABLET | Freq: Every day | ORAL | Status: DC
  Administered 2019-03-26 – 2019-03-29 (×4): 10 mg via ORAL
  Filled 2019-03-25 (×4): qty 1

## 2019-03-25 MED ORDER — INSULIN ASPART 100 UNIT/ML ~~LOC~~ SOLN
0.0000 [IU] | Freq: Three times a day (TID) | SUBCUTANEOUS | Status: DC
  Administered 2019-03-26: 15 [IU] via SUBCUTANEOUS
  Administered 2019-03-26: 16:00:00 11 [IU] via SUBCUTANEOUS
  Administered 2019-03-26: 15 [IU] via SUBCUTANEOUS
  Administered 2019-03-27: 4 [IU] via SUBCUTANEOUS
  Administered 2019-03-27: 15 [IU] via SUBCUTANEOUS
  Administered 2019-03-28: 18:00:00 11 [IU] via SUBCUTANEOUS
  Administered 2019-03-28: 09:00:00 15 [IU] via SUBCUTANEOUS
  Administered 2019-03-28: 11 [IU] via SUBCUTANEOUS
  Administered 2019-03-29 (×2): 7 [IU] via SUBCUTANEOUS

## 2019-03-25 MED ORDER — METOPROLOL TARTRATE 50 MG PO TABS
50.0000 mg | ORAL_TABLET | Freq: Two times a day (BID) | ORAL | Status: DC
  Administered 2019-03-25 – 2019-03-29 (×8): 50 mg via ORAL
  Filled 2019-03-25 (×8): qty 1

## 2019-03-25 MED ORDER — NITROGLYCERIN IN D5W 200-5 MCG/ML-% IV SOLN
2.0000 ug/min | INTRAVENOUS | Status: DC
  Administered 2019-03-25: 5 ug/min via INTRAVENOUS
  Administered 2019-03-25: 22:00:00 10 ug/min via INTRAVENOUS
  Filled 2019-03-25: qty 250

## 2019-03-25 MED ORDER — SODIUM CHLORIDE 0.9 % IV SOLN
INTRAVENOUS | Status: DC | PRN
  Administered 2019-03-25: 500 mL via INTRAVENOUS

## 2019-03-25 MED ORDER — MORPHINE SULFATE (PF) 4 MG/ML IV SOLN
4.0000 mg | Freq: Once | INTRAVENOUS | Status: DC
  Filled 2019-03-25: qty 1

## 2019-03-25 MED ORDER — ACETAMINOPHEN 500 MG PO TABS
1000.0000 mg | ORAL_TABLET | Freq: Once | ORAL | Status: AC
  Administered 2019-03-25: 12:00:00 1000 mg via ORAL
  Filled 2019-03-25: qty 2

## 2019-03-25 MED ORDER — PANTOPRAZOLE SODIUM 40 MG PO TBEC
40.0000 mg | DELAYED_RELEASE_TABLET | Freq: Every day | ORAL | Status: DC
  Administered 2019-03-26 – 2019-03-29 (×4): 40 mg via ORAL
  Filled 2019-03-25: qty 1
  Filled 2019-03-25: qty 2
  Filled 2019-03-25 (×2): qty 1

## 2019-03-25 MED ORDER — LEVOTHYROXINE SODIUM 100 MCG PO TABS
200.0000 ug | ORAL_TABLET | Freq: Every day | ORAL | Status: DC
  Administered 2019-03-26 – 2019-03-29 (×4): 200 ug via ORAL
  Filled 2019-03-25 (×4): qty 2

## 2019-03-25 MED ORDER — NITROGLYCERIN 2 % TD OINT
1.0000 [in_us] | TOPICAL_OINTMENT | Freq: Once | TRANSDERMAL | Status: AC
  Administered 2019-03-25: 15:00:00 1 [in_us] via TOPICAL
  Filled 2019-03-25: qty 1

## 2019-03-25 MED ORDER — ACETAMINOPHEN 325 MG PO TABS
650.0000 mg | ORAL_TABLET | ORAL | Status: DC | PRN
  Administered 2019-03-25 – 2019-03-26 (×2): 650 mg via ORAL
  Filled 2019-03-25 (×2): qty 2

## 2019-03-25 MED ORDER — LIDOCAINE VISCOUS HCL 2 % MT SOLN
15.0000 mL | Freq: Once | OROMUCOSAL | Status: AC
  Administered 2019-03-25: 15 mL via ORAL
  Filled 2019-03-25: qty 15

## 2019-03-25 MED ORDER — ALUM & MAG HYDROXIDE-SIMETH 200-200-20 MG/5ML PO SUSP
30.0000 mL | Freq: Once | ORAL | Status: AC
  Administered 2019-03-25: 15:00:00 30 mL via ORAL
  Filled 2019-03-25: qty 30

## 2019-03-25 MED ORDER — HEPARIN (PORCINE) 25000 UT/250ML-% IV SOLN
2000.0000 [IU]/h | INTRAVENOUS | Status: DC
  Administered 2019-03-25: 14:00:00 1150 [IU]/h via INTRAVENOUS
  Administered 2019-03-26: 1850 [IU]/h via INTRAVENOUS
  Administered 2019-03-26: 1450 [IU]/h via INTRAVENOUS
  Administered 2019-03-27: 2000 [IU]/h via INTRAVENOUS
  Filled 2019-03-25 (×4): qty 250

## 2019-03-25 MED ORDER — MORPHINE SULFATE (PF) 2 MG/ML IV SOLN: 2.0000 mg | Freq: Once | INTRAVENOUS | Status: DC

## 2019-03-25 MED ORDER — ROSUVASTATIN CALCIUM 20 MG PO TABS
40.0000 mg | ORAL_TABLET | Freq: Every day | ORAL | Status: DC
  Administered 2019-03-26 – 2019-03-29 (×4): 40 mg via ORAL
  Filled 2019-03-25 (×4): qty 2

## 2019-03-25 NOTE — Progress Notes (Signed)
[facility] for heparin Indication: chest pain/ACS  Heparin Dosing Weight: 85.6 kg  Labs: Recent Labs    03/25/19 1116 03/25/19 1312 03/25/19 1940  HGB 13.6  --   --   HCT 44.6  --   --   PLT 234  --   --   HEPARINUNFRC  --   --  <0.10*  CREATININE 0.72  --   --   TROPONINIHS 164* 554* 4,315*    Estimated Creatinine Clearance: 146.8 mL/min (by C-G formula based on SCr of 0.72 mg/dL).  Assessment: 68 yom presenting with CP, elevated troponin. Pharmacy consulted to dose heparin for ACS. Pt is not on anticoagulation PTA. CBC WNL. No active bleed issues documented.  Initial heparin level is undetectable though drawn early. No issues with infusion per RN or S/Sx bleeding.  Goal of Therapy:  Heparin level 0.3-0.7 units/ml Monitor platelets by anticoagulation protocol: Yes   Plan:  -Heparin 3000 unit x1 bolus then increase infusion to 1450 units/hr -Recheck heparin level in [place], PharmD, BCPS Clinical Pharmacist [phone] Please check AMION for all [place] numbers 03/25/2019

## 2019-03-25 NOTE — ED Notes (Signed)
Patient transported to X-ray 

## 2019-03-25 NOTE — H&P (Addendum)
Cardiology Admission History and Physical:   Patient ID: Marc Walker MRN: [id number]; DOB: August 08, 1970   Admission date: 03/25/2019  Primary Care Provider: Ladell Pier, MD Primary Cardiologist: Dorris Carnes, MD  Primary Electrophysiologist:  None   Chief Complaint:  Chest pain   Patient Profile:   Marc Walker is a 48 y.o. male with extensive CAD, prior MIs and multiple PCIs/stents, DM, HTN, HLD, obesity and former smoker transferred from [facility] for NSTEMI.   History of Present Illness:   Marc Walker is a 48 y.o. male with CAD s/pprior myocardial infarction, s/p multipleprior stents placedat outside institutions, DM, HTN, HLD with hypertriglyceridemia, tobacco abuse, OSA, diastolic HF, obesity and former smoker, quit 2 years ago. He was admitted to [facility] in 3/17 with unstable angina. LHC demonstrated 80% mid RCA stenosis treated with DES, 90% proximal LAD and 70% mid LAD stenosis involving the previous stent treated with DES, 20% RPDA in-stent restenosis treated with angioplasty. The stent in OM2 was patent. Residual disease included 90% D2 stenosis and 70% mid LCx stenosis, treated medically.   He presented to [facility] earlier today w/ CC of anterior chest, bilateral arm and jaw pain. Symptoms started 4 days ago. Intermittent. Occurs at rest and worse w/ exertion. No associated dyspnea. Has not tried SL NTG at home. Initially thought it was muscle pain but symptoms keep occurring and getting worse, prompting him to go to the ED. He had been compliant w/ his meds but admits that he ran out 2 days ago. Missed yesterday and today's meds.   At [facility], initial hs troponin was abnormal at 164. 2nd troponin increased to 554.  Pt was placed on IV heparin, given 325 of ASA and transferred to [facility] for further care/ management of NSTEMI.   He is with 3/10 CP but in no distress. Pleasant and jovial.    Heart Pathway Score:     Past Medical History:  Diagnosis Date  . Chronic  diastolic CHF (congestive heart failure) ([place]) 03/09/2017   Echo 1/18: Mild LVH, EF 60-65, Gr 2 DD, mild LAE; difficult study-no obvious wall motion abnormalities with Definity  . CKD (chronic kidney disease)   . Coronary artery disease    a. s/pprior myocardial infarction, s/p multipleprior stents placedat outside institutions with last intervention 08/2015 at [facility].  . Diabetes mellitus ([place])   . Drug addiction in remission ([place])   . Erectile dysfunction 03/02/2017  . Hyperlipidemia associated with type 2 diabetes mellitus ([place])   . Hypertension   . Hypothyroidism    a. adm with profound hypothyroidism near [facility] in 04/2017.  . MI (myocardial infarction) ([place]) 07/2009   [facility] in [place], [place] for last 2, 1st one in [place], [place]; total of 6 stents, last one 02/2014   . Morbid obesity ([place]) 07/06/2016  . Noncompliance with therapeutic plan    a. thyroid med -> profound hypothyroidism 04/2017.  Marland Kitchen OSA (obstructive sleep apnea) 07/06/2016   Moderate with AHI 21.8/hr by PSG 08/2012 now on CPAP  . Prolonged QT interval 05/14/2017  . Tobacco abuse   . Wide-complex tachycardia ([place]) 05/14/2017    Past Surgical History:  Procedure Laterality Date  . APPENDECTOMY    . CARDIAC CATHETERIZATION N/A 09/16/2015   Procedure: Left Heart Cath and Coronary Angiography;  Surgeon: Lorretta Harp, MD;  Location: [facility];  Service: Cardiovascular;  Laterality: N/A;  . CARDIAC CATHETERIZATION N/A 09/16/2015   Procedure: Coronary Stent Intervention;  Surgeon: Lorretta Harp,  MD;  Location: [facility] INVASIVE CV LAB;  Service: Cardiovascular;  Laterality: N/A;  . CORONARY STENT PLACEMENT       Medications Prior to Admission: Prior to Admission medications   Medication Sig Start Date End Date Taking? Authorizing Provider  aspirin 81 MG tablet Take 1 tablet (81 mg total) by mouth daily. 11/17/16   Pete Glatter, MD  clopidogrel (PLAVIX) 75 MG tablet Take 1 tablet (75 mg total) by mouth  daily. 10/06/18   Pricilla Riffle, MD  empagliflozin (JARDIANCE) 10 MG TABS tablet Take 10 mg by mouth daily. 11/15/18   Marcine Matar, MD  ezetimibe (ZETIA) 10 MG tablet Take 1 tablet (10 mg total) by mouth daily. 04/08/18   Marcine Matar, MD  famotidine (PEPCID) 40 MG tablet Take 1 tablet (40 mg total) by mouth at bedtime. 10/21/18   [name] T, PA-C  fenofibrate (TRICOR) 145 MG tablet Take 1 tablet (145 mg total) by mouth daily. 10/06/18   Pricilla Riffle, MD  furosemide (LASIX) 80 MG tablet TAKE 1 TABLET 2 TIMES DAILY BY MOUTH. 10/06/18   Pricilla Riffle, MD  glucose blood test strip Use as instructed 05/09/18   Marcine Matar, MD  Insulin Glargine (LANTUS SOLOSTAR) 100 UNIT/ML Solostar Pen Inject 72 Units into the skin 2 (two) times daily. 02/16/19   Marcine Matar, MD  insulin lispro (HUMALOG KWIKPEN) 100 UNIT/ML KwikPen Inject 0.38 mLs (38 Units total) into the skin 3 (three) times daily. Take with meals 05/10/18   Marcine Matar, MD  Insulin Pen Needle (PEN NEEDLES) 31G X 6 MM MISC Use as directed 05/10/18   Marcine Matar, MD  Insulin Syringes, Disposable, U-100 0.5 ML MISC Use as directed 06/11/17   Marcine Matar, MD  levothyroxine (SYNTHROID) 200 MCG tablet TAKE 1 TABLET (200 MCG TOTAL) BY MOUTH DAILY. (DOSE INCREASE) 11/15/18   Marcine Matar, MD  metFORMIN (GLUCOPHAGE-XR) 500 MG 24 hr tablet Take 1 tablet (500 mg total) by mouth 2 (two) times daily. 08/11/18   Marcine Matar, MD  metoprolol tartrate (LOPRESSOR) 50 MG tablet Take 1 tablet (50 mg total) by mouth 2 (two) times daily. 12/08/18   Marcine Matar, MD  mupirocin ointment (BACTROBAN) 2 % Place 1 application into the nose 2 (two) times daily. 12/01/18   Marcine Matar, MD  pantoprazole (PROTONIX) 40 MG tablet Take 1 tablet (40 mg total) by mouth daily. 02/16/19   Marcine Matar, MD  rosuvastatin (CRESTOR) 40 MG tablet Take 1 tablet (40 mg total) by mouth daily. 11/15/18 11/15/19  [name] T,  PA-C  sildenafil (VIAGRA) 50 MG tablet TAKE 1 TABLET (50 MG TOTAL) BY MOUTH DAILY AS NEEDED FOR ERECTILE DYSFUNCTION. 11/15/18   Marcine Matar, MD  triamcinolone cream (KENALOG) 0.1 % Apply 1 application topically at bedtime. To right lower leg 12/01/18   Marcine Matar, MD  TRUEplus Lancets 28G MISC Use as directed 12/08/18   Marcine Matar, MD     Allergies:    Allergies  Allergen Reactions  . Levofloxacin Rash    Rash on back (not sun exposed)and hypersensitivity to skin on exposed skin/arm Rash on back (not sun exposed)and hypersensitivity to skin on exposed skin/arm  . Nsaids     Avoid due to CKD  . 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  Pt only wants pain medicine if absolutely necessary.  Social History:   Social History   Socioeconomic History  . Marital status: Divorced    Spouse name: Not on file  . Number of children: Not on file  . Years of education: Not on file  . Highest education level: Not on file  Occupational History  . 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 Financial resource strain: Not on file  . Food insecurity    Worry: Not on file    Inability: Not on file  . Transportation needs    Medical: Not on file    Non-medical: Not on file  Tobacco Use  . Smoking status: Former Smoker    Types: Cigarettes    Quit date: 01/31/2017    Years since quitting: 2.1  . Smokeless tobacco: Never Used  . Tobacco comment: quit 01/2017  Substance and Sexual Activity  . Alcohol use: No    Alcohol/week: 0.0 standard drinks  . Drug use: No  . Sexual activity: Not on file  Lifestyle  . Physical activity    Days per week: Not on file    Minutes per session: Not on file  . Stress: Not on file  Relationships  . Social Musicianconnections    Talks on phone: Not on file    Gets together: Not on file    Attends religious service: Not on file    Active member of club or organization: Not on file    Attends meetings of clubs or  organizations: Not on file    Relationship status: Not on file  . Intimate partner violence    Fear of current or ex partner: Not on file    Emotionally abused: Not on file    Physically abused: Not on file    Forced sexual activity: Not on file  Other Topics Concern  . Not on file  Social History Narrative   Adopted. Very little info on biological parents, ?diabetes.   Works in [employer] - [employer]    Family History:   The patient's family history includes Diabetes in his maternal grandmother. He was adopted.    ROS:  Please see the history of present illness.  All other ROS reviewed and negative.     Physical Exam/Data:   Vitals:   03/25/19 1434 03/25/19 1500 03/25/19 1512 03/25/19 1626  BP: (!) 135/93 (!) 117/104 (!) 123/92 (!) 147/105  Pulse: 82 80 79 78  Resp: 18 18 18 17   Temp:    98.3 F (36.8 C)  TempSrc:    Oral  SpO2: 99% 96% 96% 98%  Weight:    (!) 138.3 kg  Height:    5' 5.5" (1.664 m)    Intake/Output Summary (Last 24 hours) at 03/25/2019 1641 Last data filed at 03/25/2019 1618 Gross per 24 hour  Intake 39.55 ml  Output 750 ml  Net -710.45 ml   Last 3 Weights 03/25/2019 03/25/2019 02/16/2019  Weight (lbs) 304 lb 14.4 oz 300 lb 310 lb  Weight (kg) 138.302 kg 136.079 kg 140.615 kg     Body mass index is 49.97 kg/m.  General:  Pleasant obese WM  in no acute distress HEENT: normal Lymph: no adenopathy Neck: no JVD Endocrine:  No thryomegaly Vascular: No carotid bruits; FA pulses 2+ bilaterally without bruits  Cardiac:  normal S1, S2; RRR; no murmur  Lungs:  clear to auscultation bilaterally, no wheezing, rhonchi or rales  Abd: obese abdomen, distended, nontender, no hepatomegaly  Ext: 2+ LEE, L>R  Musculoskeletal:  No deformities,  BUE and BLE strength normal and equal Skin: warm and dry  Neuro:  CNs 2-12 intact, no focal abnormalities noted Psych:  Normal affect    EKG:  The ECG that was done 03/25/19 was personally reviewed and demonstrates SR 97  bpm. No ischemic abnormalities   Relevant CV Studies: Last cardiac cath 08/2015 Procedures  Coronary Stent Intervention  Left Heart Cath and Coronary Angiography  Conclusion   Mid Cx lesion, 70% stenosed.  Prox LAD to Mid LAD lesion, 90% stenosed.  2nd Diag lesion, 90% stenosed.  Mid RCA to Dist RCA lesion, 20% stenosed. The lesion was previously treated with a stent (unknown type).  Ost RPDA to RPDA lesion, 20% stenosed. Post intervention, there is a 20% residual stenosis. The lesion was previously treated with a stent (unknown type).  Mid RCA lesion, 80% stenosed. Post intervention, there is a 0% residual stenosis.  Mid LAD to Dist LAD lesion, 70% stenosed. Post intervention, there is a 0% residual stenosis. The lesion was previously treated with a stent (unknown type).    Laboratory Data:  High Sensitivity Troponin:   Recent Labs  Lab 03/25/19 1116 03/25/19 1312  TROPONINIHS 164* 554*      Chemistry Recent Labs  Lab 03/25/19 1116  NA 136  K 4.1  CL 103  CO2 23  GLUCOSE 324*  BUN 13  CREATININE 0.72  CALCIUM 8.4*  GFRNONAA >60  GFRAA >60  ANIONGAP 10    No results for input(s): PROT, ALBUMIN, AST, ALT, ALKPHOS, BILITOT in the last 168 hours. Hematology Recent Labs  Lab 03/25/19 1116  WBC 7.8  RBC 5.14  HGB 13.6  HCT 44.6  MCV 86.8  MCH 26.5  MCHC 30.5  RDW 15.9*  PLT 234   BNPNo results for input(s): BNP, PROBNP in the last 168 hours.  DDimer No results for input(s): DDIMER in the last 168 hours.   Radiology/Studies:  Dg Chest 2 View  Result Date: 03/25/2019 CLINICAL DATA:  Chest pain EXAM: CHEST - 2 VIEW COMPARISON:  12/08/2018 FINDINGS: Cardiomegaly. Both lungs are clear. Unchanged pleural thickening about the lateral left lower lung. The visualized skeletal structures are unremarkable. IMPRESSION: Cardiomegaly without acute abnormality of the lungs. Electronically Signed   By: Lauralyn Primes M.D.   On: 03/25/2019 12:12    Assessment and  Plan:   Marc Walker is a 48 y.o. male with extensive CAD, prior MIs and multiple PCIs/stents, DM, HTN, HLD, obesity and former smoker transferred from [facility] for NSTEMI.   1. NSTEMI: known CAD s/p LHC in 2017 that demonstrated 80% mid RCA stenosis treated with DES, 90% proximal LAD and 70% mid LAD stenosis involving the previous stent treated with DES, 20% RPDA in-stent restenosis treated with angioplasty. The stent in OM2 was patent. Residual disease included 90% D2 stenosis and 70% mid LCx stenosis, treated medically. Presented to ED 9/26 w/ symptoms c/w [place] and ruled in for NSTEMI. HS troponin 164>>554.  Currently w/ 3/10 active CP. BP 147/105  Start IV nitro  Continue IV heparin   Plan LHC +/- PCI on Monday  Resume home BP meds (ran out 2 days ago)  ASA 81 mg daily. Hold Plavix incase he may need CABG  Continue Metoprolol 50 mg daily   Treat w/ High intensity statin, Crestor 40 mg nightly  Check FLP in the AM  Hold Metformin for cath. SSI for DM  Monitor on tele     For questions or updates, please contact [facility] Please consult [email or web address]  for contact info under        Signed, Robbie Lis, PA-C  03/25/2019 4:41 PM   History and all data above reviewed.  Patient examined.  I agree with the findings as above.  Patient presents with chest pain as described above.  Unfortunately he tends to run out of his medications at the end of every 30 days.  Finances is an issue.  He also has prescriptions that he has to pick up at the pharmacy.  He reports having chest and jaw discomfort today.  The jaw discomfort was on like his previous angina.  The discomfort that he was having was somewhat around his back and in the middle of his back and has had this before.  Sounds like he is very sedentary.  He is very limited by joint pains.  He is comfortable now.  Is not having any new shortness of breath, PND or orthopnea.  The patient exam reveals COR:RRR  ,  Lungs: Clear  ,   Abd: Obese, positive bowel sounds normal frequency in pitch, large umbilical hernia, Ext mild lower extremity edema..  All available labs, radiology testing, previous records reviewed. Agree with documented assessment and plan.   Unstable angina: The patient's had unstable angina and enzymes consistent with a non-Q wave myocardial infarction.  A significant ongoing risk factors are poorly treated.  Cardiac catheterization is indicated given his high risk.  He and I had a long discussion about this and he would agree to proceed. Khaleem Ariele Vidrio  5:42 PM  03/25/2019

## 2019-03-25 NOTE — Progress Notes (Signed)
[place] for heparin Indication: chest pain/ACS  Heparin Dosing Weight: 85.6 kg  Labs: Recent Labs    03/25/19 1116  HGB 13.6  HCT 44.6  PLT 234  CREATININE 0.72  TROPONINIHS 164*    Estimated Creatinine Clearance: 134.6 mL/min (by C-G formula based on SCr of 0.72 mg/dL).  Assessment: 36 yom presenting with CP, elevated troponin. Pharmacy consulted to dose heparin for ACS. Pt is not on anticoagulation PTA. CBC WNL. No active bleed issues documented.  Goal of Therapy:  Heparin level 0.3-0.7 units/ml Monitor platelets by anticoagulation protocol: Yes   Plan:  Heparin 4000 unit bolus Start heparin at 1150 units/h 6h heparin level Daily heparin level/CBC Monitor s/sx bleeding   Elicia Lamp, PharmD, BCPS Clinical Pharmacist 03/25/2019 10:11 AM

## 2019-03-25 NOTE — ED Notes (Signed)
Troponin 164, results given to ED MD

## 2019-03-25 NOTE — ED Provider Notes (Signed)
[facility] EMERGENCY DEPARTMENT Provider Note   CSN: [id number] Arrival date & time: 03/25/19  1057     History   Chief Complaint Chief Complaint  Patient presents with  . Chest Pain    HPI Marc Walker is a 48 y.o. male.     Patient is a 48 year old male with past medical history of coronary artery disease with multiple stents placed at various facilities, most recently 2-1/2 years ago at [facility].  He presents today for evaluation of discomfort in his left shoulder left jaw, and left neck.  This is been present for the past several days, however worsened this morning.  He denies any nausea, shortness of breath, diaphoresis.  He denies any exertional symptoms.  Patient's cardiologist is Dr. Tenny Craw.    The history is provided by the patient.  Chest Pain Pain severity:  Moderate Duration:  3 days Timing:  Constant Progression:  Worsening Chronicity:  New Relieved by:  Nothing Worsened by:  Nothing Ineffective treatments:  None tried   Past Medical History:  Diagnosis Date  . Chronic diastolic CHF (congestive heart failure) (HCC) 03/09/2017   Echo 1/18: Mild LVH, EF 60-65, Gr 2 DD, mild LAE; difficult study-no obvious wall motion abnormalities with Definity  . CKD (chronic kidney disease)   . Coronary artery disease    a. s/pprior myocardial infarction, s/p multipleprior stents placedat outside institutions with last intervention 08/2015 at [facility].  . Diabetes mellitus (HCC)   . Drug addiction in remission (HCC)   . Erectile dysfunction 03/02/2017  . Heartburn   . Hyperlipidemia associated with type 2 diabetes mellitus (HCC)   . Hypertension   . Hypothyroidism    a. adm with profound hypothyroidism near [facility] in 04/2017.  . MI (myocardial infarction) (HCC) 07/2009   [facility] in [place], [place] for last 2, 1st one in [place], [place]; total of 6 stents, last one 02/2014   . Morbid obesity (HCC) 07/06/2016  . Noncompliance with therapeutic plan    a.  thyroid med -> profound hypothyroidism 04/2017.  Marland Kitchen OSA (obstructive sleep apnea) 07/06/2016   Moderate with AHI 21.8/hr by PSG 08/2012 now on CPAP  . Prolonged QT interval 05/14/2017  . Tobacco abuse   . Wide-complex tachycardia (HCC) 05/14/2017    Patient Active Problem List   Diagnosis Date Noted  . Primary osteoarthritis of both hips 02/16/2019  . Dyspnea on exertion 12/04/2018  . CAD S/P multiple PCI's 08/09/2017  . Prolonged QT interval 05/14/2017  . Wide-complex tachycardia (HCC) 05/14/2017  . GERD (gastroesophageal reflux disease) 05/13/2017  . Hypothyroidism   . Bilateral carpal tunnel syndrome 04/08/2017  . Chronic diastolic CHF (congestive heart failure) (HCC) 03/09/2017  . Erectile dysfunction 03/02/2017  . OSA on CPAP 07/06/2016  . Morbid obesity (HCC) 07/06/2016  . DM type 2 with diabetic peripheral neuropathy (HCC) 12/16/2015  . CAD (coronary artery disease) 09/30/2015  . Essential hypertension 09/16/2015  . Hyperlipidemia     Past Surgical History:  Procedure Laterality Date  . APPENDECTOMY    . CARDIAC CATHETERIZATION N/A 09/16/2015   Procedure: Left Heart Cath and Coronary Angiography;  Surgeon: Runell Gess, MD;  Location: [facility] INVASIVE CV LAB;  Service: Cardiovascular;  Laterality: N/A;  . CARDIAC CATHETERIZATION N/A 09/16/2015   Procedure: Coronary Stent Intervention;  Surgeon: Runell Gess, MD;  Location: [facility] INVASIVE CV LAB;  Service: Cardiovascular;  Laterality: N/A;  . CORONARY STENT PLACEMENT          Home Medications  Prior to Admission medications   Medication Sig Start Date End Date Taking? Authorizing Provider  aspirin 81 MG tablet Take 1 tablet (81 mg total) by mouth daily. 11/17/16   Pete GlatterLangeland, Dawn T, MD  clopidogrel (PLAVIX) 75 MG tablet Take 1 tablet (75 mg total) by mouth daily. 10/06/18   Pricilla Riffleoss, Paula V, MD  empagliflozin (JARDIANCE) 10 MG TABS tablet Take 10 mg by mouth daily. 11/15/18   [name], [name], MD  ezetimibe (ZETIA) 10 MG  tablet Take 1 tablet (10 mg total) by mouth daily. 04/08/18   [name], [name], MD  famotidine (PEPCID) 40 MG tablet Take 1 tablet (40 mg total) by mouth at bedtime. 10/21/18   Tereso NewcomerWeaver, Scott T, PA-C  fenofibrate (TRICOR) 145 MG tablet Take 1 tablet (145 mg total) by mouth daily. 10/06/18   Pricilla Riffleoss, Paula V, MD  furosemide (LASIX) 80 MG tablet TAKE 1 TABLET 2 TIMES DAILY BY MOUTH. 10/06/18   Pricilla Riffleoss, Paula V, MD  glucose blood test strip Use as instructed 05/09/18   [name], [name], MD  Insulin Glargine (LANTUS SOLOSTAR) 100 UNIT/ML Solostar Pen Inject 72 Units into the skin 2 (two) times daily. 02/16/19   [name], [name], MD  insulin lispro (HUMALOG KWIKPEN) 100 UNIT/ML KwikPen Inject 0.38 mLs (38 Units total) into the skin 3 (three) times daily. Take with meals 05/10/18   [name], [name], MD  Insulin Pen Needle (PEN NEEDLES) 31G X 6 MM MISC Use as directed 05/10/18   [name], [name], MD  Insulin Syringes, Disposable, U-100 0.5 ML MISC Use as directed 06/11/17   [name], [name], MD  levothyroxine (SYNTHROID) 200 MCG tablet TAKE 1 TABLET (200 MCG TOTAL) BY MOUTH DAILY. (DOSE INCREASE) 11/15/18   [name], [name], MD  metFORMIN (GLUCOPHAGE-XR) 500 MG 24 hr tablet Take 1 tablet (500 mg total) by mouth 2 (two) times daily. 08/11/18   [name], [name], MD  metoprolol tartrate (LOPRESSOR) 50 MG tablet Take 1 tablet (50 mg total) by mouth 2 (two) times daily. 12/08/18   [name], [name], MD  mupirocin ointment (BACTROBAN) 2 % Place 1 application into the nose 2 (two) times daily. 12/01/18   [name], [name], MD  pantoprazole (PROTONIX) 40 MG tablet Take 1 tablet (40 mg total) by mouth daily. 02/16/19   [name], [name], MD  rosuvastatin (CRESTOR) 40 MG tablet Take 1 tablet (40 mg total) by mouth daily. 11/15/18 11/15/19  Tereso NewcomerWeaver, Scott T, PA-C  sildenafil (VIAGRA) 50 MG tablet TAKE 1 TABLET (50 MG TOTAL) BY MOUTH DAILY AS NEEDED FOR ERECTILE DYSFUNCTION. 11/15/18   [name], [name], MD  triamcinolone  cream (KENALOG) 0.1 % Apply 1 application topically at bedtime. To right lower leg 12/01/18   [name], [name], MD  TRUEplus Lancets 28G MISC Use as directed 12/08/18   [name], [name], MD    Family History Family History  Adopted: Yes  Problem Relation Age of Onset  . Diabetes Maternal Grandmother     Social History Social History   Tobacco Use  . Smoking status: Former Smoker    Types: Cigarettes    Quit date: 01/31/2017    Years since quitting: 2.1  . Smokeless tobacco: Never Used  . Tobacco comment: quit 01/2017  Substance Use Topics  . Alcohol use: No    Alcohol/week: 0.0 standard drinks  . Drug use: No     Allergies   Levofloxacin, Nsaids, and Other   Review of Systems Review of Systems  Cardiovascular: Positive  for chest pain.  All other systems reviewed and are negative.    Physical Exam Updated Vital Signs BP (!) 148/86   Pulse 93   Temp 98.1 F (36.7 C) (Oral)   Resp 15   Ht 5' 0.5" (1.537 m)   Wt 136.1 kg   SpO2 100%   BMI 57.63 kg/m   Physical Exam Vitals signs and nursing note reviewed.  Constitutional:      General: He is not in acute distress.    Appearance: He is well-developed. He is not diaphoretic.  HENT:     Head: Normocephalic and atraumatic.  Neck:     Musculoskeletal: Normal range of motion and neck supple.  Cardiovascular:     Rate and Rhythm: Normal rate and regular rhythm.     Heart sounds: No murmur. No friction rub.  Pulmonary:     Effort: Pulmonary effort is normal. No respiratory distress.     Breath sounds: Normal breath sounds. No wheezing or rales.  Abdominal:     General: Bowel sounds are normal. There is no distension.     Palpations: Abdomen is soft.     Tenderness: There is no abdominal tenderness.  Musculoskeletal: Normal range of motion.     Right lower leg: He exhibits no tenderness. No edema.     Left lower leg: He exhibits no tenderness. No edema.  Skin:    General: Skin is warm and dry.   Neurological:     Mental Status: He is alert and oriented to person, place, and time.     Coordination: Coordination normal.      ED Treatments / Results  Labs (all labs ordered are listed, but only abnormal results are displayed) Labs Reviewed  CBC - Abnormal; Notable for the following components:      Result Value   RDW 15.9 (*)    All other components within normal limits  BASIC METABOLIC PANEL  TROPONIN I (HIGH SENSITIVITY)    EKG EKG Interpretation  Date/Time:  Saturday March 25 2019 11:02:49 EDT Ventricular Rate:  97 PR Interval:    QRS Duration: 109 QT Interval:  357 QTC Calculation: 454 R Axis:   90 Text Interpretation:  Sinus rhythm Borderline right axis deviation Baseline wander in lead(s) V1 V2 V5 V6 Confirmed by Geoffery Lyons ([id number]) on 03/25/2019 11:37:58 AM   Radiology No results found.  Procedures Procedures (including critical care time)  Medications Ordered in ED Medications  morphine 4 MG/ML injection 4 mg (has no administration in time range)     Initial Impression / Assessment and Plan / ED Course  I have reviewed the triage vital signs and the nursing notes.  Pertinent labs & imaging results that were available during my care of the patient were reviewed by me and considered in my medical decision making (see chart for details).  Patient presents here with complaints of chest discomfort that has been worsening over the past few days.  He has extensive cardiac history with 7 stents in place.  Patient's EKG is unchanged, however initial troponin returned at 164.  I have discussed the care with Dr. Antoine Poche from cardiology who agrees to accept the patient in transfer.  He will be started on heparin and aspirin given.  Second troponin resulted at 554.  CRITICAL CARE Performed by: Geoffery Lyons Total critical care time: 45 minutes Critical care time was exclusive of separately billable procedures and treating other patients. Critical care  was necessary to treat or prevent imminent or life-threatening  deterioration. Critical care was time spent personally by me on the following activities: development of treatment plan with patient and/or surrogate as well as nursing, discussions with consultants, evaluation of patient's response to treatment, examination of patient, obtaining history from patient or surrogate, ordering and performing treatments and interventions, ordering and review of laboratory studies, ordering and review of radiographic studies, pulse oximetry and re-evaluation of patient's condition.   Final Clinical Impressions(s) / ED Diagnoses   Final diagnoses:  None    ED Discharge Orders    None       Veryl Speak, MD 03/25/19 1433

## 2019-03-25 NOTE — ED Triage Notes (Signed)
Patient states that he has had pressure to his shoulders x 2 -4 days - he reports today that he started to have pressure to his chest and neck  Like someone is sitting on his chest and neck

## 2019-03-25 NOTE — ED Notes (Signed)
Pt refuses morphine, requests tylenol for pain.

## 2019-03-25 NOTE — ED Notes (Signed)
ED Provider at bedside. 

## 2019-03-25 NOTE — ED Notes (Signed)
ED TO INPATIENT HANDOFF REPORT  ED Nurse Name and Phone #: Joss. [phone]  S Name/Age/Gender Marc Walker 48 y.o. male Room/Bed: MH07/MH07  Code Status   Code Status: Prior  Home/SNF/Other [facility], E-20 Patient oriented to: self, place, time and situation Is this baseline? Yes   Triage Complete: Triage complete  Chief Complaint Chest pain  Triage Note Patient states that he has had pressure to his shoulders x 2 -4 days - he reports today that he started to have pressure to his chest and neck  Like someone is sitting on his chest and neck    Allergies Allergies  Allergen Reactions  . Levofloxacin Rash    Rash on back (not sun exposed)and hypersensitivity to skin on exposed skin/arm Rash on back (not sun exposed)and hypersensitivity to skin on exposed skin/arm  . Nsaids     Avoid due to CKD  . 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  Pt only wants pain medicine if absolutely necessary.    Level of Care/Admitting Diagnosis ED Disposition    ED Disposition Condition Comment   Admit  Hospital Area: [facility] [[id number]]  Level of Care: Telemetry Cardiac [103]  Covid Evaluation: Asymptomatic Screening Protocol (No Symptoms)  Diagnosis: ACS (acute coronary syndrome) [facility]) [[id number]]  Admitting Physician: Rollene Rotunda [phone]  Attending Physician: Rollene Rotunda [phone]  Estimated length of stay: past midnight tomorrow  Certification:: I certify this patient will need inpatient services for at least 2 midnights  PT Class (Do Not Modify): Inpatient [101]  PT Acc Code (Do Not Modify): Private [1]       B Medical/Surgery History Past Medical History:  Diagnosis Date  . Chronic diastolic CHF (congestive heart failure) (HCC) 03/09/2017   Echo 1/18: Mild LVH, EF 60-65, Gr 2 DD, mild LAE; difficult study-no obvious wall motion abnormalities with Definity  . CKD (chronic kidney disease)   . Coronary artery disease    a. s/pprior  myocardial infarction, s/p multipleprior stents placedat outside institutions with last intervention 08/2015 at [facility].  . Diabetes mellitus (HCC)   . Drug addiction in remission (HCC)   . Erectile dysfunction 03/02/2017  . Heartburn   . Hyperlipidemia associated with type 2 diabetes mellitus (HCC)   . Hypertension   . Hypothyroidism    a. adm with profound hypothyroidism near [facility] in 04/2017.  . MI (myocardial infarction) (HCC) 07/2009   [facility] in [place], [place] for last 2, 1st one in [place], [place]; total of 6 stents, last one 02/2014   . Morbid obesity (HCC) 07/06/2016  . Noncompliance with therapeutic plan    a. thyroid med -> profound hypothyroidism 04/2017.  Marland Kitchen OSA (obstructive sleep apnea) 07/06/2016   Moderate with AHI 21.8/hr by PSG 08/2012 now on CPAP  . Prolonged QT interval 05/14/2017  . Tobacco abuse   . Wide-complex tachycardia (HCC) 05/14/2017   Past Surgical History:  Procedure Laterality Date  . APPENDECTOMY    . CARDIAC CATHETERIZATION N/A 09/16/2015   Procedure: Left Heart Cath and Coronary Angiography;  Surgeon: Runell Gess, MD;  Location: [facility] INVASIVE CV LAB;  Service: Cardiovascular;  Laterality: N/A;  . CARDIAC CATHETERIZATION N/A 09/16/2015   Procedure: Coronary Stent Intervention;  Surgeon: Runell Gess, MD;  Location: [facility] INVASIVE CV LAB;  Service: Cardiovascular;  Laterality: N/A;  . CORONARY STENT PLACEMENT       A IV Location/Drains/Wounds Patient Lines/Drains/Airways Status   Active Line/Drains/Airways  Name:   Placement date:   Placement time:   Site:   Days:   Peripheral IV 03/25/19 Left Antecubital   03/25/19    1115    Antecubital   less than 1   Peripheral IV 03/25/19 Right Antecubital   03/25/19    1457    Antecubital   less than 1          Intake/Output Last 24 hours No intake or output data in the 24 hours ending 03/25/19 1508  Labs/Imaging Results for orders placed or performed during the hospital encounter of 03/25/19  (from the past 48 hour(s))  Basic metabolic panel     Status: Abnormal   Collection Time: 03/25/19 11:16 AM  Result Value Ref Range   Sodium 136 135 - 145 mmol/L   Potassium 4.1 3.5 - 5.1 mmol/L   Chloride 103 98 - 111 mmol/L   CO2 23 22 - 32 mmol/L   Glucose, Bld 324 (H) 70 - 99 mg/dL   BUN 13 6 - 20 mg/dL   Creatinine, Ser 0.72 0.61 - 1.24 mg/dL   Calcium 8.4 (L) 8.9 - 10.3 mg/dL   GFR calc non Af Amer >60 >60 mL/min   GFR calc Af Amer >60 >60 mL/min   Anion gap 10 5 - 15    Comment: Performed at [facility], [address] [place], [place] [address]  CBC     Status: Abnormal   Collection Time: 03/25/19 11:16 AM  Result Value Ref Range   WBC 7.8 4.0 - 10.5 K/uL   RBC 5.14 4.22 - 5.81 MIL/uL   Hemoglobin 13.6 13.0 - 17.0 g/dL   HCT 44.6 39.0 - 52.0 %   MCV 86.8 80.0 - 100.0 fL   MCH 26.5 26.0 - 34.0 pg   MCHC 30.5 30.0 - 36.0 g/dL   RDW 15.9 (H) 11.5 - 15.5 %   Platelets 234 150 - 400 K/uL   nRBC 0.0 0.0 - 0.2 %    Comment: Performed at [facility], [address] [place], [place] [address]  Troponin I (High Sensitivity)     Status: Abnormal   Collection Time: 03/25/19 11:16 AM  Result Value Ref Range   Troponin I (High Sensitivity) 164 (HH) <18 ng/L    Comment: CRITICAL RESULT CALLED TO, READ BACK BY AND VERIFIED WITHJosph Macho RN [phone] PHILLIPS C (NOTE) Elevated high sensitivity troponin I (hsTnI) values and significant  changes across serial measurements may suggest ACS but many other  chronic and acute conditions are known to elevate hsTnI results.  Refer to the Links section for chest pain algorithms and additional  guidance. Performed at [facility], [address] [place], [place] [address]   Troponin I (High Sensitivity)     Status: Abnormal   Collection Time: 03/25/19  1:12 PM  Result Value Ref Range   Troponin I (High Sensitivity) 554 (HH) <18 ng/L    Comment: RBV  NURSE SOPHIE GOUGE @ 7824 ON 03/25/19,  CABELLERO,P (NOTE) Elevated high sensitivity troponin I (hsTnI) values and significant  changes across serial measurements may suggest ACS but many other  chronic and acute conditions are known to elevate hsTnI results.  Refer to the Links section for chest pain algorithms and additional  guidance. Performed at [facility], [address] [place], [place] [address]    Dg Chest 2 View  Result Date: 03/25/2019 CLINICAL DATA:  Chest pain  EXAM: CHEST - 2 VIEW COMPARISON:  12/08/2018 FINDINGS: Cardiomegaly. Both lungs are clear. Unchanged pleural thickening about the lateral left lower lung. The visualized skeletal structures are unremarkable. IMPRESSION: Cardiomegaly without acute abnormality of the lungs. Electronically Signed   By: Lauralyn PrimesAlex  Bibbey M.D.   On: 03/25/2019 12:12    Pending Labs Unresulted Labs (From admission, onward)    Start     Ordered   03/26/19 0500  Heparin level (unfractionated)  Daily,   R     03/25/19 1259   03/26/19 0500  CBC  Daily,   R     03/25/19 1259   03/25/19 1930  Heparin level (unfractionated)  Once-Timed,   STAT     03/25/19 1259   03/25/19 1349  SARS CORONAVIRUS 2 (TAT 6-24 HRS) Nasopharyngeal Nasopharyngeal Swab  (Asymptomatic/Tier 2 Patients Labs)  Once,   STAT    Question Answer Comment  Is this test for diagnosis or screening Screening   Symptomatic for COVID-19 as defined by CDC No   Hospitalized for COVID-19 No   Admitted to ICU for COVID-19 No   Previously tested for COVID-19 No   Resident in a congregate (group) care setting No   Employed in healthcare setting No      03/25/19 1348          Vitals/Pain Today's Vitals   03/25/19 1154 03/25/19 1330 03/25/19 1434 03/25/19 1435  BP:  (!) 140/56 (!) 135/93   Pulse:  79 82   Resp:  16 18   Temp:      TempSrc:      SpO2:  98% 99%   Weight:      Height:      PainSc: 8    6     Isolation Precautions No active isolations  Medications Medications  heparin ADULT  infusion 100 units/mL (25000 units/24050mL sodium chloride 0.45%) (1,150 Units/hr Intravenous New Bag/Given 03/25/19 1424)  0.9 %  sodium chloride infusion (500 mLs Intravenous New Bag/Given 03/25/19 1428)  aspirin chewable tablet 324 mg (has no administration in time range)  nitroGLYCERIN (NITROGLYN) 2 % ointment 1 inch (has no administration in time range)  acetaminophen (TYLENOL) tablet 1,000 mg (1,000 mg Oral Given 03/25/19 1155)  heparin bolus via infusion 4,000 Units (4,000 Units Intravenous Bolus from Bag 03/25/19 1420)  alum & mag hydroxide-simeth (MAALOX/MYLANTA) 200-200-20 MG/5ML suspension 30 mL (30 mLs Oral Given 03/25/19 1431)    And  lidocaine (XYLOCAINE) 2 % viscous mouth solution 15 mL (15 mLs Oral Given 03/25/19 1431)    Mobility walks Low fall risk   Focused Assessments Cardiac Assessment Handoff:  Cardiac Rhythm: Normal sinus rhythm Lab Results  Component Value Date   CKTOTAL 619 (H) 05/15/2017   TROPONINI <0.03 07/09/2018   No results found for: DDIMER Does the Patient currently have chest pain? Yes     R Recommendations: See Admitting Provider Note  Report given to:   Additional Notes: Pt A/O, no s/s distress. Ready for transport

## 2019-03-26 ENCOUNTER — Encounter (HOSPITAL_COMMUNITY): Payer: Self-pay

## 2019-03-26 LAB — LIPID PANEL
Cholesterol: 197 mg/dL (ref 0–200)
HDL: 30 mg/dL — ABNORMAL LOW (ref >40)
LDL Cholesterol: UNDETERMINED mg/dL (ref 0–99)
Total CHOL/HDL Ratio: 6.6 RATIO
Triglycerides: 769 mg/dL — ABNORMAL HIGH (ref <150)
VLDL: UNDETERMINED mg/dL (ref 0–40)

## 2019-03-26 LAB — GLUCOSE, CAPILLARY
Glucose-Capillary: 303 mg/dL — ABNORMAL HIGH (ref 70–99)
Glucose-Capillary: 338 mg/dL — ABNORMAL HIGH (ref 70–99)
Glucose-Capillary: 287 mg/dL — ABNORMAL HIGH (ref 70–99)
Glucose-Capillary: 271 mg/dL — ABNORMAL HIGH (ref 70–99)

## 2019-03-26 LAB — HIV ANTIBODY (ROUTINE TESTING W REFLEX): HIV Screen 4th Generation wRfx: NONREACTIVE

## 2019-03-26 LAB — CBC
HCT: 39.6 % (ref 39.0–52.0)
Hemoglobin: 12.9 g/dL — ABNORMAL LOW (ref 13.0–17.0)
MCH: 27.6 pg (ref 26.0–34.0)
MCHC: 32.6 g/dL (ref 30.0–36.0)
MCV: 84.8 fL (ref 80.0–100.0)
Platelets: 224 10*3/uL (ref 150–400)
RBC: 4.67 MIL/uL (ref 4.22–5.81)
RDW: 16.2 % — ABNORMAL HIGH (ref 11.5–15.5)
WBC: 9.1 10*3/uL (ref 4.0–10.5)
nRBC: 0 % (ref 0.0–0.2)

## 2019-03-26 LAB — BASIC METABOLIC PANEL
Anion gap: 7 (ref 5–15)
BUN: 12 mg/dL (ref 6–20)
CO2: 22 mmol/L (ref 22–32)
Calcium: 8.6 mg/dL — ABNORMAL LOW (ref 8.9–10.3)
Chloride: 104 mmol/L (ref 98–111)
Creatinine, Ser: 0.71 mg/dL (ref 0.61–1.24)
GFR calc Af Amer: >60 mL/min (ref >60)
GFR calc non Af Amer: >60 mL/min (ref >60)
Glucose, Bld: 266 mg/dL — ABNORMAL HIGH (ref 70–99)
Potassium: 4.3 mmol/L (ref 3.5–5.1)
Sodium: 133 mmol/L — ABNORMAL LOW (ref 135–145)

## 2019-03-26 LAB — HEPARIN LEVEL (UNFRACTIONATED)
Heparin Unfractionated: 0.16 IU/mL — ABNORMAL LOW (ref 0.30–0.70)
Heparin Unfractionated: 0.26 IU/mL — ABNORMAL LOW (ref 0.30–0.70)
Heparin Unfractionated: 0.28 IU/mL — ABNORMAL LOW (ref 0.30–0.70)

## 2019-03-26 LAB — LDL CHOLESTEROL, DIRECT: Direct LDL: 76.9 mg/dL (ref 0–99)

## 2019-03-26 MED ORDER — SODIUM CHLORIDE 0.9 % IV SOLN: 250.0000 mL | INTRAVENOUS | Status: DC | PRN

## 2019-03-26 MED ORDER — SODIUM CHLORIDE 0.9 % IV SOLN: INTRAVENOUS | Status: DC

## 2019-03-26 MED ORDER — METFORMIN HCL ER 500 MG PO TB24: 500.0000 mg | ORAL_TABLET | Freq: Every day | ORAL | Status: DC

## 2019-03-26 MED ORDER — HEPARIN BOLUS VIA INFUSION
2000.0000 [IU] | Freq: Once | INTRAVENOUS | Status: AC
  Administered 2019-03-26: 2000 [IU] via INTRAVENOUS
  Filled 2019-03-26: qty 2000

## 2019-03-26 MED ORDER — HEPARIN BOLUS VIA INFUSION
1400.0000 [IU] | Freq: Once | INTRAVENOUS | Status: AC
  Administered 2019-03-26: 1400 [IU] via INTRAVENOUS
  Filled 2019-03-26: qty 1400

## 2019-03-26 MED ORDER — FENOFIBRATE 160 MG PO TABS
160.0000 mg | ORAL_TABLET | Freq: Every day | ORAL | Status: DC
  Administered 2019-03-26 – 2019-03-29 (×4): 160 mg via ORAL
  Filled 2019-03-26 (×3): qty 1

## 2019-03-26 MED ORDER — INFLUENZA VAC SPLIT QUAD 0.5 ML IM SUSY
0.5000 mL | PREFILLED_SYRINGE | INTRAMUSCULAR | Status: AC
  Administered 2019-03-27: 0.5 mL via INTRAMUSCULAR
  Filled 2019-03-26: qty 0.5

## 2019-03-26 MED ORDER — SODIUM CHLORIDE 0.9% FLUSH: 3.0000 mL | INTRAVENOUS | Status: DC | PRN

## 2019-03-26 MED ORDER — SODIUM CHLORIDE 0.9% FLUSH
3.0000 mL | Freq: Two times a day (BID) | INTRAVENOUS | Status: DC
  Administered 2019-03-27 – 2019-03-28 (×2): 3 mL via INTRAVENOUS

## 2019-03-26 MED ORDER — ASPIRIN 81 MG PO CHEW
81.0000 mg | CHEWABLE_TABLET | ORAL | Status: AC
  Administered 2019-03-27: 05:00:00 81 mg via ORAL
  Filled 2019-03-26: qty 1

## 2019-03-26 NOTE — Progress Notes (Signed)
ANTICOAGULATION CONSULT NOTE  Pharmacy Consult for Heparin Indication: chest pain/ACS  Heparin Dosing Weight: 96 kg  Labs: Recent Labs    03/25/19 1116 03/25/19 1312  03/25/19 1940 03/25/19 2157 03/26/19 0345 03/26/19 1240 03/26/19 1949  HGB 13.6  --   --   --   --  12.9*  --   --   HCT 44.6  --   --   --   --  39.6  --   --   PLT 234  --   --   --   --  224  --   --   HEPARINUNFRC  --   --    < > <0.10*  --  0.16* 0.26* 0.28*  CREATININE 0.72  --   --   --   --  0.71  --   --   TROPONINIHS 164* 554*  --  4,315* 4,128*  --   --   --    < > = values in this interval not displayed.    Estimated Creatinine Clearance: 147.4 mL/min (by C-G formula based on SCr of 0.71 mg/dL).  Assessment: 52 yom presenting with CP, elevated troponin. Pharmacy consulted to dose heparin for ACS with + troponins. Pt is not on anticoagulation PTA. Cath planned for tomorrow.   Heparin level remains low at 0.28, no infusion issues per RN.   Goal of Therapy:  Heparin level 0.3-0.7 units/ml Monitor platelets by anticoagulation protocol: Yes   Plan:  -Increase heparin to 2000 units/hr -Daily heparin level and CBC   Arrie Senate, PharmD, BCPS Clinical Pharmacist [phone] Please check AMION for all [facility] numbers 03/26/2019

## 2019-03-26 NOTE — Progress Notes (Signed)
Patient's black suitcase was picked up front he [place] tower and delivered to patient in his room.

## 2019-03-26 NOTE — Consult Note (Signed)
Met pt's fiancee Amanda in hall, who asked if we had communion wafers. Her finance would appreciate receiving communion if so. She said he was [place] her sorry, we did not. Stopped by to see pt, & gave him rock to hold onto as emblem of the Psalmist's cry to God our rock, & small booklet on Courage. His attitude toward his stent procedure tomorrow is good. This is his 5th time. I told him his pastor could come visit him now if he wished, though it was still only 1 visitor in his room at a time. Staff will page again if there is further need for chaplain services.   Rev. Eloise Levels Chaplain

## 2019-03-26 NOTE — Progress Notes (Addendum)
ANTICOAGULATION CONSULT NOTE  Pharmacy Consult for Heparin Indication: chest pain/ACS  Heparin Dosing Weight: 96 kg  Labs: Recent Labs    03/25/19 1116 03/25/19 1312 03/25/19 1940 03/25/19 2157 03/26/19 0345  HGB 13.6  --   --   --  12.9*  HCT 44.6  --   --   --  39.6  PLT 234  --   --   --  224  HEPARINUNFRC  --   --  <0.10*  --  0.16*  CREATININE 0.72  --   --   --  0.71  TROPONINIHS 164* 554* 4,315* 4,128*  --     Estimated Creatinine Clearance: 147.4 mL/min (by C-G formula based on SCr of 0.71 mg/dL).  Assessment: Marc Walker presenting with CP, elevated troponin. Pharmacy consulted to dose heparin for ACS with + troponins. Pt is not on anticoagulation PTA. Cath planned for tomorrow.   Heparin level after bolus and dose increase is trending up but still sub-therapeutic at 0.26. H&H is stable at 12.9/39.6, plts are wnl.    Goal of Therapy:  Heparin level 0.3-0.7 units/ml Monitor platelets by anticoagulation protocol: Yes   Plan:  - bolus heparin with 1400 units -Inc heparin drip to 1850 units/hr (~2 unit/kg increase) -Re-check heparin level in 6 hours - daily heparin level and CBC - Monitor for signs of bleeding

## 2019-03-26 NOTE — Progress Notes (Signed)
 Progress Note  Patient Name: Marc Walker Date of Encounter: 03/26/2019  Primary Cardiologist:   Paula Ross, MD   Subjective   No chest or jaw pain.  No SOB.  Inpatient Medications    Scheduled Meds: . aspirin EC  81 mg Oral Daily  . ezetimibe  10 mg Oral Daily  . furosemide  80 mg Oral BID  . [START ON 03/27/2019] influenza vac split quadrivalent PF  0.5 mL Intramuscular Tomorrow-1000  . insulin aspart  0-20 Units Subcutaneous TID WC  . levothyroxine  200 mcg Oral Q0600  . metoprolol tartrate  50 mg Oral BID  . pantoprazole  40 mg Oral Daily  . rosuvastatin  40 mg Oral Daily   Continuous Infusions: . sodium chloride 500 mL (03/25/19 1428)  . heparin 1,650 Units/hr (03/26/19 0522)  . nitroGLYCERIN 10 mcg/min (03/25/19 2200)   PRN Meds: sodium chloride, acetaminophen, nitroGLYCERIN   Vital Signs    Vitals:   03/26/19 0300 03/26/19 0400 03/26/19 0528 03/26/19 0815  BP: 140/69  140/77 (!) 154/52  Pulse: 67  73 75  Resp:   18   Temp:   98.8 F (37.1 C)   TempSrc:   Oral   SpO2:   97%   Weight:  (!) 139.2 kg    Height:        Intake/Output Summary (Last 24 hours) at 03/26/2019 1059 Last data filed at 03/26/2019 0900 Gross per 24 hour  Intake 2380.52 ml  Output 2750 ml  Net -369.48 ml   Filed Weights   03/25/19 1103 03/25/19 1626 03/26/19 0400  Weight: 136.1 kg (!) 138.3 kg (!) 139.2 kg    Telemetry    NSR rate 67, axis WNL, intervals WNL, no acute ST T wave changes - Personally Reviewed  ECG    NA - Personally Reviewed  Physical Exam   GEN: No acute distress.   Neck: No  JVD Cardiac: RRR, no murmurs, rubs, or gallops.  Respiratory: Clear  to auscultation bilaterally. GI: Soft, nontender, non-distended  MS:  Mild edema; No deformity. Neuro:  Nonfocal  Psych: Normal affect   Labs    Chemistry Recent Labs  Lab 03/25/19 1116 03/26/19 0345  NA 136 133*  K 4.1 4.3  CL 103 104  CO2 23 22  GLUCOSE 324* 266*  BUN 13 12  CREATININE 0.72  0.71  CALCIUM 8.4* 8.6*  GFRNONAA >60 >60  GFRAA >60 >60  ANIONGAP 10 7     Hematology Recent Labs  Lab 03/25/19 1116 03/26/19 0345  WBC 7.8 9.1  RBC 5.14 4.67  HGB 13.6 12.9*  HCT 44.6 39.6  MCV 86.8 84.8  MCH 26.5 27.6  MCHC 30.5 32.6  RDW 15.9* 16.2*  PLT 234 224   Lab Results  Component Value Date   HGBA1C 12.4 (H) 02/20/2019    Cardiac EnzymesNo results for input(s): TROPONINI in the last 168 hours. No results for input(s): TROPIPOC in the last 168 hours.   BNPNo results for input(s): BNP, PROBNP in the last 168 hours.   DDimer No results for input(s): DDIMER in the last 168 hours.   Radiology    Dg Chest 2 View  Result Date: 03/25/2019 CLINICAL DATA:  Chest pain EXAM: CHEST - 2 VIEW COMPARISON:  12/08/2018 FINDINGS: Cardiomegaly. Both lungs are clear. Unchanged pleural thickening about the lateral left lower lung. The visualized skeletal structures are unremarkable. IMPRESSION: Cardiomegaly without acute abnormality of the lungs. Electronically Signed   By: Alex  Bibbey M.D.     On: 03/25/2019 12:12    Cardiac Studies   Echo pending.   Patient Profile     48 y.o. male with extensive CAD, prior MIs and multiple PCIs/stents, DM, HTN, HLD, obesity and former smoker transferred from [facility] for NSTEMI.   Assessment & Plan    NSTEMI:    Elevation in trop.  On for cath tomorrow.  The patient understands that risks included but are not limited to stroke (1 in 1000), death (1 in 63), kidney failure [usually temporary] (1 in 500), bleeding (1 in 200), allergic reaction [possibly serious] (1 in 200).  The patient understands and agrees to proceed.  Continue heparin, ASA  CHRONIC DIASTOLIC HF:   Seems to be euvolemic.  No change in therapy.   DM:   A1C is 12.4.  He has been off of his meds and these have been restarted.  Meds and diet and lifestyle are all a significant issue.  Metformin is held for cath but needs to be restarted  He needs to be back on Jardiance at  discharge.  Not available on formulary here.     HTN:  BP is mildly elevated.  Needs to be on ARB/ACE inhibitor at discharge.  I did look back and some some increased creat so this would need to be followed.      PROLONGED QT:  No symptoms associated.  Avoid QT prolonging meds.  QT OK this morning  DYSLIPIDEMIA:  Trig are elevated.  Direct LDL is OK.  I restarted fenofibrate. Also on Zetia and Crestor.     For questions or updates, please contact [place] Please consult [email or web address] for contact info under Cardiology/STEMI.   Signed, Minus Breeding, MD  03/26/2019, 10:59 AM

## 2019-03-26 NOTE — H&P (View-Only) (Signed)
Progress Note  Patient Name: Marc Walker Date of Encounter: 03/26/2019  Primary Cardiologist:   Dietrich Pates, MD   Subjective   No chest or jaw pain.  No SOB.  Inpatient Medications    Scheduled Meds: . aspirin EC  81 mg Oral Daily  . ezetimibe  10 mg Oral Daily  . furosemide  80 mg Oral BID  . [START ON 03/27/2019] influenza vac split quadrivalent PF  0.5 mL Intramuscular Tomorrow-1000  . insulin aspart  0-20 Units Subcutaneous TID WC  . levothyroxine  200 mcg Oral Q0600  . metoprolol tartrate  50 mg Oral BID  . pantoprazole  40 mg Oral Daily  . rosuvastatin  40 mg Oral Daily   Continuous Infusions: . sodium chloride 500 mL (03/25/19 1428)  . heparin 1,650 Units/hr (03/26/19 0522)  . nitroGLYCERIN 10 mcg/min (03/25/19 2200)   PRN Meds: sodium chloride, acetaminophen, nitroGLYCERIN   Vital Signs    Vitals:   03/26/19 0300 03/26/19 0400 03/26/19 0528 03/26/19 0815  BP: 140/69  140/77 (!) 154/52  Pulse: 67  73 75  Resp:   18   Temp:   98.8 F (37.1 C)   TempSrc:   Oral   SpO2:   97%   Weight:  (!) 139.2 kg    Height:        Intake/Output Summary (Last 24 hours) at 03/26/2019 1059 Last data filed at 03/26/2019 0900 Gross per 24 hour  Intake 2380.52 ml  Output 2750 ml  Net -369.48 ml   Filed Weights   03/25/19 1103 03/25/19 1626 03/26/19 0400  Weight: 136.1 kg (!) 138.3 kg (!) 139.2 kg    Telemetry    NSR rate 67, axis WNL, intervals WNL, no acute ST T wave changes - Personally Reviewed  ECG    NA - Personally Reviewed  Physical Exam   GEN: No acute distress.   Neck: No  JVD Cardiac: RRR, no murmurs, rubs, or gallops.  Respiratory: Clear  to auscultation bilaterally. GI: Soft, nontender, non-distended  MS:  Mild edema; No deformity. Neuro:  Nonfocal  Psych: Normal affect   Labs    Chemistry Recent Labs  Lab 03/25/19 1116 03/26/19 0345  NA 136 133*  K 4.1 4.3  CL 103 104  CO2 23 22  GLUCOSE 324* 266*  BUN 13 12  CREATININE 0.72  0.71  CALCIUM 8.4* 8.6*  GFRNONAA >60 >60  GFRAA >60 >60  ANIONGAP 10 7     Hematology Recent Labs  Lab 03/25/19 1116 03/26/19 0345  WBC 7.8 9.1  RBC 5.14 4.67  HGB 13.6 12.9*  HCT 44.6 39.6  MCV 86.8 84.8  MCH 26.5 27.6  MCHC 30.5 32.6  RDW 15.9* 16.2*  PLT 234 224   Lab Results  Component Value Date   HGBA1C 12.4 (H) 02/20/2019    Cardiac EnzymesNo results for input(s): TROPONINI in the last 168 hours. No results for input(s): TROPIPOC in the last 168 hours.   BNPNo results for input(s): BNP, PROBNP in the last 168 hours.   DDimer No results for input(s): DDIMER in the last 168 hours.   Radiology    Dg Chest 2 View  Result Date: 03/25/2019 CLINICAL DATA:  Chest pain EXAM: CHEST - 2 VIEW COMPARISON:  12/08/2018 FINDINGS: Cardiomegaly. Both lungs are clear. Unchanged pleural thickening about the lateral left lower lung. The visualized skeletal structures are unremarkable. IMPRESSION: Cardiomegaly without acute abnormality of the lungs. Electronically Signed   By: Erasmo Score.D.  On: 03/25/2019 12:12    Cardiac Studies   Echo pending.   Patient Profile     48 y.o. male with extensive CAD, prior MIs and multiple PCIs/stents, DM, HTN, HLD, obesity and former smoker transferred from [facility] for NSTEMI.   Assessment & Plan    NSTEMI:    Elevation in trop.  On for cath tomorrow.  The patient understands that risks included but are not limited to stroke (1 in 1000), death (1 in 63), kidney failure [usually temporary] (1 in 500), bleeding (1 in 200), allergic reaction [possibly serious] (1 in 200).  The patient understands and agrees to proceed.  Continue heparin, ASA  CHRONIC DIASTOLIC HF:   Seems to be euvolemic.  No change in therapy.   DM:   A1C is 12.4.  He has been off of his meds and these have been restarted.  Meds and diet and lifestyle are all a significant issue.  Metformin is held for cath but needs to be restarted  He needs to be back on Jardiance at  discharge.  Not available on formulary here.     HTN:  BP is mildly elevated.  Needs to be on ARB/ACE inhibitor at discharge.  I did look back and some some increased creat so this would need to be followed.      PROLONGED QT:  No symptoms associated.  Avoid QT prolonging meds.  QT OK this morning  DYSLIPIDEMIA:  Trig are elevated.  Direct LDL is OK.  I restarted fenofibrate. Also on Zetia and Crestor.     For questions or updates, please contact [place] Please consult [email or web address] for contact info under Cardiology/STEMI.   Signed, Minus Breeding, MD  03/26/2019, 10:59 AM

## 2019-03-26 NOTE — Progress Notes (Signed)
[place] for Heparin Indication: chest pain/ACS  Heparin Dosing Weight: 85.6 kg  Labs: Recent Labs    03/25/19 1116 03/25/19 1312 03/25/19 1940 03/25/19 2157 03/26/19 0345  HGB 13.6  --   --   --  12.9*  HCT 44.6  --   --   --  39.6  PLT 234  --   --   --  224  HEPARINUNFRC  --   --  <0.10*  --  0.16*  CREATININE 0.72  --   --   --   --   TROPONINIHS 164* 554* 4,315* 4,128*  --     Estimated Creatinine Clearance: 147.4 mL/min (by C-G formula based on SCr of 0.72 mg/dL).  Assessment: 66 yom presenting with CP, elevated troponin. Pharmacy consulted to dose heparin for ACS. Pt is not on anticoagulation PTA. CBC WNL. No active bleed issues documented.  Initial heparin level is undetectable though drawn early. No issues with infusion per RN or S/Sx bleeding.  9/27 AM update:  Heparin level low but trending up No issues per RN  Goal of Therapy:  Heparin level 0.3-0.7 units/ml Monitor platelets by anticoagulation protocol: Yes   Plan:  -Heparin 2000 units BOLUS -Inc heparin drip to 1650 units/hr -Re-check heparin level at [place], PharmD, [place] Pharmacist Phone: [phone]

## 2019-03-27 ENCOUNTER — Inpatient Hospital Stay (HOSPITAL_COMMUNITY)
Admission: EM | Disposition: A | Discharge status: Home / Self Care | Payer: Self-pay | Source: Home / Self Care | Attending: Cardiology

## 2019-03-27 DIAGNOSIS — I2510 Atherosclerotic heart disease of native coronary artery without angina pectoris: Secondary | ICD-10-CM

## 2019-03-27 HISTORY — PX: CORONARY STENT INTERVENTION: CATH118234

## 2019-03-27 HISTORY — PX: LEFT HEART CATH AND CORONARY ANGIOGRAPHY: CATH118249

## 2019-03-27 LAB — BASIC METABOLIC PANEL
Anion gap: 11 (ref 5–15)
BUN: 16 mg/dL (ref 6–20)
CO2: 24 mmol/L (ref 22–32)
Calcium: 8.8 mg/dL — ABNORMAL LOW (ref 8.9–10.3)
Chloride: 100 mmol/L (ref 98–111)
Creatinine, Ser: 0.89 mg/dL (ref 0.61–1.24)
GFR calc Af Amer: >60 mL/min (ref >60)
GFR calc non Af Amer: >60 mL/min (ref >60)
Glucose, Bld: 337 mg/dL — ABNORMAL HIGH (ref 70–99)
Potassium: 4.1 mmol/L (ref 3.5–5.1)
Sodium: 135 mmol/L (ref 135–145)

## 2019-03-27 LAB — GLUCOSE, CAPILLARY
Glucose-Capillary: 349 mg/dL — ABNORMAL HIGH (ref 70–99)
Glucose-Capillary: 234 mg/dL — ABNORMAL HIGH (ref 70–99)
Glucose-Capillary: 191 mg/dL — ABNORMAL HIGH (ref 70–99)
Glucose-Capillary: 229 mg/dL — ABNORMAL HIGH (ref 70–99)

## 2019-03-27 LAB — CBC
HCT: 41.9 % (ref 39.0–52.0)
Hemoglobin: 13.5 g/dL (ref 13.0–17.0)
MCH: 27 pg (ref 26.0–34.0)
MCHC: 32.2 g/dL (ref 30.0–36.0)
MCV: 83.8 fL (ref 80.0–100.0)
Platelets: 242 10*3/uL (ref 150–400)
RBC: 5 MIL/uL (ref 4.22–5.81)
RDW: 15.9 % — ABNORMAL HIGH (ref 11.5–15.5)
WBC: 8.7 10*3/uL (ref 4.0–10.5)
nRBC: 0 % (ref 0.0–0.2)

## 2019-03-27 LAB — HEPARIN LEVEL (UNFRACTIONATED): Heparin Unfractionated: 0.49 IU/mL (ref 0.30–0.70)

## 2019-03-27 LAB — POCT ACTIVATED CLOTTING TIME
Activated Clotting Time: 257 seconds
Activated Clotting Time: 301 seconds
Activated Clotting Time: 219 seconds
Activated Clotting Time: 186 seconds

## 2019-03-27 SURGERY — LEFT HEART CATH AND CORONARY ANGIOGRAPHY
Anesthesia: LOCAL

## 2019-03-27 MED ORDER — IOHEXOL 350 MG/ML SOLN
INTRAVENOUS | Status: DC | PRN
  Administered 2019-03-27: 120 mL via INTRACARDIAC

## 2019-03-27 MED ORDER — ACETAMINOPHEN 325 MG PO TABS
650.0000 mg | ORAL_TABLET | ORAL | Status: DC | PRN
  Administered 2019-03-27: 650 mg via ORAL
  Filled 2019-03-27: qty 2

## 2019-03-27 MED ORDER — HYDRALAZINE HCL 20 MG/ML IJ SOLN: 10.0000 mg | INTRAMUSCULAR | Status: AC | PRN

## 2019-03-27 MED ORDER — HEPARIN (PORCINE) IN NACL 1000-0.9 UT/500ML-% IV SOLN
INTRAVENOUS | Status: AC
  Filled 2019-03-27: qty 1000

## 2019-03-27 MED ORDER — HEPARIN (PORCINE) IN NACL 1000-0.9 UT/500ML-% IV SOLN
INTRAVENOUS | Status: DC | PRN
  Administered 2019-03-27 (×2): 500 mL

## 2019-03-27 MED ORDER — TICAGRELOR 90 MG PO TABS
ORAL_TABLET | ORAL | Status: AC
  Filled 2019-03-27: qty 2

## 2019-03-27 MED ORDER — SODIUM CHLORIDE 0.9 % IV SOLN
INTRAVENOUS | Status: AC | PRN
  Administered 2019-03-27: 1 mL/kg/h via INTRAVENOUS

## 2019-03-27 MED ORDER — ASPIRIN 81 MG PO CHEW
81.0000 mg | CHEWABLE_TABLET | Freq: Every day | ORAL | Status: DC
  Administered 2019-03-28 – 2019-03-29 (×2): 81 mg via ORAL
  Filled 2019-03-27 (×2): qty 1

## 2019-03-27 MED ORDER — HEPARIN SODIUM (PORCINE) 1000 UNIT/ML IJ SOLN
INTRAMUSCULAR | Status: AC
  Filled 2019-03-27: qty 1

## 2019-03-27 MED ORDER — INSULIN GLARGINE 100 UNIT/ML ~~LOC~~ SOLN
36.0000 [IU] | Freq: Two times a day (BID) | SUBCUTANEOUS | Status: DC
  Administered 2019-03-27 – 2019-03-29 (×5): 36 [IU] via SUBCUTANEOUS
  Filled 2019-03-27 (×7): qty 0.36

## 2019-03-27 MED ORDER — LIDOCAINE HCL (PF) 1 % IJ SOLN
INTRAMUSCULAR | Status: DC | PRN
  Administered 2019-03-27: 30 mL via INTRADERMAL

## 2019-03-27 MED ORDER — HEPARIN SODIUM (PORCINE) 1000 UNIT/ML IJ SOLN
INTRAMUSCULAR | Status: DC | PRN
  Administered 2019-03-27: 5000 [IU] via INTRAVENOUS
  Administered 2019-03-27: 15000 [IU] via INTRAVENOUS

## 2019-03-27 MED ORDER — SODIUM CHLORIDE 0.9 % IV SOLN: INTRAVENOUS | Status: AC

## 2019-03-27 MED ORDER — TICAGRELOR 90 MG PO TABS
90.0000 mg | ORAL_TABLET | Freq: Two times a day (BID) | ORAL | Status: DC
  Administered 2019-03-27 – 2019-03-29 (×4): 90 mg via ORAL
  Filled 2019-03-27 (×4): qty 1

## 2019-03-27 MED ORDER — SODIUM CHLORIDE 0.9% FLUSH: 3.0000 mL | Freq: Two times a day (BID) | INTRAVENOUS | Status: DC

## 2019-03-27 MED ORDER — VERAPAMIL HCL 2.5 MG/ML IV SOLN
INTRAVENOUS | Status: AC
  Filled 2019-03-27: qty 2

## 2019-03-27 MED ORDER — INSULIN GLARGINE 100 UNIT/ML SOLOSTAR PEN: 36.0000 [IU] | PEN_INJECTOR | Freq: Two times a day (BID) | SUBCUTANEOUS | Status: DC

## 2019-03-27 MED ORDER — LABETALOL HCL 5 MG/ML IV SOLN: 10.0000 mg | INTRAVENOUS | Status: AC | PRN

## 2019-03-27 MED ORDER — TICAGRELOR 90 MG PO TABS
ORAL_TABLET | ORAL | Status: DC | PRN
  Administered 2019-03-27: 180 mg via ORAL

## 2019-03-27 MED ORDER — SODIUM CHLORIDE 0.9 % IV SOLN: 250.0000 mL | INTRAVENOUS | Status: DC | PRN

## 2019-03-27 MED ORDER — LIDOCAINE HCL (PF) 1 % IJ SOLN
INTRAMUSCULAR | Status: AC
  Filled 2019-03-27: qty 30

## 2019-03-27 MED ORDER — SODIUM CHLORIDE 0.9% FLUSH: 3.0000 mL | INTRAVENOUS | Status: DC | PRN

## 2019-03-27 SURGICAL SUPPLY — 28 items
BALLN SAPPHIRE 2.0X12 (BALLOONS) ×2
BALLN SAPPHIRE 2.5X12 (BALLOONS) ×2
BALLN SAPPHIRE ~~LOC~~ 3.75X10 (BALLOONS) ×2 IMPLANT
BALLN ~~LOC~~ EMERGE MR 2.5X8 (BALLOONS) ×2
BALLOON SAPPHIRE 2.0X12 (BALLOONS) ×1 IMPLANT
BALLOON SAPPHIRE 2.5X12 (BALLOONS) ×1 IMPLANT
BALLOON ~~LOC~~ EMERGE MR 2.5X8 (BALLOONS) ×1 IMPLANT
CATH INFINITI 5FR JL4 (CATHETERS) ×2 IMPLANT
CATH INFINITI JR4 5F (CATHETERS) ×2 IMPLANT
CATH LAUNCHER 6FR JR4 (CATHETERS) ×2 IMPLANT
ELECT DEFIB PAD ADLT CADENCE (PAD) ×2 IMPLANT
GLIDESHEATH SLEND SS 6F .021 (SHEATH) IMPLANT
GUIDEWIRE INQWIRE 1.5J.035X260 (WIRE) IMPLANT
HOVERMATT SINGLE USE (MISCELLANEOUS) ×2 IMPLANT
INQWIRE 1.5J .035X260CM (WIRE)
KIT ENCORE 26 ADVANTAGE (KITS) ×2 IMPLANT
KIT HEART LEFT (KITS) ×2 IMPLANT
KIT HEMO VALVE WATCHDOG (MISCELLANEOUS) ×2 IMPLANT
PACK CARDIAC CATHETERIZATION (CUSTOM PROCEDURE TRAY) ×2 IMPLANT
SHEATH PINNACLE 5F 10CM (SHEATH) ×2 IMPLANT
SHEATH PINNACLE 6F 10CM (SHEATH) ×2 IMPLANT
SHEATH PROBE COVER 6X72 (BAG) ×2 IMPLANT
STENT SYNERGY DES 2.25X12 (Permanent Stent) ×2 IMPLANT
STENT SYNERGY DES 3.5X16 (Permanent Stent) ×2 IMPLANT
TRANSDUCER W/STOPCOCK (MISCELLANEOUS) ×2 IMPLANT
TUBING CIL FLEX 10 FLL-RA (TUBING) ×2 IMPLANT
WIRE ASAHI PROWATER 180CM (WIRE) ×2 IMPLANT
WIRE EMERALD 3MM-J .035X150CM (WIRE) ×2 IMPLANT

## 2019-03-27 NOTE — Progress Notes (Signed)
Per nurse tech, patient refused to have groin shaved for cardiac cath today.

## 2019-03-27 NOTE — Interval H&P Note (Signed)
Cath Lab Visit (complete for each Cath Lab visit)  Clinical Evaluation Leading to the Procedure:   ACS: Yes.    Non-ACS:    Anginal Classification: CCS IV  Anti-ischemic medical therapy: Minimal Therapy (1 class of medications)  Non-Invasive Test Results: No non-invasive testing performed  Prior CABG: No previous CABG      History and Physical Interval Note:  03/27/2019 12:59 PM  Marc Walker  has presented today for surgery, with the diagnosis of NSTEMI.  The various methods of treatment have been discussed with the patient and family. After consideration of risks, benefits and other options for treatment, the patient has consented to  Procedure(s): LEFT HEART CATH AND CORONARY ANGIOGRAPHY (N/A) as a surgical intervention.  The patient's history has been reviewed, patient examined, no change in status, stable for surgery.  I have reviewed the patient's chart and labs.  Questions were answered to the patient's satisfaction.     Larae Grooms

## 2019-03-27 NOTE — Progress Notes (Addendum)
   Chart reviewed. 48 y.o. 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   He was admitted with NSTEMI with peak troponin 4315. He also found to have hypertriglyceridemia (trigs 769) and uncontrolled DM. He was reported to have missed meds x 2 days but labs suggest longer period of noncompliance. Home Plavix on hold in case he may require CABG per H/P. Blood sugar remains poorly controlled. He was on insulin (basal + SSI) plus metformin and Jardiance at home. Now on SSI here. Metformin on hold for cath, Jardiance not on formulary. Will resume Lantus at half dose given NPO/need for cath. BP remains marginally above goal on IV NTG gtt - depending on cath results, consider switching metoprolol to carvedilol. (Need to be cautious not to complicate med regimen given his h/o noncompliance.) Of note, he is refusing to take Lasix - only takes 3-4 x a week due to inconvenience of frequent urination, so will put hold on med pending LVEDP on cath. He denies any orthopnea. He is seeking disability but was recently denied and plans to appeal. He cites financial difficulties as the biggest barrier to med compliance otherwise so will involve care management.  Per cath lab, procedure will be around lunch time. Risks/benefits/alternatives have been reviewed with the patient already.  Melina Copa PA-C  Patient seen and examined.  Agree with above documentation.  Remains on NTG gtt, no further chest pain.  Cath today.  Restart lantus given hyperglycemia.

## 2019-03-27 NOTE — Progress Notes (Signed)
Pt states he did not take lasix yesterday evening when given by day shift RN, patient says he didn't want to take it.

## 2019-03-27 NOTE — Plan of Care (Signed)
  Problem: Education: Goal: Knowledge of General Education information will improve Description: Including pain rating scale, medication(s)/side effects and non-pharmacologic comfort measures Outcome: Progressing   Problem: Health Behavior/Discharge Planning: Goal: Ability to manage health-related needs will improve Outcome: Progressing   Problem: Clinical Measurements: Goal: Cardiovascular complication will be avoided Outcome: Progressing   Problem: Pain Managment: Goal: General experience of comfort will improve Outcome: Progressing   

## 2019-03-27 NOTE — Progress Notes (Addendum)
Inpatient Diabetes Program Recommendations  AACE/ADA: New Consensus Statement on Inpatient Glycemic Control (2015)  Target Ranges:  Prepandial:   less than 140 mg/dL      Peak postprandial:   less than 180 mg/dL (1-2 hours)      Critically ill patients:  140 - 180 mg/dL   Results for JEMIAH, ELLENBURG (MRN [id number]) as of 03/27/2019 11:26  Ref. Range 03/26/2019 06:02 03/26/2019 11:40 03/26/2019 16:03 03/26/2019 21:32  Glucose-Capillary Latest Ref Range: 70 - 99 mg/dL 303 (H) 338 (H) 287 (H) 271 (H)   Results for CAMRYN, QUESINBERRY (MRN [id number]) as of 03/27/2019 11:26  Ref. Range 03/27/2019 06:24  Glucose-Capillary Latest Ref Range: 70 - 99 mg/dL 349 (H)   Results for LUIZ, TRUMPOWER (MRN [id number]) as of 03/27/2019 11:26  Ref. Range 02/20/2019 09:15  Hemoglobin A1C Latest Ref Range: 4.8 - 5.6 % 12.4 (H)    Admit NSTEMI  History: DM, Extensive CAD, Prior MIS, Obesity and Former Smoker   Home DM Meds: Lantus 72 units BID        Humalog 38 units TID       Jardiance 10 mg Daily       Metformin 500 mg BID   Current Orders: Lantus 36 units BID      Novolog Resistant Correction Scale/ SSI (0-20 units) TID AC     PCP: [facility]--last seen 01/2019--PCP increased Lantus to 72 units BID at that visit since A1c was elevated to 12.4%   Lantus to start this AM (50% total home dose).   NPO this AM for Cardiac Cath.   Spoke w/ pt by phone (DM Coordinator working on different campus today).  Pt confirmed above home diabetes meds.  Takes on regular basis and rarely misses meds.  Confirmed he saw his PCP on 02/16/2019 and his PCP increased his Lantus to 72 units BID.  No issues at home taking meds.  States he believes his A1c is elevated b/c he likes to eat and has a hard time controlling his portion sizes.  Does well avoiding sugary drinks (drinks mostly water and tea made with splenda or stevia). Also drinks diet soda.  Pt does admit to drinking regular sweet tea on occasion.  Stated to me he has a  hard time eating out.  Stated he could eat three pizzas if he goes to a pizza buffet.  We discussed avoiding restaurants that have buffets and also discussed ways for pt to help better control his portion sizes (ie. Drink water before starting every meal, place 1/2 entree into a to-go box before eating when at restaurants, leaving food on the stove top or counter at home and drinking plenty of water with meals).  We also discussed incorporating lean proteins into meals to help pt feel fuller longer.  Spoke with patient about his current A1c of 12.4%.  Explained what an A1c is and what it measures.  Reminded patient that his goal A1c is 7% or less per ADA standards to prevent both acute and long-term complications.  Explained to patient the extreme importance of good glucose control at home.  Encouraged patient to check his CBGs at least TID AC at home and to record all CBGs in a logbook for his PCP to review.    --Will follow patient during hospitalization--  Wyn Quaker RN, MSN, CDE Diabetes Coordinator Inpatient Glycemic Control Team Team Pager: [phone] (8a-5p)

## 2019-03-27 NOTE — Progress Notes (Signed)
Placed patient on CPAP for the night with pressure set at 18cm as per home setting.

## 2019-03-27 NOTE — Progress Notes (Addendum)
ANTICOAGULATION CONSULT NOTE  Pharmacy Consult for Heparin Indication: chest pain/ACS  Heparin Dosing Weight: 96 kg  Labs: Recent Labs    03/25/19 1116 03/25/19 1312  03/25/19 1940 03/25/19 2157 03/26/19 0345 03/26/19 1240 03/26/19 1949 03/27/19 0722  HGB 13.6  --   --   --   --  12.9*  --   --  13.5  HCT 44.6  --   --   --   --  39.6  --   --  41.9  PLT 234  --   --   --   --  224  --   --  242  HEPARINUNFRC  --   --    < > <0.10*  --  0.16* 0.26* 0.28* 0.49  CREATININE 0.72  --   --   --   --  0.71  --   --  0.89  TROPONINIHS 164* 554*  --  4,315* 4,128*  --   --   --   --    < > = values in this interval not displayed.    Estimated Creatinine Clearance: 132.2 mL/min (by C-G formula based on SCr of 0.89 mg/dL).  Assessment: 82 yom presenting with CP, elevated troponin. Pharmacy consulted to dose heparin for ACS with + troponins. Pt is not on anticoagulation PTA. Cath planned for today.   Heparin level in range 0.49   Goal of Therapy:  Heparin level 0.3-0.7 units/ml Monitor platelets by anticoagulation protocol: Yes   Plan:  -Heparin level in range - Will continue Heparin at 2000 units/hr - Will recheck level in 8 hrs from last check -Daily heparin level and CBC   Duanne Limerick, PharmD, BCPS Clinical Pharmacist 03/27/2019

## 2019-03-27 NOTE — Progress Notes (Signed)
Site area: Right groin a 6 french arterial sheath was removed  Site Prior to Removal:  Level 0  Pressure Applied For 40 MINUTES    Bedrest Beginning at 1645pm  Manual:   Yes.    Patient Status During Pull:  stable  Post Pull Groin Site:  Level 0  Post Pull Instructions Given:  Yes.    Post Pull Pulses Present:  Yes.    Dressing Applied:  Yes.    Comments:  VS remain stable

## 2019-03-28 ENCOUNTER — Inpatient Hospital Stay (HOSPITAL_COMMUNITY): Payer: Self-pay

## 2019-03-28 ENCOUNTER — Encounter (HOSPITAL_COMMUNITY): Payer: Self-pay | Admitting: Interventional Cardiology

## 2019-03-28 DIAGNOSIS — I5032 Chronic diastolic (congestive) heart failure: Secondary | ICD-10-CM

## 2019-03-28 DIAGNOSIS — T148XXA Other injury of unspecified body region, initial encounter: Secondary | ICD-10-CM

## 2019-03-28 DIAGNOSIS — E1142 Type 2 diabetes mellitus with diabetic polyneuropathy: Secondary | ICD-10-CM

## 2019-03-28 DIAGNOSIS — I214 Non-ST elevation (NSTEMI) myocardial infarction: Secondary | ICD-10-CM

## 2019-03-28 LAB — HEPATIC FUNCTION PANEL
ALT: 44 U/L (ref 0–44)
AST: 36 U/L (ref 15–41)
Albumin: 3.2 g/dL — ABNORMAL LOW (ref 3.5–5.0)
Alkaline Phosphatase: 53 U/L (ref 38–126)
Bilirubin, Direct: <0.1 mg/dL (ref 0.0–0.2)
Total Bilirubin: 0.9 mg/dL (ref 0.3–1.2)
Total Protein: 6.4 g/dL — ABNORMAL LOW (ref 6.5–8.1)

## 2019-03-28 LAB — BASIC METABOLIC PANEL
Anion gap: 11 (ref 5–15)
BUN: 11 mg/dL (ref 6–20)
CO2: 20 mmol/L — ABNORMAL LOW (ref 22–32)
Calcium: 8.4 mg/dL — ABNORMAL LOW (ref 8.9–10.3)
Chloride: 102 mmol/L (ref 98–111)
Creatinine, Ser: 0.81 mg/dL (ref 0.61–1.24)
GFR calc Af Amer: >60 mL/min (ref >60)
GFR calc non Af Amer: >60 mL/min (ref >60)
Glucose, Bld: 271 mg/dL — ABNORMAL HIGH (ref 70–99)
Potassium: 3.8 mmol/L (ref 3.5–5.1)
Sodium: 133 mmol/L — ABNORMAL LOW (ref 135–145)

## 2019-03-28 LAB — CBC
HCT: 41.3 % (ref 39.0–52.0)
Hemoglobin: 12.9 g/dL — ABNORMAL LOW (ref 13.0–17.0)
MCH: 26.5 pg (ref 26.0–34.0)
MCHC: 31.2 g/dL (ref 30.0–36.0)
MCV: 85 fL (ref 80.0–100.0)
Platelets: 214 10*3/uL (ref 150–400)
RBC: 4.86 MIL/uL (ref 4.22–5.81)
RDW: 15.9 % — ABNORMAL HIGH (ref 11.5–15.5)
WBC: 7.8 10*3/uL (ref 4.0–10.5)
nRBC: 0 % (ref 0.0–0.2)

## 2019-03-28 LAB — GLUCOSE, CAPILLARY
Glucose-Capillary: 266 mg/dL — ABNORMAL HIGH (ref 70–99)
Glucose-Capillary: 342 mg/dL — ABNORMAL HIGH (ref 70–99)
Glucose-Capillary: 261 mg/dL — ABNORMAL HIGH (ref 70–99)
Glucose-Capillary: 285 mg/dL — ABNORMAL HIGH (ref 70–99)
Glucose-Capillary: 233 mg/dL — ABNORMAL HIGH (ref 70–99)

## 2019-03-28 LAB — ECHOCARDIOGRAM COMPLETE
Height: 65.5 in
Weight: 4906.56 oz

## 2019-03-28 MED ORDER — STUDY - AEGIS II STUDY - PLACEBO OR CSL112 (PI-HILTY)
170.0000 mL | Freq: Once | INTRAVENOUS | Status: AC
  Administered 2019-03-28: 16:00:00 170 mL via INTRAVENOUS
  Filled 2019-03-28: qty 170

## 2019-03-28 MED ORDER — PERFLUTREN LIPID MICROSPHERE
1.0000 mL | INTRAVENOUS | Status: AC | PRN
  Administered 2019-03-28: 2 mL via INTRAVENOUS
  Filled 2019-03-28: qty 10

## 2019-03-28 MED FILL — Verapamil HCl IV Soln 2.5 MG/ML: INTRAVENOUS | Qty: 2 | Status: AC

## 2019-03-28 NOTE — Progress Notes (Signed)
Right groin limited arterial       has been completed. Preliminary results can be found under CV proc through chart review. June Leap, BS, RDMS, RVT

## 2019-03-28 NOTE — Research (Signed)
Patient evaluated for possible enrollment in AEGIS II trial.  Petra Kuba of trial, purpose, known data, possible risks and benefits all explained to the patient, and patient examined.    VSS, as noted.   Alert, oriented, coherent JVP flat Lungs clear Cardiac rhythm regular.  No definite murmurs.  Trace edema  Cath data reviewed in detail.    Patient thought to meet criteria for AEGIS II study.    Patient is [place] Class I-II.    Loretha Brasil. Lia Foyer, MD, [facility], [place] Director, [facility] for CV Research/[facility]

## 2019-03-28 NOTE — Progress Notes (Signed)
  Echocardiogram 2D Echocardiogram has been performed.  Marc Walker 03/28/2019, 10:26 AM

## 2019-03-28 NOTE — Plan of Care (Signed)
  Problem: Clinical Measurements: Goal: Respiratory complications will improve Outcome: Progressing Note: No s/s of respiratory complications noted.  Stable on room air. Goal: Cardiovascular complication will be avoided Outcome: Progressing Note: Discussed importance of taking medications as prescribed, particularly lasix, in order to prevent cardiovascular complications.

## 2019-03-28 NOTE — Progress Notes (Addendum)
Progress Note  Patient Name: Marc Walker Date of Encounter: 03/28/2019  Primary Cardiologist: Dorris Carnes, MD   Subjective   Patient denies chest pain. Right groin cath site is clean and dry with a tender hematoma. Patient is ambulating without symptoms.   Inpatient Medications    Scheduled Meds: . aspirin  81 mg Oral Daily  . ezetimibe  10 mg Oral Daily  . fenofibrate  160 mg Oral Daily  . insulin aspart  0-20 Units Subcutaneous TID WC  . insulin glargine  36 Units Subcutaneous BID  . levothyroxine  200 mcg Oral Q0600  . metoprolol tartrate  50 mg Oral BID  . pantoprazole  40 mg Oral Daily  . rosuvastatin  40 mg Oral Daily  . sodium chloride flush  3 mL Intravenous Q12H  . sodium chloride flush  3 mL Intravenous Q12H  . ticagrelor  90 mg Oral BID   Continuous Infusions: . sodium chloride Stopped (03/25/19 2129)  . sodium chloride    . nitroGLYCERIN 10 mcg/min (03/27/19 0038)   PRN Meds: sodium chloride, sodium chloride, acetaminophen, nitroGLYCERIN, sodium chloride flush   Vital Signs    Vitals:   03/27/19 2149 03/27/19 2157 03/28/19 0306 03/28/19 0739  BP:    121/67  Pulse: 88 75  75  Resp:  18  20  Temp:    97.9 F (36.6 C)  TempSrc:    Oral  SpO2:  95%  95%  Weight:   (!) 139.1 kg   Height:        Intake/Output Summary (Last 24 hours) at 03/28/2019 0945 Last data filed at 03/28/2019 0500 Gross per 24 hour  Intake 880 ml  Output 3100 ml  Net -2220 ml   Last 3 Weights 03/28/2019 03/27/2019 03/26/2019  Weight (lbs) 306 lb 10.6 oz 306 lb 1.6 oz 306 lb 12.8 oz  Weight (kg) 139.1 kg 138.846 kg 139.164 kg      Telemetry    NSR, HR 70-80s; first degree AV block (0.22 s) - Personally Reviewed  ECG    NSR, 70 bpm, poor r wave progression - Personally Reviewed  Physical Exam   GEN: No acute distress.   Neck: No JVD Cardiac: RRR, no murmurs, rubs, or gallops.  Respiratory: Clear to auscultation bilaterally. GI: Soft, nontender, non-distended  MS:1+  edema; No deformity; right groin cath site clean and dry with minimally firm hematoma; no bruit Neuro:  Nonfocal  Psych: Normal affect   Labs    High Sensitivity Troponin:   Recent Labs  Lab 03/25/19 1116 03/25/19 1312 03/25/19 1940 03/25/19 2157  TROPONINIHS 164* 554* 4,315* 4,128*      Chemistry Recent Labs  Lab 03/26/19 0345 03/27/19 0722 03/28/19 0711  NA 133* 135 133*  K 4.3 4.1 3.8  CL 104 100 102  CO2 22 24 20*  GLUCOSE 266* 337* 271*  BUN 12 16 11   CREATININE 0.71 0.89 0.81  CALCIUM 8.6* 8.8* 8.4*  GFRNONAA >60 >60 >60  GFRAA >60 >60 >60  ANIONGAP 7 11 11      Hematology Recent Labs  Lab 03/26/19 0345 03/27/19 0722 03/28/19 0711  WBC 9.1 8.7 7.8  RBC 4.67 5.00 4.86  HGB 12.9* 13.5 12.9*  HCT 39.6 41.9 41.3  MCV 84.8 83.8 85.0  MCH 27.6 27.0 26.5  MCHC 32.6 32.2 31.2  RDW 16.2* 15.9* 15.9*  PLT 224 242 214    BNPNo results for input(s): BNP, PROBNP in the last 168 hours.   DDimer No results  for input(s): DDIMER in the last 168 hours.   Radiology    No results found.  Cardiac Studies   Cardiac Cath 03/27/19  Non-stenotic Ost LAD to Prox LAD lesion was previously treated.  Previously placed Prox LAD to Mid LAD stent (unknown type) is widely patent.  2nd Diag lesion is 90% stenosed.  Mid Cx lesion is 70% stenosed. This is a small vessel after the origin of the large OM.  Non-stenotic Ost 2nd Mrg to 2nd Mrg lesion was previously treated.  Ost RPDA to RPDA lesion is 20% stenosed.  Mid LAD to Dist LAD lesion is 10% stenosed.  Previously placed 1st Mrg-1 stent (unknown type) is widely patent.  1st Mrg-2 lesion is 50% stenosed.  Mid RCA-1 lesion is 95% stenosed.  A drug-eluting stent was successfully placed using a STENT SYNERGY DES 3.5X16.  Post intervention, there is a 0% residual stenosis.  Previously placed Mid RCA-2 stent (unknown type) is widely patent.  RPAV-1 lesion is 80% stenosed.  A drug-eluting stent was successfully  placed using a STENT SYNERGY DES 2.25X12.  Post intervention, there is a 0% residual stenosis.  RPAV-2 lesion is 50% stenosed. THese is diffuse small vessel disease in the distal vasculature.  Prox RCA lesion is 20% stenosed.  3rd RPL lesion is 10% stenosed.  Mid RCA to Dist RCA lesion is 20% stenosed.  The left ventricular systolic function is normal.  LV end diastolic pressure is normal.  The left ventricular ejection fraction is 55-65% by visual estimate.  There is no aortic valve stenosis.   Continue aggressive secondary prevention.  I stressed the importance of DAPT for 1 year.  He may need to change from Brilinta to Plavix after 1 month.  Would consider lifelong clopidogrel monotherapy after 1 year given his diffuse CAD.  Coronary Diagrams  Diagnostic Dominance: Right  Intervention    Echo pending  Patient Profile     48 y.o. male with extensive CAD, prior MIs and multiple PCIs/stents, DM, HTN, HLD, obesity, former smoker, and [facility]compliance [facility]  Assessment & Plan    NSTEMI/CAD s/p multiple PCIs  He was admitted with peak troponin 4315. - Patient had cath yesterday showing diffuse CAD. He received PCI to mid RCA and RPAV-1. Post cath recommendations for change to Brilinta from plavix after 1 month with possible lifelong monotherapy with clopidogrel given diffuse CAD.  - Echo results pending - Cath site right groin with  tenderness and bruising >> will get a vascular duplex. - Hgb stable, 12.9 Creatinine 0.81 - Continue BB. Will add low dose ACE/ARB on discharge - Patient walked with cardiac rehab without any symptoms - Recommend better control of risk factors with diet, lifestyle, and DM control - NTG at discharge - Likely discharge today if echo is stable  Chronic diastolic HF - EF from 2018 is 16-10%60-65% - Echo results pending  - Has some lower leg edema on exam >> continue lasix at discharge (was on 80 mg 2 times daily, but  reported taking it 3-4 times weekly) - Continue BB  Uncontrolled DM2 - A1C 12.4 - He says he has been off his meds for some time - He will need to restart medications and work on diet/lifestyle changes  HTN - Lopressor 50 mg BID home med - Has some elevated pressure during admission - Will discharge patient with low dose ACE/ARB for better pressure control  Social Issues/noncompliance - Patient reports long history of medication noncompliance due to financial reasons -  Case manager on board to help with Brilinta  Prolonged QT - No symptoms associated - QtC 462 today - Avoid QT prolonging meds  Dyslipidemia - On Crestor and Zetia - Fenofibrate restarted - TG 769, HDL 30 - Recommend lifestyle changes   For questions or updates, please contact [facility] Please consult [email or web address] for contact info under        Signed, Cadence David Stall, PA-C  03/28/2019, 9:45 AM    Patient seen and examined.  Agree with above documentation.  Patient is status post PCI to her RCA with DES x2 yesterday.  He denies any further chest or jaw pain.  Denies any dyspnea.  Does report pain at cath site.  On exam he is alert and oriented, regular rate and rhythm, no murmurs, no lower extremity edema, no JVD.  Cath site notable for significant bruising and swelling extending across groin and into testicles.  Will check a vascular duplex.  Otherwise for his CAD, will continue aspirin, ticagrelor, statin.  TTE completed today, shows normal LV function and no significant valvular disease.

## 2019-03-28 NOTE — Progress Notes (Signed)
CPAP at patients bedside. Patient stated he would place himself on when he is ready. RT will monitor as needed.

## 2019-03-28 NOTE — Progress Notes (Signed)
CARDIAC REHAB PHASE I   PRE:  Rate/Rhythm: 82 SR  BP:  Supine:   Sitting: 132/90  Standing:    SaO2:   MODE:  Ambulation: 400 ft   POST:  Rate/Rhythm: 107 ST  BP:  Supine:   Sitting: 157/74  Standing:    SaO2: 100%RA [id number] Pt is limited in mobility by arthritis in right hip. Also with shoulder pain using walker to walk. Able to walk 400 ft on RA. Encouraged him to rest as needed. Pt stated he needs new cane as his cane is broken. Would recommend cane for home and to increase activity, a rollator may be beneficial as he could rest and then go farther. Used to do some water aerobics when he was close to gym with pool and he could afford. Pt interested in referral to [place] [facility] which was made.  Pt stated he uses scooter at [place] as he cannot walk far even with shopping cart. Pt also stated he has difficulty remembering to take meds. Discussed the importance  of taking brilinta without missing doses. Encouraged him to set App or alarm on phone to remind him. Discussed blister packs which he stated is hard for him to tear. Also discussed putting meds in pill holder labeled for the am and pm of days of week. Stated still hard for him to remember to take.  Discussed with pt that his A1 C is 12.4 and he needs to watch carbs and take his insulin and meds. Pt stated he had never really been taught carb counting. Gave him a handout and discussed, encouraging him to read and study at home. Reviewed MI restrictions, NTG use, and walking as tolerated.  Mi booklet, diabetic and heart healthy diets given.    Graylon Good, RN BSN  03/28/2019 9:26 AM

## 2019-03-28 NOTE — Plan of Care (Signed)
  Problem: Clinical Measurements: Goal: Will remain free from infection Outcome: Progressing Note:  No s/s of infection noted. Goal: Respiratory complications will improve Outcome: Progressing Note:  No s/s of respiratory complications noted.   

## 2019-03-28 NOTE — Progress Notes (Signed)
Inpatient Diabetes Program Recommendations  AACE/ADA: New Consensus Statement on Inpatient Glycemic Control   Target Ranges:  Prepandial:   less than 140 mg/dL      Peak postprandial:   less than 180 mg/dL (1-2 hours)      Critically ill patients:  140 - 180 mg/dL   Results for Marc Walker, Marc Walker (MRN [id number]) as of 03/28/2019 10:11  Ref. Range 03/27/2019 06:24 03/27/2019 11:01 03/27/2019 15:22 03/27/2019 17:38 03/27/2019 20:58 03/28/2019 07:55  Glucose-Capillary Latest Ref Range: 70 - 99 mg/dL 349 (H) 266 (H) 234 (H) 191 (H) 229 (H) 342 (H)   Review of Glycemic Control  Diabetes history: DM2 Outpatient Diabetes medications: Lantus 72 units BID, Humalog 38 units TID, Jardiance 10 mg daily, Metformin 500 mg BID Current orders for Inpatient glycemic control: Lantus 36 units BID, Novolog 0-20 units TID with meals  Inpatient Diabetes Program Recommendations:   Insulin-Basal: Please consider increasing Lantus to 40 units BID.  Insulin-Meal Coverage: Please consider ordering Novolog 4 units TID with meals for meal coverage if patient eats at least 50% of meals.  Insulin-Correction: Please consider ordering Novolog 0-5 units QHS for bedtime correction.  Thanks, Barnie Alderman, RN, MSN, CDE Diabetes Coordinator Inpatient Diabetes Program [phone] (Team Pager from 8am to 5pm)

## 2019-03-28 NOTE — TOC Initial Note (Signed)
Transition of Care [facility]) - Initial/Assessment Note    Patient Details  Name: Marc Walker MRN: [id number] Date of Birth: 08-08-1970  [facility] CM/SW Contact:    Bethena Roys, RN Phone Number: 03/28/2019, 9:55 AM  Clinical Narrative:    Pt presented for Chest Pain. PTA from home with support of parents. Patient currently without insurance. Patient utilizes the [facility] and the onsite pharmacy. Patient will be on Brilinta- CM did discuss the [place] and patient is agreeable to have medications sent to the bedside. Message sent to the PA for cardiology to send medications to TOC. The [facility] will continue to follow for Brilinta assistance via the company patient assistance application.  If the cost is too expensive, CM will then try to assist with cost via [place] if not used. CM did call the [facility] to schedule an appointment at the clinic. Cane to be provided to patient at discharge. Patient states parents will provide transportation home. No further needs at this time.            Expected Discharge Plan: Home/Self Care Barriers to Discharge: No Barriers Identified   Patient Goals and CMS Choice Patient states their goals for this hospitalization and ongoing recovery are:: "to return home"   Choice offered to / list presented to : NA  Expected Discharge Plan and Services Expected Discharge Plan: Home/Self Care In-house Referral: NA Discharge Planning Services: CM Consult, Follow-up appt scheduled, [facility], Medication Assistance Post Acute Care Choice: NA Living arrangements for the past 2 months: Single Family Home                 HH Arranged: NA   Prior Living Arrangements/Services Living arrangements for the past 2 months: Single Family Home Lives with:: Parents Patient language and need for interpreter reviewed:: Yes Do you feel safe going back to the place where you live?: Yes      Need for Family Participation in Patient Care:  Yes (Comment) Care giver support system in place?: Yes (comment)   Criminal Activity/Legal Involvement Pertinent to Current Situation/Hospitalization: No - Comment as needed  Activities of Daily Living Home Assistive Devices/Equipment: CPAP ADL Screening (condition at time of admission) Patient's cognitive ability adequate to safely complete daily activities?: Yes Is the patient deaf or have difficulty hearing?: No Does the patient have difficulty seeing, even when wearing glasses/contacts?: No Does the patient have difficulty concentrating, remembering, or making decisions?: No Patient able to express need for assistance with ADLs?: Yes Does the patient have difficulty dressing or bathing?: No Independently performs ADLs?: Yes (appropriate for developmental age) Does the patient have difficulty walking or climbing stairs?: No Weakness of Legs: None Weakness of Arms/Hands: None  Permission Sought/Granted Permission sought to share information with : Family Supports, Chartered certified accountant granted to share information with : Yes, Verbal Permission Granted     Permission granted to share info w AGENCY: [facility]        Emotional Assessment Appearance:: Appears stated age Attitude/Demeanor/Rapport: Engaged Affect (typically observed): Accepting Orientation: : Oriented to Situation, Oriented to  Time, Oriented to Place, Oriented to Self Alcohol / Substance Use: Not Applicable Psych Involvement: No (comment)  Admission diagnosis:  Chest pain Patient Active Problem List   Diagnosis Date Noted  . ACS (acute coronary syndrome) ([place]) 03/25/2019  . NSTEMI (non-ST elevated myocardial infarction) ([place]) 03/25/2019  . Primary osteoarthritis of both hips 02/16/2019  . Dyspnea on exertion 12/04/2018  .  CAD S/P multiple PCI's 08/09/2017  . Prolonged QT interval 05/14/2017  . Wide-complex tachycardia (HCC) 05/14/2017  . GERD (gastroesophageal  reflux disease) 05/13/2017  . Hypothyroidism   . Bilateral carpal tunnel syndrome 04/08/2017  . Chronic diastolic CHF (congestive heart failure) (HCC) 03/09/2017  . Erectile dysfunction 03/02/2017  . OSA on CPAP 07/06/2016  . Morbid obesity (HCC) 07/06/2016  . DM type 2 with diabetic peripheral neuropathy (HCC) 12/16/2015  . CAD (coronary artery disease) 09/30/2015  . Essential hypertension 09/16/2015  . Hyperlipidemia    PCP:  Marcine Matar, MD Pharmacy:   [facility] [place], [place] - [address] [place] [address] Phone: [phone] Fax: [phone]  [facility] [place], [place] - [address] [place] [address] Phone: [phone] Fax: [phone]     Social Determinants of Health (SDOH) Interventions    Readmission Risk Interventions No flowsheet data found.

## 2019-03-28 NOTE — Research (Signed)
AEGIS II Informed Consent   Subject Name: Marc Walker  Subject met inclusion and exclusion criteria.  The informed consent form, study requirements and expectations were reviewed with the subject and questions and concerns were addressed prior to the signing of the consent form.  The subject verbalized understanding of the trail requirements.  The subject agreed to participate in the AEGIS II trial and signed the informed consent.  The informed consent was obtained prior to performance of any protocol-specific procedures for the subject.  A copy of the signed informed consent was given to the subject and a copy was placed in the subject's medical record.  Marc Walker D 03/28/2019, 1255PM

## 2019-03-28 NOTE — Research (Signed)
Spoke with patient about AEGIS trial, answered questions and left pamphlet and ICF. Will go back up and see if he wants to participate.

## 2019-03-29 LAB — CBC
HCT: 37.4 % — ABNORMAL LOW (ref 39.0–52.0)
Hemoglobin: 12.2 g/dL — ABNORMAL LOW (ref 13.0–17.0)
MCH: 27.2 pg (ref 26.0–34.0)
MCHC: 32.6 g/dL (ref 30.0–36.0)
MCV: 83.5 fL (ref 80.0–100.0)
Platelets: 200 10*3/uL (ref 150–400)
RBC: 4.48 MIL/uL (ref 4.22–5.81)
RDW: 15.9 % — ABNORMAL HIGH (ref 11.5–15.5)
WBC: 8.1 10*3/uL (ref 4.0–10.5)
nRBC: 0 % (ref 0.0–0.2)

## 2019-03-29 LAB — BASIC METABOLIC PANEL
Anion gap: 12 (ref 5–15)
BUN: 12 mg/dL (ref 6–20)
CO2: 15 mmol/L — ABNORMAL LOW (ref 22–32)
Calcium: 8.4 mg/dL — ABNORMAL LOW (ref 8.9–10.3)
Chloride: 106 mmol/L (ref 98–111)
Creatinine, Ser: 0.78 mg/dL (ref 0.61–1.24)
GFR calc Af Amer: >60 mL/min (ref >60)
GFR calc non Af Amer: >60 mL/min (ref >60)
Glucose, Bld: 196 mg/dL — ABNORMAL HIGH (ref 70–99)
Potassium: 4 mmol/L (ref 3.5–5.1)
Sodium: 133 mmol/L — ABNORMAL LOW (ref 135–145)

## 2019-03-29 LAB — GLUCOSE, CAPILLARY
Glucose-Capillary: 235 mg/dL — ABNORMAL HIGH (ref 70–99)
Glucose-Capillary: 238 mg/dL — ABNORMAL HIGH (ref 70–99)

## 2019-03-29 MED ORDER — TICAGRELOR 90 MG PO TABS: 90.0000 mg | ORAL_TABLET | Freq: Two times a day (BID) | ORAL | 3 refills | Status: DC

## 2019-03-29 MED ORDER — LOSARTAN POTASSIUM 25 MG PO TABS
25.0000 mg | ORAL_TABLET | Freq: Every day | ORAL | Status: DC
  Administered 2019-03-29: 25 mg via ORAL
  Filled 2019-03-29: qty 1

## 2019-03-29 MED ORDER — LOSARTAN POTASSIUM 25 MG PO TABS: 25.0000 mg | ORAL_TABLET | Freq: Every day | ORAL | 2 refills | Status: DC

## 2019-03-29 MED ORDER — FUROSEMIDE 40 MG PO TABS: 40.0000 mg | ORAL_TABLET | Freq: Two times a day (BID) | ORAL | Status: DC

## 2019-03-29 MED ORDER — NITROGLYCERIN 0.4 MG SL SUBL: 0.4000 mg | SUBLINGUAL_TABLET | SUBLINGUAL | 0 refills | Status: DC | PRN

## 2019-03-29 MED ORDER — FENOFIBRATE 160 MG PO TABS: 160.0000 mg | ORAL_TABLET | Freq: Every day | ORAL | 2 refills | Status: AC

## 2019-03-29 MED FILL — SILDENAFIL CITRATE 50 MG TA: 50 | 30 days supply | Qty: 10 | Fill #1

## 2019-03-29 MED FILL — TRUEplus LANCETS 28G MISC: 30 days supply | Qty: 100 | Fill #2

## 2019-03-29 MED FILL — NITROGLYCERIN 0.4 MG TAB SL: 0.4 | 8 days supply | Qty: 25 | Fill #0

## 2019-03-29 MED FILL — ?METOPROLOL 50 MG TABLET: 50 | 30 days supply | Qty: 60 | Fill #2

## 2019-03-29 MED FILL — PANTOPRAZOLE SOD DR 40 MG T: 40 | 30 days supply | Qty: 30 | Fill #1

## 2019-03-29 MED FILL — ?ROSUVASTATIN CALCIUM 40MG: 40 | 30 days supply | Qty: 30 | Fill #2

## 2019-03-29 MED FILL — METFORMIN HCL ER 500 MG TB2: 500 | 30 days supply | Qty: 120 | Fill #7

## 2019-03-29 MED FILL — ?LEVOTHYROXINE 200 MCG TAB: 200 MCG | 30 days supply | Qty: 30 | Fill #2

## 2019-03-29 MED FILL — TRUE METRIX TEST STRIP: 25 days supply | Qty: 100 | Fill #6

## 2019-03-29 MED FILL — LOSARTAN POTASSIUM 25 MG TA: 25 | 30 days supply | Qty: 60 | Fill #0

## 2019-03-29 MED FILL — ?FENOFIBRATE 145MG TABLET: 145 | 30 days supply | Qty: 30 | Fill #1

## 2019-03-29 MED FILL — BRILINTA 90 MG TABLET: 90 | 30 days supply | Qty: 60 | Fill #0

## 2019-03-29 MED FILL — FENOFIBRATE 160 MG TABLET: 160 | 30 days supply | Qty: 60 | Fill #0

## 2019-03-29 NOTE — Progress Notes (Signed)
Progress Note  Patient Name: Marc Walker Date of Encounter: 03/29/2019  Primary Cardiologist: Dietrich PatesPaula Ross, MD   Subjective   Continues to have significant testicular swelling.  Vascular ultrasound negative for pseudoaneurysm with evidence of small hematoma.  Denies recurrent chest pain, back or jaw pain.  No shortness of breath.  Inpatient Medications    Scheduled Meds: . aspirin  81 mg Oral Daily  . ezetimibe  10 mg Oral Daily  . fenofibrate  160 mg Oral Daily  . insulin aspart  0-20 Units Subcutaneous TID WC  . insulin glargine  36 Units Subcutaneous BID  . levothyroxine  200 mcg Oral Q0600  . metoprolol tartrate  50 mg Oral BID  . pantoprazole  40 mg Oral Daily  . rosuvastatin  40 mg Oral Daily  . sodium chloride flush  3 mL Intravenous Q12H  . sodium chloride flush  3 mL Intravenous Q12H  . ticagrelor  90 mg Oral BID   Continuous Infusions: . sodium chloride Stopped (03/25/19 2129)  . sodium chloride    . nitroGLYCERIN 10 mcg/min (03/27/19 0038)   PRN Meds: sodium chloride, sodium chloride, acetaminophen, nitroGLYCERIN, sodium chloride flush   Vital Signs    Vitals:   03/28/19 1300 03/28/19 1400 03/28/19 2200 03/29/19 0655  BP: 136/76 131/69 (!) 141/70 134/74  Pulse: 72 69 74 78  Resp: 18 17 18 16   Temp: 98.2 F (36.8 C) 98.4 F (36.9 C) 98.7 F (37.1 C) 98.4 F (36.9 C)  TempSrc: Oral Oral Oral Oral  SpO2: 97% 98% 97% 100%  Weight:    (!) 139.8 kg  Height:        Intake/Output Summary (Last 24 hours) at 03/29/2019 0830 Last data filed at 03/28/2019 2200 Gross per 24 hour  Intake 2346 ml  Output 901 ml  Net 1445 ml   Filed Weights   03/27/19 0521 03/28/19 0306 03/29/19 0655  Weight: (!) 138.8 kg (!) 139.1 kg (!) 139.8 kg    Physical Exam   General: Obese, NAD Neck: Negative for carotid bruits. No JVD Lungs:Clear to ausculation bilaterally. No wheezes Cardiovascular: RRR with S1 S2. No murmur. Groin site with significant hematoma  extending and to bilateral testicles Abdomen: Soft, obese. No obvious abdominal masses. Extremities: Mild 1+ edema. No clubbing or cyanosis. DP pulses 2+ bilaterally Neuro: Alert and oriented. No focal deficits. No facial asymmetry. MAE spontaneously. Psych: Responds to questions appropriately with normal affect.    Labs    Chemistry Recent Labs  Lab 03/27/19 0722 03/28/19 0711 03/28/19 1245 03/29/19 0319  NA 135 133*  --  133*  K 4.1 3.8  --  4.0  CL 100 102  --  106  CO2 24 20*  --  15*  GLUCOSE 337* 271*  --  196*  BUN 16 11  --  12  CREATININE 0.89 0.81  --  0.78  CALCIUM 8.8* 8.4*  --  8.4*  PROT  --   --  6.4*  --   ALBUMIN  --   --  3.2*  --   AST  --   --  36  --   ALT  --   --  44  --   ALKPHOS  --   --  53  --   BILITOT  --   --  0.9  --   GFRNONAA >60 >60  --  >60  GFRAA >60 >60  --  >60  ANIONGAP 11 11  --  12  Hematology Recent Labs  Lab 03/27/19 0722 03/28/19 0711 03/29/19 0549  WBC 8.7 7.8 8.1  RBC 5.00 4.86 4.48  HGB 13.5 12.9* 12.2*  HCT 41.9 41.3 37.4*  MCV 83.8 85.0 83.5  MCH 27.0 26.5 27.2  MCHC 32.2 31.2 32.6  RDW 15.9* 15.9* 15.9*  PLT 242 214 200    Cardiac EnzymesNo results for input(s): TROPONINI in the last 168 hours. No results for input(s): TROPIPOC in the last 168 hours.   BNPNo results for input(s): BNP, PROBNP in the last 168 hours.   DDimer No results for input(s): DDIMER in the last 168 hours.   Radiology    Vas [place] Lower Extremity Arterial Duplex  Result Date: 03/28/2019 LOWER EXTREMITY ARTERIAL DUPLEX STUDY Indications: Post right femoral cath rule out pseudoaneurysm. Other Factors: Bruising, pain right groin.  Current ABI: N/A Performing Technologist: June Leap RDMS, RVT  Examination Guidelines: A complete evaluation includes B-mode imaging, spectral Doppler, color Doppler, and power Doppler as needed of all accessible portions of each vessel. Bilateral testing is considered an integral part of a complete  examination. Limited examinations for reoccurring indications may be performed as noted.  +--------+--------+-----+--------+---------+--------+ RIGHT   PSV cm/sRatioStenosisWaveform Comments +--------+--------+-----+--------+---------+--------+ CFA Prox                     triphasic         +--------+--------+-----+--------+---------+--------+ Right CFV patent.  Summary: Right: Right groin: No evidence of pseudoaneurysm or AV fistula. Hematoma is noted measuring 1.92 cm.  See table(s) above for measurements and observations. Electronically signed by Deitra Mayo MD on 03/28/2019 at 4:27:33 PM.    Final    Telemetry    03/29/2019 NSR 60s to 80s- Personally Reviewed  ECG    No new tracing as of 03/29/2019- Personally Reviewed  Cardiac Studies   Cardiac Cath 03/27/19  Non-stenotic Ost LAD to Prox LAD lesion was previously treated.  Previously placed Prox LAD to Mid LAD stent (unknown type) is widely patent.  2nd Diag lesion is 90% stenosed.  Mid Cx lesion is 70% stenosed. This is a small vessel after the origin of the large OM.  Non-stenotic Ost 2nd Mrg to 2nd Mrg lesion was previously treated.  Ost RPDA to RPDA lesion is 20% stenosed.  Mid LAD to Dist LAD lesion is 10% stenosed.  Previously placed 1st Mrg-1 stent (unknown type) is widely patent.  1st Mrg-2 lesion is 50% stenosed.  Mid RCA-1 lesion is 95% stenosed.  A drug-eluting stent was successfully placed using a STENT SYNERGY DES 3.5X16.  Post intervention, there is a 0% residual stenosis.  Previously placed Mid RCA-2 stent (unknown type) is widely patent.  RPAV-1 lesion is 80% stenosed.  A drug-eluting stent was successfully placed using a STENT SYNERGY DES 2.25X12.  Post intervention, there is a 0% residual stenosis.  RPAV-2 lesion is 50% stenosed. THese is diffuse small vessel disease in the distal vasculature.  Prox RCA lesion is 20% stenosed.  3rd RPL lesion is 10% stenosed.  Mid RCA to  Dist RCA lesion is 20% stenosed.  The left ventricular systolic function is normal.  LV end diastolic pressure is normal.  The left ventricular ejection fraction is 55-65% by visual estimate.  There is no aortic valve stenosis.  Continue aggressive secondary prevention. I stressed the importance of DAPT for 1 year. He may need to change from Brilinta to Plavix after 1 month. Would consider lifelong clopidogrel monotherapy after 1 year given his diffuse CAD.  Coronary  Diagrams  Diagnostic Dominance: Right  Intervention    Patient Profile     48 y.o. male with extensive CAD, prior MIs and multiple PCIs/stents, DM, HTN, HLD, obesity, former smoker, and noncompliance transferred from [facility] for NSTEMI  Assessment & Plan    1. NSTEMI/CAD s/p multiple PCIs:  -Cath performed 03/28/2019 showed diffuse CAD in which a PCI to mid RCA and RPAV-1 was placed. Post cath recommendations for change to Brilinta from plavix after 1 month with possible lifelong monotherapy with clopidogrel given diffuse CAD.  -Echo with normal LVEF  -Vascular duplex performed given significant bruising at cath site that showed no evidence of pseudoaneurysm or AV fistula. Hematoma is noted measuring 1.92 cm. -Continue BB -Will add low dose losartan today  -NTG at discharge -Denies recurrent anginal symptoms  2. Chronic diastolic HF: -Echo performed 03/28/2019 with normal LVEF -Has some lower leg edema on exam >> continue lasix at discharge (was on 80 mg 2 times daily, but reported taking it 3-4 times weekly) -Will restart today at 40 mg twice daily>>reports taking 80 mg 3 times weekly at home -Continue BB  3. Uncontrolled DM2: -A1C 12.4 -Reported being off diabetic medication for some time -Will need close follow-up with PCP and work on diet/lifestyle changes  4. HTN: -Stable, 134/74, 141/70 -Lopressor 50 mg BID home med -Start low-dose losartan   5.  Social Issues/noncompliance: -Long  history of medication noncompliance secondary to financial reasons -Case manager on board to help with Brilinta -After 1 month, may need to be changed to Plavix therapy  6.  HLD:  -On Crestor and Zetia -Fenofibrate restarted -TG 769, HDL 30 -LDL unable to be calculated -If unresponsive, may need lipid clinic referral for possible PCSK9 inhibitor   Signed, Georgie Chard NP-C [facility] Pager: [phone] 03/29/2019, 8:30 AM     For questions or updates, please contact   Please consult [email or web address] for contact info under Cardiology/STEMI.

## 2019-03-29 NOTE — TOC Transition Note (Signed)
Transition of Care [facility]) - CM/SW Discharge Note   Patient Details  Name: Marc Walker MRN: [id number] Date of Birth: 23-Feb-1971  [facility] CM/SW Contact:  Bethena Roys, RN Phone Number: 03/29/2019, 12:54 PM   Clinical Narrative:   MATCH completed for medication assistance. Medications will be delivered to room prior to transition home. No further needs from CM at this time.   Final next level of care: Home/Self Care Barriers to Discharge: No Barriers Identified   Patient Goals and CMS Choice Patient states their goals for this hospitalization and ongoing recovery are:: "to return home"   Choice offered to / list presented to : NA    Discharge Plan and Services In-house Referral: NA Discharge Planning Services: [facility] Program Post Acute Care Choice: NA            HH Arranged: NA     Readmission Risk Interventions No flowsheet data found.

## 2019-03-29 NOTE — Discharge Summary (Addendum)
Discharge Summary    Patient ID: Marc Walker MRN: [id number]; DOB: 1970-11-19  Admit date: 03/25/2019 Discharge date: 03/29/2019  Primary Care Provider: [name], [name], MD  Primary Cardiologist: Dietrich PatesPaula Ross, MD   Discharge Diagnoses    Principal Problem:   NSTEMI (non-ST elevated myocardial infarction) [facility](HCC) Active Problems:   Hyperlipidemia   DM type 2 with diabetic peripheral neuropathy (HCC)   Morbid obesity (HCC)   Chronic diastolic CHF (congestive heart failure) (HCC)   Hypothyroidism   Prolonged QT interval   CAD S/P multiple PCI's. patient reported 9 previous stents; 9/20 DES to RPAV-1 and DES to mid RCA -1; 3/17 DES to LAD and DES to mid RCA along with Angiosculpt balloon angioplas   ACS (acute coronary syndrome) (HCC)  Allergies Allergies  Allergen Reactions  . 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  Pt only wants pain medicine if absolutely necessary.  . Levofloxacin Rash    Rash on back (not sun exposed)and hypersensitivity to skin on exposed skin/arm  . Nsaids Other (See Comments)    Avoid due to CKD   Diagnostic Studies/Procedures    Cardiac Cath 03/27/19  Non-stenotic Ost LAD to Prox LAD lesion was previously treated.  Previously placed Prox LAD to Mid LAD stent (unknown type) is widely patent.  2nd Diag lesion is 90% stenosed.  Mid Cx lesion is 70% stenosed. This is a small vessel after the origin of the large OM.  Non-stenotic Ost 2nd Mrg to 2nd Mrg lesion was previously treated.  Ost RPDA to RPDA lesion is 20% stenosed.  Mid LAD to Dist LAD lesion is 10% stenosed.  Previously placed 1st Mrg-1 stent (unknown type) is widely patent.  1st Mrg-2 lesion is 50% stenosed.  Mid RCA-1 lesion is 95% stenosed.  A drug-eluting stent was successfully placed using a STENT SYNERGY DES 3.5X16.  Post intervention, there is a 0% residual stenosis.  Previously placed Mid RCA-2 stent (unknown type) is widely  patent.  RPAV-1 lesion is 80% stenosed.  A drug-eluting stent was successfully placed using a STENT SYNERGY DES 2.25X12.  Post intervention, there is a 0% residual stenosis.  RPAV-2 lesion is 50% stenosed. THese is diffuse small vessel disease in the distal vasculature.  Prox RCA lesion is 20% stenosed.  3rd RPL lesion is 10% stenosed.  Mid RCA to Dist RCA lesion is 20% stenosed.  The left ventricular systolic function is normal.  LV end diastolic pressure is normal.  The left ventricular ejection fraction is 55-65% by visual estimate.  There is no aortic valve stenosis.  Continue aggressive secondary prevention. I stressed the importance of DAPT for 1 year. He may need to change from Brilinta to Plavix after 1 month. Would consider lifelong clopidogrel monotherapy after 1 year given his diffuse CAD.  Coronary Diagrams  Diagnostic Dominance: Right    Echocardiogram 03/28/2019:   1. Left ventricular ejection fraction, by visual estimation, is 60 to 65%. The left ventricle has normal function. Normal left ventricular size. There is mildly increased left ventricular hypertrophy.  2. Global right ventricle has normal systolic function.The right ventricular size is normal.  3. Left atrial size was normal.  4. Right atrial size was normal.  5. The mitral valve is normal in structure. No evidence of mitral valve regurgitation. No evidence of mitral stenosis.  6. The tricuspid valve is normal in structure. Tricuspid valve regurgitation was not visualized by color flow Doppler.  7. The  aortic valve is tricuspid Aortic valve regurgitation was not visualized by color flow Doppler. Structurally normal aortic valve, with no evidence of sclerosis or stenosis.  8. The pulmonic valve was not well visualized. Pulmonic valve regurgitation is not visualized by color flow Doppler.  9. The inferior vena cava is normal in size with greater than 50% respiratory variability, suggesting right  atrial pressure of 3 mmHg. 10. Technically difficult; definity used; normal LV function; no significant valvular abnormality.  History of Present Illness     Rease Wence a 48 y.o.malewith CAD s/pprior myocardial infarction, s/p multipleprior stents placedat outside institutions, DM, HTN, HLD with hypertriglyceridemia, tobacco abuse, OSA, diastolic HF, obesity and former smoker, quit 2 years ago. He was admitted to [facility] in 3/17 with unstable angina. LHC demonstrated 80% mid RCA stenosis treated with DES, 90% proximal LAD and 70% mid LAD stenosis involving the previous stent treated with DES, 20% RPDA in-stent restenosis treated with angioplasty. The stent in OM2 was patent. Residual disease included 90% D2 stenosis and 70% mid LCx stenosis, treated medically.  He then presented to [facility] on 03/25/2019 w/ CC of anterior chest, bilateral arm and jaw pain. Symptoms began 4 days prior to presentation. He reported that his symptoms would occur with rest and worse w/ exertion. He had no associated dyspnea. He did not use SL NTG at home. Initially he thought it was muscle pain but symptoms kept occurring and were progressing which prompted him to come to the ED.   In the ED,  initial Hs troponin was abnormal at 164. 2nd troponin increased to 554. Peak troponin was 4315. Pt was placed on IV heparin and NTG, given 325 of ASA and transferred to [facility] for further care/ management of NSTEMI.   Hospital Course    Plan was for cardiac catheterization which was performed 03/27/2019 that showed patent prior stents with new mRCA 95% stenosis. PCI/DES placed successfully. There was also a RPAV-1 that was noted to be 80% stenosed with PCI/DES placement. LVEF noted to be 55-65% by visual estimate with follow up echocardiogram completed. Echo showed EF to be 60-65% with LVH and no valvular disease.   He did well in the post cath setting. He had no recurrent anginal symptoms. Plan was for discharge  on 09/29 however patient found to have significant groin and testicular swelling. Vascular duplex completed without evidence of pseudoaneurysm or AV fistula. Hematoma noted to measure 1.92 cm. Area marked 09/29 with no significant growth to 09/30. Instructions to ice testicles and groin area with ice packs and attempt elevation. Sling ordered per nursing staff. No drop in Hb. Can take Tylenol for pain after discharge.   The patient has walked with cardiac rehabilitation without difficulty. Pt has follow up appointment scheduled.   New medications include: Brilinta, Losartan and we increased his Fenofibrate to 160. Lasix was resumed at discharge however he is fairly non-compliant with his dosing. He has no s/s for fluid volume overload on day of discharge.   Other hospital problems include:  1. NSTEMI/CAD s/pmultiple PCIs: -Cath performed 03/28/2019 showed diffuse CAD in which a PCI to mid RCA and RPAV-1 was placed. Post cath recommendations for change to Brilinta from plavix after 1 month with possible lifelong monotherapy with clopidogrel given diffuse CAD. -Echo with normal LVEF  -Vascular duplex performed given significant bruising at cath site that showed no evidence of pseudoaneurysm or AV fistula. Hematoma is noted measuring 1.92 cm. -Continue BB -Will add low dose losartan  -NTG  at discharge -Denies recurrent anginal symptoms  2. Chronic diastolic HF: -Echo performed 03/28/2019 with normal LVEF -Has some lower leg edema on exam >>continue lasix at discharge (was on 80 mg 2 times daily, but reported taking it 3-4 times weekly) -Will restart today at 40 mg twice daily>>reports taking 80 mg 3 times weekly at home -Continue BB  3. Uncontrolled DM2: -A1C 12.4 -Reported being off diabetic medication for some time -Will need close follow-up with PCP and work on diet/lifestyle changes -Needs enforcement on compliance   4. HTN: -Stable, 134/74, 141/70 -Lopressor 50 mg BID home  med -Start low-dose losartan   5.  Social Issues/noncompliance: -Long history of medication noncompliance secondary to financial reasons -Case manageron boardto help with Brilinta -After 1 month, may need to be changed to Plavix therapy  6.  HLD:  -On Crestor and Zetia -Fenofibrate restarted -TG 769, HDL 30 -LDL unable to be calculated -If unresponsive, may need lipid clinic referral for possible PCSK9 inhibitor  Consultants: None   The patient was seen and examined by Dr. Bjorn Pippin who feels that he is stable and ready for discharge today, 03/29/2019.  _____________  Discharge Vitals Blood pressure 134/74, pulse 78, temperature 98.4 F (36.9 C), temperature source Oral, resp. rate 16, height 5' 5.5" (1.664 m), weight (!) 139.8 kg, SpO2 100 %.  Filed Weights   03/27/19 0521 03/28/19 0306 03/29/19 0655  Weight: (!) 138.8 kg (!) 139.1 kg (!) 139.8 kg   Labs & Radiologic Studies    CBC Recent Labs    03/28/19 0711 03/29/19 0549  WBC 7.8 8.1  HGB 12.9* 12.2*  HCT 41.3 37.4*  MCV 85.0 83.5  PLT 214 200   Basic Metabolic Panel Recent Labs    12/27/14 0711 03/29/19 0319  NA 133* 133*  K 3.8 4.0  CL 102 106  CO2 20* 15*  GLUCOSE 271* 196*  BUN 11 12  CREATININE 0.81 0.78  CALCIUM 8.4* 8.4*   Liver Function Tests Recent Labs    03/28/19 1245  AST 36  ALT 44  ALKPHOS 53  BILITOT 0.9  PROT 6.4*  ALBUMIN 3.2*   High Sensitivity Troponin:   Recent Labs  Lab 03/25/19 1116 03/25/19 1312 03/25/19 1940 03/25/19 2157  TROPONINIHS 164* 554* 4,315* 4,128*    _____________  Dg Chest 2 View  Result Date: 03/25/2019 CLINICAL DATA:  Chest pain EXAM: CHEST - 2 VIEW COMPARISON:  12/08/2018 FINDINGS: Cardiomegaly. Both lungs are clear. Unchanged pleural thickening about the lateral left lower lung. The visualized skeletal structures are unremarkable. IMPRESSION: Cardiomegaly without acute abnormality of the lungs. Electronically Signed   By: Lauralyn Primes M.D.    On: 03/25/2019 12:12   Vas [place] Lower Extremity Arterial Duplex  Result Date: 03/28/2019 LOWER EXTREMITY ARTERIAL DUPLEX STUDY Indications: Post right femoral cath rule out pseudoaneurysm. Other Factors: Bruising, pain right groin.  Current ABI: N/A Performing Technologist: Jeb Levering RDMS, RVT  Examination Guidelines: A complete evaluation includes B-mode imaging, spectral Doppler, color Doppler, and power Doppler as needed of all accessible portions of each vessel. Bilateral testing is considered an integral part of a complete examination. Limited examinations for reoccurring indications may be performed as noted.  +--------+--------+-----+--------+---------+--------+ RIGHT   PSV cm/sRatioStenosisWaveform Comments +--------+--------+-----+--------+---------+--------+ CFA Prox                     triphasic         +--------+--------+-----+--------+---------+--------+ Right CFV patent.  Summary: Right: Right groin: No evidence of pseudoaneurysm  or AV fistula. Hematoma is noted measuring 1.92 cm.  See table(s) above for measurements and observations. Electronically signed by Waverly Ferrari MD on 03/28/2019 at 4:27:33 PM.    Final    Disposition   Pt is being discharged home today in good condition.  Follow-up Plans & Appointments   Follow-up Information    [facility]. Go on 04/06/2019.   Why: @ 9:30am Contact information: [address] [place] [address] [phone]       Berton Bon, NP Follow up on 04/14/2019.   Specialty: Cardiology Why: Please go to hospital follow up October 16 at 2:00 PM Contact information: [address] [place] [address] [phone]          Discharge Instructions    Amb Referral to Cardiac Rehabilitation   Complete by: As directed    Referring to [place] CRP 2   Diagnosis:  NSTEMI Coronary Stents     After initial evaluation and assessments completed: Virtual  Based Care may be provided alone or in conjunction with Phase 2 Cardiac Rehab based on patient barriers.: Yes   Call MD for:  difficulty breathing, headache or visual disturbances   Complete by: As directed    Call MD for:  extreme fatigue   Complete by: As directed    Call MD for:  hives   Complete by: As directed    Call MD for:  persistant dizziness or light-headedness   Complete by: As directed    Call MD for:  persistant nausea and vomiting   Complete by: As directed    Call MD for:  redness, tenderness, or signs of infection (pain, swelling, redness, odor or green/yellow discharge around incision site)   Complete by: As directed    Call MD for:  severe uncontrolled pain   Complete by: As directed    Call MD for:  temperature >100.4   Complete by: As directed    Diet - low sodium heart healthy   Complete by: As directed    Discharge instructions   Complete by: As directed    PLEASE DO NOT MISS ANY DOSES OF YOUR BRILINTA!!!!! Also keep a log of you blood pressures and bring back to your follow up appt. Please call the office with any questions.   Patients taking blood thinners should generally stay away from medicines like ibuprofen, Advil, Motrin, naproxen, and Aleve due to risk of stomach bleeding. You may take Tylenol as directed or talk to your primary doctor about alternatives.  Some studies suggest Prilosec/Omeprazole interacts with Plavix. Please use Protonix if you have reflux symptoms.   Please DO NOT use nitroglycerin and Viagra at the same time. It will cause significant low blood pressure.   Please ice your groin and testicular area and keep the areas as elevated as possible. You can take Tylenol for discomfort but please refrain from NSAIDS like Ibprofen or Aleve as they increase bleeding risk.   Increase activity slowly   Complete by: As directed      Discharge Medications   Allergies as of 03/29/2019      Reactions   Other Other (See Comments)   Pt is a  recovering drug addict for 24 years 9 months.  Pt only wants pain medicine if absolutely necessary.   Levofloxacin Rash   Rash on back (not sun exposed)and hypersensitivity to skin on exposed skin/arm   Nsaids Other (See Comments)   Avoid due to CKD  Medication List    STOP taking these medications   clopidogrel 75 MG tablet Commonly known as: PLAVIX     TAKE these medications   aspirin 81 MG tablet Take 1 tablet (81 mg total) by mouth daily.   empagliflozin 10 MG Tabs tablet Commonly known as: Jardiance Take 10 mg by mouth daily.   ezetimibe 10 MG tablet Commonly known as: ZETIA Take 1 tablet (10 mg total) by mouth daily.   famotidine 40 MG tablet Commonly known as: Pepcid Take 1 tablet (40 mg total) by mouth at bedtime. What changed: when to take this   fenofibrate 160 MG tablet Take 1 tablet (160 mg total) by mouth daily. Start taking on: March 30, 2019 What changed:   medication strength  how much to take   furosemide 80 MG tablet Commonly known as: LASIX TAKE 1 TABLET 2 TIMES DAILY BY MOUTH. What changed:   how much to take  how to take this  when to take this  additional instructions   glucose blood test strip Use as instructed   insulin lispro 100 UNIT/ML KwikPen Commonly known as: HumaLOG KwikPen Inject 0.38 mLs (38 Units total) into the skin 3 (three) times daily. Take with meals What changed:   when to take this  additional instructions   Insulin Syringes (Disposable) U-100 0.5 ML Misc Use as directed   Lantus SoloStar 100 UNIT/ML Solostar Pen Generic drug: Insulin Glargine Inject 72 Units into the skin 2 (two) times daily.   levothyroxine 200 MCG tablet Commonly known as: SYNTHROID TAKE 1 TABLET (200 MCG TOTAL) BY MOUTH DAILY. (DOSE INCREASE) What changed:   how much to take  how to take this  when to take this  additional instructions   losartan 25 MG tablet Commonly known as: COZAAR Take 1 tablet (25 mg total) by  mouth daily.   metFORMIN 500 MG 24 hr tablet Commonly known as: GLUCOPHAGE-XR Take 1 tablet (500 mg total) by mouth 2 (two) times daily.   metoprolol tartrate 50 MG tablet Commonly known as: LOPRESSOR Take 1 tablet (50 mg total) by mouth 2 (two) times daily.   mupirocin ointment 2 % Commonly known as: Bactroban Place 1 application into the nose 2 (two) times daily.   nitroGLYCERIN 0.4 MG SL tablet Commonly known as: NITROSTAT Place 1 tablet (0.4 mg total) under the tongue every 5 (five) minutes x 3 doses as needed for chest pain.   pantoprazole 40 MG tablet Commonly known as: Protonix Take 1 tablet (40 mg total) by mouth daily.   Pen Needles 31G X 6 MM Misc Use as directed   PRESCRIPTION MEDICATION CPAP- At bedtime   rosuvastatin 40 MG tablet Commonly known as: CRESTOR Take 1 tablet (40 mg total) by mouth daily.   sildenafil 50 MG tablet Commonly known as: Viagra TAKE 1 TABLET (50 MG TOTAL) BY MOUTH DAILY AS NEEDED FOR ERECTILE DYSFUNCTION. What changed:   how much to take  how to take this  when to take this  additional instructions   ticagrelor 90 MG Tabs tablet Commonly known as: BRILINTA Take 1 tablet (90 mg total) by mouth 2 (two) times daily.   triamcinolone cream 0.1 % Commonly known as: KENALOG Apply 1 application topically at bedtime. To right lower leg   TRUEplus Lancets 28G Misc Use as directed            Durable Medical Equipment  (From admission, onward)         Start  Ordered   03/28/19 1217  For home use only DME Cane  Once     03/28/19 1218           Acute coronary syndrome (MI, NSTEMI, STEMI, etc) this admission?: Yes.     AHA/ACC Clinical Performance & Quality Measures: 1. Aspirin prescribed? - Yes 2. ADP Receptor Inhibitor (Plavix/Clopidogrel, Brilinta/Ticagrelor or Effient/Prasugrel) prescribed (includes medically managed patients)? - Yes 3. Beta Blocker prescribed? - Yes 4. High Intensity Statin (Lipitor 40-80mg   or Crestor 20-40mg ) prescribed? - Yes 5. EF assessed during THIS hospitalization? - Yes 6. For EF <40%, was ACEI/ARB prescribed? - Not Applicable (EF >/= 03%) 7. For EF <40%, Aldosterone Antagonist (Spironolactone or Eplerenone) prescribed? - Not Applicable (EF >/= 00%) 8. Cardiac Rehab Phase II ordered (Included Medically managed Patients)? - Yes    Outstanding Labs/Studies   BMET   Duration of Discharge Encounter   Greater than 30 minutes including physician time.  Signed, Kathyrn Drown, NP 03/29/2019, 10:59 AM

## 2019-03-29 NOTE — Progress Notes (Signed)
CARDIAC REHAB PHASE I   PRE:  Rate/Rhythm: 82 SR  BP:  Supine:   Sitting: 122/80  Standing:    SaO2: 98%RA  MODE:  Ambulation: 420 ft   POST:  Rate/Rhythm: 104 ST  BP:  Supine:   Sitting: 136/80  Standing:    SaO2: 98%RA 0930-0956 Pt walked 420 ft on RA with cane. Stopped frequently due to scrotal swelling. No CP. Pt stated he would like rollator now as he thinks it would help him increase distance.    Graylon Good, RN BSN  03/29/2019 9:55 AM

## 2019-03-30 MED FILL — CLOPIDOGREL 75 MG TABLET: 75 | 30 days supply | Qty: 30 | Fill #2

## 2019-03-31 ENCOUNTER — Encounter: Payer: Self-pay | Admitting: Internal Medicine

## 2019-03-31 NOTE — Research (Signed)
V2  Pt doing well, after infusion. Will see back next Tuesday for infusion 2                                     "CONSENT"   YES     NO   Continuing further Investigational Product and study visits for follow-up? [x]  []   Continuing consent from future biomedical research [x]  []                                    "EVENTS"    YES     NO  AE   (IF YES SEE SOURCE) []  [x]   SAE  (IF YES SEE SOURCE) []  [x]   ENDPOINT   (IF YES SEE SOURCE) []  [x]   REVASCULARIZATION  (IF YES SEE SOURCE) []  [x]   AMPUTATION   (IF YES SEE SOURCE) []  [x]   TROPONIN'S  (IF YES SEE SOURCE) []  [x] 

## 2019-04-02 ENCOUNTER — Other Ambulatory Visit: Payer: Self-pay

## 2019-04-02 ENCOUNTER — Emergency Department (HOSPITAL_BASED_OUTPATIENT_CLINIC_OR_DEPARTMENT_OTHER): Payer: Self-pay

## 2019-04-02 ENCOUNTER — Emergency Department (HOSPITAL_BASED_OUTPATIENT_CLINIC_OR_DEPARTMENT_OTHER)
Admission: EM | Admit: 2019-04-02 | Discharge: 2019-04-02 | Disposition: A | Discharge status: Home / Self Care | Payer: Self-pay | Attending: Emergency Medicine | Admitting: Emergency Medicine

## 2019-04-02 ENCOUNTER — Encounter (HOSPITAL_BASED_OUTPATIENT_CLINIC_OR_DEPARTMENT_OTHER): Payer: Self-pay | Admitting: Emergency Medicine

## 2019-04-02 DIAGNOSIS — I97630 Postprocedural hematoma of a circulatory system organ or structure following a cardiac catheterization: Secondary | ICD-10-CM | POA: Insufficient documentation

## 2019-04-02 DIAGNOSIS — Z79899 Other long term (current) drug therapy: Secondary | ICD-10-CM | POA: Insufficient documentation

## 2019-04-02 DIAGNOSIS — I2510 Atherosclerotic heart disease of native coronary artery without angina pectoris: Secondary | ICD-10-CM | POA: Insufficient documentation

## 2019-04-02 DIAGNOSIS — Z7982 Long term (current) use of aspirin: Secondary | ICD-10-CM | POA: Insufficient documentation

## 2019-04-02 DIAGNOSIS — I252 Old myocardial infarction: Secondary | ICD-10-CM | POA: Insufficient documentation

## 2019-04-02 DIAGNOSIS — R1909 Other intra-abdominal and pelvic swelling, mass and lump: Secondary | ICD-10-CM | POA: Insufficient documentation

## 2019-04-02 DIAGNOSIS — N189 Chronic kidney disease, unspecified: Secondary | ICD-10-CM | POA: Insufficient documentation

## 2019-04-02 DIAGNOSIS — I5032 Chronic diastolic (congestive) heart failure: Secondary | ICD-10-CM | POA: Insufficient documentation

## 2019-04-02 DIAGNOSIS — Z794 Long term (current) use of insulin: Secondary | ICD-10-CM | POA: Insufficient documentation

## 2019-04-02 DIAGNOSIS — E039 Hypothyroidism, unspecified: Secondary | ICD-10-CM | POA: Insufficient documentation

## 2019-04-02 DIAGNOSIS — E1122 Type 2 diabetes mellitus with diabetic chronic kidney disease: Secondary | ICD-10-CM | POA: Insufficient documentation

## 2019-04-02 DIAGNOSIS — I130 Hypertensive heart and chronic kidney disease with heart failure and stage 1 through stage 4 chronic kidney disease, or unspecified chronic kidney disease: Secondary | ICD-10-CM | POA: Insufficient documentation

## 2019-04-02 DIAGNOSIS — Z87891 Personal history of nicotine dependence: Secondary | ICD-10-CM | POA: Insufficient documentation

## 2019-04-02 LAB — CBC WITH DIFFERENTIAL/PLATELET
Abs Immature Granulocytes: 0.04 10*3/uL (ref 0.00–0.07)
Basophils Absolute: 0.1 10*3/uL (ref 0.0–0.1)
Basophils Relative: 1 %
Eosinophils Absolute: 0.4 10*3/uL (ref 0.0–0.5)
Eosinophils Relative: 5 %
HCT: 39 % (ref 39.0–52.0)
Hemoglobin: 12.3 g/dL — ABNORMAL LOW (ref 13.0–17.0)
Immature Granulocytes: 0 %
Lymphocytes Relative: 21 %
Lymphs Abs: 2 10*3/uL (ref 0.7–4.0)
MCH: 26.6 pg (ref 26.0–34.0)
MCHC: 31.5 g/dL (ref 30.0–36.0)
MCV: 84.2 fL (ref 80.0–100.0)
Monocytes Absolute: 0.7 10*3/uL (ref 0.1–1.0)
Monocytes Relative: 8 %
Neutro Abs: 6 10*3/uL (ref 1.7–7.7)
Neutrophils Relative %: 65 %
Platelets: 311 10*3/uL (ref 150–400)
RBC: 4.63 MIL/uL (ref 4.22–5.81)
RDW: 15.9 % — ABNORMAL HIGH (ref 11.5–15.5)
WBC: 9.3 10*3/uL (ref 4.0–10.5)
nRBC: 0 % (ref 0.0–0.2)

## 2019-04-02 LAB — BASIC METABOLIC PANEL
Anion gap: 12 (ref 5–15)
BUN: 25 mg/dL — ABNORMAL HIGH (ref 6–20)
CO2: 25 mmol/L (ref 22–32)
Calcium: 9.5 mg/dL (ref 8.9–10.3)
Chloride: 96 mmol/L — ABNORMAL LOW (ref 98–111)
Creatinine, Ser: 1.14 mg/dL (ref 0.61–1.24)
GFR calc Af Amer: >60 mL/min (ref >60)
GFR calc non Af Amer: >60 mL/min (ref >60)
Glucose, Bld: 276 mg/dL — ABNORMAL HIGH (ref 70–99)
Potassium: 3.6 mmol/L (ref 3.5–5.1)
Sodium: 133 mmol/L — ABNORMAL LOW (ref 135–145)

## 2019-04-02 LAB — URINALYSIS, ROUTINE W REFLEX MICROSCOPIC
Bilirubin Urine: NEGATIVE
Glucose, UA: >=500 mg/dL — AB
Hgb urine dipstick: NEGATIVE
Ketones, ur: NEGATIVE mg/dL
Leukocytes,Ua: NEGATIVE
Nitrite: NEGATIVE
Protein, ur: NEGATIVE mg/dL
Specific Gravity, Urine: 1.01 (ref 1.005–1.030)
pH: 7 (ref 5.0–8.0)

## 2019-04-02 LAB — URINALYSIS, MICROSCOPIC (REFLEX): RBC / HPF: NONE SEEN RBC/hpf (ref 0–5)

## 2019-04-02 MED ORDER — ACETAMINOPHEN 325 MG PO TABS
650.0000 mg | ORAL_TABLET | Freq: Once | ORAL | Status: AC
  Administered 2019-04-02: 650 mg via ORAL
  Filled 2019-04-02: qty 2

## 2019-04-02 NOTE — ED Notes (Signed)
ED Provider at bedside. 

## 2019-04-02 NOTE — ED Provider Notes (Signed)
[facility] EMERGENCY DEPARTMENT Provider Note   CSN: [id number] Arrival date & time: 04/02/19  1647     History   Chief Complaint Chief Complaint  Patient presents with   Groin Pain    HPI Avondre Richens is a 48 y.o. male.     Pt presents to the ED today with groin bruising and swelling.  The pt had a cath through his right groin on 9/28.  He had some bruising around the site, so an Korea was done on 9/29 which showed no pseudoaneurysm, but it did show a hematoma.  The pt continues to have bruising and swelling to his groin.  He feels like it is going to pop open.      Past Medical History:  Diagnosis Date   Chronic diastolic CHF (congestive heart failure) (HCC) 03/09/2017   Echo 1/18: Mild LVH, EF 60-65, Gr 2 DD, mild LAE; difficult study-no obvious wall motion abnormalities with Definity   CKD (chronic kidney disease)    Coronary artery disease    a. s/pprior myocardial infarction, s/p multipleprior stents placedat outside institutions with last intervention 08/2015 at [facility].   Diabetes mellitus (HCC)    Drug addiction in remission [facility])    Erectile dysfunction 03/02/2017   Hyperlipidemia associated with type 2 diabetes mellitus (HCC)    Hypertension    Hypothyroidism    a. adm with profound hypothyroidism near myedema in 04/2017.   MI (myocardial infarction) (HCC) 07/2009   [facility] in [place], [place] for last 2, 1st one in [place], [place]; total of 6 stents, last one 02/2014    Morbid obesity (HCC) 07/06/2016   Noncompliance with therapeutic plan    a. thyroid med -> profound hypothyroidism 04/2017.   OSA (obstructive sleep apnea) 07/06/2016   Moderate with AHI 21.8/hr by PSG 08/2012 now on CPAP   Prolonged QT interval 05/14/2017   Tobacco abuse    Wide-complex tachycardia (HCC) 05/14/2017    Patient Active Problem List   Diagnosis Date Noted   ACS (acute coronary syndrome) (HCC) 03/25/2019   NSTEMI (non-ST elevated myocardial  infarction) (HCC) 03/25/2019   Primary osteoarthritis of both hips 02/16/2019   Dyspnea on exertion 12/04/2018   CAD S/P multiple PCI's. patient reported 9 previous stents; 9/20 DES to RPAV-1 and DES to mid RCA -1; 3/17 DES to LAD and DES to mid RCA along with Angiosculpt balloon angioplas 08/09/2017   Prolonged QT interval 05/14/2017   Wide-complex tachycardia (HCC) 05/14/2017   GERD (gastroesophageal reflux disease) 05/13/2017   Hypothyroidism    Bilateral carpal tunnel syndrome 04/08/2017   Chronic diastolic CHF (congestive heart failure) (HCC) 03/09/2017   Erectile dysfunction 03/02/2017   OSA on CPAP 07/06/2016   Morbid obesity (HCC) 07/06/2016   DM type 2 with diabetic peripheral neuropathy (HCC) 12/16/2015   CAD (coronary artery disease) 09/30/2015   Essential hypertension 09/16/2015   Hyperlipidemia     Past Surgical History:  Procedure Laterality Date   APPENDECTOMY     CARDIAC CATHETERIZATION N/A 09/16/2015   Procedure: Left Heart Cath and Coronary Angiography;  Surgeon: Runell Gess, MD;  Location: [facility] INVASIVE CV LAB;  Service: Cardiovascular;  Laterality: N/A;   CARDIAC CATHETERIZATION N/A 09/16/2015   Procedure: Coronary Stent Intervention;  Surgeon: Runell Gess, MD;  Location: [facility] INVASIVE CV LAB;  Service: Cardiovascular;  Laterality: N/A;   CORONARY STENT INTERVENTION N/A 03/27/2019   Procedure: CORONARY STENT INTERVENTION;  Surgeon: Corky Crafts, MD;  Location: [facility] INVASIVE CV  LAB;  Service: Cardiovascular;  Laterality: N/A;   CORONARY STENT PLACEMENT     LEFT HEART CATH AND CORONARY ANGIOGRAPHY N/A 03/27/2019   Procedure: LEFT HEART CATH AND CORONARY ANGIOGRAPHY;  Surgeon: Corky Crafts, MD;  Location: [facility] INVASIVE CV LAB;  Service: Cardiovascular;  Laterality: N/A;        Home Medications    Prior to Admission medications   Medication Sig Start Date End Date Taking? Authorizing Provider  aspirin 81 MG tablet Take 1  tablet (81 mg total) by mouth daily. 11/17/16   Langeland, Kathaleen Grinder, MD  empagliflozin (JARDIANCE) 10 MG TABS tablet Take 10 mg by mouth daily. 11/15/18   Marcine Matar, MD  ezetimibe (ZETIA) 10 MG tablet Take 1 tablet (10 mg total) by mouth daily. 04/08/18   Marcine Matar, MD  famotidine (PEPCID) 40 MG tablet Take 1 tablet (40 mg total) by mouth at bedtime. Patient taking differently: Take 40 mg by mouth daily before breakfast.  10/21/18   [name] T, PA-C  fenofibrate 160 MG tablet Take 1 tablet (160 mg total) by mouth daily. 03/30/19   Georgie Chard D, NP  furosemide (LASIX) 80 MG tablet TAKE 1 TABLET 2 TIMES DAILY BY MOUTH. Patient taking differently: Take 80 mg by mouth See admin instructions. Take 80 mg by mouth once a day three to four times a week 10/06/18   Pricilla Riffle, MD  glucose blood test strip Use as instructed 05/09/18   Marcine Matar, MD  Insulin Glargine (LANTUS SOLOSTAR) 100 UNIT/ML Solostar Pen Inject 72 Units into the skin 2 (two) times daily. 02/16/19   Marcine Matar, MD  insulin lispro (HUMALOG KWIKPEN) 100 UNIT/ML KwikPen Inject 0.38 mLs (38 Units total) into the skin 3 (three) times daily. Take with meals Patient taking differently: Inject 38 Units into the skin 3 (three) times daily with meals.  05/10/18   Marcine Matar, MD  Insulin Pen Needle (PEN NEEDLES) 31G X 6 MM MISC Use as directed 05/10/18   Marcine Matar, MD  Insulin Syringes, Disposable, U-100 0.5 ML MISC Use as directed 06/11/17   Marcine Matar, MD  levothyroxine (SYNTHROID) 200 MCG tablet TAKE 1 TABLET (200 MCG TOTAL) BY MOUTH DAILY. (DOSE INCREASE) Patient taking differently: Take 200 mcg by mouth daily before breakfast.  11/15/18   Marcine Matar, MD  losartan (COZAAR) 25 MG tablet Take 1 tablet (25 mg total) by mouth daily. 03/29/19   Georgie Chard D, NP  metFORMIN (GLUCOPHAGE-XR) 500 MG 24 hr tablet Take 1 tablet (500 mg total) by mouth 2 (two) times daily. 08/11/18    Marcine Matar, MD  metoprolol tartrate (LOPRESSOR) 50 MG tablet Take 1 tablet (50 mg total) by mouth 2 (two) times daily. 12/08/18   Marcine Matar, MD  mupirocin ointment (BACTROBAN) 2 % Place 1 application into the nose 2 (two) times daily. Patient not taking: Reported on 03/25/2019 12/01/18   Marcine Matar, MD  nitroGLYCERIN (NITROSTAT) 0.4 MG SL tablet Place 1 tablet (0.4 mg total) under the tongue every 5 (five) minutes x 3 doses as needed for chest pain. 03/29/19   Georgie Chard D, NP  pantoprazole (PROTONIX) 40 MG tablet Take 1 tablet (40 mg total) by mouth daily. Patient not taking: Reported on 03/25/2019 02/16/19   Marcine Matar, MD  PRESCRIPTION MEDICATION CPAP- At bedtime    [provider]  rosuvastatin (CRESTOR) 40 MG tablet Take 1 tablet (40 mg total) by  mouth daily. 11/15/18 11/15/19  Tereso NewcomerWeaver, Scott T, PA-C  sildenafil (VIAGRA) 50 MG tablet TAKE 1 TABLET (50 MG TOTAL) BY MOUTH DAILY AS NEEDED FOR ERECTILE DYSFUNCTION. Patient taking differently: Take 50 mg by mouth 2 (two) times a week.  11/15/18   [name], [name], MD  ticagrelor (BRILINTA) 90 MG TABS tablet Take 1 tablet (90 mg total) by mouth 2 (two) times daily. 03/29/19   Georgie ChardMcDaniel, Jill D, NP  triamcinolone cream (KENALOG) 0.1 % Apply 1 application topically at bedtime. To right lower leg Patient not taking: Reported on 03/25/2019 12/01/18   [name], [name], MD  TRUEplus Lancets 28G MISC Use as directed 12/08/18   [name], [name], MD    Family History Family History  Adopted: Yes  Problem Relation Age of Onset   Diabetes Maternal Grandmother     Social History Social History   Tobacco Use   Smoking status: Former Smoker    Types: Cigarettes    Quit date: 01/31/2017    Years since quitting: 2.1   Smokeless tobacco: Never Used   Tobacco comment: quit 01/2017  Substance Use Topics   Alcohol use: No    Alcohol/week: 0.0 standard drinks   Drug use: No     Allergies   Other,  Levofloxacin, and Nsaids   Review of Systems Review of Systems  Genitourinary:       Groin bruising  All other systems reviewed and are negative.    Physical Exam Updated Vital Signs BP 132/80 (BP Location: Left Arm)    Pulse 90    Temp 98.1 F (36.7 C) (Oral)    Resp 18    Ht 5\' 5"  (1.651 m)    Wt (!) 140.6 kg    SpO2 98%    BMI 51.59 kg/m   Physical Exam Vitals signs and nursing note reviewed.  Constitutional:      Appearance: Normal appearance.  HENT:     Head: Normocephalic and atraumatic.     Right Ear: External ear normal.     Left Ear: External ear normal.     Nose: Nose normal.     Mouth/Throat:     Mouth: Mucous membranes are moist.     Pharynx: Oropharynx is clear.  Eyes:     Extraocular Movements: Extraocular movements intact.     Conjunctiva/sclera: Conjunctivae normal.     Pupils: Pupils are equal, round, and reactive to light.  Neck:     Musculoskeletal: Normal range of motion and neck supple.  Cardiovascular:     Rate and Rhythm: Normal rate and regular rhythm.     Pulses: Normal pulses.     Heart sounds: Normal heart sounds.  Pulmonary:     Effort: Pulmonary effort is normal.     Breath sounds: Normal breath sounds.  Abdominal:     General: Abdomen is flat. Bowel sounds are normal.     Palpations: Abdomen is soft.  Genitourinary:    Comments: Bruising and swelling to scrotum and penis Skin:    General: Skin is warm.     Capillary Refill: Capillary refill takes less than 2 seconds.  Neurological:     General: No focal deficit present.     Mental Status: He is alert and oriented to person, place, and time.  Psychiatric:        Mood and Affect: Mood normal.        Behavior: Behavior normal.        Thought Content: Thought content normal.  Judgment: Judgment normal.      ED Treatments / Results  Labs (all labs ordered are listed, but only abnormal results are displayed) Labs Reviewed  BASIC METABOLIC PANEL - Abnormal; Notable for the  following components:      Result Value   Sodium 133 (*)    Chloride 96 (*)    Glucose, Bld 276 (*)    BUN 25 (*)    All other components within normal limits  CBC WITH DIFFERENTIAL/PLATELET - Abnormal; Notable for the following components:   Hemoglobin 12.3 (*)    RDW 15.9 (*)    All other components within normal limits  URINALYSIS, ROUTINE W REFLEX MICROSCOPIC - Abnormal; Notable for the following components:   Glucose, UA >=500 (*)    All other components within normal limits  URINALYSIS, MICROSCOPIC (REFLEX) - Abnormal; Notable for the following components:   Bacteria, UA FEW (*)    All other components within normal limits    EKG None  Radiology No results found.  Procedures Procedures (including critical care time)  Medications Ordered in ED Medications  acetaminophen (TYLENOL) tablet 650 mg (650 mg Oral Given 04/02/19 1800)     Initial Impression / Assessment and Plan / ED Course  I have reviewed the triage vital signs and the nursing notes.  Pertinent labs & imaging results that were available during my care of the patient were reviewed by me and considered in my medical decision making (see chart for details).    We do not have vascular US available here to do an [place] to r/o pseudoaneurysm.  However, with a recent US with no pseudoaneurysm and stable CBC, I have a very low suspicion.  I told pt we could transfer him to [facility] or [facility] for this study, but he declines.    The pt was given a scrotal sling at hospital d/c, but is unable to put it on or get it off.  We don't have anything here which could help, unfortunately.  Pt was told to use ice to help with the swelling.  Pt is able to urinate.   Final Clinical Impressions(s) / ED Diagnoses   Final diagnoses:  Postoperative hematoma involving circulatory system following cardiac catheterization  Groin swelling    ED Discharge Orders    None       Isla Pence, MD 04/02/19 1824

## 2019-04-02 NOTE — ED Notes (Signed)
Pt prefers printed script if issued tonight

## 2019-04-02 NOTE — ED Triage Notes (Signed)
Pain and swelling to testicles and penis. He had cardiac stents placed this week

## 2019-04-03 NOTE — Research (Signed)
Inclusion/Exclusion Checklist:   Inclusions:   Y N   _0  _1  Male or male at least 50 years of age  _2  _3  Evidence of type I (spontaneous) MI (STEMI or NSTEMI) caused by atherothrombotic artery disease as defined by the following:  _4  _5  a. Detection of a rise and/or fall in Troponin I or T with at least 1 value about the 99% upper reference limit.     (AND)---  Any 1 or more of the following:   _6  _7  - symptoms of ischemia (ie, resulting from a primary coronary    artery event)  _8  _9  - New or presumably new significant ST/T wave changes or left bundle branch block.  _10  _11       - Development of pathological Q waves on EKG  _12  _13  - Imaging evidence of new loss or viable myocardium or regional wall motion abnormality.  _14  _15  - ID of intracoronary thrombus by angiography.  _16  _17  No suspicion of acute kidney injury at least 12 hours after angiography OR after first medical contract for subject's not undergoing angiography There must be documented evidence of stable renal function defined as no more than an increase in Serum Creatinine < 0.40m/dl from pre-contrast serum creatinine value.  (Before _0.89___   12 hrs after __0.81___)    Evidence of multi-vessel coronary artery disease defined as:  _18  _19  A. At least 50% stenosis of theleft main coronary artery or at least 2 epicardial coronary artery territories (LAD, LCx, RCA) on catherization performed during the index hospitalization.   _20  _21  B. Prior cardiac catherization documenting @ least 50% stenosis of the LM or at least 2 epicardial >1 epicardial artery territories (LAD, LCx, RCA)  _22  _23  C. Prior PCI and evidence of 50% stenosis of at least 1 epicardial coronary artery territory different from prior revascularized artery territory.   _24  _25  D. Prior multivessel coronary artery bypass grafting.  _26  _27  Plus either Established risk factors:  _28  _29  o On pharmacological treatment for diabetes mellitus                 OR    TWO of the  following  _30  _31  o Prior history of MI  _32  _33  o Age ? 65 years  _34  _35  o Peripheral arterial disease defined as meeting at least 1 of the following criteria:  _36  _37          +   Current intermittent claudication or resting limb ischemia and ABI    ?0.90  _38  _39          +   History of peripheral revascularization (surgical or percutaneous)  _40  _41          +  History of limb amputation due to PAD  _42  _43          +  Angiographic evidence (using computed tomographic angiography, MRA, or invasive angiography or a peripheral artery stenosis ?50%.  _44  _45  If the male subject without child bearing potential, not breastfeeding, not pregnant, and if of child bearing potential agree to contraception or lifestyle methods to avoid pregnancy? Child-bearing potential (must select all)   ____ not pregnant (by urine or serum hCG AND   ____ willing to use an acceptable method of contraception to avoid pregnancy during the study and for 3 months after last dose of investional product (refer to acceptable methods per protocol)   ____ if breastfeeding, willing to cease breastfeeding Date of pregnancy test: (____ /_____/ ________)  Result  -  or + Not of Child bearing potential (select one)   ____ Age >= 22   ____ Age 1-60 with amenorrhea for at least 1 year with documented evidence of follicle-stimulating hormone level >40 IU/L   ____ Surgically sterile for at least 3 months prior to randomization  _0  _1  Investigator believes that the subject is willing and able to adhere to all protocol requirements.   _2  _3  Willing to not participate in another investigational study until completion of their final study visit.     Exclusions:  Y N   _4  _5  If these are the reason for MI (pt is excluded)  _6  _7  1. Myocardial necrosis due mismatch between myocardial oxygen demand and supply, usually due to fixed coronary disease with increased demand leading to MI  _8  _9  2. Cardiac death due to MI  _10  _11  3. Myocardial  necrosis due to complications from a PCI  _12  _13  4. Myocardial necrosis due to stent thrombosis  _14  _15  5. Myocardial necrosis due to in stent restenosis as the only etiology  _16  _17  6. Myocardial necrosis in the stenting of coronary artery bypass grafting  _18  _19  Ongoing hemodynamic instability  _20  _21       +  History of NYHA Class III or IV heart failure within the last year  _22  _23       +  [place] Class III or IV heart failure  _24  _25       +  Sustained and/or symptomatic hypotension (SBP <90 mm HG)  _26  _27       +  Known left ventricular ejection fraction of <30%  _28  _29  Evidence of hepatobiliary disease as indicated by any 1 or more of the   following at screening:  _30  _31       +  Current active hepatic dysfunction or active biliary obstruction  _32  _33       +       +  Chronic or prior history of cirrhosis or of infectious / inflammatory hepatitis NOTE: If a patient has a medical history of recovered Hep A, B, or C without evidence of cirrhosis, he/she could be considered for inclusion if there is documented evidence that there is no active infection (ie, antigen negative)  _34  _35       + Hepatic lab abnormalities: ALT > 3 x ULN or Total bilirubin > 2x ULN at randomization.  _36  _37  Severe chronic kidney disease (eGFR of <39m) or on dialysis  _38  _39  Plan to undergo scheduled coronary artery bypass graft surgery after randomization, as determined at the time of screening  _40  _41  Known history of allergies to soybeans, peanuts, albumin  _42  _43  Body weight <50 kg  _44  _45  A known history of IgA deficiency or antibodies to IgA  _46  _47  A comorbid condition with an estimated life expectancy of ? 6 months at time of consent  _48  _49  Women who are pregnant or breastfeeding at time of randomization  _50  _51  Participated in another interventional clinical study at the time of consent  _52  _53  Treatment with anticancer therapy  _54  _55  Previously randomized or participated in this study or previously exposed  to CSL112   [id number]  MOBILITY:    I HAVE NO PROBLEMS WALKING _56   I HAVE SLIGHT PROBLEMS WALKING _57   I HAVE MODERATE PROBLEMS WALKING _58   I HAVE SEVERE PROBLEMS WALKING _59   I AM UNABLE TO WALK  _60     SELF-CARE:   I HAVE NO PROBLEMS WASING OR DRESSING MYSELF  [  x]  I HAVE SLIGHT PROBLEMS WASHING OR DRESSING MYSELF  _0   I HAVE MODERATE PROBLEMS WASHING OR DRESSING MYSELF _1   I HAVE SEVERE PROBLEMS WASHING OR DRESSING MYSELF  _2   I HAVE SEVERE PROBLEMS WASHING OR DRESSING MYSELF  _3   I AM UNABLE TO [place] OR DRESS MYSELF _4     USUAL ACTIVITIES: (E.G. WORK/STUDY/HOUSEWORK/FAMILY OR LEISURE ACTIVITIES.    I HAVE NO PROBLEMS DOING MY USUAL ACTIVITIES _5   I HAVE SLIGHT PROBLEMS DOING MY USUAL ACTIVITIES _6   I HAVE MODERATE PROBLEMS DOING MY USUAL ACTIVIITIES _7   I HAVE SEVERE PROBLEMS DOING MY USUAL ACTIVITIES _8   I AM UNABLE TO DO MY USUAL ACTIVITIES _9     PAIN /DISCOMFORT   I HAVE NO PAIN OR DISCOMFORT _10   I HAVE SLIGHT PAIN OR DISCOMFORT _11   I HAVE MODERATE PAIN OR DISCOMFORT _12   I HAVE SEVERE PAIN OR DISCOMFORT _13   I HAVE EXTREME PAIN OR DISCOMFORT _14     ANXIETY/DEPRESSION   I AM NOT ANXIOUS OR DEPRESSED _15   I AM SLIGHTLY ANXIOUS OR DEPRESSED _16   I AM MODERATELY ANXIOUS OR DREPRESSED _17   I AM SEVERELY ANXIOUS OR DEPRESSED _18   I AM EXTREMELY ANXIOUS OR DEPRESSED _19     SCALE OF 0-100 HOW WOULD YOU RATE TODAY?  0 IS THE WORSE AND 100 IS THE BEST HEALTH YOU CAN IMAGINE: 25   Labs reviewed by Dr Lia Foyer.

## 2019-04-04 ENCOUNTER — Ambulatory Visit (HOSPITAL_COMMUNITY)
Admit: 2019-04-04 | Discharge: 2019-04-04 | Disposition: A | Discharge status: Home / Self Care | Payer: Self-pay | Attending: Internal Medicine | Admitting: Internal Medicine

## 2019-04-04 ENCOUNTER — Encounter: Payer: Self-pay | Admitting: *Deleted

## 2019-04-04 ENCOUNTER — Other Ambulatory Visit: Payer: Self-pay

## 2019-04-04 DIAGNOSIS — Z006 Encounter for examination for normal comparison and control in clinical research program: Secondary | ICD-10-CM

## 2019-04-04 MED ORDER — STUDY - AEGIS II STUDY - PLACEBO OR CSL112 (PI-HILTY)
170.0000 mL | Freq: Once | INTRAVENOUS | Status: AC
  Administered 2019-04-04: 170 mL via INTRAVENOUS
  Filled 2019-04-04: qty 170

## 2019-04-05 NOTE — Research (Signed)
V3 infusion 2  Pt doing well, no complaints of sob or cp. He came [facility] on the 10/04 for groin pain, diagnose as hematoma, which started the 09/29 while in hospital, had a [place] completed while in hospital no pseudoaneurysm was found. Pt states his groin is still swollen but dealing with it. Will document it as an SAE due to it prolonged his hospitalization. No med changes.                                    "CONSENT"   YES     NO   Continuing further Investigational Product and study visits for follow-up? [x]  []   Continuing consent from future biomedical research [x]  []                                    "EVENTS"    YES     NO  AE   (IF YES SEE SOURCE) []  [x]   SAE  (IF YES SEE SOURCE) [x]  []   ENDPOINT   (IF YES SEE SOURCE) []  [x]   REVASCULARIZATION  (IF YES SEE SOURCE) []  [x]   AMPUTATION   (IF YES SEE SOURCE) []  [x]   TROPONIN'S  (IF YES SEE SOURCE) []  [x]     Current Outpatient Medications:  .  aspirin 81 MG tablet, Take 1 tablet (81 mg total) by mouth daily., Disp: 90 tablet, Rfl: 3 .  empagliflozin (JARDIANCE) 10 MG TABS tablet, Take 10 mg by mouth daily., Disp: 90 tablet, Rfl: 3 .  ezetimibe (ZETIA) 10 MG tablet, Take 1 tablet (10 mg total) by mouth daily., Disp: 90 tablet, Rfl: 3 .  famotidine (PEPCID) 40 MG tablet, Take 1 tablet (40 mg total) by mouth at bedtime. (Patient taking differently: Take 40 mg by mouth daily before breakfast. ), Disp: 90 tablet, Rfl: 0 .  fenofibrate 160 MG tablet, Take 1 tablet (160 mg total) by mouth daily., Disp: 60 tablet, Rfl: 2 .  furosemide (LASIX) 80 MG tablet, TAKE 1 TABLET 2 TIMES DAILY BY MOUTH. (Patient taking differently: Take 80 mg by mouth See admin instructions. Take 80 mg by mouth once a day three to four times a week), Disp: 180 tablet, Rfl: 0 .  glucose blood test strip, Use as instructed, Disp: 100 each, Rfl: 12 .  Insulin Glargine (LANTUS SOLOSTAR) 100 UNIT/ML Solostar Pen, Inject 72 Units into the skin 2 (two) times daily., Disp: 5 pen, Rfl:  11 .  insulin lispro (HUMALOG KWIKPEN) 100 UNIT/ML KwikPen, Inject 0.38 mLs (38 Units total) into the skin 3 (three) times daily. Take with meals (Patient taking differently: Inject 38 Units into the skin 3 (three) times daily with meals. ), Disp: 15 mL, Rfl: 11 .  Insulin Pen Needle (PEN NEEDLES) 31G X 6 MM MISC, Use as directed, Disp: 100 each, Rfl: 6 .  Insulin Syringes, Disposable, U-100 0.5 ML MISC, Use as directed, Disp: 100 each, Rfl: 11 .  levothyroxine (SYNTHROID) 200 MCG tablet, TAKE 1 TABLET (200 MCG TOTAL) BY MOUTH DAILY. (DOSE INCREASE) (Patient taking differently: Take 200 mcg by mouth daily before breakfast. ), Disp: 30 tablet, Rfl: 6 .  losartan (COZAAR) 25 MG tablet, Take 1 tablet (25 mg total) by mouth daily., Disp: 60 tablet, Rfl: 2 .  metFORMIN (GLUCOPHAGE-XR) 500 MG 24 hr tablet, Take 1 tablet (500 mg total) by mouth 2 (two)  times daily., Disp: 60 tablet, Rfl: 11 .  metoprolol tartrate (LOPRESSOR) 50 MG tablet, Take 1 tablet (50 mg total) by mouth 2 (two) times daily., Disp: 180 tablet, Rfl: 3 .  mupirocin ointment (BACTROBAN) 2 %, Place 1 application into the nose 2 (two) times daily. (Patient not taking: Reported on 03/25/2019), Disp: 22 g, Rfl: 0 .  nitroGLYCERIN (NITROSTAT) 0.4 MG SL tablet, Place 1 tablet (0.4 mg total) under the tongue every 5 (five) minutes x 3 doses as needed for chest pain., Disp: 25 tablet, Rfl: 0 .  pantoprazole (PROTONIX) 40 MG tablet, Take 1 tablet (40 mg total) by mouth daily. (Patient not taking: Reported on 03/25/2019), Disp: 90 tablet, Rfl: 3 .  PRESCRIPTION MEDICATION, CPAP- At bedtime, Disp: , Rfl:  .  rosuvastatin (CRESTOR) 40 MG tablet, Take 1 tablet (40 mg total) by mouth daily., Disp: 30 tablet, Rfl: 11 .  sildenafil (VIAGRA) 50 MG tablet, TAKE 1 TABLET (50 MG TOTAL) BY MOUTH DAILY AS NEEDED FOR ERECTILE DYSFUNCTION. (Patient taking differently: Take 50 mg by mouth 2 (two) times a week. ), Disp: 10 tablet, Rfl: 6 .  ticagrelor (BRILINTA) 90 MG  TABS tablet, Take 1 tablet (90 mg total) by mouth 2 (two) times daily., Disp: 120 tablet, Rfl: 3 .  triamcinolone cream (KENALOG) 0.1 %, Apply 1 application topically at bedtime. To right lower leg (Patient not taking: Reported on 03/25/2019), Disp: 30 g, Rfl: 0 .  TRUEplus Lancets 28G MISC, Use as directed, Disp: 100 each, Rfl: 12

## 2019-04-06 ENCOUNTER — Inpatient Hospital Stay: Payer: Self-pay

## 2019-04-06 NOTE — Research (Signed)
[facility]

## 2019-04-11 ENCOUNTER — Ambulatory Visit (HOSPITAL_COMMUNITY)
Admission: RE | Admit: 2019-04-11 | Discharge: 2019-04-11 | Disposition: A | Discharge status: Home / Self Care | Payer: Self-pay | Source: Ambulatory Visit | Attending: Internal Medicine | Admitting: Internal Medicine

## 2019-04-11 ENCOUNTER — Ambulatory Visit: Payer: Self-pay

## 2019-04-11 ENCOUNTER — Encounter: Payer: Self-pay | Admitting: *Deleted

## 2019-04-11 ENCOUNTER — Other Ambulatory Visit: Payer: Self-pay

## 2019-04-11 VITALS — BP 135/90 | HR 72

## 2019-04-11 DIAGNOSIS — Z006 Encounter for examination for normal comparison and control in clinical research program: Secondary | ICD-10-CM

## 2019-04-11 MED ORDER — STUDY - AEGIS II STUDY - PLACEBO OR CSL112 (PI-HILTY)
170.0000 mL | Freq: Once | INTRAVENOUS | Status: AC
  Administered 2019-04-11: 170 mL via INTRAVENOUS
  Filled 2019-04-11: qty 170

## 2019-04-11 NOTE — Research (Signed)
Late entry:   DEMOGRAPHICS:  Patient Name: Marc Walker  Birth Date: 1970-11-14  Sex: Male  Race: white  Child Bearing: ? Yes    ? No ? Tubial ligation ? Hysterectomy  ? postmenopausal   Height: 166 cm Weight: 139 kg   Index Procedure:  Onset date of symptoms: 24-Sep-20 Onset of symptoms: 11:00  Date of First contact at hospital: 26-Sep-20 Time of first contact at hospital: 11:01  Admission Date: 26-Sep-20   Discharge Date: 30-Sep-20 Discharge Time: 1413   Vital Signs: Date 03/25/2019    Time: 11:06 BP: 148/86  Pulse: 86    Concomitant medications: Every visit: ? See med sheet  BMP Pre Contrast IV 03/27/19 @ 0722  0.89  CMP Post Contrast IV 12 hours later: 03/28/2019 @ 1245  0.81 Hepatic Panel: 03/28/19 @ 1245 ALT: 44 Total Bili: 0.9 Direct Bili: 0.1   Medical History:  ? CAD ? Prior MI ? PAD  ? History of Heart Failure ? Moderate to severe valvular dx ? AFib  ? Prior Coronary Revascularization  if YES please select Yes or No below:  CABG ? Yes   ? No           PCI with stent ? Yes   ? No          PCI without stent ? Yes ? No  ? CVA if checked please select one of the following Choose an item.  ? Hypertension  ? Gilberts syndrome ? CKD   ? Hypocholesteremia ? DM ? Smoker Former                       ? eCigarette  [place] Class Stage 1     EQ-5D-3L ?  36  Future Biomedical Research: Consented ? Yes     ? No If no please date they withdrew consent from biomedical research Click or tap to enter a date.  [facility] Before Start of Infusion: ? Biochemistry panel      ? Hematology     ? Immunogenicity   (30 mins before infusion)   ? Parvovirus  ? FBR sample ? PK/PD sample [facility] End of Infusion:   ? PK/PD Central Blood Draw Time: Before SOI:  _09/29/20 @ 1615_ After EOI: _09/29/20 @ 1818__ Infusion Start Time: 03/28/2019 4:18 PM  Infusion End Time: 03/28/2019 6:15 PM

## 2019-04-11 NOTE — Research (Signed)
AEGIS V4  Patient doing well, no complaints of cp or sob. No med changes per patient.  Hematoma is ongoing but improving per patient.                                     "CONSENT"   YES     NO   Continuing further Investigational Product and study visits for follow-up? [x]  []   Continuing consent from future biomedical research [x]  []                                    "EVENTS"    YES     NO  AE   (IF YES SEE SOURCE) []  [x]   SAE  (IF YES SEE SOURCE) []  [x]   ENDPOINT   (IF YES SEE SOURCE) []  [x]   REVASCULARIZATION  (IF YES SEE SOURCE) []  [x]   AMPUTATION   (IF YES SEE SOURCE) []  [x]   TROPONIN'S  (IF YES SEE SOURCE) []  [x]     Current Outpatient Medications:  .  aspirin 81 MG tablet, Take 1 tablet (81 mg total) by mouth daily., Disp: 90 tablet, Rfl: 3 .  empagliflozin (JARDIANCE) 10 MG TABS tablet, Take 10 mg by mouth daily., Disp: 90 tablet, Rfl: 3 .  ezetimibe (ZETIA) 10 MG tablet, Take 1 tablet (10 mg total) by mouth daily., Disp: 90 tablet, Rfl: 3 .  famotidine (PEPCID) 40 MG tablet, Take 1 tablet (40 mg total) by mouth at bedtime. (Patient taking differently: Take 40 mg by mouth daily before breakfast. ), Disp: 90 tablet, Rfl: 0 .  fenofibrate 160 MG tablet, Take 1 tablet (160 mg total) by mouth daily., Disp: 60 tablet, Rfl: 2 .  furosemide (LASIX) 80 MG tablet, TAKE 1 TABLET 2 TIMES DAILY BY MOUTH. (Patient taking differently: Take 80 mg by mouth See admin instructions. Take 80 mg by mouth once a day three to four times a week), Disp: 180 tablet, Rfl: 0 .  glucose blood test strip, Use as instructed, Disp: 100 each, Rfl: 12 .  Insulin Glargine (LANTUS SOLOSTAR) 100 UNIT/ML Solostar Pen, Inject 72 Units into the skin 2 (two) times daily., Disp: 5 pen, Rfl: 11 .  insulin lispro (HUMALOG KWIKPEN) 100 UNIT/ML KwikPen, Inject 0.38 mLs (38 Units total) into the skin 3 (three) times daily. Take with meals (Patient taking differently: Inject 38 Units into the skin 3 (three) times daily with  meals. ), Disp: 15 mL, Rfl: 11 .  Insulin Pen Needle (PEN NEEDLES) 31G X 6 MM MISC, Use as directed, Disp: 100 each, Rfl: 6 .  Insulin Syringes, Disposable, U-100 0.5 ML MISC, Use as directed, Disp: 100 each, Rfl: 11 .  levothyroxine (SYNTHROID) 200 MCG tablet, TAKE 1 TABLET (200 MCG TOTAL) BY MOUTH DAILY. (DOSE INCREASE) (Patient taking differently: Take 200 mcg by mouth daily before breakfast. ), Disp: 30 tablet, Rfl: 6 .  losartan (COZAAR) 25 MG tablet, Take 1 tablet (25 mg total) by mouth daily., Disp: 60 tablet, Rfl: 2 .  metFORMIN (GLUCOPHAGE-XR) 500 MG 24 hr tablet, Take 1 tablet (500 mg total) by mouth 2 (two) times daily., Disp: 60 tablet, Rfl: 11 .  metoprolol tartrate (LOPRESSOR) 50 MG tablet, Take 1 tablet (50 mg total) by mouth 2 (two) times daily., Disp: 180 tablet, Rfl: 3 .  nitroGLYCERIN (NITROSTAT) 0.4 MG SL tablet, Place 1 tablet (  0.4 mg total) under the tongue every 5 (five) minutes x 3 doses as needed for chest pain., Disp: 25 tablet, Rfl: 0 .  pantoprazole (PROTONIX) 40 MG tablet, Take 1 tablet (40 mg total) by mouth daily., Disp: 90 tablet, Rfl: 3 .  PRESCRIPTION MEDICATION, CPAP- At bedtime, Disp: , Rfl:  .  rosuvastatin (CRESTOR) 40 MG tablet, Take 1 tablet (40 mg total) by mouth daily., Disp: 30 tablet, Rfl: 11 .  sildenafil (VIAGRA) 50 MG tablet, TAKE 1 TABLET (50 MG TOTAL) BY MOUTH DAILY AS NEEDED FOR ERECTILE DYSFUNCTION. (Patient taking differently: Take 50 mg by mouth 2 (two) times a week. ), Disp: 10 tablet, Rfl: 6 .  ticagrelor (BRILINTA) 90 MG TABS tablet, Take 1 tablet (90 mg total) by mouth 2 (two) times daily., Disp: 120 tablet, Rfl: 3 .  TRUEplus Lancets 28G MISC, Use as directed, Disp: 100 each, Rfl: 12 .  mupirocin ointment (BACTROBAN) 2 %, Place 1 application into the nose 2 (two) times daily. (Patient not taking: Reported on 04/11/2019), Disp: 22 g, Rfl: 0 .  triamcinolone cream (KENALOG) 0.1 %, Apply 1 application topically at bedtime. To right lower leg  (Patient not taking: Reported on 04/11/2019), Disp: 30 g, Rfl: 0 No current facility-administered medications for this visit.   Facility-Administered Medications Ordered in Other Visits:  .  STUDY - AEGIS II - placebo or CSL112 (PI-Hilty), 170 mL, Intravenous, Once, Hilty, Nadean Corwin, MD, Last Rate: 85 mL/hr at 04/11/19 1230, 170 mL at 04/11/19 1230

## 2019-04-14 ENCOUNTER — Encounter: Payer: Self-pay | Admitting: Cardiology

## 2019-04-14 ENCOUNTER — Other Ambulatory Visit: Payer: Self-pay

## 2019-04-14 ENCOUNTER — Ambulatory Visit (INDEPENDENT_AMBULATORY_CARE_PROVIDER_SITE_OTHER): Payer: Self-pay | Admitting: Cardiology

## 2019-04-14 VITALS — BP 130/80 | HR 95 | Ht 65.0 in | Wt 304.0 lb

## 2019-04-14 DIAGNOSIS — I10 Essential (primary) hypertension: Secondary | ICD-10-CM

## 2019-04-14 DIAGNOSIS — I2510 Atherosclerotic heart disease of native coronary artery without angina pectoris: Secondary | ICD-10-CM

## 2019-04-14 DIAGNOSIS — Z79899 Other long term (current) drug therapy: Secondary | ICD-10-CM

## 2019-04-14 DIAGNOSIS — E1142 Type 2 diabetes mellitus with diabetic polyneuropathy: Secondary | ICD-10-CM

## 2019-04-14 DIAGNOSIS — I5032 Chronic diastolic (congestive) heart failure: Secondary | ICD-10-CM

## 2019-04-14 DIAGNOSIS — E785 Hyperlipidemia, unspecified: Secondary | ICD-10-CM

## 2019-04-14 MED ORDER — CLOPIDOGREL BISULFATE 75 MG PO TABS: 75.0000 mg | ORAL_TABLET | Freq: Every day | ORAL | 3 refills | Status: DC

## 2019-04-14 NOTE — Patient Instructions (Addendum)
Medication Instructions:  Your physician recommends that you continue on your current medications as directed. Please refer to the Current Medication list given to you today.  *If you need a refill on your cardiac medications before your next appointment, please call your pharmacy*  Lab Work: TODAY: BMET   FUTURE:Your physician recommends that you return for a FASTING lipid profile on 05/15/2019 (our lab is open from 7:30 AM to 4:30 PM)   If you have labs (blood work) drawn today and your tests are completely normal, you will receive your results only by: Marland Kitchen. MyChart Message (if you have MyChart) OR . A paper copy in the mail If you have any lab test that is abnormal or we need to change your treatment, we will call you to review the results.  Testing/Procedures: None   Follow-Up: At [facility], you and your health needs are our priority.  As part of our continuing mission to provide you with exceptional heart care, we have created designated Provider Care Teams.  These Care Teams include your primary Cardiologist (physician) and Advanced Practice Providers (APPs -  Physician Assistants and Nurse Practitioners) who all work together to provide you with the care you need, when you need it.  Your next appointment:  You are scheduled to see Dr. Tenny Crawoss on 07/17/2019 @ 10:00 AM  Other Instructions  Lifestyle Modifications to Prevent and Treat Heart Disease -Recommend heart healthy/Mediterranean diet, with whole grains, fruits, vegetables, fish, lean meats, nuts, olive oil and avocado oil.  -Limit salt intake to less than 2000 mg per day.  -Recommend moderate walking, starting slowly with a few minutes and working up to 3-5 times/week for 30-50 minutes each session. Aim for at least 150 minutes.week. Goal should be pace of 3 miles/hours, or walking 1.5 miles in 30 minutes -Recommend avoidance of tobacco products. Avoid excess alcohol. -Keep blood pressure well controlled, ideally less than  130/80.     Mediterranean Diet A Mediterranean diet refers to food and lifestyle choices that are based on the traditions of countries located on the [place]. This way of eating has been shown to help prevent certain conditions and improve outcomes for people who have chronic diseases, like kidney disease and heart disease. What are tips for following this plan? Lifestyle  Cook and eat meals together with your family, when possible.  Drink enough fluid to keep your urine clear or pale yellow.  Be physically active every day. This includes: ? Aerobic exercise like running or swimming. ? Leisure activities like gardening, walking, or housework.  Get 7-8 hours of sleep each night.  If recommended by your health care provider, drink red wine in moderation. This means 1 glass a day for nonpregnant women and 2 glasses a day for men. A glass of wine equals 5 oz (150 mL). Reading food labels   Check the serving size of packaged foods. For foods such as rice and pasta, the serving size refers to the amount of cooked product, not dry.  Check the total fat in packaged foods. Avoid foods that have saturated fat or trans fats.  Check the ingredients list for added sugars, such as corn syrup. Shopping  At the grocery store, buy most of your food from the areas near the walls of the store. This includes: ? Fresh fruits and vegetables (produce). ? Grains, beans, nuts, and seeds. Some of these may be available in unpackaged forms or large amounts (in bulk). ? Fresh seafood. ? Poultry and eggs. ? Low-fat  dairy products.  Buy whole ingredients instead of prepackaged foods.  Buy fresh fruits and vegetables in-season from local farmers markets.  Buy frozen fruits and vegetables in resealable bags.  If you do not have access to quality fresh seafood, buy precooked frozen shrimp or canned fish, such as tuna, salmon, or sardines.  Buy small amounts of raw or cooked vegetables, salads,  or olives from the deli or salad bar at your store.  Stock your pantry so you always have certain foods on hand, such as olive oil, canned tuna, canned tomatoes, rice, pasta, and beans. Cooking  Cook foods with extra-virgin olive oil instead of using butter or other vegetable oils.  Have meat as a side dish, and have vegetables or grains as your main dish. This means having meat in small portions or adding small amounts of meat to foods like pasta or stew.  Use beans or vegetables instead of meat in common dishes like chili or lasagna.  Experiment with different cooking methods. Try roasting or broiling vegetables instead of steaming or sauteing them.  Add frozen vegetables to soups, stews, pasta, or rice.  Add nuts or seeds for added healthy fat at each meal. You can add these to yogurt, salads, or vegetable dishes.  Marinate fish or vegetables using olive oil, lemon juice, garlic, and fresh herbs. Meal planning   Plan to eat 1 vegetarian meal one day each week. Try to work up to 2 vegetarian meals, if possible.  Eat seafood 2 or more times a week.  Have healthy snacks readily available, such as: ? Vegetable sticks with hummus. ? [place] yogurt. ? Fruit and nut trail mix.  Eat balanced meals throughout the week. This includes: ? Fruit: 2-3 servings a day ? Vegetables: 4-5 servings a day ? Low-fat dairy: 2 servings a day ? Fish, poultry, or lean meat: 1 serving a day ? Beans and legumes: 2 or more servings a week ? Nuts and seeds: 1-2 servings a day ? Whole grains: 6-8 servings a day ? Extra-virgin olive oil: 3-4 servings a day  Limit red meat and sweets to only a few servings a month What are my food choices?  Mediterranean diet ? Recommended  Grains: Whole-grain pasta. Brown rice. Bulgar wheat. Polenta. Couscous. Whole-wheat bread. [place].  Vegetables: Artichokes. Beets. Broccoli. Cabbage. Carrots. Eggplant. Green beans. Chard. Kale. Spinach. Onions. Leeks.  Peas. Squash. Tomatoes. Peppers. Radishes.  Fruits: Apples. Apricots. Avocado. Berries. Bananas. Cherries. Dates. Figs. Grapes. Lemons. Melon. Oranges. Peaches. Plums. Pomegranate.  Meats and other protein foods: Beans. Almonds. Sunflower seeds. Pine nuts. Peanuts. [place]. Salmon. Scallops. Shrimp. [place]. Tilapia. Clams. Oysters. Eggs.  Dairy: Low-fat milk. Cheese. Greek yogurt.  Beverages: Water. Red wine. Herbal tea.  Fats and oils: Extra virgin olive oil. Avocado oil. Grape seed oil.  Sweets and desserts: [place] yogurt with honey. Baked apples. Poached pears. Trail mix.  Seasoning and other foods: Basil. Cilantro. Coriander. Cumin. Mint. Parsley. Sage. Rosemary. Tarragon. Garlic. Oregano. Thyme. Pepper. Balsalmic vinegar. Tahini. Hummus. Tomato sauce. Olives. Mushrooms. ? Limit these  Grains: Prepackaged pasta or rice dishes. Prepackaged cereal with added sugar.  Vegetables: Deep fried potatoes (french fries).  Fruits: Fruit canned in syrup.  Meats and other protein foods: Beef. Pork. Lamb. Poultry with skin. Hot dogs. [place] Salines.  Dairy: Ice cream. Sour cream. Whole milk.  Beverages: Juice. Sugar-sweetened soft drinks. Beer. Liquor and spirits.  Fats and oils: Butter. Canola oil. Vegetable oil. Beef fat (tallow). Lard.  Sweets and desserts: Cookies. Cakes. Pies.  Candy.  Seasoning and other foods: Mayonnaise. Premade sauces and marinades. The items listed may not be a complete list. Talk with your dietitian about what dietary choices are right for you. Summary  The Mediterranean diet includes both food and lifestyle choices.  Eat a variety of fresh fruits and vegetables, beans, nuts, seeds, and whole grains.  Limit the amount of red meat and sweets that you eat.  Talk with your health care provider about whether it is safe for you to drink red wine in moderation. This means 1 glass a day for nonpregnant women and 2 glasses a day for men. A glass of wine equals 5 oz (150 mL). This  information is not intended to replace advice given to you by your health care provider. Make sure you discuss any questions you have with your health care provider. Document Released: 02/06/2016 Document Revised: 02/13/2016 Document Reviewed: 02/06/2016 Elsevier Patient Education  2020 [employer].

## 2019-04-14 NOTE — Progress Notes (Signed)
Cardiology Office Note:    Date:  04/14/2019   ID:  Marc Walker, DOB 1971/02/20, MRN [id number]  PCP:  Marcine Matar, MD  Cardiologist:  Dietrich Pates, MD  Referring MD: Marcine Matar, MD   Chief Complaint  Patient presents with   Hospitalization Follow-up    S/P PCI/stent    History of Present Illness:    Marc Walker is a 48 y.o. male with a past medical history significant for CAD s/pprior myocardial infarction, s/p multipleprior stents placedat outside institutions, DM, HTN, HLDwith hypertriglyceridemia, tobacco abuse, OSA, diastolic HF, obesity and former smoker, quit 2 years ago. He was admitted to [facility] in 3/17 with unstable angina. LHC demonstrated 80% mid RCA stenosis treated with DES, 90% proximal LAD and 70% mid LAD stenosis involving the previous stent treated with DES, 20% RPDA in-stent restenosis treated with angioplasty. The stent in OM2 was patent. Residual disease included 90% D2 stenosis and 70% mid LCx stenosis, treated medically.  She presented to [facility] on 03/25/2019 with complaints of anterior chest pain and bilateral arm and jaw pain.  Peak troponin was 4315.  He was treated for NSTEMI and taken to the Cath Lab on 03/27/2019.  Cath showed patent prior stents with new mid RCA 95% stenosis.  This was treated with PCI/DES. There was also a RPAV-1 that was noted to be 80% stenosed with PCI/DES placement. LVEF noted to be 55-65% by visual estimate with follow up echocardiogram completed. Echo showed EF to be 60-65% with LVH and no valvular disease.  Plavix was switched to Brilinta and losartan.  Fenofibrate was increased to 160 mg.  The patient was previously on Lasix 80 mg twice daily but was only taking it 3-4 times per week.  He was discharged home on Lasix 40 mg twice daily.  During his hospitalization the patient developed significant groin and testicular swelling.  Vascular duplex showed no evidence of pseudoaneurysm or AV  fistula.  Hematoma was noted.  There was no drop in hemoglobin.  He was instructed to ice his testicles and groin.  He was seen in the ED in [facility] on 04/02/2019 for evaluation of groin bruising and swelling.  They did not have ultrasound to evaluate for pseudoaneurysm however with his recent ultrasound in the hospital showing no pseudoaneurysm and stable CBC, there is low suspicion.  He was offered to be transferred to [facility] or [facility] for a study but he declined.  He was able to urinate.  Unable to use the scrotal sling.  He was advised to use ice.  Pt participates in the AEGIS-II Trial at [facility].   Marc Walker is here today for hospital follow up. He is short of breath with exertion, walking in.  He says that its worse since he has to wear a mask. He got a jock strap at the sporting goods store and this has helped his groin and testicles, still swollen but improved. He can now put his knees together. He also has a sacral cushion that he uses backwards.   He denies chest pain, "not even with exertion". His shoulder pain from prior to his stent is no longer present. No further jaw/neck pain.   His hands and feet feel tight. He says that he urinates constatnly even when he does not take his lasix. He has only been taking his lasix once a day and not at all when he has to go out.    Cardiac studies  Cardiac Cath 03/27/19  Non-stenotic Ost LAD to Prox LAD lesion was previously treated.  Previously placed Prox LAD to Mid LAD stent (unknown type) is widely patent.  2nd Diag lesion is 90% stenosed.  Mid Cx lesion is 70% stenosed. This is a small vessel after the origin of the large OM.  Non-stenotic Ost 2nd Mrg to 2nd Mrg lesion was previously treated.  Ost RPDA to RPDA lesion is 20% stenosed.  Mid LAD to Dist LAD lesion is 10% stenosed.  Previously placed 1st Mrg-1 stent (unknown type) is widely patent.  1st Mrg-2 lesion is 50% stenosed.  Mid RCA-1 lesion is 95%  stenosed.  A drug-eluting stent was successfully placed using a STENT SYNERGY DES 3.5X16.  Post intervention, there is a 0% residual stenosis.  Previously placed Mid RCA-2 stent (unknown type) is widely patent.  RPAV-1 lesion is 80% stenosed.  A drug-eluting stent was successfully placed using a STENT SYNERGY DES 2.25X12.  Post intervention, there is a 0% residual stenosis.  RPAV-2 lesion is 50% stenosed. THese is diffuse small vessel disease in the distal vasculature.  Prox RCA lesion is 20% stenosed.  3rd RPL lesion is 10% stenosed.  Mid RCA to Dist RCA lesion is 20% stenosed.  The left ventricular systolic function is normal.  LV end diastolic pressure is normal.  The left ventricular ejection fraction is 55-65% by visual estimate.  There is no aortic valve stenosis.  Continue aggressive secondary prevention. I stressed the importance of DAPT for 1 year. He may need to change from Brilinta to Plavix after 1 month. Would consider lifelong clopidogrel monotherapy after 1 year given his diffuse CAD.    Echocardiogram 03/28/2019:  1. Left ventricular ejection fraction, by visual estimation, is 60 to 65%. The left ventricle has normal function. Normal left ventricular size. There is mildly increased left ventricular hypertrophy. 2. Global right ventricle has normal systolic function.The right ventricular size is normal. 3. Left atrial size was normal. 4. Right atrial size was normal. 5. The mitral valve is normal in structure. No evidence of mitral valve regurgitation. No evidence of mitral stenosis. 6. The tricuspid valve is normal in structure. Tricuspid valve regurgitation was not visualized by color flow Doppler. 7. The aortic valve is tricuspid Aortic valve regurgitation was not visualized by color flow Doppler. Structurally normal aortic valve, with no evidence of sclerosis or stenosis. 8. The pulmonic valve was not well visualized. Pulmonic valve  regurgitation is not visualized by color flow Doppler. 9. The inferior vena cava is normal in size with greater than 50% respiratory variability, suggesting right atrial pressure of 3 mmHg. 10. Technically difficult; definity used; normal LV function; no significant valvular abnormality.   Past Medical History:  Diagnosis Date   Chronic diastolic CHF (congestive heart failure) (HCC) 03/09/2017   Echo 1/18: Mild LVH, EF 60-65, Gr 2 DD, mild LAE; difficult study-no obvious wall motion abnormalities with Definity   CKD (chronic kidney disease)    Coronary artery disease    a. s/pprior myocardial infarction, s/p multipleprior stents placedat outside institutions with last intervention 08/2015 [facility]   Diabetes mellitus (HCC)    Drug addiction in remission [facility])    Erectile dysfunction 03/02/2017   Hyperlipidemia associated with type 2 diabetes mellitus (HCC)    Hypertension    Hypothyroidism    a. adm with profound hypothyroidism near myedema in 04/2017.   MI (myocardial infarction) (HCC) 07/2009   [facility] in [place], [place] for last 2, 1st one  in [place], [place]; total of 6 stents, last one 02/2014    Morbid obesity (HCC) 07/06/2016   Noncompliance with therapeutic plan    a. thyroid med -> profound hypothyroidism 04/2017.   OSA (obstructive sleep apnea) 07/06/2016   Moderate with AHI 21.8/hr by PSG 08/2012 now on CPAP   Prolonged QT interval 05/14/2017   Tobacco abuse    Wide-complex tachycardia (HCC) 05/14/2017    Past Surgical History:  Procedure Laterality Date   APPENDECTOMY     CARDIAC CATHETERIZATION N/A 09/16/2015   Procedure: Left Heart Cath and Coronary Angiography;  Surgeon: Runell Gess, MD;  Location: [facility] INVASIVE CV LAB;  Service: Cardiovascular;  Laterality: N/A;   CARDIAC CATHETERIZATION N/A 09/16/2015   Procedure: Coronary Stent Intervention;  Surgeon: Runell Gess, MD;  Location: [facility] INVASIVE CV LAB;  Service: Cardiovascular;   Laterality: N/A;   CORONARY STENT INTERVENTION N/A 03/27/2019   Procedure: CORONARY STENT INTERVENTION;  Surgeon: Corky Crafts, MD;  Location[facility] INVASIVE CV LAB;  Service: Cardiovascular;  Laterality: N/A;   CORONARY STENT PLACEMENT     LEFT HEART CATH AND CORONARY ANGIOGRAPHY N/A 03/27/2019   Procedure: LEFT HEART CATH AND CORONARY ANGIOGRAPHY;  Surgeon: Corky Crafts, MD;  Location: [facility] INVASIVE CV LAB;  Service: Cardiovascular;  Laterality: N/A;    Current Medications: Current Meds  Medication Sig   aspirin 81 MG tablet Take 1 tablet (81 mg total) by mouth daily.   empagliflozin (JARDIANCE) 10 MG TABS tablet Take 10 mg by mouth daily.   ezetimibe (ZETIA) 10 MG tablet Take 1 tablet (10 mg total) by mouth daily.   famotidine (PEPCID) 40 MG tablet Take 1 tablet (40 mg total) by mouth at bedtime.   fenofibrate 160 MG tablet Take 1 tablet (160 mg total) by mouth daily.   furosemide (LASIX) 80 MG tablet Take 80 mg by mouth 3 (three) times a week. Take 1 tab 3-4 times weekly   glucose blood test strip Use as instructed   Insulin Glargine (LANTUS SOLOSTAR) 100 UNIT/ML Solostar Pen Inject 72 Units into the skin 2 (two) times daily.   Insulin Glargine (LANTUS) 100 UNIT/ML Solostar Pen Inject 38 Units into the skin 3 (three) times daily.   Insulin Pen Needle (PEN NEEDLES) 31G X 6 MM MISC Use as directed   Insulin Syringes, Disposable, U-100 0.5 ML MISC Use as directed   levothyroxine (SYNTHROID) 200 MCG tablet TAKE 1 TABLET (200 MCG TOTAL) BY MOUTH DAILY. (DOSE INCREASE)   losartan (COZAAR) 25 MG tablet Take 1 tablet (25 mg total) by mouth daily.   metFORMIN (GLUCOPHAGE-XR) 500 MG 24 hr tablet Take 1 tablet (500 mg total) by mouth 2 (two) times daily.   metoprolol tartrate (LOPRESSOR) 50 MG tablet Take 1 tablet (50 mg total) by mouth 2 (two) times daily.   nitroGLYCERIN (NITROSTAT) 0.4 MG SL tablet Place 1 tablet (0.4 mg total) under the tongue every 5 (five) minutes  x 3 doses as needed for chest pain.   pantoprazole (PROTONIX) 40 MG tablet Take 1 tablet (40 mg total) by mouth daily.   PRESCRIPTION MEDICATION CPAP- At bedtime   rosuvastatin (CRESTOR) 40 MG tablet Take 1 tablet (40 mg total) by mouth daily.   sildenafil (VIAGRA) 50 MG tablet Take 50 mg by mouth 2 (two) times a week.   ticagrelor (BRILINTA) 90 MG TABS tablet Take 1 tablet (90 mg total) by mouth 2 (two) times daily.   TRUEplus Lancets 28G MISC Use as directed  Allergies:   Other, Levofloxacin, and Nsaids   Social History   Socioeconomic History   Marital status: Divorced    Spouse name: Not on file   Number of children: Not on file   Years of education: Not on file   Highest education level: Not on file  Occupational History   Occupation: Company secretary  Ecologist strain: Not on file   Food insecurity    Worry: Not on file    Inability: Not on file   Transportation needs    Medical: Not on file    Non-medical: Not on file  Tobacco Use   Smoking status: Former Smoker    Types: Cigarettes    Quit date: 01/31/2017    Years since quitting: 2.2   Smokeless tobacco: Never Used   Tobacco comment: quit 01/2017  Substance and Sexual Activity   Alcohol use: No    Alcohol/week: 0.0 standard drinks   Drug use: No   Sexual activity: Not on file  Lifestyle   Physical activity    Days per week: Not on file    Minutes per session: Not on file   Stress: Not on file  Relationships   Social connections    Talks on phone: Not on file    Gets together: Not on file    Attends religious service: Not on file    Active member of club or organization: Not on file    Attends meetings of clubs or organizations: Not on file    Relationship status: Not on file  Other Topics Concern   Not on file  Social History Narrative   Adopted. Very little info on biological parents, ?diabetes.   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 He was adopted. ROS:   Please see the history of present illness.     All other systems reviewed and are negative.   EKG:  EKG is not ordered today.    Recent Labs: 07/05/2018: TSH 2.430 07/09/2018: B Natriuretic Peptide 264.5 03/28/2019: ALT 44 04/02/2019: BUN 25; Creatinine, Ser 1.14; Hemoglobin 12.3; Platelets 311; Potassium 3.6; Sodium 133   Recent Lipid Panel    Component Value Date/Time   CHOL 197 03/26/2019 0345   CHOL 96 (L) 05/23/2018 0952   TRIG 769 (H) 03/26/2019 0345   HDL 30 (L) 03/26/2019 0345   HDL 42 05/23/2018 0952   CHOLHDL 6.6 03/26/2019 0345   VLDL UNABLE TO CALCULATE IF TRIGLYCERIDE OVER 400 mg/dL 16/03/9603 5409   LDLCALC UNABLE TO CALCULATE IF TRIGLYCERIDE OVER 400 mg/dL 81/19/1478 2956   LDLCALC 28 05/23/2018 0952   LDLDIRECT 76.9 03/26/2019 0345    Physical Exam:    VS:  BP 130/80    Pulse 95    Ht  (1.651 m)    Wt (!) 304 lb (137.9 kg)    SpO2 96%    BMI 50.59 kg/m     Wt Readings from Last 6 Encounters:  04/14/19 (!) 304 lb (137.9 kg)  04/11/19 300 lb (136.1 kg)  04/04/19 300 lb (136.1 kg)  04/02/19 (!) 310 lb (140.6 kg)  03/29/19 (!) 308 lb 3.3 oz (139.8 kg)  02/16/19 (!) 310 lb (140.6 kg)     Physical Exam  Constitutional: He is oriented to person, place, and time. No distress.  Obese male  HENT:  Head: Normocephalic and atraumatic.  Neck: Normal range of motion.  Neck supple. No JVD present.  Cardiovascular: Normal rate, regular rhythm, normal heart sounds and intact distal pulses. Exam reveals no gallop and no friction rub.  No murmur heard. Pulmonary/Chest: Effort normal and breath sounds normal. No respiratory distress. He has no wheezes. He has no rales.  Abdominal: Soft. Bowel sounds are normal.  Musculoskeletal: Normal range of motion.        General: Edema present.     Comments: 1+ lower leg edema bilaterally  Neurological: He is alert and  oriented to person, place, and time.  Skin: Skin is warm and dry.  Psychiatric: He has a normal mood and affect. His behavior is normal. Judgment and thought content normal.  Vitals reviewed.   ASSESSMENT:    1. Coronary artery disease involving native coronary artery of native heart without angina pectoris   2. Chronic diastolic CHF (congestive heart failure) (HCC)   3. DM type 2 with diabetic peripheral neuropathy (HCC)   4. Essential (primary) hypertension   5. Hyperlipidemia, unspecified hyperlipidemia type    PLAN:    In order of problems listed above:  1.NSTEMI/CAD s/pmultiple PCIs: -Cathperformed 09/29/2020showeddiffuse CAD in which aPCI to mid RCA and RPAV-1was placed. Post cath recommendations for Brilinta and may need to switch to plavix after 1 month if cost is an issue, recommendation for possible lifelong monotherapy with clopidogrel given diffuse CAD. -Echowith normal LVEF -Vascular duplexperformed given significant bruising at cath site that showed no evidence of pseudoaneurysm or AV fistula. Hematoma was noted measuring 1.92 cm. Groin/testical swelling is improving. -Continue BB, low doselosartanwas added. -Denies recurrent anginal symptoms. Pt has DOE likely related to his large size, short stature.  -Pt advised on risk factor modification including better control of his diabetes, weight loss, heart healthy diet and exercise.  He states that he does not like any vegetables and he knows that he eats far too much but does not know how to reduce his intake.  He was resistant to hearing advice on improving his diet.  He will continue to have significant health problems if he does not make some real changes. -The patient plans to attend cardiac rehab at [facility].  He would like a prescription to exercise in the pool at the [facility].  I told him that I would be happy to provide an "exercise is medicine" referral, but would like for him to get started first  with cardiac rehab and then once he has started and tolerating some exercise, I will make the referral.  He can notify me when he is ready.  2.Chronic diastolic HF: -Echoperformed9/29/2020with normal LVEF -Pt had some LE edema and lasix was continued at discharge (was on 80 mg 2 times daily, but reported taking it 3-4 times weekly). Discharged on 40 mg BID.  -The patient continues to have lower extremity edema and his hands feel tight.  He admits that he is only taking Lasix once a day and often not at all if he has to go out that day.  I advised him to try to take his Lasix to reduce his fluid.  And also limit his sodium intake.  He says that he cooks most of his own food and does not use salt.  3.Uncontrolled DM2: -A1C 12.4 -Reported being off diabetic medication for some time -Will needclose follow-up with PCP andwork on diet/lifestyle changes.  He says that he is taking Lantus insulin.  I advised him that if his diabetes continues to be so out of control he is  at risk for complications such as heart attack, stroke, vision loss, kidney failure and peripheral neuropathy.  He states that he eats a carb centric diet and does not like any vegetables except broccoli.  I hope that he will get some good education and cardiac rehab.  4.HTN: -On Lopressor 50 mg BID with addition of low-dose losartanduring recent hospitalization. BP well controlled today.   5.Social Issues/noncompliance: -Long history of medication noncompliance secondary to financial reasons.  -Pt has no insurance drug coverage. He says that he will not be able to afford Brilinta after his free 30 days.  I will switch him back to Plavix.  He says he still has Plavix at home.  I reminded him not to take both Brilinta with Plavix but after the last day of Brilinta, he can start back on Plavix with initial 300 mg followed by 75 mg daily.  6. HLD:  -On Crestor and Zetia. TG 769, HDL 30, dierect LDL 76.9.  -Fenofibrate  restarted. High blood sugar is likely driving his high triglycerides.  -Will check lipids in mid November. If no improvement, may need lipid clinic referral for possible PCSK9 inhibitor  7. Morbid obesity Body mass index is 50.59 kg/m. -Pt size is affecting his mobility and breathing. As above, we discussed lifestyle modifications.  He does not seem to have much confidence that he can change his eating patterns.   Medication Adjustments/Labs and Tests Ordered: Current medicines are reviewed at length with the patient today.  Concerns regarding medicines are outlined above. Labs and tests ordered and medication changes are outlined in the patient instructions below:  Patient Instructions  Lifestyle Modifications to Prevent and Treat Heart Disease -Recommend heart healthy/Mediterranean diet, with whole grains, fruits, vegetables, fish, lean meats, nuts, olive oil and avocado oil.  -Limit salt intake to less than 2000 mg per day.  -Recommend moderate walking, starting slowly with a few minutes and working up to 3-5 times/week for 30-50 minutes each session. Aim for at least 150 minutes.week. Goal should be pace of 3 miles/hours, or walking 1.5 miles in 30 minutes -Recommend avoidance of tobacco products. Avoid excess alcohol. -Keep blood pressure well controlled, ideally less than 130/80.     Mediterranean Diet A Mediterranean diet refers to food and lifestyle choices that are based on the traditions of countries located on the [employer]. This way of eating has been shown to help prevent certain conditions and improve outcomes for people who have chronic diseases, like kidney disease and heart disease. What are tips for following this plan? Lifestyle  Cook and eat meals together with your family, when possible.  Drink enough fluid to keep your urine clear or pale yellow.  Be physically active every day. This includes: ? Aerobic exercise like running or swimming. ? Leisure  activities like gardening, walking, or housework.  Get 7-8 hours of sleep each night.  If recommended by your health care provider, drink red wine in moderation. This means 1 glass a day for nonpregnant women and 2 glasses a day for men. A glass of wine equals 5 oz (150 mL). Reading food labels   Check the serving size of packaged foods. For foods such as rice and pasta, the serving size refers to the amount of cooked product, not dry.  Check the total fat in packaged foods. Avoid foods that have saturated fat or trans fats.  Check the ingredients list for added sugars, such as corn syrup. Shopping  At the grocery store, buy most of  your food from the areas near the walls of the store. This includes: ? Fresh fruits and vegetables (produce). ? Grains, beans, nuts, and seeds. Some of these may be available in unpackaged forms or large amounts (in bulk). ? Fresh seafood. ? Poultry and eggs. ? Low-fat dairy products.  Buy whole ingredients instead of prepackaged foods.  Buy fresh fruits and vegetables in-season from local farmers markets.  Buy frozen fruits and vegetables in resealable bags.  If you do not have access to quality fresh seafood, buy precooked frozen shrimp or canned fish, such as tuna, salmon, or sardines.  Buy small amounts of raw or cooked vegetables, salads, or olives from the deli or salad bar at your store.  Stock your pantry so you always have certain foods on hand, such as olive oil, canned tuna, canned tomatoes, rice, pasta, and beans. Cooking  Cook foods with extra-virgin olive oil instead of using butter or other vegetable oils.  Have meat as a side dish, and have vegetables or grains as your main dish. This means having meat in small portions or adding small amounts of meat to foods like pasta or stew.  Use beans or vegetables instead of meat in common dishes like chili or lasagna.  Experiment with different cooking methods. Try roasting or broiling  vegetables instead of steaming or sauteing them.  Add frozen vegetables to soups, stews, pasta, or rice.  Add nuts or seeds for added healthy fat at each meal. You can add these to yogurt, salads, or vegetable dishes.  Marinate fish or vegetables using olive oil, lemon juice, garlic, and fresh herbs. Meal planning   Plan to eat 1 vegetarian meal one day each week. Try to work up to 2 vegetarian meals, if possible.  Eat seafood 2 or more times a week.  Have healthy snacks readily available, such as: ? Vegetable sticks with hummus. ? [place] yogurt. ? Fruit and nut trail mix.  Eat balanced meals throughout the week. This includes: ? Fruit: 2-3 servings a day ? Vegetables: 4-5 servings a day ? Low-fat dairy: 2 servings a day ? Fish, poultry, or lean meat: 1 serving a day ? Beans and legumes: 2 or more servings a week ? Nuts and seeds: 1-2 servings a day ? Whole grains: 6-8 servings a day ? Extra-virgin olive oil: 3-4 servings a day  Limit red meat and sweets to only a few servings a month What are my food choices?  Mediterranean diet ? Recommended  Grains: Whole-grain pasta. Brown rice. Bulgar wheat. Polenta. Couscous. Whole-wheat bread. Orpah Cobbatmeal. Quinoa.  Vegetables: Artichokes. Beets. Broccoli. Cabbage. Carrots. Eggplant. Green beans. Chard. Kale. Spinach. Onions. Leeks. Peas. Squash. Tomatoes. Peppers. Radishes.  Fruits: Apples. Apricots. Avocado. Berries. Bananas. Cherries. Dates. Figs. Grapes. Lemons. Melon. Oranges. Peaches. Plums. Pomegranate.  Meats and other protein foods: Beans. Almonds. Sunflower seeds. Pine nuts. Peanuts. Cod. Salmon. Scallops. Shrimp. Tuna. Tilapia. Clams. Oysters. Eggs.  Dairy: Low-fat milk. Cheese. Greek yogurt.  Beverages: Water. Red wine. Herbal tea.  Fats and oils: Extra virgin olive oil. Avocado oil. Grape seed oil.  Sweets and desserts: [place] yogurt with honey. Baked apples. Poached pears. Trail mix.  Seasoning and other foods: Basil.  Cilantro. Coriander. Cumin. Mint. Parsley. Sage. Rosemary. Tarragon. Garlic. Oregano. Thyme. Pepper. Balsalmic vinegar. Tahini. Hummus. Tomato sauce. Olives. Mushrooms. ? Limit these  Grains: Prepackaged pasta or rice dishes. Prepackaged cereal with added sugar.  Vegetables: Deep fried potatoes (french fries).  Fruits: Fruit canned in syrup.  Meats and other protein  foods: Beef. Pork. Lamb. Poultry with skin. Hot dogs. Tomasa Blase.  Dairy: Ice cream. Sour cream. Whole milk.  Beverages: Juice. Sugar-sweetened soft drinks. Beer. Liquor and spirits.  Fats and oils: Butter. Canola oil. Vegetable oil. Beef fat (tallow). Lard.  Sweets and desserts: Cookies. Cakes. Pies. Candy.  Seasoning and other foods: Mayonnaise. Premade sauces and marinades. The items listed may not be a complete list. Talk with your dietitian about what dietary choices are right for you. Summary  The Mediterranean diet includes both food and lifestyle choices.  Eat a variety of fresh fruits and vegetables, beans, nuts, seeds, and whole grains.  Limit the amount of red meat and sweets that you eat.  Talk with your health care provider about whether it is safe for you to drink red wine in moderation. This means 1 glass a day for nonpregnant women and 2 glasses a day for men. A glass of wine equals 5 oz (150 mL). This information is not intended to replace advice given to you by your health care provider. Make sure you discuss any questions you have with your health care provider. Document Released: 02/06/2016 Document Revised: 02/13/2016 Document Reviewed: 02/06/2016 Elsevier Patient Education  2020 [employer].     Signed, Berton Bon, NP  04/14/2019 3:21 PM    [facility]

## 2019-04-15 LAB — BASIC METABOLIC PANEL
BUN/Creatinine Ratio: 22 — ABNORMAL HIGH (ref 9–20)
BUN: 25 mg/dL — ABNORMAL HIGH (ref 6–24)
CO2: 19 mmol/L — ABNORMAL LOW (ref 20–29)
Calcium: 8.8 mg/dL (ref 8.7–10.2)
Chloride: 99 mmol/L (ref 96–106)
Creatinine, Ser: 1.13 mg/dL (ref 0.76–1.27)
GFR calc Af Amer: 88 mL/min/{1.73_m2} (ref >59)
GFR calc non Af Amer: 76 mL/min/{1.73_m2} (ref >59)
Glucose: 561 mg/dL (ref 65–99)
Potassium: 4.9 mmol/L (ref 3.5–5.2)
Sodium: 131 mmol/L — ABNORMAL LOW (ref 134–144)

## 2019-04-18 ENCOUNTER — Other Ambulatory Visit: Payer: Self-pay

## 2019-04-18 ENCOUNTER — Encounter: Payer: Self-pay | Admitting: *Deleted

## 2019-04-18 ENCOUNTER — Ambulatory Visit (HOSPITAL_COMMUNITY)
Admit: 2019-04-18 | Discharge: 2019-04-18 | Disposition: A | Discharge status: Home / Self Care | Payer: Self-pay | Attending: Internal Medicine | Admitting: Internal Medicine

## 2019-04-18 VITALS — BP 142/91 | HR 66

## 2019-04-18 DIAGNOSIS — Z006 Encounter for examination for normal comparison and control in clinical research program: Secondary | ICD-10-CM

## 2019-04-18 MED ORDER — STUDY - AEGIS II STUDY - PLACEBO OR CSL112 (PI-HILTY)
170.0000 mL | Freq: Once | INTRAVENOUS | Status: AC
  Administered 2019-04-18: 170 mL via INTRAVENOUS
  Filled 2019-04-18: qty 170

## 2019-04-20 ENCOUNTER — Other Ambulatory Visit: Payer: Self-pay

## 2019-04-20 ENCOUNTER — Ambulatory Visit: Payer: Self-pay | Attending: Family Medicine | Admitting: Family Medicine

## 2019-04-20 ENCOUNTER — Encounter: Payer: Self-pay | Admitting: Family Medicine

## 2019-04-20 VITALS — BP 136/84 | HR 84 | Temp 98.3°F | Resp 18 | Ht 65.5 in | Wt 303.0 lb

## 2019-04-20 DIAGNOSIS — E1165 Type 2 diabetes mellitus with hyperglycemia: Secondary | ICD-10-CM

## 2019-04-20 DIAGNOSIS — I10 Essential (primary) hypertension: Secondary | ICD-10-CM

## 2019-04-20 DIAGNOSIS — I5032 Chronic diastolic (congestive) heart failure: Secondary | ICD-10-CM

## 2019-04-20 DIAGNOSIS — I25118 Atherosclerotic heart disease of native coronary artery with other forms of angina pectoris: Secondary | ICD-10-CM

## 2019-04-20 DIAGNOSIS — IMO0002 Reserved for concepts with insufficient information to code with codable children: Secondary | ICD-10-CM

## 2019-04-20 DIAGNOSIS — Z6841 Body Mass Index (BMI) 40.0 and over, adult: Secondary | ICD-10-CM

## 2019-04-20 DIAGNOSIS — E1142 Type 2 diabetes mellitus with diabetic polyneuropathy: Secondary | ICD-10-CM

## 2019-04-20 DIAGNOSIS — Z09 Encounter for follow-up examination after completed treatment for conditions other than malignant neoplasm: Secondary | ICD-10-CM

## 2019-04-20 LAB — GLUCOSE, POCT (MANUAL RESULT ENTRY)
POC Glucose: 399 mg/dL — AB (ref 70–99)
POC Glucose: 311 mg/dL — AB (ref 70–99)

## 2019-04-20 NOTE — Progress Notes (Signed)
Subjective:  Patient ID: Marc Walker, male    DOB: October 24, 1970  Age: 48 y.o. MRN: [id number]  CC: Hospitalization Follow-up  Where: [facility] When: 03/25/2019 through 03/29/2019 Discharge diagnoses: Principal problem: NSTEMI (non-- ST elevated myocardial infarction).  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 Jonah Blue, who is seen for hospital follow-up.  Patient reports that a few days prior to hospital admission he had had some increase in fatigue as well as sensation of fullness in his anterior chest/throat but on the day of admission he had onset of intense pain in his jaw and pain in both arms which finally prompted him to call 911 patient had also run out of some of his medications a few days prior to hospital admission.  Patient reports that he feels much better status post hospital admission.  He reports that he has had several heart attacks and has had to have multiple stent placement procedures done.  He is currently on blood thinning medication, Brilinta.  He denies any increased shortness of breath with the use of this medicine.  He is not sure however how long he can continue to afford the medication as he is not working.  He reports that he is unable to work due to his multiple medical issues.          Patient reports that he did not check his blood sugar at home before he had breakfast.  He states that breakfast consisted of a sausage biscuit from [employer] which his parents brought him.  Patient states that he later had a late lunch/breakfast consisting of scrambled eggs, sausage, hashbrowns that his mother made at home.  After he ate lunch, he  checked his blood sugar which was greater than 600 and patient states that he took 4 injections of 60 units each of his short acting insulin in order to help get his blood sugar down.  Patient's blood sugar here at the office is 399.  Patient states that he has never been placed on any type of sliding scale to know how much short acting insulin he should take if his blood sugars are elevated, he just decided that 240 units of short acting insulin should help bring his blood sugars down.  He denies feeling shaky or jittery currently.  He has had increased thirst and increased urinary frequency due to his high blood sugars.  He reports that when he is at home, that he has to eat what his mother and father provide his his mother will not allow him to cook.  When he visits his girlfriend's home however he is allowed to cook and he tries to make healthier foods at that time.         He reports that he now has all of his medications and is compliant with the use of his medications.  He also is compliant most of the time with the use of CPAP.  He uses his CPAP at night though he does not like the way that the mask feels.  He sometimes falls asleep without putting on his CPAP mask during the day.  He denies any current headaches or dizziness related to his blood pressure.  He does not believe that he is having any increased muscle  aches related to his use of statin medication.  He feels the swelling in his legs is greatly improved after his Lasix dose was adjusted during his recent hospitalization.  Past Medical History:  Diagnosis Date  . Chronic diastolic CHF (congestive heart failure) (HCC) 03/09/2017   Echo 1/18: Mild LVH, EF 60-65, Gr 2 DD, mild LAE; difficult study-no obvious wall motion abnormalities with Definity  . CKD (chronic kidney disease)   . Coronary artery disease    a. s/pprior myocardial infarction, s/p multipleprior stents placedat outside institutions with [facility]  .  Diabetes mellitus (HCC)   . Drug addiction in remission (HCC)   . Erectile dysfunction 03/02/2017  . Hyperlipidemia associated with type 2 diabetes mellitus (HCC)   . Hypertension   . Hypothyroidism    [facility]  . MI (myocardial infarction) (HCC) 07/2009   [facility] [place], [place] for last 2, 1st one in [place]; total of 6 stents, last one 02/2014   . Morbid obesity (HCC) 07/06/2016  . Noncompliance with therapeutic plan    a. thyroid med -> profound hypothyroidism 04/2017.  Marland Kitchen. OSA (obstructive sleep apnea) 07/06/2016   Moderate with AHI 21.8/hr by PSG 08/2012 now on CPAP  . Prolonged QT interval 05/14/2017  . Tobacco abuse   . Wide-complex tachycardia (HCC) 05/14/2017    Past Surgical History:  Procedure Laterality Date  . APPENDECTOMY    . CARDIAC CATHETERIZATION N/A 09/16/2015   Procedure: Left Heart Cath and Coronary Angiography;  Surgeon: Runell GessJonathan J Berry, MD;  [facility];  Service: Cardiovascular;  Laterality: N/A;  . CARDIAC CATHETERIZATION N/A 09/16/2015   Procedure: Coronary Stent Intervention;  Surgeon: Runell GessJonathan J Berry, MD;  Location: [facility] INVASIVE CV LAB;  Service: Cardiovascular;  Laterality: N/A;  . CORONARY STENT INTERVENTION N/A 03/27/2019   Procedure: CORONARY STENT INTERVENTION;  Surgeon: Corky CraftsVaranasi, Jayadeep S, MD;  Location: [facility] INVASIVE CV LAB;  Service: Cardiovascular;  Laterality: N/A;  . CORONARY STENT PLACEMENT    . LEFT HEART CATH AND CORONARY ANGIOGRAPHY N/A 03/27/2019   Procedure: LEFT HEART CATH AND CORONARY ANGIOGRAPHY;  Surgeon: Corky CraftsVaranasi, Jayadeep S, MD;  [facility];  Service: Cardiovascular;  Laterality: N/A;    Family History  Adopted: Yes  Problem Relation Age of Onset  . Diabetes Maternal Grandmother     Social History   Tobacco Use  . Smoking status: Former Smoker    Types: Cigarettes    Quit date: 01/31/2017    Years since quitting: 2.2  . Smokeless  tobacco: Never Used  . Tobacco comment: quit 01/2017  Substance Use Topics  . Alcohol use: No    Alcohol/week: 0.0 standard drinks    ROS Review of Systems  Constitutional: Positive for fatigue. Negative for chills and fever.  HENT: Negative for sore throat and trouble swallowing.   Eyes: Negative for photophobia and visual disturbance.  Respiratory: Negative for cough and shortness of breath.   Cardiovascular: Positive for leg swelling (Reports chronic lower extremity edema though improved status post hospitalization). Negative for chest pain and palpitations.  Gastrointestinal: Negative for abdominal pain, blood in stool, constipation, diarrhea and nausea.  Endocrine: Positive for polydipsia, polyphagia and polyuria. Negative for cold intolerance and heat intolerance.  Genitourinary: Positive for frequency (Due to use of fluid pills and elevated blood sugar). Negative for dysuria.  Musculoskeletal: Positive for arthralgias and gait problem.  Neurological: Negative for dizziness and  headaches.  Hematological: Negative for adenopathy. Does not bruise/bleed easily.  Psychiatric/Behavioral: Negative for self-injury and suicidal ideas. The patient is nervous/anxious.     Objective:   Today's Vitals: BP 136/84 (BP Location: Left Arm, Patient Position: Sitting, Cuff Size: Normal)   Pulse 84   Temp 98.3 F (36.8 C) (Oral)   Resp 18   Ht 5' 5.5" (1.664 m)   Wt (!) 303 lb (137.4 kg)   SpO2 98%   BMI 49.65 kg/m   Physical Exam Vitals signs and nursing note reviewed.  Constitutional:      Appearance: Normal appearance. He is obese. He is not ill-appearing.     Comments: Well-nourished well-developed morbidly obese male in no acute distress who looks slightly older than stated age, who is seated on exam chair  Neck:     Musculoskeletal: Normal range of motion and neck supple. No neck rigidity or muscular tenderness.     Vascular: No carotid bruit.  Cardiovascular:     Rate and  Rhythm: Normal rate and regular rhythm.     Comments: No JVD Pulmonary:     Effort: Pulmonary effort is normal.     Breath sounds: Normal breath sounds. No rales.     Comments: Decreased air movement and decreased breath sounds especially at the bases which is likely secondary to body habitus/obesity Abdominal:     Palpations: Abdomen is soft.     Tenderness: There is no abdominal tenderness. There is no right CVA tenderness, left CVA tenderness, guarding or rebound.     Comments: Truncal obesity  Musculoskeletal:        General: Swelling present.     Right lower leg: Edema present.     Left lower leg: Edema present.     Comments: Bilateral lower extremity edema which is nonpitting and skin is soft  Lymphadenopathy:     Cervical: No cervical adenopathy.  Neurological:     General: No focal deficit present.     Mental Status: He is alert and oriented to person, place, and time.  Psychiatric:        Mood and Affect: Mood normal.        Behavior: Behavior normal.     Assessment & Plan:  1. Uncontrolled type 2 diabetes mellitus with peripheral neuropathy (HCC) Most recent hemoglobin A1c was 12.4 on 02/20/2019 indicating that blood sugar has been poorly controlled.  Patient's glucose at today's visit was initially 399 and was later rechecked and had further reduced to 311.  He was not given insulin here in the office as he reported that he had taken 240 units of regular insulin within the last hour and a half after discovering that his post lunch blood sugar was near 600.  Discussed with patient that he should not take high amounts of short acting insulin without discussing with his provider.  Patient was given information on carbohydrate counting and given directions on a simple sliding scale for regular insulin and patient agrees to return to meet with the clinical pharmacist regarding his diabetes and control of blood sugars. - Glucose (CBG) - Amb Referral to Clinical Pharmacist - Glucose  (CBG)  2. Essential hypertension Patient's blood pressure was under 140/90 at today's visit and he will continue his current medications status post hospital discharge and keep follow-up with cardiology.  He will have BMP at today's visit to recheck electrolytes due to the use of medications for hypertension and CHF. - Basic Metabolic Panel  3. Chronic diastolic CHF (congestive  heart failure) (HCC) Patient with chronic diastolic CHF which appears to be stable.  He denies any orthopnea and does not have any rales on exam and patient with lower extremity edema that appears chronic and is currently nonpitting.  4. Coronary artery disease of native artery of native heart with stable angina pectoris (HCC); 5.  NSTEMI; 6 Hospital discharge follow-up; 7.  Morbid obesity Patient's hospital admission, discharge, procedure notes and labs as well as imaging reviewed and discussed with the patient at today's visit.  He reports that this time he does have all of his medications.  He has had no significant chest pain, fullness in the neck, jaw pain or pain in the arms or upper back since his hospitalization and heart cath with stenting.  He is to continue cardiology follow-up and to try and reduce secondary risk factors by attempting to gain control of his blood sugars/diabetes, make dietary changes and follow a low carbohydrate, low-sodium and low-fat diet, he is encouraged to contact his cardiologist regarding cardiac rehabilitation program due to deconditioning and morbid obesity and he is encouraged to remain compliant with all medications.  Blood pressure is controlled to today's visit on his current medications.  Continue cardiology follow-up.  Outpatient Encounter Medications as of 04/20/2019  Medication Sig  . aspirin 81 MG tablet Take 1 tablet (81 mg total) by mouth daily.  Melene Muller ON 04/28/2019] clopidogrel (PLAVIX) 75 MG tablet Take 1 tablet (75 mg total) by mouth daily.  . empagliflozin (JARDIANCE) 10  MG TABS tablet Take 10 mg by mouth daily.  Marland Kitchen ezetimibe (ZETIA) 10 MG tablet Take 1 tablet (10 mg total) by mouth daily.  . famotidine (PEPCID) 40 MG tablet Take 1 tablet (40 mg total) by mouth at bedtime.  . fenofibrate 160 MG tablet Take 1 tablet (160 mg total) by mouth daily.  . furosemide (LASIX) 80 MG tablet Take 80 mg by mouth 3 (three) times a week. Take 1 tab 3-4 times weekly  . glucose blood test strip Use as instructed  . Insulin Pen Needle (PEN NEEDLES) 31G X 6 MM MISC Use as directed  . Insulin Syringes, Disposable, U-100 0.5 ML MISC Use as directed  . levothyroxine (SYNTHROID) 200 MCG tablet TAKE 1 TABLET (200 MCG TOTAL) BY MOUTH DAILY. (DOSE INCREASE)  . losartan (COZAAR) 25 MG tablet Take 1 tablet (25 mg total) by mouth daily.  . metFORMIN (GLUCOPHAGE-XR) 500 MG 24 hr tablet Take 1 tablet (500 mg total) by mouth 2 (two) times daily.  . metoprolol tartrate (LOPRESSOR) 50 MG tablet Take 1 tablet (50 mg total) by mouth 2 (two) times daily.  . nitroGLYCERIN (NITROSTAT) 0.4 MG SL tablet Place 1 tablet (0.4 mg total) under the tongue every 5 (five) minutes x 3 doses as needed for chest pain.  . pantoprazole (PROTONIX) 40 MG tablet Take 1 tablet (40 mg total) by mouth daily.  Marland Kitchen PRESCRIPTION MEDICATION CPAP- At bedtime  . rosuvastatin (CRESTOR) 40 MG tablet Take 1 tablet (40 mg total) by mouth daily.  . sildenafil (VIAGRA) 50 MG tablet Take 50 mg by mouth 2 (two) times a week.  . ticagrelor (BRILINTA) 90 MG TABS tablet Take 1 tablet (90 mg total) by mouth 2 (two) times daily.  . TRUEplus Lancets 28G MISC Use as directed  . Insulin Glargine (LANTUS SOLOSTAR) 100 UNIT/ML Solostar Pen Inject 72 Units into the skin 2 (two) times daily.  . Insulin Glargine (LANTUS) 100 UNIT/ML Solostar Pen Inject 38 Units into the skin 3 (  three) times daily.   No facility-administered encounter medications on file as of 04/20/2019.    An After Visit Summary was printed and given to the  patient.   Follow-up: Return in about 4 weeks (around 05/18/2019) for DM/CAD- Dr. Wynetta Emery.   Antony Blackbird MD

## 2019-04-20 NOTE — Patient Instructions (Signed)
Carbohydrate Counting for Diabetes Mellitus, Adult  Carbohydrate counting is a method of keeping track of how many carbohydrates you eat. Eating carbohydrates naturally increases the amount of sugar (glucose) in the blood. Counting how many carbohydrates you eat helps keep your blood glucose within normal limits, which helps you manage your diabetes (diabetes mellitus). It is important to know how many carbohydrates you can safely have in each meal. This is different for every person. A diet and nutrition specialist (registered dietitian) can help you make a meal plan and calculate how many carbohydrates you should have at each meal and snack. Carbohydrates are found in the following foods:  Grains, such as breads and cereals.  Dried beans and soy products.  Starchy vegetables, such as potatoes, peas, and corn.  Fruit and fruit juices.  Milk and yogurt.  Sweets and snack foods, such as cake, cookies, candy, chips, and soft drinks. How do I count carbohydrates? There are two ways to count carbohydrates in food. You can use either of the methods or a combination of both. Reading "Nutrition Facts" on packaged food The "Nutrition Facts" list is included on the labels of almost all packaged foods and beverages in the [place] It includes:  The serving size.  Information about nutrients in each serving, including the grams (g) of carbohydrate per serving. To use the "Nutrition Facts":  Decide how many servings you will have.  Multiply the number of servings by the number of carbohydrates per serving.  The resulting number is the total amount of carbohydrates that you will be having. Learning standard serving sizes of other foods When you eat carbohydrate foods that are not packaged or do not include "Nutrition Facts" on the label, you need to measure the servings in order to count the amount of carbohydrates:  Measure the foods that you will eat with a food scale or measuring cup, if  needed.  Decide how many standard-size servings you will eat.  Multiply the number of servings by 15. Most carbohydrate-rich foods have about 15 g of carbohydrates per serving. ? For example, if you eat 8 oz (170 g) of strawberries, you will have eaten 2 servings and 30 g of carbohydrates (2 servings x 15 g = 30 g).  For foods that have more than one food mixed, such as soups and casseroles, you must count the carbohydrates in each food that is included. The following list contains standard serving sizes of common carbohydrate-rich foods. Each of these servings has about 15 g of carbohydrates:   hamburger bun or  English muffin.   oz (15 mL) syrup.   oz (14 g) jelly.  1 slice of bread.  1 six-inch tortilla.  3 oz (85 g) cooked rice or pasta.  4 oz (113 g) cooked dried beans.  4 oz (113 g) starchy vegetable, such as peas, corn, or potatoes.  4 oz (113 g) hot cereal.  4 oz (113 g) mashed potatoes or  of a large baked potato.  4 oz (113 g) canned or frozen fruit.  4 oz (120 mL) fruit juice.  4-6 crackers.  6 chicken nuggets.  6 oz (170 g) unsweetened dry cereal.  6 oz (170 g) plain fat-free yogurt or yogurt sweetened with artificial sweeteners.  8 oz (240 mL) milk.  8 oz (170 g) fresh fruit or one small piece of fruit.  24 oz (680 g) popped popcorn. Example of carbohydrate counting Sample meal  3 oz (85 g) chicken breast.  6 oz (170 g)   brown rice.  4 oz (113 g) corn.  8 oz (240 mL) milk.  8 oz (170 g) strawberries with sugar-free whipped topping. Carbohydrate calculation 1. Identify the foods that contain carbohydrates: ? Rice. ? Corn. ? Milk. ? Strawberries. 2. Calculate how many servings you have of each food: ? 2 servings rice. ? 1 serving corn. ? 1 serving milk. ? 1 serving strawberries. 3. Multiply each number of servings by 15 g: ? 2 servings rice x 15 g = 30 g. ? 1 serving corn x 15 g = 15 g. ? 1 serving milk x 15 g = 15 g. ? 1  serving strawberries x 15 g = 15 g. 4. Add together all of the amounts to find the total grams of carbohydrates eaten: ? 30 g + 15 g + 15 g + 15 g = 75 g of carbohydrates total. Summary  Carbohydrate counting is a method of keeping track of how many carbohydrates you eat.  Eating carbohydrates naturally increases the amount of sugar (glucose) in the blood.  Counting how many carbohydrates you eat helps keep your blood glucose within normal limits, which helps you manage your diabetes.  A diet and nutrition specialist (registered dietitian) can help you make a meal plan and calculate how many carbohydrates you should have at each meal and snack. This information is not intended to replace advice given to you by your health care provider. Make sure you discuss any questions you have with your health care provider. Document Released: 06/15/2005 Document Revised: 01/07/2017 Document Reviewed: 11/27/2015 Elsevier Patient Education  2020 Elsevier Inc.  Blood Glucose Monitoring, Adult Monitoring your blood sugar (glucose) is an important part of managing your diabetes (diabetes mellitus). Blood glucose monitoring involves checking your blood glucose as often as directed and keeping a record (log) of your results over time. Checking your blood glucose regularly and keeping a blood glucose log can:  Help you and your health care provider adjust your diabetes management plan as needed, including your medicines or insulin.  Help you understand how food, exercise, illnesses, and medicines affect your blood glucose.  Let you know what your blood glucose is at any time. You can quickly find out if you have low blood glucose (hypoglycemia) or high blood glucose (hyperglycemia). Your health care provider will set individualized treatment goals for you. Your goals will be based on your age, other medical conditions you have, and how you respond to diabetes treatment. Generally, the goal of treatment is to  maintain the following blood glucose levels:  Before meals (preprandial): 80-130 mg/dL (5.4-0.9 mmol/L).  After meals (postprandial): below 180 mg/dL (10 mmol/L).  A1c level: less than 7%. Supplies needed:  Blood glucose meter.  Test strips for your meter. Each meter has its own strips. You must use the strips that came with your meter.  A needle to prick your finger (lancet). Do not use a lancet more than one time.  A device that holds the lancet (lancing device).  A journal or log book to write down your results. How to check your blood glucose  1. Wash your hands with soap and water. 2. Prick the side of your finger (not the tip) with the lancet. Use a different finger each time. 3. Gently rub the finger until a small drop of blood appears. 4. Follow instructions that come with your meter for inserting the test strip, applying blood to the strip, and using your blood glucose meter. 5. Write down your result and any  notes. Some meters allow you to use areas of your body other than your finger (alternative sites) to test your blood. The most common alternative sites are:  Forearm.  Thigh.  Palm of the hand. If you think you may have hypoglycemia, or if you have a history of not knowing when your blood glucose is getting low (hypoglycemia unawareness), do not use alternative sites. Use your finger instead. Alternative sites may not be as accurate as the fingers, because blood flow is slower in these areas. This means that the result you get may be delayed, and it may be different from the result that you would get from your finger. Follow these instructions at home: Blood glucose log   Every time you check your blood glucose, write down your result. Also write down any notes about things that may be affecting your blood glucose, such as your diet and exercise for the day. This information can help you and your health care provider: ? Look for patterns in your blood glucose over  time. ? Adjust your diabetes management plan as needed.  Check if your meter allows you to download your records to a computer. Most glucose meters store a record of glucose readings in the meter. If you have type 1 diabetes:  Check your blood glucose 2 or more times a day.  Also check your blood glucose: ? Before every insulin injection. ? Before and after exercise. ? Before meals. ? 2 hours after a meal. ? Occasionally between 2:00 a.m. and 3:00 a.m., as directed. ? Before potentially dangerous tasks, like driving or using heavy machinery. ? At bedtime.  You may need to check your blood glucose more often, up to 6-10 times a day, if you: ? Use an insulin pump. ? Need multiple daily injections (MDI). ? Have diabetes that is not well-controlled. ? Are ill. ? Have a history of severe hypoglycemia. ? Have hypoglycemia unawareness. If you have type 2 diabetes:  If you take insulin or other diabetes medicines, check your blood glucose 2 or more times a day.  If you are on intensive insulin therapy, check your blood glucose 4 or more times a day. Occasionally, you may also need to check between 2:00 a.m. and 3:00 a.m., as directed.  Also check your blood glucose: ? Before and after exercise. ? Before potentially dangerous tasks, like driving or using heavy machinery.  You may need to check your blood glucose more often if: ? Your medicine is being adjusted. ? Your diabetes is not well-controlled. ? You are ill. General tips  Always keep your supplies with you.  If you have questions or need help, all blood glucose meters have a 24-hour "hotline" phone number that you can call. You may also contact your health care provider.  After you use a few boxes of test strips, adjust (calibrate) your blood glucose meter by following instructions that came with your meter. Contact a health care provider if:  Your blood glucose is at or above 240 mg/dL (13.3 mmol/L) for 2 days in a  row.  You have been sick or have had a fever for 2 days or longer, and you are not getting better.  You have any of the following problems for more than 6 hours: ? You cannot eat or drink. ? You have nausea or vomiting. ? You have diarrhea. Get help right away if:  Your blood glucose is lower than 54 mg/dL (3 mmol/L).  You become confused or you have trouble thinking  clearly.  You have difficulty breathing.  You have moderate or large ketone levels in your urine. Summary  Monitoring your blood sugar (glucose) is an important part of managing your diabetes (diabetes mellitus).  Blood glucose monitoring involves checking your blood glucose as often as directed and keeping a record (log) of your results over time.  Your health care provider will set individualized treatment goals for you. Your goals will be based on your age, other medical conditions you have, and how you respond to diabetes treatment.  Every time you check your blood glucose, write down your result. Also write down any notes about things that may be affecting your blood glucose, such as your diet and exercise for the day. This information is not intended to replace advice given to you by your health care provider. Make sure you discuss any questions you have with your health care provider. Document Released: 06/18/2003 Document Revised: 04/08/2018 Document Reviewed: 11/25/2015 Elsevier Patient Education  2020 [employer]

## 2019-04-20 NOTE — Progress Notes (Signed)
Patient ate a big breakfast for lunch and shared his CBG was over 600. Patient states he took 4 shots of 60 units prior to coming into the office.

## 2019-04-21 LAB — BASIC METABOLIC PANEL WITH GFR
BUN/Creatinine Ratio: 28 — ABNORMAL HIGH (ref 9–20)
BUN: 29 mg/dL — ABNORMAL HIGH (ref 6–24)
CO2: 18 mmol/L — ABNORMAL LOW (ref 20–29)
Calcium: 9.9 mg/dL (ref 8.7–10.2)
Chloride: 106 mmol/L (ref 96–106)
Creatinine, Ser: 1.03 mg/dL (ref 0.76–1.27)
GFR calc Af Amer: 98 mL/min/1.73
GFR calc non Af Amer: 85 mL/min/1.73
Glucose: 272 mg/dL — ABNORMAL HIGH (ref 65–99)
Potassium: 4.6 mmol/L (ref 3.5–5.2)
Sodium: 138 mmol/L (ref 134–144)

## 2019-04-25 ENCOUNTER — Encounter: Payer: Self-pay | Admitting: *Deleted

## 2019-04-25 ENCOUNTER — Other Ambulatory Visit: Payer: Self-pay

## 2019-04-25 VITALS — BP 134/62 | HR 58 | Wt 303.9 lb

## 2019-04-25 DIAGNOSIS — Z006 Encounter for examination for normal comparison and control in clinical research program: Secondary | ICD-10-CM

## 2019-04-25 NOTE — Research (Signed)
Aegis V6  Patient doing well, no complaints of cp or sob. Swelling better at [facility] site, dr Riley Kill assessed today. No med changes per patient.                                     "CONSENT"   YES     NO   Continuing further Investigational Product and study visits for follow-up? [x]  []   Continuing consent from future biomedical research [x]  []                                    "EVENTS"    YES     NO  AE   (IF YES SEE SOURCE) []  [x]   SAE  (IF YES SEE SOURCE) []  [x]   ENDPOINT   (IF YES SEE SOURCE) []  [x]   REVASCULARIZATION  (IF YES SEE SOURCE) []  [x]   AMPUTATION   (IF YES SEE SOURCE) []  [x]   TROPONIN'S  (IF YES SEE SOURCE) []  [x]          Current Outpatient Medications:  .  aspirin 81 MG tablet, Take 1 tablet (81 mg total) by mouth daily., Disp: 90 tablet, Rfl: 3 .  [START ON 04/28/2019] clopidogrel (PLAVIX) 75 MG tablet, Take 1 tablet (75 mg total) by mouth daily., Disp: 90 tablet, Rfl: 3 .  empagliflozin (JARDIANCE) 10 MG TABS tablet, Take 10 mg by mouth daily., Disp: 90 tablet, Rfl: 3 .  ezetimibe (ZETIA) 10 MG tablet, Take 1 tablet (10 mg total) by mouth daily., Disp: 90 tablet, Rfl: 3 .  famotidine (PEPCID) 40 MG tablet, Take 1 tablet (40 mg total) by mouth at bedtime., Disp: 90 tablet, Rfl: 0 .  fenofibrate 160 MG tablet, Take 1 tablet (160 mg total) by mouth daily., Disp: 60 tablet, Rfl: 2 .  furosemide (LASIX) 80 MG tablet, Take 80 mg by mouth 3 (three) times a week. Take 1 tab 3-4 times weekly, Disp: , Rfl:  .  glucose blood test strip, Use as instructed, Disp: 100 each, Rfl: 12 .  Insulin Glargine (LANTUS SOLOSTAR) 100 UNIT/ML Solostar Pen, Inject 72 Units into the skin 2 (two) times daily., Disp: 5 pen, Rfl: 11 .  Insulin Glargine (LANTUS) 100 UNIT/ML Solostar Pen, Inject 38 Units into the skin 3 (three) times daily., Disp: , Rfl:  .  Insulin Pen Needle (PEN NEEDLES) 31G X 6 MM MISC, Use as directed, Disp: 100 each, Rfl: 6 .  Insulin Syringes, Disposable, U-100 0.5 ML  MISC, Use as directed, Disp: 100 each, Rfl: 11 .  levothyroxine (SYNTHROID) 200 MCG tablet, TAKE 1 TABLET (200 MCG TOTAL) BY MOUTH DAILY. (DOSE INCREASE), Disp: 30 tablet, Rfl: 6 .  losartan (COZAAR) 25 MG tablet, Take 1 tablet (25 mg total) by mouth daily., Disp: 60 tablet, Rfl: 2 .  metFORMIN (GLUCOPHAGE-XR) 500 MG 24 hr tablet, Take 1 tablet (500 mg total) by mouth 2 (two) times daily., Disp: 60 tablet, Rfl: 11 .  metoprolol tartrate (LOPRESSOR) 50 MG tablet, Take 1 tablet (50 mg total) by mouth 2 (two) times daily., Disp: 180 tablet, Rfl: 3 .  nitroGLYCERIN (NITROSTAT) 0.4 MG SL tablet, Place 1 tablet (0.4 mg total) under the tongue every 5 (five) minutes x 3 doses as needed for chest pain., Disp: 25 tablet, Rfl: 0 .  pantoprazole (PROTONIX) 40 MG tablet, Take 1 tablet (40 mg  total) by mouth daily., Disp: 90 tablet, Rfl: 3 .  PRESCRIPTION MEDICATION, CPAP- At bedtime, Disp: , Rfl:  .  rosuvastatin (CRESTOR) 40 MG tablet, Take 1 tablet (40 mg total) by mouth daily., Disp: 30 tablet, Rfl: 11 .  sildenafil (VIAGRA) 50 MG tablet, Take 50 mg by mouth 2 (two) times a week., Disp: , Rfl:  .  ticagrelor (BRILINTA) 90 MG TABS tablet, Take 1 tablet (90 mg total) by mouth 2 (two) times daily., Disp: 120 tablet, Rfl: 3 .  TRUEplus Lancets 28G MISC, Use as directed, Disp: 100 each, Rfl: 12

## 2019-05-01 NOTE — Research (Signed)
AEGIS V5  Patient doing well, no complaints of cp or sob. He said his hematoma is doing better still some swollen.                                    "CONSENT"   YES     NO   Continuing further Investigational Product and study visits for follow-up? [x]  []   Continuing consent from future biomedical research [x]  []                                    "EVENTS"    YES     NO  AE   (IF YES SEE SOURCE) []  [x]   SAE  (IF YES SEE SOURCE) []  [x]   ENDPOINT   (IF YES SEE SOURCE) []  [x]   REVASCULARIZATION  (IF YES SEE SOURCE) []  [x]   AMPUTATION   (IF YES SEE SOURCE) []  [x]   TROPONIN'S  (IF YES SEE SOURCE) []  [x]    Central lab PK drawn at this visit:     Current Outpatient Medications:  .  aspirin 81 MG tablet, Take 1 tablet (81 mg total) by mouth daily., Disp: 90 tablet, Rfl: 3 .  clopidogrel (PLAVIX) 75 MG tablet, Take 1 tablet (75 mg total) by mouth daily., Disp: 90 tablet, Rfl: 3 .  empagliflozin (JARDIANCE) 10 MG TABS tablet, Take 10 mg by mouth daily., Disp: 90 tablet, Rfl: 3 .  ezetimibe (ZETIA) 10 MG tablet, Take 1 tablet (10 mg total) by mouth daily., Disp: 90 tablet, Rfl: 3 .  famotidine (PEPCID) 40 MG tablet, Take 1 tablet (40 mg total) by mouth at bedtime., Disp: 90 tablet, Rfl: 0 .  fenofibrate 160 MG tablet, Take 1 tablet (160 mg total) by mouth daily., Disp: 60 tablet, Rfl: 2 .  furosemide (LASIX) 80 MG tablet, Take 80 mg by mouth 3 (three) times a week. Take 1 tab 3-4 times weekly, Disp: , Rfl:  .  glucose blood test strip, Use as instructed, Disp: 100 each, Rfl: 12 .  Insulin Glargine (LANTUS SOLOSTAR) 100 UNIT/ML Solostar Pen, Inject 72 Units into the skin 2 (two) times daily., Disp: 5 pen, Rfl: 11 .  Insulin Glargine (LANTUS) 100 UNIT/ML Solostar Pen, Inject 38 Units into the skin 3 (three) times daily., Disp: , Rfl:  .  Insulin Pen Needle (PEN NEEDLES) 31G X 6 MM MISC, Use as directed, Disp: 100 each, Rfl: 6 .  Insulin Syringes, Disposable, U-100 0.5 ML MISC, Use as directed,  Disp: 100 each, Rfl: 11 .  levothyroxine (SYNTHROID) 200 MCG tablet, TAKE 1 TABLET (200 MCG TOTAL) BY MOUTH DAILY. (DOSE INCREASE), Disp: 30 tablet, Rfl: 6 .  losartan (COZAAR) 25 MG tablet, Take 1 tablet (25 mg total) by mouth daily., Disp: 60 tablet, Rfl: 2 .  metFORMIN (GLUCOPHAGE-XR) 500 MG 24 hr tablet, Take 1 tablet (500 mg total) by mouth 2 (two) times daily., Disp: 60 tablet, Rfl: 11 .  metoprolol tartrate (LOPRESSOR) 50 MG tablet, Take 1 tablet (50 mg total) by mouth 2 (two) times daily., Disp: 180 tablet, Rfl: 3 .  nitroGLYCERIN (NITROSTAT) 0.4 MG SL tablet, Place 1 tablet (0.4 mg total) under the tongue every 5 (five) minutes x 3 doses as needed for chest pain., Disp: 25 tablet, Rfl: 0 .  pantoprazole (PROTONIX) 40 MG tablet, Take 1 tablet (40 mg total) by mouth  daily., Disp: 90 tablet, Rfl: 3 .  PRESCRIPTION MEDICATION, CPAP- At bedtime, Disp: , Rfl:  .  rosuvastatin (CRESTOR) 40 MG tablet, Take 1 tablet (40 mg total) by mouth daily., Disp: 30 tablet, Rfl: 11 .  sildenafil (VIAGRA) 50 MG tablet, Take 50 mg by mouth 2 (two) times a week., Disp: , Rfl:  .  ticagrelor (BRILINTA) 90 MG TABS tablet, Take 1 tablet (90 mg total) by mouth 2 (two) times daily., Disp: 120 tablet, Rfl: 3 .  TRUEplus Lancets 28G MISC, Use as directed, Disp: 100 each, Rfl: 12

## 2019-05-15 ENCOUNTER — Encounter: Payer: Self-pay | Admitting: Internal Medicine

## 2019-05-15 ENCOUNTER — Other Ambulatory Visit: Payer: Self-pay | Admitting: *Deleted

## 2019-05-15 ENCOUNTER — Other Ambulatory Visit: Payer: Self-pay

## 2019-05-15 DIAGNOSIS — E785 Hyperlipidemia, unspecified: Secondary | ICD-10-CM

## 2019-05-15 LAB — LIPID PANEL
Chol/HDL Ratio: 8.7 ratio — ABNORMAL HIGH (ref 0.0–5.0)
Cholesterol, Total: 312 mg/dL — ABNORMAL HIGH (ref 100–199)
HDL: 36 mg/dL — ABNORMAL LOW (ref >39)
LDL Chol Calc (NIH): 139 mg/dL — ABNORMAL HIGH (ref 0–99)
Triglycerides: 712 mg/dL (ref 0–149)
VLDL Cholesterol Cal: 137 mg/dL — ABNORMAL HIGH (ref 5–40)

## 2019-05-17 ENCOUNTER — Telehealth: Payer: Self-pay

## 2019-05-17 NOTE — Telephone Encounter (Signed)
Per Pecolia Ades, NP to refer pt to the lipid clinic. Appointment made for 05/31/2019

## 2019-05-19 NOTE — Research (Signed)
Patient seen in visit 6.  Stable at present time. Had groin hematoma which is improved, but still some mild testicular swelling.  Has been seen in clinic.  No major cardiac symptoms.   VS as noted.  Lungs clear.  Cardiac rhythm regular.  No major murmurs.  Ext mild bilateral edema Groin and testicles soft not firm, no major mass.   Discussed purpose of AEGIS II trial with patient.  NYHA Class 1-2  Loretha Brasil. Lia Foyer, MD, [facility], [place] Director, [facility]

## 2019-05-22 ENCOUNTER — Encounter: Payer: Self-pay | Admitting: *Deleted

## 2019-05-22 ENCOUNTER — Other Ambulatory Visit: Payer: Self-pay | Admitting: Family Medicine

## 2019-05-22 ENCOUNTER — Ambulatory Visit: Payer: Self-pay | Attending: Internal Medicine

## 2019-05-22 ENCOUNTER — Ambulatory Visit: Payer: Self-pay | Attending: Internal Medicine | Admitting: Licensed Clinical Social Worker

## 2019-05-22 ENCOUNTER — Other Ambulatory Visit: Payer: Self-pay

## 2019-05-22 ENCOUNTER — Telehealth: Payer: Self-pay | Admitting: Internal Medicine

## 2019-05-22 DIAGNOSIS — IMO0002 Reserved for concepts with insufficient information to code with codable children: Secondary | ICD-10-CM

## 2019-05-22 DIAGNOSIS — E1142 Type 2 diabetes mellitus with diabetic polyneuropathy: Secondary | ICD-10-CM

## 2019-05-22 DIAGNOSIS — F439 Reaction to severe stress, unspecified: Secondary | ICD-10-CM

## 2019-05-22 DIAGNOSIS — Z006 Encounter for examination for normal comparison and control in clinical research program: Secondary | ICD-10-CM

## 2019-05-22 MED FILL — PANTOPRAZOLE SOD DR 40 MG T: 40 | 30 days supply | Qty: 30 | Fill #2

## 2019-05-22 MED FILL — FENOFIBRATE 160 MG TABLET: 160 | 30 days supply | Qty: 30 | Fill #0

## 2019-05-22 MED FILL — ?ROSUVASTATIN CALCIUM 40MG: 40 | 30 days supply | Qty: 30 | Fill #3

## 2019-05-22 MED FILL — LOSARTAN POTASSIUM 25 MG TA: 25 | 30 days supply | Qty: 30 | Fill #0

## 2019-05-22 MED FILL — ?LEVOTHYROXINE 200 MCG TAB: 200 MCG | 30 days supply | Qty: 30 | Fill #3

## 2019-05-22 MED FILL — !BRILINTA 90 MG TABLET: 30 days supply | Qty: 60 | Fill #0

## 2019-05-22 NOTE — Telephone Encounter (Signed)
Patient needs Dr. Harrington Challenger or a nurse to go over his medication list to see which ones need to be refilled.

## 2019-05-22 NOTE — Research (Signed)
AEGIS V7  Phone visit completed. No complaints of cp or sob. No med changes.                                    "CONSENT"   YES     NO   Continuing further Investigational Product and study visits for follow-up? [x]  []   Continuing consent from future biomedical research [x]  []                                    "EVENTS"    YES     NO  AE   (IF YES SEE SOURCE) []  [x]   SAE  (IF YES SEE SOURCE) []  [x]   ENDPOINT   (IF YES SEE SOURCE) []  [x]   REVASCULARIZATION  (IF YES SEE SOURCE) []  [x]   AMPUTATION   (IF YES SEE SOURCE) []  [x]   TROPONIN'S  (IF YES SEE SOURCE) []  [x]    Current Outpatient Medications:  .  aspirin 81 MG tablet, Take 1 tablet (81 mg total) by mouth daily., Disp: 90 tablet, Rfl: 3 .  clopidogrel (PLAVIX) 75 MG tablet, Take 1 tablet (75 mg total) by mouth daily., Disp: 90 tablet, Rfl: 3 .  empagliflozin (JARDIANCE) 10 MG TABS tablet, Take 10 mg by mouth daily., Disp: 90 tablet, Rfl: 3 .  ezetimibe (ZETIA) 10 MG tablet, Take 1 tablet (10 mg total) by mouth daily., Disp: 90 tablet, Rfl: 3 .  famotidine (PEPCID) 40 MG tablet, Take 1 tablet (40 mg total) by mouth at bedtime., Disp: 90 tablet, Rfl: 0 .  fenofibrate 160 MG tablet, Take 1 tablet (160 mg total) by mouth daily., Disp: 60 tablet, Rfl: 2 .  furosemide (LASIX) 80 MG tablet, Take 80 mg by mouth 3 (three) times a week. Take 1 tab 3-4 times weekly, Disp: , Rfl:  .  glucose blood test strip, Use as instructed, Disp: 100 each, Rfl: 12 .  Insulin Glargine (LANTUS SOLOSTAR) 100 UNIT/ML Solostar Pen, Inject 72 Units into the skin 2 (two) times daily., Disp: 5 pen, Rfl: 11 .  Insulin Glargine (LANTUS) 100 UNIT/ML Solostar Pen, Inject 38 Units into the skin 3 (three) times daily., Disp: , Rfl:  .  Insulin Pen Needle (PEN NEEDLES) 31G X 6 MM MISC, Use as directed, Disp: 100 each, Rfl: 6 .  Insulin Syringes, Disposable, U-100 0.5 ML MISC, Use as directed, Disp: 100 each, Rfl: 11 .  levothyroxine (SYNTHROID) 200 MCG tablet, TAKE 1 TABLET  (200 MCG TOTAL) BY MOUTH DAILY. (DOSE INCREASE), Disp: 30 tablet, Rfl: 6 .  losartan (COZAAR) 25 MG tablet, Take 1 tablet (25 mg total) by mouth daily., Disp: 60 tablet, Rfl: 2 .  metFORMIN (GLUCOPHAGE-XR) 500 MG 24 hr tablet, Take 1 tablet (500 mg total) by mouth 2 (two) times daily., Disp: 60 tablet, Rfl: 11 .  metoprolol tartrate (LOPRESSOR) 50 MG tablet, Take 1 tablet (50 mg total) by mouth 2 (two) times daily., Disp: 180 tablet, Rfl: 3 .  nitroGLYCERIN (NITROSTAT) 0.4 MG SL tablet, Place 1 tablet (0.4 mg total) under the tongue every 5 (five) minutes x 3 doses as needed for chest pain., Disp: 25 tablet, Rfl: 0 .  pantoprazole (PROTONIX) 40 MG tablet, Take 1 tablet (40 mg total) by mouth daily., Disp: 90 tablet, Rfl: 3 .  PRESCRIPTION MEDICATION, CPAP- At bedtime, Disp: , Rfl:  .  rosuvastatin (CRESTOR) 40 MG tablet, Take 1 tablet (40 mg total) by mouth daily., Disp: 30 tablet, Rfl: 11 .  sildenafil (VIAGRA) 50 MG tablet, Take 50 mg by mouth 2 (two) times a week., Disp: , Rfl:  .  ticagrelor (BRILINTA) 90 MG TABS tablet, Take 1 tablet (90 mg total) by mouth 2 (two) times daily., Disp: 120 tablet, Rfl: 3 .  TRUEplus Lancets 28G MISC, Use as directed, Disp: 100 each, Rfl: 12

## 2019-05-22 NOTE — BH Specialist Note (Signed)
Integrated Behavioral Health Initial Visit  MRN: [id number] Name: Marc Walker  Number of [place] Clinician visits:: 1/6 Session Start time: 2:00pm  Session End time: 2:30pm Total time: 30  Type of Service: [place] Interpretor:No. Interpretor Name and Language: N/A   SUBJECTIVE: Marc Walker is a 48 y.o. male accompanied by self Patient was referred by Financial Counselor for Legal Aid referral for disability claim. Patient reports the following symptoms/concerns: Limited income, multiple disability denials Duration of problem: ongoing; Severity of problem: moderate  OBJECTIVE: Mood: Depressed and Affect: Appropriate and Tearful Risk of harm to self or others: No plan to harm self or others  LIFE CONTEXT: Family and Social: Pt lives with mother and father for 8 years. Pt reports he has a girlfriend. 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 Pt receives food stamps. Self-Care: Pt had difficulty recalling activities that bring him enjoyment. Pt reports he is religious. Pt has filed his disability with a Chief Executive Officer. Pt is uninsured.  Life Changes: Pt had a heart attack last month. He reports increased stress about affording medications. Pt reports he has a high balance with pharmacy at [facility] and has missing documents per his application for financial assistance today.  STRENGTHS: Pt reports he quit smoking cigarettes 2 1/2 years ago Pt has safe and stable housing Pt has support from girlfriend  GOALS ADDRESSED: Patient will: 1. Reduce symptoms of: depression and stress 2. Increase knowledge and/or ability of: coping skills  3. Demonstrate ability to: Increase adequate support systems for patient/family  INTERVENTIONS: Interventions utilized: Supportive Counseling and Link to [employer]  Standardized Assessments completed: Not Needed  ASSESSMENT: Patient currently experiencing  feelings of sadness due to compounding financial stress. Pt has applied for disability with repeated denials for approximately 2 years. Pt hired a Chiropractor to re-apply 2 1/2 months ago. Pt is unable to afford his medications. Pt reports a staff member at the [facility] is helping him obtain some medications through financial programs.   MSW Intern explained Legal Aid process for disability and encouraged pt to contact this MSW Intern if he chooses to utilize Legal Aid services in the future.   Patient may benefit from dispensary of hope application via [facility] pharmacy, [place], and f/u about Medicaid application status during recent hospital stay.   PLAN: 1. Follow up with behavioral health clinician on : MSW Intern encouraged pt to contact if additional resource needs arise.  2. Behavioral recommendations: MSW Intern encouraged pt to complete financial assistance application documentation and inquire about Medicaid Status with hospital. MSW Intern encouraged pt to use healthy coping mechanisms discussed in session. 3. Referral(s): [facility] (LME/Outside Clinic) 4. "From scale of 1-10, how likely are you to follow plan?":   Marc Walker MSW Intern  05/22/2019  3:47pm

## 2019-05-23 MED ORDER — METFORMIN HCL ER 500 MG PO TB24: 500.0000 mg | ORAL_TABLET | Freq: Two times a day (BID) | ORAL | 2 refills | Status: DC

## 2019-05-23 NOTE — Telephone Encounter (Signed)
Called patient and reviewed meds. -has several concerns here-  He has Plavix and Brilinta on med list. Had been on Plavix prior to recent stenting, switched to Tibbie after NSTEMI on 03/27/19 but is cost prohibitive. After his 30 days free, he was switched back to Plavix per ov 04/14/19.  Pt somehow received another 30 days (free) and wanted to go back to Brilinta for 1 month because "$500 medicine has got to be better than a $4 medicine".  I told him to continue Plavix until we review with Dr. Harrington Challenger.  Ran out of aspirin but today took one of his parents' aspirin and will get more today.  Unclear how long been without aspirin.    Not taking any lasix because he is out everyday working for [employer] and Sundays is church and the lasix causes too frequent urination and it interferes.  Other than aspirin, pt is not out of any of his medications.  Further refills will need to be sent to [facility].

## 2019-05-23 NOTE — Telephone Encounter (Signed)
Pt should be on 81 mg asa I would stay on Plavix for now   I would not switch back and forth between brilinta and asa If not taking lasix needs to be very careful and limit salt intake so that he doesn't get swollen.   2.5 grams NA per day

## 2019-05-24 ENCOUNTER — Telehealth: Payer: Self-pay | Admitting: Internal Medicine

## 2019-05-24 ENCOUNTER — Other Ambulatory Visit: Payer: Self-pay

## 2019-05-24 NOTE — Telephone Encounter (Signed)
New message    [facility] requesting referral Please fax attn: Fredrich Birks [phone] Phone [phone]

## 2019-05-29 MED ORDER — CLOPIDOGREL BISULFATE 75 MG PO TABS: 75.0000 mg | ORAL_TABLET | Freq: Every day | ORAL | 0 refills | Status: DC

## 2019-05-29 NOTE — Telephone Encounter (Signed)
Spoke with patient.   He states he ran out of plavix.  Took last dose yesterday am and so today he took one Brilinta. I made him aware of the year's supply that was sent to [facility] after last ov with Buren Kos. He was not aware of this.  Reviewed with Ian Bushman. PharmD. She recommends patient take Plavix 75 mg tonight and then take it every evening going forward.  Pt verbalizes understanding and will take no further Brilinta.  Will continue daily asa. Reviewed reducing sodium since he is unwilling to take Lasix most days.  He asks if there are sodium free hot dogs because he loves them.  I instructed on reading food labels and keep daily intake of sodium between 2 - 2.5 mg. Also adv to take lasix.   Pt has appt with Lipid clinic on 12/2.  Adv to ask for aspirin samples at that time.

## 2019-05-29 NOTE — Telephone Encounter (Signed)
Spoke with Jasmine at cardiac rehab.  She has received the signed cardiac rehab referral order from Dr. Harrington Challenger.

## 2019-05-31 ENCOUNTER — Ambulatory Visit (INDEPENDENT_AMBULATORY_CARE_PROVIDER_SITE_OTHER): Payer: Self-pay | Admitting: Pharmacist

## 2019-05-31 ENCOUNTER — Other Ambulatory Visit: Payer: Self-pay

## 2019-05-31 ENCOUNTER — Ambulatory Visit: Payer: Self-pay | Admitting: Pharmacist

## 2019-05-31 DIAGNOSIS — I25118 Atherosclerotic heart disease of native coronary artery with other forms of angina pectoris: Secondary | ICD-10-CM

## 2019-05-31 DIAGNOSIS — E782 Mixed hyperlipidemia: Secondary | ICD-10-CM

## 2019-05-31 MED ORDER — EZETIMIBE 10 MG PO TABS: 10.0000 mg | ORAL_TABLET | Freq: Every day | ORAL | 11 refills | Status: DC

## 2019-05-31 MED FILL — EZETIMIBE 10 MG TAB: 10 | 30 days supply | Qty: 30 | Fill #0

## 2019-05-31 NOTE — Patient Instructions (Addendum)
Your LDL is 139 and your goal is less than 70  Your triglycerides are 712 and your goal is less than 150  Pick up ezetimibe and start taking 1 tablet daily. Continue taking rosuvastatin 40mg  daily and fenofibrate 160mg  daily  Try to cut back on portions, fried food and fast food  Call the Medicaid office to check in on the status of your application  Check with your primary care doctor about Victoza to help lower your blood sugar and help with weight loss  Recheck cholesterol in 2 months on Tuesday February 2nd - fasting labs any time after 7:30am

## 2019-05-31 NOTE — Progress Notes (Signed)
Patient ID: Marc Walker                 DOB: 17-Nov-1970                    MRN: [id number]     HPI: [name] is a 48 y.o. male patient referred to lipid clinic by Dr Harrington Challenger. PMH is significant for CAD s/p multiple MIs, multiple prior stents placed at outside institutions, DM, HTN, HLD and hypertriglyceridemia, former tobacco abuse, OSA, diastolic HF, and obesity. He was admitted to [facility] in 3/17 with unstable angina. LHC demonstrated 80% mid RCA stenosis treated with DES, 90% proximal LAD and 70% mid LAD stenosis involving the previous stent treated with DES, 20% RPDA in-stent restenosis treated with angioplasty. Residual disease included 90% D2 stenosis and 70% mid LCx stenosis, treated medically.He presented to [facility] on 03/25/19 with NSTEMI. Cath showed patent prior stents with new mid RCA 95% stenosis treated with PCI/DES. He was enrolled in ithe AEGIS-II trial investigating Apolipoprotein A-1 vs. Placebo. Most recent lipid panel very uncontrolled secondary to medication noncompliance - cost is an issue as pt does not have medication insurance. When pt has previously been compliant to his statin, ezetimibe, and fibrate, his TG and LDL have both been at goal.  [place] who states pt can get rosuvastatin and ezetimibe for free through their [facility] program. Fenofibrate is $10/month. Pt picked up rosuvastatin and fenofibrate on 11/23 for a 1 month supply but has not been on ezetimibe for a while.  Main issue is medication access - states he met with financial dept at [facility] but that he didn't have all of the financial proof he needed. Inquired about Medicaid and pt states someone applied for him when he was admitted most recently with an MI but he has not followed up.  Lifestyle habits are poor - exercise is limited and diet is bad. Eats almost all meals out - yesterday ate a footlong meatball sub for breakfast,  another one for lunch, and had fried [place], pancakes and eggs for dinner from [employer]. Reports he has been kicked out of buffets for eating too much. States he know what to eat but doesn't know how to get there. Discussed working on portion control first which pt does not think is achievable because when he leaves a few bites of food, he then goes back 2 hours later and eats twice as much as before. States his $4 Plavix is too expensive.   Current Medications: rosuvastatin 66m daily, ezetimibe 166mdaily, fenofibrate 16057maily Intolerances:  Risk Factors: CAD s/p multiple MI and multiple stents, DM, former tobacco abuse, HF, obesity LDL goal: <16m73m, TG goal: < 150mg17m Diet: States he doesn't add sugar or salt to anything. Yesterday: ate a footlong meatball sub with pepperoni yesterday for breakfast and another one for lunch. Had deep fried turkeKuwaitcakes with sugar-free syrump, 2 eggs, hash browns for dinner from [employer].   Exercise: Limited. [facility] History: Pt was adopted. DM in his maternal grandmother.  Social History: Former smoker - quit 2 years ago.  Labs: 05/15/19: TC 312, TG 712, HDL 36, LDL 139 - noncompliance with lipid lowering therapy  8//24/20: A1c 12.4, noncompliance with DM therapy 05/23/18: TC 96, TG 132, HDL 42, LDL 28 (when compliant with rosuvastatin 40mg 69my, ezetimibe 10mg d65m, and fenofibrate 160mg da16m  Past Medical History:  Diagnosis Date  . Chronic diastolic CHF (congestive heart failure) ([place]) 03/09/2017   Echo 1/18: Mild LVH, EF 60-65, Gr 2 DD, mild LAE; difficult study-no obvious wall motion abnormalities with Definity  . CKD (chronic kidney disease)   . Coronary artery disease    a. s/pprior myocardial infarction, s/p multipleprior stents placedat outside institutions with last intervention 08/2015 at [facility].  . Diabetes mellitus ([place])   . Drug addiction in remission ([place])   .  Erectile dysfunction 03/02/2017  . Hyperlipidemia associated with type 2 diabetes mellitus ([place])   . Hypertension   . Hypothyroidism    a. adm with profound hypothyroidism near [facility] in 04/2017.  . MI (myocardial infarction) ([place]) 07/2009   [facility] in [place], [place] for last 2, 1st one in [place], [place]; total of 6 stents, last one 02/2014   . Morbid obesity ([place]) 07/06/2016  . Noncompliance with therapeutic plan    a. thyroid med -> profound hypothyroidism 04/2017.  [place] Kitchen OSA (obstructive sleep apnea) 07/06/2016   Moderate with AHI 21.8/hr by PSG 08/2012 now on CPAP  . Prolonged QT interval 05/14/2017  . Tobacco abuse   . Wide-complex tachycardia ([place]) 05/14/2017    Current Outpatient Medications on File Prior to Visit  Medication Sig Dispense Refill  . aspirin 81 MG tablet Take 1 tablet (81 mg total) by mouth daily. 90 tablet 3  . clopidogrel (PLAVIX) 75 MG tablet Take 1 tablet (75 mg total) by mouth daily. 90 tablet 0  . empagliflozin (JARDIANCE) 10 MG TABS tablet Take 10 mg by mouth daily. 90 tablet 3  . ezetimibe (ZETIA) 10 MG tablet Take 1 tablet (10 mg total) by mouth daily. (Patient not taking: Reported on 05/23/2019) 90 tablet 3  . famotidine (PEPCID) 40 MG tablet Take 1 tablet (40 mg total) by mouth at bedtime. (Patient not taking: Reported on 05/23/2019) 90 tablet 0  . fenofibrate 160 MG tablet Take 1 tablet (160 mg total) by mouth daily. 60 tablet 2  . furosemide (LASIX) 80 MG tablet Take 80 mg by mouth 3 (three) times a week. Take 1 tab 3-4 times weekly    . glucose blood test strip Use as instructed 100 each 12  . Insulin Glargine (LANTUS SOLOSTAR) 100 UNIT/ML Solostar Pen Inject 72 Units into the skin 2 (two) times daily. 5 pen 11  . Insulin Glargine (LANTUS) 100 UNIT/ML Solostar Pen Inject 38 Units into the skin 3 (three) times daily.    . Insulin Pen Needle (PEN NEEDLES) 31G X 6 MM MISC Use as directed 100 each 6  . Insulin Syringes, Disposable, U-100 0.5 ML MISC Use  as directed 100 each 11  . levothyroxine (SYNTHROID) 200 MCG tablet TAKE 1 TABLET (200 MCG TOTAL) BY MOUTH DAILY. (DOSE INCREASE) 30 tablet 6  . losartan (COZAAR) 25 MG tablet Take 1 tablet (25 mg total) by mouth daily. 60 tablet 2  . metFORMIN (GLUCOPHAGE-XR) 500 MG 24 hr tablet Take 1 tablet (500 mg total) by mouth 2 (two) times daily. 60 tablet 2  . metoprolol tartrate (LOPRESSOR) 50 MG tablet Take 1 tablet (50 mg total) by mouth 2 (two) times daily. 180 tablet 3  . nitroGLYCERIN (NITROSTAT) 0.4 MG SL tablet Place 1 tablet (0.4 mg total) under the tongue every 5 (five) minutes x 3 doses as needed for chest pain. 25 tablet 0  . pantoprazole (PROTONIX) 40 MG tablet Take 1 tablet (40 mg total) by mouth daily. 90 tablet 3  .  PRESCRIPTION MEDICATION CPAP- At bedtime    . rosuvastatin (CRESTOR) 40 MG tablet Take 1 tablet (40 mg total) by mouth daily. 30 tablet 11  . sildenafil (VIAGRA) 50 MG tablet Take 50 mg by mouth 2 (two) times a week.    . TRUEplus Lancets 28G MISC Use as directed 100 each 12   No current facility-administered medications on file prior to visit.     Allergies  Allergen Reactions  . 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  Pt only wants pain medicine if absolutely necessary.  . Levofloxacin Rash    Rash on back (not sun exposed)and hypersensitivity to skin on exposed skin/arm  . Nsaids Other (See Comments)    Avoid due to CKD    Assessment/Plan:  1. Hyperlipidemia - Lipids elevated secondary to medication nonadherence, dietary indiscretion, and limited exercise. Discussed the importance of his lipid lowering medications given progressive ASCVD. Pt will take his rosuvastatin 33m daily, ezetimibe 136mdaily ([facility]), and fenofibrate 1605maily. Encouraged pt to call Medicaid office to follow up on status of his application as this would help with all medication copays. Rechecking lipids  in 2 months.  2. Lifestyle - Exercise is limited and diet is very poor - pt is eating large portions and at restaurants almost 100% of the time. Discussed working on portion control and making healthier choices but pt does not think this is achievable. Encouraged pt to follow up with PCP for DM management and to discuss Victoza for DM control, to help curb appetite and help with weight loss; not sure this is covered under their free medication program.    Clearnce Leja E. Nannie Starzyk, PharmD, BCACP, CPPLake of the [id number]reHaleC [id number]: [phone]: [phone]/07/2018 12:13 PM

## 2019-06-06 ENCOUNTER — Ambulatory Visit (INDEPENDENT_AMBULATORY_CARE_PROVIDER_SITE_OTHER): Payer: Self-pay | Admitting: Internal Medicine

## 2019-06-06 ENCOUNTER — Other Ambulatory Visit: Payer: Self-pay

## 2019-06-06 ENCOUNTER — Encounter: Payer: Self-pay | Admitting: Internal Medicine

## 2019-06-06 VITALS — BP 128/80 | HR 72 | Temp 98.7°F | Ht 65.5 in | Wt 306.2 lb

## 2019-06-06 DIAGNOSIS — G4733 Obstructive sleep apnea (adult) (pediatric): Secondary | ICD-10-CM

## 2019-06-06 DIAGNOSIS — Z9989 Dependence on other enabling machines and devices: Secondary | ICD-10-CM

## 2019-06-06 NOTE — Assessment & Plan Note (Signed)
He seems pleasantly accepting with no progress at meaningful weight loss.

## 2019-06-06 NOTE — Assessment & Plan Note (Signed)
I think our best hope is to get him an autoset machine with full pressure range and a download card. Hopefully [facility] can help with this as he indicates. Plan- replace old CPAP machine- auto 5-20

## 2019-06-06 NOTE — Patient Instructions (Signed)
[facility]- CPAP autoset machine, 5-20 cwp, mask of choice, hoses/ humidifier/ supplies, Download card   Dx OSA   Please call if we can help

## 2019-06-06 NOTE — Progress Notes (Signed)
HPI M former smoker followed for OSA, complicated by chronic diastolic CHF, CAD status post multiple PCI's, GERD, hypothyroid, DM 2 with peripheral neuropathy, morbid obesity NPSG 09/15/12- [facility]- AHI 21.8/ hr, desaturation to 62%, CPAP titrated to 13 Walk test on room air 12/01/2018-  Min sat 93%, Max HR 94/ min.   -----------------------------------------------------------------------------------------------  12/01/2018- Marc Walker former smoker followed for OSA, complicated by chronic diastolic CHF, CAD status post multiple PCI's, GERD, hypothyroid, DM 2 with peripheral neuropathy, morbid obesity Quit smoking 21 months ago- hoped his breathing would get better.  CPAP 2- old machine Says he can't afford DME or replacement machine/ BIPAP. Does a little on-line shopping. Discussed on-line access to supplies, etc.  -----OSA on CPAP; DME: [facility]; uses CPAP nightly, no issues w/ pressure settings or mask Denied for Disability. Has attorney. Arrival sat on room air 97%. Walk test on room air-  Min sat 93%, Max HR 94/ min.    06/06/2019- 22 Walker former smoker followed for OSA, complicated by chronic diastolic CHF, CAD status post multiple PCI's, GERD, hypothyroid, DM 2 with peripheral neuropathy, morbid obesity Hosp for NSTEMI in Sept.  CPAP  18 at last visit was unable to afford to to replace old machine.  Body weight today 306 lbs Had flu vax He reports using CPAP every night and sleeping well with it.  Old machine is not capable of being downloaded.  He says [facility] can get him a replacement machine with a written prescription. Discussed prescribing an autoset machine 5-20 with card. CXR- 03/25/2019-  Cardiomegaly without acute abnormality of the lungs.   ROS-see HPI   + = positive Constitutional:    weight loss, night sweats, fevers, chills, +fatigue, lassitude. HEENT:    headaches, difficulty swallowing, tooth/dental problems, sore throat,       sneezing, itching, ear ache, nasal  congestion, post nasal drip, snoring CV:    chest pain, orthopnea, PND, swelling in lower extremities, anasarca,                                  dizziness, palpitations Resp:   +shortness of breath with exertion or at rest.                productive cough,   non-productive cough, coughing up of blood.              change in color of mucus.  wheezing.   Skin:    rash or lesions. GI:  No-   heartburn, indigestion, abdominal pain, nausea, vomiting, diarrhea,                 change in bowel habits, loss of appetite GU: dysuria, change in color of urine, no urgency or frequency.   flank pain. MS:   joint pain, stiffness, decreased range of motion, back pain. Neuro-     nothing unusual Psych:  change in mood or affect.  depression or anxiety.   memory loss.  OBJ- Physical Exam General- Alert, Oriented, Affect-appropriate, Distress- none acute,  + morbidly obese Skin- rash-none, lesions- none, excoriation- none Lymphadenopathy- none Head- atraumatic            Eyes- Gross vision intact, PERRLA, conjunctivae and secretions clear            Ears- Hearing, canals-normal            Nose- Clear, no-Septal dev, mucus, polyps, erosion, perforation  Throat- Mallampati IV , mucosa clear , drainage- none, tonsils- atrophic Neck- flexible , trachea midline, no stridor , thyroid nl, carotid no bruit Chest - symmetrical excursion , unlabored           Heart/CV- RRR , no murmur , no gallop  , no rub, nl s1 s2                           - JVD- none , edema+ 1, stasis changes- none, varices- none           Lung- clear to P&A, wheeze- none, cough- none , dullness-none, rub- none           Chest wall-  Abd-  Br/ Gen/ Rectal- Not done, not indicated Extrem- cyanosis- none, clubbing, none, atrophy- none, strength- nl Neuro- grossly intact to observation

## 2019-06-14 ENCOUNTER — Encounter: Payer: Self-pay | Admitting: Internal Medicine

## 2019-06-14 DIAGNOSIS — Z20822 Contact with and (suspected) exposure to covid-19: Secondary | ICD-10-CM

## 2019-06-16 ENCOUNTER — Ambulatory Visit: Payer: Self-pay | Attending: Internal Medicine

## 2019-06-16 DIAGNOSIS — Z20822 Contact with and (suspected) exposure to covid-19: Secondary | ICD-10-CM

## 2019-06-17 LAB — NOVEL CORONAVIRUS, NAA: SARS-CoV-2, NAA: DETECTED — AB

## 2019-06-18 ENCOUNTER — Encounter: Payer: Self-pay | Admitting: Internal Medicine

## 2019-06-18 ENCOUNTER — Telehealth: Payer: Self-pay | Admitting: Infectious Diseases

## 2019-06-18 ENCOUNTER — Other Ambulatory Visit: Payer: Self-pay | Admitting: Infectious Diseases

## 2019-06-18 ENCOUNTER — Telehealth: Payer: Self-pay | Admitting: Internal Medicine

## 2019-06-18 DIAGNOSIS — E1142 Type 2 diabetes mellitus with diabetic polyneuropathy: Secondary | ICD-10-CM

## 2019-06-18 DIAGNOSIS — U071 COVID-19: Secondary | ICD-10-CM | POA: Insufficient documentation

## 2019-06-18 MED ORDER — BENZONATATE 100 MG PO CAPS: 100.0000 mg | ORAL_CAPSULE | Freq: Three times a day (TID) | ORAL | 0 refills | Status: DC | PRN

## 2019-06-18 MED ORDER — ALBUTEROL SULFATE HFA 108 (90 BASE) MCG/ACT IN AERS: 2.0000 | INHALATION_SPRAY | Freq: Four times a day (QID) | RESPIRATORY_TRACT | 0 refills | Status: DC | PRN

## 2019-06-18 NOTE — Telephone Encounter (Signed)
Phone call placed to patient this morning to inform him that his Covid test that he did 2 days ago came back positive.  Patient lives with his parents and his mother had tested positive for Covid. In terms of symptoms, patient reports that he has baseline cough and a little shortness of breath with exertion but no worsening of the symptoms since being exposed and testing positive for Covid.  No fever.  Only symptom that he has at this time is some loss of taste and smell.  Patient wanted to know if there is any symptomatic treatment.  Advised that he is not very symptomatic at this time however we do recommend albuterol inhaler and Tessalon Perles to use as needed for any cough or shortness of breath.  Patient would like to have these medicines on hand to use if needed. Patient given instructions to quarantine for at least 10 to 14 days and until any symptoms of shortness of breath and cough resolve.  Advised to avoid going out in public during this time but if he has to he should wear mask.  Should avoid having anyone come over to the house unless absolutely necessary.  Advised that if there is any increased shortness of breath, cough and/or fever he should be seen in the emergency room. Patient is not very symptomatic but he does have comorbid conditions.  Message sent to Dr. Joya Gaskins to inquire whether he would be a candidate to receive monoclonal antibody infusion. Message will also be sent to our clinical pharmacist to let him know that patient needs the albuterol inhaler and Tessalon Perles mailed to him. Patient was sent self-isolation  CDC guidelines.

## 2019-06-18 NOTE — Telephone Encounter (Signed)
I connected by phone with Marc Walker on 06/18/2019 at 1:32 PM to discuss the potential use of an new treatment for mild to moderate COVID-19 viral infection in non-hospitalized patients  Lost sense of smell and taste is altered since 12/16. His mother whom he lives with also tested positive but symptom onset > 10 days ago and he reports she is up and about and improving. He does have shortness of breath but no more than normal/usual.   This patient is a 48 y.o. male that meets the FDA criteria for Emergency Use Authorization of bamlanivimab or casirivimab\imdevimab.  Has a (+) direct SARS-CoV-2 viral test result  Has mild or moderate COVID-19   Is ? 50 years of age and weighs ? 40 kg  Is NOT hospitalized due to COVID-19  Is NOT requiring oxygen therapy or requiring an increase in baseline oxygen flow rate due to COVID-19  Is within 10 days of symptom onset  Has at least one of the high risk factor(s) for progression to severe COVID-19 and/or hospitalization as defined in EUA.  Specific high risk criteria : BMI >/= 35   I have spoken and communicated the following to the patient or parent/caregiver:  1. FDA has authorized the emergency use of bamlanivimab and casirivimab\imdevimab for the treatment of mild to moderate COVID-19 in adults and pediatric patients with positive results of direct SARS-CoV-2 viral testing who are 14 years of age and older weighing at least 40 kg, and who are at high risk for progressing to severe COVID-19 and/or hospitalization.  2. The significant known and potential risks and benefits of bamlanivimab and casirivimab\imdevimab, and the extent to which such potential risks and benefits are unknown.  3. Information on available alternative treatments and the risks and benefits of those alternatives, including clinical trials.  4. Patients treated with bamlanivimab and casirivimab\imdevimab should continue to self-isolate and use infection control measures  (e.g., wear mask, isolate, social distance, avoid sharing personal items, clean and disinfect "high touch" surfaces, and frequent handwashing) according to CDC guidelines.   5. The patient or parent/caregiver has the option to accept or refuse bamlanivimab or casirivimab\imdevimab .  After reviewing this information with the patient, The patient agreed to proceed with receiving the bamlanimivab infusion and will be provided a copy of the Fact sheet prior to receiving the infusion.   Janene Madeira 06/18/2019 1:32 PM

## 2019-06-19 MED FILL — BENZONATATE 100 MG CAPS: 100 | 10 days supply | Qty: 30 | Fill #0

## 2019-06-19 MED FILL — ?FUROSEMIDE 80MG TABS: 80 | 90 days supply | Qty: 180 | Fill #0

## 2019-06-19 MED FILL — ALBUTEROL SULFATE HFA 108 (: 108 (90 BAS | 25 days supply | Qty: 18 | Fill #0

## 2019-06-19 MED FILL — CLOPIDOGREL 75 MG TABLET: 75 | 30 days supply | Qty: 30 | Fill #3

## 2019-06-19 MED FILL — ?METOPROLOL 50 MG TABLET: 50 | 30 days supply | Qty: 60 | Fill #4

## 2019-06-19 MED FILL — ?ROSUVASTATIN CALCIUM 40MG: 40 | 30 days supply | Qty: 30 | Fill #4

## 2019-06-19 MED FILL — ?LEVOTHYROXINE 200 MCG TAB: 200 MCG | 30 days supply | Qty: 30 | Fill #4

## 2019-06-19 MED FILL — LOSARTAN POTASSIUM 25 MG TA: 25 | 30 days supply | Qty: 30 | Fill #1

## 2019-06-19 MED FILL — PANTOPRAZOLE SOD DR 40 MG T: 40 | 30 days supply | Qty: 30 | Fill #3

## 2019-06-20 ENCOUNTER — Ambulatory Visit (HOSPITAL_COMMUNITY)
Admission: RE | Admit: 2019-06-20 | Discharge: 2019-06-20 | Disposition: A | Discharge status: Home / Self Care | Payer: HRSA Program | Source: Ambulatory Visit | Attending: Pulmonary Disease | Admitting: Pulmonary Disease

## 2019-06-20 DIAGNOSIS — E1142 Type 2 diabetes mellitus with diabetic polyneuropathy: Secondary | ICD-10-CM

## 2019-06-20 DIAGNOSIS — U071 COVID-19: Secondary | ICD-10-CM | POA: Diagnosis not present

## 2019-06-20 MED ORDER — METHYLPREDNISOLONE SODIUM SUCC 125 MG IJ SOLR: 125.0000 mg | Freq: Once | INTRAMUSCULAR | Status: DC | PRN

## 2019-06-20 MED ORDER — SODIUM CHLORIDE 0.9 % IV SOLN
INTRAVENOUS | Status: DC | PRN
  Administered 2019-06-20: 11:00:00 250 mL via INTRAVENOUS

## 2019-06-20 MED ORDER — DIPHENHYDRAMINE HCL 50 MG/ML IJ SOLN: 50.0000 mg | Freq: Once | INTRAMUSCULAR | Status: DC | PRN

## 2019-06-20 MED ORDER — ALBUTEROL SULFATE HFA 108 (90 BASE) MCG/ACT IN AERS: 2.0000 | INHALATION_SPRAY | Freq: Once | RESPIRATORY_TRACT | Status: DC | PRN

## 2019-06-20 MED ORDER — FAMOTIDINE IN NACL 20-0.9 MG/50ML-% IV SOLN: 20.0000 mg | Freq: Once | INTRAVENOUS | Status: DC | PRN

## 2019-06-20 MED ORDER — EPINEPHRINE 0.3 MG/0.3ML IJ SOAJ: 0.3000 mg | Freq: Once | INTRAMUSCULAR | Status: DC | PRN

## 2019-06-20 MED ORDER — SODIUM CHLORIDE 0.9 % IV SOLN
Freq: Once | INTRAVENOUS | Status: AC
  Filled 2019-06-20: qty 10

## 2019-06-20 NOTE — Discharge Instructions (Signed)
COVID-19: How to Protect Yourself and Others Know how it spreads  There is currently no vaccine to prevent coronavirus disease 2019 (COVID-19).  The best way to prevent illness is to avoid being exposed to this virus.  The virus is thought to spread mainly from person-to-person. ? Between people who are in close contact with one another (within about 6 feet). ? Through respiratory droplets produced when an infected person coughs, sneezes or talks. ? These droplets can land in the mouths or noses of people who are nearby or possibly be inhaled into the lungs. ? Some recent studies have suggested that COVID-19 may be spread by people who are not showing symptoms. Everyone should Clean your hands often  Wash your hands often with soap and water for at least 20 seconds especially after you have been in a public place, or after blowing your nose, coughing, or sneezing.  If soap and water are not readily available, use a hand sanitizer that contains at least 60% alcohol. Cover all surfaces of your hands and rub them together until they feel dry.  Avoid touching your eyes, nose, and mouth with unwashed hands. Avoid close contact  Stay home if you are sick.  Avoid close contact with people who are sick.  Put distance between yourself and other people. ? Remember that some people without symptoms may be able to spread virus. ? This is especially important for people who are at higher risk of getting very sick.www.cdc.gov/coronavirus/2019-ncov/need-extra-precautions/people-at-higher-risk.html Cover your mouth and nose with a cloth face cover when around others  You could spread COVID-19 to others even if you do not feel sick.  Everyone should wear a cloth face cover when they have to go out in public, for example to the grocery store or to pick up other necessities. ? Cloth face coverings should not be placed on young children under age 2, anyone who has trouble breathing, or is unconscious,  incapacitated or otherwise unable to remove the mask without assistance.  The cloth face cover is meant to protect other people in case you are infected.  Do NOT use a facemask meant for a healthcare worker.  Continue to keep about 6 feet between yourself and others. The cloth face cover is not a substitute for social distancing. Cover coughs and sneezes  If you are in a private setting and do not have on your cloth face covering, remember to always cover your mouth and nose with a tissue when you cough or sneeze or use the inside of your elbow.  Throw used tissues in the trash.  Immediately wash your hands with soap and water for at least 20 seconds. If soap and water are not readily available, clean your hands with a hand sanitizer that contains at least 60% alcohol. Clean and disinfect  Clean AND disinfect frequently touched surfaces daily. This includes tables, doorknobs, light switches, countertops, handles, desks, phones, keyboards, toilets, faucets, and sinks. www.cdc.gov/coronavirus/2019-ncov/prevent-getting-sick/disinfecting-your-home.html  If surfaces are dirty, clean them: Use detergent or soap and water prior to disinfection.  Then, use a household disinfectant. You can see a list of EPA-registered household disinfectants here. cdc.gov/coronavirus 11/01/2018 This information is not intended to replace advice given to you by your health care provider. Make sure you discuss any questions you have with your health care provider. Document Released: 10/11/2018 Document Revised: 11/09/2018 Document Reviewed: 10/11/2018 Elsevier Patient Education  2020 Elsevier Inc.  

## 2019-06-20 NOTE — Progress Notes (Signed)
Patient ID: Marc Walker, male   DOB: 10-23-1970, 48 y.o.   MRN: [id number]   Diagnosis: [id number]  Physician: Mannam  Procedure: [facility] Med: casirivimab\imdevimab infusion - Provided patient with casirivimab\imdevimab fact sheet for patients, parents and caregivers prior to infusion.  Complications: No immediate complications noted.  Discharge: Discharged home   Estrella Deeds 06/20/2019

## 2019-06-26 ENCOUNTER — Encounter (HOSPITAL_BASED_OUTPATIENT_CLINIC_OR_DEPARTMENT_OTHER): Payer: Self-pay | Admitting: *Deleted

## 2019-06-26 ENCOUNTER — Emergency Department (HOSPITAL_BASED_OUTPATIENT_CLINIC_OR_DEPARTMENT_OTHER): Payer: Self-pay

## 2019-06-26 ENCOUNTER — Other Ambulatory Visit: Payer: Self-pay

## 2019-06-26 ENCOUNTER — Emergency Department (HOSPITAL_BASED_OUTPATIENT_CLINIC_OR_DEPARTMENT_OTHER)
Admission: EM | Admit: 2019-06-26 | Discharge: 2019-06-26 | Disposition: A | Discharge status: Home / Self Care | Payer: Self-pay | Attending: Emergency Medicine | Admitting: Emergency Medicine

## 2019-06-26 DIAGNOSIS — Z7902 Long term (current) use of antithrombotics/antiplatelets: Secondary | ICD-10-CM | POA: Insufficient documentation

## 2019-06-26 DIAGNOSIS — Z794 Long term (current) use of insulin: Secondary | ICD-10-CM | POA: Insufficient documentation

## 2019-06-26 DIAGNOSIS — M545 Low back pain, unspecified: Secondary | ICD-10-CM

## 2019-06-26 DIAGNOSIS — E785 Hyperlipidemia, unspecified: Secondary | ICD-10-CM | POA: Insufficient documentation

## 2019-06-26 DIAGNOSIS — I130 Hypertensive heart and chronic kidney disease with heart failure and stage 1 through stage 4 chronic kidney disease, or unspecified chronic kidney disease: Secondary | ICD-10-CM | POA: Insufficient documentation

## 2019-06-26 DIAGNOSIS — I5032 Chronic diastolic (congestive) heart failure: Secondary | ICD-10-CM | POA: Insufficient documentation

## 2019-06-26 DIAGNOSIS — I252 Old myocardial infarction: Secondary | ICD-10-CM | POA: Insufficient documentation

## 2019-06-26 DIAGNOSIS — U071 COVID-19: Secondary | ICD-10-CM | POA: Insufficient documentation

## 2019-06-26 DIAGNOSIS — N189 Chronic kidney disease, unspecified: Secondary | ICD-10-CM | POA: Insufficient documentation

## 2019-06-26 DIAGNOSIS — Z7982 Long term (current) use of aspirin: Secondary | ICD-10-CM | POA: Insufficient documentation

## 2019-06-26 DIAGNOSIS — Z87891 Personal history of nicotine dependence: Secondary | ICD-10-CM | POA: Insufficient documentation

## 2019-06-26 DIAGNOSIS — E039 Hypothyroidism, unspecified: Secondary | ICD-10-CM | POA: Insufficient documentation

## 2019-06-26 DIAGNOSIS — E1122 Type 2 diabetes mellitus with diabetic chronic kidney disease: Secondary | ICD-10-CM | POA: Insufficient documentation

## 2019-06-26 DIAGNOSIS — Z79899 Other long term (current) drug therapy: Secondary | ICD-10-CM | POA: Insufficient documentation

## 2019-06-26 MED ORDER — KETOROLAC TROMETHAMINE 30 MG/ML IJ SOLN
30.0000 mg | Freq: Once | INTRAMUSCULAR | Status: AC
  Administered 2019-06-26: 30 mg via INTRAMUSCULAR
  Filled 2019-06-26: qty 1

## 2019-06-26 MED ORDER — LIDOCAINE 5 % EX PTCH: 1.0000 | MEDICATED_PATCH | CUTANEOUS | 0 refills | Status: DC

## 2019-06-26 MED ORDER — DICLOFENAC SODIUM 1 % EX GEL: 2.0000 g | Freq: Four times a day (QID) | CUTANEOUS | 0 refills | Status: DC

## 2019-06-26 MED ORDER — LIDOCAINE 5 % EX PTCH
1.0000 | MEDICATED_PATCH | CUTANEOUS | Status: DC
  Filled 2019-06-26: qty 1

## 2019-06-26 NOTE — ED Triage Notes (Signed)
He was Covid Positive 12/18. He is here today with c.o back pain.

## 2019-06-26 NOTE — Discharge Instructions (Addendum)
You may alternate taking Tylenol as needed for pain control. You may take [phone] mg of Tylenol every 6 hours. Do not exceed 4000 mg of Tylenol daily as this can lead to liver damage. You may use warm and cold compresses to help with your symptoms.   Use the diclofenac gel  as directed. You were also given a prescription for Lidoderm patches.  Make sure to clean off the diclofenac gel prior to using the patches.  If you are unable to fill the prescription for the patches then you may buy over-the-counter Salonpas patches.  Please follow up with your primary care provider within 5-7 days for re-evaluation of your symptoms.   Return to the emergency department immediately if you experience any back pain associated with fevers, loss of control of your bowels/bladder, weakness/numbness to your legs, numbness to your groin area, inability to walk, or inability to urinate.

## 2019-06-26 NOTE — ED Provider Notes (Signed)
[facility] EMERGENCY DEPARTMENT Provider Note   CSN: [id number] Arrival date & time: 06/26/19  1607     History Chief Complaint  Patient presents with  . Back Pain  . Covid Positive    Marc Walker is a 48 y.o. male.  HPI   48 y/o male with a history of CHF, CKD, diabetes, hyperlipidemia, hypertension, OSA, who presents emergency department today complaining of lower back pain that started last night.  Rates pain as severe in nature.  It is located to the middle of his lower back.  It does not radiate.  Pain is worse with certain movements and positions.  He has not tried any interventions for symptoms.  He has no numbness/weakness to the legs.  No tingling.  No loss control bowel or bladder function.  No urinary retention.  No saddle anesthesia, fevers or history of cancer.  He denies any recent heavy lifting or falls.  He denies any chest pain, shortness of breath, cough or pleuritic pain.  Denies any fevers.  Past Medical History:  Diagnosis Date  . Chronic diastolic CHF (congestive heart failure) (HCC) 03/09/2017   Echo 1/18: Mild LVH, EF 60-65, Gr 2 DD, mild LAE; difficult study-no obvious wall motion abnormalities with Definity  . CKD (chronic kidney disease)   . Coronary artery disease    a. s/pprior myocardial infarction, s/p multipleprior stents placedat outside institutions with [facility]  . Diabetes mellitus (HCC)   . Drug addiction in remission (HCC)   . Erectile dysfunction 03/02/2017  . Hyperlipidemia associated with type 2 diabetes mellitus (HCC)   . Hypertension   . Hypothyroidism    [facility].  . MI (myocardial infarction) (HCC) 07/2009   [facility] [place] for [place] 2, 1st one in [place]; total of 6 stents, last one 02/2014   . Morbid obesity (HCC) 07/06/2016  . Noncompliance with therapeutic plan    a. thyroid med -> profound hypothyroidism 04/2017.  Marland Kitchen.  OSA (obstructive sleep apnea) 07/06/2016   Moderate with AHI 21.8/hr by PSG 08/2012 now on CPAP  . Prolonged QT interval 05/14/2017  . Tobacco abuse   . Wide-complex tachycardia (HCC) 05/14/2017    Patient Active Problem List   Diagnosis Date Noted  . COVID-19 virus infection 06/18/2019  . ACS (acute coronary syndrome) (HCC) 03/25/2019  . NSTEMI (non-ST elevated myocardial infarction) (HCC) 03/25/2019  . Primary osteoarthritis of both hips 02/16/2019  . Dyspnea on exertion 12/04/2018  . CAD S/P multiple PCI's. patient reported 9 previous stents; 9/20 DES to RPAV-1 and DES to mid RCA -1; 3/17 DES to LAD and DES to mid RCA along with Angiosculpt balloon angioplas 08/09/2017  . Prolonged QT interval 05/14/2017  . Wide-complex tachycardia (HCC) 05/14/2017  . GERD (gastroesophageal reflux disease) 05/13/2017  . Hypothyroidism   . Bilateral carpal tunnel syndrome 04/08/2017  . Chronic diastolic CHF (congestive heart failure) (HCC) 03/09/2017  . Erectile dysfunction 03/02/2017  . OSA on CPAP 07/06/2016  . Morbid obesity (HCC) 07/06/2016  . DM type 2 with diabetic peripheral neuropathy (HCC) 12/16/2015  . CAD (coronary artery disease) 09/30/2015  . Essential hypertension 09/16/2015  . Hyperlipidemia     Past Surgical History:  Procedure Laterality Date  . APPENDECTOMY    . CARDIAC CATHETERIZATION N/A 09/16/2015   Procedure: Left Heart Cath and Coronary Angiography;  Surgeon: Runell GessJonathan J Berry, MD;  [facility];  Service: Cardiovascular;  Laterality: N/A;  . CARDIAC CATHETERIZATION N/A 09/16/2015   Procedure: Coronary Stent Intervention;  Surgeon: Runell Gess, MD;  Location: [facility] INVASIVE CV LAB;  Service: Cardiovascular;  Laterality: N/A;  . CORONARY STENT INTERVENTION N/A 03/27/2019   Procedure: CORONARY STENT INTERVENTION;  Surgeon: Corky Crafts, MD;  Location: [facility] INVASIVE CV LAB;  Service: Cardiovascular;  Laterality: N/A;  . CORONARY STENT PLACEMENT    . LEFT  HEART CATH AND CORONARY ANGIOGRAPHY N/A 03/27/2019   Procedure: LEFT HEART CATH AND CORONARY ANGIOGRAPHY;  Surgeon: Corky Crafts, MD;  Location: [facility] INVASIVE CV LAB;  Service: Cardiovascular;  Laterality: N/A;       Family History  Adopted: Yes  Problem Relation Age of Onset  . Diabetes Maternal Grandmother     Social History   Tobacco Use  . Smoking status: Former Smoker    Types: Cigarettes    Quit date: 01/31/2017    Years since quitting: 2.4  . Smokeless tobacco: Never Used  . Tobacco comment: quit 01/2017  Substance Use Topics  . Alcohol use: No    Alcohol/week: 0.0 standard drinks  . Drug use: No    Home Medications Prior to Admission medications   Medication Sig Start Date End Date Taking? Authorizing Provider  albuterol (VENTOLIN HFA) 108 (90 Base) MCG/ACT inhaler Inhale 2 puffs into the lungs every 6 (six) hours as needed for wheezing or shortness of breath. 06/18/19   Marcine Matar, MD  aspirin 81 MG tablet Take 1 tablet (81 mg total) by mouth daily. 11/17/16   Langeland, Kathaleen Grinder, MD  benzonatate (TESSALON PERLES) 100 MG capsule Take 1 capsule (100 mg total) by mouth 3 (three) times daily as needed for cough. 06/18/19   Marcine Matar, MD  clopidogrel (PLAVIX) 75 MG tablet Take 1 tablet (75 mg total) by mouth daily. 05/29/19   Pricilla Riffle, MD  diclofenac Sodium (VOLTAREN) 1 % GEL Apply 2 g topically 4 (four) times daily. 06/26/19   Audryana Hockenberry S, PA-C  empagliflozin (JARDIANCE) 10 MG TABS tablet Take 10 mg by mouth daily. 11/15/18   Marcine Matar, MD  ezetimibe (ZETIA) 10 MG tablet Take 1 tablet (10 mg total) by mouth daily. 05/31/19   Pricilla Riffle, MD  famotidine (PEPCID) 40 MG tablet Take 1 tablet (40 mg total) by mouth at bedtime. 10/21/18   [name] T, PA-C  fenofibrate 160 MG tablet Take 1 tablet (160 mg total) by mouth daily. 03/30/19   Georgie Chard D, NP  furosemide (LASIX) 80 MG tablet Take 80 mg by mouth 3 (three) times a week. Take 1  tab 3-4 times weekly    [provider]  glucose blood test strip Use as instructed 05/09/18   Marcine Matar, MD  Insulin Glargine (LANTUS SOLOSTAR) 100 UNIT/ML Solostar Pen Inject 72 Units into the skin 2 (two) times daily. 02/16/19   Marcine Matar, MD  Insulin Glargine (LANTUS) 100 UNIT/ML Solostar Pen Inject 38 Units into the skin 3 (three) times daily.    [provider]  Insulin Pen Needle (PEN NEEDLES) 31G X 6 MM MISC Use as directed 05/10/18   Marcine Matar, MD  Insulin Syringes, Disposable, U-100 0.5 ML MISC Use as directed 06/11/17   Marcine Matar, MD  levothyroxine (SYNTHROID) 200 MCG tablet TAKE 1 TABLET (200 MCG TOTAL) BY MOUTH DAILY. (DOSE INCREASE) 11/15/18   Marcine Matar, MD  lidocaine (LIDODERM) 5 % Place 1 patch onto the skin  daily. Remove & Discard patch within 12 hours or as directed by MD 06/26/19   Sarena Jezek S, PA-C  losartan (COZAAR) 25 MG tablet Take 1 tablet (25 mg total) by mouth daily. 03/29/19   Kathyrn Drown D, NP  metFORMIN (GLUCOPHAGE-XR) 500 MG 24 hr tablet Take 1 tablet (500 mg total) by mouth 2 (two) times daily. 05/23/19   Ladell Pier, MD  metoprolol tartrate (LOPRESSOR) 50 MG tablet Take 1 tablet (50 mg total) by mouth 2 (two) times daily. 12/08/18   Ladell Pier, MD  nitroGLYCERIN (NITROSTAT) 0.4 MG SL tablet Place 1 tablet (0.4 mg total) under the tongue every 5 (five) minutes x 3 doses as needed for chest pain. Patient not taking: Reported on 06/06/2019 03/29/19   Kathyrn Drown D, NP  pantoprazole (PROTONIX) 40 MG tablet Take 1 tablet (40 mg total) by mouth daily. 02/16/19   Ladell Pier, MD  PRESCRIPTION MEDICATION CPAP- At bedtime    [provider]  rosuvastatin (CRESTOR) 40 MG tablet Take 1 tablet (40 mg total) by mouth daily. 11/15/18 11/15/19  [name] T, PA-C  sildenafil (VIAGRA) 50 MG tablet Take 50 mg by mouth 2 (two) times a week.    [provider]  TRUEplus Lancets  28G MISC Use as directed 12/08/18   Ladell Pier, MD    Allergies    Other, Levofloxacin, and Nsaids  Review of Systems   Review of Systems  Constitutional: Negative for fever.  Respiratory: Negative for shortness of breath.   Cardiovascular: Negative for chest pain.  Gastrointestinal: Negative for abdominal pain, constipation, diarrhea and nausea.       No incontinence  Genitourinary: Negative for dysuria, hematuria and urgency.       No incontinence  Musculoskeletal: Positive for back pain.  Neurological: Negative for weakness and headaches.    Physical Exam Updated Vital Signs BP (!) 164/82   Pulse 78   Temp 98 F (36.7 C) (Oral)   Resp 20   Ht 5' 5.5" (1.664 m)   Wt (!) 138.9 kg   SpO2 95%   BMI 50.18 kg/m   Physical Exam Constitutional:      General: He is not in acute distress.    Appearance: He is well-developed.  Eyes:     Conjunctiva/sclera: Conjunctivae normal.  Cardiovascular:     Rate and Rhythm: Normal rate and regular rhythm.  Pulmonary:     Effort: Pulmonary effort is normal.     Breath sounds: Normal breath sounds.  Musculoskeletal:     Comments: TTP to the midline lumbar spine that reproduces his pain.   Skin:    General: Skin is warm and dry.  Neurological:     Mental Status: He is alert and oriented to person, place, and time.     Comments: 5/5 strength to BLE with flexion/extension/abduction/adduction at hips, flexion/extension at knees, and dorsiflexion/plantarflexion of feet. Sensation intact and symmetric bilaterally.      ED Results / Procedures / Treatments   Labs (all labs ordered are listed, but only abnormal results are displayed) Labs Reviewed - No data to display  EKG None  Radiology DG Lumbar Spine Complete  Result Date: 06/26/2019 CLINICAL DATA:  Chronic low back pain EXAM: LUMBAR SPINE - COMPLETE 4+ VIEW COMPARISON:  February 20, 2019 FINDINGS: Frontal, lateral, spot lumbosacral lateral, and bilateral oblique views  were obtained. There are 5 non rib-bearing lumbar type vertebral bodies. There is no fracture or spondylolisthesis. There is moderate disc  space narrowing at L1-2, L4-5, and L5-S1 with slight disc space narrowing at L3-4. There is no appreciable facet arthropathy. There is aortic atherosclerosis. IMPRESSION: Disc space narrowing at multiple levels with overall progression of narrowing cyst the previous study, particularly at L4-5. No appreciable facet arthropathy. No fracture or spondylolisthesis. Aortic Atherosclerosis (ICD10-I70.0). Electronically Signed   By: Bretta Bang III M.D.   On: 06/26/2019 18:30    Procedures Procedures (including critical care time)  Medications Ordered in ED Medications  lidocaine (LIDODERM) 5 % 1 patch (1 patch Transdermal Not Given 06/26/19 1825)  ketorolac (TORADOL) 30 MG/ML injection 30 mg (30 mg Intramuscular Given 06/26/19 1823)    ED Course  I have reviewed the triage vital signs and the nursing notes.  Pertinent labs & imaging results that were available during my care of the patient were reviewed by me and considered in my medical decision making (see chart for details).    MDM Rules/Calculators/A&P                      Patient with back pain.  No neurological deficits and normal neuro exam.  Patient can walk but states is painful.  No loss of bowel or bladder control.  No concern for cauda equina.  No fever, night sweats, weight loss, h/o cancer, IVDU. xray with disc space narrowing at multiple levels with overall progression of narrowing cyst the previous study, particularly at L4-5. No appreciable facet arthropathy. No fracture or spondylolisthesis. Aortic Atherosclerosis (ICD10-I70.0). RICE protocol and pain medicine indicated and discussed with patient.    Final Clinical Impression(s) / ED Diagnoses Final diagnoses:  Low back pain, unspecified back pain laterality, unspecified chronicity, unspecified whether sciatica present    Rx / DC  Orders ED Discharge Orders         Ordered    diclofenac Sodium (VOLTAREN) 1 % GEL  4 times daily     06/26/19 1938    lidocaine (LIDODERM) 5 %  Every 24 hours     06/26/19 1938           Karrie Meres, PA-C 06/26/19 1941    Sabas Sous, MD 06/28/19 1824

## 2019-07-03 ENCOUNTER — Encounter: Payer: Self-pay | Admitting: Internal Medicine

## 2019-07-05 ENCOUNTER — Encounter: Payer: Self-pay | Admitting: *Deleted

## 2019-07-05 DIAGNOSIS — Z006 Encounter for examination for normal comparison and control in clinical research program: Secondary | ICD-10-CM

## 2019-07-05 NOTE — Research (Signed)
AEGIS V8  Patient doing well, was diagnosed with COVID-19 06/26/2019, his dad, mom, and fiance had it. They are all recovering. He says he feel good.                                    "CONSENT"   YES     NO   Continuing further Investigational Product and study visits for follow-up? [x]  []   Continuing consent from future biomedical research [x]  []                                    "EVENTS"    YES     NO  AE   (IF YES SEE SOURCE) [x]  []   SAE  (IF YES SEE SOURCE) []  [x]   ENDPOINT   (IF YES SEE SOURCE) []  [x]   REVASCULARIZATION  (IF YES SEE SOURCE) []  [x]   AMPUTATION   (IF YES SEE SOURCE) []  [x]   TROPONIN'S  (IF YES SEE SOURCE) []  [x]    Lifestyle Adherence Assessment:   YES NO  Abstinence from smoking/remaining tobacco free X   Cardiac Diet  X  Routine physical activity and/or cardiac rehabilitation  X   [id number]  MOBILITY:    I HAVE NO PROBLEMS WALKING [x]   I HAVE SLIGHT PROBLEMS WALKING []   I HAVE MODERATE PROBLEMS WALKING []   I HAVE SEVERE PROBLEMS WALKING []   I AM UNABLE TO WALK  []     SELF-CARE:   I HAVE NO PROBLEMS WASING OR DRESSING MYSELF  [x]   I HAVE SLIGHT PROBLEMS WASHING OR DRESSING MYSELF  []   I HAVE MODERATE PROBLEMS WASHING OR DRESSING MYSELF []   I HAVE SEVERE PROBLEMS WASHING OR DRESSING MYSELF  []   I HAVE SEVERE PROBLEMS WASHING OR DRESSING MYSELF  []   I AM UNABLE TO WASH OR DRESS MYSELF []     USUAL ACTIVITIES: (E.G. WORK/STUDY/HOUSEWORK/FAMILY OR LEISURE ACTIVITIES.    I HAVE NO PROBLEMS DOING MY USUAL ACTIVITIES [x]   I HAVE SLIGHT PROBLEMS DOING MY USUAL ACTIVITIES []   I HAVE MODERATE PROBLEMS DOING MY USUAL ACTIVIITIES []   I HAVE SEVERE PROBLEMS DOING MY USUAL ACTIVITIES []   I AM UNABLE TO DO MY USUAL ACTIVITIES []     PAIN /DISCOMFORT   I HAVE NO PAIN OR DISCOMFORT [x]   I HAVE SLIGHT PAIN OR DISCOMFORT []   I HAVE MODERATE PAIN OR DISCOMFORT []   I HAVE SEVERE PAIN OR DISCOMFORT []   I HAVE EXTREME PAIN OR DISCOMFORT []     ANXIETY/DEPRESSION   I AM  NOT ANXIOUS OR DEPRESSED [x]   I AM SLIGHTLY ANXIOUS OR DEPRESSED []   I AM MODERATELY ANXIOUS OR DREPRESSED []   I AM SEVERELY ANXIOUS OR DEPRESSED []   I AM EXTREMELY ANXIOUS OR DEPRESSED []     SCALE OF 0-100 HOW WOULD YOU RATE TODAY?  0 IS THE WORSE AND 100 IS THE BEST HEALTH YOU CAN IMAGINE: 78    Current Outpatient Medications:  .  albuterol (VENTOLIN HFA) 108 (90 Base) MCG/ACT inhaler, Inhale 2 puffs into the lungs every 6 (six) hours as needed for wheezing or shortness of breath., Disp: 6.7 g, Rfl: 0 .  aspirin 81 MG tablet, Take 1 tablet (81 mg total) by mouth daily., Disp: 90 tablet, Rfl: 3 .  benzonatate (TESSALON PERLES) 100 MG capsule, Take 1 capsule (100 mg total) by mouth 3 (three) times daily as needed for  cough., Disp: 30 capsule, Rfl: 0 .  clopidogrel (PLAVIX) 75 MG tablet, Take 1 tablet (75 mg total) by mouth daily., Disp: 90 tablet, Rfl: 0 .  diclofenac Sodium (VOLTAREN) 1 % GEL, Apply 2 g topically 4 (four) times daily., Disp: 50 g, Rfl: 0 .  empagliflozin (JARDIANCE) 10 MG TABS tablet, Take 10 mg by mouth daily., Disp: 90 tablet, Rfl: 3 .  ezetimibe (ZETIA) 10 MG tablet, Take 1 tablet (10 mg total) by mouth daily., Disp: 30 tablet, Rfl: 11 .  famotidine (PEPCID) 40 MG tablet, Take 1 tablet (40 mg total) by mouth at bedtime., Disp: 90 tablet, Rfl: 0 .  fenofibrate 160 MG tablet, Take 1 tablet (160 mg total) by mouth daily., Disp: 60 tablet, Rfl: 2 .  furosemide (LASIX) 80 MG tablet, Take 80 mg by mouth 3 (three) times a week. Take 1 tab 3-4 times weekly, Disp: , Rfl:  .  glucose blood test strip, Use as instructed, Disp: 100 each, Rfl: 12 .  Insulin Glargine (LANTUS SOLOSTAR) 100 UNIT/ML Solostar Pen, Inject 72 Units into the skin 2 (two) times daily., Disp: 5 pen, Rfl: 11 .  Insulin Glargine (LANTUS) 100 UNIT/ML Solostar Pen, Inject 38 Units into the skin 3 (three) times daily., Disp: , Rfl:  .  Insulin Pen Needle (PEN NEEDLES) 31G X 6 MM MISC, Use as directed, Disp: 100  each, Rfl: 6 .  Insulin Syringes, Disposable, U-100 0.5 ML MISC, Use as directed, Disp: 100 each, Rfl: 11 .  levothyroxine (SYNTHROID) 200 MCG tablet, TAKE 1 TABLET (200 MCG TOTAL) BY MOUTH DAILY. (DOSE INCREASE), Disp: 30 tablet, Rfl: 6 .  lidocaine (LIDODERM) 5 %, Place 1 patch onto the skin daily. Remove & Discard patch within 12 hours or as directed by MD, Disp: 30 patch, Rfl: 0 .  losartan (COZAAR) 25 MG tablet, Take 1 tablet (25 mg total) by mouth daily., Disp: 60 tablet, Rfl: 2 .  metFORMIN (GLUCOPHAGE-XR) 500 MG 24 hr tablet, Take 1 tablet (500 mg total) by mouth 2 (two) times daily., Disp: 60 tablet, Rfl: 2 .  metoprolol tartrate (LOPRESSOR) 50 MG tablet, Take 1 tablet (50 mg total) by mouth 2 (two) times daily., Disp: 180 tablet, Rfl: 3 .  nitroGLYCERIN (NITROSTAT) 0.4 MG SL tablet, Place 1 tablet (0.4 mg total) under the tongue every 5 (five) minutes x 3 doses as needed for chest pain., Disp: 25 tablet, Rfl: 0 .  pantoprazole (PROTONIX) 40 MG tablet, Take 1 tablet (40 mg total) by mouth daily., Disp: 90 tablet, Rfl: 3 .  PRESCRIPTION MEDICATION, CPAP- At bedtime, Disp: , Rfl:  .  rosuvastatin (CRESTOR) 40 MG tablet, Take 1 tablet (40 mg total) by mouth daily., Disp: 30 tablet, Rfl: 11 .  sildenafil (VIAGRA) 50 MG tablet, Take 50 mg by mouth 2 (two) times a week., Disp: , Rfl:  .  TRUEplus Lancets 28G MISC, Use as directed, Disp: 100 each, Rfl: 12

## 2019-07-11 ENCOUNTER — Encounter: Payer: Self-pay | Admitting: Internal Medicine

## 2019-07-16 NOTE — Progress Notes (Signed)
Cardiology Office Note    Date:  07/17/2019  ID:  Marc Walker, DOB [birth year], MRN [id number] PCP:  Ladell Pier, MD  Cardiologist:  Dr. Jillyn Ledger ([facility])   Pt presents for f/u of CAD and edema  History of Present Illness:  Marc Walker is a 49 y.o. male with history of KM, HTN, HL, tob abuse, Chronic diastolc CHF, OSA,  CAD s/pprior myocardial infarction, s/p multipleprior stents  with last intervention 08/2015 (90% prox LAD; 70% mid LAD_(in stent:, 90% D2; 705 mid Lc; 80% mid RCA; OM2 stent patent. Last nuclear stress test in 9/17 demonstrated mild ischemia in the distal OM or PLA branch territory, felt to be low risk. Last echo 06/2016 was technically limited, EF 60-65%, grade 2 DD.  Pt found to be myxodemaous last year Had stopped thyroid meds   Admitted WCT during diuresis  QT prolonging agents stopped    I saw the pt in June 2019    In September 2020 he was admitted with CP   R/I for MI with peak trop 4315.   Cardiac cath showed patient stents and new mid RCA 95% stenosis.  He underwent PCI/DES of that leasion as well as a PCI/DES to RPAV-1 that had an 80% stensosid   Echo showed LVEF 60 to 65%  He was sent home on Brilinta and AS   Fenofibrate increased to 160 mg     Post procedure had some groin swelling, hematoma.    He was entered into the AEGIS II Trial    Since d/c he has continued to have swelling of foreskin.   Uncomfortable but never stuk    He denies CP   Breathing is stable    Past Medical History:  Diagnosis Date  . Chronic diastolic CHF (congestive heart failure) ([place]) 03/09/2017   Echo 1/18: Mild LVH, EF 60-65, Gr 2 DD, mild LAE; difficult study-no obvious wall motion abnormalities with Definity  . CKD (chronic kidney disease)   . Coronary artery disease    a. s/pprior myocardial infarction, s/p multipleprior stents placedat outside institutions with last intervention 08/2015 at [facility].  . Diabetes mellitus ([place])   . Drug addiction in remission ([place])     . Erectile dysfunction 03/02/2017  . Hyperlipidemia associated with type 2 diabetes mellitus ([place])   . Hypertension   . Hypothyroidism    a. adm with profound hypothyroidism near [facility] in 04/2017.  . MI (myocardial infarction) ([place]) 07/2009   [facility] in [place], [place] for last 2, 1st one in [place], [place]; total of 6 stents, last one 02/2014   . Morbid obesity ([place]) 07/06/2016  . Noncompliance with therapeutic plan    a. thyroid med -> profound hypothyroidism 04/2017.  Marland Kitchen OSA (obstructive sleep apnea) 07/06/2016   Moderate with AHI 21.8/hr by PSG 08/2012 now on CPAP  . Prolonged QT interval 05/14/2017  . Tobacco abuse   . Wide-complex tachycardia ([place]) 05/14/2017    Past Surgical History:  Procedure Laterality Date  . APPENDECTOMY    . CARDIAC CATHETERIZATION N/A 09/16/2015   Procedure: Left Heart Cath and Coronary Angiography;  Surgeon: Lorretta Harp, MD;  Location: [place] CV LAB;  Service: Cardiovascular;  Laterality: N/A;  . CARDIAC CATHETERIZATION N/A 09/16/2015   Procedure: Coronary Stent Intervention;  Surgeon: Lorretta Harp, MD;  Location: [place] [facility];  Service: Cardiovascular;  Laterality: N/A;  . CORONARY STENT INTERVENTION N/A 03/27/2019   Procedure: CORONARY STENT INTERVENTION;  Surgeon: Jettie Booze, MD;  Location: [facility] INV[facility] LAB;  Service: Cardiovascular;  Laterality: N/A;  . CORONARY STENT PLACEMENT    . LEFT HEART CATH AND CORONARY ANGIOGRAPHY N/A 03/27/2019   Procedure: LEFT HEART CATH AND CORONARY ANGIOGRAPHY;  Surgeon: Corky Crafts, MD;  Location[facility] INVAS[facility] CV LAB;  Service: Cardiovascular;  Laterality: N/A;    Current Medications: Current Meds  Medication Sig  . albuterol (VENTOLIN HFA) 108 (90 Base) MCG/ACT inhaler Inhale 2 puffs into the lungs every 6 (six) hours as needed for wheezing or shortness of breath.  Marland Kitchen aspirin 81 MG tablet Take 1 tablet (81 mg total) by mouth daily.  . benzonatate (TESSALON PERLES) 100 MG  capsule Take 1 capsule (100 mg total) by mouth 3 (three) times daily as needed for cough.  . clopidogrel (PLAVIX) 75 MG tablet Take 1 tablet (75 mg total) by mouth daily.  . diclofenac Sodium (VOLTAREN) 1 % GEL Apply 2 g topically 4 (four) times daily.  . empagliflozin (JARDIANCE) 10 MG TABS tablet Take 10 mg by mouth daily.  Marland Kitchen ezetimibe (ZETIA) 10 MG tablet Take 1 tablet (10 mg total) by mouth daily.  . famotidine (PEPCID) 40 MG tablet Take 1 tablet (40 mg total) by mouth at bedtime.  . fenofibrate 160 MG tablet Take 1 tablet (160 mg total) by mouth daily.  . furosemide (LASIX) 80 MG tablet Take 80 mg by mouth 3 (three) times a week. Take 1 tab 3-4 times weekly  . glucose blood test strip Use as instructed  . Insulin Glargine (LANTUS SOLOSTAR) 100 UNIT/ML Solostar Pen Inject 72 Units into the skin 2 (two) times daily.  . Insulin Glargine (LANTUS) 100 UNIT/ML Solostar Pen Inject 38 Units into the skin 3 (three) times daily.  . Insulin Pen Needle (PEN NEEDLES) 31G X 6 MM MISC Use as directed  . Insulin Syringes, Disposable, U-100 0.5 ML MISC Use as directed  . levothyroxine (SYNTHROID) 200 MCG tablet TAKE 1 TABLET (200 MCG TOTAL) BY MOUTH DAILY. (DOSE INCREASE)  . lidocaine (LIDODERM) 5 % Place 1 patch onto the skin daily. Remove & Discard patch within 12 hours or as directed by MD  . losartan (COZAAR) 25 MG tablet Take 1 tablet (25 mg total) by mouth daily.  . metFORMIN (GLUCOPHAGE-XR) 500 MG 24 hr tablet Take 1 tablet (500 mg total) by mouth 2 (two) times daily.  . metoprolol tartrate (LOPRESSOR) 50 MG tablet Take 1 tablet (50 mg total) by mouth 2 (two) times daily.  . nitroGLYCERIN (NITROSTAT) 0.4 MG SL tablet Place 1 tablet (0.4 mg total) under the tongue every 5 (five) minutes x 3 doses as needed for chest pain.  . pantoprazole (PROTONIX) 40 MG tablet Take 1 tablet (40 mg total) by mouth daily.  Marland Kitchen PRESCRIPTION MEDICATION CPAP- At bedtime  . rosuvastatin (CRESTOR) 40 MG tablet Take 1 tablet  (40 mg total) by mouth daily.  . sildenafil (VIAGRA) 50 MG tablet Take 50 mg by mouth 2 (two) times a week.  . TRUEplus Lancets 28G MISC Use as directed     Allergies:   Other, Levofloxacin, and Nsaids   Social History   Socioeconomic History  . Marital status: Divorced    Spouse name: Not on file  . Number of children: Not on file  . Years of education: Not on file  . Highest education level: Not on file  Occupational History  . Occupation: Company secretary  Tobacco Use  . Smoking status: Former Smoker    Types: Cigarettes  Quit date: 01/31/2017    Years since quitting: 2.4  . Smokeless tobacco: Never Used  . Tobacco comment: quit 01/2017  Substance and Sexual Activity  . Alcohol use: No    Alcohol/week: 0.0 standard drinks  . Drug use: No  . Sexual activity: Not Currently  Other Topics Concern  . Not on file  Social History Narrative   Adopted. Very little info on biological parents, ?diabetes.   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 Difficulty of Paying Living Expenses: Not on file  Food Insecurity:   . Worried About Programme researcher, broadcasting/film/video in the Last Year: Not on file  . Ran Out of Food in the Last Year: Not on file  Transportation Needs:   . Lack of Transportation (Medical): Not on file  . Lack of Transportation (Non-Medical): Not on file  Physical Activity:   . Days of Exercise per Week: Not on file  . Minutes of Exercise per Session: Not on file  Stress:   . Feeling of Stress : Not on file  Social Connections:   . Frequency of Communication with Friends and Family: Not on file  . Frequency of Social Gatherings with Friends and Family: Not on file  . Attends Religious Services: Not on file  . Active Member of Clubs or Organizations: Not on file  . Attends Banker Meetings: Not on file  . Marital Status: Not on file     Family History:  Family History  Adopted: Yes  Problem Relation  Age of Onset  . Diabetes Maternal Grandmother     ROS:   Please see the history of present illness.  All other systems are reviewed and otherwise negative.    PHYSICAL EXAM:   VS:  BP 132/70   Pulse 86   Ht 5' 5.5" (1.664 m)   Wt (!) 303 lb 12.8 oz (137.8 kg)   SpO2 96%   BMI 49.79 kg/m   BMI: Body mass index is 49.79 kg/m. GEN: Morbidly obesed 49 yo n no acute distress  HEENT: normocephalic, atraumatic Neck: Neck full  No obvious JVD   Cardiac: RRR;  S1, S2   no murmurs, rubs, or gallops,  Trace bilateral LE edema  Respiratory:  clear to auscultation bilaterally, normal work of breathing GI: soft, nontender, distended, + BS GU:   Pt with photos from am of penis   Foreskin is swollen   MS: no deformity or atrophy  Skin: warm and dry, no rash Neuro:  Alert and Oriented x 3, Strength and sensation are intact, follows commands, much more lively Psych: euthymic mood, full affect  Wt Readings from Last 3 Encounters:  07/17/19 (!) 303 lb 12.8 oz (137.8 kg)  06/26/19 (!) 306 lb 3.5 oz (138.9 kg)  06/06/19 (!) 306 lb 3.2 oz (138.9 kg)      Studies/Labs Reviewed:   EKG:  EKG was not ordered today   Recent Labs: 03/28/2019: ALT 44 04/02/2019: Hemoglobin 12.3; Platelets 311 04/20/2019: BUN 29; Creatinine, Ser 1.03; Potassium 4.6; Sodium 138   Lipid Panel    Component Value Date/Time   CHOL 312 (H) 05/15/2019 0936   TRIG 712 (HH) 05/15/2019 0936   HDL 36 (L) 05/15/2019 0936   CHOLHDL 8.7 (H) 05/15/2019 0936   CHOLHDL 6.6 03/26/2019 0345   VLDL UNABLE TO CALCULATE IF TRIGLYCERIDE OVER 400 mg/dL 26/71/2458 0998   LDLCALC 139 (H) 05/15/2019 3382  LDLDIRECT 76.9 03/26/2019 0345    Additional studies/ records that were reviewed today include: Summarized above.    ASSESSMENT & PLAN:   Chronic diastolic CHF - Volume is not too badk     Continue  meds    2  CAD - Asymptomatic after recent intervention   3  GU   Swelling of foreskin new since cath   Overall volume  status is not too bad, does not explain   I would recomm referral to Urology to evaluate    3  CKD  Will need to be followed  4  Hx WCT     Avoid drugs that prolong  QT  5  Hypothyroidism.   Continue meds   6Erectile dysfunction - Takes sildenafil  No NTG  6  HL Currently on Tricor as well as Aegis study drug     Disposition:  F/u later spring     Medication Adjustments/Labs and Tests Ordered: Current medicines are reviewed at length with the patient today.  Concerns regarding medicines are outlined above. Medication changes, Labs and Tests ordered today are summarized above and listed in the Patient Instructions accessible in Encounters.   Signed, Dietrich Pates, MD  07/17/2019 10:30 AM    [facility] [address] [place], [place], [place]  [address] Phone: [phone]; Fax: [phone]

## 2019-07-17 ENCOUNTER — Ambulatory Visit (INDEPENDENT_AMBULATORY_CARE_PROVIDER_SITE_OTHER): Payer: Self-pay | Admitting: Internal Medicine

## 2019-07-17 ENCOUNTER — Encounter: Payer: Self-pay | Admitting: Internal Medicine

## 2019-07-17 ENCOUNTER — Other Ambulatory Visit: Payer: Self-pay

## 2019-07-17 VITALS — BP 132/70 | HR 86 | Ht 65.5 in | Wt 303.8 lb

## 2019-07-17 DIAGNOSIS — E782 Mixed hyperlipidemia: Secondary | ICD-10-CM

## 2019-07-17 DIAGNOSIS — I2510 Atherosclerotic heart disease of native coronary artery without angina pectoris: Secondary | ICD-10-CM

## 2019-07-17 NOTE — Patient Instructions (Signed)
Medication Instructions:  No changes *If you need a refill on your cardiac medications before your next appointment, please call your pharmacy*  Lab Work: none If you have labs (blood work) drawn today and your tests are completely normal, you will receive your results only by: Marland Kitchen MyChart Message (if you have MyChart) OR . A paper copy in the mail If you have any lab test that is abnormal or we need to change your treatment, we will call you to review the results.  Testing/Procedures: none  Follow-Up: At [facility], you and your health needs are our priority.  As part of our continuing mission to provide you with exceptional heart care, we have created designated Provider Care Teams.  These Care Teams include your primary Cardiologist (physician) and Advanced Practice Providers (APPs -  Physician Assistants and Nurse Practitioners) who all work together to provide you with the care you need, when you need it.  Your next appointment:   4 month(s)  The format for your next appointment:   Either In Person or Virtual  Provider:   You may see Dietrich Pates, MD or one of the following Advanced Practice Providers on your designated Care Team:    [name], PA-C  [place], [place]  Berton Bon, NP   Other Instructions You have been scheduled at [facility] on 08/10/19 at 1:30 pm.

## 2019-07-19 ENCOUNTER — Ambulatory Visit: Payer: Self-pay | Attending: Internal Medicine | Admitting: Physician Assistant

## 2019-07-19 ENCOUNTER — Other Ambulatory Visit: Payer: Self-pay

## 2019-07-19 VITALS — BP 136/78 | HR 78 | Temp 97.8°F | Ht 65.5 in | Wt 308.0 lb

## 2019-07-19 DIAGNOSIS — E1142 Type 2 diabetes mellitus with diabetic polyneuropathy: Secondary | ICD-10-CM

## 2019-07-19 DIAGNOSIS — IMO0002 Reserved for concepts with insufficient information to code with codable children: Secondary | ICD-10-CM

## 2019-07-19 DIAGNOSIS — I10 Essential (primary) hypertension: Secondary | ICD-10-CM

## 2019-07-19 DIAGNOSIS — E039 Hypothyroidism, unspecified: Secondary | ICD-10-CM

## 2019-07-19 DIAGNOSIS — E785 Hyperlipidemia, unspecified: Secondary | ICD-10-CM

## 2019-07-19 DIAGNOSIS — I5032 Chronic diastolic (congestive) heart failure: Secondary | ICD-10-CM

## 2019-07-19 DIAGNOSIS — E1165 Type 2 diabetes mellitus with hyperglycemia: Secondary | ICD-10-CM

## 2019-07-19 LAB — POCT GLYCOSYLATED HEMOGLOBIN (HGB A1C): Hemoglobin A1C: 13.5 % — AB (ref 4.0–5.6)

## 2019-07-19 LAB — GLUCOSE, POCT (MANUAL RESULT ENTRY): POC Glucose: 136 mg/dl — AB (ref 70–99)

## 2019-07-19 MED ORDER — JARDIANCE 10 MG PO TABS: 10.0000 mg | ORAL_TABLET | Freq: Every day | ORAL | 3 refills | Status: DC

## 2019-07-19 MED ORDER — EZETIMIBE 10 MG PO TABS: 10.0000 mg | ORAL_TABLET | Freq: Every day | ORAL | 11 refills | Status: DC

## 2019-07-19 MED ORDER — INSULIN LISPRO 100 UNIT/ML ~~LOC~~ SOLN: 38.0000 [IU] | Freq: Three times a day (TID) | SUBCUTANEOUS | 11 refills | Status: DC

## 2019-07-19 MED ORDER — METFORMIN HCL ER 500 MG PO TB24: 500.0000 mg | ORAL_TABLET | Freq: Two times a day (BID) | ORAL | 2 refills | Status: AC

## 2019-07-19 MED ORDER — PEN NEEDLES 31G X 6 MM MISC: 6 refills | Status: AC

## 2019-07-19 MED ORDER — LEVOTHYROXINE SODIUM 200 MCG PO TABS: ORAL_TABLET | ORAL | 6 refills | Status: DC

## 2019-07-19 MED ORDER — ROSUVASTATIN CALCIUM 40 MG PO TABS: 40.0000 mg | ORAL_TABLET | Freq: Every day | ORAL | 11 refills | Status: DC

## 2019-07-19 MED ORDER — LOSARTAN POTASSIUM 25 MG PO TABS: 25.0000 mg | ORAL_TABLET | Freq: Every day | ORAL | 2 refills | Status: DC

## 2019-07-19 MED ORDER — METOPROLOL TARTRATE 50 MG PO TABS: 50.0000 mg | ORAL_TABLET | Freq: Two times a day (BID) | ORAL | 3 refills | Status: DC

## 2019-07-19 MED ORDER — FUROSEMIDE 80 MG PO TABS: 80.0000 mg | ORAL_TABLET | ORAL | 5 refills | Status: DC

## 2019-07-19 MED ORDER — LANTUS SOLOSTAR 100 UNIT/ML ~~LOC~~ SOPN: 72.0000 [IU] | PEN_INJECTOR | Freq: Two times a day (BID) | SUBCUTANEOUS | 11 refills | Status: DC

## 2019-07-19 MED FILL — $LANTUS SOLOSTAR 100 UNITS/: 100 | 80 days supply | Qty: 120 | Fill #0

## 2019-07-19 NOTE — Progress Notes (Signed)
Pt. Stated when he went to donate plasma they said his blood pressure was high.

## 2019-07-19 NOTE — Progress Notes (Signed)
Patient ID: Marc Walker, male   DOB: [birth year], 49 y.o.   MRN: [id number]   Marc Walker, is a 49 y.o. male  AYT:[id number]  [id number]:[id number]  DOB - 01-24-1971  Subjective:  Chief Complaint and HPI: Marc Walker is a 49 y.o. male here today with concerns of being unable to donate plasma bc his blood has too much fat and protein in it.  He has been donating plasma since having had covid but lately has been unable to donate bc blood is "lipiemic."  He does not check his blood sugars regularly.  He says he is complaint with his med "97% of the time."  He admits to poor diet and "drinking sodas" sometimes.  He does have polyuria.  Wants something to curb his appetite bc he says he wants to eat all the time.  ROS:   Constitutional:  No f/c, No night sweats, No unexplained weight loss. EENT:  No vision changes, No blurry vision, No hearing changes. No mouth, throat, or ear problems.  Respiratory: No cough, No SOB Cardiac: No CP, no palpitations GI:  No abd pain, No N/V/D. GU: No Urinary s/sx Musculoskeletal: No joint pain Neuro: No headache, no dizziness, no motor weakness.  Skin: No rash Endocrine:  No polydipsia. + polyuria.  Psych: Denies SI/HI  No problems updated.  ALLERGIES: Allergies  Allergen Reactions  . 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  Pt only wants pain medicine if absolutely necessary.  . Levofloxacin Rash    Rash on back (not sun exposed)and hypersensitivity to skin on exposed skin/arm  . Nsaids Other (See Comments)    Avoid due to CKD    PAST MEDICAL HISTORY: Past Medical History:  Diagnosis Date  . Chronic diastolic CHF (congestive heart failure) ([place]) 03/09/2017   Echo 1/18: Mild LVH, EF 60-65, Gr 2 DD, mild LAE; difficult study-no obvious wall motion abnormalities with Definity  . CKD (chronic kidney disease)   . Coronary artery disease    a. s/pprior myocardial infarction, s/p multipleprior stents  placedat outside institutions with last intervention 08/2015 at [facility].  . Diabetes mellitus ([place])   . Drug addiction in remission ([place])   . Erectile dysfunction 03/02/2017  . Hyperlipidemia associated with type 2 diabetes mellitus ([place])   . Hypertension   . Hypothyroidism    a. adm with profound hypothyroidism near [facility] in 04/2017.  . MI (myocardial infarction) ([place]) 07/2009   [place] in [place], [place] for last 2, 1st one in [place], [place]; total of 6 stents, last one 02/2014   . Morbid obesity ([place]) 07/06/2016  . Noncompliance with therapeutic plan    a. thyroid med -> profound hypothyroidism 04/2017.  Marland Kitchen OSA (obstructive sleep apnea) 07/06/2016   Moderate with AHI 21.8/hr by PSG 08/2012 now on CPAP  . Prolonged QT interval 05/14/2017  . Tobacco abuse   . Wide-complex tachycardia ([place]) 05/14/2017    MEDICATIONS AT HOME: Prior to Admission medications   Medication Sig Start Date End Date Taking? Authorizing Provider  albuterol (VENTOLIN HFA) 108 (90 Base) MCG/ACT inhaler Inhale 2 puffs into the lungs every 6 (six) hours as needed for wheezing or shortness of breath. 06/18/19  Yes Ladell Pier, MD  aspirin 81 MG tablet Take 1 tablet (81 mg total) by mouth daily. 11/17/16  Yes Langeland, Dawn T, MD  clopidogrel (PLAVIX) 75 MG tablet Take 1 tablet (75 mg total) by mouth daily. 05/29/19  Yes Pricilla Riffle, MD  diclofenac Sodium (VOLTAREN) 1 % GEL Apply 2 g topically 4 (four) times daily. 06/26/19  Yes Couture, Cortni S, PA-C  empagliflozin (JARDIANCE) 10 MG TABS tablet Take 10 mg by mouth daily. 07/19/19  Yes Georgian Co M, PA-C  ezetimibe (ZETIA) 10 MG tablet Take 1 tablet (10 mg total) by mouth daily. 07/19/19  Yes Anders Simmonds, PA-C  famotidine (PEPCID) 40 MG tablet Take 1 tablet (40 mg total) by mouth at bedtime. 10/21/18  Yes Weaver, Scott T, PA-C  fenofibrate 160 MG tablet Take 1 tablet (160 mg total) by mouth daily. 03/30/19  Yes Georgie Chard D, NP  furosemide  (LASIX) 80 MG tablet Take 1 tablet (80 mg total) by mouth 3 (three) times a week. Take 1 tab 3-4 times weekly 07/19/19  Yes Anders Simmonds, PA-C  glucose blood test strip Use as instructed 05/09/18  Yes Marcine Matar, MD  Insulin Glargine (LANTUS SOLOSTAR) 100 UNIT/ML Solostar Pen Inject 72 Units into the skin 2 (two) times daily. 07/19/19  Yes Georgian Co M, PA-C  Insulin Pen Needle (PEN NEEDLES) 31G X 6 MM MISC Use as directed 07/19/19  Yes Anders Simmonds, PA-C  Insulin Syringes, Disposable, U-100 0.5 ML MISC Use as directed 06/11/17  Yes Marcine Matar, MD  levothyroxine (SYNTHROID) 200 MCG tablet TAKE 1 TABLET (200 MCG TOTAL) BY MOUTH DAILY. (DOSE INCREASE) 07/19/19  Yes ,  M, PA-C  lidocaine (LIDODERM) 5 % Place 1 patch onto the skin daily. Remove & Discard patch within 12 hours or as directed by MD 06/26/19  Yes Couture, Cortni S, PA-C  losartan (COZAAR) 25 MG tablet Take 1 tablet (25 mg total) by mouth daily. 07/19/19  Yes ,  M, PA-C  metFORMIN (GLUCOPHAGE-XR) 500 MG 24 hr tablet Take 1 tablet (500 mg total) by mouth 2 (two) times daily. 07/19/19  Yes Georgian Co M, PA-C  metoprolol tartrate (LOPRESSOR) 50 MG tablet Take 1 tablet (50 mg total) by mouth 2 (two) times daily. 07/19/19  Yes , Marzella Schlein, PA-C  nitroGLYCERIN (NITROSTAT) 0.4 MG SL tablet Place 1 tablet (0.4 mg total) under the tongue every 5 (five) minutes x 3 doses as needed for chest pain. 03/29/19  Yes Georgie Chard D, NP  pantoprazole (PROTONIX) 40 MG tablet Take 1 tablet (40 mg total) by mouth daily. 02/16/19  Yes Marcine Matar, MD  PRESCRIPTION MEDICATION CPAP- At bedtime   Yes [provider]  rosuvastatin (CRESTOR) 40 MG tablet Take 1 tablet (40 mg total) by mouth daily. 07/19/19 07/18/20 Yes , Marzella Schlein, PA-C  sildenafil (VIAGRA) 50 MG tablet Take 50 mg by mouth 2 (two) times a week.   Yes [provider]  TRUEplus Lancets 28G MISC Use as directed  12/08/18  Yes Marcine Matar, MD  benzonatate (TESSALON PERLES) 100 MG capsule Take 1 capsule (100 mg total) by mouth 3 (three) times daily as needed for cough. Patient not taking: Reported on 07/19/2019 06/18/19   Marcine Matar, MD  insulin lispro (HUMALOG) 100 UNIT/ML injection Inject 0.38 mLs (38 Units total) into the skin 3 (three) times daily before meals. 07/19/19   Anders Simmonds, PA-C     Objective:  EXAM:   Vitals:   07/19/19 1000  BP: 136/78  Pulse: 78  Temp: 97.8 F (36.6 C)  TempSrc: Oral  SpO2: 95%  Weight: (!) 308 lb (139.7 kg)  Height: 5' 5.5" (1.664 m)    General appearance :  A&OX3. NAD. Non-toxic-appearing, obese, walks with a cane HEENT: Atraumatic and Normocephalic.  PERRLA. EOM intact.  Neck: supple, no JVD. No cervical lymphadenopathy. No thyromegaly Chest/Lungs:  Breathing-non-labored, Good air entry bilaterally, breath sounds normal without rales, rhonchi, or wheezing  CVS: S1 S2 regular, no murmurs, gallops, rubs  Extremities: Bilateral Lower Ext shows no edema, both legs are warm to touch with = pulse throughout Neurology:  CN II-XII grossly intact, Non focal.   Psych:  TP linear. J/I WNL. Normal speech. Appropriate eye contact and affect.  Skin:  No Rash  Data Review Lab Results  Component Value Date   HGBA1C 13.5 (A) 07/19/2019   HGBA1C 12.4 (H) 02/20/2019   HGBA1C 8.9 (A) 08/11/2018     Assessment & Plan   1. Uncontrolled type 2 diabetes mellitus with peripheral neuropathy (HCC) Diet changes imperative.  Check blood sugars tid and assess in 3 weeks.  Questionable compliance - Glucose (CBG) - HgB A1c - empagliflozin (JARDIANCE) 10 MG TABS tablet; Take 10 mg by mouth daily.  Dispense: 90 tablet; Refill: 3 - metFORMIN (GLUCOPHAGE-XR) 500 MG 24 hr tablet; Take 1 tablet (500 mg total) by mouth 2 (two) times daily.  Dispense: 60 tablet; Refill: 2 - Insulin Glargine (LANTUS SOLOSTAR) 100 UNIT/ML Solostar Pen; Inject 72 Units into the skin  2 (two) times daily.  Dispense: 5 pen; Refill: 11 - Insulin Pen Needle (PEN NEEDLES) 31G X 6 MM MISC; Use as directed  Dispense: 100 each; Refill: 6 - insulin lispro (HUMALOG) 100 UNIT/ML injection; Inject 0.38 mLs (38 Units total) into the skin 3 (three) times daily before meals.  Dispense: 10 mL; Refill: 11 - Comprehensive metabolic panel  2. Hypothyroidism, unspecified type - Thyroid Panel With TSH - levothyroxine (SYNTHROID) 200 MCG tablet; TAKE 1 TABLET (200 MCG TOTAL) BY MOUTH DAILY. (DOSE INCREASE)  Dispense: 30 tablet; Refill: 6  3. Chronic diastolic CHF (congestive heart failure) (HCC) - furosemide (LASIX) 80 MG tablet; Take 1 tablet (80 mg total) by mouth 3 (three) times a week. Take 1 tab 3-4 times weekly  Dispense: 30 tablet; Refill: 5  4. Morbid obesity (HCC) Diet changes discussed at length.  Spent >67mins face to face on this and diabetes education  5. Essential hypertension BP at goal today - metoprolol tartrate (LOPRESSOR) 50 MG tablet; Take 1 tablet (50 mg total) by mouth 2 (two) times daily.  Dispense: 180 tablet; Refill: 3 - losartan (COZAAR) 25 MG tablet; Take 1 tablet (25 mg total) by mouth daily.  Dispense: 60 tablet; Refill: 2 - Comprehensive metabolic panel  6. Hyperlipidemia, unspecified hyperlipidemia type - rosuvastatin (CRESTOR) 40 MG tablet; Take 1 tablet (40 mg total) by mouth daily.  Dispense: 30 tablet; Refill: 11 - ezetimibe (ZETIA) 10 MG tablet; Take 1 tablet (10 mg total) by mouth daily.  Dispense: 30 tablet; Refill: 11 - Comprehensive metabolic panel - Lipid panel     Patient have been counseled extensively about nutrition and exercise  Return in about 3 weeks (around 08/09/2019) for with [facility] for diabetes management.  The patient was given clear instructions to go to ER or return to medical center if symptoms don't improve, worsen or new problems develop. The patient verbalized understanding. The patient was told to call to get lab results if  they haven't heard anything in the next week.     Georgian Co, PA-C [facility] [place], [place] [phone]   07/19/2019, 12:00 PM

## 2019-07-19 NOTE — Patient Instructions (Addendum)
Check blood sugars 3 times daily and record and bring to next visit.  Eliminate sugar and white carbohydrates

## 2019-07-20 ENCOUNTER — Other Ambulatory Visit: Payer: Self-pay | Admitting: Physician Assistant

## 2019-07-20 DIAGNOSIS — E782 Mixed hyperlipidemia: Secondary | ICD-10-CM

## 2019-07-20 DIAGNOSIS — IMO0002 Reserved for concepts with insufficient information to code with codable children: Secondary | ICD-10-CM

## 2019-07-20 DIAGNOSIS — E1142 Type 2 diabetes mellitus with diabetic polyneuropathy: Secondary | ICD-10-CM

## 2019-07-20 DIAGNOSIS — E039 Hypothyroidism, unspecified: Secondary | ICD-10-CM

## 2019-07-20 LAB — COMPREHENSIVE METABOLIC PANEL
ALT: 22 IU/L (ref 0–44)
AST: 15 IU/L (ref 0–40)
Albumin/Globulin Ratio: 1.6 (ref 1.2–2.2)
Albumin: 4.2 g/dL (ref 4.0–5.0)
Alkaline Phosphatase: 68 IU/L (ref 39–117)
BUN/Creatinine Ratio: 30 — ABNORMAL HIGH (ref 9–20)
BUN: 25 mg/dL — ABNORMAL HIGH (ref 6–24)
Bilirubin Total: <0.2 mg/dL (ref 0.0–1.2)
CO2: 24 mmol/L (ref 20–29)
Calcium: 9.8 mg/dL (ref 8.7–10.2)
Chloride: 100 mmol/L (ref 96–106)
Creatinine, Ser: 0.82 mg/dL (ref 0.76–1.27)
GFR calc Af Amer: 119 mL/min/{1.73_m2} (ref >59)
GFR calc non Af Amer: 103 mL/min/{1.73_m2} (ref >59)
Globulin, Total: 2.6 g/dL (ref 1.5–4.5)
Glucose: 74 mg/dL (ref 65–99)
Potassium: 3.7 mmol/L (ref 3.5–5.2)
Sodium: 140 mmol/L (ref 134–144)
Total Protein: 6.8 g/dL (ref 6.0–8.5)

## 2019-07-20 LAB — LIPID PANEL
Chol/HDL Ratio: 8 ratio — ABNORMAL HIGH (ref 0.0–5.0)
Cholesterol, Total: 272 mg/dL — ABNORMAL HIGH (ref 100–199)
HDL: 34 mg/dL — ABNORMAL LOW (ref >39)
Triglycerides: 1142 mg/dL (ref 0–149)

## 2019-07-20 LAB — THYROID PANEL WITH TSH
Free Thyroxine Index: 1.4 (ref 1.2–4.9)
T3 Uptake Ratio: 25 % (ref 24–39)
T4, Total: 5.6 ug/dL (ref 4.5–12.0)
TSH: 11.2 u[IU]/mL — ABNORMAL HIGH (ref 0.450–4.500)

## 2019-07-20 MED ORDER — LEVOTHYROXINE SODIUM 25 MCG PO TABS: 25.0000 ug | ORAL_TABLET | Freq: Every day | ORAL | 1 refills | Status: DC

## 2019-07-21 ENCOUNTER — Telehealth (INDEPENDENT_AMBULATORY_CARE_PROVIDER_SITE_OTHER): Payer: Self-pay

## 2019-07-21 NOTE — Telephone Encounter (Signed)
Patient verified date of birth. He had already viewed results on my chart. He did not fully understand results. Reviewed results with him. He then verbalized full understanding of all results. Maryjean Morn, CMA

## 2019-07-21 NOTE — Telephone Encounter (Signed)
-----   Message from Anders Simmonds, [place] sent at 07/20/2019 10:04 AM EST ----- I have increased your thyroid medication dose to daily(I sent you a new prescription of to take inaddition to the 200 you are already on).  I am referring you to endocrinology since your blood sugars are so high and h=your triglycerides are so high, and bc of the thyroid disease.  Please make sure you are taking all of your medications and recording your blood sugars as we have discussed.  Thanks, Georgian Co, PA-C

## 2019-08-01 ENCOUNTER — Other Ambulatory Visit: Payer: Self-pay

## 2019-08-01 DIAGNOSIS — I25118 Atherosclerotic heart disease of native coronary artery with other forms of angina pectoris: Secondary | ICD-10-CM

## 2019-08-01 DIAGNOSIS — E782 Mixed hyperlipidemia: Secondary | ICD-10-CM

## 2019-08-01 LAB — LIPID PANEL
Chol/HDL Ratio: 42.6 ratio — ABNORMAL HIGH (ref 0.0–5.0)
Cholesterol, Total: 597 mg/dL — ABNORMAL HIGH (ref 100–199)
HDL: 14 mg/dL — ABNORMAL LOW (ref >39)
Triglycerides: 4065 mg/dL (ref 0–149)

## 2019-08-01 LAB — HEPATIC FUNCTION PANEL
ALT: 45 IU/L — ABNORMAL HIGH (ref 0–44)
AST: 24 IU/L (ref 0–40)
Albumin: 3.8 g/dL — ABNORMAL LOW (ref 4.0–5.0)
Alkaline Phosphatase: 82 IU/L (ref 39–117)
Bilirubin Total: <0.2 mg/dL (ref 0.0–1.2)
Bilirubin, Direct: 0.16 mg/dL (ref 0.00–0.40)
Total Protein: 6.6 g/dL (ref 6.0–8.5)

## 2019-08-01 LAB — LDL CHOLESTEROL, DIRECT: LDL Direct: 66 mg/dL (ref 0–99)

## 2019-08-02 ENCOUNTER — Telehealth: Payer: Self-pay | Admitting: Pharmacist

## 2019-08-02 DIAGNOSIS — E782 Mixed hyperlipidemia: Secondary | ICD-10-CM

## 2019-08-02 NOTE — Telephone Encounter (Signed)
Called pt to discuss lipid panel results - spent 25 minutes on the phone discussing. LDL is controlled at 66 - pt reports compliance with rosuvastatin 40mg  daily and ezetimibe 10mg  daily. However, his TG have increased drastically to 4065 (previously 1142 two weeks ago, 712 two months ago, and 132 in 2019). He looked at his pill bottles while were on the phone and he reports taking fenofibrate 160mg  daily. Discussed correlation between elevated TG and glucose - A1c elevated at 13.5. He reports compliance to Jardiance, metformin, and insulin. Is being referred to an endocrinologist.  States he hasn't had alcohol since 1992. Denies eating excess carbs or sugar, adds Splenda to his coffee. Drinks mostly water, some OJ, a lot of whole milk - advised him to change to at least 2%. Drinks 1 2L of soda every few weeks, reports he's always thirsty. Advised this is from his elevated blood sugar and encouraged him to decrease soda intake.  No exercise - states he has arthritis in his hips and knees, also pending urologist visit on 2/11 for groin swelling. States walking hurts because of this.  Discussed adding fish oil. He does not have insurance, has not followed up on Medicaid application he states was started in the hospital. Uses the for meds at 1993, states he's applying for disability. Won't be able to afford Lovaza or Vascepa unfortunately, but he is willing to try 4g of OTC fish oil daily after much discussion (took this before and states he wanted to throw up from fishy aftertaste). Discussed need for additional medication as he is at risk for pancreatitis with such elevated TG. He cannot think of anything out of the ordinary over the past few weeks that would have caused his TG to quadruple. Confirms labs were fasting. Will recheck direct LDL and lipid panel in 6 weeks.

## 2019-08-08 ENCOUNTER — Emergency Department (HOSPITAL_BASED_OUTPATIENT_CLINIC_OR_DEPARTMENT_OTHER): Payer: Self-pay

## 2019-08-08 ENCOUNTER — Encounter (HOSPITAL_BASED_OUTPATIENT_CLINIC_OR_DEPARTMENT_OTHER): Payer: Self-pay | Admitting: *Deleted

## 2019-08-08 ENCOUNTER — Emergency Department (HOSPITAL_BASED_OUTPATIENT_CLINIC_OR_DEPARTMENT_OTHER)
Admission: EM | Admit: 2019-08-08 | Discharge: 2019-08-08 | Disposition: A | Discharge status: Home / Self Care | Payer: Self-pay | Attending: Emergency Medicine | Admitting: Emergency Medicine

## 2019-08-08 ENCOUNTER — Ambulatory Visit: Payer: Self-pay | Admitting: Pharmacist

## 2019-08-08 ENCOUNTER — Other Ambulatory Visit: Payer: Self-pay

## 2019-08-08 DIAGNOSIS — E1169 Type 2 diabetes mellitus with other specified complication: Secondary | ICD-10-CM | POA: Insufficient documentation

## 2019-08-08 DIAGNOSIS — I2510 Atherosclerotic heart disease of native coronary artery without angina pectoris: Secondary | ICD-10-CM | POA: Insufficient documentation

## 2019-08-08 DIAGNOSIS — E039 Hypothyroidism, unspecified: Secondary | ICD-10-CM | POA: Insufficient documentation

## 2019-08-08 DIAGNOSIS — I5032 Chronic diastolic (congestive) heart failure: Secondary | ICD-10-CM | POA: Insufficient documentation

## 2019-08-08 DIAGNOSIS — Z9861 Coronary angioplasty status: Secondary | ICD-10-CM | POA: Insufficient documentation

## 2019-08-08 DIAGNOSIS — Z87891 Personal history of nicotine dependence: Secondary | ICD-10-CM | POA: Insufficient documentation

## 2019-08-08 DIAGNOSIS — R0789 Other chest pain: Secondary | ICD-10-CM | POA: Insufficient documentation

## 2019-08-08 DIAGNOSIS — N189 Chronic kidney disease, unspecified: Secondary | ICD-10-CM | POA: Insufficient documentation

## 2019-08-08 DIAGNOSIS — I252 Old myocardial infarction: Secondary | ICD-10-CM | POA: Insufficient documentation

## 2019-08-08 DIAGNOSIS — Z881 Allergy status to other antibiotic agents status: Secondary | ICD-10-CM | POA: Insufficient documentation

## 2019-08-08 DIAGNOSIS — I130 Hypertensive heart and chronic kidney disease with heart failure and stage 1 through stage 4 chronic kidney disease, or unspecified chronic kidney disease: Secondary | ICD-10-CM | POA: Insufficient documentation

## 2019-08-08 DIAGNOSIS — E1122 Type 2 diabetes mellitus with diabetic chronic kidney disease: Secondary | ICD-10-CM | POA: Insufficient documentation

## 2019-08-08 DIAGNOSIS — E785 Hyperlipidemia, unspecified: Secondary | ICD-10-CM | POA: Insufficient documentation

## 2019-08-08 MED ORDER — DICLOFENAC SODIUM 1 % EX GEL: 2.0000 g | Freq: Four times a day (QID) | CUTANEOUS | 0 refills | Status: AC | PRN

## 2019-08-08 MED ORDER — METHOCARBAMOL 500 MG PO TABS: 500.0000 mg | ORAL_TABLET | Freq: Three times a day (TID) | ORAL | 0 refills | Status: DC | PRN

## 2019-08-08 NOTE — ED Triage Notes (Signed)
Cough. Right rib pain after coughing. Hx of Covid in December.

## 2019-08-08 NOTE — ED Provider Notes (Signed)
Emergency Department Provider Note   I have reviewed the triage vital signs and the nursing notes.   HISTORY  Chief Complaint Cough and Chest Pain   HPI Marc Walker is a 49 y.o. male presents to the ED with right sided chest pain. He reports that 1 week prior he was laying on his bed when he had a single hard cough at which time he had a sharp pain in the right chest. Pain is moderate and worse with movement. He feels worse with touching the area. He denies radiation of symptoms. Pain does keep him from taking a deep breath but denies feeling particularly SOB. No fever.     Past Medical History:  Diagnosis Date  . Chronic diastolic CHF (congestive heart failure) ([place]) 03/09/2017   Echo 1/18: Mild LVH, EF 60-65, Gr 2 DD, mild LAE; difficult study-no obvious wall motion abnormalities with Definity  . CKD (chronic kidney disease)   . Coronary artery disease    a. s/pprior myocardial infarction, s/p multipleprior stents placedat outside institutions with last intervention 08/2015 at [facility]  . Diabetes mellitus ([place])   . Drug addiction in remission ([place])   . Erectile dysfunction 03/02/2017  . Hyperlipidemia associated with type 2 diabetes mellitus ([place])   . Hypertension   . Hypothyroidism    a. adm with profound hypothyroidism near [facility] in 04/2017.  . MI (myocardial infarction) ([place]) 07/2009   [facility] in [place], [place] for last 2, 1st one in [place], [place]; total of 6 stents, last one 02/2014   . Morbid obesity ([place]) 07/06/2016  . Noncompliance with therapeutic plan    a. thyroid med -> profound hypothyroidism 04/2017.  Marland Kitchen OSA (obstructive sleep apnea) 07/06/2016   Moderate with AHI 21.8/hr by PSG 08/2012 now on CPAP  . Prolonged QT interval 05/14/2017  . Tobacco abuse   . Wide-complex tachycardia ([place]) 05/14/2017    Patient Active Problem List   Diagnosis Date Noted  . COVID-19 virus infection 06/18/2019  . ACS (acute coronary syndrome) ([place]) 03/25/2019  . NSTEMI  (non-ST elevated myocardial infarction) ([place]) 03/25/2019  . Primary osteoarthritis of both hips 02/16/2019  . Dyspnea on exertion 12/04/2018  . CAD S/P multiple PCI's. patient reported 9 previous stents; 9/20 DES to RPAV-1 and DES to mid RCA -1; 3/17 DES to LAD and DES to mid RCA along with Angiosculpt balloon angioplas 08/09/2017  . Prolonged QT interval 05/14/2017  . Wide-complex tachycardia ([place]) 05/14/2017  . GERD (gastroesophageal reflux disease) 05/13/2017  . Hypothyroidism   . Bilateral carpal tunnel syndrome 04/08/2017  . Chronic diastolic CHF (congestive heart failure) ([place]) 03/09/2017  . Erectile dysfunction 03/02/2017  . OSA on CPAP 07/06/2016  . Morbid obesity ([place]) 07/06/2016  . DM type 2 with diabetic peripheral neuropathy ([place]) 12/16/2015  . CAD (coronary artery disease) 09/30/2015  . Essential hypertension 09/16/2015  . Hyperlipidemia     Past Surgical History:  Procedure Laterality Date  . APPENDECTOMY    . CARDIAC CATHETERIZATION N/A 09/16/2015   Procedure: Left Heart Cath and Coronary Angiography;  Surgeon: Lorretta Harp, MD;  Location: [facility];  Service: Cardiovascular;  Laterality: N/A;  . CARDIAC CATHETERIZATION N/A 09/16/2015   Procedure: Coronary Stent Intervention;  Surgeon: Lorretta Harp, MD;  Location: [facility];  Service: Cardiovascular;  Laterality: N/A;  . CORONARY STENT INTERVENTION N/A 03/27/2019   Procedure: CORONARY STENT INTERVENTION;  Surgeon: Jettie Booze, MD;  Location: [facility];  Service: Cardiovascular;  Laterality: N/A;  . CORONARY STENT PLACEMENT    . LEFT HEART CATH AND CORONARY ANGIOGRAPHY N/A 03/27/2019   Procedure: LEFT HEART CATH AND CORONARY ANGIOGRAPHY;  Surgeon: Corky Crafts, MD;  Location: [facility] INVASIVE [facility] LAB;  Service: Cardiovascular;  Laterality: N/A;    Allergies Other, Levofloxacin, and Nsaids  Family History  Adopted: Yes  Problem Relation Age of Onset  . Diabetes Maternal  Grandmother     Social History Social History   Tobacco Use  . Smoking status: Former Smoker    Types: Cigarettes    Quit date: 01/31/2017    Years since quitting: 2.5  . Smokeless tobacco: Never Used  . Tobacco comment: quit 01/2017  Substance Use Topics  . Alcohol use: No    Alcohol/week: 0.0 standard drinks  . Drug use: No    Review of Systems  Constitutional: No fever/chills Cardiovascular: Positive right chest wall pain.  Respiratory: Denies shortness of breath. Gastrointestinal: No abdominal pain.  Musculoskeletal: Negative for back pain. Skin: Negative for rash.  10-point ROS otherwise negative.  ____________________________________________   PHYSICAL EXAM:  VITAL SIGNS: ED Triage Vitals  Enc Vitals Group     BP 08/08/19 1705 (!) 167/83     Pulse Rate 08/08/19 1705 89     Resp 08/08/19 1705 20     Temp 08/08/19 1705 98.4 F (36.9 C)     Temp Source 08/08/19 1705 Oral     SpO2 08/08/19 1705 97 %     Weight 08/08/19 1701 (!) 307 lb 15.7 oz (139.7 kg)     Height 08/08/19 1701 5' 5.5" (1.664 m)   Constitutional: Alert and oriented. Well appearing and in no acute distress. Eyes: Conjunctivae are normal. Head: Atraumatic. Neck: No stridor.  Cardiovascular: Normal rate, regular rhythm. Good peripheral circulation. Grossly normal heart sounds.   Respiratory: Normal respiratory effort.  No retractions. Lungs CTAB. Gastrointestinal:  No distention.  Musculoskeletal: Focal tenderness over the right lateral chest wall that reproduces the patient's pain.  Neurologic:  Normal speech and language.  Skin:  Skin is warm, dry and intact. No rash noted.  ____________________________________________  RADIOLOGY  No results found.  ____________________________________________   PROCEDURES  Procedure(s) performed:   Procedures  None ____________________________________________   INITIAL IMPRESSION / ASSESSMENT AND PLAN / ED COURSE  Pertinent labs & imaging  results that were available during my care of the patient were reviewed by me and considered in my medical decision making (see chart for details).   Patient presents to the ED with right lateral chest wall pain. Pain is easily reproducible on exam and seem to be MSK/chest wall in nature. Doubt PE/ACS. Imaging reviewed. Plan for incentive spirometry and topical pain medication. Discussed PCP follow up plan and ED return precautions.    ____________________________________________  FINAL CLINICAL IMPRESSION(S) / ED DIAGNOSES  Final diagnoses:  Acute chest wall pain    NEW OUTPATIENT MEDICATIONS STARTED DURING THIS VISIT:  Discharge Medication List as of 08/08/2019  6:28 PM    START taking these medications   Details  methocarbamol (ROBAXIN) 500 MG tablet Take 1 tablet (500 mg total) by mouth every 8 (eight) hours as needed for muscle spasms., Starting Tue 08/08/2019, Print        Note:  This document was prepared using Dragon voice recognition software and may include unintentional dictation errors.  Alona Bene, MD, [facility]    Maizy Davanzo, Arlyss Repress, MD 08/09/19 Serena Croissant

## 2019-08-08 NOTE — Discharge Instructions (Signed)

## 2019-08-11 ENCOUNTER — Other Ambulatory Visit: Payer: Self-pay | Admitting: Urology

## 2019-08-11 ENCOUNTER — Encounter: Payer: Self-pay | Admitting: Internal Medicine

## 2019-08-11 MED ORDER — NYSTATIN 100000 UNIT/GM EX POWD: 1.0000 "application " | Freq: Three times a day (TID) | CUTANEOUS | 0 refills | Status: AC

## 2019-08-11 NOTE — Progress Notes (Signed)
Meds sent

## 2019-08-14 MED FILL — NYSTATIN 100,000 UNIT/GM PO: 100000 | 30 days supply | Qty: 180 | Fill #0

## 2019-09-06 ENCOUNTER — Other Ambulatory Visit: Payer: Self-pay

## 2019-09-07 ENCOUNTER — Ambulatory Visit: Payer: Self-pay | Admitting: Endocrinology

## 2019-09-11 ENCOUNTER — Other Ambulatory Visit: Payer: Self-pay

## 2019-09-14 ENCOUNTER — Encounter: Payer: Self-pay | Admitting: *Deleted

## 2019-09-14 DIAGNOSIS — Z006 Encounter for examination for normal comparison and control in clinical research program: Secondary | ICD-10-CM

## 2019-09-14 NOTE — Research (Signed)
V9   Patient doing well, no complaints.  Updated meds.  AE have been reported all ready.                                    "CONSENT"   YES     NO   Continuing further Investigational Product and study visits for follow-up? [x]  []   Continuing consent from future biomedical research [x]  []                                    "EVENTS"    YES     NO  AE   (IF YES SEE SOURCE) [x]  []   SAE  (IF YES SEE SOURCE) []  [x]   ENDPOINT   (IF YES SEE SOURCE) []  [x]   REVASCULARIZATION  (IF YES SEE SOURCE) []  [x]   AMPUTATION   (IF YES SEE SOURCE) []  [x]   TROPONIN'S  (IF YES SEE SOURCE) []  [x]    Lifestyle Adherence Assessment:   YES NO  Abstinence from smoking/remaining tobacco free X   Cardiac Diet  X  Routine physical activity and/or cardiac rehabilitation  X    Current Outpatient Medications:  .  albuterol (VENTOLIN HFA) 108 (90 Base) MCG/ACT inhaler, Inhale 2 puffs into the lungs every 6 (six) hours as needed for wheezing or shortness of breath., Disp: 6.7 g, Rfl: 0 .  aspirin 81 MG tablet, Take 1 tablet (81 mg total) by mouth daily., Disp: 90 tablet, Rfl: 3 .  clopidogrel (PLAVIX) 75 MG tablet, Take 1 tablet (75 mg total) by mouth daily., Disp: 90 tablet, Rfl: 0 .  diclofenac Sodium (VOLTAREN) 1 % GEL, Apply 2 g topically 4 (four) times daily as needed., Disp: 50 g, Rfl: 0 .  empagliflozin (JARDIANCE) 10 MG TABS tablet, Take 10 mg by mouth daily., Disp: 90 tablet, Rfl: 3 .  ezetimibe (ZETIA) 10 MG tablet, Take 1 tablet (10 mg total) by mouth daily., Disp: 30 tablet, Rfl: 11 .  famotidine (PEPCID) 40 MG tablet, Take 1 tablet (40 mg total) by mouth at bedtime., Disp: 90 tablet, Rfl: 0 .  fenofibrate 160 MG tablet, Take 1 tablet (160 mg total) by mouth daily., Disp: 60 tablet, Rfl: 2 .  furosemide (LASIX) 80 MG tablet, Take 1 tablet (80 mg total) by mouth 3 (three) times a week. Take 1 tab 3-4 times weekly, Disp: 30 tablet, Rfl: 5 .  glucose blood test strip, Use as instructed, Disp: 100 each, Rfl:  12 .  Insulin Glargine (LANTUS SOLOSTAR) 100 UNIT/ML Solostar Pen, Inject 72 Units into the skin 2 (two) times daily., Disp: 5 pen, Rfl: 11 .  insulin lispro (HUMALOG) 100 UNIT/ML injection, Inject 0.38 mLs (38 Units total) into the skin 3 (three) times daily before meals., Disp: 10 mL, Rfl: 11 .  Insulin Pen Needle (PEN NEEDLES) 31G X 6 MM MISC, Use as directed, Disp: 100 each, Rfl: 6 .  Insulin Syringes, Disposable, U-100 0.5 ML MISC, Use as directed, Disp: 100 each, Rfl: 11 .  levothyroxine (SYNTHROID) 200 MCG tablet, TAKE 1 TABLET (200 MCG TOTAL) BY MOUTH DAILY. (DOSE INCREASE), Disp: 30 tablet, Rfl: 6 .  levothyroxine (SYNTHROID) 25 MCG tablet, Take 1 tablet (25 mcg total) by mouth daily before breakfast. (take in addition to the for a total of daily), Disp: 90 tablet, Rfl: 1 .  losartan (COZAAR)  25 MG tablet, Take 1 tablet (25 mg total) by mouth daily., Disp: 60 tablet, Rfl: 2 .  metFORMIN (GLUCOPHAGE-XR) 500 MG 24 hr tablet, Take 1 tablet (500 mg total) by mouth 2 (two) times daily., Disp: 60 tablet, Rfl: 2 .  methocarbamol (ROBAXIN) 500 MG tablet, Take 1 tablet (500 mg total) by mouth every 8 (eight) hours as needed for muscle spasms., Disp: 20 tablet, Rfl: 0 .  metoprolol tartrate (LOPRESSOR) 50 MG tablet, Take 1 tablet (50 mg total) by mouth 2 (two) times daily., Disp: 180 tablet, Rfl: 3 .  nitroGLYCERIN (NITROSTAT) 0.4 MG SL tablet, Place 1 tablet (0.4 mg total) under the tongue every 5 (five) minutes x 3 doses as needed for chest pain., Disp: 25 tablet, Rfl: 0 .  nystatin (MYCOSTATIN/NYSTOP) powder, Apply 1 application topically 3 (three) times daily., Disp: 180 g, Rfl: 0 .  Omega-3 Fatty Acids (FISH OIL) 1000 MG CAPS, Take 4 capsules by mouth daily., Disp: , Rfl:  .  pantoprazole (PROTONIX) 40 MG tablet, Take 1 tablet (40 mg total) by mouth daily., Disp: 90 tablet, Rfl: 3 .  PRESCRIPTION MEDICATION, CPAP- At bedtime, Disp: , Rfl:  .  rosuvastatin (CRESTOR) 40 MG tablet,  Take 1 tablet (40 mg total) by mouth daily., Disp: 30 tablet, Rfl: 11 .  sildenafil (VIAGRA) 50 MG tablet, Take 50 mg by mouth 2 (two) times a week., Disp: , Rfl:  .  TRUEplus Lancets 28G MISC, Use as directed, Disp: 100 each, Rfl: 12 .  benzonatate (TESSALON PERLES) 100 MG capsule, Take 1 capsule (100 mg total) by mouth 3 (three) times daily as needed for cough. (Patient not taking: Reported on 07/19/2019), Disp: 30 capsule, Rfl: 0 .  lidocaine (LIDODERM) 5 %, Place 1 patch onto the skin daily. Remove & Discard patch within 12 hours or as directed by MD, Disp: 30 patch, Rfl: 0

## 2019-09-15 ENCOUNTER — Telehealth: Payer: Self-pay | Admitting: Internal Medicine

## 2019-09-15 ENCOUNTER — Encounter: Payer: Self-pay | Admitting: Internal Medicine

## 2019-09-15 NOTE — Telephone Encounter (Signed)
Patient called to let his PCP know that he received his COVID vaccine.

## 2019-09-15 NOTE — Telephone Encounter (Signed)
Will forward to pcp

## 2019-09-21 ENCOUNTER — Other Ambulatory Visit: Payer: Self-pay

## 2019-10-18 ENCOUNTER — Emergency Department (HOSPITAL_BASED_OUTPATIENT_CLINIC_OR_DEPARTMENT_OTHER): Payer: Self-pay

## 2019-10-18 ENCOUNTER — Emergency Department (HOSPITAL_BASED_OUTPATIENT_CLINIC_OR_DEPARTMENT_OTHER)
Admission: EM | Admit: 2019-10-18 | Discharge: 2019-10-18 | Disposition: A | Discharge status: Home / Self Care | Payer: Self-pay | Attending: Emergency Medicine | Admitting: Emergency Medicine

## 2019-10-18 ENCOUNTER — Encounter (HOSPITAL_BASED_OUTPATIENT_CLINIC_OR_DEPARTMENT_OTHER): Payer: Self-pay

## 2019-10-18 ENCOUNTER — Other Ambulatory Visit: Payer: Self-pay

## 2019-10-18 DIAGNOSIS — M533 Sacrococcygeal disorders, not elsewhere classified: Secondary | ICD-10-CM

## 2019-10-18 DIAGNOSIS — E1169 Type 2 diabetes mellitus with other specified complication: Secondary | ICD-10-CM | POA: Insufficient documentation

## 2019-10-18 DIAGNOSIS — W19XXXA Unspecified fall, initial encounter: Secondary | ICD-10-CM

## 2019-10-18 DIAGNOSIS — Z87891 Personal history of nicotine dependence: Secondary | ICD-10-CM | POA: Insufficient documentation

## 2019-10-18 DIAGNOSIS — M545 Low back pain: Secondary | ICD-10-CM | POA: Insufficient documentation

## 2019-10-18 DIAGNOSIS — E1122 Type 2 diabetes mellitus with diabetic chronic kidney disease: Secondary | ICD-10-CM | POA: Insufficient documentation

## 2019-10-18 DIAGNOSIS — Z7982 Long term (current) use of aspirin: Secondary | ICD-10-CM | POA: Insufficient documentation

## 2019-10-18 DIAGNOSIS — I5032 Chronic diastolic (congestive) heart failure: Secondary | ICD-10-CM | POA: Insufficient documentation

## 2019-10-18 DIAGNOSIS — N189 Chronic kidney disease, unspecified: Secondary | ICD-10-CM | POA: Insufficient documentation

## 2019-10-18 DIAGNOSIS — E785 Hyperlipidemia, unspecified: Secondary | ICD-10-CM | POA: Insufficient documentation

## 2019-10-18 DIAGNOSIS — I2510 Atherosclerotic heart disease of native coronary artery without angina pectoris: Secondary | ICD-10-CM | POA: Insufficient documentation

## 2019-10-18 DIAGNOSIS — I252 Old myocardial infarction: Secondary | ICD-10-CM | POA: Insufficient documentation

## 2019-10-18 DIAGNOSIS — Z7902 Long term (current) use of antithrombotics/antiplatelets: Secondary | ICD-10-CM | POA: Insufficient documentation

## 2019-10-18 DIAGNOSIS — I130 Hypertensive heart and chronic kidney disease with heart failure and stage 1 through stage 4 chronic kidney disease, or unspecified chronic kidney disease: Secondary | ICD-10-CM | POA: Insufficient documentation

## 2019-10-18 DIAGNOSIS — E039 Hypothyroidism, unspecified: Secondary | ICD-10-CM | POA: Insufficient documentation

## 2019-10-18 DIAGNOSIS — Z79899 Other long term (current) drug therapy: Secondary | ICD-10-CM | POA: Insufficient documentation

## 2019-10-18 MED ORDER — ACETAMINOPHEN 500 MG PO TABS
1000.0000 mg | ORAL_TABLET | Freq: Once | ORAL | Status: AC
  Administered 2019-10-18: 1000 mg via ORAL
  Filled 2019-10-18: qty 2

## 2019-10-18 NOTE — ED Provider Notes (Signed)
[facility] EMERGENCY DEPARTMENT Provider Note   CSN: [id number] Arrival date & time: 10/18/19  1204     History Chief Complaint  Patient presents with   Marc Walker is a 49 y.o. male presenting for evaluation of coccyx pain.  Patient states yesterday he was getting into the pool when he misjudged the ledge, and hit his buttock/tailbone against the edge of the pool.  Since then, he has had persistent pain of his coccyx.  It does not radiate anywhere.  Has not taken anything for pain including Tylenol ibuprofen.  He states it feels different from his chronic back pain.  He denies numbness, tingling, loss of bowel bladder control.  He is able to ambulate with difficulty.  He has minimal to no pain when laying flat. He is not on blood thinners.   HPI     Past Medical History:  Diagnosis Date   Chronic diastolic CHF (congestive heart failure) ([place]) 03/09/2017   Echo 1/18: Mild LVH, EF 60-65, Gr 2 DD, mild LAE; difficult study-no obvious wall motion abnormalities with Definity   CKD (chronic kidney disease)    Coronary artery disease    a. s/pprior myocardial infarction, s/p multipleprior stents placedat outside institutions with last intervention 08/2015 at [facility].   Diabetes mellitus ([place])    Drug addiction in remission [facility])    Erectile dysfunction 03/02/2017   Hyperlipidemia associated with type 2 diabetes mellitus ([place])    Hypertension    Hypothyroidism    a. adm with profound hypothyroidism near myedema in 04/2017.   MI (myocardial infarction) ([place]) 07/2009   [facility] in [place], [place] for last 2, 1st one in [place]; total of 6 stents, last one 02/2014    Morbid obesity ([place]) 07/06/2016   Noncompliance with therapeutic plan    a. thyroid med -> profound hypothyroidism 04/2017.   OSA (obstructive sleep apnea) 07/06/2016   Moderate with AHI 21.8/hr by PSG 08/2012 now on CPAP   Prolonged QT interval 05/14/2017   Tobacco abuse     Wide-complex tachycardia ([place]) 05/14/2017    Patient Active Problem List   Diagnosis Date Noted   COVID-19 virus infection 06/18/2019   ACS (acute coronary syndrome) ([place]) 03/25/2019   NSTEMI (non-ST elevated myocardial infarction) ([place]) 03/25/2019   Primary osteoarthritis of both hips 02/16/2019   Dyspnea on exertion 12/04/2018   CAD S/P multiple PCI's. patient reported 9 previous stents; 9/20 DES to RPAV-1 and DES to mid RCA -1; 3/17 DES to LAD and DES to mid RCA along with Angiosculpt balloon angioplas 08/09/2017   Prolonged QT interval 05/14/2017   Wide-complex tachycardia ([place]) 05/14/2017   GERD (gastroesophageal reflux disease) 05/13/2017   Hypothyroidism    Bilateral carpal tunnel syndrome 04/08/2017   Chronic diastolic CHF (congestive heart failure) ([place]) 03/09/2017   Erectile dysfunction 03/02/2017   OSA on CPAP 07/06/2016   Morbid obesity ([place]) 07/06/2016   DM type 2 with diabetic peripheral neuropathy ([place]) 12/16/2015   CAD (coronary artery disease) 09/30/2015   Essential hypertension 09/16/2015   Hyperlipidemia     Past Surgical History:  Procedure Laterality Date   APPENDECTOMY     CARDIAC CATHETERIZATION N/A 09/16/2015   Procedure: Left Heart Cath and Coronary Angiography;  Surgeon: Lorretta Harp, MD;  Location: [facility] CV LAB;  Service: Cardiovascular;  Laterality: N/A;   CARDIAC CATHETERIZATION N/A 09/16/2015   Procedure: Coronary Stent Intervention;  Surgeon: Lorretta Harp, MD;  Location: [facility]  LAB;  Service: Cardiovascular;  Laterality: N/A;   CORONARY STENT INTERVENTION N/A 03/27/2019   Procedure: CORONARY STENT INTERVENTION;  Surgeon: Corky Crafts, MD;  Location: [facility] INVASIVE CV LAB;  Service: Cardiovascular;  Laterality: N/A;   CORONARY STENT PLACEMENT     LEFT HEART CATH AND CORONARY ANGIOGRAPHY N/A 03/27/2019   Procedure: LEFT HEART CATH AND CORONARY ANGIOGRAPHY;  Surgeon: Corky Crafts, MD;  Location:  [facility] INVASIVE CV LAB;  Service: Cardiovascular;  Laterality: N/A;       Family History  Adopted: Yes  Problem Relation Age of Onset   Diabetes Maternal Grandmother     Social History   Tobacco Use   Smoking status: Former Smoker    Types: Cigarettes    Quit date: 01/31/2017    Years since quitting: 2.7   Smokeless tobacco: Never Used   Tobacco comment: quit 01/2017  Substance Use Topics   Alcohol use: No    Alcohol/week: 0.0 standard drinks   Drug use: No    Home Medications Prior to Admission medications   Medication Sig Start Date End Date Taking? Authorizing Provider  albuterol (VENTOLIN HFA) 108 (90 Base) MCG/ACT inhaler Inhale 2 puffs into the lungs every 6 (six) hours as needed for wheezing or shortness of breath. 06/18/19   Marcine Matar, MD  aspirin 81 MG tablet Take 1 tablet (81 mg total) by mouth daily. 11/17/16   Langeland, Kathaleen Grinder, MD  benzonatate (TESSALON PERLES) 100 MG capsule Take 1 capsule (100 mg total) by mouth 3 (three) times daily as needed for cough. Patient not taking: Reported on 07/19/2019 06/18/19   Marcine Matar, MD  clopidogrel (PLAVIX) 75 MG tablet Take 1 tablet (75 mg total) by mouth daily. 05/29/19   Pricilla Riffle, MD  diclofenac Sodium (VOLTAREN) 1 % GEL Apply 2 g topically 4 (four) times daily as needed. 08/08/19   Long, Arlyss Repress, MD  empagliflozin (JARDIANCE) 10 MG TABS tablet Take 10 mg by mouth daily. 07/19/19   Anders Simmonds, PA-C  ezetimibe (ZETIA) 10 MG tablet Take 1 tablet (10 mg total) by mouth daily. 07/19/19   Anders Simmonds, PA-C  famotidine (PEPCID) 40 MG tablet Take 1 tablet (40 mg total) by mouth at bedtime. 10/21/18   [name] T, PA-C  fenofibrate 160 MG tablet Take 1 tablet (160 mg total) by mouth daily. 03/30/19   Filbert Schilder, NP  furosemide (LASIX) 80 MG tablet Take 1 tablet (80 mg total) by mouth 3 (three) times a week. Take 1 tab 3-4 times weekly 07/19/19   Anders Simmonds, PA-C  glucose blood test strip  Use as instructed 05/09/18   Marcine Matar, MD  Insulin Glargine (LANTUS SOLOSTAR) 100 UNIT/ML Solostar Pen Inject 72 Units into the skin 2 (two) times daily. 07/19/19   Anders Simmonds, PA-C  insulin lispro (HUMALOG) 100 UNIT/ML injection Inject 0.38 mLs (38 Units total) into the skin 3 (three) times daily before meals. 07/19/19   Anders Simmonds, PA-C  Insulin Pen Needle (PEN NEEDLES) 31G X 6 MM MISC Use as directed 07/19/19   Anders Simmonds, PA-C  Insulin Syringes, Disposable, U-100 0.5 ML MISC Use as directed 06/11/17   Marcine Matar, MD  levothyroxine (SYNTHROID) 200 MCG tablet TAKE 1 TABLET (200 MCG TOTAL) BY MOUTH DAILY. (DOSE INCREASE) 07/19/19   Anders Simmonds, PA-C  levothyroxine (SYNTHROID) 25 MCG tablet Take 1 tablet (25 mcg total) by mouth daily before breakfast. (take in  addition to the for a total of daily) 07/20/19   Sharon Seller, Marzella Schlein, PA-C  lidocaine (LIDODERM) 5 % Place 1 patch onto the skin daily. Remove & Discard patch within 12 hours or as directed by MD 06/26/19   Couture, Cortni S, PA-C  losartan (COZAAR) 25 MG tablet Take 1 tablet (25 mg total) by mouth daily. 07/19/19   Anders Simmonds, PA-C  metFORMIN (GLUCOPHAGE-XR) 500 MG 24 hr tablet Take 1 tablet (500 mg total) by mouth 2 (two) times daily. 07/19/19   Anders Simmonds, PA-C  methocarbamol (ROBAXIN) 500 MG tablet Take 1 tablet (500 mg total) by mouth every 8 (eight) hours as needed for muscle spasms. 08/08/19   Long, Arlyss Repress, MD  metoprolol tartrate (LOPRESSOR) 50 MG tablet Take 1 tablet (50 mg total) by mouth 2 (two) times daily. 07/19/19   Anders Simmonds, PA-C  nitroGLYCERIN (NITROSTAT) 0.4 MG SL tablet Place 1 tablet (0.4 mg total) under the tongue every 5 (five) minutes x 3 doses as needed for chest pain. 03/29/19   Filbert Schilder, NP  Omega-3 Fatty Acids (FISH OIL) 1000 MG CAPS Take 4 capsules by mouth daily.    [provider]  pantoprazole (PROTONIX) 40 MG tablet Take 1 tablet  (40 mg total) by mouth daily. 02/16/19   Marcine Matar, MD  PRESCRIPTION MEDICATION CPAP- At bedtime    [provider]  rosuvastatin (CRESTOR) 40 MG tablet Take 1 tablet (40 mg total) by mouth daily. 07/19/19 07/18/20  Anders Simmonds, PA-C  sildenafil (VIAGRA) 50 MG tablet Take 50 mg by mouth 2 (two) times a week.    [provider]  TRUEplus Lancets 28G MISC Use as directed 12/08/18   Marcine Matar, MD    Allergies    Other, Levofloxacin, and Nsaids  Review of Systems   Review of Systems  Musculoskeletal: Positive for back pain.  Neurological: Negative for numbness.    Physical Exam Updated Vital Signs BP (!) 152/103 (BP Location: Left Arm)    Pulse 87    Temp 98 F (36.7 C) (Oral)    Resp 20    Ht 5\' 5"  (1.651 m)    Wt 133.4 kg    SpO2 97%    BMI 48.92 kg/m   Physical Exam Vitals and nursing note reviewed.  Constitutional:      General: He is not in acute distress.    Appearance: He is well-developed.     Comments: Appears nontoxic  HENT:     Head: Normocephalic and atraumatic.  Pulmonary:     Effort: Pulmonary effort is normal.  Abdominal:     General: There is no distension.  Musculoskeletal:        General: Tenderness present. Normal range of motion.     Cervical back: Normal range of motion.     Comments: ttp of midline coccyx. No ttp elsewhere in the back. No saddle paresthesias.  Pedal pulses 2+ bilaterally.  Strength and sensation of lower ext equal bilaterally.   Skin:    General: Skin is warm.     Capillary Refill: Capillary refill takes less than 2 seconds.     Findings: No rash.  Neurological:     Mental Status: He is alert and oriented to person, place, and time.     ED Results / Procedures / Treatments   Labs (all labs ordered are listed, but only abnormal results are displayed) Labs Reviewed - No data to display  EKG None  Radiology DG Sacrum/Coccyx  Result Date: 10/18/2019 CLINICAL DATA:  Trauma and pain. EXAM:  SACRUM AND COCCYX - 2+ VIEW COMPARISON:  06/18/2019 lumbar spine radiographs. FINDINGS: Sacroiliac joints are symmetric. No fracture about the sacrum or coccyx. IMPRESSION: No acute osseous abnormality. Electronically Signed   By: Jeronimo Greaves M.D.   On: 10/18/2019 13:56    Procedures Procedures (including critical care time)  Medications Ordered in ED Medications  acetaminophen (TYLENOL) tablet 1,000 mg (1,000 mg Oral Given 10/18/19 1452)    ED Course  I have reviewed the triage vital signs and the nursing notes.  Pertinent labs & imaging results that were available during my care of the patient were reviewed by me and considered in my medical decision making (see chart for details).    MDM Rules/Calculators/A&P                      Patient presenting for evaluation of tailbone pain after an injury yesterday.  On exam, patient is nontoxic.  He is neurovascularly intact.  Exam is not consistent with cauda equina syndrome.  Will obtain x-rays to rule out fracture.  X-ray viewed interpreted by me, no fracture or dislocation.  Discussed findings with patient.  Discussed symptomatic treatment with Tylenol.  Offered muscle relaxer, patient declined.  Offered pain medication, pt denclined.  Encouraged him to follow-up with his primary care doctor as needed.  At this time, patient appears safe for discharge.  Return precautions given.  Patient states he understands and agrees to plan.  Final Clinical Impression(s) / ED Diagnoses Final diagnoses:  Sacral pain  Fall, initial encounter    Rx / DC Orders ED Discharge Orders    None       Alveria Apley, PA-C 10/18/19 Dolores Frame, MD 10/19/19 [phone]

## 2019-10-18 NOTE — ED Triage Notes (Signed)
Pt states he was seated in a pool-kicked legs out and hit the edge of pool-pain to "tailbone"-NAD-slow gait with own cane

## 2019-10-18 NOTE — Discharge Instructions (Addendum)
Continue taking home medication as prescribed. Take acetaminophen, 1000 mg, 3 times a day. Use muscle creams or patches such as Salonpas, icy hot, BenGay, Biofreeze to help with pain. Use heat or ice as needed for pain control. Follow-up with your primary care doctor if your symptoms not proving. Return to the emergency room if you develop loss of bowel bladder control, numbness, severe worsening back pain, or any new, worsening, or concerning symptoms.

## 2019-10-19 ENCOUNTER — Ambulatory Visit: Payer: Self-pay | Admitting: Endocrinology

## 2019-10-30 ENCOUNTER — Other Ambulatory Visit: Payer: Self-pay

## 2019-11-02 ENCOUNTER — Ambulatory Visit: Payer: Self-pay | Attending: Family | Admitting: Family

## 2019-11-02 ENCOUNTER — Encounter: Payer: Self-pay | Admitting: Family

## 2019-11-02 ENCOUNTER — Other Ambulatory Visit: Payer: Self-pay

## 2019-11-02 ENCOUNTER — Other Ambulatory Visit: Payer: Self-pay | Admitting: *Deleted

## 2019-11-02 VITALS — BP 164/107 | HR 80 | Temp 95.7°F | Resp 16 | Wt 293.8 lb

## 2019-11-02 DIAGNOSIS — E782 Mixed hyperlipidemia: Secondary | ICD-10-CM

## 2019-11-02 DIAGNOSIS — R29810 Facial weakness: Secondary | ICD-10-CM

## 2019-11-02 DIAGNOSIS — M533 Sacrococcygeal disorders, not elsewhere classified: Secondary | ICD-10-CM

## 2019-11-02 LAB — LIPID PANEL
Chol/HDL Ratio: <472 ratio — ABNORMAL HIGH (ref 0.0–5.0)
Cholesterol, Total: 1416 mg/dL (ref 100–199)
HDL: <3 mg/dL — ABNORMAL LOW
Triglycerides: 9508 mg/dL (ref 0–149)

## 2019-11-02 LAB — LDL CHOLESTEROL, DIRECT: LDL Direct: 143 mg/dL — ABNORMAL HIGH (ref 0–99)

## 2019-11-02 MED ORDER — TRAMADOL HCL 50 MG PO TABS: 50.0000 mg | ORAL_TABLET | Freq: Every day | ORAL | 0 refills | Status: AC

## 2019-11-02 MED FILL — traMADol HCL 50 MG TABS: 50 | 5 days supply | Qty: 5 | Fill #0

## 2019-11-02 NOTE — Progress Notes (Signed)
Patient ID: Marc Walker, male    DOB: 1970-10-05  MRN: [id number]  CC: Hospitalization Follow-up (ED )   Subjective: Marc Walker is a 49 y.o. male with history of essential hypertension, CAD, chronic diastolic CHF, wide-complex tachycardia, ACS, NSTEMI, GERD, DM type 2 with diabetic peripheral neuropathy, hypothyroidism, bilateral carpal tunnel syndrome, morbid obesity, prolonged QT interval, Covid-19 virus infection who presents for hospital follow-up.  1. HOSPITALIZATION SACRAL PAIN FOLLOW-UP: Rates pain as 10/10 which comes and goes. Reports when sitting in a chair he has to prop on the armrest of the chair without his buttocks touching the seat to get relief as it is difficult to sit down. Has tried sitting on a pillow which does not help. Reports Tylenol does not help. Reports he was concerned about taking a muscle relaxer and or pain medication during recent hospitalization as he has a past history of substance abuse. Today he has reconsidered and now requesting pain medication to help with night pain so that he will be able to sleep reporting that he is awakened from sleep at least every hour related to poor pain control. States he will continue to use Tylenol during the day for pain management. Reports since the fall he cant feel the pinky toe on his left foot but all other sensation normal on bilateral feet. Reports he constantly feels like he has to have a bowel movement related to sacral pain.  Last visit 4/21/20210 with Dr. Stark Jock at the [facility] Emergency Department. During that encounter patient presented for evaluation of tailbone pain after an injury. Patient was non-toxic, neurovascularly intact, and exam not consistent with cauda equina syndrome. X-rays were obtained to rule out fracture. X-ray resulted no fracture or dislocation. Patient instructed to use Tylenol for symptomatic treatment. Patient was offered muscle relaxer, patient decline. Patient offered pain  medication, patient declined. Patient was instructed to follow-up with his primary care doctor as needed. Patient was safe for discharge. Return precautions were given.   2. RIGHT FACE DROOP: Reports right upper lip droop and no right nostril movement with attempt to move which began 2 days ago. States his symptoms have been about the same since then. Reports he can wiggle his right ear and raise/lower his right eyebrow. Denies change in vision, hearing, and sensation of right side of face. Denies chest pain, shoulder pain, heart palpitations, and shortness of breath. Denies pain and sensation of numbness and tingling. Reports he can taste and smell as normal. Denies drooling when eating or drinking. Reports he is able to function as normal. States he looked his symptoms up on Google and it said that he may have either multiple sclerosis, Bell's Palsy, or having a stroke.   Patient Active Problem List   Diagnosis Date Noted  . COVID-19 virus infection 06/18/2019  . ACS (acute coronary syndrome) ([place]) 03/25/2019  . NSTEMI (non-ST elevated myocardial infarction) ([place]) 03/25/2019  . Primary osteoarthritis of both hips 02/16/2019  . Dyspnea on exertion 12/04/2018  . CAD S/P multiple PCI's. patient reported 9 previous stents; 9/20 DES to RPAV-1 and DES to mid RCA -1; 3/17 DES to LAD and DES to mid RCA along with Angiosculpt balloon angioplas 08/09/2017  . Prolonged QT interval 05/14/2017  . Wide-complex tachycardia ([place]) 05/14/2017  . GERD (gastroesophageal reflux disease) 05/13/2017  . Hypothyroidism   . Bilateral carpal tunnel syndrome 04/08/2017  . Chronic diastolic CHF (congestive heart failure) ([place]) 03/09/2017  . Erectile dysfunction 03/02/2017  . OSA on CPAP 07/06/2016  .  Morbid obesity ([facility]) 07/06/2016  . DM type 2 with diabetic peripheral neuropathy ([facility]) 12/16/2015  . CAD (coronary artery disease) 09/30/2015  . Essential hypertension 09/16/2015  . Hyperlipidemia      Current  Outpatient Medications on File Prior to Visit  Medication Sig Dispense Refill  . albuterol (VENTOLIN HFA) 108 (90 Base) MCG/ACT inhaler Inhale 2 puffs into the lungs every 6 (six) hours as needed for wheezing or shortness of breath. 6.7 g 0  . aspirin 81 MG tablet Take 1 tablet (81 mg total) by mouth daily. 90 tablet 3  . benzonatate (TESSALON PERLES) 100 MG capsule Take 1 capsule (100 mg total) by mouth 3 (three) times daily as needed for cough. (Patient not taking: Reported on 07/19/2019) 30 capsule 0  . clopidogrel (PLAVIX) 75 MG tablet Take 1 tablet (75 mg total) by mouth daily. 90 tablet 0  . diclofenac Sodium (VOLTAREN) 1 % GEL Apply 2 g topically 4 (four) times daily as needed. 50 g 0  . empagliflozin (JARDIANCE) 10 MG TABS tablet Take 10 mg by mouth daily. 90 tablet 3  . ezetimibe (ZETIA) 10 MG tablet Take 1 tablet (10 mg total) by mouth daily. 30 tablet 11  . famotidine (PEPCID) 40 MG tablet Take 1 tablet (40 mg total) by mouth at bedtime. 90 tablet 0  . fenofibrate 160 MG tablet Take 1 tablet (160 mg total) by mouth daily. 60 tablet 2  . furosemide (LASIX) 80 MG tablet Take 1 tablet (80 mg total) by mouth 3 (three) times a week. Take 1 tab 3-4 times weekly 30 tablet 5  . glucose blood test strip Use as instructed 100 each 12  . Insulin Glargine (LANTUS SOLOSTAR) 100 UNIT/ML Solostar Pen Inject 72 Units into the skin 2 (two) times daily. 5 pen 11  . insulin lispro (HUMALOG) 100 UNIT/ML injection Inject 0.38 mLs (38 Units total) into the skin 3 (three) times daily before meals. 10 mL 11  . Insulin Pen Needle (PEN NEEDLES) 31G X 6 MM MISC Use as directed 100 each 6  . Insulin Syringes, Disposable, U-100 0.5 ML MISC Use as directed 100 each 11  . levothyroxine (SYNTHROID) 200 MCG tablet TAKE 1 TABLET (200 MCG TOTAL) BY MOUTH DAILY. (DOSE INCREASE) 30 tablet 6  . levothyroxine (SYNTHROID) 25 MCG tablet Take 1 tablet (25 mcg total) by mouth daily before breakfast. (take in addition to the  for a total of daily) 90 tablet 1  . lidocaine (LIDODERM) 5 % Place 1 patch onto the skin daily. Remove & Discard patch within 12 hours or as directed by MD 30 patch 0  . losartan (COZAAR) 25 MG tablet Take 1 tablet (25 mg total) by mouth daily. 60 tablet 2  . metFORMIN (GLUCOPHAGE-XR) 500 MG 24 hr tablet Take 1 tablet (500 mg total) by mouth 2 (two) times daily. 60 tablet 2  . methocarbamol (ROBAXIN) 500 MG tablet Take 1 tablet (500 mg total) by mouth every 8 (eight) hours as needed for muscle spasms. 20 tablet 0  . metoprolol tartrate (LOPRESSOR) 50 MG tablet Take 1 tablet (50 mg total) by mouth 2 (two) times daily. 180 tablet 3  . nitroGLYCERIN (NITROSTAT) 0.4 MG SL tablet Place 1 tablet (0.4 mg total) under the tongue every 5 (five) minutes x 3 doses as needed for chest pain. 25 tablet 0  . Omega-3 Fatty Acids (FISH OIL) 1000 MG CAPS Take 4 capsules by mouth daily.    . pantoprazole (PROTONIX) 40 MG  tablet Take 1 tablet (40 mg total) by mouth daily. 90 tablet 3  . PRESCRIPTION MEDICATION CPAP- At bedtime    . rosuvastatin (CRESTOR) 40 MG tablet Take 1 tablet (40 mg total) by mouth daily. 30 tablet 11  . sildenafil (VIAGRA) 50 MG tablet Take 50 mg by mouth 2 (two) times a week.    . TRUEplus Lancets 28G MISC Use as directed 100 each 12   No current facility-administered medications on file prior to visit.    Allergies  Allergen Reactions  . 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  Pt only wants pain medicine if absolutely necessary.  . Levofloxacin Rash    Rash on back (not sun exposed)and hypersensitivity to skin on exposed skin/arm  . Nsaids Other (See Comments)    Avoid due to CKD    Social History   Socioeconomic History  . Marital status: Divorced    Spouse name: Not on file  . Number of children: Not on file  . Years of education: Not on file  . Highest education level: Not on file  Occupational History  . Occupation:  Company secretary  Tobacco Use  . Smoking status: Former Smoker    Types: Cigarettes    Quit date: 01/31/2017    Years since quitting: 2.7  . Smokeless tobacco: Never Used  . Tobacco comment: quit 01/2017  Substance and Sexual Activity  . Alcohol use: No    Alcohol/week: 0.0 standard drinks  . Drug use: No  . Sexual activity: Not on file  Other Topics Concern  . Not on file  Social History Narrative   Adopted. Very little info on biological parents, ?diabetes.   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 Difficulty of Paying Living Expenses:   Food Insecurity:   . Worried About Programme researcher, broadcasting/film/video in the Last Year:   . Barista in the Last Year:   Transportation Needs:   . Freight forwarder (Medical):   Marland Kitchen Lack of Transportation (Non-Medical):   Physical Activity:   . Days of Exercise per Week:   . Minutes of Exercise per Session:   Stress:   . Feeling of Stress :   Social Connections:   . Frequency of Communication with Friends and Family:   . Frequency of Social Gatherings with Friends and Family:   . Attends Religious Services:   . Active Member of Clubs or Organizations:   . Attends Banker Meetings:   Marland Kitchen Marital Status:   Intimate Partner Violence:   . Fear of Current or Ex-Partner:   . Emotionally Abused:   Marland Kitchen Physically Abused:   . Sexually Abused:     Family History  Adopted: Yes  Problem Relation Age of Onset  . Diabetes Maternal Grandmother     Past Surgical History:  Procedure Laterality Date  . APPENDECTOMY    . CARDIAC CATHETERIZATION N/A 09/16/2015   Procedure: Left Heart Cath and Coronary Angiography;  Surgeon: Runell Gess, MD;  Location: [facility] INVASIVE CV LAB;  Service: Cardiovascular;  Laterality: N/A;  . CARDIAC CATHETERIZATION N/A 09/16/2015   Procedure: Coronary Stent Intervention;  Surgeon: Runell Gess, MD;  Location: [facility] INVASIVE CV LAB;  Service:  Cardiovascular;  Laterality: N/A;  . CORONARY STENT INTERVENTION N/A 03/27/2019   Procedure: CORONARY STENT INTERVENTION;  Surgeon: Corky Crafts,  MD;  Location: [facility] INVASIVE CV LAB;  Service: Cardiovascular;  Laterality: N/A;  . CORONARY STENT PLACEMENT    . LEFT HEART CATH AND CORONARY ANGIOGRAPHY N/A 03/27/2019   Procedure: LEFT HEART CATH AND CORONARY ANGIOGRAPHY;  Surgeon: Corky Crafts, MD;  Location: [facility] INVASIVE CV LAB;  Service: Cardiovascular;  Laterality: N/A;    ROS: Review of Systems Negative except as stated above  PHYSICAL EXAM: Vitals with BMI 11/02/2019 10/18/2019 08/08/2019  Height - 5\' 5"  5' 5.5"  Weight 293 lbs 13 oz 294 lbs 308 lbs  BMI 48.89 48.92 50.45  Systolic 164 152  Diastolic 107 103 83  Pulse 80 87 89  SpO2- 94%, room air Temperature- 95.7 F, oral  Physical Exam General appearance - alert, well appearing, and in no distress, oriented to person, place, and time and overweight Mental status - alert, oriented to person, place, and time, normal mood, behavior, speech, dress, motor activity, and thought processes Eyes - pupils equal and reactive, extraocular eye movements intact Ears - bilateral TM's and external ear canals normal Nose - normal and patent, no erythema, discharge or polyps and normal nontender sinuses Mouth - mucous membranes moist, pharynx normal without lesions Neck - supple, no significant adenopathy Lymphatics - no palpable lymphadenopathy, no hepatosplenomegaly Chest - clear to auscultation, no wheezes, rales or rhonchi, symmetric air entry, no tachypnea, retractions or cyanosis Heart - normal rate, regular rhythm, normal S1, S2, no murmurs, rubs, clicks or gallops Neurological - alert, oriented, normal speech, no focal findings or movement disorder noted, neck supple without rigidity, DTR's normal and symmetric, motor and sensory grossly normal bilaterally, normal muscle tone, no tremors, strength 5/5, abnormal findings: uneven  smile at right upper lip and paralysis of right nostril, patient able to open and close bilateral eyelids symmetrically and keep them closed while provider provides resistance while trying to open bilateral eyelids Musculoskeletal - no joint tenderness, deformity or swelling, no muscular tenderness noted, full range of motion without pain Extremities - peripheral pulses normal, no pedal edema, no clubbing or cyanosis  ASSESSMENT AND PLAN: 1. Sacral pain: -Patient in stable condition during today's visit. Reports his pain is about the same since his visit at the emergency department on 10/18/2019. Today presents to clinic requesting pain medication for nighttime pain management.   -Diagnostic of sacrum/coccyx on 10/18/2019 revealed no fracture or dislocation. -Continue Tylenol over-the-counter for management of pain during the daytime.  -Tramadol prescribed for nighttime pain management for a 5 day course. Counseled patient to not drive a vehicle, operate heavy or dangerous machinery, drink alcohol, or use illicit substances while consuming this medication. Patient verbalized understanding. -Follow-up with primary physician as needed. -Seek immediate medical attention if symptoms worsen or become severe. Patient verbalized understanding. - traMADol (ULTRAM) 50 MG tablet; Take 1 tablet (50 mg total) by mouth at bedtime for 5 days.  Dispense: 5 tablet; Refill: 0  2. Mouth droop: -Patient presents today with paralysis of right upper lip and right nostril with attempted movement. Today in clinic patient in stable condition without chest/shoulder pain, heart palpitations, and shortness of breath.  -Right upper lip and right nostril paralysis related to unknown eiology. On physical examination does not appear patient is having symptoms of stroke or Bell's Palsy.  -Patient should continue to monitor for persistent symptoms at home and follow-up with primary physician as needed. -Patient was given clear  instructions to go to Emergency Department or return to medical center if symptoms don't improve, worsen, or  new problems develop.The patient verbalized understanding.  Patient was given the opportunity to ask questions.  Patient verbalized understanding of the plan and was able to repeat key elements of the plan. Patient was given clear instructions to go to Emergency Department or return to medical center if symptoms don't improve, worsen, or new problems develop.The patient verbalized understanding.  Requested Prescriptions    No prescriptions requested or ordered in this encounter    Aneli Zara Jodi GeraldsJ Jay Kempe, NP

## 2019-11-02 NOTE — Patient Instructions (Addendum)
Tramadol for sacral pain. Monitor right side of face. Follow-up with primary physician as needed. Acute Pain, Adult Acute pain is a type of sudden pain that may last for just a few days or for as long as six months. It is often related to an illness, injury, or medical procedure. Acute pain may be mild, moderate, or severe. Pain can make it hard for you to do your normal, daily activities. It can cause anxiety and lead to other problems if it is left untreated. Treatment depends on the cause and severity of your pain. Acute pain usually goes away once your injury has healed or you are no longer ill. Follow these instructions at home: Medicines   Take over-the-counter and prescription medicines only as told by your health care provider.  Take the lowest dose of medicine for the shortest amount of time needed to relieve the pain.  If you are taking prescription pain medicine: ? Do not stop taking the medicine suddenly. Talk to your health care provider about how and when to discontinue prescription medicine. ? Do not take more pills than told by your health care provider even if your pain is severe. ? Do not take other over-the-counter pain medicines in addition to prescription pain medicine unless told by your health care provider. ? Ask your health care provider if the medicine requires you to avoid driving or using heavy machinery. ? Ask your health care provider if the medicine can cause constipation. You may need to take these actions to prevent or treat constipation:  Drink enough fluid to keep your urine pale yellow.  Eat foods that are high in fiber, such as beans, whole grains, and fresh fruits and vegetables.  Take over-the-counter or prescription medicines.  Limit foods that are high in fat and processed sugars, such as fried or sweet foods. Managing pain, stiffness, and swelling     If directed, put ice on the affected area. To do this:  Put ice in a plastic bag.  Place a  towel between your skin and the bag.  Leave the ice on for 20 minutes, 2-3 times a day. If directed, apply heat to the affected area as often as told by your health care provider. Use the heat source that your health care provider recommends, such as a moist heat pack or a heating pad.  Place a towel between your skin and the heat source.  Leave the heat on for 20-30 minutes.  Remove the heat if your skin turns bright red. This is especially important if you are unable to feel pain, heat, or cold. You may have a greater risk of getting burned.  Activity  Rest as told by your health care provider.  Return to your normal activities as told by your health care provider. Ask your health care provider what activities are safe for you. General instructions  Check your pain level as told by your health care provider.  Ask your health care provider if other strategies such as distraction, relaxation, or physical therapies can help your pain.  Keep all follow-up visits as told by your health care provider. This is important. Contact a health care provider if:  Your pain is not controlled by medicine.  Your pain does not improve or gets worse.  You have side effects from pain medicines, such as vomiting or confusion. Get help right away if you:  Have severe pain.  Have trouble breathing.  Lose consciousness.  Have chest pain or pressure that lasts for  more than a few minutes, or if you have other symptoms along with chest pain, including if you: ? Have pain or discomfort in one or both arms, your back, neck, jaw, or stomach. ? Have shortness of breath. ? Break out in a cold sweat. ? Feel nauseous. ? Become light-headed. These symptoms may represent a serious problem that is an emergency. Do not wait to see if the symptoms will go away. Get medical help right away. Call your local emergency services (911 in the [place] Do not drive yourself to the hospital. Summary  Acute pain may  be mild, moderate, or severe. It usually goes away once your injury has healed or you are no longer ill.  Take over-the-counter and prescription medicines only as told by your health care provider.  Ask your health care provider if the medicine prescribed to you can cause constipation.  Contact a health care provider if your pain is not controlled by medicine. This information is not intended to replace advice given to you by your health care provider. Make sure you discuss any questions you have with your health care provider. Document Revised: 10/31/2018 Document Reviewed: 10/31/2018 Elsevier Patient Education  [place].  Pain Medicine Instructions You may need pain medicine after an injury or illness. There are many types of pain medicine. Two common types of pain medicine are:  Non-opioid pain medicines. These include: ? Over-the-counter (OTC) medicines such as acetaminophen or non-steroidal anti-inflammatory medicines (NSAIDS). ? Prescription medicines such as gabapentin or pregabalin.  Opioid prescription pain medicines. These include: ? Hydrocodone. ? Oxycodone. ? Tramadol. It is important to follow instructions from your health care provider when you are taking prescription pain medicines. It is also important to take steps to keep others safe while taking pain medicine, such as avoiding certain activities and storing your medicines safely. How can pain medicine affect me? Pain medicine may be prescribed to relieve most or all of your pain. It may not be possible to make all of your pain go away, but you should be comfortable enough to move, breathe, and do normal activities. Opioid pain medicines can cause side effects, such as:  Constipation.  Nausea.  Vomiting.  Drowsiness.  Confusion.  Taking opioids for nonmedical reasons even though taking them hurts your health and well-being (opioid use disorder).  Difficulty breathing (respiratory depression). Using  opioid pain medicines for longer than 3 days increases your risk of these side effects. Taking opioid pain medicine for a long period of time can affect your ability to do daily tasks. It also puts you at risk for:  Motor vehicle accidents.  Depression.  Suicide.  Heart attack.  Taking too much of the medicine (overdose), which can sometimes lead to death. What actions can I take to lower my risk of problems? Take your medicine as directed   Take your pain medicine exactly as told by your health care provider. Take it only when you need it. ? If your pain gets less severe, you may take less than your prescribed dose if your health care provider approves. ? If you are not having pain, do not take pain medicine unless your health care provider tells you to take it.  If your pain is severe, do not try to treat it yourself by taking more pills than instructed on your prescription. Contact your health care provider for help.  Write down the times when you take your pain medicine. It is easy to become confused while on pain medicine.  Writing the time down can help you avoid overdose.  Take other over-the-counter or prescription medicines only as told by your health care provider. ? If your pain medicine contains acetaminophen, do not take any other acetaminophen while taking this medicine. Acetaminophen is found in many over-the-counter and prescription medicines. An overdose of acetaminophen can result in severe liver damage.  To prevent or treat constipation while you are taking prescription pain medicine: ? Drink enough water to keep your urine pale yellow. ? Eat more fruits and vegetables. ? Use a stool softener as recommended or prescribed by your health care provider. Restrict your activity as directed While you are taking prescription pain medicine, and for 8 hours after your last dose, follow these instructions:  Do not drive.  Do not use machinery or power tools.  Do not sign  legal documents.  Do not drink alcohol.  Do not take sleeping pills.  Do not supervise children by yourself.  Do not participate in activities that require climbing or being in high places.  Do not enter a body of water--such as a lake, river, ocean, spa, or swimming pool--unless an adult is nearby who can monitor and help you.  Keep others safe while you are taking prescription pain medicine  Keep prescription pain medicine in a locked cabinet, or in a secure area where children and pets cannot reach it.  Never share your prescription pain medicine with anyone.  Do not save any leftover pills. If you have leftover medicine, you can: ? Bring the medicine to a prescription take-back program. This is usually offered by the county or Patent examiner. ? Bring it to a pharmacy that has a drug disposal container. ? Throw it out in the trash. Check the label or package insert of your medicine to see whether this is safe to do. If it is safe to throw it out, remove the medicine from the container and mix it with material that makes it unusable (such as pet waste) before putting it in the trash. Know your pain treatment plan To manage your pain successfully, you and your health care provider need to understand each other and work together. To do this:  Discuss the goals of your treatment, including how much pain you might expect to have and how you will manage the pain.  Ask your health care provider to refer you to one or more specialists who can help you manage pain in other ways. Some other ways of managing pain include physical therapy, exercise, massage, or biofeedback.  Review the risks and benefits of taking pain medicines for your condition.  Be honest about the amount of medicines you take, and about any drug or alcohol use.  Get pain medicine prescriptions from only one health care provider.  Keep all follow-up visits as told by your health care provider. This is  important. Contact a health care provider if:  Your medicine is not relieving your pain.  You have a rash.  You feel depressed. Get help right away if: Seek medical care right away if you are taking pain medicines and you (or people close to you) notice any of the following:  Difficulty breathing.  Breathing that is slower or more shallow than normal.  Confusion.  Sleepiness or difficulty staying awake.  Nausea and vomiting.  Your skin or lips turning pale or bluish in color.  Tongue swelling. If you ever feel like you may hurt yourself or others, or have thoughts about taking your own life, get  help right away. You can go to your nearest emergency department or call:  Your local emergency services (911 in the [place]  A suicide crisis helpline, such as the National Suicide Prevention Lifeline at 617-864-6733. This is open 24 hours a day. Summary  It is important to follow instructions from your health care provider when you are taking prescription pain medicines.  Pain medicine can help reduce pain but may also cause side effects. Make sure you understand the risks and benefits of taking pain medicine for your condition.  Take steps to lower your risk of problems by taking medicine exactly as told, restricting activity, keeping others safe, preventing constipation, and having a pain treatment plan. This information is not intended to replace advice given to you by your health care provider. Make sure you discuss any questions you have with your health care provider. Document Revised: 08/06/2017 Document Reviewed: 01/25/2017 Elsevier Patient Education  2020 [employer].

## 2019-11-03 ENCOUNTER — Telehealth: Payer: Self-pay | Admitting: Pharmacist

## 2019-11-03 NOTE — Telephone Encounter (Signed)
I discussed labs with patient. TG significantly elevated. Much higher than 3 months ago. Patient denies any changes in diet.   3 months ago we asked him to take OTC fish-oil 4000mg  daily (does not have insurance so couldn't do lovaza or vascepa). However he states he cannot tolerate the capsules. Says it makes him want to throw up. He has been using chia seeds instead on his grits.  I did confrim that he is taking his rosuvastatin 40mg , fenofibrate 160mg  and zetia 10mg .  States his blood sugar has been sorta controlled. Hasn't been above 200 per his report. I don't have a recent A1C. Denies any abdominal pain. Has not drank ETOH in 29 years.  I have reached out to Dr. , does not appear he will qualify for any clinical trial.   Could try welchol- not sure how effective it will be. Would avoid Niacin due to poorly controlled blood sugars. Patient needs to limit carbs as much as possible.  Will route to Dr. for further suggestions

## 2019-11-07 ENCOUNTER — Telehealth: Payer: Self-pay | Admitting: *Deleted

## 2019-11-07 DIAGNOSIS — I2510 Atherosclerotic heart disease of native coronary artery without angina pectoris: Secondary | ICD-10-CM

## 2019-11-07 DIAGNOSIS — E785 Hyperlipidemia, unspecified: Secondary | ICD-10-CM

## 2019-11-07 DIAGNOSIS — E1142 Type 2 diabetes mellitus with diabetic polyneuropathy: Secondary | ICD-10-CM

## 2019-11-07 MED ORDER — COLESEVELAM HCL 625 MG PO TABS: 625.0000 mg | ORAL_TABLET | Freq: Two times a day (BID) | ORAL | 6 refills | Status: DC

## 2019-11-07 NOTE — Telephone Encounter (Signed)
Welchol prescription sent to pharmacy. Order placed for endocrine referral.

## 2019-11-07 NOTE — Telephone Encounter (Signed)
-----   Message from Pricilla Riffle, MD sent at 11/06/2019  1:05 PM EDT ----- Spoke to pt  Would recomm trial of Welchol 625 mg bid    F/U lipids in 4 wks  Watch carbs Also, would recomm appt in [facility] Endocrine Dr Elvera Lennox

## 2019-11-08 NOTE — Telephone Encounter (Signed)
Further research- welcohol could worsen TG due to the fact that they are already significantly elevated. I have called the patient. He has not picked up the rx yet. Told him I was going to cancel the rx. He needs to see endocrinology.  He still refuses to try fish oil capsules, suggested he freeze them. States he already tried. Niacin is risky with his increased blood sugars.

## 2019-11-09 ENCOUNTER — Other Ambulatory Visit: Payer: Self-pay | Admitting: Pharmacist

## 2019-11-20 ENCOUNTER — Telehealth: Payer: Self-pay | Admitting: Internal Medicine

## 2019-11-20 NOTE — Telephone Encounter (Signed)
Patient states during his last appointment with Dr. Tenny Craw she suggested that he sees a Insurance underwriter. He is requesting to name and phone number of the urologist that he has been referred to. Please call.

## 2019-11-20 NOTE — Telephone Encounter (Signed)
Provided patient with [facility] information where he was referred and seen in February. He has been swimming at the [facility] and is losing weight.

## 2019-11-24 ENCOUNTER — Other Ambulatory Visit: Payer: Self-pay | Admitting: *Deleted

## 2019-11-24 ENCOUNTER — Other Ambulatory Visit: Payer: Self-pay

## 2019-11-24 ENCOUNTER — Ambulatory Visit (INDEPENDENT_AMBULATORY_CARE_PROVIDER_SITE_OTHER): Payer: Self-pay | Admitting: Internal Medicine

## 2019-11-24 ENCOUNTER — Encounter: Payer: Self-pay | Admitting: Internal Medicine

## 2019-11-24 VITALS — BP 144/84 | HR 96 | Ht 65.0 in | Wt 286.0 lb

## 2019-11-24 DIAGNOSIS — I10 Essential (primary) hypertension: Secondary | ICD-10-CM

## 2019-11-24 DIAGNOSIS — E785 Hyperlipidemia, unspecified: Secondary | ICD-10-CM

## 2019-11-24 DIAGNOSIS — Z794 Long term (current) use of insulin: Secondary | ICD-10-CM

## 2019-11-24 DIAGNOSIS — E1165 Type 2 diabetes mellitus with hyperglycemia: Secondary | ICD-10-CM

## 2019-11-24 DIAGNOSIS — E1142 Type 2 diabetes mellitus with diabetic polyneuropathy: Secondary | ICD-10-CM

## 2019-11-24 DIAGNOSIS — I2510 Atherosclerotic heart disease of native coronary artery without angina pectoris: Secondary | ICD-10-CM

## 2019-11-24 MED ORDER — METOPROLOL TARTRATE 50 MG PO TABS: 50.0000 mg | ORAL_TABLET | Freq: Two times a day (BID) | ORAL | 3 refills | Status: DC

## 2019-11-24 MED FILL — ?METOPROLOL TART 50 MG TABL: 50 | 30 days supply | Qty: 60 | Fill #0

## 2019-11-24 NOTE — Patient Instructions (Addendum)
Medication Instructions:  Your physician recommends that you continue on your current medications as directed. Please refer to the Current Medication list given to you today.  *If you need a refill on your cardiac medications before your next appointment, please call your pharmacy*  Lab Work: If you have labs (blood work) drawn today and your tests are completely normal, you will receive your results only by: Marland Kitchen MyChart Message (if you have MyChart) OR . A paper copy in the mail If you have any lab test that is abnormal or we need to change your treatment, we will call you to review the results.  Follow-Up: At [facility], you and your health needs are our priority.  As part of our continuing mission to provide you with exceptional heart care, we have created designated Provider Care Teams.  These Care Teams include your primary Cardiologist (physician) and Advanced Practice Providers (APPs -  Physician Assistants and Nurse Practitioners) who all work together to provide you with the care you need, when you need it.  We recommend signing up for the patient portal called "MyChart".  Sign up information is provided on this After Visit Summary.  MyChart is used to connect with patients for Virtual Visits (Telemedicine).  Patients are able to view lab/test results, encounter notes, upcoming appointments, etc.  Non-urgent messages can be sent to your provider as well.   To learn more about what you can do with MyChart, go to [email or web address].    Your next appointment:   6 month(s)  The format for your next appointment:   In Person  Provider:   You may see Dietrich Pates, MD or one of the following Advanced Practice Providers on your designated Care Team:    [name], PA-C  Vin [place], PA-C  You have been referred to Endocrinology for diabetes and hyperlipidemia.

## 2019-11-24 NOTE — Progress Notes (Signed)
Cardiology Office Note    Date:  11/24/2019  ID:  Marc Walker, DOB 07-May-1971, MRN [id number] PCP:  Ladell Pier, MD  Cardiologist:  Dr. Jillyn Ledger ([facility])   Pt presents for f/u of CAD and edema  History of Present Illness:  Marc Walker is a 49 y.o. male with history of KM, HTN, HL, tob abuse, Chronic diastolc CHF, OSA,  CAD s/pprior myocardial infarction, s/p multipleprior stents  with last intervention 08/2015 (90% prox LAD; 70% mid LAD_(in stent:, 90% D2; 705 mid Lc; 80% mid RCA; OM2 stent patent. Last nuclear stress test in 9/17 demonstrated mild ischemia in the distal OM or PLA branch territory, felt to be low risk. Last echo 06/2016 was technically limited, EF 60-65%, grade 2 DD.  Pt found to be myxodemaous last year Had stopped thyroid meds   Admitted WCT during diuresis  QT prolonging agents stopped    In September 2020 he was admitted with CP   R/I for MI with peak trop 4315.   Cardiac cath showed patient stents and new mid RCA 95% stenosis.  He underwent PCI/DES of that leasion as well as a PCI/DES to RPAV-1 that had an 80% stensosid   Echo showed LVEF 60 to 65%  He was sent home on Brilinta and AS   Fenofibrate increased to 160 mg    I last saw the pt in clinic in January.   Since seen he had lpids drawn showing profound elevation of triglycerides (over 9000)  The pt denies abdominal pain.   He is working on diet    Improving glucose control    Referral to endocrine in progress  The pt denies CP   Breathing is OK   No LE edema    Past Medical History:  Diagnosis Date  . Chronic diastolic CHF (congestive heart failure) ([place]) 03/09/2017   Echo 1/18: Mild LVH, EF 60-65, Gr 2 DD, mild LAE; difficult study-no obvious wall motion abnormalities with Definity  . CKD (chronic kidney disease)   . Coronary artery disease    a. s/pprior myocardial infarction, s/p multipleprior stents placedat outside institutions with last intervention 08/2015 at [facility].  . Diabetes mellitus  ([place])   . Drug addiction in remission ([place])   . Erectile dysfunction 03/02/2017  . Hyperlipidemia associated with type 2 diabetes mellitus ([place])   . Hypertension   . Hypothyroidism    a. adm with profound hypothyroidism near [facility] in 04/2017.  . MI (myocardial infarction) ([place]) 07/2009   [facility] in [place], [place] for last 2, 1st one in [place], [place]; total of 6 stents, last one 02/2014   . Morbid obesity ([place]) 07/06/2016  . Noncompliance with therapeutic plan    a. thyroid med -> profound hypothyroidism 04/2017.  Marland Kitchen OSA (obstructive sleep apnea) 07/06/2016   Moderate with AHI 21.8/hr by PSG 08/2012 now on CPAP  . Prolonged QT interval 05/14/2017  . Tobacco abuse   . Wide-complex tachycardia ([place]) 05/14/2017    Past Surgical History:  Procedure Laterality Date  . APPENDECTOMY    . CARDIAC CATHETERIZATION N/A 09/16/2015   Procedure: Left Heart Cath and Coronary Angiography;  Surgeon: Lorretta Harp, MD;  Location: [place] CV [facility];  Service: Cardiovascular;  Laterality: N/A;  . CARDIAC CATHETERIZATION N/A 09/16/2015   Procedure: Coronary Stent Intervention;  Surgeon: Lorretta Harp, MD;  Location: [place] [facility];  Service: Cardiovascular;  Laterality: N/A;  . CORONARY STENT INTERVENTION N/A 03/27/2019   Procedure: CORONARY STENT INTERVENTION;  Surgeon: Corky Crafts, MD;  Location: [facility] INVASIVE CV LAB;  Service: Cardiovascular;  Laterality: N/A;  . CORONARY STENT PLACEMENT    . LEFT HEART CATH AND CORONARY ANGIOGRAPHY N/A 03/27/2019   Procedure: LEFT HEART CATH AND CORONARY ANGIOGRAPHY;  Surgeon: Corky Crafts, MD;  Location: [facility] INVASIVE CV LAB;  Service: Cardiovascular;  Laterality: N/A;    Current Medications: Current Meds  Medication Sig  . aspirin 81 MG tablet Take 1 tablet (81 mg total) by mouth daily.  . clopidogrel (PLAVIX) 75 MG tablet Take 1 tablet (75 mg total) by mouth daily.  . diclofenac Sodium (VOLTAREN) 1 % GEL Apply 2 g topically 4 (four)  times daily as needed.  . empagliflozin (JARDIANCE) 10 MG TABS tablet Take 10 mg by mouth daily.  Marland Kitchen ezetimibe (ZETIA) 10 MG tablet Take 1 tablet (10 mg total) by mouth daily.  . famotidine (PEPCID) 40 MG tablet Take 1 tablet (40 mg total) by mouth at bedtime.  . fenofibrate 160 MG tablet Take 1 tablet (160 mg total) by mouth daily.  Marland Kitchen glucose blood test strip Use as instructed  . Insulin Glargine (LANTUS SOLOSTAR) 100 UNIT/ML Solostar Pen Inject 72 Units into the skin 2 (two) times daily.  . insulin lispro (HUMALOG) 100 UNIT/ML injection Inject 0.38 mLs (38 Units total) into the skin 3 (three) times daily before meals.  . Insulin Pen Needle (PEN NEEDLES) 31G X 6 MM MISC Use as directed  . Insulin Syringes, Disposable, U-100 0.5 ML MISC Use as directed  . levothyroxine (SYNTHROID) 200 MCG tablet TAKE 1 TABLET (200 MCG TOTAL) BY MOUTH DAILY. (DOSE INCREASE)  . levothyroxine (SYNTHROID) 25 MCG tablet Take 1 tablet (25 mcg total) by mouth daily before breakfast. (take in addition to the for a total of daily)  . losartan (COZAAR) 25 MG tablet Take 1 tablet (25 mg total) by mouth daily.  . metFORMIN (GLUCOPHAGE-XR) 500 MG 24 hr tablet Take 1 tablet (500 mg total) by mouth 2 (two) times daily.  . metoprolol tartrate (LOPRESSOR) 50 MG tablet Take 1 tablet (50 mg total) by mouth 2 (two) times daily.  . nitroGLYCERIN (NITROSTAT) 0.4 MG SL tablet Place 1 tablet (0.4 mg total) under the tongue every 5 (five) minutes x 3 doses as needed for chest pain.  . pantoprazole (PROTONIX) 40 MG tablet Take 1 tablet (40 mg total) by mouth daily.  Marland Kitchen PRESCRIPTION MEDICATION CPAP- At bedtime  . rosuvastatin (CRESTOR) 40 MG tablet Take 1 tablet (40 mg total) by mouth daily.  . sildenafil (VIAGRA) 50 MG tablet Take 50 mg by mouth 2 (two) times a week.  . TRUEplus Lancets 28G MISC Use as directed     Allergies:   Other, Levofloxacin, and Nsaids   Social History   Socioeconomic History  . Marital status:  Divorced    Spouse name: Not on file  . Number of children: Not on file  . Years of education: Not on file  . Highest education level: Not on file  Occupational History  . Occupation: Company secretary  Tobacco Use  . Smoking status: Former Smoker    Types: Cigarettes    Quit date: 01/31/2017    Years since quitting: 2.8  . Smokeless tobacco: Never Used  . Tobacco comment: quit 01/2017  Substance and Sexual Activity  . Alcohol use: No    Alcohol/week: 0.0 standard drinks  . Drug use: No  . Sexual activity: Not on file  Other Topics Concern  .  Not on file  Social History Narrative   Adopted. Very little info on biological parents, ?diabetes.   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 Difficulty of Paying Living Expenses:   Food Insecurity:   . Worried About Programme researcher, broadcasting/film/video in the Last Year:   . Barista in the Last Year:   Transportation Needs:   . Freight forwarder (Medical):   Marland Kitchen Lack of Transportation (Non-Medical):   Physical Activity:   . Days of Exercise per Week:   . Minutes of Exercise per Session:   Stress:   . Feeling of Stress :   Social Connections:   . Frequency of Communication with Friends and Family:   . Frequency of Social Gatherings with Friends and Family:   . Attends Religious Services:   . Active Member of Clubs or Organizations:   . Attends Banker Meetings:   Marland Kitchen Marital Status:      Family History:  Family History  Adopted: Yes  Problem Relation Age of Onset  . Diabetes Maternal Grandmother     ROS:   Please see the history of present illness.  All other systems are reviewed and otherwise negative.    PHYSICAL EXAM:   VS:  BP (!) 144/84   Pulse 96   Ht 5\' 5"  (1.651 m)   Wt 286 lb (129.7 kg)   SpO2 99%   BMI 47.59 kg/m   BMI: Body mass index is 47.59 kg/m. GEN: Morbidly obesed 49 yo n no acute distress  HEENT: normocephalic, atraumatic Neck: Neck  full  JVP is not elevated  Cardiac: RRR;  S1, S2   no murmurs, rubs, or gallops,  NO l LE edema  Respiratory:  clear to auscultation bilaterally, normal work of breathing GI: soft, nontender, distended, + BS Wt Readings from Last 3 Encounters:  11/24/19 286 lb (129.7 kg)  11/02/19 293 lb 12.8 oz (133.3 kg)  10/18/19 294 lb (133.4 kg)      Studies/Labs Reviewed:   EKG:  EKG was not ordered today   Recent Labs: 04/02/2019: Hemoglobin 12.3; Platelets 311 07/19/2019: BUN 25; Creatinine, Ser 0.82; Potassium 3.7; Sodium 140; TSH 11.200 08/01/2019: ALT 45   Lipid Panel    Component Value Date/Time   CHOL 1,416 (HH) 11/02/2019 0910   TRIG 9,508 (HH) 11/02/2019 0910   HDL <3 (L) 11/02/2019 0910   CHOLHDL <472.0 (H) 11/02/2019 01/02/2020   CHOLHDL 6.6 03/26/2019 0345   VLDL UNABLE TO CALCULATE IF TRIGLYCERIDE OVER 400 mg/dL 03/28/2019 56/21/3086   LDLCALC Comment (A) 11/02/2019 0910   LDLDIRECT 143 (H) 11/02/2019 0910   LDLDIRECT 76.9 03/26/2019 0345    Additional studies/ records that were reviewed today include: Summarized above.    ASSESSMENT & PLAN:   1  Chronic diastolic CHF - Volume is OK   Continue current regimen   2  CAD - NO symptoms of angina     3  Hx WCT     Avoid drugs that prolong  QT  4DM   REfer to endocrine.   Watch carbs  5  Hypothyroidism.   Continue meds   WIll need to be followed    6  HL  Profound.   03/28/2019 has reviewed.   Intolerant to fish oil   Had been using chia seeds.   Taking Crestor, fenofibrate and Zetia.   Watching carbs.    Repeat  lipids today   Disposition:  6 months     Medication Adjustments/Labs and Tests Ordered: Current medicines are reviewed at length with the patient today.  Concerns regarding medicines are outlined above. Medication changes, Labs and Tests ordered today are summarized above and listed in the Patient Instructions accessible in Encounters.   Signed, Dietrich Pates, MD  11/24/2019 3:11 PM    [facility]  [facility] [address] [place], [place], [place]  [address] Phone: [phone]; Fax: [phone]

## 2019-11-25 LAB — LIPID PANEL
Chol/HDL Ratio: 34.5 ratio — ABNORMAL HIGH (ref 0.0–5.0)
Cholesterol, Total: 586 mg/dL — ABNORMAL HIGH (ref 100–199)
HDL: 17 mg/dL — ABNORMAL LOW (ref >39)
Triglycerides: 3151 mg/dL (ref 0–149)

## 2019-11-28 ENCOUNTER — Telehealth: Payer: Self-pay | Admitting: Internal Medicine

## 2019-11-28 DIAGNOSIS — E039 Hypothyroidism, unspecified: Secondary | ICD-10-CM

## 2019-11-28 DIAGNOSIS — Z79899 Other long term (current) drug therapy: Secondary | ICD-10-CM

## 2019-11-28 NOTE — Telephone Encounter (Signed)
Reviewed records    Pt needs to have TSH, free T3, free T4 drawn

## 2019-11-29 NOTE — Addendum Note (Signed)
Addended by: Dossie Arbour on: 11/29/2019 08:58 AM   Modules accepted: Orders

## 2019-11-29 NOTE — Telephone Encounter (Signed)
I spoke with patient.  He will come to office this Friday for lab work

## 2019-12-01 ENCOUNTER — Other Ambulatory Visit: Payer: Self-pay | Admitting: *Deleted

## 2019-12-01 ENCOUNTER — Other Ambulatory Visit: Payer: Self-pay

## 2019-12-01 ENCOUNTER — Telehealth: Payer: Self-pay | Admitting: Internal Medicine

## 2019-12-01 DIAGNOSIS — Z79899 Other long term (current) drug therapy: Secondary | ICD-10-CM

## 2019-12-01 DIAGNOSIS — E039 Hypothyroidism, unspecified: Secondary | ICD-10-CM

## 2019-12-01 DIAGNOSIS — E782 Mixed hyperlipidemia: Secondary | ICD-10-CM

## 2019-12-01 NOTE — Telephone Encounter (Signed)
    Went to chart to check who called pt. Transferred to [employer]

## 2019-12-02 LAB — T4, FREE: Free T4: 0.34 ng/dL — ABNORMAL LOW (ref 0.82–1.77)

## 2019-12-02 LAB — TSH: TSH: 42 u[IU]/mL — ABNORMAL HIGH (ref 0.450–4.500)

## 2019-12-02 LAB — T3, FREE: T3, Free: 1.4 pg/mL — ABNORMAL LOW (ref 2.0–4.4)

## 2019-12-05 DIAGNOSIS — M79676 Pain in unspecified toe(s): Secondary | ICD-10-CM

## 2019-12-11 ENCOUNTER — Ambulatory Visit: Payer: Self-pay | Admitting: Internal Medicine

## 2019-12-11 ENCOUNTER — Telehealth: Payer: Self-pay | Admitting: *Deleted

## 2019-12-11 DIAGNOSIS — E039 Hypothyroidism, unspecified: Secondary | ICD-10-CM

## 2019-12-11 NOTE — Telephone Encounter (Signed)
-----   Message from Dietrich Pates V, MD sent at 12/07/2019  2:05 PM EDT ----- I would increase to 275 mcg    I missed the 200   F/U thyroid function in 10 weeks

## 2019-12-11 NOTE — Telephone Encounter (Signed)
Called patient to inform to increase 275 mcg daily. He takes on empty stomach and does not eat for about an hour afterward. I asked him to call me back after he gets home and checks his bottles. If he has 25 mcg and 200 mcg then I will send him a 50 mcg tablet.

## 2019-12-13 MED ORDER — LEVOTHYROXINE SODIUM 200 MCG PO TABS: ORAL_TABLET | ORAL | 3 refills | Status: DC

## 2019-12-13 MED ORDER — LEVOTHYROXINE SODIUM 75 MCG PO TABS: 75.0000 ug | ORAL_TABLET | Freq: Every day | ORAL | 3 refills | Status: DC

## 2019-12-13 MED FILL — LEVOTHYROXINE SODIUM 75 MCG: 75 | 30 days supply | Qty: 30 | Fill #0

## 2019-12-13 MED FILL — LEVOTHYROXINE SODIUM 200 MC: 200 | 30 days supply | Qty: 30 | Fill #0

## 2019-12-13 NOTE — Telephone Encounter (Signed)
Fill for 275 per day

## 2019-12-13 NOTE — Telephone Encounter (Signed)
Per patient MyChart message he only had 200 mcg tablets (3 tablets) of levothyroxine left.   I have sent him new prescriptions and replied to his my chart message to inform.

## 2019-12-17 ENCOUNTER — Encounter (HOSPITAL_BASED_OUTPATIENT_CLINIC_OR_DEPARTMENT_OTHER): Payer: Self-pay

## 2019-12-17 ENCOUNTER — Encounter (HOSPITAL_BASED_OUTPATIENT_CLINIC_OR_DEPARTMENT_OTHER): Payer: Self-pay | Admitting: Emergency Medicine

## 2019-12-17 ENCOUNTER — Emergency Department (HOSPITAL_BASED_OUTPATIENT_CLINIC_OR_DEPARTMENT_OTHER)
Admission: EM | Admit: 2019-12-17 | Discharge: 2019-12-17 | Disposition: A | Discharge status: Home / Self Care | Payer: Self-pay | Attending: Emergency Medicine | Admitting: Emergency Medicine

## 2019-12-17 ENCOUNTER — Other Ambulatory Visit: Payer: Self-pay

## 2019-12-17 DIAGNOSIS — Z8679 Personal history of other diseases of the circulatory system: Secondary | ICD-10-CM | POA: Insufficient documentation

## 2019-12-17 DIAGNOSIS — I130 Hypertensive heart and chronic kidney disease with heart failure and stage 1 through stage 4 chronic kidney disease, or unspecified chronic kidney disease: Secondary | ICD-10-CM | POA: Insufficient documentation

## 2019-12-17 DIAGNOSIS — Z794 Long term (current) use of insulin: Secondary | ICD-10-CM | POA: Insufficient documentation

## 2019-12-17 DIAGNOSIS — E1122 Type 2 diabetes mellitus with diabetic chronic kidney disease: Secondary | ICD-10-CM | POA: Insufficient documentation

## 2019-12-17 DIAGNOSIS — Z7901 Long term (current) use of anticoagulants: Secondary | ICD-10-CM | POA: Insufficient documentation

## 2019-12-17 DIAGNOSIS — Z7982 Long term (current) use of aspirin: Secondary | ICD-10-CM | POA: Insufficient documentation

## 2019-12-17 DIAGNOSIS — E039 Hypothyroidism, unspecified: Secondary | ICD-10-CM | POA: Insufficient documentation

## 2019-12-17 DIAGNOSIS — I5032 Chronic diastolic (congestive) heart failure: Secondary | ICD-10-CM | POA: Insufficient documentation

## 2019-12-17 DIAGNOSIS — N189 Chronic kidney disease, unspecified: Secondary | ICD-10-CM | POA: Insufficient documentation

## 2019-12-17 DIAGNOSIS — E119 Type 2 diabetes mellitus without complications: Secondary | ICD-10-CM | POA: Insufficient documentation

## 2019-12-17 DIAGNOSIS — Z79899 Other long term (current) drug therapy: Secondary | ICD-10-CM | POA: Insufficient documentation

## 2019-12-17 DIAGNOSIS — Z87891 Personal history of nicotine dependence: Secondary | ICD-10-CM | POA: Insufficient documentation

## 2019-12-17 DIAGNOSIS — I509 Heart failure, unspecified: Secondary | ICD-10-CM | POA: Insufficient documentation

## 2019-12-17 DIAGNOSIS — I2510 Atherosclerotic heart disease of native coronary artery without angina pectoris: Secondary | ICD-10-CM | POA: Insufficient documentation

## 2019-12-17 DIAGNOSIS — L03011 Cellulitis of right finger: Secondary | ICD-10-CM | POA: Insufficient documentation

## 2019-12-17 MED ORDER — SODIUM CHLORIDE 0.9 % IV SOLN
1.0000 g | Freq: Once | INTRAVENOUS | Status: AC
  Administered 2019-12-17: 1 g via INTRAVENOUS
  Filled 2019-12-17: qty 10

## 2019-12-17 MED ORDER — LIDOCAINE-EPINEPHRINE (PF) 1 %-1:200000 IJ SOLN
INTRAMUSCULAR | Status: AC
  Filled 2019-12-17: qty 30

## 2019-12-17 MED ORDER — LIDOCAINE-EPINEPHRINE (PF) 2 %-1:200000 IJ SOLN: 10.0000 mL | Freq: Once | INTRAMUSCULAR | Status: DC

## 2019-12-17 MED ORDER — CEPHALEXIN 500 MG PO CAPS: 500.0000 mg | ORAL_CAPSULE | Freq: Four times a day (QID) | ORAL | 0 refills | Status: DC

## 2019-12-17 NOTE — Discharge Instructions (Addendum)
If you develop fever, new or worsening pain, worsening redness, or any other new/concerning symptoms then call 911 or return to the ER, ideally [facility].  Otherwise, take your antibiotics as prescribed and call Dr. Greig Right office tomorrow for a follow-up on Wednesday, 6/23.  It is important to keep this appointment for a wound check.

## 2019-12-17 NOTE — ED Triage Notes (Signed)
Pt was dx this AM with a paronychia to the R middle finger. Pt has returned today because the numbing has worn off and pt feels like the finger is worse.

## 2019-12-17 NOTE — Discharge Instructions (Signed)
Use warm compresses at least 4 times a day.  Please return for rapid spreading redness or if you develop a fever.  Please call your family doctor tomorrow and see when they want to see you in the office.

## 2019-12-17 NOTE — ED Provider Notes (Signed)
[facility] EMERGENCY DEPARTMENT Provider Note   CSN: [id number] Arrival date & time: 12/17/19  1941     History Chief Complaint  Patient presents with  . Follow-up    Marc Walker is a 49 y.o. male.  HPI 49 year old male presents with right middle digit finger pain.  He developed a paronychia from nail biting and was here earlier in the day and had a digital block and incision/drainage of the paronychia.  A lot of drainage came out for him.  Since going home and the numbing wearing off he is noticing that his entire finger feels swollen and painful.  Pain seems to go beyond his finger.  He cannot bend it very well.  No fevers.    Past Medical History:  Diagnosis Date  . Chronic diastolic CHF (congestive heart failure) ([facility]) 03/09/2017   Echo 1/18: Mild LVH, EF 60-65, Gr 2 DD, mild LAE; difficult study-no obvious wall motion abnormalities with Definity  . CKD (chronic kidney disease)   . Coronary artery disease    a. s/pprior myocardial infarction, s/p multipleprior stents placedat outside institutions with last intervention 08/2015 at [facility].  . Diabetes mellitus (H[facility])   . Drug addiction in remission ([facility])   . Erectile dysfunction 03/02/2017  . Hyperlipidemia associated with type 2 diabetes mellitus ([facility])   . Hypertension   . Hypothyroidism    a. adm with profound hypothyroidism near [facility] in 04/2017.  . MI (myocardial infarction) ([facility]) 07/2009   [facility] in [place], [place] for last 2, 1st one in [place], [place]; total of 6 stents, last one 02/2014   . Morbid obesity ([facility]) 07/06/2016  . Noncompliance with therapeutic plan    a. thyroid med -> profound hypothyroidism 04/2017.  Marland Kitchen OSA (obstructive sleep apnea) 07/06/2016   Moderate with AHI 21.8/hr by PSG 08/2012 now on CPAP  . Prolonged QT interval 05/14/2017  . Tobacco abuse   . Wide-complex tachycardia (HCC) 05/14/2017    Patient Active Problem List   Diagnosis Date Noted  . COVID-19 virus infection  06/18/2019  . ACS (acute coronary syndrome) (HCC) 03/25/2019  . NSTEMI (non-ST elevated myocardial infarction) (HCC) 03/25/2019  . Primary osteoarthritis of both hips 02/16/2019  . Dyspnea on exertion 12/04/2018  . CAD S/P multiple PCI's. patient reported 9 previous stents; 9/20 DES to RPAV-1 and DES to mid RCA -1; 3/17 DES to LAD and DES to mid RCA along with Angiosculpt balloon angioplas 08/09/2017  . Prolonged QT interval 05/14/2017  . Wide-complex tachycardia (HCC) 05/14/2017  . GERD (gastroesophageal reflux disease) 05/13/2017  . Hypothyroidism   . Bilateral carpal tunnel syndrome 04/08/2017  . Chronic diastolic CHF (congestive heart failure) (HCC) 03/09/2017  . Erectile dysfunction 03/02/2017  . OSA on CPAP 07/06/2016  . Morbid obesity (HCC) 07/06/2016  . DM type 2 with diabetic peripheral neuropathy (HCC) 12/16/2015  . CAD (coronary artery disease) 09/30/2015  . Essential hypertension 09/16/2015  . Hyperlipidemia     Past Surgical History:  Procedure Laterality Date  . APPENDECTOMY    . CARDIAC CATHETERIZATION N/A 09/16/2015   Procedure: Left Heart Cath and Coronary Angiography;  Surgeon: Runell Gess, MD;  Location: [facility] INVASIVE CV LAB;  Service: Cardiovascular;  Laterality: N/A;  . CARDIAC CATHETERIZATION N/A 09/16/2015   Procedure: Coronary Stent Intervention;  Surgeon: Runell Gess, MD;  Location: [facility] INVASIVE CV LAB;  Service: Cardiovascular;  Laterality: N/A;  . CORONARY STENT INTERVENTION N/A 03/27/2019   Procedure: CORONARY STENT INTERVENTION;  Surgeon: Lance Muss  S, MD;  Location: [place] CV LAB;  Service: Cardiovascular;  Laterality: N/A;  . CORONARY STENT PLACEMENT    . LEFT HEART CATH AND CORONARY ANGIOGRAPHY N/A 03/27/2019   Procedure: LEFT HEART CATH AND CORONARY ANGIOGRAPHY;  Surgeon: Jettie Booze, MD;  Location: [place] CV LAB;  Service: Cardiovascular;  Laterality: N/A;       Family History  Adopted: Yes  Problem Relation Age of  Onset  . Diabetes Maternal Grandmother     Social History   Tobacco Use  . Smoking status: Former Smoker    Types: Cigarettes    Quit date: 01/31/2017    Years since quitting: 2.8  . Smokeless tobacco: Never Used  . Tobacco comment: quit 01/2017  Vaping Use  . Vaping Use: Never used  Substance Use Topics  . Alcohol use: No    Alcohol/week: 0.0 standard drinks  . Drug use: No    Home Medications Prior to Admission medications   Medication Sig Start Date End Date Taking? Authorizing Provider  aspirin 81 MG tablet Take 1 tablet (81 mg total) by mouth daily. 11/17/16   Maren Reamer, MD  cephALEXin (KEFLEX) 500 MG capsule Take 1 capsule (500 mg total) by mouth 4 (four) times daily. 12/17/19   Sherwood Gambler, MD  clopidogrel (PLAVIX) 75 MG tablet Take 1 tablet (75 mg total) by mouth daily. 05/29/19   Fay Records, MD  diclofenac Sodium (VOLTAREN) 1 % GEL Apply 2 g topically 4 (four) times daily as needed. 08/08/19   Long, Wonda Olds, MD  empagliflozin (JARDIANCE) 10 MG TABS tablet Take 10 mg by mouth daily. 07/19/19   Argentina Donovan, PA-C  ezetimibe (ZETIA) 10 MG tablet Take 1 tablet (10 mg total) by mouth daily. 07/19/19   Argentina Donovan, PA-C  famotidine (PEPCID) 40 MG tablet Take 1 tablet (40 mg total) by mouth at bedtime. 10/21/18   [name] T, PA-C  fenofibrate 160 MG tablet Take 1 tablet (160 mg total) by mouth daily. 03/30/19   Kathyrn Drown D, NP  glucose blood test strip Use as instructed 05/09/18   Ladell Pier, MD  Insulin Glargine (LANTUS SOLOSTAR) 100 UNIT/ML Solostar Pen Inject 72 Units into the skin 2 (two) times daily. 07/19/19   Argentina Donovan, PA-C  insulin lispro (HUMALOG) 100 UNIT/ML injection Inject 0.38 mLs (38 Units total) into the skin 3 (three) times daily before meals. 07/19/19   Argentina Donovan, PA-C  Insulin Pen Needle (PEN NEEDLES) 31G X 6 MM MISC Use as directed 07/19/19   Argentina Donovan, PA-C  Insulin Syringes, Disposable, U-100 0.5 ML  MISC Use as directed 06/11/17   Ladell Pier, MD  levothyroxine (SYNTHROID) 200 MCG tablet TAKE 1 TABLET (200 MCG TOTAL) BY MOUTH DAILY. (DOSE INCREASE) 12/13/19   Fay Records, MD  levothyroxine (SYNTHROID) 75 MCG tablet Take 1 tablet (75 mcg total) by mouth daily before breakfast. 12/13/19   Fay Records, MD  losartan (COZAAR) 25 MG tablet Take 1 tablet (25 mg total) by mouth daily. 07/19/19   Argentina Donovan, PA-C  metFORMIN (GLUCOPHAGE-XR) 500 MG 24 hr tablet Take 1 tablet (500 mg total) by mouth 2 (two) times daily. 07/19/19   Argentina Donovan, PA-C  metoprolol tartrate (LOPRESSOR) 50 MG tablet Take 1 tablet (50 mg total) by mouth 2 (two) times daily. 11/24/19   Fay Records, MD  nitroGLYCERIN (NITROSTAT) 0.4 MG SL tablet Place 1 tablet (0.4 mg total)  under the tongue every 5 (five) minutes x 3 doses as needed for chest pain. 03/29/19   Georgie Chard D, NP  pantoprazole (PROTONIX) 40 MG tablet Take 1 tablet (40 mg total) by mouth daily. 02/16/19   Marcine Matar, MD  PRESCRIPTION MEDICATION CPAP- At bedtime    [provider]  rosuvastatin (CRESTOR) 40 MG tablet Take 1 tablet (40 mg total) by mouth daily. 07/19/19 07/18/20  Anders Simmonds, PA-C  sildenafil (VIAGRA) 50 MG tablet Take 50 mg by mouth 2 (two) times a week.    [provider]  TRUEplus Lancets 28G MISC Use as directed 12/08/18   Marcine Matar, MD    Allergies    Other, Levofloxacin, and Nsaids  Review of Systems   Review of Systems  Constitutional: Negative for fever.  Musculoskeletal: Positive for arthralgias and joint swelling.  Skin: Positive for color change and wound.    Physical Exam Updated Vital Signs BP (!) 148/87 (BP Location: Left Arm)   Pulse 95   Temp 98.9 F (37.2 C) (Oral)   Resp 16   Ht 5\' 5"  (1.651 m)   Wt 127.9 kg   SpO2 94%   BMI 46.93 kg/m   Physical Exam Vitals and nursing note reviewed.  Constitutional:      Appearance: He is well-developed. He is obese.    HENT:     Head: Normocephalic and atraumatic.     Right Ear: External ear normal.     Left Ear: External ear normal.     Nose: Nose normal.  Eyes:     General:        Right eye: No discharge.        Left eye: No discharge.  Cardiovascular:     Rate and Rhythm: Normal rate and regular rhythm.     Pulses:          Radial pulses are 2+ on the right side.  Pulmonary:     Effort: Pulmonary effort is normal.     Breath sounds: Normal breath sounds.  Abdominal:     General: There is no distension.  Musculoskeletal:     Cervical back: Neck supple.     Comments: See picture. Right middle finger is diffusely swollen. Some erythema streaking up dorsal hand. Diffusely tender, cannot flex finger  Skin:    General: Skin is warm and dry.     Findings: Erythema present.  Neurological:     Mental Status: He is alert.  Psychiatric:        Mood and Affect: Mood is not anxious.       ED Results / Procedures / Treatments   Labs (all labs ordered are listed, but only abnormal results are displayed) Labs Reviewed - No data to display  EKG None  Radiology No results found.  Procedures Procedures (including critical care time)  Medications Ordered in ED Medications  cefTRIAXone (ROCEPHIN) 1 g in sodium chloride 0.9 % 100 mL IVPB (1 g Intravenous New Bag/Given 12/17/19 2034)    ED Course  I have reviewed the triage vital signs and the nursing notes.  Pertinent labs & imaging results that were available during my care of the patient were reviewed by me and considered in my medical decision making (see chart for details).    MDM Rules/Calculators/A&P                          I discussed the patient's case with  hand surgeon on-call, Dr. Eulah Pont, who was able to view the patient's picture.  He recommends a dose of IV Rocephin and then discharged on Keflex.  If at any point he gets worse he should go to the ER for repeat evaluation.  Otherwise, follow-up in his office on Wednesday,  6/23.  The patient is otherwise well-appearing.  Does not appear to need an immediate drainage as he did have his paronychia drained earlier. Final Clinical Impression(s) / ED Diagnoses Final diagnoses:  Cellulitis of finger of right hand    Rx / DC Orders ED Discharge Orders         Ordered    cephALEXin (KEFLEX) 500 MG capsule  4 times daily     Discontinue  Reprint     12/17/19 2106           Pricilla Loveless, MD 12/17/19 2110

## 2019-12-17 NOTE — ED Provider Notes (Signed)
[facility] EMERGENCY DEPARTMENT Provider Note   CSN: [id number] Arrival date & time: 12/17/19  1027     History Chief Complaint  Patient presents with  . finger pain    Marc Walker is a 49 y.o. male.  49 yo M with a chief complaints of pain to his right middle finger.  He states that he bites his fingers and sometimes has had infections along the outside of the nail.  Worse to the radial aspect of the finger.  No appreciable drainage.  Feels like the pain radiates up his arm.  No trauma.  The history is provided by the patient and the spouse.  Illness Severity:  Mild Onset quality:  Gradual Duration:  2 days Timing:  Constant Progression:  Worsening Chronicity:  New Associated symptoms: no abdominal pain, no chest pain, no congestion, no diarrhea, no fever, no headaches, no myalgias, no rash, no shortness of breath and no vomiting        Past Medical History:  Diagnosis Date  . Chronic diastolic CHF (congestive heart failure) (HCC) 03/09/2017   Echo 1/18: Mild LVH, EF 60-65, Gr 2 DD, mild LAE; difficult study-no obvious wall motion abnormalities with Definity  . CKD (chronic kidney disease)   . Coronary artery disease    a. s/pprior myocardial infarction, s/p multipleprior stents placedat outside institutions with last intervention 08/2015 at [facility].  . Diabetes mellitus (HCC)   . Drug addiction in remission (HCC)   . Erectile dysfunction 03/02/2017  . Hyperlipidemia associated with type 2 diabetes mellitus (HCC)   . Hypertension   . Hypothyroidism    a. adm with profound hypothyroidism near [facility] in 04/2017.  . MI (myocardial infarction) (HCC) 07/2009   [facility] in [place], [place] for last 2, 1st one in [place], [place]; total of 6 stents, last one 02/2014   . Morbid obesity (HCC) 07/06/2016  . Noncompliance with therapeutic plan    a. thyroid med -> profound hypothyroidism 04/2017.  Marland Kitchen OSA (obstructive sleep apnea) 07/06/2016   Moderate with AHI  21.8/hr by PSG 08/2012 now on CPAP  . Prolonged QT interval 05/14/2017  . Tobacco abuse   . Wide-complex tachycardia (HCC) 05/14/2017    Patient Active Problem List   Diagnosis Date Noted  . COVID-19 virus infection 06/18/2019  . ACS (acute coronary syndrome) (HCC) 03/25/2019  . NSTEMI (non-ST elevated myocardial infarction) (HCC) 03/25/2019  . Primary osteoarthritis of both hips 02/16/2019  . Dyspnea on exertion 12/04/2018  . CAD S/P multiple PCI's. patient reported 9 previous stents; 9/20 DES to RPAV-1 and DES to mid RCA -1; 3/17 DES to LAD and DES to mid RCA along with Angiosculpt balloon angioplas 08/09/2017  . Prolonged QT interval 05/14/2017  . Wide-complex tachycardia (HCC) 05/14/2017  . GERD (gastroesophageal reflux disease) 05/13/2017  . Hypothyroidism   . Bilateral carpal tunnel syndrome 04/08/2017  . Chronic diastolic CHF (congestive heart failure) (HCC) 03/09/2017  . Erectile dysfunction 03/02/2017  . OSA on CPAP 07/06/2016  . Morbid obesity (HCC) 07/06/2016  . DM type 2 with diabetic peripheral neuropathy (HCC) 12/16/2015  . CAD (coronary artery disease) 09/30/2015  . Essential hypertension 09/16/2015  . Hyperlipidemia     Past Surgical History:  Procedure Laterality Date  . APPENDECTOMY    . CARDIAC CATHETERIZATION N/A 09/16/2015   Procedure: Left Heart Cath and Coronary Angiography;  Surgeon: Runell Gess, MD;  Location: [facility] INVASIVE CV LAB;  Service: Cardiovascular;  Laterality: N/A;  . CARDIAC CATHETERIZATION N/A 09/16/2015  Procedure: Coronary Stent Intervention;  Surgeon: Runell Gess, MD;  Location: [facility] INVASIVE CV LAB;  Service: Cardiovascular;  Laterality: N/A;  . CORONARY STENT INTERVENTION N/A 03/27/2019   Procedure: CORONARY STENT INTERVENTION;  Surgeon: Corky Crafts, MD;  Location: [facility] INVASIVE CV LAB;  Service: Cardiovascular;  Laterality: N/A;  . CORONARY STENT PLACEMENT    . LEFT HEART CATH AND CORONARY ANGIOGRAPHY N/A 03/27/2019    Procedure: LEFT HEART CATH AND CORONARY ANGIOGRAPHY;  Surgeon: Corky Crafts, MD;  Location: [facility] INVASIVE CV LAB;  Service: Cardiovascular;  Laterality: N/A;       Family History  Adopted: Yes  Problem Relation Age of Onset  . Diabetes Maternal Grandmother     Social History   Tobacco Use  . Smoking status: Former Smoker    Types: Cigarettes    Quit date: 01/31/2017    Years since quitting: 2.8  . Smokeless tobacco: Never Used  . Tobacco comment: quit 01/2017  Vaping Use  . Vaping Use: Never used  Substance Use Topics  . Alcohol use: No    Alcohol/week: 0.0 standard drinks  . Drug use: No    Home Medications Prior to Admission medications   Medication Sig Start Date End Date Taking? Authorizing Provider  aspirin 81 MG tablet Take 1 tablet (81 mg total) by mouth daily. 11/17/16   Pete Glatter, MD  clopidogrel (PLAVIX) 75 MG tablet Take 1 tablet (75 mg total) by mouth daily. 05/29/19   Pricilla Riffle, MD  diclofenac Sodium (VOLTAREN) 1 % GEL Apply 2 g topically 4 (four) times daily as needed. 08/08/19   Long, Arlyss Repress, MD  empagliflozin (JARDIANCE) 10 MG TABS tablet Take 10 mg by mouth daily. 07/19/19   Anders Simmonds, PA-C  ezetimibe (ZETIA) 10 MG tablet Take 1 tablet (10 mg total) by mouth daily. 07/19/19   Anders Simmonds, PA-C  famotidine (PEPCID) 40 MG tablet Take 1 tablet (40 mg total) by mouth at bedtime. 10/21/18   [name] T, PA-C  fenofibrate 160 MG tablet Take 1 tablet (160 mg total) by mouth daily. 03/30/19   Georgie Chard D, NP  glucose blood test strip Use as instructed 05/09/18   Marcine Matar, MD  Insulin Glargine (LANTUS SOLOSTAR) 100 UNIT/ML Solostar Pen Inject 72 Units into the skin 2 (two) times daily. 07/19/19   Anders Simmonds, PA-C  insulin lispro (HUMALOG) 100 UNIT/ML injection Inject 0.38 mLs (38 Units total) into the skin 3 (three) times daily before meals. 07/19/19   Anders Simmonds, PA-C  Insulin Pen Needle (PEN NEEDLES) 31G X 6 MM  MISC Use as directed 07/19/19   Anders Simmonds, PA-C  Insulin Syringes, Disposable, U-100 0.5 ML MISC Use as directed 06/11/17   Marcine Matar, MD  levothyroxine (SYNTHROID) 200 MCG tablet TAKE 1 TABLET (200 MCG TOTAL) BY MOUTH DAILY. (DOSE INCREASE) 12/13/19   Pricilla Riffle, MD  levothyroxine (SYNTHROID) 75 MCG tablet Take 1 tablet (75 mcg total) by mouth daily before breakfast. 12/13/19   Pricilla Riffle, MD  losartan (COZAAR) 25 MG tablet Take 1 tablet (25 mg total) by mouth daily. 07/19/19   Anders Simmonds, PA-C  metFORMIN (GLUCOPHAGE-XR) 500 MG 24 hr tablet Take 1 tablet (500 mg total) by mouth 2 (two) times daily. 07/19/19   Anders Simmonds, PA-C  metoprolol tartrate (LOPRESSOR) 50 MG tablet Take 1 tablet (50 mg total) by mouth 2 (two) times daily. 11/24/19   Dietrich Pates  V, MD  nitroGLYCERIN (NITROSTAT) 0.4 MG SL tablet Place 1 tablet (0.4 mg total) under the tongue every 5 (five) minutes x 3 doses as needed for chest pain. 03/29/19   Georgie Chard D, NP  pantoprazole (PROTONIX) 40 MG tablet Take 1 tablet (40 mg total) by mouth daily. 02/16/19   Marcine Matar, MD  PRESCRIPTION MEDICATION CPAP- At bedtime    [provider]  rosuvastatin (CRESTOR) 40 MG tablet Take 1 tablet (40 mg total) by mouth daily. 07/19/19 07/18/20  Anders Simmonds, PA-C  sildenafil (VIAGRA) 50 MG tablet Take 50 mg by mouth 2 (two) times a week.    [provider]  TRUEplus Lancets 28G MISC Use as directed 12/08/18   Marcine Matar, MD    Allergies    Other, Levofloxacin, and Nsaids  Review of Systems   Review of Systems  Constitutional: Negative for chills and fever.  HENT: Negative for congestion and facial swelling.   Eyes: Negative for discharge and visual disturbance.  Respiratory: Negative for shortness of breath.   Cardiovascular: Negative for chest pain and palpitations.  Gastrointestinal: Negative for abdominal pain, diarrhea and vomiting.  Musculoskeletal: Negative for  arthralgias and myalgias.  Skin: Positive for color change. Negative for rash.  Neurological: Negative for tremors, syncope and headaches.  Psychiatric/Behavioral: Negative for confusion and dysphoric mood.    Physical Exam Updated Vital Signs Pulse 85   Temp 97.6 F (36.4 C) (Oral)   Resp 18   Ht 5\' 5"  (1.651 m)   Wt 127.9 kg   SpO2 96%   BMI 46.93 kg/m   Physical Exam Vitals and nursing note reviewed.  Constitutional:      Appearance: He is well-developed.  HENT:     Head: Normocephalic and atraumatic.  Eyes:     Pupils: Pupils are equal, round, and reactive to light.  Neck:     Vascular: No JVD.  Cardiovascular:     Rate and Rhythm: Normal rate and regular rhythm.     Heart sounds: No murmur heard.  No friction rub. No gallop.   Pulmonary:     Effort: No respiratory distress.     Breath sounds: No wheezing.  Abdominal:     General: There is no distension.     Tenderness: There is no guarding or rebound.  Musculoskeletal:        General: Tenderness present. Normal range of motion.     Cervical back: Normal range of motion and neck supple.     Comments: Tenderness and erythema to the lateral aspect of the right third digit.  Mild fluctuance.  No drainage.  Flexion at the PIP and DIP without issue.  No red streaking.  Skin:    Coloration: Skin is not pale.     Findings: No rash.  Neurological:     Mental Status: He is alert and oriented to person, place, and time.  Psychiatric:        Behavior: Behavior normal.     ED Results / Procedures / Treatments   Labs (all labs ordered are listed, but only abnormal results are displayed) Labs Reviewed - No data to display  EKG None  Radiology No results found.  Procedures . Incision and Drainage  Date/Time: 12/17/2019 11:08 AM Performed by: 12/19/2019, DO Authorized by: Melene Plan, DO   Consent:    Consent obtained:  Verbal   Consent given by:  Patient   Risks discussed:  Bleeding, incomplete drainage and  infection  Alternatives discussed:  No treatment, delayed treatment and alternative treatment Location:    Type:  Abscess   Location:  Upper extremity   Upper extremity location:  Finger   Finger location:  R long finger Pre-procedure details:    Skin preparation:  Chloraprep Anesthesia (see MAR for exact dosages):    Anesthesia method:  Nerve block   Block location:  Digital   Block needle gauge:  25 G   Block anesthetic:  Lidocaine 1% WITH epi   Block technique:  Digital   Block injection procedure:  Anatomic landmarks identified, anatomic landmarks palpated and negative aspiration for blood   Block outcome:  Anesthesia achieved Procedure type:    Complexity:  Complex Procedure details:    Needle aspiration: no     Incision types:  Stab incision   Incision depth:  Subcutaneous   Wound management:  Probed and deloculated   Drainage:  Purulent   Drainage amount:  Moderate   Wound treatment:  Wound left open   Packing materials:  None Post-procedure details:    Patient tolerance of procedure:  Tolerated well, no immediate complications   (including critical care time)  Medications Ordered in ED Medications  lidocaine-EPINEPHrine (XYLOCAINE W/EPI) 2 %-1:200000 (PF) injection 10 mL (has no administration in time range)  lidocaine-EPINEPHrine (XYLOCAINE-EPINEPHrine) 1 %-1:200000 (PF) injection (has no administration in time range)    ED Course  I have reviewed the triage vital signs and the nursing notes.  Pertinent labs & imaging results that were available during my care of the patient were reviewed by me and considered in my medical decision making (see chart for details).    MDM Rules/Calculators/A&P                          49 yo M with a chief complaints of paronychia.  Likely occurred due to him biting his nails.  Lance at bedside.  11:10 AM:  I have discussed the diagnosis/risks/treatment options with the patient and family and believe the pt to be eligible for  discharge home to follow-up with PCP. We also discussed returning to the ED immediately if new or worsening sx occur. We discussed the sx which are most concerning (e.g., sudden worsening pain, fever, inability to tolerate by mouth) that necessitate immediate return. Medications administered to the patient during their visit and any new prescriptions provided to the patient are listed below.  Medications given during this visit Medications  lidocaine-EPINEPHrine (XYLOCAINE W/EPI) 2 %-1:200000 (PF) injection 10 mL (has no administration in time range)  lidocaine-EPINEPHrine (XYLOCAINE-EPINEPHrine) 1 %-1:200000 (PF) injection (has no administration in time range)     The patient appears reasonably screen and/or stabilized for discharge and I doubt any other medical condition or other [facility] requiring further screening, evaluation, or treatment in the ED at this time prior to discharge.   Final Clinical Impression(s) / ED Diagnoses Final diagnoses:  Paronychia of finger of right hand    Rx / DC Orders ED Discharge Orders    None       Deno Etienne, DO 12/17/19 1110

## 2019-12-17 NOTE — ED Triage Notes (Signed)
R middle finger pain and redness around the nail. He bites his nails

## 2019-12-20 ENCOUNTER — Encounter: Payer: Self-pay | Admitting: *Deleted

## 2019-12-20 ENCOUNTER — Telehealth: Payer: Self-pay | Admitting: *Deleted

## 2019-12-20 DIAGNOSIS — Z006 Encounter for examination for normal comparison and control in clinical research program: Secondary | ICD-10-CM

## 2019-12-20 NOTE — Telephone Encounter (Signed)
LMTCB to do AEGIS visit 10 phone call.

## 2019-12-20 NOTE — Research (Signed)
AEGIS Visit 10-  Marc Walker visit 10 was done by phone call due to COVID-19 restrictions. He is doing very well other than having to be on antibiotics for an infected paronychia. He is trying to adhere to a heart healthy diet and he has joined the [facility] recently for swimming; reports 36 lbs weight loss. I commended him on this activity. The only other medication change is an increase in the dose of his thyroid medication.  Patient requests that his EOS / Visit 11 be a phone call instead of coming to the clinic; his window opens 03-26-20.                                      "CONSENT"   YES     NO   Continuing further Investigational Product and study visits for follow-up? [x]  []   Continuing consent from future biomedical research [x]  []                                      "EVENTS"    YES     NO  AE   (IF YES SEE SOURCE) []  [x]   SAE  (IF YES SEE SOURCE) []  [x]   ENDPOINT   (IF YES SEE SOURCE) []  [x]   REVASCULARIZATION  (IF YES SEE SOURCE) []  [x]   AMPUTATION   (IF YES SEE SOURCE) []  [x]   TROPONIN'S  (IF YES SEE SOURCE) []  [x]     Current Outpatient Medications:  .  aspirin 81 MG tablet, Take 1 tablet (81 mg total) by mouth daily., Disp: 90 tablet, Rfl: 3 .  cephALEXin (KEFLEX) 500 MG capsule, Take 1 capsule (500 mg total) by mouth 4 (four) times daily., Disp: 28 capsule, Rfl: 0 .  clopidogrel (PLAVIX) 75 MG tablet, Take 1 tablet (75 mg total) by mouth daily., Disp: 90 tablet, Rfl: 0 .  diclofenac Sodium (VOLTAREN) 1 % GEL, Apply 2 g topically 4 (four) times daily as needed., Disp: 50 g, Rfl: 0 .  empagliflozin (JARDIANCE) 10 MG TABS tablet, Take 10 mg by mouth daily., Disp: 90 tablet, Rfl: 3 .  ezetimibe (ZETIA) 10 MG tablet, Take 1 tablet (10 mg total) by mouth daily., Disp: 30 tablet, Rfl: 11 .  famotidine (PEPCID) 40 MG tablet, Take 1 tablet (40 mg total) by mouth at bedtime., Disp: 90 tablet, Rfl: 0 .  fenofibrate 160 MG tablet, Take 1 tablet (160 mg total) by mouth daily., Disp: 60  tablet, Rfl: 2 .  glucose blood test strip, Use as instructed, Disp: 100 each, Rfl: 12 .  Insulin Glargine (LANTUS SOLOSTAR) 100 UNIT/ML Solostar Pen, Inject 72 Units into the skin 2 (two) times daily., Disp: 5 pen, Rfl: 11 .  insulin lispro (HUMALOG) 100 UNIT/ML injection, Inject 0.38 mLs (38 Units total) into the skin 3 (three) times daily before meals., Disp: 10 mL, Rfl: 11 .  Insulin Pen Needle (PEN NEEDLES) 31G X 6 MM MISC, Use as directed, Disp: 100 each, Rfl: 6 .  Insulin Syringes, Disposable, U-100 0.5 ML MISC, Use as directed, Disp: 100 each, Rfl: 11 .  levothyroxine (SYNTHROID) 200 MCG tablet, TAKE 1 TABLET (200 MCG TOTAL) BY MOUTH DAILY. (DOSE INCREASE), Disp: 90 tablet, Rfl: 3 .  levothyroxine (SYNTHROID) 75 MCG tablet, Take 1 tablet (75 mcg total) by mouth daily before breakfast., Disp: 90 tablet, Rfl: 3 .  losartan (COZAAR) 25 MG tablet, Take 1 tablet (25 mg total) by mouth daily., Disp: 60 tablet, Rfl: 2 .  metFORMIN (GLUCOPHAGE-XR) 500 MG 24 hr tablet, Take 1 tablet (500 mg total) by mouth 2 (two) times daily., Disp: 60 tablet, Rfl: 2 .  metoprolol tartrate (LOPRESSOR) 50 MG tablet, Take 1 tablet (50 mg total) by mouth 2 (two) times daily., Disp: 180 tablet, Rfl: 3 .  nitroGLYCERIN (NITROSTAT) 0.4 MG SL tablet, Place 1 tablet (0.4 mg total) under the tongue every 5 (five) minutes x 3 doses as needed for chest pain., Disp: 25 tablet, Rfl: 0 .  pantoprazole (PROTONIX) 40 MG tablet, Take 1 tablet (40 mg total) by mouth daily., Disp: 90 tablet, Rfl: 3 .  PRESCRIPTION MEDICATION, CPAP- At bedtime, Disp: , Rfl:  .  rosuvastatin (CRESTOR) 40 MG tablet, Take 1 tablet (40 mg total) by mouth daily., Disp: 30 tablet, Rfl: 11 .  sildenafil (VIAGRA) 50 MG tablet, Take 50 mg by mouth 2 (two) times a week., Disp: , Rfl:  .  TRUEplus Lancets 28G MISC, Use as directed, Disp: 100 each, Rfl: 12

## 2020-01-06 ENCOUNTER — Encounter: Payer: Self-pay | Admitting: Internal Medicine

## 2020-01-15 ENCOUNTER — Encounter: Payer: Self-pay | Admitting: Internal Medicine

## 2020-01-15 ENCOUNTER — Other Ambulatory Visit: Payer: Self-pay

## 2020-01-15 ENCOUNTER — Ambulatory Visit: Payer: Self-pay | Attending: Internal Medicine | Admitting: Internal Medicine

## 2020-01-15 VITALS — BP 132/80 | HR 68 | Resp 16 | Wt 288.8 lb

## 2020-01-15 DIAGNOSIS — Z9989 Dependence on other enabling machines and devices: Secondary | ICD-10-CM

## 2020-01-15 DIAGNOSIS — E1165 Type 2 diabetes mellitus with hyperglycemia: Secondary | ICD-10-CM

## 2020-01-15 DIAGNOSIS — IMO0002 Reserved for concepts with insufficient information to code with codable children: Secondary | ICD-10-CM

## 2020-01-15 DIAGNOSIS — E039 Hypothyroidism, unspecified: Secondary | ICD-10-CM

## 2020-01-15 DIAGNOSIS — E1142 Type 2 diabetes mellitus with diabetic polyneuropathy: Secondary | ICD-10-CM

## 2020-01-15 DIAGNOSIS — Z1211 Encounter for screening for malignant neoplasm of colon: Secondary | ICD-10-CM

## 2020-01-15 DIAGNOSIS — G4733 Obstructive sleep apnea (adult) (pediatric): Secondary | ICD-10-CM

## 2020-01-15 DIAGNOSIS — I10 Essential (primary) hypertension: Secondary | ICD-10-CM

## 2020-01-15 DIAGNOSIS — I5032 Chronic diastolic (congestive) heart failure: Secondary | ICD-10-CM

## 2020-01-15 DIAGNOSIS — I2510 Atherosclerotic heart disease of native coronary artery without angina pectoris: Secondary | ICD-10-CM

## 2020-01-15 DIAGNOSIS — Z09 Encounter for follow-up examination after completed treatment for conditions other than malignant neoplasm: Secondary | ICD-10-CM

## 2020-01-15 LAB — POCT GLYCOSYLATED HEMOGLOBIN (HGB A1C): HbA1c, POC (prediabetic range): 13.8 % — AB (ref 5.7–6.4)

## 2020-01-15 LAB — GLUCOSE, POCT (MANUAL RESULT ENTRY): POC Glucose: 277 mg/dl — AB (ref 70–99)

## 2020-01-15 MED ORDER — LANTUS SOLOSTAR 100 UNIT/ML ~~LOC~~ SOPN: 46.0000 [IU] | PEN_INJECTOR | Freq: Two times a day (BID) | SUBCUTANEOUS | 11 refills | Status: DC

## 2020-01-15 MED FILL — $LANTUS SOLOSTAR 100 UNITS/: 100 | 32 days supply | Qty: 30 | Fill #0

## 2020-01-15 NOTE — Patient Instructions (Signed)

## 2020-01-15 NOTE — Progress Notes (Signed)
Patient ID: Marc Walker, adult    DOB: Mar 09, 1971  MRN: [id number]  CC: Hospitalization Follow-up, Diabetes, and Hypertension   Subjective: Marc Walker is a 49 y.o. adult who presents for hosp f/u Marc Walker concerns today include:  Pt withhistory of CAD (8stents), dCHF, DM type II, HL, HTN, obesity, OSA on CPAP,formertobacco dependence, BPH, hypothyroid.  Pt hosp [facility] for 3 days earlier this mth.  Found to have NSTEMI. Cath revealed LAD 95% in-stent stenosis of mid segment.  1 DES placed. Pt dischg on 3 mth free med supply on all of his medicines.  Metorprolol changed from 50 mg BID to 100 mg once a day extended release.  Prior to this hosp, pt was out of meds x 2-3 mths due to limited finances.  Trying to get disability and Medicaid -taking meds consistently since dischg -no CP since hosp.  No use of SL Nitro -no SOB -some LE edema.  Unchg since hosp discharge. -using CPAP consistently  DIABETES TYPE 2 Last A1C:   Results for orders placed or performed in visit on 01/15/20  POCT glucose (manual entry)  Result Value Ref Range   POC Glucose 277 (A) 70 - 99 mg/dl  POCT glycosylated hemoglobin (Hb A1C)  Result Value Ref Range   Hemoglobin A1C     HbA1c POC (<> result, manual entry)     HbA1c, POC (prediabetic range) 13.8 (A) 5.7 - 6.4 %   HbA1c, POC (controlled diabetic range)      Med Adherence: pt reports dosage of both Lantus and novolog lowered on hosp dischg.  currently on Lantus 40 BID and Novolog 38 with meals. Also on Jardiance but out of it previously for 2 mths Medication side effects:   Yes     No Home Monitoring?   Yes  3-5 x a day Home glucose results range: 200 Diet Adherence: states he is doing better.  Clothes feel more loose. Exercise:  Yes     No Hypoglycemic episodes?:  Yes     No Numbness of the feet?  Yes from middle toe laterally on both feet Retinopathy hx?  Yes     No Last eye exam: over due.  Trying to get  sponsorship from [employer] again.  Comments:  Hypothyroid:  Had been out of Levothyroxine x 2 mths.  He was given a 52-month supply on recent hospital discharge.  On 275 mcg.  Seen my nurse practitioner 11/02/2019 with right facial droop.  Patient thought he had Bell's palsy but exam was not consistent with that.  He thinks he still has some drooping and that his nose on the right side does not flare as well as on the left.  Patient Active Problem List   Diagnosis Date Noted  . COVID-19 virus infection 06/18/2019  . ACS (acute coronary syndrome) (HCC) 03/25/2019  . NSTEMI (non-ST elevated myocardial infarction) (HCC) 03/25/2019  . Primary osteoarthritis of both hips 02/16/2019  . Dyspnea on exertion 12/04/2018  . CAD S/P multiple PCI's. patient reported 9 previous stents; 9/20 DES to RPAV-1 and DES to mid RCA -1; 3/17 DES to LAD and DES to mid RCA along with Angiosculpt balloon angioplas 08/09/2017  . Prolonged QT interval 05/14/2017  . GERD (gastroesophageal reflux disease) 05/13/2017  . Hypothyroidism   . Bilateral carpal tunnel syndrome 04/08/2017  . Chronic diastolic CHF (congestive heart failure) (HCC) 03/09/2017  . Erectile dysfunction 03/02/2017  . OSA on CPAP 07/06/2016  . Morbid obesity (HCC) 07/06/2016  . DM  type 2 with diabetic peripheral neuropathy (HCC) 12/16/2015  . Coronary artery disease of native artery of native heart with stable angina pectoris (HCC) 09/30/2015  . Essential hypertension 09/16/2015  . Hyperlipidemia      Current Outpatient Medications on File Prior to Visit  Medication Sig Dispense Refill  . metoprolol succinate (TOPROL-XL) 100 MG 24 hr tablet Take 1 tablet by mouth daily.    Marland Kitchen. aspirin 81 MG tablet Take 1 tablet (81 mg total) by mouth daily. 90 tablet 3  . cephALEXin (KEFLEX) 500 MG capsule Take 1 capsule (500 mg total) by mouth 4 (four) times daily. 28 capsule 0  . clopidogrel (PLAVIX) 75 MG tablet Take 1 tablet (75 mg total) by mouth daily. 90  tablet 0  . diclofenac Sodium (VOLTAREN) 1 % GEL Apply 2 g topically 4 (four) times daily as needed. 50 g 0  . empagliflozin (JARDIANCE) 10 MG TABS tablet Take 10 mg by mouth daily. 90 tablet 3  . ezetimibe (ZETIA) 10 MG tablet Take 1 tablet (10 mg total) by mouth daily. 30 tablet 11  . famotidine (PEPCID) 40 MG tablet Take 1 tablet (40 mg total) by mouth at bedtime. 90 tablet 0  . fenofibrate 160 MG tablet Take 1 tablet (160 mg total) by mouth daily. 60 tablet 2  . glucose blood test strip Use as instructed 100 each 12  . insulin lispro (HUMALOG) 100 UNIT/ML injection Inject 0.38 mLs (38 Units total) into the skin 3 (three) times daily before meals. 10 mL 11  . Insulin Pen Needle (PEN NEEDLES) 31G X 6 MM MISC Use as directed 100 each 6  . Insulin Syringes, Disposable, U-100 0.5 ML MISC Use as directed 100 each 11  . levothyroxine (SYNTHROID) 200 MCG tablet TAKE 1 TABLET (200 MCG TOTAL) BY MOUTH DAILY. (DOSE INCREASE) 90 tablet 3  . levothyroxine (SYNTHROID) 75 MCG tablet Take 1 tablet (75 mcg total) by mouth daily before breakfast. 90 tablet 3  . losartan (COZAAR) 25 MG tablet Take 1 tablet (25 mg total) by mouth daily. 60 tablet 2  . metFORMIN (GLUCOPHAGE-XR) 500 MG 24 hr tablet Take 1 tablet (500 mg total) by mouth 2 (two) times daily. 60 tablet 2  . nitroGLYCERIN (NITROSTAT) 0.4 MG SL tablet Place 1 tablet (0.4 mg total) under the tongue every 5 (five) minutes x 3 doses as needed for chest pain. 25 tablet 0  . PRESCRIPTION MEDICATION CPAP- At bedtime    . rosuvastatin (CRESTOR) 40 MG tablet Take 1 tablet (40 mg total) by mouth daily. 30 tablet 11  . sildenafil (VIAGRA) 50 MG tablet Take 50 mg by mouth 2 (two) times a week.    . TRUEplus Lancets 28G MISC Use as directed 100 each 12   No current facility-administered medications on file prior to visit.    Allergies  Allergen Reactions  . 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  Pt only wants  pain medicine if absolutely necessary.  . Levofloxacin Rash    Rash on back (not sun exposed)and hypersensitivity to skin on exposed skin/arm  . Nsaids Other (See Comments)    Avoid due to CKD    Social History   Socioeconomic History  . Marital status: Divorced    Spouse name: Not on file  . Number of children: Not on file  . Years of education: Not on file  . Highest education level: Not on file  Occupational History  .  Occupation: Company secretary  Tobacco Use  . Smoking status: Former Smoker    Types: Cigarettes    Quit date: 01/31/2017    Years since quitting: 2.9  . Smokeless tobacco: Never Used  . Tobacco comment: quit 01/2017  Vaping Use  . Vaping Use: Never used  Substance and Sexual Activity  . Alcohol use: No    Alcohol/week: 0.0 standard drinks  . Drug use: No  . Sexual activity: Not on file  Other Topics Concern  . Not on file  Social History Narrative   Adopted. Very little info on biological parents, ?diabetes.   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 Difficulty of Paying Living Expenses:   Food Insecurity:   . Worried About Programme researcher, broadcasting/film/video in the Last Year:   . Barista in the Last Year:   Transportation Needs:   . Freight forwarder (Medical):   Marland Kitchen Lack of Transportation (Non-Medical):   Physical Activity:   . Days of Exercise per Week:   . Minutes of Exercise per Session:   Stress:   . Feeling of Stress :   Social Connections:   . Frequency of Communication with Friends and Family:   . Frequency of Social Gatherings with Friends and Family:   . Attends Religious Services:   . Active Member of Clubs or Organizations:   . Attends Banker Meetings:   Marland Kitchen Marital Status:   Intimate Partner Violence:   . Fear of Current or Ex-Partner:   . Emotionally Abused:   Marland Kitchen Physically Abused:   . Sexually Abused:     Family History  Adopted: Yes  Problem Relation Age  of Onset  . Diabetes Maternal Grandmother     Past Surgical History:  Procedure Laterality Date  . APPENDECTOMY    . CARDIAC CATHETERIZATION N/A 09/16/2015   Procedure: Left Heart Cath and Coronary Angiography;  Surgeon: Runell Gess, MD;  Location: [facility] INVASIVE CV LAB;  Service: Cardiovascular;  Laterality: N/A;  . CARDIAC CATHETERIZATION N/A 09/16/2015   Procedure: Coronary Stent Intervention;  Surgeon: Runell Gess, MD;  Location: [facility] INVASIVE CV LAB;  Service: Cardiovascular;  Laterality: N/A;  . CORONARY STENT INTERVENTION N/A 03/27/2019   Procedure: CORONARY STENT INTERVENTION;  Surgeon: Corky Crafts, MD;  Location: [facility] INVASIVE CV LAB;  Service: Cardiovascular;  Laterality: N/A;  . CORONARY STENT PLACEMENT    . LEFT HEART CATH AND CORONARY ANGIOGRAPHY N/A 03/27/2019   Procedure: LEFT HEART CATH AND CORONARY ANGIOGRAPHY;  Surgeon: Corky Crafts, MD;  Location[facility] INVASIVE CV LAB;  Service: Cardiovascular;  Laterality: N/A;    ROS: Review of Systems Negative except as stated above  PHYSICAL EXAM: BP 132/80   Pulse 68   Resp 16   Wt 288 lb 12.8 oz (131 kg)   SpO2 98%   BMI 48.06 kg/m   Wt Readings from Last 3 Encounters:  01/15/20 288 lb 12.8 oz (131 kg)  12/17/19 282 lb (127.9 kg)  12/17/19 282 lb (127.9 kg)    Physical Exam  General appearance - alert, well appearing, obese middle-age Caucasian male and in no distress Mental status - normal mood, behavior, speech, dress, motor activity, and thought processes Eyes - pupils equal and reactive, extraocular eye movements intact Neck - supple, no significant adenopathy Chest - clear to auscultation, no wheezes, rales or rhonchi, symmetric air entry Heart - normal rate,  regular rhythm, normal S1, S2, no murmurs, rubs, clicks or gallops Neurological -no facial drooping noted at this time.  Nasolabial fold on both sides appear to be equal to me.  When asked to flare the nose, it flares on both sides.     Extremities -no lower extremity edema CMP Latest Ref Rng & Units 08/01/2019 07/19/2019 04/20/2019  Glucose 65 - 99 mg/dL - 74 443(X)  BUN 6 - 24 mg/dL - 54(M) 08(Q)  Creatinine 0.76 - 1.27 mg/dL - 7.61 9.50  Sodium 932 - 144 mmol/L - 140 138  Potassium 3.5 - 5.2 mmol/L - 3.7 4.6  Chloride 96 - 106 mmol/L - 100 106  CO2 20 - 29 mmol/L - 24 18(L)  Calcium 8.7 - 10.2 mg/dL - 9.8 9.9  Total Protein 6.0 - 8.5 g/dL 6.6 6.8 -  Total Bilirubin 0.0 - 1.2 mg/dL <6.7 <1.2 -  Alkaline Phos 39 - 117 IU/L 82 68 -  AST 0 - 40 IU/L 24 15 -  ALT 0 - 44 IU/L 45(H) 22 -   Lipid Panel     Component Value Date/Time   CHOL 586 (H) 11/24/2019 1531   TRIG 3,151 (HH) 11/24/2019 1531   HDL 17 (L) 11/24/2019 1531   CHOLHDL 34.5 (H) 11/24/2019 1531   CHOLHDL 6.6 03/26/2019 0345   VLDL UNABLE TO CALCULATE IF TRIGLYCERIDE OVER 400 mg/dL 45/80/9983 3825   LDLCALC Comment (A) 11/24/2019 1531   LDLDIRECT 143 (H) 11/02/2019 0910   LDLDIRECT 76.9 03/26/2019 0345    CBC    Component Value Date/Time   WBC 9.3 04/02/2019 1732   RBC 4.63 04/02/2019 1732   HGB 12.3 (L) 04/02/2019 1732   HGB 13.8 07/05/2018 1618   HCT 39.0 04/02/2019 1732   HCT 43.1 07/05/2018 1618   PLT 311 04/02/2019 1732   PLT 321 07/05/2018 1618   MCV 84.2 04/02/2019 1732   MCV 86 07/05/2018 1618   MCH 26.6 04/02/2019 1732   MCHC 31.5 04/02/2019 1732   RDW 15.9 (H) 04/02/2019 1732   RDW 14.5 07/05/2018 1618   LYMPHSABS 2.0 04/02/2019 1732   LYMPHSABS 5.7 (H) 07/05/2018 1618   MONOABS 0.7 04/02/2019 1732   EOSABS 0.4 04/02/2019 1732   EOSABS 0.9 (H) 07/05/2018 1618   BASOSABS 0.1 04/02/2019 1732   BASOSABS 0.1 07/05/2018 1618    ASSESSMENT AND PLAN: 1. Hospital discharge follow-up  2. Coronary artery disease involving native coronary artery of native heart without angina pectoris Patient with recent hospitalization at [facility] with non-ST elevation MI status post stent to LAD.  Patient to continue metoprolol, Crestor,  Jardiance, aspirin and Plavix.  I will try to get him back in with his cardiologist Dr. Tenny Craw. - Ambulatory referral to Cardiology - CBC - Basic metabolic panel  3. Uncontrolled type 2 diabetes mellitus with peripheral neuropathy (HCC) Not at goal.  I recommend increasing Lantus to 46 units twice a day.  Continue current dose of Humalog.  Dietary counseling given. - POCT glucose (manual entry) - POCT glycosylated hemoglobin (Hb A1C)  4. Morbid obesity (HCC) Dietary counseling given.  5. Essential hypertension Close to goal.  Continue current dose of metoprolol and Cozaar.  6. Chronic diastolic CHF (congestive heart failure) (HCC) Stable and not fluid overloaded at this time.  7. Hypothyroidism, unspecified type Continue levothyroxine.  We will plan to recheck TSH on follow-up visit as he just recently got back on the levothyroxine after being out of it for 2 to 3 months.  8. OSA on CPAP Continue use of CPAP consistently at bedtime  9. Screening for colon cancer - Fecal occult blood, imunochemical([facility]/Sun[facility])  10.  Exam at this time does not suggest CVA.  I told patient that this does not mean that he may not have had 1 in the past and we can do a CAT scan if he is still concerned.  Patient opted not to.   Patient was given the opportunity to ask questions.  Patient verbalized understanding of the plan and was able to repeat key elements of the plan.   Orders Placed This Encounter  Procedures  . Fecal occult blood, imunochemical([facility]/Sun[facility])  . CBC  . Basic metabolic panel  . Ambulatory referral to Cardiology  . POCT glucose (manual entry)  . POCT glycosylated hemoglobin (Hb A1C)     Requested Prescriptions   Signed Prescriptions Disp Refills  . insulin glargine (LANTUS SOLOSTAR) 100 UNIT/ML Solostar Pen 5 pen 11    Sig: Inject 46 Units into the skin 2 (two) times daily.    Return in about 3 months (around 04/16/2020).  Jonah Blue, MD, FACP

## 2020-01-16 LAB — BASIC METABOLIC PANEL
BUN/Creatinine Ratio: 30 — ABNORMAL HIGH (ref 9–20)
BUN: 21 mg/dL (ref 6–24)
CO2: 19 mmol/L — ABNORMAL LOW (ref 20–29)
Calcium: 9.7 mg/dL (ref 8.7–10.2)
Chloride: 102 mmol/L (ref 96–106)
Creatinine, Ser: 0.71 mg/dL — ABNORMAL LOW (ref 0.76–1.27)
GFR calc Af Amer: 126 mL/min/{1.73_m2} (ref >59)
GFR calc non Af Amer: 109 mL/min/{1.73_m2} (ref >59)
Glucose: 246 mg/dL — ABNORMAL HIGH (ref 65–99)
Potassium: 4.6 mmol/L (ref 3.5–5.2)
Sodium: 136 mmol/L (ref 134–144)

## 2020-01-16 LAB — CBC
Hematocrit: 40.4 % (ref 37.5–51.0)
Hemoglobin: 13 g/dL (ref 13.0–17.7)
MCH: 29.9 pg (ref 26.6–33.0)
MCHC: 32.2 g/dL (ref 31.5–35.7)
MCV: 93 fL (ref 79–97)
Platelets: 265 10*3/uL (ref 150–450)
RBC: 4.35 x10E6/uL (ref 4.14–5.80)
RDW: 13.7 % (ref 11.6–15.4)
WBC: 10.5 10*3/uL (ref 3.4–10.8)

## 2020-01-26 ENCOUNTER — Ambulatory Visit: Payer: Self-pay | Admitting: Internal Medicine

## 2020-02-13 ENCOUNTER — Encounter: Payer: Self-pay | Admitting: Internal Medicine

## 2020-03-07 ENCOUNTER — Ambulatory Visit: Payer: Self-pay | Admitting: Physician Assistant

## 2020-03-07 NOTE — Progress Notes (Deleted)
Cardiology Office Note    Date:  03/07/2020   ID:  Marc Walker, DOB Oct 15, 1970, MRN [id number]  PCP:  Marcine Matar, MD  Cardiologist:  Dr. Tenny Craw OSA: Dr. Mayford Knife   Chief Complaint: 4 Months follow up  History of Present Illness:   Marc Walker is a 49 y.o. male ***   Past Medical History:  Diagnosis Date  . Chronic diastolic CHF (congestive heart failure) (HCC) 03/09/2017   Echo 1/18: Mild LVH, EF 60-65, Gr 2 DD, mild LAE; difficult study-no obvious wall motion abnormalities with Definity  . CKD (chronic kidney disease)   . Coronary artery disease    a. s/pprior myocardial infarction, s/p multipleprior stents placedat outside institutions with last intervention 08/2015 at [facility].  . Diabetes mellitus (HCC)   . Drug addiction in remission (HCC)   . Erectile dysfunction 03/02/2017  . Hyperlipidemia associated with type 2 diabetes mellitus (HCC)   . Hypertension   . Hypothyroidism    a. adm with profound hypothyroidism near [facility] in 04/2017.  . MI (myocardial infarction) (HCC) 07/2009   [facility] in [place], [place] for last 2, 1st one in [place], [place]; total of 6 stents, last one 02/2014   . Morbid obesity (HCC) 07/06/2016  . Noncompliance with therapeutic plan    a. thyroid med -> profound hypothyroidism 04/2017.  Marland Kitchen OSA (obstructive sleep apnea) 07/06/2016   Moderate with AHI 21.8/hr by PSG 08/2012 now on CPAP  . Prolonged QT interval 05/14/2017  . Tobacco abuse   . Wide-complex tachycardia (HCC) 05/14/2017    Past Surgical History:  Procedure Laterality Date  . APPENDECTOMY    . CARDIAC CATHETERIZATION N/A 09/16/2015   Procedure: Left Heart Cath and Coronary Angiography;  Surgeon: Runell Gess, MD;  Location: [facility] INVASIVE CV LAB;  Service: Cardiovascular;  Laterality: N/A;  . CARDIAC CATHETERIZATION N/A 09/16/2015   Procedure: Coronary Stent Intervention;  Surgeon: Runell Gess, MD;  Location: [facility] INVASIVE CV LAB;  Service: Cardiovascular;  Laterality:  N/A;  . CORONARY STENT INTERVENTION N/A 03/27/2019   Procedure: CORONARY STENT INTERVENTION;  Surgeon: Corky Crafts, MD;  Location: [facility] INVASIVE CV LAB;  Service: Cardiovascular;  Laterality: N/A;  . CORONARY STENT PLACEMENT    . LEFT HEART CATH AND CORONARY ANGIOGRAPHY N/A 03/27/2019   Procedure: LEFT HEART CATH AND CORONARY ANGIOGRAPHY;  Surgeon: Corky Crafts, MD;  Location: [facility] INVASIVE CV LAB;  Service: Cardiovascular;  Laterality: N/A;    Current Medications: Prior to Admission medications   Medication Sig Start Date End Date Taking? Authorizing Provider  aspirin 81 MG tablet Take 1 tablet (81 mg total) by mouth daily. 11/17/16   Pete Glatter, MD  cephALEXin (KEFLEX) 500 MG capsule Take 1 capsule (500 mg total) by mouth 4 (four) times daily. 12/17/19   Pricilla Loveless, MD  clopidogrel (PLAVIX) 75 MG tablet Take 1 tablet (75 mg total) by mouth daily. 05/29/19   Pricilla Riffle, MD  diclofenac Sodium (VOLTAREN) 1 % GEL Apply 2 g topically 4 (four) times daily as needed. 08/08/19   Long, Arlyss Repress, MD  empagliflozin (JARDIANCE) 10 MG TABS tablet Take 10 mg by mouth daily. 07/19/19   Anders Simmonds, PA-C  ezetimibe (ZETIA) 10 MG tablet Take 1 tablet (10 mg total) by mouth daily. 07/19/19   Anders Simmonds, PA-C  famotidine (PEPCID) 40 MG tablet Take 1 tablet (40 mg total) by mouth at bedtime. 10/21/18   [name] T, PA-C  fenofibrate 160  MG tablet Take 1 tablet (160 mg total) by mouth daily. 03/30/19   Georgie Chard D, NP  glucose blood test strip Use as instructed 05/09/18   Marcine Matar, MD  insulin glargine (LANTUS SOLOSTAR) 100 UNIT/ML Solostar Pen Inject 46 Units into the skin 2 (two) times daily. 01/15/20   Marcine Matar, MD  insulin lispro (HUMALOG) 100 UNIT/ML injection Inject 0.38 mLs (38 Units total) into the skin 3 (three) times daily before meals. 07/19/19   Anders Simmonds, PA-C  Insulin Pen Needle (PEN NEEDLES) 31G X 6 MM MISC Use as directed 07/19/19    Anders Simmonds, PA-C  Insulin Syringes, Disposable, U-100 0.5 ML MISC Use as directed 06/11/17   Marcine Matar, MD  levothyroxine (SYNTHROID) 200 MCG tablet TAKE 1 TABLET (200 MCG TOTAL) BY MOUTH DAILY. (DOSE INCREASE) 12/13/19   Pricilla Riffle, MD  levothyroxine (SYNTHROID) 75 MCG tablet Take 1 tablet (75 mcg total) by mouth daily before breakfast. 12/13/19   Pricilla Riffle, MD  losartan (COZAAR) 25 MG tablet Take 1 tablet (25 mg total) by mouth daily. 07/19/19   Anders Simmonds, PA-C  metFORMIN (GLUCOPHAGE-XR) 500 MG 24 hr tablet Take 1 tablet (500 mg total) by mouth 2 (two) times daily. 07/19/19   Anders Simmonds, PA-C  metoprolol succinate (TOPROL-XL) 100 MG 24 hr tablet Take 1 tablet by mouth daily. 01/09/20 01/08/21  [provider]  nitroGLYCERIN (NITROSTAT) 0.4 MG SL tablet Place 1 tablet (0.4 mg total) under the tongue every 5 (five) minutes x 3 doses as needed for chest pain. 03/29/19   Filbert Schilder, NP  PRESCRIPTION MEDICATION CPAP- At bedtime    [provider]  rosuvastatin (CRESTOR) 40 MG tablet Take 1 tablet (40 mg total) by mouth daily. 07/19/19 07/18/20  Anders Simmonds, PA-C  sildenafil (VIAGRA) 50 MG tablet Take 50 mg by mouth 2 (two) times a week.    [provider]  TRUEplus Lancets 28G MISC Use as directed 12/08/18   Marcine Matar, MD    Allergies:   Other, Levofloxacin, and Nsaids   Social History   Socioeconomic History  . Marital status: Divorced    Spouse name: Not on file  . Number of children: Not on file  . Years of education: Not on file  . Highest education level: Not on file  Occupational History  . Occupation: Company secretary  Tobacco Use  . Smoking status: Former Smoker    Types: Cigarettes    Quit date: 01/31/2017    Years since quitting: 3.0  . Smokeless tobacco: Never Used  . Tobacco comment: quit 01/2017  Vaping Use  . Vaping Use: Never used  Substance and Sexual Activity  . Alcohol use: No    Alcohol/week:  0.0 standard drinks  . Drug use: No  . Sexual activity: Not on file  Other Topics Concern  . Not on file  Social History Narrative   Adopted. Very little info on biological parents, ?diabetes.   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 Difficulty of Paying Living Expenses: Not on file  Food Insecurity:   . Worried About Programme researcher, broadcasting/film/video in the Last Year: Not on file  . Ran Out of Food in the Last Year: Not on file  Transportation Needs:   . Lack of Transportation (Medical): Not on file  . Lack of Transportation (Non-Medical): Not on  file  Physical Activity:   . Days of Exercise per Week: Not on file  . Minutes of Exercise per Session: Not on file  Stress:   . Feeling of Stress : Not on file  Social Connections:   . Frequency of Communication with Friends and Family: Not on file  . Frequency of Social Gatherings with Friends and Family: Not on file  . Attends Religious Services: Not on file  . Active Member of Clubs or Organizations: Not on file  . Attends Banker Meetings: Not on file  . Marital Status: Not on file     Family History:  The patient's family history includes Diabetes in his maternal grandmother. He was adopted. ***  ROS:   Please see the history of present illness.    ROS All other systems reviewed and are negative.   PHYSICAL EXAM:   VS:  There were no vitals taken for this visit.   GEN: Well nourished, well developed, in no acute distress  HEENT: normal  Neck: no JVD, carotid bruits, or masses Cardiac: ***RRR; no murmurs, rubs, or gallops,no edema  Respiratory:  clear to auscultation bilaterally, normal work of breathing GI: soft, nontender, nondistended, + BS MS: no deformity or atrophy  Skin: warm and dry, no rash Neuro:  Alert and Oriented x 3, Strength and sensation are intact Psych: euthymic mood, full affect  Wt Readings from Last 3 Encounters:  01/15/20 288 lb 12.8 oz  (131 kg)  12/17/19 282 lb (127.9 kg)  12/17/19 282 lb (127.9 kg)      Studies/Labs Reviewed:   EKG:  EKG is ordered today.  The ekg ordered today demonstrates ***  Recent Labs: 08/01/2019: ALT 45 12/01/2019: TSH 42.000 01/15/2020: BUN 21; Creatinine, Ser 0.71; Hemoglobin 13.0; Platelets 265; Potassium 4.6; Sodium 136   Lipid Panel    Component Value Date/Time   CHOL 586 (H) 11/24/2019 1531   TRIG 3,151 (HH) 11/24/2019 1531   HDL 17 (L) 11/24/2019 1531   CHOLHDL 34.5 (H) 11/24/2019 1531   CHOLHDL 6.6 03/26/2019 0345   VLDL UNABLE TO CALCULATE IF TRIGLYCERIDE OVER 400 mg/dL 66/59/9357 0177   LDLCALC Comment (A) 11/24/2019 1531   LDLDIRECT 143 (H) 11/02/2019 0910   LDLDIRECT 76.9 03/26/2019 0345    Additional studies/ records that were reviewed today include:   Echocardiogram:  Cardiac Catheterization:     ASSESSMENT & PLAN:    1. ***    Medication Adjustments/Labs and Tests Ordered: Current medicines are reviewed at length with the patient today.  Concerns regarding medicines are outlined above.  Medication changes, Labs and Tests ordered today are listed in the Patient Instructions below. There are no Patient Instructions on file for this visit.   Vonzella Nipple [place], [place]  03/07/2020 8:20 AM    [facility] [address] [place], [place], [place]  [address] Phone: [phone]; Fax: [phone]

## 2020-03-13 ENCOUNTER — Encounter: Payer: Self-pay | Admitting: Internal Medicine

## 2020-03-27 ENCOUNTER — Encounter: Payer: Self-pay | Admitting: *Deleted

## 2020-03-27 DIAGNOSIS — Z006 Encounter for examination for normal comparison and control in clinical research program: Secondary | ICD-10-CM

## 2020-03-27 NOTE — Research (Signed)
AEGIS Visit 11 EOS - Phone call due to COVID-19 Precautions   [name] is doing well since his 01-06-2020 NSTEMI hospitalization at [facility] (this has been reported to both the study sponsor and the IRB). He has been going to the local [employer] 3 times a week for swimming; he reports that his weight has decreased from 316 to 265 lbs. He is trying to eat a heart healthy diet as much as possible and he is taking all of his medications according to directions.   This is his EOS visit with [place] and I thanked him for his participation. Patient states that he would be interested in other studies in the future.                                      "CONSENT"   YES     NO   Continuing further Investigational Product and study visits for follow-up? [x]  []   Continuing consent from future biomedical research [x]  []                                      "EVENTS"    YES     NO  AE   (IF YES SEE SOURCE) []  [x]   SAE  (IF YES SEE SOURCE) []  [x]   ENDPOINT   (IF YES SEE SOURCE) []  [x]   REVASCULARIZATION  (IF YES SEE SOURCE) []  [x]   AMPUTATION   (IF YES SEE SOURCE) []  [x]   TROPONIN'S  (IF YES SEE SOURCE) []  [x]    Lifestyle Adherence Assessment:    YES NO  Abstinence from smoking/remaining tobacco free x   Cardiac Diet x   Routine physical activity and/or cardiac rehabilitation x     Current Outpatient Medications:  .  aspirin 81 MG tablet, Take 1 tablet (81 mg total) by mouth daily., Disp: 90 tablet, Rfl: 3 .  cephALEXin (KEFLEX) 500 MG capsule, Take 1 capsule (500 mg total) by mouth 4 (four) times daily., Disp: 28 capsule, Rfl: 0 .  clopidogrel (PLAVIX) 75 MG tablet, Take 1 tablet (75 mg total) by mouth daily., Disp: 90 tablet, Rfl: 0 .  diclofenac Sodium (VOLTAREN) 1 % GEL, Apply 2 g topically 4 (four) times daily as needed., Disp: 50 g, Rfl: 0 .  empagliflozin (JARDIANCE) 10 MG TABS tablet, Take 10 mg by mouth daily., Disp: 90 tablet, Rfl: 3 .  ezetimibe (ZETIA) 10 MG tablet, Take 1 tablet  (10 mg total) by mouth daily., Disp: 30 tablet, Rfl: 11 .  famotidine (PEPCID) 40 MG tablet, Take 1 tablet (40 mg total) by mouth at bedtime., Disp: 90 tablet, Rfl: 0 .  fenofibrate 160 MG tablet, Take 1 tablet (160 mg total) by mouth daily., Disp: 60 tablet, Rfl: 2 .  glucose blood test strip, Use as instructed, Disp: 100 each, Rfl: 12 .  insulin glargine (LANTUS SOLOSTAR) 100 UNIT/ML Solostar Pen, Inject 46 Units into the skin 2 (two) times daily., Disp: 5 pen, Rfl: 11 .  insulin lispro (HUMALOG) 100 UNIT/ML injection, Inject 0.38 mLs (38 Units total) into the skin 3 (three) times daily before meals., Disp: 10 mL, Rfl: 11 .  Insulin Pen Needle (PEN NEEDLES) 31G X 6 MM MISC, Use as directed, Disp: 100 each, Rfl: 6 .  Insulin Syringes, Disposable, U-100 0.5 ML MISC, Use as directed, Disp:  100 each, Rfl: 11 .  levothyroxine (SYNTHROID) 200 MCG tablet, TAKE 1 TABLET (200 MCG TOTAL) BY MOUTH DAILY. (DOSE INCREASE), Disp: 90 tablet, Rfl: 3 .  levothyroxine (SYNTHROID) 75 MCG tablet, Take 1 tablet (75 mcg total) by mouth daily before breakfast., Disp: 90 tablet, Rfl: 3 .  losartan (COZAAR) 25 MG tablet, Take 1 tablet (25 mg total) by mouth daily., Disp: 60 tablet, Rfl: 2 .  metFORMIN (GLUCOPHAGE-XR) 500 MG 24 hr tablet, Take 1 tablet (500 mg total) by mouth 2 (two) times daily., Disp: 60 tablet, Rfl: 2 .  metoprolol succinate (TOPROL-XL) 100 MG 24 hr tablet, Take 1 tablet by mouth daily., Disp: , Rfl:  .  nitroGLYCERIN (NITROSTAT) 0.4 MG SL tablet, Place 1 tablet (0.4 mg total) under the tongue every 5 (five) minutes x 3 doses as needed for chest pain., Disp: 25 tablet, Rfl: 0 .  PRESCRIPTION MEDICATION, CPAP- At bedtime, Disp: , Rfl:  .  rosuvastatin (CRESTOR) 40 MG tablet, Take 1 tablet (40 mg total) by mouth daily., Disp: 30 tablet, Rfl: 11 .  sildenafil (VIAGRA) 50 MG tablet, Take 50 mg by mouth 2 (two) times a week., Disp: , Rfl:  .  TRUEplus Lancets 28G MISC, Use as directed, Disp: 100 each, Rfl:  12

## 2020-04-18 ENCOUNTER — Other Ambulatory Visit: Payer: Self-pay

## 2020-04-18 ENCOUNTER — Encounter: Payer: Self-pay | Admitting: Internal Medicine

## 2020-04-18 ENCOUNTER — Ambulatory Visit: Payer: Self-pay | Attending: Internal Medicine | Admitting: Internal Medicine

## 2020-04-18 VITALS — Wt 260.0 lb

## 2020-04-18 DIAGNOSIS — I10 Essential (primary) hypertension: Secondary | ICD-10-CM

## 2020-04-18 DIAGNOSIS — E1142 Type 2 diabetes mellitus with diabetic polyneuropathy: Secondary | ICD-10-CM

## 2020-04-18 DIAGNOSIS — E785 Hyperlipidemia, unspecified: Secondary | ICD-10-CM

## 2020-04-18 DIAGNOSIS — E1165 Type 2 diabetes mellitus with hyperglycemia: Secondary | ICD-10-CM

## 2020-04-18 DIAGNOSIS — R3589 Other polyuria: Secondary | ICD-10-CM

## 2020-04-18 DIAGNOSIS — R350 Frequency of micturition: Secondary | ICD-10-CM

## 2020-04-18 DIAGNOSIS — I2510 Atherosclerotic heart disease of native coronary artery without angina pectoris: Secondary | ICD-10-CM

## 2020-04-18 DIAGNOSIS — IMO0002 Reserved for concepts with insufficient information to code with codable children: Secondary | ICD-10-CM

## 2020-04-18 DIAGNOSIS — E039 Hypothyroidism, unspecified: Secondary | ICD-10-CM

## 2020-04-18 DIAGNOSIS — Z23 Encounter for immunization: Secondary | ICD-10-CM

## 2020-04-18 MED ORDER — EZETIMIBE 10 MG PO TABS: 10.0000 mg | ORAL_TABLET | Freq: Every day | ORAL | 11 refills | Status: AC

## 2020-04-18 MED ORDER — NITROGLYCERIN 0.4 MG SL SUBL: 0.4000 mg | SUBLINGUAL_TABLET | SUBLINGUAL | 0 refills | Status: AC | PRN

## 2020-04-18 MED ORDER — INSULIN LISPRO 100 UNIT/ML ~~LOC~~ SOLN: 38.0000 [IU] | Freq: Three times a day (TID) | SUBCUTANEOUS | 11 refills | Status: AC

## 2020-04-18 MED ORDER — LEVOTHYROXINE SODIUM 200 MCG PO TABS: ORAL_TABLET | ORAL | 6 refills | Status: AC

## 2020-04-18 MED ORDER — LOSARTAN POTASSIUM 25 MG PO TABS: 25.0000 mg | ORAL_TABLET | Freq: Every day | ORAL | 6 refills | Status: AC

## 2020-04-18 MED ORDER — TAMSULOSIN HCL 0.4 MG PO CAPS: 0.4000 mg | ORAL_CAPSULE | Freq: Every day | ORAL | 6 refills | Status: AC

## 2020-04-18 MED ORDER — LEVOTHYROXINE SODIUM 75 MCG PO TABS: 75.0000 ug | ORAL_TABLET | Freq: Every day | ORAL | 6 refills | Status: AC

## 2020-04-18 MED ORDER — LANTUS SOLOSTAR 100 UNIT/ML ~~LOC~~ SOPN: 52.0000 [IU] | PEN_INJECTOR | Freq: Two times a day (BID) | SUBCUTANEOUS | 6 refills | Status: AC

## 2020-04-18 MED ORDER — CLOPIDOGREL BISULFATE 75 MG PO TABS: 75.0000 mg | ORAL_TABLET | Freq: Every day | ORAL | 3 refills | Status: AC

## 2020-04-18 MED ORDER — EMPAGLIFLOZIN 10 MG PO TABS: 10.0000 mg | ORAL_TABLET | Freq: Every day | ORAL | 3 refills | Status: AC

## 2020-04-18 MED ORDER — ROSUVASTATIN CALCIUM 40 MG PO TABS: 40.0000 mg | ORAL_TABLET | Freq: Every day | ORAL | 11 refills | Status: AC

## 2020-04-18 MED ORDER — METOPROLOL SUCCINATE ER 100 MG PO TB24: 100.0000 mg | ORAL_TABLET | Freq: Every day | ORAL | 11 refills | Status: AC

## 2020-04-18 NOTE — Progress Notes (Signed)
Virtual Visit via Telephone Note Due to current restrictions/limitations of in-office visits due to the COVID-19 pandemic, this scheduled clinical appointment was converted to a telehealth visit  I connected with Marc Walker on 04/18/20 at 12:16 p.m by telephone and verified that I am speaking with the correct person using two identifiers.  Location: Patient: home Provider: in my office CMA Pollock:  office   I discussed the limitations, risks, security and privacy concerns of performing an evaluation and management service by telephone and the availability of in person appointments. I also discussed with the patient that there may be a patient responsible charge related to this service. The patient expressed understanding and agreed to proceed.   History of Present Illness: Pt withhistory of CAD (multiplestents), dCHF, DM type II, HL, HTN, obesity, OSA on CPAP,formertobacco dependence, BPH, hypothyroid.Last seen 12/2019.  This is for chronic ds management  CAD/dCHF: doing a lot more walking working at [employer] and [employer].  He drives the bus  No further issues with CP or SOB No recent SL Nitro use Reports compliance with Jardiance, Zetia, Crestor, Plavix/ASA and Metoprolol.  Request all meds including insulin  be sent to [facility].  He is in a free program there.  DIABETES TYPE 2 Last A1C:   Lab Results  Component Value Date   HGBA1C 13.8 (A) 01/15/2020   Med Adherence:  [x]  Yes.  Lantus increase to 46 units BID on last visit and RF given on jardiance.  Taking Humalog with meals 38 units.  Taking Metformin only once a day because BID dosing causes diarrhea.  Medication side effects:  [x]  Yes     Diarrhea with Metformin dose  More than 500 mg a day Home Monitoring?  [x]  Yes    []  No Home glucose results range: low 200s before meals Diet Adherence:  Not eating as much, often leaves food on his plate Exercise: moving more working at and [employer] Hypoglycemic episodes?: []  Yes    [x]  No Numbness of the feet? []  Yes    []  No Retinopathy hx? []  Yes    []  No Last eye exam: has appt 04/20/2020 at [facility].   Comments:   Having problems with frequent urination.  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  Has episodes of incontinence.  He stopped Furosemide about 1 yr ago because it made him go to bathroom all through the night and urinating a lot with out it Wakes up Q1-2 hrs to urinate at nights.   -gets urgency and can not hold urine.  No dysuria.  Good flow  Thyroid: taking Levothyroxine consistently.   Outpatient Encounter Medications as of 04/18/2020  Medication Sig  . aspirin 81 MG tablet Take 1 tablet (81 mg total) by mouth daily.  . cephALEXin (KEFLEX) 500 MG capsule Take 1 capsule (500 mg total) by mouth 4 (four) times daily.  . clopidogrel (PLAVIX) 75 MG tablet Take 1 tablet (75 mg total) by mouth daily.  . diclofenac Sodium (VOLTAREN) 1 % GEL Apply 2 g topically 4 (four) times daily as needed.  . empagliflozin (JARDIANCE) 10 MG TABS tablet Take 10 mg by mouth daily.  ezetimibe (ZETIA) 10 MG tablet Take 1 tablet (10 mg total) by mouth daily.  . famotidine (PEPCID) 40 MG tablet Take 1 tablet (40 mg total) by mouth at bedtime.  . fenofibrate 160 MG tablet Take 1 tablet (160 mg total)  by mouth daily.  Marland Kitchen glucose blood test strip Use as instructed  . insulin glargine (LANTUS SOLOSTAR) 100 UNIT/ML Solostar Pen Inject 46 Units into the skin 2 (two) times daily.  . insulin lispro (HUMALOG) 100 UNIT/ML injection Inject 0.38 mLs (38 Units total) into the skin 3 (three) times daily before meals.  . Insulin Pen Needle (PEN NEEDLES) 31G X 6 MM MISC Use as directed  . Insulin Syringes, Disposable, U-100 0.5 ML MISC Use as directed  . levothyroxine (SYNTHROID) 200 MCG tablet TAKE 1 TABLET (200 MCG TOTAL) BY MOUTH DAILY. (DOSE INCREASE)  . levothyroxine (SYNTHROID) 75 MCG tablet Take 1 tablet (75 mcg  total) by mouth daily before breakfast.  . losartan (COZAAR) 25 MG tablet Take 1 tablet (25 mg total) by mouth daily.  . metFORMIN (GLUCOPHAGE-XR) 500 MG 24 hr tablet Take 1 tablet (500 mg total) by mouth 2 (two) times daily.  . metoprolol succinate (TOPROL-XL) 100 MG 24 hr tablet Take 1 tablet by mouth daily.  . nitroGLYCERIN (NITROSTAT) 0.4 MG SL tablet Place 1 tablet (0.4 mg total) under the tongue every 5 (five) minutes x 3 doses as needed for chest pain.  Marland Kitchen PRESCRIPTION MEDICATION CPAP- At bedtime  . rosuvastatin (CRESTOR) 40 MG tablet Take 1 tablet (40 mg total) by mouth daily.  . sildenafil (VIAGRA) 50 MG tablet Take 50 mg by mouth 2 (two) times a week.  . TRUEplus Lancets 28G MISC Use as directed   No facility-administered encounter medications on file as of 04/18/2020.      Observations/Objective: Vitals with BMI 04/18/2020 01/15/2020 01/15/2020  Height - - -  Weight 260 lbs - 288 lbs 13 oz  BMI - - -  Systolic - 132 149  Diastolic - 80 90  Pulse - - 68   Wt Readings from Last 3 Encounters:  04/18/20 260 lb (117.9 kg)  01/15/20 288 lb 12.8 oz (131 kg)  12/17/19 282 lb (127.9 kg)     Assessment and Plan: 1. Uncontrolled type 2 diabetes mellitus with peripheral neuropathy (HCC) Not at goal.  Recommend increase Lantus to 52 units twice a day.  Continue Humalog and Jardiance.  Change Metformin to 500 mg once a day since he is not able to tolerate twice a day dosing.  Discussed and encourage healthy eating habits. - Hemoglobin A1c; Future - empagliflozin (JARDIANCE) 10 MG TABS tablet; Take 1 tablet (10 mg total) by mouth daily.  Dispense: 90 tablet; Refill: 3 - insulin lispro (HUMALOG) 100 UNIT/ML injection; Inject 0.38 mLs (38 Units total) into the skin 3 (three) times daily before meals.  Dispense: 10 mL; Refill: 11 - insulin glargine (LANTUS SOLOSTAR) 100 UNIT/ML Solostar Pen; Inject 52 Units into the skin 2 (two) times daily.  Dispense: 30 mL; Refill: 6  2. Essential  hypertension Continue medications and low-salt diet - metoprolol succinate (TOPROL-XL) 100 MG 24 hr tablet; Take 1 tablet (100 mg total) by mouth daily.  Dispense: 30 tablet; Refill: 11 - losartan (COZAAR) 25 MG tablet; Take 1 tablet (25 mg total) by mouth daily.  Dispense: 60 tablet; Refill: 6  3. Morbid obesity (HCC) Commended him on moving more.  Encouraged him to continue to do so and trying to eat healthy.  Commended him on 28 pound weight loss since last visit.  I wonder however whether this is weight loss associated with uncontrolled diabetes.  4. Coronary artery disease involving native coronary artery of native heart without angina pectoris Patient with significant history and has had  more than 8 stents.  At this time he seems clinically stable.  Encouraged him to continue his current medications including statin therapy, beta-blocker, Plavix and aspirin - nitroGLYCERIN (NITROSTAT) 0.4 MG SL tablet; Place 1 tablet (0.4 mg total) under the tongue every 5 (five) minutes x 3 doses as needed for chest pain.  Dispense: 25 tablet; Refill: 0  5. Frequency of urination and polyuria Differential diagnoses include BPH, urge incontinence versus due to uncontrolled diabetes.  He will come to the lab to have a urinalysis done.  I will try him with a low dose of Flomax.  He reportedly is not taking furosemide anymore so this is not playing a role.  6. Hyperlipidemia, unspecified hyperlipidemia type - ezetimibe (ZETIA) 10 MG tablet; Take 1 tablet (10 mg total) by mouth daily.  Dispense: 30 tablet; Refill: 11 - rosuvastatin (CRESTOR) 40 MG tablet; Take 1 tablet (40 mg total) by mouth daily.  Dispense: 30 tablet; Refill: 11  7. Acquired hypothyroidism - TSH+T4F+T3Free; Future - tamsulosin (FLOMAX) 0.4 MG CAPS capsule; Take 1 capsule (0.4 mg total) by mouth daily.  Dispense: 30 capsule; Refill: 6 - Urinalysis, Routine w reflex microscopic; Future - levothyroxine (SYNTHROID) 75 MCG tablet; Take 1 tablet  (75 mcg total) by mouth daily before breakfast.  Dispense: 30 tablet; Refill: 6 - levothyroxine (SYNTHROID) 200 MCG tablet; TAKE 1 TABLET (200 MCG TOTAL) BY MOUTH DAILY. (DOSE INCREASE)  Dispense: 30 tablet; Refill: 6  8. Need for influenza vaccination Patient will come next week to get flu shot from our clinical pharmacist.   Follow Up Instructions: F/u 3-4 mths   I discussed the assessment and treatment plan with the patient. The patient was provided an opportunity to ask questions and all were answered. The patient agreed with the plan and demonstrated an understanding of the instructions.   The patient was advised to call back or seek an in-person evaluation if the symptoms worsen or if the condition fails to improve as anticipated.  I provided 23 minutes of non-face-to-face time during this encounter.   Jonah Blue, MD

## 2020-04-19 ENCOUNTER — Telehealth: Payer: Self-pay | Admitting: Internal Medicine

## 2020-04-19 NOTE — Telephone Encounter (Signed)
Copied from CRM [id number]. Topic: General - Other >> Apr 19, 2020  2:13 PM Jaquita Rector A wrote: Reason for CRM: Patient called in to ask Dr Laural Benes if she can please prescribe something stronger than the sildenafil (VIAGRA) 50 MG tablet. States that this is not strong enough please advise Ph# [phone]

## 2020-04-22 ENCOUNTER — Other Ambulatory Visit: Payer: Self-pay

## 2020-04-22 ENCOUNTER — Ambulatory Visit: Payer: Self-pay | Attending: Internal Medicine | Admitting: Pharmacist

## 2020-04-22 ENCOUNTER — Ambulatory Visit: Payer: Self-pay | Admitting: Pharmacist

## 2020-04-22 DIAGNOSIS — Z23 Encounter for immunization: Secondary | ICD-10-CM

## 2020-04-22 DIAGNOSIS — E1165 Type 2 diabetes mellitus with hyperglycemia: Secondary | ICD-10-CM

## 2020-04-22 DIAGNOSIS — E039 Hypothyroidism, unspecified: Secondary | ICD-10-CM

## 2020-04-22 DIAGNOSIS — IMO0002 Reserved for concepts with insufficient information to code with codable children: Secondary | ICD-10-CM

## 2020-04-22 DIAGNOSIS — E1142 Type 2 diabetes mellitus with diabetic polyneuropathy: Secondary | ICD-10-CM

## 2020-04-22 NOTE — Progress Notes (Signed)
Patient presents for vaccination against influenza per orders of Dr. Johnson. Consent given. Counseling provided. No contraindications exists. Vaccine administered without incident.  ° °Luke Van Ausdall, PharmD, CPP °Clinical Pharmacist °[facility] °[phone] ° °

## 2020-04-22 NOTE — Telephone Encounter (Signed)
Pt came by the office today and made pt aware of Dr. Laural Benes response. Pt states he will send his cardiologist a message

## 2020-04-23 LAB — URINALYSIS, ROUTINE W REFLEX MICROSCOPIC
Bilirubin, UA: NEGATIVE
Ketones, UA: NEGATIVE
Leukocytes,UA: NEGATIVE
Nitrite, UA: NEGATIVE
RBC, UA: NEGATIVE
Specific Gravity, UA: >=1.03 — AB (ref 1.005–1.030)
Urobilinogen, Ur: 0.2 mg/dL (ref 0.2–1.0)
pH, UA: 6 (ref 5.0–7.5)

## 2020-04-23 LAB — TSH+T4F+T3FREE
Free T4: 1.37 ng/dL (ref 0.82–1.77)
T3, Free: 1.8 pg/mL — ABNORMAL LOW (ref 2.0–4.4)
TSH: 1.64 u[IU]/mL (ref 0.450–4.500)

## 2020-04-23 LAB — HEMOGLOBIN A1C
Est. average glucose Bld gHb Est-mCnc: >398 mg/dL
Hgb A1c MFr Bld: >15.5 % — ABNORMAL HIGH (ref 4.8–5.6)

## 2020-04-24 ENCOUNTER — Telehealth: Payer: Self-pay

## 2020-04-24 NOTE — Telephone Encounter (Signed)
Contacted pt to go over lab results pt states he seen his results on Mychart. Pt is scheduled to see luke next Wednesday at [facility]

## 2020-05-01 ENCOUNTER — Ambulatory Visit: Payer: Self-pay | Admitting: Pharmacist

## 2020-05-03 ENCOUNTER — Telehealth: Payer: Self-pay | Admitting: Internal Medicine

## 2020-05-03 NOTE — Telephone Encounter (Signed)
Pt called in and stated has been going back and forth with Michalene on mychart.  He would like to know if he could speak with her instead of talking though mychart

## 2020-05-06 ENCOUNTER — Telehealth: Payer: Self-pay | Admitting: Internal Medicine

## 2020-05-06 DIAGNOSIS — N5089 Other specified disorders of the male genital organs: Secondary | ICD-10-CM

## 2020-05-06 NOTE — Telephone Encounter (Signed)
Spoke to patient   He is having problems with swelling around swelling and penis is difficult to handle  Had wanted sildenifil for this   He says ankle swelling comes and goes    Recomm: He needs appt at [facility] to get evaluated Also should have f/u in cardiology Rx with sildenafil is not the answer  Pt is agreeable

## 2020-05-07 NOTE — Telephone Encounter (Signed)
Referral placed to [facility]. 

## 2020-05-07 NOTE — Addendum Note (Signed)
Addended by: Lendon Ka on: 05/07/2020 12:39 PM   Modules accepted: Orders

## 2020-05-16 ENCOUNTER — Encounter: Payer: Self-pay | Admitting: Internal Medicine

## 2020-05-28 ENCOUNTER — Encounter: Payer: Self-pay | Admitting: Internal Medicine

## 2020-05-28 DIAGNOSIS — Z111 Encounter for screening for respiratory tuberculosis: Secondary | ICD-10-CM

## 2020-06-05 ENCOUNTER — Ambulatory Visit: Payer: Self-pay | Admitting: Internal Medicine

## 2020-06-11 ENCOUNTER — Encounter: Payer: Self-pay | Admitting: Internal Medicine

## 2020-07-07 ENCOUNTER — Encounter: Payer: Self-pay | Admitting: Internal Medicine

## 2020-07-08 ENCOUNTER — Other Ambulatory Visit: Payer: Self-pay

## 2020-07-08 DIAGNOSIS — Z20822 Contact with and (suspected) exposure to covid-19: Secondary | ICD-10-CM

## 2020-07-10 LAB — SARS-COV-2, NAA 2 DAY TAT

## 2020-07-10 LAB — NOVEL CORONAVIRUS, NAA: SARS-CoV-2, NAA: NOT DETECTED

## 2020-07-23 ENCOUNTER — Other Ambulatory Visit: Payer: Self-pay | Admitting: Internal Medicine

## 2020-07-23 DIAGNOSIS — I2510 Atherosclerotic heart disease of native coronary artery without angina pectoris: Secondary | ICD-10-CM
